# Patient Record
Sex: Female | Born: 1950
Health system: Southern US, Community
[De-identification: ages and names within clinical notes are randomized; demographics above are authoritative.]

## PROBLEM LIST (undated history)

## (undated) DIAGNOSIS — C801 Malignant (primary) neoplasm, unspecified: Secondary | ICD-10-CM

## (undated) DIAGNOSIS — J45909 Unspecified asthma, uncomplicated: Secondary | ICD-10-CM

## (undated) DIAGNOSIS — I10 Essential (primary) hypertension: Secondary | ICD-10-CM

## (undated) DIAGNOSIS — M1990 Unspecified osteoarthritis, unspecified site: Secondary | ICD-10-CM

## (undated) DIAGNOSIS — F419 Anxiety disorder, unspecified: Secondary | ICD-10-CM

## (undated) DIAGNOSIS — M87073 Idiopathic aseptic necrosis of unspecified ankle: Secondary | ICD-10-CM

## (undated) DIAGNOSIS — Z8701 Personal history of pneumonia (recurrent): Secondary | ICD-10-CM

## (undated) DIAGNOSIS — R269 Unspecified abnormalities of gait and mobility: Secondary | ICD-10-CM

## (undated) DIAGNOSIS — K219 Gastro-esophageal reflux disease without esophagitis: Secondary | ICD-10-CM

## (undated) DIAGNOSIS — H9190 Unspecified hearing loss, unspecified ear: Secondary | ICD-10-CM

## (undated) DIAGNOSIS — R600 Localized edema: Secondary | ICD-10-CM

## (undated) DIAGNOSIS — R413 Other amnesia: Secondary | ICD-10-CM

## (undated) DIAGNOSIS — R112 Nausea with vomiting, unspecified: Secondary | ICD-10-CM

## (undated) DIAGNOSIS — E785 Hyperlipidemia, unspecified: Secondary | ICD-10-CM

## (undated) DIAGNOSIS — N393 Stress incontinence (female) (male): Secondary | ICD-10-CM

## (undated) DIAGNOSIS — F32A Depression, unspecified: Secondary | ICD-10-CM

## (undated) DIAGNOSIS — Z9889 Other specified postprocedural states: Secondary | ICD-10-CM

## (undated) DIAGNOSIS — F329 Major depressive disorder, single episode, unspecified: Secondary | ICD-10-CM

## (undated) HISTORY — DX: Localized edema: R60.0

## (undated) HISTORY — PX: BELOW KNEE LEG AMPUTATION: SUR23

## (undated) HISTORY — DX: Hyperlipidemia, unspecified: E78.5

## (undated) HISTORY — DX: Unspecified abnormalities of gait and mobility: R26.9

## (undated) HISTORY — PX: CATARACT EXTRACTION W/ INTRAOCULAR LENS IMPLANT: SHX1309

## (undated) HISTORY — PX: TONSILLECTOMY: SUR1361

## (undated) HISTORY — DX: Other amnesia: R41.3

## (undated) HISTORY — PX: SKIN GRAFT: SHX250

## (undated) HISTORY — PX: ANKLE FUSION: SHX881

---

## 1981-06-06 HISTORY — PX: TUBAL LIGATION: SHX77

## 2008-06-06 HISTORY — PX: ANKLE SURGERY: SHX546

## 2009-06-06 HISTORY — PX: ANKLE FUSION: SHX881

## 2010-09-08 ENCOUNTER — Ambulatory Visit (INDEPENDENT_AMBULATORY_CARE_PROVIDER_SITE_OTHER): Payer: PRIVATE HEALTH INSURANCE | Admitting: Cardiovascular Disease

## 2010-09-08 ENCOUNTER — Encounter: Payer: Self-pay | Admitting: Cardiovascular Disease

## 2010-09-08 VITALS — BP 102/74 | HR 88 | Resp 18 | Ht 68.0 in | Wt 195.0 lb

## 2010-09-08 DIAGNOSIS — R609 Edema, unspecified: Secondary | ICD-10-CM

## 2010-09-08 DIAGNOSIS — R600 Localized edema: Secondary | ICD-10-CM

## 2010-09-08 NOTE — Assessment & Plan Note (Signed)
The patient has asymmetric lower extremity edema involving only the left leg. I think the most likely etiology is either venous insufficiency or chronic DVT following surgery. The patient does not recall a specific event where she had left calf pain, but chronic DVT is possible considering her previous operations. Will check a left leg venous duplex scan to evaluate for both disorders. Since she has some systemic symptoms including diffuse joint aches and rash, will check a sed rate, ANA, and rheumatoid factor. We'll followup with the patient after the studies are completed. If she has venous insufficiency we'll likely recommend leg elevation and compression stockings.

## 2010-09-08 NOTE — Patient Instructions (Signed)
Your physician recommends that you schedule a follow-up appointment as needed. Your physician has requested that you have a lower or upper extremity venous duplex. This test is an ultrasound of the veins in the legs or arms. It looks at venous blood flow that carries blood from the heart to the legs or arms. Allow one hour for a Lower Venous exam. Allow thirty minutes for an Upper Venous exam. There are no restrictions or special instructions.

## 2010-09-08 NOTE — Progress Notes (Signed)
HPI:  This is a 60 year old woman presented for initial evaluation of left leg swelling. The patient first noted left leg swelling in 2010 following surgery on her right ankle. She has had mild chronic swelling of the left leg since that time. However, she notes progressive swelling of the left lower leg over the past 2 months. She has also developed a pruritic rash limited to the left lower leg.  She has been diagnosed with avascular necrosis of the right ankle has undergone 2 surgeries, first in 2010 and then a second surgery in 2011. She is going to require a third surgery in the near future. She has had some mild swelling of the right ankle around the time of surgery but otherwise has not had significant swelling in the right leg. The patient has not undergone any evaluation to this point for her left leg swelling. She denies pain in the left leg. She specifically denies calf pain with ambulation. She denies any swelling above the knee. She denies shortness of breath, orthopnea, or PND. She has had no chest pain. Her only other complaint is that of diffuse aches and pains. She denies fevers, chills, anorexia, or weight loss. She has had some night sweats but attributes this to discontinuation of hormone replacement therapy.  Outpatient Encounter Prescriptions as of 09/08/2010  Medication Sig Dispense Refill  . ascorbic acid (VITAMIN C) 500 MG tablet Take 1,000 mg by mouth daily.        Marland Kitchen buPROPion (WELLBUTRIN XL) 300 MG 24 hr tablet Take 300 mg by mouth daily.        . Calcium Carbonate-Vitamin D (RA CALCIUM PLUS VITAMIN D) 600-400 MG-UNIT per tablet Take 1 tablet by mouth 2 (two) times daily.        . CASCARA SAGRADA PO Take 2 capsules by mouth daily.        . cetirizine (ZYRTEC) 10 MG chewable tablet Chew 10 mg by mouth daily.        . Ergocalciferol (VITAMIN D2 PO) Take by mouth. Take 1.25mg  (50k UN) once a week       . folic acid (FOLVITE) 800 MCG tablet Take 800 mcg by mouth daily.        Marland Kitchen  L-Arginine 1000 MG TABS Take 2 tablets by mouth daily.        Marland Kitchen L-Lysine 1000 MG TABS Take 1 tablet by mouth daily.        . montelukast (SINGULAIR) 10 MG tablet Take 10 mg by mouth as needed.        . Multiple Vitamins-Minerals (CENTRUM SILVER PO) Take 1 tablet by mouth daily.        . Omega-3 Fatty Acids (FISH OIL BURP-LESS) 1200 MG CAPS Take 2 capsules by mouth daily.        . Potassium 99 MG TABS Take 1 tablet by mouth daily.        . simvastatin (ZOCOR) 20 MG tablet Take 20 mg by mouth at bedtime.        . vitamin B-12 (CYANOCOBALAMIN) 1000 MCG tablet Take 1,000 mcg by mouth daily.          Review of patient's allergies indicates no known allergies.  Past Medical History  Diagnosis Date  . Hyperlipidemia   . Leg edema     left  . Edema of foot     bilateral    Past Surgical History  Procedure Date  . Tonsillectomy   . Tubal ligation 1983  . Ankle  surgery 2010    right cordicompression  . Ankle fusion 2011    right    History   Social History  . Marital Status: Divorced    Spouse Name: N/A    Number of Children: 3  . Years of Education: N/A   Occupational History  . ACCOUNTING    Social History Main Topics  . Smoking status: Former Smoker    Types: Cigarettes    Quit date: 02/05/2007  . Smokeless tobacco: Not on file  . Alcohol Use: No  . Drug Use: No  . Sexually Active: Not on file   Other Topics Concern  . Not on file   Social History Narrative  . No narrative on file    Family History  Problem Relation Age of Onset  . Dementia Mother 76    alive  . Fibromyalgia Mother   . Heart attack Father 46    deceased  . Other Brother     alive  . Heart murmur Sister 14    she had open heart surgery  . Other Sister 3    She is bypolar- diabetic    ROS: General: no fevers/chills, positive for night sweats Eyes: no blurry vision, diplopia, or amaurosis ENT: no sore throat or hearing loss Resp: no cough, wheezing, or hemoptysis CV: no edema or  palpitations GI: no abdominal pain, nausea, vomiting, diarrhea, or constipation GU: no dysuria, frequency, or hematuria Skin: positive for left leg rash Neuro: no headache, numbness, tingling, or weakness of extremities Musculoskeletal: positive for joint pains Heme: no bleeding, DVT, or easy bruising Endo: no polydipsia or polyuria  BP 102/74  Pulse 88  Resp 18  Ht 5\' 8"  (1.727 m)  Wt 195 lb (88.451 kg)  BMI 29.65 kg/m2  PHYSICAL EXAM: Pt is alert and oriented, very pleasant woman, WD, WN, in no distress. HEENT: normal Neck: JVP normal. Carotid upstrokes normal without bruits. No thyromegaly. Negative HJR Lungs: equal expansion, clear bilaterally CV: Apex is discrete and nondisplaced, RRR without murmur or gallop Abd: soft, NT, +BS, no bruit, no hepatosplenomegaly Back: no CVA tenderness Ext: There is 2+ pitting edema of the left pretibial region and ankle. The right leg has no significant edema. There is muscle wasting of the right calf.        Femoral pulses 2+= without bruits        DP/PT pulses intact and = Skin: there are well-circumscribed areas of rash on the left lower extremity below the knee without surrounding erythema Neuro: CNII-XII intact             Strength intact = bilaterally  LABS: White blood cell count 5.7, hemoglobin 13.7, platelets 247, differential is normal, glucose 83 creatinine 0.8, serum albumin 4.3, liver function testing is normal  ASSESSMENT AND PLAN:

## 2010-09-09 LAB — URINALYSIS, ROUTINE W REFLEX MICROSCOPIC
Bilirubin Urine: NEGATIVE
Ketones, ur: NEGATIVE
Nitrite: NEGATIVE
Specific Gravity, Urine: 1.01 (ref 1.000–1.030)
Total Protein, Urine: NEGATIVE
Urine Glucose: NEGATIVE
Urobilinogen, UA: 0.2 (ref 0.0–1.0)
pH: 5.5 (ref 5.0–8.0)

## 2010-09-09 LAB — ANA: Anti Nuclear Antibody(ANA): NEGATIVE

## 2010-09-09 LAB — BRAIN NATRIURETIC PEPTIDE: Pro B Natriuretic peptide (BNP): 9.7 pg/mL (ref 0.0–100.0)

## 2010-09-09 LAB — RHEUMATOID FACTOR: Rhuematoid fact SerPl-aCnc: <10 IU/mL (ref <=14)

## 2010-09-09 LAB — SEDIMENTATION RATE: Sed Rate: 10 mm/hr (ref 0–22)

## 2010-09-15 ENCOUNTER — Encounter (INDEPENDENT_AMBULATORY_CARE_PROVIDER_SITE_OTHER): Payer: PRIVATE HEALTH INSURANCE | Admitting: *Deleted

## 2010-09-15 DIAGNOSIS — R600 Localized edema: Secondary | ICD-10-CM

## 2010-09-15 DIAGNOSIS — M79609 Pain in unspecified limb: Secondary | ICD-10-CM

## 2010-09-15 DIAGNOSIS — M7989 Other specified soft tissue disorders: Secondary | ICD-10-CM

## 2010-09-20 ENCOUNTER — Encounter: Payer: Self-pay | Admitting: Cardiovascular Disease

## 2010-09-20 ENCOUNTER — Telehealth: Payer: Self-pay | Admitting: Cardiovascular Disease

## 2010-09-20 NOTE — Telephone Encounter (Signed)
Pt is at low risk of cardiovascular problems with further orthopedic surgeries and does not require further testing at this time.

## 2010-09-20 NOTE — Telephone Encounter (Signed)
I spoke with the Shelly [name] and made her aware of labs, UA and LEV results.  The Shelly [name] would like a copy of these tests mailed to her home.  The Shelly [name] said she is also due for additional surgery with Dr Hillery Hunter at [facility] and the Shelly [name]'s needs Dr Excell Seltzer to give her clearance.  I will forward this message to Dr Excell Seltzer for review.

## 2010-09-20 NOTE — Telephone Encounter (Signed)
Pt calling re results °

## 2010-09-21 NOTE — Telephone Encounter (Signed)
I spoke with the pt and made her aware of Dr Earmon Phoenix recommendation for future surgery.  The pt also said that [facility] requested that she have a CT scan done and that they were going to mail her an order.  The pt was wondering where she could get this test performed.  I made her aware that she could contact [facility] about scheduling CT scan. I will fax this telephone note and last OV note to Dr Zachery Dakins.

## 2010-09-30 ENCOUNTER — Other Ambulatory Visit: Payer: Self-pay | Admitting: *Deleted

## 2010-09-30 DIAGNOSIS — M25571 Pain in right ankle and joints of right foot: Secondary | ICD-10-CM

## 2010-10-01 ENCOUNTER — Ambulatory Visit
Admission: RE | Admit: 2010-10-01 | Discharge: 2010-10-01 | Disposition: A | Discharge status: Home / Self Care | Payer: PRIVATE HEALTH INSURANCE | Source: Ambulatory Visit | Attending: *Deleted | Admitting: *Deleted

## 2010-10-01 DIAGNOSIS — M25571 Pain in right ankle and joints of right foot: Secondary | ICD-10-CM

## 2010-10-22 ENCOUNTER — Telehealth: Payer: Self-pay | Admitting: Cardiovascular Disease

## 2010-10-22 NOTE — Telephone Encounter (Signed)
Records faxed to [facility] @ [phone], lt also asked for records to be sent to her home, I called and left Message with her the ROI on file is for [place] to only send records over to [facility], not to her.  10/22/10/km

## 2010-11-08 ENCOUNTER — Ambulatory Visit (HOSPITAL_COMMUNITY)
Admission: RE | Admit: 2010-11-08 | Discharge: 2010-11-08 | Disposition: A | Discharge status: Home / Self Care | Payer: PRIVATE HEALTH INSURANCE | Source: Ambulatory Visit | Attending: Rheumatology | Admitting: Rheumatology

## 2010-11-08 ENCOUNTER — Other Ambulatory Visit (HOSPITAL_COMMUNITY): Payer: Self-pay | Admitting: Rheumatology

## 2010-11-08 DIAGNOSIS — D869 Sarcoidosis, unspecified: Secondary | ICD-10-CM

## 2010-11-08 DIAGNOSIS — R52 Pain, unspecified: Secondary | ICD-10-CM | POA: Insufficient documentation

## 2011-04-11 DIAGNOSIS — M869 Osteomyelitis, unspecified: Secondary | ICD-10-CM | POA: Insufficient documentation

## 2011-04-11 DIAGNOSIS — M797 Fibromyalgia: Secondary | ICD-10-CM | POA: Insufficient documentation

## 2011-04-11 DIAGNOSIS — M87073 Idiopathic aseptic necrosis of unspecified ankle: Secondary | ICD-10-CM | POA: Insufficient documentation

## 2011-04-11 DIAGNOSIS — E785 Hyperlipidemia, unspecified: Secondary | ICD-10-CM | POA: Insufficient documentation

## 2011-10-21 DIAGNOSIS — Z981 Arthrodesis status: Secondary | ICD-10-CM | POA: Insufficient documentation

## 2012-02-13 DIAGNOSIS — Z973 Presence of spectacles and contact lenses: Secondary | ICD-10-CM | POA: Insufficient documentation

## 2012-02-17 DIAGNOSIS — E669 Obesity, unspecified: Secondary | ICD-10-CM | POA: Insufficient documentation

## 2012-02-18 DIAGNOSIS — G8918 Other acute postprocedural pain: Secondary | ICD-10-CM | POA: Insufficient documentation

## 2012-04-27 ENCOUNTER — Other Ambulatory Visit (HOSPITAL_COMMUNITY): Payer: Self-pay | Admitting: Physician Assistant

## 2012-04-27 DIAGNOSIS — Z1231 Encounter for screening mammogram for malignant neoplasm of breast: Secondary | ICD-10-CM

## 2012-05-08 ENCOUNTER — Ambulatory Visit: Payer: PRIVATE HEALTH INSURANCE | Attending: Family Medicine | Admitting: Physical Therapy

## 2012-05-08 DIAGNOSIS — R269 Unspecified abnormalities of gait and mobility: Secondary | ICD-10-CM | POA: Insufficient documentation

## 2012-05-08 DIAGNOSIS — R5381 Other malaise: Secondary | ICD-10-CM | POA: Insufficient documentation

## 2012-05-08 DIAGNOSIS — S88119A Complete traumatic amputation at level between knee and ankle, unspecified lower leg, initial encounter: Secondary | ICD-10-CM | POA: Insufficient documentation

## 2012-05-08 DIAGNOSIS — IMO0001 Reserved for inherently not codable concepts without codable children: Secondary | ICD-10-CM | POA: Insufficient documentation

## 2012-05-09 ENCOUNTER — Ambulatory Visit: Payer: PRIVATE HEALTH INSURANCE | Admitting: Physical Therapy

## 2012-05-16 ENCOUNTER — Ambulatory Visit (HOSPITAL_COMMUNITY)
Admission: RE | Admit: 2012-05-16 | Discharge: 2012-05-16 | Disposition: A | Discharge status: Home / Self Care | Payer: PRIVATE HEALTH INSURANCE | Source: Ambulatory Visit | Attending: Physician Assistant | Admitting: Physician Assistant

## 2012-05-16 DIAGNOSIS — Z1231 Encounter for screening mammogram for malignant neoplasm of breast: Secondary | ICD-10-CM | POA: Insufficient documentation

## 2012-05-17 ENCOUNTER — Ambulatory Visit: Payer: PRIVATE HEALTH INSURANCE | Admitting: Physical Therapy

## 2012-05-18 ENCOUNTER — Ambulatory Visit: Payer: PRIVATE HEALTH INSURANCE | Admitting: *Deleted

## 2012-05-23 ENCOUNTER — Ambulatory Visit: Payer: PRIVATE HEALTH INSURANCE | Admitting: Physical Therapy

## 2012-05-24 ENCOUNTER — Ambulatory Visit: Payer: PRIVATE HEALTH INSURANCE | Admitting: Physical Therapy

## 2012-05-28 ENCOUNTER — Ambulatory Visit: Payer: PRIVATE HEALTH INSURANCE | Admitting: Physical Therapy

## 2012-05-31 ENCOUNTER — Encounter: Payer: PRIVATE HEALTH INSURANCE | Admitting: Physical Therapy

## 2012-06-04 ENCOUNTER — Ambulatory Visit: Payer: PRIVATE HEALTH INSURANCE | Admitting: Physical Therapy

## 2012-06-05 ENCOUNTER — Encounter: Payer: PRIVATE HEALTH INSURANCE | Admitting: Physical Therapy

## 2012-06-13 ENCOUNTER — Ambulatory Visit: Payer: PRIVATE HEALTH INSURANCE | Attending: Family Medicine | Admitting: Physical Therapy

## 2012-06-13 DIAGNOSIS — R269 Unspecified abnormalities of gait and mobility: Secondary | ICD-10-CM | POA: Insufficient documentation

## 2012-06-13 DIAGNOSIS — R5381 Other malaise: Secondary | ICD-10-CM | POA: Insufficient documentation

## 2012-06-13 DIAGNOSIS — IMO0001 Reserved for inherently not codable concepts without codable children: Secondary | ICD-10-CM | POA: Insufficient documentation

## 2012-06-13 DIAGNOSIS — S88119A Complete traumatic amputation at level between knee and ankle, unspecified lower leg, initial encounter: Secondary | ICD-10-CM | POA: Insufficient documentation

## 2012-06-20 ENCOUNTER — Ambulatory Visit: Payer: PRIVATE HEALTH INSURANCE | Admitting: Physical Therapy

## 2012-06-21 ENCOUNTER — Ambulatory Visit: Payer: PRIVATE HEALTH INSURANCE | Admitting: Physical Therapy

## 2012-06-25 ENCOUNTER — Ambulatory Visit: Payer: PRIVATE HEALTH INSURANCE | Admitting: Physical Therapy

## 2012-06-26 ENCOUNTER — Ambulatory Visit: Payer: PRIVATE HEALTH INSURANCE | Admitting: Physical Therapy

## 2012-07-04 ENCOUNTER — Ambulatory Visit: Payer: PRIVATE HEALTH INSURANCE | Admitting: Physical Therapy

## 2012-07-05 ENCOUNTER — Ambulatory Visit: Payer: PRIVATE HEALTH INSURANCE | Admitting: Physical Therapy

## 2012-08-16 ENCOUNTER — Other Ambulatory Visit (HOSPITAL_COMMUNITY)
Admission: RE | Admit: 2012-08-16 | Discharge: 2012-08-16 | Disposition: A | Discharge status: Home / Self Care | Payer: PRIVATE HEALTH INSURANCE | Source: Ambulatory Visit | Attending: Obstetrics and Gynecology | Admitting: Obstetrics and Gynecology

## 2012-08-16 DIAGNOSIS — Z01419 Encounter for gynecological examination (general) (routine) without abnormal findings: Secondary | ICD-10-CM | POA: Insufficient documentation

## 2012-08-16 DIAGNOSIS — Z1151 Encounter for screening for human papillomavirus (HPV): Secondary | ICD-10-CM | POA: Insufficient documentation

## 2012-12-18 ENCOUNTER — Other Ambulatory Visit: Payer: Self-pay | Admitting: Obstetrics and Gynecology

## 2012-12-18 DIAGNOSIS — N6315 Unspecified lump in the right breast, overlapping quadrants: Secondary | ICD-10-CM

## 2013-01-01 ENCOUNTER — Ambulatory Visit
Admission: RE | Admit: 2013-01-01 | Discharge: 2013-01-01 | Disposition: A | Discharge status: Home / Self Care | Payer: PRIVATE HEALTH INSURANCE | Source: Ambulatory Visit | Attending: Obstetrics and Gynecology | Admitting: Obstetrics and Gynecology

## 2013-01-01 ENCOUNTER — Other Ambulatory Visit: Payer: PRIVATE HEALTH INSURANCE

## 2013-01-01 DIAGNOSIS — N6315 Unspecified lump in the right breast, overlapping quadrants: Secondary | ICD-10-CM

## 2013-04-29 ENCOUNTER — Ambulatory Visit (INDEPENDENT_AMBULATORY_CARE_PROVIDER_SITE_OTHER): Payer: PRIVATE HEALTH INSURANCE | Admitting: Podiatry

## 2013-04-29 ENCOUNTER — Encounter: Payer: Self-pay | Admitting: Podiatry

## 2013-04-29 VITALS — BP 123/55 | HR 86 | Resp 16 | Ht 68.0 in | Wt 193.0 lb

## 2013-04-29 DIAGNOSIS — L600 Ingrowing nail: Secondary | ICD-10-CM

## 2013-04-29 NOTE — Progress Notes (Signed)
  Subjective:    Patient ID: Shelly [name], female    DOB: 1950-12-25, 62 y.o.   MRN: [id number]  HPI    Review of Systems  All other systems reviewed and are negative.       Objective:   Physical Exam        Assessment & Plan:

## 2013-04-29 NOTE — Progress Notes (Signed)
N-sore L-lt foot 2nd and toenail D-4 months  O-slowly C-worse A -pressure T-trim

## 2013-04-29 NOTE — Patient Instructions (Signed)

## 2013-04-30 NOTE — Progress Notes (Signed)
Subjective:     Patient ID: Shelly [name], female   DOB: 22-Apr-1951, 63 y.o.   MRN: [id number]  HPI patient presents stating I'm having trouble with 2 of my toenails and I have trouble with my root foot that Dr. Victorino Dike is taking care of. Patient points to the left second and third toenails stating that they are sore when pressed. Patient had avn necrosis of her right talar [place] required numerous surgeries and eventual below the knee amputation of her right leg   Review of Systems  All other systems reviewed and are negative.       Objective:   Physical Exam  Nursing note and vitals reviewed. Constitutional: She is oriented to person, place, and time.  Cardiovascular: Intact distal pulses.   Musculoskeletal: Normal range of motion.  Neurological: She is oriented to person, place, and time.  Skin: Skin is warm.   patient's left foot shows good neurological and vascular status was normal muscle strength and a significant flat foot for which and orthotic is being made. Quite a bit of pain in the sinus tarsi and ankle joint noted which is being treated again by Dr. Victorino Dike. incurvated nail beds left second and third toes lateral border with elongation of the toes part of the problem    Assessment:     Ingrown toenail deformity left second and third toe lateral border with structural deformity part of the contributing problem    Plan:     H&P performed and discussion concerning toenails and toes occurred. I have recommended removing the corners of the nails and hoping that this will solve her problem with the possibility that some day she may require digital shortening procedures if she continues to have persistent pain in the ends of her toes. Patient understands this and wants to pursue this course of action and understands risk. Today I infiltrated 120 mg Xylocaine Marcaine mixture and removed the lateral borders of the left second and third nails exposed matrix and applied 3 phenol  applications to each border followed by alcohol lavaged and sterile dressings. Patient is getting instructions on postoperative soaks and will return for [place] to check

## 2014-01-31 ENCOUNTER — Other Ambulatory Visit: Payer: Self-pay

## 2014-01-31 DIAGNOSIS — Z1231 Encounter for screening mammogram for malignant neoplasm of breast: Secondary | ICD-10-CM

## 2014-02-06 ENCOUNTER — Ambulatory Visit
Admission: RE | Admit: 2014-02-06 | Discharge: 2014-02-06 | Disposition: A | Discharge status: Home / Self Care | Payer: PRIVATE HEALTH INSURANCE | Source: Ambulatory Visit

## 2014-02-06 ENCOUNTER — Encounter (INDEPENDENT_AMBULATORY_CARE_PROVIDER_SITE_OTHER): Payer: Self-pay

## 2014-02-06 DIAGNOSIS — Z1231 Encounter for screening mammogram for malignant neoplasm of breast: Secondary | ICD-10-CM

## 2014-05-15 ENCOUNTER — Encounter: Payer: Self-pay | Admitting: Internal Medicine

## 2014-05-15 ENCOUNTER — Ambulatory Visit (INDEPENDENT_AMBULATORY_CARE_PROVIDER_SITE_OTHER)
Admission: RE | Admit: 2014-05-15 | Discharge: 2014-05-15 | Disposition: A | Discharge status: Home / Self Care | Payer: Commercial Managed Care - PPO | Source: Ambulatory Visit | Attending: Internal Medicine | Admitting: Internal Medicine

## 2014-05-15 ENCOUNTER — Ambulatory Visit (INDEPENDENT_AMBULATORY_CARE_PROVIDER_SITE_OTHER): Payer: Commercial Managed Care - PPO | Admitting: Internal Medicine

## 2014-05-15 VITALS — BP 140/82 | HR 90 | Ht 68.0 in | Wt 207.0 lb

## 2014-05-15 DIAGNOSIS — R938 Abnormal findings on diagnostic imaging of other specified body structures: Secondary | ICD-10-CM

## 2014-05-15 DIAGNOSIS — R05 Cough: Secondary | ICD-10-CM

## 2014-05-15 DIAGNOSIS — R058 Other specified cough: Secondary | ICD-10-CM

## 2014-05-15 DIAGNOSIS — R9389 Abnormal findings on diagnostic imaging of other specified body structures: Secondary | ICD-10-CM

## 2014-05-15 DIAGNOSIS — J45991 Cough variant asthma: Secondary | ICD-10-CM | POA: Insufficient documentation

## 2014-05-15 MED ORDER — FAMOTIDINE 20 MG PO TABS: ORAL_TABLET | ORAL | Status: DC

## 2014-05-15 MED ORDER — TRAMADOL HCL 50 MG PO TABS: ORAL_TABLET | ORAL | Status: DC

## 2014-05-15 MED ORDER — PREDNISONE 10 MG PO TABS: ORAL_TABLET | ORAL | Status: DC

## 2014-05-15 NOTE — Progress Notes (Signed)
Subjective:    Patient ID: Shelly [name], female    DOB: 22-Aug-1950,     MRN: [id number]  HPI   45 yowf quit smoking 2008 water eyes in her 6s year round ? Worse in winter eventually started on zyrtec 2013 and seemed to help s assoc cough/ wheeze then new onset cough Aug 2015 and proved refractory so referred to pulmonary clinic by Dr Jeremy Johann   05/15/2014 1st [facility] office visit/ Azaliyah Kennard   Chief Complaint  Patient presents with  . Pulmonary Consult    Referred by Dr. Carol Ada. Pt c/o cough since August 2015- cough is non prod and somtimes seems worse at night. She also c/o DOE with walking up stairs.    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                            With trouble breathing? Not now   Do you have choking episodes when you cannot  Get enough air, gasping for air ?              Yes   Do you usually cough when you lie down into  The bed, or when you just lie down to rest ?                          Yes   Do you usually cough after meals or eating?         Not sure   Do you cough when (or after) you bend over?    Yes   GERD SCORE     Kouffman Reflux v Neurogenic Cough Differentiator Neurogenic   Do you more-or-less cough all day long? Off and on   Does change of temperature make you cough? no   Does laughing or chuckling cause you to cough? yes   Do fumes (perfume, automobile fumes, burned  Toast, etc.,) cause you to cough ?      no   Does speaking, singing, or talking on the phone cause you to cough   ?                Yes    Neurogenic/Airway score      No obvious other patterns in day to day or daytime variabilty or assoc  cp or chest tightness, subjective wheeze overt sinus or hb symptoms. No unusual exp hx or h/o childhood pna/ asthma or knowledge of premature birth.  Sleeping ok without nocturnal  or early am exacerbation  of respiratory  c/o's or need for noct saba. Also denies any obvious fluctuation of symptoms with weather or environmental changes or other aggravating or alleviating factors except as outlined above   Current Medications, Allergies, Complete Past Medical History, Past Surgical History, Family History, and Social History were reviewed in [employer] record.           Review of Systems  Constitutional: Negative for fever, chills  and unexpected weight change.  HENT: Negative for congestion, dental problem, ear pain, nosebleeds, postnasal drip, rhinorrhea, sinus pressure, sneezing, sore throat, trouble swallowing and voice change.   Eyes: Negative for visual disturbance.  Respiratory: Positive for cough and shortness of breath. Negative for choking.   Cardiovascular: Negative for chest pain and leg swelling.  Gastrointestinal: Negative for vomiting, abdominal pain and diarrhea.  Genitourinary: Negative for difficulty urinating.       Acid Heartburn  Musculoskeletal: Positive for arthralgias.  Skin: Negative for rash.  Neurological: Positive for headaches. Negative for tremors and syncope.  Hematological: Does not bruise/bleed easily.       Objective:   Physical Exam  Wt Readings from Last 3 Encounters:  05/15/14 207 lb (93.895 kg)  04/29/13 193 lb (87.544 kg)  09/08/10 195 lb (88.451 kg)    Vital signs reviewed  HEENT: nl dentition, turbinates, and orophanx. Nl external ear canals without cough reflex   NECK :  without JVD/Nodes/TM/ nl carotid upstrokes bilaterally   LUNGS: no acc muscle use, clear to A and P bilaterally without cough  on insp or exp maneuvers   CV:  RRR  no s3 or murmur or increase in P2, no edema   ABD:  soft and nontender with nl excursion in the supine position. No bruits or organomegaly, bowel sounds nl  MS:  warm without deformities, calf tenderness, cyanosis or clubbing  SKIN: warm and dry without lesions    NEURO:  alert, approp, no deficits    CXR PA and Lateral:   05/15/2014 : 1. Left lower lobe mild atelectasis and/or infiltrate. Associated elevation left hemidiaphragm. 2. Stable calcified nodule left lung base consistent with granuloma.      Assessment & Plan:

## 2014-05-15 NOTE — Patient Instructions (Addendum)
The key to effective treatment for your cough is eliminating the non-stop cycle of cough you're stuck in long enough to let your airway heal completely and then see if there is anything still making you cough once you stop the cough suppression, but this should take no more than 5 days to figure out  First take delsym two tsp every 12 hours and supplement if needed with  tramadol 50 mg up to 2 every 4 hours to suppress the urge to cough at all or even clear your throat. Swallowing water or using ice chips/non mint and menthol containing candies (such as lifesavers or sugarless jolly ranchers) are also effective.  You should rest your voice and avoid activities that you know make you cough.  Once you have eliminated the cough for 3 straight days try reducing the tramadol first,  then the delsym as tolerated.    Prednisone 10 mg take  4 each am x 2 days,   2 each am x 2 days,  1 each am x 2 days and stop (this is to eliminate allergies and inflammation from coughing)  Omeprazole 40 mg  Take 30-60 min before first meal of the day and Pepcid 20 mg one bedtime plus chlorpheniramine 4 mg x 2 at bedtime (both available over the counter)  until cough is completely gone for at least a week without the need for cough suppression  GERD (REFLUX)  is an extremely common cause of respiratory symptoms, many times with no significant heartburn at all.    It can be treated with medication, but also with lifestyle changes including avoidance of late meals, excessive alcohol, smoking cessation, and avoid fatty foods, chocolate, peppermint, colas, red wine, and acidic juices such as orange juice.  NO MINT OR MENTHOL PRODUCTS SO NO COUGH DROPS  USE HARD CANDY INSTEAD (jolley ranchers or Stover's or Lifesavers (all available in sugarless versions) NO OIL BASED VITAMINS - use powdered substitutes.   If not better return in 2 weeks

## 2014-05-16 DIAGNOSIS — R9389 Abnormal findings on diagnostic imaging of other specified body structures: Secondary | ICD-10-CM | POA: Insufficient documentation

## 2014-05-16 NOTE — Assessment & Plan Note (Addendum)
Doubt this is related to the cough though could be and note prev smoking hx with ddx primary atx vs paralyzed L HD which is typically idiopathic.   Discussed with pt who says her chiropractor did xrays and showed the same abn (elevated L HD) but her baseline cxr is nl  11/08/10 so rec f/u p holidays with repeat cxr vs CT chest /sinus/fluoro if still coughing p 2 weeks rx as per instructions

## 2014-05-16 NOTE — Assessment & Plan Note (Signed)
The most common causes of chronic cough in immunocompetent adults include the following: upper airway cough syndrome (UACS), previously referred to as postnasal drip syndrome (PNDS), which is caused by variety of rhinosinus conditions; (2) asthma; (3) GERD; (4) chronic bronchitis from cigarette smoking or other inhaled environmental irritants; (5) nonasthmatic eosinophilic bronchitis; and (6) bronchiectasis.   These conditions, singly or in combination, have accounted for up to 94% of the causes of chronic cough in prospective studies.   Other conditions have constituted no >6% of the causes in prospective studies These have included bronchogenic carcinoma, chronic interstitial pneumonia, sarcoidosis, left ventricular failure, ACEI-induced cough, and aspiration from a condition associated with pharyngeal dysfunction.    Chronic cough is often simultaneously caused by more than one condition. A single cause has been found from 38 to 82% of the time, multiple causes from 18 to 62%. Multiply caused cough has been the result of three diseases up to 42% of the time.       Based on hx and exam, this is most likely:  Classic Upper airway cough syndrome, so named because it's frequently impossible to sort out how much is  CR/sinusitis with freq throat clearing (which can be related to primary GERD)   vs  causing  secondary (" extra esophageal")  GERD from wide swings in gastric pressure that occur with throat clearing, often  promoting self use of mint and menthol lozenges that reduce the lower esophageal sphincter tone and exacerbate the problem further in a cyclical fashion.   These are the same pts (now being labeled as having "irritable larynx syndrome" by some cough centers) who not infrequently have a history of having failed to tolerate ace inhibitors,  dry powder inhalers or biphosphonates or report having atypical reflux symptoms that don't respond to standard doses of PPI , and are easily confused as  having aecopd or asthma flares by even experienced allergists/ pulmonologists.   The first step is to maximize acid suppression and eliminate cyclical coughing then regroup if the cough persists in 2 weeks  See instructions for specific recommendations which were reviewed directly with the patient who was given a copy with highlighter outlining the key components.

## 2014-05-27 ENCOUNTER — Encounter: Payer: Self-pay | Admitting: Internal Medicine

## 2014-05-27 ENCOUNTER — Telehealth: Payer: Self-pay | Admitting: Internal Medicine

## 2014-05-27 NOTE — Telephone Encounter (Signed)
Called pt. She was calling to schedule an acute visit with MW. Nothing further needed

## 2014-05-28 ENCOUNTER — Other Ambulatory Visit (INDEPENDENT_AMBULATORY_CARE_PROVIDER_SITE_OTHER): Payer: Commercial Managed Care - PPO

## 2014-05-28 ENCOUNTER — Ambulatory Visit (INDEPENDENT_AMBULATORY_CARE_PROVIDER_SITE_OTHER): Payer: Commercial Managed Care - PPO | Admitting: Internal Medicine

## 2014-05-28 ENCOUNTER — Encounter: Payer: Self-pay | Admitting: Internal Medicine

## 2014-05-28 DIAGNOSIS — R600 Localized edema: Secondary | ICD-10-CM

## 2014-05-28 DIAGNOSIS — R9389 Abnormal findings on diagnostic imaging of other specified body structures: Secondary | ICD-10-CM

## 2014-05-28 DIAGNOSIS — R05 Cough: Secondary | ICD-10-CM

## 2014-05-28 DIAGNOSIS — R058 Other specified cough: Secondary | ICD-10-CM

## 2014-05-28 DIAGNOSIS — R938 Abnormal findings on diagnostic imaging of other specified body structures: Secondary | ICD-10-CM

## 2014-05-28 LAB — CBC WITH DIFFERENTIAL/PLATELET
Basophils Absolute: 0 10*3/uL (ref 0.0–0.1)
Basophils Relative: 0.4 % (ref 0.0–3.0)
Eosinophils Absolute: 0.3 10*3/uL (ref 0.0–0.7)
Eosinophils Relative: 4.3 % (ref 0.0–5.0)
HCT: 41.5 % (ref 36.0–46.0)
Hemoglobin: 13.7 g/dL (ref 12.0–15.0)
Lymphocytes Relative: 22.8 % (ref 12.0–46.0)
Lymphs Abs: 1.6 10*3/uL (ref 0.7–4.0)
MCHC: 33 g/dL (ref 30.0–36.0)
MCV: 93.3 fl (ref 78.0–100.0)
Monocytes Absolute: 0.6 10*3/uL (ref 0.1–1.0)
Monocytes Relative: 9.3 % (ref 3.0–12.0)
Neutro Abs: 4.4 10*3/uL (ref 1.4–7.7)
Neutrophils Relative %: 63.2 % (ref 43.0–77.0)
Platelets: 238 10*3/uL (ref 150.0–400.0)
RBC: 4.45 Mil/uL (ref 3.87–5.11)
RDW: 12.9 % (ref 11.5–15.5)
WBC: 6.9 10*3/uL (ref 4.0–10.5)

## 2014-05-28 LAB — BASIC METABOLIC PANEL
BUN: 15 mg/dL (ref 6–23)
CO2: 27 mEq/L (ref 19–32)
Calcium: 9 mg/dL (ref 8.4–10.5)
Chloride: 104 mEq/L (ref 96–112)
Creatinine, Ser: 0.9 mg/dL (ref 0.4–1.2)
GFR: 68.42 mL/min (ref >60.00)
Glucose, Bld: 100 mg/dL — ABNORMAL HIGH (ref 70–99)
Potassium: 4.4 mEq/L (ref 3.5–5.1)
Sodium: 137 mEq/L (ref 135–145)

## 2014-05-28 MED ORDER — PREDNISONE 10 MG PO TABS: ORAL_TABLET | ORAL | Status: DC

## 2014-05-28 MED ORDER — BENZONATATE 200 MG PO CAPS: ORAL_CAPSULE | ORAL | Status: DC

## 2014-05-28 MED ORDER — TRAMADOL HCL 50 MG PO TABS: ORAL_TABLET | ORAL | Status: DC

## 2014-05-28 NOTE — Progress Notes (Signed)
Subjective:    Patient ID: Shelly [name], female    DOB: 1950-07-04       MRN: [id number]    Brief patient profile:  63 yowf quit smoking 2008 watery eyes in her 40s year round ? Worse in winter eventually started on zyrtec 2013 and seemed to help s assoc cough/ wheeze then new onset cough Aug 2015 and proved refractory so referred to pulmonary clinic 05/15/14  by Dr Jeremy Johann   05/15/2014 1st [facility] office visit/ Perley Arthurs   Chief Complaint  Patient presents with  . Pulmonary Consult    Referred by Dr. Carol Ada. Pt c/o cough since August 2015- cough is non prod and somtimes seems worse at night. She also c/o DOE with walking up stairs.    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   Prednisone 10 mg take  4 each am x 2 days,   2 each am x 2 days,  1 each am x 2 days and stop (this is to eliminate allergies and inflammation from coughing) Omeprazole 40 mg  Take 30-60 min before first meal of the day and Pepcid 20 mg one bedtime plus chlorpheniramine 4 mg x 2 at bedtime  GERD diet reviewed     05/28/2014 f/u ov/Jaise Moser re: cough since aug 2015 Chief Complaint  Patient presents with  . Acute Visit    Pt states that after last visit her cough had pretty much resolved, but then started back about 1 wk ago.  Cough is non prod and "comes and goes".   95% better, still urge to clear throat daytime on above rx/ confused about when to  d/c what  No noct cough  at all  Sob doe x steps better Then gradually worse x one week , again dry and variable      Kouffman Reflux v Neurogenic Cough Differentiator Reflux Comments  Do you awaken from a sound sleep coughing violently?                            With trouble breathing? Not now   Do you have choking episodes when you cannot  Get enough air, gasping for air ?              Not now   Do you usually cough when you lie down into  The bed, or when you just lie down to rest ?                          Not now    Do you usually cough after meals or eating?         No   Do you cough when (or after) you bend over?    Still some   GERD SCORE     Kouffman Reflux  v Neurogenic Cough Differentiator Neurogenic   Do you more-or-less cough all day long? no   Does change of temperature make you cough? no   Does laughing or chuckling cause you to cough? Still some   Do fumes (perfume, automobile fumes, burned  Toast, etc.,) cause you to cough ?      no   Does speaking, singing, or talking on the phone cause you to cough   ?               Not now   Neurogenic/Airway score         No obvious other patterns in  day to day or daytime variabilty or assoc  cp or chest tightness, subjective wheeze overt  hb symptoms. No unusual exp hx or h/o childhood pna/ asthma or knowledge of premature birth.  Sleeping ok without nocturnal  or early am exacerbation  of respiratory  c/o's or need for noct saba. Also denies any obvious fluctuation of symptoms with weather or environmental changes or other aggravating or alleviating factors except as outlined above   Current Medications, Allergies, Complete Past Medical History, Past Surgical History, Family History, and Social History were reviewed in [employer] record.  ROS  The following are not active complaints unless bolded sore throat, dysphagia, dental problems, itching, sneezing,  nasal congestion or excess watery  secretions/ purulent secretions, ear ache,   fever, chills, sweats, unintended wt loss, pleuritic or exertional cp, hemoptysis,  orthopnea pnd or leg swelling, presyncope, palpitations, heartburn, abdominal pain, anorexia, nausea, vomiting, diarrhea  or change in bowel or urinary habits, change in stools or urine, dysuria,hematuria,  rash, arthralgias, visual complaints, headache, numbness weakness or ataxia or problems with walking or coordination,  change in mood/affect or memory.                Objective:   Physical Exam  05/28/2014      208 Wt Readings from Last 3 Encounters:  05/15/14 207 lb (93.895 kg)  04/29/13 193 lb (87.544 kg)  09/08/10 195 lb (88.451 kg)    Vital signs reviewed  HEENT: nl dentition, turbinates, and orophanx. Nl external ear canals without cough reflex   NECK :  without JVD/Nodes/TM/ nl carotid upstrokes bilaterally   LUNGS: no acc muscle use, min decreased bs L base without wheeze or reproducible cough on insp or exp maneuvers   CV:  RRR  no s3 or murmur or increase in P2, no edema   ABD:  soft and nontender with nl excursion in the supine position. No bruits or organomegaly, bowel sounds nl  MS:  warm without deformities, calf tenderness, cyanosis or clubbing  SKIN: warm and dry without lesions    NEURO:  alert, approp, no deficits    CXR PA and Lateral:   05/15/2014 : 1. Left lower lobe mild atelectasis and/or infiltrate. Associated elevation left hemidiaphragm. 2. Stable calcified nodule left lung base consistent with granuloma.       Recent Labs Lab 05/28/14 0943  NA 137  K 4.4  CL 104  CO2 27  BUN 15  CREATININE 0.9  GLUCOSE 100*    Recent Labs Lab 05/28/14 0943  HGB 13.7  HCT 41.5  WBC 6.9  PLT 238.0        Assessment & Plan:

## 2014-05-28 NOTE — Progress Notes (Signed)
Quick Note:  Spoke with pt and notified of results per Dr. Wert. Pt verbalized understanding and denied any questions.  ______ 

## 2014-05-28 NOTE — Patient Instructions (Addendum)
Prednisone 10 mg take  4 each am x 2 days,   2 each am x 2 days,  1 each am x 2 days and stop   Take Tessalon 4x daily  and supplement if needed with  tramadol 50 mg up to 2 every 4 hours to suppress the urge to cough. Swallowing water or using ice chips/non mint and menthol containing candies (such as lifesavers or sugarless jolly ranchers) are also effective.  You should rest your voice and avoid activities that you know make you cough.  Once you have eliminated the cough for 3 straight days try reducing the tramadol first,  then the tessalon as tolerated  Please see patient coordinator before you leave today  to schedule sinus and chest CT and fluoro   Please remember to go to the lab  department downstairs for your tests - we will call you with the results when they are available.     Please schedule a follow up office visit in 2 weeks, sooner if needed

## 2014-05-28 NOTE — Assessment & Plan Note (Addendum)
-   allergy profile 05/28/2014 > No eos, IgE 424 POS RAST only dust and mold  - Sinus CT 05/28/2014 >   Was not able to completely eliminate cyclical cough the first go round  Will try combination of tessilon and tramadol this time and check  plus  Check sinus CT and add another 6 d of prednisone in case there is asthmatic/ allergic component     Each maintenance medication was reviewed in detail including most importantly the difference between maintenance and as needed and under what circumstances the prns are to be used.  Please see instructions for details which were reviewed in writing and the patient given a copy.

## 2014-05-28 NOTE — Assessment & Plan Note (Signed)
See CXR 05/15/14 with elevated L HD vs nl 11/08/10   Discussed with pt the ddx > needs CT chest and fluoro to sort out whether she has L phrenic nerve palsy vs atx of LLL as the primary problem - can't be congenital as nl in 11/08/10 as noted

## 2014-05-29 LAB — ALLERGY FULL PROFILE
Allergen, D pternoyssinus,d7: 0.2 kU/L — ABNORMAL HIGH
Allergen,Goose feathers, e70: <0.1 kU/L
Alternaria Alternata: <0.1 kU/L
Aspergillus fumigatus, m3: 4.09 kU/L — ABNORMAL HIGH
Bahia Grass: <0.1 kU/L
Bermuda Grass: <0.1 kU/L
Box Elder IgE: <0.1 kU/L
Candida Albicans: <0.1 kU/L
Cat Dander: <0.1 kU/L
Common Ragweed: <0.1 kU/L
Curvularia lunata: <0.1 kU/L
D. farinae: 0.13 kU/L — ABNORMAL HIGH
Dog Dander: <0.1 kU/L
Elm IgE: <0.1 kU/L
Fescue: <0.1 kU/L
G005 Rye, Perennial: <0.1 kU/L
G009 Red Top: <0.1 kU/L
Goldenrod: <0.1 kU/L
Helminthosporium halodes: <0.1 kU/L
House Dust Hollister: <0.1 kU/L
IgE (Immunoglobulin E), Serum: 424 kU/L — ABNORMAL HIGH (ref <115)
Lamb's Quarters: <0.1 kU/L
Oak: <0.1 kU/L
Plantain: <0.1 kU/L
Stemphylium Botryosum: <0.1 kU/L
Sycamore Tree: <0.1 kU/L
Timothy Grass: <0.1 kU/L

## 2014-06-02 ENCOUNTER — Telehealth: Payer: Self-pay | Admitting: Internal Medicine

## 2014-06-02 ENCOUNTER — Encounter: Payer: Self-pay | Admitting: Internal Medicine

## 2014-06-02 NOTE — Telephone Encounter (Signed)
I spoke with the pt and notified of her lab results  She verbalized understanding  Nothing further needed

## 2014-06-02 NOTE — Progress Notes (Signed)
Quick Note:  LMTCB ______ 

## 2014-06-02 NOTE — Progress Notes (Signed)
Quick Note:  Spoke with pt and notified of results per Dr. Wert. Pt verbalized understanding and denied any questions.  ______ 

## 2014-06-03 ENCOUNTER — Ambulatory Visit (INDEPENDENT_AMBULATORY_CARE_PROVIDER_SITE_OTHER)
Admission: RE | Admit: 2014-06-03 | Discharge: 2014-06-03 | Disposition: A | Discharge status: Home / Self Care | Payer: Commercial Managed Care - PPO | Source: Ambulatory Visit | Attending: Internal Medicine | Admitting: Internal Medicine

## 2014-06-03 ENCOUNTER — Ambulatory Visit (HOSPITAL_COMMUNITY)
Admission: RE | Admit: 2014-06-03 | Discharge: 2014-06-03 | Disposition: A | Discharge status: Home / Self Care | Payer: Commercial Managed Care - PPO | Source: Ambulatory Visit | Attending: Internal Medicine | Admitting: Internal Medicine

## 2014-06-03 DIAGNOSIS — Q791 Other congenital malformations of diaphragm: Secondary | ICD-10-CM | POA: Insufficient documentation

## 2014-06-03 DIAGNOSIS — R058 Other specified cough: Secondary | ICD-10-CM

## 2014-06-03 DIAGNOSIS — R05 Cough: Secondary | ICD-10-CM

## 2014-06-03 DIAGNOSIS — R938 Abnormal findings on diagnostic imaging of other specified body structures: Secondary | ICD-10-CM

## 2014-06-03 DIAGNOSIS — R9389 Abnormal findings on diagnostic imaging of other specified body structures: Secondary | ICD-10-CM

## 2014-06-03 MED ORDER — IOHEXOL 300 MG/ML  SOLN
80.0000 mL | Freq: Once | INTRAMUSCULAR | Status: AC | PRN
  Administered 2014-06-03: 80 mL via INTRAVENOUS

## 2014-06-03 NOTE — Progress Notes (Signed)
Quick Note:  Spoke with pt and notified of results per Dr. Wert. Pt verbalized understanding and denied any questions.  ______ 

## 2014-06-04 NOTE — Progress Notes (Signed)
Quick Note:  Spoke with pt and notified of results per Dr. Wert. Pt verbalized understanding and denied any questions.  ______ 

## 2014-06-11 ENCOUNTER — Ambulatory Visit (INDEPENDENT_AMBULATORY_CARE_PROVIDER_SITE_OTHER): Payer: Commercial Managed Care - PPO | Admitting: Internal Medicine

## 2014-06-11 ENCOUNTER — Encounter: Payer: Self-pay | Admitting: Internal Medicine

## 2014-06-11 VITALS — BP 126/70 | HR 87 | Ht 68.0 in | Wt 206.0 lb

## 2014-06-11 DIAGNOSIS — J45991 Cough variant asthma: Secondary | ICD-10-CM

## 2014-06-11 DIAGNOSIS — R9389 Abnormal findings on diagnostic imaging of other specified body structures: Secondary | ICD-10-CM

## 2014-06-11 DIAGNOSIS — R938 Abnormal findings on diagnostic imaging of other specified body structures: Secondary | ICD-10-CM

## 2014-06-11 NOTE — Progress Notes (Signed)
Subjective:    Patient ID: Shelly [name], female    DOB: 1950/06/30       MRN: [id number]    Brief patient profile:  63 yowf quit smoking 2008 watery eyes in her 20s year round ? Worse in winter eventually started on zyrtec 2013 and seemed to help s assoc cough/ wheeze then new onset cough Aug 2015 and proved refractory so referred to pulmonary clinic 05/15/14  by Dr Jeremy Johann   05/15/2014 1st [facility] office visit/ Shelly [name]   Chief Complaint  Patient presents with  . Pulmonary Consult    Referred by Dr. Carol Ada. Pt c/o cough since August 2015- cough is non prod and somtimes seems worse at night. She also c/o DOE with walking up stairs.    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   Prednisone 10 mg take  4 each am x 2 days,   2 each am x 2 days,  1 each am x 2 days and stop (this is to eliminate allergies and inflammation from coughing) Omeprazole 40 mg  Take 30-60 min before first meal of the day and Pepcid 20 mg one bedtime plus chlorpheniramine 4 mg x 2 at bedtime  GERD diet reviewed     05/28/2014 f/u ov/Shelly [name] re: cough since aug 2015 Chief Complaint  Patient presents with  . Acute Visit    Pt states that after last visit her cough had pretty much resolved, but then started back about 1 wk ago.  Cough is non prod and "comes and goes".   95% better, still urge to clear throat daytime on above rx/ confused about when to  d/c what  No noct cough  at all  Sob doe x steps better Then gradually worse x one week , again dry and variable   Kouffman Reflux v Neurogenic Cough Differentiator Reflux Comments  Do you awaken from a sound sleep coughing violently?                            With trouble breathing? Not now   Do you have choking episodes when you cannot  Get enough air, gasping for air ?              Not now   Do you usually cough when you lie down into  The bed, or when you just lie down to rest ?                          Not now    Do you usually cough after meals or eating?         No   Do you cough when (or after) you bend over?    Still some   GERD SCORE     Kouffman Reflux v Neurogenic Cough  Differentiator Neurogenic   Do you more-or-less cough all day long? no   Does change of temperature make you cough? no   Does laughing or chuckling cause you to cough? Still some   Do fumes (perfume, automobile fumes, burned  Toast, etc.,) cause you to cough ?      no   Does speaking, singing, or talking on the phone cause you to cough   ?               Not now   Neurogenic/Airway score       rec Prednisone 10 mg take  4 each am x 2 days,   2 each am x 2 days,  1 each am x 2 days and stop  Take Tessalon 4x daily  and supplement if needed with  tramadol 50 mg up to 2 every 4 hours to suppress the urge to cough. Swallowing water or using ice chips/non mint and menthol containing candies (such as lifesavers or sugarless jolly ranchers) are also effective.  You should rest your voice and avoid activities that you know make you cough. Once you have eliminated the cough for 3 straight days try reducing the tramadol first,  then the tessalon as tolerated Please see patient coordinator before you leave today  to schedule sinus and chest CT and fluoro        06/11/2014 f/u ov/Shelly [name] re: cough sine aug 2015 / abn cxr Chief Complaint  Patient presents with  . Follow-up    Pt states that her cough has resolved. No  new co's today.      No longer needing any tramadol at all   No  assoc sob  cp or chest tightness, subjective wheeze overt  hb symptoms. No unusual exp hx or h/o childhood pna/ asthma or knowledge of premature birth.  Sleeping ok without nocturnal  or early am exacerbation  of respiratory  c/o's or need for noct saba. Also denies any obvious fluctuation of symptoms with weather or environmental changes or other aggravating or alleviating factors except as outlined above   Current Medications, Allergies, Complete Past Medical History, Past Surgical History, Family History, and Social History were reviewed in [employer] record.  ROS  The following are not active complaints unless bolded sore throat, dysphagia, dental problems, itching, sneezing,  nasal congestion or excess watery secretions/ purulent secretions, ear ache,   fever, chills, sweats, unintended wt loss, pleuritic or exertional cp, hemoptysis,  orthopnea pnd or leg swelling, presyncope, palpitations, heartburn, abdominal pain, anorexia, nausea, vomiting, diarrhea  or change in bowel or urinary habits, change in stools or urine, dysuria,hematuria,  rash, arthralgias, visual complaints, headache, numbness weakness or ataxia or problems with walking or coordination,  change in mood/affect or memory.                Objective:   Physical Exam  05/28/2014      208 >  06/11/14  206 Wt Readings from Last 3 Encounters:  05/15/14 207 lb (93.895 kg)  04/29/13 193 lb (87.544 kg)  09/08/10 195 lb (88.451 kg)    Vital signs reviewed  HEENT: nl dentition, turbinates, and orophanx. Nl external ear canals without cough reflex   NECK :  without JVD/Nodes/TM/ nl carotid upstrokes bilaterally   LUNGS: no acc muscle use, clear bilaterally except for minimal decrease bs at L base    CV:  RRR  no s3 or murmur or increase in P2, no edema   ABD:  soft and nontender with nl excursion in the supine position. No bruits or  organomegaly, bowel sounds nl  MS:  warm without deformities, calf tenderness, cyanosis or clubbing - R  BKA   SKIN: warm and dry without lesions    NEURO:  alert, approp, no deficits              Assessment & Plan:

## 2014-06-11 NOTE — Patient Instructions (Signed)
Omeprazole 20 x 2 Take 30-60 min before first meal of the day and pepcid 20 mg one at bedtime  For drainage take chlortrimeton (chlorpheniramine) 4 mg every 4 hours available over the counter (may cause drowsiness)   If need anything stronger than delsym for cough please return

## 2014-06-22 ENCOUNTER — Encounter: Payer: Self-pay | Admitting: Internal Medicine

## 2014-06-23 MED ORDER — OMEPRAZOLE MAGNESIUM 20 MG PO TBEC: 40.0000 mg | DELAYED_RELEASE_TABLET | Freq: Every day | ORAL | Status: DC

## 2014-06-25 ENCOUNTER — Telehealth: Payer: Self-pay | Admitting: Internal Medicine

## 2014-06-25 NOTE — Telephone Encounter (Signed)
For now can use prilosec 20 mg otc x 2 in place of the 40 and try Pantoprazole (protonix) 40 mg   Take 30-60 min before first meal of the day  And see if this needs pa also

## 2014-06-25 NOTE — Telephone Encounter (Signed)
Telephone message created on patient.

## 2014-06-25 NOTE — Telephone Encounter (Signed)
Attn: Shelly [name]. I called [employer], the prescription company that does the 90 day supply of meds. They said that since the normal dosage of omeprazole is 20 mg, you will have to call for a prior authorization for the 40 mg daily. The number is [phone]    after you get the authorization you need to fax the prescription over again. The fax number is: [phone].        Also, will you tell Dr. Melvyn Novas that my cough has come back in full force. I had almost 2 weeks of no problems then it started up again. Thanks so much for your help.    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 The cough is worse at night. Pt aware I will forward to MW to advise on cough and then have nurse to start PA if MW feels patient should stay on same dose of Omeprazole.   Pt is aware that we will get her a call back tomorrow.   MW please advise on both questions.

## 2014-06-26 ENCOUNTER — Other Ambulatory Visit: Payer: Self-pay | Admitting: Internal Medicine

## 2014-06-26 NOTE — Telephone Encounter (Signed)
Use otc prilosec if needed at 40 mg daily ( 2 x20) Take 30-60 min before first meal of the day or see if her insurance will do Pantoprazole (protonix) 40 mg   Take 30-60 min before first meal of the day    Needs ov asap with all meds in hand .

## 2014-06-26 NOTE — Telephone Encounter (Signed)
mychart message sent back to pt

## 2014-06-26 NOTE — Telephone Encounter (Signed)
Dr Melvyn Novas, please advise on this also.  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 The cough is worse at night. Pt aware I will forward to MW to advise on cough and then have nurse to start PA if MW feels patient should stay on same dose of Omeprazole.

## 2014-06-28 ENCOUNTER — Encounter: Payer: Self-pay | Admitting: Internal Medicine

## 2014-06-28 NOTE — Assessment & Plan Note (Addendum)
-   allergy profile 05/28/2014 > No eos, IgE 424 POS RAST only dust and mold   - Sinus CT 06/03/2014 > wnl   ddx is between cough variant asthma and  Classic Upper airway cough syndrome, so named because it's frequently impossible to sort out how much is  CR/sinusitis with freq throat clearing (which can be related to primary GERD)   vs  causing  secondary (" extra esophageal")  GERD from wide swings in gastric pressure that occur with throat clearing, often  promoting self use of mint and menthol lozenges that reduce the lower esophageal sphincter tone and exacerbate the problem further in a cyclical fashion.   These are the same pts (now being labeled as having "irritable larynx syndrome" by some cough centers) who not infrequently have a history of having failed to tolerate ace inhibitors,  dry powder inhalers or biphosphonates or report having atypical reflux symptoms that don't respond to standard doses of PPI , and are easily confused as having aecopd or asthma flares by even experienced allergists/ pulmonologists.   For now rec max rx for uacs and see if /when cough flares again  See instructions for specific recommendations which were reviewed directly with the patient who was given a copy with highlighter outlining the key components.

## 2014-06-28 NOTE — Assessment & Plan Note (Signed)
See CXR 05/15/14 with elevated L HD vs nl 11/08/10  - CT chest 06/03/2014 > neg  - Fluoro 06/03/14 > poor excursion bilaterally, neg paradox  R works better than L   No further w/u > main rx is wt loss

## 2014-06-30 ENCOUNTER — Encounter: Payer: Self-pay | Admitting: Internal Medicine

## 2014-06-30 ENCOUNTER — Ambulatory Visit (INDEPENDENT_AMBULATORY_CARE_PROVIDER_SITE_OTHER): Payer: Commercial Managed Care - PPO | Admitting: Internal Medicine

## 2014-06-30 VITALS — BP 142/84 | HR 109 | Temp 98.2°F | Ht 68.0 in | Wt 207.2 lb

## 2014-06-30 DIAGNOSIS — J45991 Cough variant asthma: Secondary | ICD-10-CM

## 2014-06-30 MED ORDER — MONTELUKAST SODIUM 10 MG PO TABS: 10.0000 mg | ORAL_TABLET | Freq: Every day | ORAL | Status: DC

## 2014-06-30 MED ORDER — PREDNISONE 10 MG PO TABS: ORAL_TABLET | ORAL | Status: DC

## 2014-06-30 MED ORDER — TRAMADOL HCL 50 MG PO TABS: ORAL_TABLET | ORAL | Status: DC

## 2014-06-30 MED ORDER — OMEPRAZOLE 40 MG PO CPDR: 40.0000 mg | DELAYED_RELEASE_CAPSULE | Freq: Every day | ORAL | Status: DC

## 2014-06-30 NOTE — Progress Notes (Signed)
Subjective:    Patient ID: Colin Norment, female    DOB: 1951/03/18       MRN: [id number]    Brief patient profile:  63 yowf quit smoking 2008 watery eyes in her 78s year round ? Worse in winter eventually started on zyrtec 2013 and seemed to help s assoc cough/ wheeze then new onset cough Aug 2015 and proved refractory so referred to pulmonary clinic 05/15/14  by Dr Jeremy Johann   05/15/2014 1st [facility] office visit/ Hazely Sealey   Chief Complaint  Patient presents with  . Pulmonary Consult    Referred by Dr. Carol Ada. Pt c/o cough since August 2015- cough is non prod and somtimes seems worse at night. She also c/o DOE with walking up stairs.    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   Prednisone 10 mg take  4 each am x 2 days,   2 each am x 2 days,  1 each am x 2 days and stop (this is to eliminate allergies and inflammation from coughing) Omeprazole 40 mg  Take 30-60 min before first meal of the day and Pepcid 20 mg one bedtime plus chlorpheniramine 4 mg x 2 at bedtime  GERD diet reviewed     05/28/2014 f/u ov/Ekaterini Capitano re: cough since aug 2015 Chief Complaint  Patient presents with  . Acute Visit    Pt states that after last visit her cough had pretty much resolved, but then started back about 1 wk ago.  Cough is non prod and "comes and goes".   95% better, still urge to clear throat daytime on above rx/ confused about when to  d/c what  No noct cough  at all  Sob doe x steps better Then gradually worse x one week , again dry and variable   Kouffman Reflux v Neurogenic Cough Differentiator Reflux Comments  Do you awaken from a sound sleep coughing violently?                            With trouble breathing? Not now   Do you have choking episodes when you cannot  Get enough air, gasping for air ?              Not now   Do you usually cough when you lie down into  The bed, or when you just lie down to rest ?                          Not now    Do you usually cough after meals or eating?         No   Do you cough when (or after) you bend over?    Still some   GERD SCORE     Kouffman Reflux v Neurogenic Cough  Differentiator Neurogenic   Do you more-or-less cough all day long? no   Does change of temperature make you cough? no   Does laughing or chuckling cause you to cough? Still some   Do fumes (perfume, automobile fumes, burned  Toast, etc.,) cause you to cough ?      no   Does speaking, singing, or talking on the phone cause you to cough   ?               Not now   Neurogenic/Airway score       rec Prednisone 10 mg take  4 each am x 2 days,   2 each am x 2 days,  1 each am x 2 days and stop  Take Tessalon 4x daily  and supplement if needed with  tramadol 50 mg up to 2 every 4 hours to suppress the urge to cough. Swallowing water or using ice chips/non mint and menthol containing candies (such as lifesavers or sugarless jolly ranchers) are also effective.  You should rest your voice and avoid activities that you know make you cough. Once you have eliminated the cough for 3 straight days try reducing the tramadol first,  then the tessalon as tolerated Please see patient coordinator before you leave today  to schedule sinus and chest CT and fluoro > neg        06/11/2014 f/u ov/Teanna Elem re: cough sine aug 2015 / abn cxr Chief Complaint  Patient presents with  . Follow-up    Pt states that her cough has  resolved. No new co's today.   No longer needing any tramadol at all  Omeprazole 20 x 2 Take 30-60 min before first meal of the day and pepcid 20 mg one at bedtime For drainage take chlortrimeton (chlorpheniramine) 4 mg every 4 hours available over the counter (may cause drowsiness)  If  need anything stronger than delsym for cough please return     06/30/2014 f/u ov/Anastasio Wogan re: recurrent cough since Aug 2015 while on maint rx with ppi/hs h1  Chief Complaint  Patient presents with  . Follow-up    Pt c/o cough x 1 wk- "not as bad as it was before".  Cough is prod occ with minimal clear sputum.   some cough waking her w/in 4 h of hs  But Mostly coughing daytime, no worse in cold air, with urge to clear the throat which had completely cleared p pred rx, some better with chlorpheniramine vs zyrtec but nose really never quit running. Delsym not working any more    No obvious day to day or daytime variabilty or assoc sob  or cp or chest tightness, subjective wheeze overt sinus or hb symptoms. No unusual exp hx or h/o childhood pna/ asthma or knowledge of premature birth.  Sleeping ok without nocturnal  or early am exacerbation  of respiratory  c/o's or need for noct saba. Also denies any obvious fluctuation of symptoms with weather or environmental changes or other aggravating or alleviating factors except as outlined above   Current Medications, Allergies, Complete Past Medical History, Past Surgical History, Family History, and Social History were reviewed in [employer] record.       ROS  The following are not active complaints unless bolded sore throat, dysphagia, dental problems, itching, sneezing,  nasal congestion or excess watery secretions/ purulent secretions, ear ache,   fever, chills, sweats, unintended wt loss, pleuritic or exertional cp, hemoptysis,  orthopnea pnd or leg swelling, presyncope,  palpitations, heartburn, abdominal pain, anorexia, nausea, vomiting,  diarrhea  or change in bowel or urinary habits, change in stools or urine, dysuria,hematuria,  rash, arthralgias, visual complaints, headache, numbness weakness or ataxia or problems with walking or coordination,  change in mood/affect or memory.                Objective:   Physical Exam  05/28/2014      208 >  06/11/14  206 >   06/30/2014 207  Wt Readings from Last 3 Encounters:  05/15/14 207 lb (93.895 kg)  04/29/13 193 lb (87.544 kg)  09/08/10 195 lb (88.451 kg)    Vital signs reviewed  HEENT: nl dentition, turbinates, and orophanx. Nl external ear canals without cough reflex   NECK :  without JVD/Nodes/TM/ nl carotid upstrokes bilaterally   LUNGS: no acc muscle use, clear bilaterally except for minimal decrease bs at L base    CV:  RRR  no s3 or murmur or increase in P2, no edema   ABD:  soft and nontender with nl excursion in the supine position. No bruits or organomegaly, bowel sounds nl  MS:  warm without deformities, calf tenderness, cyanosis or clubbing - R  BKA   SKIN: warm and dry without lesions    NEURO:  alert, approp, no deficits              Assessment & Plan:

## 2014-06-30 NOTE — Assessment & Plan Note (Signed)
-   allergy profile 05/28/2014 > No eos, IgE 424 POS RAST only dust and mold  -  Sinus CT 06/03/2014 > wnl   Moderate flare while still on max ppi/ h1/h2 that responded to prednisone suggests cough variant asthma vs eos rhinitis vs eos bronchitis vs UACS from non-secific rhinitis   I had an extended discussion with the patient reviewing all relevant studies completed to date and  lasting 15 to 20 minutes of a 25 minute visit on the following ongoing concerns:  Discussed in detail all the  indications, usual  risks and alternatives  relative to the benefits with patient who agrees to proceed with  Challenge with singulair plus continue all other meds the same and do cyclical cough protocol again with only 6 days of prednisone

## 2014-06-30 NOTE — Patient Instructions (Signed)
Prednisone 10 mg take  4 each am x 2 days,   2 each am x 2 days,  1 each am x 2 days and stop   Take Tessalon 4x daily  and supplement if needed with  tramadol 50 mg up to 2 every 4 hours to suppress the urge to cough. Swallowing water or using ice chips/non mint and menthol containing candies (such as lifesavers or sugarless jolly ranchers) are also effective.  You should rest your voice and avoid activities that you know make you cough.  Once you have eliminated the cough for 3 straight days try reducing the tramadol first,  then the tessalon as tolerated  Singulair(montelukast) 10 mg one daily in pm  Next step would a methacholine challenge test - call Libby at [phone] if not better  Please schedule a follow up office visit in 6 weeks, call sooner if needed

## 2014-07-03 ENCOUNTER — Ambulatory Visit (INDEPENDENT_AMBULATORY_CARE_PROVIDER_SITE_OTHER): Payer: Commercial Managed Care - PPO | Admitting: Interventional Cardiology

## 2014-07-03 ENCOUNTER — Encounter: Payer: Self-pay | Admitting: Interventional Cardiology

## 2014-07-03 VITALS — BP 128/84 | HR 90 | Ht 68.0 in | Wt 205.1 lb

## 2014-07-03 DIAGNOSIS — I2510 Atherosclerotic heart disease of native coronary artery without angina pectoris: Secondary | ICD-10-CM

## 2014-07-03 DIAGNOSIS — E785 Hyperlipidemia, unspecified: Secondary | ICD-10-CM

## 2014-07-03 NOTE — Patient Instructions (Signed)
Your physician has requested that you have an exercise tolerance test. For further information please visit [email or web address]. Please also follow instruction sheet, as given.  Your physician recommends that you schedule a follow-up appointment as needed with Dr. Irish Lack.

## 2014-07-03 NOTE — Progress Notes (Signed)
Patient ID: Shelly [name], female   DOB: Apr 18, 1951, 64 y.o.   MRN: [id number]     Patient ID: Shelly [name] MRN: [id number] DOB/AGE: 03-22-1951 64 y.o.   Referring Physician Dr. Melvyn Novas; Dr. Carol Ada   Reason for Consultation coronary atherosclerosis  HPI: 64 y/o who had a cough.  THis led to a CT scan.  THis revealed aortic and LAD atherosclerosis.  No chest discomfort.  Walking fast is her most strenuous activity.  She has some DOE.  She does have GERD.  Father had MI.  No one with early CAD.  She had a right BKA for right ankle Avascular necrosis.   She has never had any prior cardiac testing other than an ECG.   Current Outpatient Prescriptions  Medication Sig Dispense Refill  . B Complex-C (SUPER B COMPLEX) TABS Take 1 tablet by mouth daily.    . benzonatate (TESSALON) 200 MG capsule One four times daily as needed for cough 40 capsule 2  . buPROPion (WELLBUTRIN XL) 300 MG 24 hr tablet Take 300 mg by mouth daily.      . Calcium Carbonate-Vitamin D (RA CALCIUM PLUS VITAMIN D) 600-400 MG-UNIT per tablet Take 1 tablet by mouth 2 (two) times daily.      . CASCARA SAGRADA PO Take 2 capsules by mouth daily.      . chlorpheniramine (CHLOR-TRIMETON) 4 MG tablet Take 8 mg by mouth at bedtime.    . Estradiol-Norethindrone Acet (MIMVEY LO) 0.5-0.1 MG per tablet Take 1 tablet by mouth daily.    . famotidine (PEPCID) 20 MG tablet One at bedtime 30 tablet 2  . FLUoxetine (PROZAC) 20 MG capsule Take 20 mg by mouth daily.    . folic acid (FOLVITE) 885 MCG tablet Take 800 mcg by mouth daily.      Marland Kitchen L-Lysine 1000 MG TABS Take 1 tablet by mouth daily.      Marland Kitchen lisdexamfetamine (VYVANSE) 50 MG capsule Take 50 mg by mouth daily.    . magnesium gluconate (MAGONATE) 500 MG tablet Take 500 mg by mouth daily.    . montelukast (SINGULAIR) 10 MG tablet Take 1 tablet (10 mg total) by mouth at bedtime. 30 tablet 11  . Multiple Vitamins-Minerals (CENTRUM SILVER PO) Take 1 tablet by mouth daily.        Marland Kitchen omeprazole (PRILOSEC) 40 MG capsule Take 1 capsule (40 mg total) by mouth daily. 30 capsule 2  . predniSONE (DELTASONE) 10 MG tablet Take  4 each am x 2 days,   2 each am x 2 days,  1 each am x 2 days and stop 14 tablet 0  . simvastatin (ZOCOR) 20 MG tablet Take 20 mg by mouth at bedtime.      . traMADol (ULTRAM) 50 MG tablet 1-2 every 4 hours as needed for cough or pain 40 tablet 0  . vitamin B-12 (CYANOCOBALAMIN) 1000 MCG tablet Take 2,500 mcg by mouth daily.      No current facility-administered medications for this visit.   Past Medical History  Diagnosis Date  . Hyperlipidemia   . Leg edema     left  . Edema of foot     bilateral    Family History  Problem Relation Age of Onset  . Dementia Mother 41    alive  . Fibromyalgia Mother   . Heart attack Father 76    deceased  . Other Brother     alive  . Heart murmur Sister 52  she had open heart surgery  . Other Sister 41    She is bIpolar- diabetic  . Allergies Mother   . Multiple myeloma Father   . Breast cancer Sister     History   Social History  . Marital Status: Divorced    Spouse Name: N/A    Number of Children: 3  . Years of Education: N/A   Occupational History  . ACCOUNTING    Social History Main Topics  . Smoking status: Former Smoker -- 1.00 packs/day for 40 years    Types: Cigarettes    Quit date: 02/05/2007  . Smokeless tobacco: Never Used  . Alcohol Use: No  . Drug Use: No  . Sexual Activity: Not on file   Other Topics Concern  . Not on file   Social History Narrative    Past Surgical History  Procedure Laterality Date  . Tonsillectomy    . Tubal ligation  1983  . Ankle surgery  2010    right cordicompression  . Ankle fusion  2011    right      (Not in a hospital admission)  Review of systems complete and found to be negative unless listed above .  No nausea, vomiting.  No fever chills, No focal weakness,  No palpitations.  Physical Exam: Filed Vitals:   07/03/14 1508   BP: 128/84  Pulse: 90    Weight: 205 lb 1.9 oz (93.042 kg)  Physical exam:  [place]/AT EOMI No JVD, No carotid bruit RRR S1S2  No wheezing Soft. NT, nondistended No edema. No focal motor or sensory deficits Normal affect  Labs:   Lab Results  Component Value Date   WBC 6.9 05/28/2014   HGB 13.7 05/28/2014   HCT 41.5 05/28/2014   MCV 93.3 05/28/2014   PLT 238.0 05/28/2014   No results for input(s): NA, K, CL, CO2, BUN, CREATININE, CALCIUM, PROT, BILITOT, ALKPHOS, ALT, AST, GLUCOSE in the last 168 hours.  Invalid input(s): LABALBU No results found for: CKTOTAL, CKMB, CKMBINDEX, TROPONINI No results found for: CHOL No results found for: HDL No results found for: LDLCALC No results found for: TRIG No results found for: CHOLHDL No results found for: LDLDIRECT    Radiology: [id number]  ASSESSMENT AND PLAN:  1) She has some degree of atherosclerosis based on the CT scan. Based on her symptoms, it seems unlikely that this is hemodynamically significant. Will plan for exercise treadmill test to evaluate her functional capacity and to see whether there is any significant disease. Otherwise, she should continue with aggressive risk factor modification. Continue simvastatin for lipid lowering therapy.  If the stress test is normal, I will see her back on a when necessary basis.  Signed:   Mina Marble, MD, [facility] 07/03/2014, 3:36 PM

## 2014-07-10 ENCOUNTER — Ambulatory Visit (HOSPITAL_COMMUNITY)
Admission: RE | Admit: 2014-07-10 | Discharge: 2014-07-10 | Disposition: A | Discharge status: Home / Self Care | Payer: Commercial Managed Care - PPO | Source: Ambulatory Visit | Attending: Interventional Cardiology | Admitting: Interventional Cardiology

## 2014-07-10 ENCOUNTER — Other Ambulatory Visit: Payer: Self-pay

## 2014-07-10 DIAGNOSIS — R0789 Other chest pain: Secondary | ICD-10-CM | POA: Diagnosis present

## 2014-07-10 DIAGNOSIS — I2510 Atherosclerotic heart disease of native coronary artery without angina pectoris: Secondary | ICD-10-CM | POA: Insufficient documentation

## 2014-08-01 ENCOUNTER — Other Ambulatory Visit: Payer: Self-pay | Admitting: Internal Medicine

## 2014-08-11 ENCOUNTER — Ambulatory Visit: Payer: Commercial Managed Care - PPO | Admitting: Internal Medicine

## 2014-10-26 ENCOUNTER — Other Ambulatory Visit: Payer: Self-pay | Admitting: Internal Medicine

## 2015-01-13 ENCOUNTER — Telehealth: Payer: Self-pay | Admitting: Cardiology

## 2015-01-13 NOTE — Telephone Encounter (Signed)
Received records from [facility] for appointment on 01/30/15 with Cecilie Kicks, NP.  Records given to [employer] (medical records) for Laura's schedule on 01/30/15. lp

## 2015-01-14 ENCOUNTER — Encounter (HOSPITAL_COMMUNITY): Payer: Self-pay

## 2015-01-14 ENCOUNTER — Encounter (HOSPITAL_COMMUNITY)
Admission: RE | Admit: 2015-01-14 | Discharge: 2015-01-14 | Disposition: A | Discharge status: Home / Self Care | Payer: 59 | Source: Ambulatory Visit | Attending: Obstetrics and Gynecology | Admitting: Obstetrics and Gynecology

## 2015-01-14 DIAGNOSIS — Z01818 Encounter for other preprocedural examination: Secondary | ICD-10-CM | POA: Insufficient documentation

## 2015-01-14 HISTORY — DX: Depression, unspecified: F32.A

## 2015-01-14 HISTORY — DX: Anxiety disorder, unspecified: F41.9

## 2015-01-14 HISTORY — DX: Gastro-esophageal reflux disease without esophagitis: K21.9

## 2015-01-14 HISTORY — DX: Major depressive disorder, single episode, unspecified: F32.9

## 2015-01-14 HISTORY — DX: Nausea with vomiting, unspecified: R11.2

## 2015-01-14 HISTORY — DX: Malignant (primary) neoplasm, unspecified: C80.1

## 2015-01-14 HISTORY — DX: Other specified postprocedural states: Z98.890

## 2015-01-14 LAB — CBC
HCT: 38.9 % (ref 36.0–46.0)
Hemoglobin: 12.8 g/dL (ref 12.0–15.0)
MCH: 31 pg (ref 26.0–34.0)
MCHC: 32.9 g/dL (ref 30.0–36.0)
MCV: 94.2 fL (ref 78.0–100.0)
Platelets: 238 10*3/uL (ref 150–400)
RBC: 4.13 MIL/uL (ref 3.87–5.11)
RDW: 12.9 % (ref 11.5–15.5)
WBC: 5.9 10*3/uL (ref 4.0–10.5)

## 2015-01-14 NOTE — Patient Instructions (Signed)
Your procedure is scheduled on:01/28/15  Enter through the Main Entrance at :56  Pick up desk phone and dial [phone] and inform us of your arrival.  Please call [phone] if you have any problems the morning of surgery.  Remember: Do not eat food after midnight:Tuesday Clear liquids are ok until:9am on WED   You may brush your teeth the morning of surgery.  Take these meds the morning of surgery with a sip of water:usual prescribed meds  DO NOT wear jewelry, eye make-up, lipstick,body lotion, or dark fingernail polish.  (Polished toes are ok) You may wear deodorant.  If you are to be admitted after surgery, leave suitcase in car until your room has been assigned. Patients discharged on the day of surgery will not be allowed to drive home. Wear loose fitting, comfortable clothes for your ride home.

## 2015-01-21 NOTE — Anesthesia Preprocedure Evaluation (Addendum)
Anesthesia Evaluation  Patient identified by MRN, date of birth, ID band Patient awake    Reviewed: Allergy & Precautions, NPO status , Patient's Chart, lab work & pertinent test results  History of Anesthesia Complications (+) PONV  Airway Mallampati: II   Neck ROM: Full    Dental  (+) Edentulous Upper, Dental Advisory Given   Pulmonary asthma , former smoker (quit 2008 40 pack years),  breath sounds clear to auscultation        Cardiovascular Rhythm:Regular  ETT 07/2014 Normal, EKG 06/2014 OK   Neuro/Psych Anxiety Depression    GI/Hepatic Neg liver ROS, GERD-  Medicated,  Endo/Other  negative endocrine ROS  Renal/GU      Musculoskeletal   Abdominal (+)  Abdomen: soft.    Peds  Hematology 12/39   Anesthesia Other Findings Prednisone was for short term only  Reproductive/Obstetrics                           Anesthesia Physical Anesthesia Plan  ASA: II  Anesthesia Plan: General   Post-op Pain Management:    Induction: Intravenous  Airway Management Planned: LMA  Additional Equipment:   Intra-op Plan:   Post-operative Plan:   Informed Consent: I have reviewed the patients History and Physical, chart, labs and discussed the procedure including the risks, benefits and alternatives for the proposed anesthesia with the patient or authorized representative who has indicated his/her understanding and acceptance.     Plan Discussed with:   Anesthesia Plan Comments: (Check am labs)        Anesthesia Quick Evaluation

## 2015-01-27 NOTE — H&P (Signed)
History of Present Illness  General:  64 y/o presents for preop. Scheduled for wide local excision on 8/24 of vulvar lesion that was VIN 3/CIS on punch biopsy. Pt initially had a vulvar ulceration that did not respond to topical steroids. Pt denied abnormal bleeding, pain or abnormal discharge. Pt's brother had severe CAD and she was instructed to get an evaluation per his cardiologist. Pt had a negative stress test March of this year. Cardiology and anesthesia OK with preceding prior to formal cardiac work up.   Current Medications  Taking   Centrum Silver Tablet as directed   Folic Acid 662 MCG Tablet as directed   Cascara Sagrada 450 MG Capsule as directed   Vitamin B-12 1000 MCG Tablet 1 tablet Once a day   Super B Complex Tablet as directed   Magnesium 500 MG Tablet 1 tablet with a meal Once a day, Notes: sometimes   L-Lysine 1000 MG Tablet as directed   B-1 100 MG Tablet 1 tablet Once a day   Omeprazole 20 MG Capsule Delayed Release 1 capsule Once a day   Mimvey(Estradiol-Norethindrone Acet) 1-0.5 MG Tablet 1 tablet Once a day   Vyvanse(Lisdexamfetamine Dimesylate) 60 MG Capsule 1 capsule in the morning Once a day   Simvastatin 20 MG Tablet 1 tablet in the evening Once a day   BuPROPion HCl 300 MG Tablet Extended Release 24 Hour 1 tablet every morning Once a day   Fluoxetine HCl 20 MG Tablet 1 tablet Once a day   Valtrex(ValACYclovir HCl) 1 GM Tablet take 2 tablets by mouth every 12 hours for 24 hours as needed for cold sores every 12 hours x 24 hours   Not-Taking/PRN   Skelaxin(Metaxalone) 800 MG Tablet 1 tablet tid prn for neck pain   Tramadol HCl 50 MG Tablet 1 tablet as needed every 6 hrs   Ibuprofen 600 MG Tablet 1 tablet Three times a day   Zovirax(Acyclovir) 5 % Cream 1 application to affected area Five times a day   Benzonatate 100 MG Capsule 1-2 caps every 8 hours as needed   Hycodan(Hydrocodone-Homatropine) 5-1.5 MG/5ML Syrup 5 ml as needed every 6 hrs, p.r.n. for cough    Diclofenac Sodium 75 MG Tablet 1 tab bid ac prn   Calcium + D(Calcium-Vitamin D) 600-200 MG-UNIT Tablet 1 tablet with food Once a day   Medication List reviewed and reconciled with the patient    Past Medical History  Depression  fibromyalgia-doubtful diagnoses, rheumatologist, Dr. Estanislado Pandy  right foot avascular necrosis and ankle fusion-multiple surgeries chronic pain-now S/P AMPUTATION  Hyperlipidemia  Allergies  Postmenopausal with hot flashes  L3 vertebral fracture with chronic low back pain  history of postmenopausal vaginal bleeding-benign ultrasound evaluation, stopped HRT therapy  DEXA 12/04/2014, T score of hip -1.9, unable to do spine, radius +0.9   Surgical History  ankle surgery (multiple including avascular necrosis core decompression, ankle fusion, hindfoot ankle fusion, surgical grafting)   skin graft   tonsillectomy   tubal ligation   right BKA 09/13   Family History  Father: deceased, heart issues, HTN, COPD, high cholesterol, diagnosed with HTN  Mother: alive, dementia  Sister 1: alive, bipolar, heart disease, Breast cancer, diagnosed with Breast Ca  Father: Hypertension, diabetes, heart attack at 8 multiple myeloma Mother hyperlipidemia Brother hypertension hyperlipidemia diabetes , Brother had triple by-pass surgery.   Social History  General:  Tobacco use  cigarettes: Former smoker Quit in year 2008 Pack-year Hx: 20 Tobacco history last updated 01/13/2015 no Exposed  to passive smoke.  Alcohol: yes, Rare.  no Recreational drug use.  Exercise: minimal.  Occupation: Optometrist.  Marital Status: single, Divorced.  Children: 3 children.  Religion: Catholic.    Gyn History  Periods : postmenopausal.  Denies H/O LMP.  Birth control BTL.  Last pap smear date 08/17/12, all negative.  Last mammogram date 02/06/14.  Abnormal pap smear none.  STD none.    OB History  Number of pregnancies 3.  Pregnancy # 1 live birth, vaginal delivery.  Pregnancy  # 2 live birth, vaginal delivery.  Pregnancy # 3 live birth, vaginal delivery.    Allergies  N.K.D.A.   Hospitalization/Major Diagnostic Procedure  see above    Review of Systems  Denies fever/chills, chest pain, SOB, headaches, numbness/tingling. No h/o complication with anesthesia, bleeding disorders or blood clots.   Vital Signs  Wt 200, Wt change -1.4 lb, Ht 68, BMI 30.41, Pulse sitting 100, BP sitting 134/79.   Physical Examination  GENERAL:  Patient appears alert and oriented.  General Appearance: well-appearing, well-developed, no acute distress.  Speech: clear.  LUNGS:  Auscultation: no wheezing/rhonchi/rales. CTA bilaterally.  HEART:  Heart sounds: normal. RRR. no murmur.  ABDOMEN:  General: soft nontender, nondistended, no masses.  FEMALE GENITOURINARY:  Pelvic Vulva: Previous biopsy site is healing well, no abnormal bleeding or discharge.  EXTREMITIES:  General: No edema or calf tenderness.     Assessments   1. Pre-operative clearance - Z01.818 (Primary)   2. VIN III (vulvar intraepithelial neoplasia III) - D07.1, Severe dysplasia   Treatment  1. VIN III (vulvar intraepithelial neoplasia III)  Notes: Most of lesion was excised during punch biopsy but WLE recommended to remove remaining cells. R/B/A reviewed with pt to include bleeding, infection, discomfort, abnormal cosmesis. All questions answered. Consent obtained.    Follow Up  2 Weeks post op   History of Present Illness  General:  64 y/o presents for preop. Scheduled for wide local excision on 8/24 of vulvar lesion that was VIN 3/CIS on punch biopsy. Pt initially had a vulvar ulceration that did not respond to topical steroids. Pt denied abnormal bleeding, pain or abnormal discharge. Pt's brother had severe CAD and she was instructed to get an evaluation per his cardiologist. Pt had a negative stress test March of this year. Cardiology and anesthesia OK with preceding prior to formal cardiac work up.    Current Medications  Taking   Centrum Silver Tablet as directed   Folic Acid 683 MCG Tablet as directed   Cascara Sagrada 450 MG Capsule as directed   Vitamin B-12 1000 MCG Tablet 1 tablet Once a day   Super B Complex Tablet as directed   Magnesium 500 MG Tablet 1 tablet with a meal Once a day, Notes: sometimes   L-Lysine 1000 MG Tablet as directed   B-1 100 MG Tablet 1 tablet Once a day   Omeprazole 20 MG Capsule Delayed Release 1 capsule Once a day   Mimvey(Estradiol-Norethindrone Acet) 1-0.5 MG Tablet 1 tablet Once a day   Vyvanse(Lisdexamfetamine Dimesylate) 60 MG Capsule 1 capsule in the morning Once a day   Simvastatin 20 MG Tablet 1 tablet in the evening Once a day   BuPROPion HCl 300 MG Tablet Extended Release 24 Hour 1 tablet every morning Once a day   Fluoxetine HCl 20 MG Tablet 1 tablet Once a day   Valtrex(ValACYclovir HCl) 1 GM Tablet take 2 tablets by mouth every 12 hours for 24 hours as needed for cold  sores every 12 hours x 24 hours   Not-Taking/PRN   Skelaxin(Metaxalone) 800 MG Tablet 1 tablet tid prn for neck pain   Tramadol HCl 50 MG Tablet 1 tablet as needed every 6 hrs   Ibuprofen 600 MG Tablet 1 tablet Three times a day   Zovirax(Acyclovir) 5 % Cream 1 application to affected area Five times a day   Benzonatate 100 MG Capsule 1-2 caps every 8 hours as needed   Hycodan(Hydrocodone-Homatropine) 5-1.5 MG/5ML Syrup 5 ml as needed every 6 hrs, p.r.n. for cough   Diclofenac Sodium 75 MG Tablet 1 tab bid ac prn   Calcium + D(Calcium-Vitamin D) 600-200 MG-UNIT Tablet 1 tablet with food Once a day   Medication List reviewed and reconciled with the patient    Past Medical History  Depression  fibromyalgia-doubtful diagnoses, rheumatologist, Dr. Estanislado Pandy  right foot avascular necrosis and ankle fusion-multiple surgeries chronic pain-now S/P AMPUTATION  Hyperlipidemia  Allergies  Postmenopausal with hot flashes  L3 vertebral fracture with chronic low back pain   history of postmenopausal vaginal bleeding-benign ultrasound evaluation, stopped HRT therapy  DEXA 12/04/2014, T score of hip -1.9, unable to do spine, radius +0.9   Surgical History  ankle surgery (multiple including avascular necrosis core decompression, ankle fusion, hindfoot ankle fusion, surgical grafting)   skin graft   tonsillectomy   tubal ligation   right BKA 09/13   Family History  Father: deceased, heart issues, HTN, COPD, high cholesterol, diagnosed with HTN  Mother: alive, dementia  Sister 1: alive, bipolar, heart disease, Breast cancer, diagnosed with Breast Ca  Father: Hypertension, diabetes, heart attack at 76 multiple myeloma Mother hyperlipidemia Brother hypertension hyperlipidemia diabetes , Brother had triple by-pass surgery.   Social History  General:  Tobacco use  cigarettes: Former smoker Quit in year 2008 Pack-year Hx: 20 Tobacco history last updated 01/13/2015 no Exposed to passive smoke.  Alcohol: yes, Rare.  no Recreational drug use.  Exercise: minimal.  Occupation: Optometrist.  Marital Status: single, Divorced.  Children: 3 children.  Religion: Catholic.    Gyn History  Periods : postmenopausal.  Denies H/O LMP.  Birth control BTL.  Last pap smear date 08/17/12, all negative.  Last mammogram date 02/06/14.  Abnormal pap smear none.  STD none.    OB History  Number of pregnancies 3.  Pregnancy # 1 live birth, vaginal delivery.  Pregnancy # 2 live birth, vaginal delivery.  Pregnancy # 3 live birth, vaginal delivery.    Allergies  N.K.D.A.   Hospitalization/Major Diagnostic Procedure  see above    Review of Systems  Denies fever/chills, chest pain, SOB, headaches, numbness/tingling. No h/o complication with anesthesia, bleeding disorders or blood clots.   Vital Signs  Wt 200, Wt change -1.4 lb, Ht 68, BMI 30.41, Pulse sitting 100, BP sitting 134/79.   Physical Examination  GENERAL:  Patient appears alert and oriented.  General  Appearance: well-appearing, well-developed, no acute distress.  Speech: clear.  LUNGS:  Auscultation: no wheezing/rhonchi/rales. CTA bilaterally.  HEART:  Heart sounds: normal. RRR. no murmur.  ABDOMEN:  General: soft nontender, nondistended, no masses.  FEMALE GENITOURINARY:  Pelvic Vulva: Previous biopsy site is healing well, no abnormal bleeding or discharge.  EXTREMITIES:  General: No edema or calf tenderness.     Assessments   1. Pre-operative clearance - Z01.818 (Primary)   2. VIN III (vulvar intraepithelial neoplasia III) - D07.1, Severe dysplasia   Treatment  1. VIN III (vulvar intraepithelial neoplasia III)  Notes: Most of lesion was  excised during punch biopsy but WLE recommended to remove remaining cells. R/B/A reviewed with pt to include bleeding, infection, discomfort, abnormal cosmesis. All questions answered. Consent obtained.    Follow Up  2 Weeks post op

## 2015-01-28 ENCOUNTER — Encounter (HOSPITAL_COMMUNITY)
Admission: RE | Disposition: A | Discharge status: Home / Self Care | Payer: Self-pay | Source: Ambulatory Visit | Attending: Obstetrics and Gynecology

## 2015-01-28 ENCOUNTER — Ambulatory Visit (HOSPITAL_COMMUNITY): Payer: 59 | Admitting: Anesthesiology

## 2015-01-28 ENCOUNTER — Ambulatory Visit (HOSPITAL_COMMUNITY)
Admission: RE | Admit: 2015-01-28 | Discharge: 2015-01-28 | Disposition: A | Discharge status: Home / Self Care | Payer: 59 | Source: Ambulatory Visit | Attending: Obstetrics and Gynecology | Admitting: Obstetrics and Gynecology

## 2015-01-28 DIAGNOSIS — D071 Carcinoma in situ of vulva: Secondary | ICD-10-CM | POA: Insufficient documentation

## 2015-01-28 DIAGNOSIS — Z825 Family history of asthma and other chronic lower respiratory diseases: Secondary | ICD-10-CM | POA: Diagnosis not present

## 2015-01-28 DIAGNOSIS — Z79899 Other long term (current) drug therapy: Secondary | ICD-10-CM | POA: Diagnosis not present

## 2015-01-28 DIAGNOSIS — Z833 Family history of diabetes mellitus: Secondary | ICD-10-CM | POA: Diagnosis not present

## 2015-01-28 DIAGNOSIS — Z87891 Personal history of nicotine dependence: Secondary | ICD-10-CM | POA: Insufficient documentation

## 2015-01-28 DIAGNOSIS — Z808 Family history of malignant neoplasm of other organs or systems: Secondary | ICD-10-CM | POA: Insufficient documentation

## 2015-01-28 DIAGNOSIS — G8929 Other chronic pain: Secondary | ICD-10-CM | POA: Diagnosis not present

## 2015-01-28 DIAGNOSIS — E785 Hyperlipidemia, unspecified: Secondary | ICD-10-CM | POA: Insufficient documentation

## 2015-01-28 DIAGNOSIS — Z8249 Family history of ischemic heart disease and other diseases of the circulatory system: Secondary | ICD-10-CM | POA: Insufficient documentation

## 2015-01-28 DIAGNOSIS — Z803 Family history of malignant neoplasm of breast: Secondary | ICD-10-CM | POA: Diagnosis not present

## 2015-01-28 DIAGNOSIS — Z89511 Acquired absence of right leg below knee: Secondary | ICD-10-CM | POA: Insufficient documentation

## 2015-01-28 DIAGNOSIS — N904 Leukoplakia of vulva: Secondary | ICD-10-CM | POA: Insufficient documentation

## 2015-01-28 DIAGNOSIS — F329 Major depressive disorder, single episode, unspecified: Secondary | ICD-10-CM | POA: Insufficient documentation

## 2015-01-28 HISTORY — PX: VULVECTOMY: SHX1086

## 2015-01-28 SURGERY — WIDE EXCISION VULVECTOMY
Anesthesia: General | Site: Vagina

## 2015-01-28 MED ORDER — PROPOFOL 10 MG/ML IV BOLUS
INTRAVENOUS | Status: AC
  Filled 2015-01-28: qty 20

## 2015-01-28 MED ORDER — SCOPOLAMINE 1 MG/3DAYS TD PT72
MEDICATED_PATCH | TRANSDERMAL | Status: AC
  Filled 2015-01-28: qty 1

## 2015-01-28 MED ORDER — FENTANYL CITRATE (PF) 100 MCG/2ML IJ SOLN
INTRAMUSCULAR | Status: AC
  Filled 2015-01-28: qty 4

## 2015-01-28 MED ORDER — EPHEDRINE SULFATE 50 MG/ML IJ SOLN
INTRAMUSCULAR | Status: DC | PRN
  Administered 2015-01-28: 5 mg via INTRAVENOUS

## 2015-01-28 MED ORDER — SCOPOLAMINE 1 MG/3DAYS TD PT72
1.0000 | MEDICATED_PATCH | Freq: Once | TRANSDERMAL | Status: DC
  Administered 2015-01-28: 1.5 mg via TRANSDERMAL

## 2015-01-28 MED ORDER — ONDANSETRON HCL 4 MG/2ML IJ SOLN
INTRAMUSCULAR | Status: AC
  Filled 2015-01-28: qty 2

## 2015-01-28 MED ORDER — LIDOCAINE HCL 1 % IJ SOLN
INTRAMUSCULAR | Status: AC
  Filled 2015-01-28: qty 20

## 2015-01-28 MED ORDER — PHENYLEPHRINE 40 MCG/ML (10ML) SYRINGE FOR IV PUSH (FOR BLOOD PRESSURE SUPPORT)
PREFILLED_SYRINGE | INTRAVENOUS | Status: AC
  Filled 2015-01-28: qty 10

## 2015-01-28 MED ORDER — DEXAMETHASONE SODIUM PHOSPHATE 4 MG/ML IJ SOLN
INTRAMUSCULAR | Status: AC
  Filled 2015-01-28: qty 1

## 2015-01-28 MED ORDER — LIDOCAINE-EPINEPHRINE 1 %-1:100000 IJ SOLN
INTRAMUSCULAR | Status: AC
  Filled 2015-01-28: qty 1

## 2015-01-28 MED ORDER — MEPERIDINE HCL 25 MG/ML IJ SOLN: 6.2500 mg | INTRAMUSCULAR | Status: DC | PRN

## 2015-01-28 MED ORDER — FENTANYL CITRATE (PF) 100 MCG/2ML IJ SOLN: 25.0000 ug | INTRAMUSCULAR | Status: DC | PRN

## 2015-01-28 MED ORDER — DEXAMETHASONE SODIUM PHOSPHATE 10 MG/ML IJ SOLN
INTRAMUSCULAR | Status: DC | PRN
  Administered 2015-01-28: 4 mg via INTRAVENOUS

## 2015-01-28 MED ORDER — MIDAZOLAM HCL 2 MG/2ML IJ SOLN
INTRAMUSCULAR | Status: DC | PRN
  Administered 2015-01-28: 2 mg via INTRAVENOUS

## 2015-01-28 MED ORDER — ONDANSETRON HCL 4 MG/2ML IJ SOLN
INTRAMUSCULAR | Status: DC | PRN
  Administered 2015-01-28: 4 mg via INTRAVENOUS

## 2015-01-28 MED ORDER — IBUPROFEN 600 MG PO TABS: 600.0000 mg | ORAL_TABLET | Freq: Four times a day (QID) | ORAL | Status: DC | PRN

## 2015-01-28 MED ORDER — LACTATED RINGERS IV SOLN
INTRAVENOUS | Status: DC
  Administered 2015-01-28 (×2): via INTRAVENOUS

## 2015-01-28 MED ORDER — LIDOCAINE-EPINEPHRINE 1 %-1:100000 IJ SOLN
INTRAMUSCULAR | Status: DC | PRN
  Administered 2015-01-28: 11 mL

## 2015-01-28 MED ORDER — PROMETHAZINE HCL 25 MG/ML IJ SOLN: 6.2500 mg | INTRAMUSCULAR | Status: DC | PRN

## 2015-01-28 MED ORDER — FENTANYL CITRATE (PF) 100 MCG/2ML IJ SOLN
INTRAMUSCULAR | Status: DC | PRN
  Administered 2015-01-28 (×2): 50 ug via INTRAVENOUS

## 2015-01-28 MED ORDER — KETOROLAC TROMETHAMINE 30 MG/ML IJ SOLN
INTRAMUSCULAR | Status: AC
  Filled 2015-01-28: qty 1

## 2015-01-28 MED ORDER — LIDOCAINE HCL (CARDIAC) 20 MG/ML IV SOLN
INTRAVENOUS | Status: DC | PRN
  Administered 2015-01-28: 100 mg via INTRAVENOUS

## 2015-01-28 MED ORDER — EPHEDRINE 5 MG/ML INJ
INTRAVENOUS | Status: AC
  Filled 2015-01-28: qty 10

## 2015-01-28 MED ORDER — LIDOCAINE HCL (CARDIAC) 20 MG/ML IV SOLN
INTRAVENOUS | Status: AC
  Filled 2015-01-28: qty 5

## 2015-01-28 MED ORDER — PHENYLEPHRINE HCL 10 MG/ML IJ SOLN
INTRAMUSCULAR | Status: DC | PRN
  Administered 2015-01-28: 40 ug via INTRAVENOUS
  Administered 2015-01-28: 80 ug via INTRAVENOUS
  Administered 2015-01-28 (×3): 40 ug via INTRAVENOUS

## 2015-01-28 MED ORDER — PROPOFOL 10 MG/ML IV BOLUS
INTRAVENOUS | Status: DC | PRN
  Administered 2015-01-28: 150 mg via INTRAVENOUS

## 2015-01-28 MED ORDER — MIDAZOLAM HCL 2 MG/2ML IJ SOLN
INTRAMUSCULAR | Status: AC
  Filled 2015-01-28: qty 4

## 2015-01-28 SURGICAL SUPPLY — 28 items
BLADE SURG 15 STRL LF C SS BP (BLADE) ×1 IMPLANT
BLADE SURG 15 STRL SS (BLADE) ×1
CLOTH BEACON ORANGE TIMEOUT ST (SAFETY) ×2 IMPLANT
CONTAINER PREFILL 10% NBF 15ML (MISCELLANEOUS) IMPLANT
COUNTER NEEDLE 1200 MAGNETIC (NEEDLE) ×2 IMPLANT
ELECT REM PT RETURN 9FT ADLT (ELECTROSURGICAL)
ELECTRODE REM PT RTRN 9FT ADLT (ELECTROSURGICAL) IMPLANT
GLOVE BIO SURGEON STRL SZ7 (GLOVE) ×2 IMPLANT
GLOVE BIOGEL PI IND STRL 7.0 (GLOVE) ×1 IMPLANT
GLOVE BIOGEL PI INDICATOR 7.0 (GLOVE) ×1
GOWN STRL REUS W/TWL LRG LVL3 (GOWN DISPOSABLE) ×4 IMPLANT
NEEDLE HYPO 22GX1.5 SAFETY (NEEDLE) ×2 IMPLANT
NS IRRIG 1000ML POUR BTL (IV SOLUTION) ×2 IMPLANT
PACK VAGINAL MINOR WOMEN LF (CUSTOM PROCEDURE TRAY) ×2 IMPLANT
PAD OB MATERNITY 4.3X12.25 (PERSONAL CARE ITEMS) ×2 IMPLANT
PAD PREP 24X48 CUFFED NSTRL (MISCELLANEOUS) ×2 IMPLANT
PENCIL BUTTON HOLSTER BLD 10FT (ELECTRODE) IMPLANT
SUT MNCRL AB 3-0 PS2 27 (SUTURE) ×4 IMPLANT
SUT MNCRL AB 4-0 PS2 18 (SUTURE) ×2 IMPLANT
SUT VIC AB 0 CT1 27 (SUTURE) ×1
SUT VIC AB 0 CT1 27XBRD ANBCTR (SUTURE) ×1 IMPLANT
SUT VIC AB 2-0 SH 27 (SUTURE) ×1
SUT VIC AB 2-0 SH 27XBRD (SUTURE) ×1 IMPLANT
SYR CONTROL 10ML LL (SYRINGE) ×2 IMPLANT
TOWEL OR 17X24 6PK STRL BLUE (TOWEL DISPOSABLE) ×4 IMPLANT
TUBING NON-CON 1/4 X 20 CONN (TUBING) IMPLANT
WATER STERILE IRR 1000ML POUR (IV SOLUTION) ×2 IMPLANT
YANKAUER SUCT BULB TIP NO VENT (SUCTIONS) IMPLANT

## 2015-01-28 NOTE — Brief Op Note (Signed)
01/28/2015  1:47 PM  PATIENT:  Malka So  64 y.o. female  PRE-OPERATIVE DIAGNOSIS:  N90.89  VIN 3/CIS  POST-OPERATIVE DIAGNOSIS:  Same  PROCEDURE:  Procedure(s): WIDE EXCISION VULVECTOMY (N/A) Wide Local excision  SURGEON:  Surgeon(s) and Role:    * Thurnell Lose, MD - Primary  PHYSICIAN ASSISTANT:   ASSISTANTS: Technician   ANESTHESIA:   general  EBL:  Total I/O In: 1000 [I.V.:1000] Out: 205 [Urine:200; Blood:5]  BLOOD ADMINISTERED:none  DRAINS: none   LOCAL MEDICATIONS USED:  LIDOCAINE with epinephrine    SPECIMEN:  Source of Specimen:  right labial lesion  DISPOSITION OF SPECIMEN:  PATHOLOGY  COUNTS:  YES  TOURNIQUET:  * No tourniquets in log *  DICTATION: .Other Dictation: Dictation Number [id number]  PLAN OF CARE: Discharge to home after PACU  PATIENT DISPOSITION:  PACU - hemodynamically stable.   Delay start of Pharmacological VTE agent (>24hrs) due to surgical blood loss or risk of bleeding: yes

## 2015-01-28 NOTE — Anesthesia Procedure Notes (Signed)
Procedure Name: LMA Insertion Date/Time: 01/28/2015 1:09 PM Performed by: Hewitt Blade Pre-anesthesia Checklist: Patient identified, Emergency Drugs available, Suction available and Patient being monitored Patient Re-evaluated:Patient Re-evaluated prior to inductionOxygen Delivery Method: Circle system utilized Preoxygenation: Pre-oxygenation with 100% oxygen Intubation Type: IV induction LMA: LMA inserted LMA Size: 3.0 Number of attempts: 1 Placement Confirmation: ETT inserted through vocal cords under direct vision,  positive ETCO2 and breath sounds checked- equal and bilateral Tube secured with: Tape Dental Injury: Teeth and Oropharynx as per pre-operative assessment

## 2015-01-28 NOTE — Anesthesia Postprocedure Evaluation (Signed)
  Anesthesia Post-op Note  Patient: Shelly [name]  Procedure(s) Performed: Procedure(s): WIDE EXCISION VULVECTOMY (N/A)  Patient Location: PACU  Anesthesia Type:General  Level of Consciousness: awake and alert   Airway and Oxygen Therapy: Patient Spontanous Breathing  Post-op Pain: mild  Post-op Assessment: Post-op Vital signs reviewed, Patient's Cardiovascular Status Stable, Respiratory Function Stable, Patent Airway and No signs of Nausea or vomiting              Post-op Vital Signs: Reviewed and stable  Last Vitals:  Filed Vitals:   01/28/15 1545  BP:   Pulse: 96  Temp: 36.8 C  Resp: 16    Complications: No apparent anesthesia complications

## 2015-01-28 NOTE — Interval H&P Note (Signed)
History and Physical Interval Note:  01/28/2015 12:52 PM  Shelly [name]  has presented today for surgery, with the diagnosis of N90.89  Vulvar Lesion  The various methods of treatment have been discussed with the patient and family. After consideration of risks, benefits and other options for treatment, the patient has consented to  Procedure(s): WIDE EXCISION VULVECTOMY (N/A) as a surgical intervention .  The patient's history has been reviewed, patient examined, no change in status, stable for surgery.  I have reviewed the patient's chart and labs.  Questions were answered to the patient's satisfaction.     Simona Huh, Lateisha Thurlow

## 2015-01-28 NOTE — Discharge Instructions (Addendum)
Excision of Skin Lesions Excision of a skin lesion refers to the removal of a section of skin by making small cuts (incisions) in the skin. This is typically done to remove a cancerous growth (basal cell carcinoma, squamous cell carcinoma, or melanoma) or a noncancerous growth (cyst). It may be done to treat or prevent cancer or infection. It may also be done to improve cosmetic appearance (removal of mole, skin tag). LET YOUR CAREGIVER KNOW ABOUT:   Allergies to food or medicine.  Medicines taken, including vitamins, herbs, eyedrops, over-the-counter medicines, and creams.  Use of steroids (by mouth or creams).  Previous problems with anesthetics or numbing medicines.  History of bleeding problems or blood clots.  History of any prostheses.  Previous surgery.  Other health problems, including diabetes and kidney problems.  Possibility of pregnancy, if this applies. RISKS AND COMPLICATIONS  Many complications can be managed. With appropriate treatment and rehabilitation, the following complications are very uncommon:  Bleeding.  Infection.  Scarring.  Recurrence of cyst or cancer.  Changes in skin sensation or appearance (discoloration, swelling).  Reaction to anesthesia.  Allergic reaction to surgical materials or ointments.  Damage to nerves, blood vessels, muscles, or other structures.  Continued pain. BEFORE THE PROCEDURE  It is important to follow your caregiver's instructions prior to your procedure to avoid complications. Steps before your procedure may include:  Physical exam, blood tests, other procedures, such as removing a small sample for examination under a microscope (biopsy).  Your caregiver may review the procedure, the anesthesia being used, and what to expect after the procedure with you. You may be asked to:  Stop taking certain medicines, such as blood thinners (including aspirin, clopidogrel, ibuprofen), for several days prior to your  procedure.  Take certain medicines.  Stop smoking. It is a good idea to arrange for a ride home after surgery and to have someone to help you with activities during recovery. PROCEDURE  There are several excision techniques. The type of excision or surgical technique used will depend on your condition, the location of the lesion, and your overall health. After the lesion is sterilized and a local anesthetic is applied, the following may be performed: Complete surgical excision The area to be removed is marked with a pen. Using a small scalpel and scissors, the surgeon gently cuts around and under the lesion until it is completely removed. The lesion is placed in a special fluid and sent to the lab for examination. If necessary, bleeding will be controlled with a device that delivers heat. The edges of the wound are stitched together and a dressing is applied. This procedure may be performed to treat a cancerous growth or noncancerous cyst or lesion. Surgeons commonly perform an elliptical excision, to minimize scarring. Excision of a cyst The surgeon makes an incision on the cyst. The entire cyst is removed through the incision. The wound may be closed with a suture (stitch). Shave excision During shave excision, the surgeon uses a small blade or loop instrument to shave off the lesion. This may be done to remove a mole or skin tag. The wound is usually left to heal on its own without stitches. Punch excision During punch excision, the surgeon uses a small, round tool (like a cookie cutter) to cut a circle shape out of the skin. The outer edges of the skin are stitched together. This may be done to remove a mole or scar or to perform a biopsy of the lesion. Mohs micrographic surgery During  Mohs micrographic surgery, layers of the lesion are removed with a scalpel or loop instrument and immediately examined under a microscope until all of the abnormal or cancerous tissue is removed. This procedure is  minimally invasive and ensures the best cosmetic outcome, with removal of as little normal tissue as possible. Mohs is usually done to treat skin cancer, such as basal cell carcinoma or squamous cell carcinoma, particularly on the face and ears. Antibiotic ointment is applied to the surgical area after each of the procedures listed above, as necessary. AFTER THE PROCEDURE  How well you heal depends on many factors. Most patients heal quite well with proper techniques and self-care. Scarring will lessen over time. HOME CARE INSTRUCTIONS   Take medicines for pain as directed.  Keep the incision area clean, dry, and protected for at least 48 hours. Change dressings as directed.  For bleeding, apply gentle but firm pressure to the wound using a folded towel for 20 minutes. Call your caregiver if bleeding does not stop.  Avoid high-impact exercise and activities until the stitches are removed or the area heals.  Follow your caregiver's instructions to minimize scarring. Avoid sun exposure until the area has healed. Scarring should lessen over time.  Follow up with your caregiver as directed. Removal of stitches within 4 to 14 days may be necessary. Finding out the results of your test Not all test results are available during your visit. If your test results are not back during the visit, make an appointment with your caregiver to find out the results. Do not assume everything is normal if you have not heard from your caregiver or the medical facility. It is important for you to follow up on all of your test results. SEEK MEDICAL CARE IF:   You or your child has an oral temperature above 102 F (38.9 C).  You develop signs of infection (chills, feeling unwell).  You notice bleeding, pain, discharge, redness, or swelling at the incision site.  You notice skin irregularities or changes in sensation. MAKE SURE YOU:   Understand these instructions.  Will watch your condition.  Will get help  right away if you are not doing well or get worse. FOR MORE INFORMATION  American Academy [employer]: www.AromatherapyParty.no American Academy of Dermatology: http://jones-macias.info/ Document Released: 08/17/2009 Document Revised: 08/15/2011 Document Reviewed: 08/17/2009 [facility] Patient Information 2015 [place], [place]. This information is not intended to replace advice given to you by your health care provider. Make sure you discuss any questions you have with your health care provider.   Post Anesthesia Home Care Instructions  Activity: Get plenty of rest for the remainder of the day. A responsible adult should stay with you for 24 hours following the procedure.  For the next 24 hours, DO NOT: -Drive a car -Paediatric nurse -Drink alcoholic beverages -Take any medication unless instructed by your physician -Make any legal decisions or sign important papers.  Meals: Start with liquid foods such as gelatin or soup. Progress to regular foods as tolerated. Avoid greasy, spicy, heavy foods. If nausea and/or vomiting occur, drink only clear liquids until the nausea and/or vomiting subsides. Call your physician if vomiting continues.  Special Instructions/Symptoms: Your throat may feel dry or sore from the anesthesia or the breathing tube placed in your throat during surgery. If this causes discomfort, gargle with warm salt water. The discomfort should disappear within 24 hours.  If you had a scopolamine patch placed behind your ear for the management of post- operative nausea  and/or vomiting:  1. The medication in the patch is effective for 72 hours, after which it should be removed.  Wrap patch in a tissue and discard in the trash. Wash hands thoroughly with soap and water. 2. You may remove the patch earlier than 72 hours if you experience unpleasant side effects which may include dry mouth, dizziness or visual disturbances. 3. Avoid touching the patch. Wash your hands with soap and water after  contact with the patch.

## 2015-01-28 NOTE — Transfer of Care (Signed)
Immediate Anesthesia Transfer of Care Note  Patient: Shelly [name]  Procedure(s) Performed: Procedure(s): WIDE EXCISION VULVECTOMY (N/A)  Patient Location: PACU  Anesthesia Type:General  Level of Consciousness: awake, alert  and oriented  Airway & Oxygen Therapy: Patient Spontanous Breathing and Patient connected to nasal cannula oxygen  Post-op Assessment: Report given to RN, Post -op Vital signs reviewed and stable and Patient moving all extremities  Post vital signs: Reviewed and stable  Last Vitals:  Filed Vitals:   01/28/15 1149  BP: 174/09    Complications: No apparent anesthesia complications

## 2015-01-29 ENCOUNTER — Encounter (HOSPITAL_COMMUNITY): Payer: Self-pay | Admitting: Obstetrics and Gynecology

## 2015-01-29 NOTE — Op Note (Signed)
[name], [name] NO.:  [id number]  MEDICAL RECORD NO.:  [id number]  LOCATION:                                 FACILITY:  PHYSICIAN:  Jola Schmidt, MD        DATE OF BIRTH:  DATE OF PROCEDURE:  01/28/2015 DATE OF DISCHARGE:                              OPERATIVE REPORT   PREOPERATIVE DIAGNOSIS:  Vulvar intraepithelial neoplasia III/carcinoma in situ.  POSTOPERATIVE DIAGNOSIS:  Vulvar intraepithelial neoplasia III/carcinoma in situ.  PROCEDURE:  Wide local excision.  SURGEON:  Jola Schmidt, M.D.  ASSISTANT:  Technician.  ANESTHESIA:  General (LMA).  ESTIMATED BLOOD LOSS:  5.  URINE OUTPUT:  200.  LOCAL:  With lidocaine 1% with epinephrine.  SPECIMENS:  Right labial lesion.  Disposition of specimens to Pathology.  [name] DISPOSITION:  To PACU, hemodynamically stable.  COMPLICATIONS:  None.  FINDINGS:  Healing site of punch biopsy with small amount of echogenic rim.  PROCEDURE IN DETAIL:  Ms. Shelly [name] was identified in the holding area. She was then taken to the operating room.  She is an amputee on the right side, below the knee amputee.  After [name] underwent LMA anesthesia, her prosthesis was removed as [name] had shown me in holding area and her stump was placed in the [place]. Comfortably without any compression on the stump, padding was applied.  [name] was prepped and draped in a normal sterile fashion.  Time- out was taken.  She did not receive preop antibiotics.  The incision was then marked with 1 cm around the whole lesion and it was done in an elliptical fashion.  The lidocaine with epinephrine was then injected deep and around the margins.  An 11 blade scalpel was then used to outline the lesion and Allis clamps were grasped to remove the lesion.  The subcutaneous space a little deep in the inner right corner.  Once that was removed, cautery was used for hemostasis as well as tamponade.  The 2  interrupted sutures that were placed in the bottom layer, second running layer of 3-0 Monocryl was used for approximation and then the skin was reapproximated with 4-0 Monocryl in continuous running fashion.  Pressure was applied.  The lesion was hemostatic at the end of the case.  All instruments, sponge, and needle counts were correct x3.  [name] tolerated the procedure well.  She went to the recovery room in stable condition.     Jola Schmidt, MD     EBV/MEDQ  D:  01/28/2015  T:  01/29/2015  Job:  [id number]

## 2015-01-30 ENCOUNTER — Encounter: Payer: Self-pay | Admitting: Cardiology

## 2015-01-30 ENCOUNTER — Ambulatory Visit (INDEPENDENT_AMBULATORY_CARE_PROVIDER_SITE_OTHER): Payer: 59 | Admitting: Cardiology

## 2015-01-30 VITALS — BP 138/90 | HR 86 | Ht 68.0 in | Wt 206.0 lb

## 2015-01-30 DIAGNOSIS — I2510 Atherosclerotic heart disease of native coronary artery without angina pectoris: Secondary | ICD-10-CM

## 2015-01-30 DIAGNOSIS — E785 Hyperlipidemia, unspecified: Secondary | ICD-10-CM | POA: Diagnosis not present

## 2015-01-30 DIAGNOSIS — Z79899 Other long term (current) drug therapy: Secondary | ICD-10-CM

## 2015-01-30 DIAGNOSIS — R609 Edema, unspecified: Secondary | ICD-10-CM

## 2015-01-30 MED ORDER — POTASSIUM CHLORIDE ER 10 MEQ PO TBCR: 10.0000 meq | EXTENDED_RELEASE_TABLET | Freq: Every day | ORAL | Status: DC

## 2015-01-30 MED ORDER — HYDROCHLOROTHIAZIDE 25 MG PO TABS: 25.0000 mg | ORAL_TABLET | Freq: Every day | ORAL | Status: DC

## 2015-01-30 NOTE — Progress Notes (Signed)
Cardiology Office Note   Date:  01/30/2015   ID:  Shelly, [name] 1950/08/03, MRN [id number]  PCP:  Reginia Naas, MD  Cardiologist:  Dr. Irish Lack    Chief Complaint  Patient presents with  . Follow-up    patient already had surgery. Dr Simona Huh recommended she have a check-up. patient reports edema, otherwise, no complaints.      History of Present Illness: Shelly [name] is a 64 y.o. female who presents for concerns about CAD.  Recently her brother developed SOB with walking up steps.   He was seeing his MD the next day -led to cardiology visit and 99% stenosis in 3 vessels going from cath lab to OR.  Pt now concerned about her risk.  As noted per Dr. Irish Lack she had atherosclerosis on CT scan of her LAD and Stress test was done which was negative for ischemia.   She just wants to make sure she is doing ok.  She hs no chest pain and no SOB.  She is on a statin.  Her LDL was around 100 which is goal for pt without heart disease.  She has developed Lt lower ext edema recently and her BP is elevated today.  She has not had any increase in salt- we discussed avoidance of processed foods. She is BKA on the Rt due to AVN.   Her wt is up.  Recent Vulvar intraepithelial neoplasia III/carcinoma in situ with local excision.     Past Medical History  Diagnosis Date  . Hyperlipidemia   . Leg edema     left  . Edema of foot     bilateral  . Depression   . Anxiety   . GERD (gastroesophageal reflux disease)   . Cancer     vulva  . PONV (postoperative nausea and vomiting)     Past Surgical History  Procedure Laterality Date  . Tonsillectomy    . Tubal ligation  1983  . Ankle surgery  2010    right cordicompression  . Ankle fusion  2011    right  . Below knee leg amputation    . Vulvectomy N/A 01/28/2015    Procedure: WIDE EXCISION VULVECTOMY;  Surgeon: Thurnell Lose, MD;  Location: [facility];  Service: Gynecology;  Laterality: N/A;     Current Outpatient  Prescriptions  Medication Sig Dispense Refill  . acyclovir cream (ZOVIRAX) 5 % Apply 1 application topically every 4 (four) hours as needed (fever blisters).    . B Complex-C (SUPER B COMPLEX) TABS Take 1 tablet by mouth daily.    Marland Kitchen buPROPion (WELLBUTRIN XL) 300 MG 24 hr tablet Take 300 mg by mouth daily.      . calcium carbonate (TUMS EX) 750 MG chewable tablet Chew 1 tablet by mouth 2 (two) times daily.    . CASCARA SAGRADA PO Take 2 capsules by mouth daily.      . chlorpheniramine (CHLOR-TRIMETON) 4 MG tablet Take 8 mg by mouth at bedtime.    . Cholecalciferol (SM VITAMIN D3) 4000 UNITS CAPS Take 1 capsule by mouth daily.    . cycloSPORINE (RESTASIS) 0.05 % ophthalmic emulsion Place 1 drop into both eyes 2 (two) times daily.    . DESONIDE EX Apply 1 application topically 2 (two) times daily as needed (irritation).    Marland Kitchen estradiol-norethindrone (MIMVEY) 1-0.5 MG per tablet Take 1 tablet by mouth daily. For hot flashes    . FLUoxetine (PROZAC) 20 MG capsule Take 20 mg by mouth daily.    Marland Kitchen  folic acid (FOLVITE) 827 MCG tablet Take 800 mcg by mouth daily.      Marland Kitchen ibuprofen (ADVIL,MOTRIN) 200 MG tablet Take 600-800 mg by mouth every 6 (six) hours as needed for headache or mild pain.    Marland Kitchen ibuprofen (ADVIL,MOTRIN) 600 MG tablet Take 1 tablet (600 mg total) by mouth every 6 (six) hours as needed. 30 tablet 0  . L-Lysine 1000 MG TABS Take 1 tablet by mouth daily.      Marland Kitchen lisdexamfetamine (VYVANSE) 60 MG capsule Take 60 mg by mouth every morning.    . metaxalone (SKELAXIN) 800 MG tablet Take 800 mg by mouth 3 (three) times daily as needed for muscle spasms.    . montelukast (SINGULAIR) 10 MG tablet Take 1 tablet (10 mg total) by mouth at bedtime. 30 tablet 11  . Multiple Vitamins-Minerals (CENTRUM SILVER PO) Take 1 tablet by mouth daily.      . naftifine (NAFTIN) 1 % cream Apply 1 application topically 2 (two) times daily as needed (irritation from prosthetic).    Marland Kitchen omeprazole (PRILOSEC) 20 MG capsule  Take 20 mg by mouth daily.    . Probiotic Product (PROBIOTIC DAILY PO) Take 1 capsule by mouth daily.    . simvastatin (ZOCOR) 20 MG tablet Take 20 mg by mouth at bedtime.      . traMADol (ULTRAM) 50 MG tablet 1-2 every 4 hours as needed for cough or pain 40 tablet 0  . valACYclovir (VALTREX) 1000 MG tablet Take 2,000 mg by mouth 2 (two) times daily. Take 2 tablets every 12 hours at onset of fever blisters.    . vitamin B-12 (CYANOCOBALAMIN) 1000 MCG tablet Take 2,500 mcg by mouth daily.     . hydrochlorothiazide (HYDRODIURIL) 25 MG tablet Take 1 tablet (25 mg total) by mouth daily. 90 tablet 3  . potassium chloride (K-DUR) 10 MEQ tablet Take 1 tablet (10 mEq total) by mouth daily. 90 tablet 3   No current facility-administered medications for this visit.    Allergies:   Review of patient's allergies indicates no known allergies.    Social History:  The patient  reports that she quit smoking about 7 years ago. Her smoking use included Cigarettes. She has a 40 pack-year smoking history. She has never used smokeless tobacco. She reports that she does not drink alcohol or use illicit drugs.   Family History:  The patient's family history includes Allergies in her mother; Breast cancer in her sister; Dementia (age of onset: 30) in her mother; Fibromyalgia in her mother; Heart attack (age of onset: 22) in her father; Heart disease in her brother; Heart murmur (age of onset: 79) in her sister; Multiple myeloma in her father; Other in her brother; Other (age of onset: 28) in her sister.    ROS:  General:no colds or fevers,  weight is up 6 lbs. Skin:no rashes or ulcers HEENT:no blurred vision, no congestion CV:see HPI PUL:see HPI GI:no diarrhea constipation or melena, no indigestion GU:no hematuria, no dysuria MS:no joint pain, no claudication Neuro:no syncope, no lightheadedness Endo:no diabetes, no thyroid disease  Wt Readings from Last 3 Encounters:  01/30/15 206 lb (93.441 kg)  01/14/15  200 lb (90.719 kg)  07/03/14 205 lb 1.9 oz (93.042 kg)     PHYSICAL EXAM: VS:  BP 138/90 mmHg  Pulse 86  Ht $R'5\' 8"'UB$  (1.727 m)  Wt 206 lb (93.441 kg)  BMI 31.33 kg/m2 , BMI Body mass index is 31.33 kg/(m^2). General:Pleasant affect, NAD Skin:Warm and dry,  brisk capillary refill HEENT:normocephalic, sclera clear, mucus membranes moist Neck:supple, no JVD, no bruits  Heart:S1S2 RRR without murmur, gallup, rub or click Lungs:clear without rales, rhonchi, or wheezes HAF:BXUX, non tender, + BS, do not palpate liver spleen or masses Ext:Lt lower ext edema to above ankle 1-2+, Rt BKA with prosthesis, 2+ pedal pulse, 2+ radial pulses Neuro:alert and oriented, MAE, follows commands, + facial symmetry    EKG:  EKG is ordered today. The ekg ordered today demonstrates SR 1st degree AV block, PR is 216 ms.    Recent Labs: 05/28/2014: BUN 15; Creatinine, Ser 0.9; Potassium 4.4; Sodium 137 01/14/2015: Hemoglobin 12.8; Platelets 238    Lipid Panel No results found for: CHOL, TRIG, HDL, CHOLHDL, VLDL, LDLCALC, LDLDIRECT     Other studies Reviewed: Additional studies/ records that were reviewed today include: stress test. Previous notes   ASSESSMENT AND PLAN:  1.  Coronary atherosclerosis on CT scan, negative stress test on statin.  Will check with Dr Irish Lack  If he has any further recommendations with new family hx of brother with CAD and requiring emergent CABG for severe disease.  2. Lower ext edema- I added HCTZ 25 mg daily and K dur 10 meq daily - she will have lab next week, decrease salt.  She will follow up with PCP, may be due to heat.    I made her appt with Dr. Irish Lack in 2-3 months but if any concerns or question she will call.  Reviewed if SOB with exertion, chest pain to please call.      Current medicines are reviewed with the patient today.  The patient Has no concerns regarding medicines.  The following changes have been made:  See above Labs/ tests ordered today  include:see above  Disposition:   FU:  see above  Lennie Muckle, NP  01/30/2015 6:00 PM    [facility], [address] [place], [place], [place] Phone: [phone]; Fax: [phone]

## 2015-01-30 NOTE — Patient Instructions (Signed)
Medication Instructions:  START Hydrochlorothiazide (HCTZ) 25 mg - take 1 tablet by mouth daily. START Potassium (K-DUR) 10 mEq - take 1 tablet by mouth daily. A new prescription has been sent to your pharmacy on file electronically.  Labwork: Your physician recommends that you return for lab work in one week. You do not need to be fasting.  Testing/Procedures: NONE  Follow-Up: Cecilie Kicks, NP, recommends that you schedule a follow-up appointment in 2-3 months with Dr Irish Lack.

## 2015-01-31 ENCOUNTER — Encounter: Payer: Self-pay | Admitting: Internal Medicine

## 2015-02-02 NOTE — Telephone Encounter (Signed)
Per email sent in today: Hi Dr. Melvyn Novas,  I saw you in December 2015 and January and Feb of 2016. I had a cough that started in August of 2015 that I couldn't get rid of. Ultimately the cough went away thanks to your expertise in things like this. I had several tests run during that time and after reading them over, there are a couple of things that I don't understand. I am hoping you could explain them to me.  CT MAXILLOFACIAL LIMITED W/O-DETAILS DATED 06/03/14  Under "findings" could you explain to me what the doctor means by this statement? "A BB is seen in the skin on the right in the peri orbital region. Do I have a BB under my skin?  CT CHEST W CONTRAST - DETAILS ALSO DATED 06/03/14  Under "Impression" #3 states "Advanced coronary artery atherosclerosis. Recommend assessment of coronary risk factors and consideration of medical therapy." Is something bad going on with my heart?  Thank you for your help with these questions. Best Regard, -----  Please advise Dr. Melvyn Novas thanks

## 2015-02-07 ENCOUNTER — Encounter (HOSPITAL_COMMUNITY): Payer: Self-pay | Admitting: *Deleted

## 2015-02-07 ENCOUNTER — Inpatient Hospital Stay (HOSPITAL_COMMUNITY)
Admission: AD | Admit: 2015-02-07 | Discharge: 2015-02-07 | Disposition: A | Discharge status: Home / Self Care | Payer: 59 | Source: Ambulatory Visit | Attending: Obstetrics & Gynecology | Admitting: Obstetrics & Gynecology

## 2015-02-07 DIAGNOSIS — Z87891 Personal history of nicotine dependence: Secondary | ICD-10-CM | POA: Diagnosis not present

## 2015-02-07 DIAGNOSIS — G8918 Other acute postprocedural pain: Secondary | ICD-10-CM | POA: Diagnosis not present

## 2015-02-07 DIAGNOSIS — Z4889 Encounter for other specified surgical aftercare: Secondary | ICD-10-CM

## 2015-02-07 HISTORY — DX: Idiopathic aseptic necrosis of unspecified ankle: M87.073

## 2015-02-07 MED ORDER — BENZOCAINE-MENTHOL 20-0.5 % EX AERO
1.0000 "application " | INHALATION_SPRAY | Freq: Four times a day (QID) | CUTANEOUS | Status: DC | PRN
  Filled 2015-02-07: qty 56

## 2015-02-07 NOTE — MAU Provider Note (Signed)
History     CSN: [id number]  Arrival date and time: 02/07/15 1602   First Provider Initiated Contact with Patient 02/07/15 1714      Chief Complaint  Patient presents with  . Post-op Problem   HPI   Ms.Shelly [name] is a 64 y.o. female [phone] She is status post Right labial Wide local excision on 8/24 with a post operative diagnosis of vulvar intraepithelial neoplasia III/carcinoma in situ. She presents to MAU today with concerns about her incision; she feels that the incision has split open. She noticed this last night. For the last few days it has been uncomfortable in the incision site making it difficult to sit.   She has not taken anything for pain. She denies fever.    OB History    Gravida Para Term Preterm AB TAB SAB Ectopic Multiple Living   '3 3 3       3      '$ Past Medical History  Diagnosis Date  . Hyperlipidemia   . Leg edema     left  . Edema of foot     bilateral  . Depression   . Anxiety   . GERD (gastroesophageal reflux disease)   . Cancer     vulva  . PONV (postoperative nausea and vomiting)   . Avascular necrosis of talus     Past Surgical History  Procedure Laterality Date  . Tonsillectomy    . Tubal ligation  1983  . Ankle surgery  2010    right cordicompression  . Ankle fusion  2011    right  . Below knee leg amputation    . Vulvectomy N/A 01/28/2015    Procedure: WIDE EXCISION VULVECTOMY;  Surgeon: Thurnell Lose, MD;  Location: [facility];  Service: Gynecology;  Laterality: N/A;  . Skin graft      Family History  Problem Relation Age of Onset  . Dementia Mother 40    alive  . Fibromyalgia Mother   . Allergies Mother   . Heart attack Father 53    deceased  . Multiple myeloma Father   . Other Brother     alive  . Heart disease Brother     emergent CABG for 99% blocked CAD  . Heart murmur Sister 73    she had open heart surgery  . Other Sister 75    She is bIpolar- diabetic  . Breast cancer Sister     Social History    Substance Use Topics  . Smoking status: Former Smoker -- 1.00 packs/day for 40 years    Types: Cigarettes    Quit date: 02/05/2007  . Smokeless tobacco: Never Used  . Alcohol Use: No    Allergies: No Known Allergies  Prescriptions prior to admission  Medication Sig Dispense Refill Last Dose  . acyclovir cream (ZOVIRAX) 5 % Apply 1 application topically every 4 (four) hours as needed (fever blisters).   prn  . B Complex-C (SUPER B COMPLEX) TABS Take 1 tablet by mouth daily.   02/07/2015 at Unknown time  . buPROPion (WELLBUTRIN XL) 300 MG 24 hr tablet Take 300 mg by mouth daily.     02/07/2015 at Unknown time  . calcium carbonate (TUMS EX) 750 MG chewable tablet Chew 1 tablet by mouth 2 (two) times daily.   prn  . CASCARA SAGRADA PO Take 2 capsules by mouth daily.     02/07/2015 at Unknown time  . chlorpheniramine (CHLOR-TRIMETON) 4 MG tablet Take 8 mg by mouth at  bedtime.   02/06/2015 at Unknown time  . Cholecalciferol (SM VITAMIN D3) 4000 UNITS CAPS Take 1 capsule by mouth daily.   02/07/2015 at Unknown time  . cycloSPORINE (RESTASIS) 0.05 % ophthalmic emulsion Place 1 drop into both eyes 2 (two) times daily.   02/07/2015 at Unknown time  . DESONIDE EX Apply 1 application topically 2 (two) times daily as needed (irritation).   Past Week at Unknown time  . estradiol-norethindrone (MIMVEY) 1-0.5 MG per tablet Take 1 tablet by mouth daily. For hot flashes   02/07/2015 at Unknown time  . FLUoxetine (PROZAC) 20 MG capsule Take 20 mg by mouth daily.   02/07/2015 at Unknown time  . folic acid (FOLVITE) 299 MCG tablet Take 800 mcg by mouth daily.     02/07/2015 at Unknown time  . hydrochlorothiazide (HYDRODIURIL) 25 MG tablet Take 1 tablet (25 mg total) by mouth daily. 90 tablet 3 02/07/2015 at Unknown time  . ibuprofen (ADVIL,MOTRIN) 600 MG tablet Take 1 tablet (600 mg total) by mouth every 6 (six) hours as needed. 30 tablet 0 two weeks  . L-Lysine 1000 MG TABS Take 1 tablet by mouth daily.     02/07/2015 at Unknown  time  . lisdexamfetamine (VYVANSE) 60 MG capsule Take 60 mg by mouth every morning.   02/07/2015 at Unknown time  . metaxalone (SKELAXIN) 800 MG tablet Take 800 mg by mouth 3 (three) times daily as needed for muscle spasms.   Past Month at Unknown time  . montelukast (SINGULAIR) 10 MG tablet Take 1 tablet (10 mg total) by mouth at bedtime. 30 tablet 11 02/06/2015 at Unknown time  . Multiple Vitamins-Minerals (CENTRUM SILVER PO) Take 1 tablet by mouth daily.     02/07/2015 at Unknown time  . naftifine (NAFTIN) 1 % cream Apply 1 application topically 2 (two) times daily as needed (irritation from prosthetic).   Past Week at Unknown time  . omeprazole (PRILOSEC) 20 MG capsule Take 20 mg by mouth daily.   02/07/2015 at Unknown time  . potassium chloride (K-DUR) 10 MEQ tablet Take 1 tablet (10 mEq total) by mouth daily. 90 tablet 3 02/07/2015 at Unknown time  . Probiotic Product (PROBIOTIC DAILY PO) Take 1 capsule by mouth daily.   02/07/2015 at Unknown time  . simvastatin (ZOCOR) 20 MG tablet Take 20 mg by mouth at bedtime.     02/06/2015 at Unknown time  . traMADol (ULTRAM) 50 MG tablet 1-2 every 4 hours as needed for cough or pain 40 tablet 0 two weeks  . valACYclovir (VALTREX) 1000 MG tablet Take 2,000 mg by mouth 2 (two) times daily. Take 2 tablets every 12 hours at onset of fever blisters.   prn  . vitamin B-12 (CYANOCOBALAMIN) 1000 MCG tablet Take 2,500 mcg by mouth daily.    02/07/2015 at Unknown time   No results found for this or any previous visit (from the past 72 hour(s)).   Review of Systems  Constitutional: Negative for fever and chills.  Genitourinary: Negative for dysuria.   Physical Exam   Blood pressure 125/63, pulse 89, temperature 98.3 F (36.8 C), temperature source Oral, resp. rate 18.  Physical Exam  Constitutional: She is oriented to person, place, and time. She appears well-developed and well-nourished. No distress.  HENT:  Head: Normocephalic.  Respiratory: Effort normal.    Genitourinary:     Neurological: She is alert and oriented to person, place, and time.  Skin: Skin is warm. She is not diaphoretic.  Psychiatric: Her  behavior is normal.    MAU Course  Procedures  None  MDM Discussed patient with Dr. Charlesetta Garibaldi. There is no sign of infection. Patient denies the need for pain medication at this time. Patient given Dermoplast to use at home PRN.   Assessment and Plan   A:  1. Post-op pain   2. Encounter for postoperative wound check    P:  Discharge home in stable condition Peri Care discussed Follow up in the office with Dr. Simona Huh on Tuesday; call to schedule Return to MAU with any fever, chills Dermoplast PRN   Lezlie Lye, NP 02/09/2015 8:14 AM

## 2015-02-07 NOTE — MAU Note (Addendum)
C/o Stitches coming out and incision is open; vulva mass removed on august 24th; has a R leg amputation below the knee;

## 2015-02-13 LAB — BASIC METABOLIC PANEL
BUN: 19 mg/dL (ref 7–25)
CO2: 27 mmol/L (ref 20–31)
Calcium: 9.4 mg/dL (ref 8.6–10.4)
Chloride: 103 mmol/L (ref 98–110)
Creat: 0.83 mg/dL (ref 0.50–0.99)
Glucose, Bld: 106 mg/dL — ABNORMAL HIGH (ref 65–99)
Potassium: 3.9 mmol/L (ref 3.5–5.3)
Sodium: 137 mmol/L (ref 135–146)

## 2015-02-13 LAB — MAGNESIUM: Magnesium: 1.7 mg/dL (ref 1.5–2.5)

## 2015-04-21 ENCOUNTER — Encounter: Payer: Self-pay | Admitting: *Deleted

## 2015-04-24 ENCOUNTER — Encounter: Payer: Self-pay | Admitting: Cardiology

## 2015-04-24 ENCOUNTER — Ambulatory Visit (INDEPENDENT_AMBULATORY_CARE_PROVIDER_SITE_OTHER): Payer: 59 | Admitting: Cardiology

## 2015-04-24 VITALS — BP 136/86 | HR 101 | Ht 68.0 in | Wt 206.8 lb

## 2015-04-24 DIAGNOSIS — Z8709 Personal history of other diseases of the respiratory system: Secondary | ICD-10-CM | POA: Diagnosis not present

## 2015-04-24 DIAGNOSIS — I2510 Atherosclerotic heart disease of native coronary artery without angina pectoris: Secondary | ICD-10-CM

## 2015-04-24 DIAGNOSIS — Z23 Encounter for immunization: Secondary | ICD-10-CM | POA: Diagnosis not present

## 2015-04-24 DIAGNOSIS — R0683 Snoring: Secondary | ICD-10-CM | POA: Diagnosis not present

## 2015-04-24 DIAGNOSIS — G4730 Sleep apnea, unspecified: Secondary | ICD-10-CM | POA: Diagnosis not present

## 2015-04-24 DIAGNOSIS — E785 Hyperlipidemia, unspecified: Secondary | ICD-10-CM

## 2015-04-24 NOTE — Progress Notes (Signed)
Cardiology Office Note   Date:  04/24/2015   ID:  Shelly [name], [name] March 24, 1951, MRN [id number]  PCP:  Allean Found, MD  Cardiologist:  Dr, Eldridge Dace     Chief Complaint  Patient presents with  . Coronary Artery Disease    neg stress test      History of Present Illness: Shelly [name] is a 64 y.o. female who presents for follow up of HTN and cardiac eval.   She has a hx of atherosclerosis on CT scan of her LAD and Stress test was done which was negative for ischemia. Dr Eldridge Dace felt she was stable.  She does have family hx. Of CAD recently with her brother.  She has been reassured by Dr. Sherene Sires and our office that her tests were normal.  She had a right BKA for right ankle Avascular necrosis.   On last visit with lower ext edema, started HCTZ with improvement but now with support stocking on Lt as well.  Improved.    Today no complaints of chest pain or SOB.  She does have neck pain with lt shoulder numbness and tingling. She has talked with PCP but adjustments have not helped. I asked her to see her orthopedist or follow up with PCP.  But with neck pain she takes 5 Advil at times, instructed that dose is too high!  Her BP is elevated today may be due to advil.   She complains of falling asleep/very drowsy with driving.  She does snore.  Will do sleep study.    Past Medical History  Diagnosis Date  . Hyperlipidemia   . Leg edema     left  . Edema of foot     bilateral  . Depression   . Anxiety   . GERD (gastroesophageal reflux disease)   . Cancer (HCC)     vulva  . PONV (postoperative nausea and vomiting)   . Avascular necrosis of talus [facility])     Past Surgical History  Procedure Laterality Date  . Tonsillectomy    . Tubal ligation  1983  . Ankle surgery  2010    right cordicompression  . Ankle fusion  2011    right  . Below knee leg amputation    . Vulvectomy N/A 01/28/2015    Procedure: WIDE EXCISION VULVECTOMY;  Surgeon: [name], MD;   Location: [facility];  Service: Gynecology;  Laterality: N/A;  . Skin graft       Current Outpatient Prescriptions  Medication Sig Dispense Refill  . acyclovir cream (ZOVIRAX) 5 % Apply 1 application topically every 4 (four) hours as needed (fever blisters).    . B Complex-C (SUPER B COMPLEX) TABS Take 1 tablet by mouth daily.    Marland Kitchen buPROPion (WELLBUTRIN XL) 300 MG 24 hr tablet Take 300 mg by mouth daily.      . calcium carbonate (TUMS EX) 750 MG chewable tablet Chew 1 tablet by mouth 2 (two) times daily.    . CASCARA SAGRADA PO Take 2 capsules by mouth daily.      . chlorpheniramine (CHLOR-TRIMETON) 4 MG tablet Take 8 mg by mouth at bedtime.    . Cholecalciferol (SM VITAMIN D3) 4000 UNITS CAPS Take 1 capsule by mouth daily.    . DESONIDE EX Apply 1 application topically 2 (two) times daily as needed (irritation).    Marland Kitchen estradiol-norethindrone (MIMVEY) 1-0.5 MG per tablet Take 1 tablet by mouth daily. For hot flashes    . FLUoxetine (PROZAC) 20 MG  capsule Take 20 mg by mouth daily.    . folic acid (FOLVITE) 248 MCG tablet Take 800 mcg by mouth daily.      . hydrochlorothiazide (HYDRODIURIL) 25 MG tablet Take 1 tablet (25 mg total) by mouth daily. 90 tablet 3  . ibuprofen (ADVIL,MOTRIN) 600 MG tablet Take 1 tablet (600 mg total) by mouth every 6 (six) hours as needed. 30 tablet 0  . L-Lysine 1000 MG TABS Take 1 tablet by mouth daily.      Marland Kitchen lisdexamfetamine (VYVANSE) 70 MG capsule Take 70 mg by mouth daily.    . metaxalone (SKELAXIN) 800 MG tablet Take 800 mg by mouth 3 (three) times daily as needed for muscle spasms.    . montelukast (SINGULAIR) 10 MG tablet Take 1 tablet (10 mg total) by mouth at bedtime. 30 tablet 11  . Multiple Vitamins-Minerals (CENTRUM SILVER PO) Take 1 tablet by mouth daily.      . naftifine (NAFTIN) 1 % cream Apply 1 application topically 2 (two) times daily as needed (irritation from prosthetic).    Marland Kitchen omeprazole (PRILOSEC) 20 MG capsule Take 20 mg by mouth daily.    .  potassium chloride (K-DUR) 10 MEQ tablet Take 1 tablet (10 mEq total) by mouth daily. 90 tablet 3  . Probiotic Product (PROBIOTIC DAILY PO) Take 1 capsule by mouth daily.    . simvastatin (ZOCOR) 20 MG tablet Take 20 mg by mouth at bedtime.      . traMADol (ULTRAM) 50 MG tablet 1-2 every 4 hours as needed for cough or pain 40 tablet 0  . valACYclovir (VALTREX) 1000 MG tablet Take 2,000 mg by mouth 2 (two) times daily. Take 2 tablets every 12 hours at onset of fever blisters.    . vitamin B-12 (CYANOCOBALAMIN) 1000 MCG tablet Take 2,500 mcg by mouth daily.      No current facility-administered medications for this visit.    Allergies:   Review of patient's allergies indicates no known allergies.    Social History:  The patient  reports that she quit smoking about 8 years ago. Her smoking use included Cigarettes. She has a 40 pack-year smoking history. She has never used smokeless tobacco. She reports that she does not drink alcohol or use illicit drugs.   Family History:  The patient's family history includes Allergies in her mother; Breast cancer in her sister; Dementia (age of onset: 41) in her mother; Fibromyalgia in her mother; Heart attack (age of onset: 82) in her father; Heart disease in her brother; Heart murmur (age of onset: 29) in her sister; Multiple myeloma in her father; Other in her brother; Other (age of onset: 9) in her sister.    ROS:  General:no colds or fevers, no weight changes Skin:no rashes or ulcers HEENT:no blurred vision, no congestion CV:see HPI PUL:see HPI GI:no diarrhea constipation or melena, no indigestion GU:no hematuria, no dysuria MS:no joint pain, no claudication- + neck pain Neuro:no syncope, no lightheadedness Endo:no diabetes, no thyroid disease  Wt Readings from Last 3 Encounters:  04/24/15 206 lb 12.8 oz (93.804 kg)  01/30/15 206 lb (93.441 kg)  01/14/15 200 lb (90.719 kg)     PHYSICAL EXAM: VS:  BP 136/86 mmHg  Pulse 101  Ht $R'5\' 8"'eI$  (1.727  m)  Wt 206 lb 12.8 oz (93.804 kg)  BMI 31.45 kg/m2  SpO2 98% , BMI Body mass index is 31.45 kg/(m^2). General:Pleasant affect, NAD Skin:Warm and dry, brisk capillary refill HEENT:normocephalic, sclera clear, mucus membranes moist Neck:supple,  no JVD, no bruits  Heart:S1S2 RRR without murmur, gallup, rub or click Lungs:clear without rales, rhonchi, or wheezes FUX:NATF, non tender, + BS, do not palpate liver spleen or masses Ext:no lower ext edema,support stocking on lt foot, rt BKA, , 2+ radial pulses Neuro:alert and oriented, X 3 MAE, follows commands, + facial symmetry    EKG:  EKG is ordered today. The ekg ordered today demonstrates SR 1st degree AV block, borderline, no acute changes from 01/2015.   Recent Labs: 01/14/2015: Hemoglobin 12.8; Platelets 238 02/12/2015: BUN 19; Creat 0.83; Magnesium 1.7; Potassium 3.9; Sodium 137    Lipid Panel No results found for: CHOL, TRIG, HDL, CHOLHDL, VLDL, LDLCALC, LDLDIRECT     Other studies Reviewed: Additional studies/ records that were reviewed today include: previous notes.labs..   ASSESSMENT AND PLAN:  1. CAD on CT scan but neg. ETT. No chest pain. Reassured  Follow up with Dr. Irish Lack in 6 months.  2. HTN up today but with neck pain and freq ibuprofen she will monitor  3. Drowsiness + snoring, will plan sleep study  4. Neck pain, plan to see ortho and decrease use of advil.  occ dizziness and off balance but may be related to her neck pain.  She has had Pneumonia vaccine and is requesting flu vaccine. Which we will give.     Current medicines are reviewed with the patient today.  The patient Has no concerns regarding medicines.  The following changes have been made:  See above Labs/ tests ordered today include:see above  Disposition:   FU:  see above  Signed, Isaiah Serge, NP  04/24/2015 1:14 PM    [facility] [place], [place], [place], [place] Phone: [phone]; Fax: [phone]

## 2015-04-24 NOTE — Patient Instructions (Addendum)
Medication Instructions:  Your physician recommends that you continue on your current medications as directed. Please refer to the Current Medication list given to you today.   Labwork: None ordered  Testing/Procedures: Your physician has recommended that you have a sleep study. This test records several body functions during sleep, including: brain activity, eye movement, oxygen and carbon dioxide blood levels, heart rate and rhythm, breathing rate and rhythm, the flow of air through your mouth and nose, snoring, body muscle movements, and chest and belly movement.  Someone from our office will contact you to get this scheduled once they receive authorization from your insurance company.    Follow-Up: Your physician wants you to follow-up in: [place] DR. VARANASI.  You will receive a reminder letter in the mail two months in advance. If you don't receive a letter, please call our office to schedule the follow-up appointment.   Any Other Special Instructions Will Be Listed Below (If Applicable).  PLEASE CALL AND MAKE AN APPOINTMENT WITH AN ORTHOPEDIST FOR YOUR NECK PAIN   If you need a refill on your cardiac medications before your next appointment, please call your pharmacy.

## 2015-05-04 ENCOUNTER — Encounter: Payer: Self-pay | Admitting: Interventional Cardiology

## 2015-05-21 ENCOUNTER — Other Ambulatory Visit: Payer: Self-pay | Admitting: Cardiology

## 2015-05-21 MED ORDER — POTASSIUM CHLORIDE ER 10 MEQ PO TBCR: 10.0000 meq | EXTENDED_RELEASE_TABLET | Freq: Every day | ORAL | Status: DC

## 2015-05-21 MED ORDER — HYDROCHLOROTHIAZIDE 25 MG PO TABS: 25.0000 mg | ORAL_TABLET | Freq: Every day | ORAL | Status: DC

## 2015-06-05 ENCOUNTER — Other Ambulatory Visit: Payer: Self-pay | Admitting: Internal Medicine

## 2015-06-22 ENCOUNTER — Other Ambulatory Visit: Payer: Self-pay | Admitting: Internal Medicine

## 2015-06-26 ENCOUNTER — Encounter: Payer: Self-pay | Admitting: Cardiology

## 2015-06-30 NOTE — Telephone Encounter (Signed)
Shelly [name] [phone] Per [place] is not rerquired for CPT [id number] attended sleep study. I called [name] to let her know that she was all set for 2/1 sleep study and no precert was required. She was very Patent attorney. Just FYI.

## 2015-07-08 ENCOUNTER — Ambulatory Visit (HOSPITAL_BASED_OUTPATIENT_CLINIC_OR_DEPARTMENT_OTHER): Payer: 59 | Attending: Cardiology | Admitting: Radiology

## 2015-07-08 DIAGNOSIS — R0683 Snoring: Secondary | ICD-10-CM

## 2015-07-08 DIAGNOSIS — I493 Ventricular premature depolarization: Secondary | ICD-10-CM | POA: Insufficient documentation

## 2015-07-08 DIAGNOSIS — G4733 Obstructive sleep apnea (adult) (pediatric): Secondary | ICD-10-CM | POA: Diagnosis not present

## 2015-07-08 DIAGNOSIS — G4736 Sleep related hypoventilation in conditions classified elsewhere: Secondary | ICD-10-CM | POA: Insufficient documentation

## 2015-07-08 DIAGNOSIS — Z79899 Other long term (current) drug therapy: Secondary | ICD-10-CM | POA: Insufficient documentation

## 2015-07-08 DIAGNOSIS — G4730 Sleep apnea, unspecified: Secondary | ICD-10-CM

## 2015-07-08 DIAGNOSIS — R5383 Other fatigue: Secondary | ICD-10-CM | POA: Insufficient documentation

## 2015-07-16 ENCOUNTER — Telehealth: Payer: Self-pay | Admitting: Cardiology

## 2015-07-16 NOTE — Telephone Encounter (Signed)
Please let patient know that they have sleep apnea and recommend CPAP titration. Please set up titration in the sleep lab. 

## 2015-07-16 NOTE — Telephone Encounter (Signed)
Patient informed of information. Stated verbal understanding.   Okay to proceed with titration.  I will precert with insurance then schedule.  Patient is aware that she will get a letter in the mail when it is scheduled.

## 2015-07-16 NOTE — Sleep Study (Cosign Needed)
   Patient Name: Shelly [name], Shelly [name] MRN: [id number] Study Date: 07/08/2015 Gender: Female D.O.B: 10/10/1950 Age (years): 64 Referring Provider: Cecilie Kicks Interpreting Physician: Fransico Him MD, ABSM RPSGT: Laren Everts  Weight (lbs): 197 BMI: 30 Height (inches): 68 Neck Size: 15.00  CLINICAL INFORMATION Sleep Study Type: NPSG Indication for sleep study: Fatigue, OSA, Snoring Epworth Sleepiness Score: 20  SLEEP STUDY TECHNIQUE As per the AASM Manual for the Scoring of Sleep and Associated Events v2.3 (April 2016) with a hypopnea requiring 4% desaturations. The channels recorded and monitored were frontal, central and occipital EEG, electrooculogram (EOG), submentalis EMG (chin), nasal and oral airflow, thoracic and abdominal wall motion, anterior tibialis EMG, snore microphone, electrocardiogram, and pulse oximetry.  MEDICATIONS Patient's medications include: ZOCOR, PRILOSEC, CHLORPHENIRAMINE, SINGULAIR, MAGNESIUM, Wellbutrin, Vit D3, Prozac, Folvite, Skelaxin, Estradiol, HCTZ, Advil, Vyvanse, KCl, Tramadol. Medications self-administered by patient during sleep study : No sleep medicine administered.  SLEEP ARCHITECTURE The study was initiated at 10:40:00 PM and ended at 4:48:36 AM. Sleep onset time was 27.8 minutes and the sleep efficiency was reduced at 77.7%. The total sleep time was 286.5 minutes. Stage REM latency was prolonged at 197.0 minutes. The patient spent 14.49% of the night in stage N1 sleep, 81.15% in stage N2 sleep, 0.00% in stage N3 and 4.36% in REM. Alpha intrusion was absent. Supine sleep was 94.94%.  RESPIRATORY PARAMETERS The overall apnea/hypopnea index (AHI) was 6.7 per hour. There were 0 total apneas, including 0 obstructive, 0 central and 0 mixed apneas. There were 32 hypopneas and 34 RERAs. The AHI during Stage REM sleep was 38.4 per hour. AHI while supine was 7.1 per hour. The mean oxygen saturation was 91.69%. The minimum SpO2 during sleep  was 76.00%.  Time spent with oxygen saturations < 88% was 10.3 minutes.   Soft snoring was noted during this study.  CARDIAC DATA The 2 lead EKG demonstrated sinus rhythm. The mean heart rate was 87.49 beats per minute. Other EKG findings include: PVCs.  LEG MOVEMENT DATA The total PLMS were 352 with a resulting PLMS index of 73.72. Associated arousal with leg movement index was 10.5 .  IMPRESSIONS - Mild obstructive sleep apnea occurred during this study (AHI = 6.7/h). - No significant central sleep apnea occurred during this study (CAI = 0.0/h). - Moderate oxygen desaturation was noted during this study (Min O2 = 76.00%). - The patient snored with Soft snoring volume. - EKG findings include PVCs. - Severe periodic limb movements of sleep occurred during the study. Associated arousals were significant.  DIAGNOSIS - Obstructive Sleep Apnea (327.23 [G47.33 ICD-10]) - Nocturnal Hypoxemia (327.26 [G47.36 ICD-10])  RECOMMENDATIONS - Positional therapy avoiding supine position during sleep. - Patient has mild obstructive sleep apnea overall but severe during REM sleep with significant oxygen desaturations.  Given underlying cardiac history and significant excessive daytime sleepiness, recommend proceeding with CPAP titration. - Avoid alcohol, sedatives and other CNS depressants that may worsen sleep apnea and disrupt normal sleep architecture. - Sleep hygiene should be reviewed to assess factors that may improve sleep quality. - Weight management and regular exercise should be initiated or continued if appropriate.  [place], American Board of Sleep Medicine  ELECTRONICALLY SIGNED ON:  07/16/2015, 12:15 PM [place] PH: [phone]   FX: [phone] [place]

## 2015-07-17 ENCOUNTER — Other Ambulatory Visit: Payer: Self-pay | Admitting: *Deleted

## 2015-07-17 ENCOUNTER — Encounter: Payer: Self-pay | Admitting: *Deleted

## 2015-07-17 DIAGNOSIS — G4733 Obstructive sleep apnea (adult) (pediatric): Secondary | ICD-10-CM

## 2015-08-20 ENCOUNTER — Ambulatory Visit: Payer: Self-pay | Admitting: Physician Assistant

## 2015-09-03 ENCOUNTER — Encounter (HOSPITAL_COMMUNITY): Payer: Self-pay

## 2015-09-03 ENCOUNTER — Encounter (HOSPITAL_COMMUNITY)
Admission: RE | Admit: 2015-09-03 | Discharge: 2015-09-03 | Disposition: A | Discharge status: Home / Self Care | Payer: 59 | Source: Ambulatory Visit | Attending: Orthopedic Surgery | Admitting: Orthopedic Surgery

## 2015-09-03 DIAGNOSIS — E785 Hyperlipidemia, unspecified: Secondary | ICD-10-CM | POA: Diagnosis not present

## 2015-09-03 DIAGNOSIS — M5127 Other intervertebral disc displacement, lumbosacral region: Secondary | ICD-10-CM | POA: Diagnosis not present

## 2015-09-03 DIAGNOSIS — Z89511 Acquired absence of right leg below knee: Secondary | ICD-10-CM | POA: Insufficient documentation

## 2015-09-03 DIAGNOSIS — Z87891 Personal history of nicotine dependence: Secondary | ICD-10-CM | POA: Insufficient documentation

## 2015-09-03 DIAGNOSIS — Z01818 Encounter for other preprocedural examination: Secondary | ICD-10-CM | POA: Diagnosis not present

## 2015-09-03 DIAGNOSIS — Z01812 Encounter for preprocedural laboratory examination: Secondary | ICD-10-CM | POA: Insufficient documentation

## 2015-09-03 DIAGNOSIS — Z79899 Other long term (current) drug therapy: Secondary | ICD-10-CM | POA: Insufficient documentation

## 2015-09-03 DIAGNOSIS — K219 Gastro-esophageal reflux disease without esophagitis: Secondary | ICD-10-CM | POA: Insufficient documentation

## 2015-09-03 DIAGNOSIS — M879 Osteonecrosis, unspecified: Secondary | ICD-10-CM | POA: Insufficient documentation

## 2015-09-03 LAB — CBC
HCT: 40.7 % (ref 36.0–46.0)
Hemoglobin: 13.3 g/dL (ref 12.0–15.0)
MCH: 30.9 pg (ref 26.0–34.0)
MCHC: 32.7 g/dL (ref 30.0–36.0)
MCV: 94.4 fL (ref 78.0–100.0)
Platelets: 253 10*3/uL (ref 150–400)
RBC: 4.31 MIL/uL (ref 3.87–5.11)
RDW: 12.7 % (ref 11.5–15.5)
WBC: 6.1 10*3/uL (ref 4.0–10.5)

## 2015-09-03 LAB — SURGICAL PCR SCREEN
MRSA, PCR: NEGATIVE
Staphylococcus aureus: NEGATIVE

## 2015-09-03 LAB — BASIC METABOLIC PANEL
Anion gap: 10 (ref 5–15)
BUN: 11 mg/dL (ref 6–20)
CO2: 27 mmol/L (ref 22–32)
Calcium: 9.5 mg/dL (ref 8.9–10.3)
Chloride: 103 mmol/L (ref 101–111)
Creatinine, Ser: 0.93 mg/dL (ref 0.44–1.00)
GFR calc Af Amer: >60 mL/min (ref >60)
GFR calc non Af Amer: >60 mL/min (ref >60)
Glucose, Bld: 101 mg/dL — ABNORMAL HIGH (ref 65–99)
Potassium: 3.9 mmol/L (ref 3.5–5.1)
Sodium: 140 mmol/L (ref 135–145)

## 2015-09-03 NOTE — Pre-Procedure Instructions (Addendum)
    Mattia Wessner  09/03/2015      [id number] - [place], [place] [address] [place] [address] Phone: [phone] Fax: [phone]  [facility], [place], [place] [address] Phone: [phone] Fax: [phone]    Your procedure is scheduled on Wednesday April 5th  Report to [facility] Admitting at 6:30 am  Call this number if you have problems the morning of surgery:  [phone]   Remember:  Do not eat food or drink liquids after midnight.   Take these medicines the morning of surgery with A SIP OF WATER: Wellbutrin, eye drops, Prozac, pain pill if needed, Omeprazole (Prilosec)  Stop all vitamin and herbal supplements, probiotic, Aspirin or aspirin containing products, anti-inflamitories (Nsaids) such as Aleve, Motrin, Ibuprofen, Advil   Do not wear jewelry, make-up or nail polish.  Do not wear lotions, powders, or perfumes.  You may not wear deodorant.  Do not shave 48 hours prior to surgery.    Do not bring valuables to the hospital.  [facility] is not responsible for any belongings or valuables.  Contacts, dentures or bridgework may not be worn into surgery.  Leave your suitcase in the car.  After surgery it may be brought to your room.  For patients admitted to the hospital, discharge time will be determined by your treatment team.  Special instructions:  Shower with CHG as instructed  Please read over the following fact sheets that you were given. Pain Booklet, Coughing and Deep Breathing, MRSA Information and Surgical Site Infection Prevention

## 2015-09-03 NOTE — Progress Notes (Addendum)
Cardiologist Dr Irish Lack.  Last seen in August  PCP Dr Collie [facility].  Last seen 2 weeks ago for pre-op appointment.  Will request records.  Stress test 2016? Pt states should be available at Dr. Thompson Caul office.  Will request Pt has never had Echo or heart cath.  EKG last done at PCP office 2 weeks ago.  Will request.  Most recent in EPIC from 04/24/15  Had sleep study done 07/16/15 as was falling asleep while driving.  States was borderline and has not been fit with Cpap yet, but has also not had issues recently as her life has been less stressful and she is getting better sleep currently.  Pt also states was found to have a diaphragm that is higher on one side of her body.  Was having an uncontrolled cough, but after further evaluation was diagnosed with reflux which has controlled her symptoms.  No current cough or cold.  Will obtain heart records and have anesthesia PA review

## 2015-09-03 NOTE — Progress Notes (Signed)
Anesthesia Chart Review:  Pt is a 65 year old female scheduled for L4-5, L5-S1 lumbar decompression on 09/09/2015 with Dr. Rolena Infante.   Cardiologist is Dr. Irish Lack, last office visit 04/24/15 with Cecilie Kicks, NP.   PMH includes:  Hyperlipidemia, avascular necrosis (s/p R BKA), GERD, post-op N/V. Former smoker. BMI 32. S/p vulvectomy 01/28/15.   Medications include: hctz, potassium, vyvanse, prilosec, simvastatin  Preoperative labs reviewed.    EKG 04/24/15: NSR.   Exercise stress test 07/10/14:  -Denies any CP during entire test. - Functional capacity above average esp consider pt has R BKA wear prosthesis - there are no ST or T wave changes to suggest ischemia  If no changes, I anticipate pt can proceed with surgery as scheduled.   Willeen Cass, FNP-BC [facility]/Anesthesiology Phone: [phone] 09/03/2015 3:41 PM

## 2015-09-08 MED ORDER — CEFAZOLIN SODIUM-DEXTROSE 2-4 GM/100ML-% IV SOLN
2.0000 g | INTRAVENOUS | Status: AC
  Administered 2015-09-09: 2 g via INTRAVENOUS
  Filled 2015-09-08: qty 100

## 2015-09-08 NOTE — Anesthesia Preprocedure Evaluation (Addendum)
Anesthesia Evaluation  Patient identified by MRN, date of birth, ID band Patient awake    History of Anesthesia Complications (+) PONV and history of anesthetic complications  Airway Mallampati: II  TM Distance: >3 FB Neck ROM: Full    Dental  (+) Edentulous Upper, Dental Advisory Given   Pulmonary asthma , former smoker,  Left diaphragm higher than the other.   breath sounds clear to auscultation       Cardiovascular negative cardio ROS   Rhythm:Regular Rate:Normal     Neuro/Psych PSYCHIATRIC DISORDERS Anxiety Depression negative neurological ROS     GI/Hepatic Neg liver ROS, GERD  Medicated and Controlled,  Endo/Other  Morbid obesity  Renal/GU negative Renal ROS  negative genitourinary   Musculoskeletal negative musculoskeletal ROS (+)   Abdominal   Peds negative pediatric ROS (+)  Hematology negative hematology ROS (+)   Anesthesia Other Findings - HLD   Reproductive/Obstetrics negative OB ROS                           Lab Results  Component Value Date   WBC 6.1 09/03/2015   HGB 13.3 09/03/2015   HCT 40.7 09/03/2015   MCV 94.4 09/03/2015   PLT 253 09/03/2015   Lab Results  Component Value Date   CREATININE 0.93 09/03/2015   BUN 11 09/03/2015   NA 140 09/03/2015   K 3.9 09/03/2015   CL 103 09/03/2015   CO2 27 09/03/2015   No results found for: INR, PROTIME  04/2015 EKG: normal sinus rhythm.   Anesthesia Physical Anesthesia Plan  ASA: III  Anesthesia Plan: General   Post-op Pain Management:    Induction: Intravenous  Airway Management Planned: Oral ETT  Additional Equipment:   Intra-op Plan:   Post-operative Plan: Extubation in OR  Informed Consent: I have reviewed the patients History and Physical, chart, labs and discussed the procedure including the risks, benefits and alternatives for the proposed anesthesia with the patient or authorized  representative who has indicated his/her understanding and acceptance.   Dental advisory given  Plan Discussed with: CRNA  Anesthesia Plan Comments:         Anesthesia Quick Evaluation

## 2015-09-09 ENCOUNTER — Encounter (HOSPITAL_COMMUNITY): Payer: Self-pay | Admitting: Anesthesiology

## 2015-09-09 ENCOUNTER — Inpatient Hospital Stay (HOSPITAL_COMMUNITY): Payer: 59

## 2015-09-09 ENCOUNTER — Inpatient Hospital Stay (HOSPITAL_COMMUNITY)
Admission: RE | Admit: 2015-09-09 | Discharge: 2015-09-10 | DRG: 520 | Disposition: A | Discharge status: Home Health | Payer: 59 | Source: Ambulatory Visit | Attending: Orthopedic Surgery | Admitting: Orthopedic Surgery

## 2015-09-09 ENCOUNTER — Inpatient Hospital Stay (HOSPITAL_COMMUNITY): Payer: 59 | Admitting: Anesthesiology

## 2015-09-09 ENCOUNTER — Inpatient Hospital Stay (HOSPITAL_COMMUNITY): Payer: 59 | Admitting: Emergency Medicine

## 2015-09-09 ENCOUNTER — Encounter (HOSPITAL_COMMUNITY)
Admission: RE | Disposition: A | Discharge status: Home Health | Payer: Self-pay | Source: Ambulatory Visit | Attending: Orthopedic Surgery

## 2015-09-09 DIAGNOSIS — Z87891 Personal history of nicotine dependence: Secondary | ICD-10-CM

## 2015-09-09 DIAGNOSIS — Z89611 Acquired absence of right leg above knee: Secondary | ICD-10-CM | POA: Diagnosis not present

## 2015-09-09 DIAGNOSIS — M5442 Lumbago with sciatica, left side: Secondary | ICD-10-CM | POA: Diagnosis present

## 2015-09-09 DIAGNOSIS — M549 Dorsalgia, unspecified: Secondary | ICD-10-CM | POA: Diagnosis present

## 2015-09-09 DIAGNOSIS — F329 Major depressive disorder, single episode, unspecified: Secondary | ICD-10-CM | POA: Diagnosis present

## 2015-09-09 DIAGNOSIS — E785 Hyperlipidemia, unspecified: Secondary | ICD-10-CM | POA: Diagnosis present

## 2015-09-09 DIAGNOSIS — F419 Anxiety disorder, unspecified: Secondary | ICD-10-CM | POA: Diagnosis present

## 2015-09-09 DIAGNOSIS — Z79899 Other long term (current) drug therapy: Secondary | ICD-10-CM

## 2015-09-09 DIAGNOSIS — M5136 Other intervertebral disc degeneration, lumbar region: Secondary | ICD-10-CM | POA: Diagnosis present

## 2015-09-09 DIAGNOSIS — M5186 Other intervertebral disc disorders, lumbar region: Secondary | ICD-10-CM | POA: Diagnosis not present

## 2015-09-09 DIAGNOSIS — M5126 Other intervertebral disc displacement, lumbar region: Secondary | ICD-10-CM | POA: Diagnosis present

## 2015-09-09 DIAGNOSIS — M79605 Pain in left leg: Secondary | ICD-10-CM | POA: Diagnosis present

## 2015-09-09 DIAGNOSIS — Z419 Encounter for procedure for purposes other than remedying health state, unspecified: Secondary | ICD-10-CM

## 2015-09-09 HISTORY — PX: LUMBAR LAMINECTOMY/DECOMPRESSION MICRODISCECTOMY: SHX5026

## 2015-09-09 SURGERY — LUMBAR LAMINECTOMY/DECOMPRESSION MICRODISCECTOMY 2 LEVELS
Anesthesia: General | Site: Spine Lumbar

## 2015-09-09 MED ORDER — CEFAZOLIN SODIUM 1-5 GM-% IV SOLN
1.0000 g | Freq: Three times a day (TID) | INTRAVENOUS | Status: AC
  Administered 2015-09-09 – 2015-09-10 (×2): 1 g via INTRAVENOUS
  Filled 2015-09-09 (×2): qty 50

## 2015-09-09 MED ORDER — LIDOCAINE HCL (CARDIAC) 20 MG/ML IV SOLN
INTRAVENOUS | Status: DC | PRN
  Administered 2015-09-09: 60 mg via INTRAVENOUS

## 2015-09-09 MED ORDER — 0.9 % SODIUM CHLORIDE (POUR BTL) OPTIME
TOPICAL | Status: DC | PRN
  Administered 2015-09-09: 1000 mL

## 2015-09-09 MED ORDER — EPHEDRINE SULFATE 50 MG/ML IJ SOLN
INTRAMUSCULAR | Status: AC
  Filled 2015-09-09: qty 1

## 2015-09-09 MED ORDER — LISDEXAMFETAMINE DIMESYLATE 70 MG PO CAPS
70.0000 mg | ORAL_CAPSULE | Freq: Every day | ORAL | Status: DC
  Administered 2015-09-10: 70 mg via ORAL
  Filled 2015-09-09 (×2): qty 1

## 2015-09-09 MED ORDER — BUPIVACAINE-EPINEPHRINE (PF) 0.25% -1:200000 IJ SOLN
INTRAMUSCULAR | Status: DC | PRN
  Administered 2015-09-09: 10 mL via PERINEURAL

## 2015-09-09 MED ORDER — THROMBIN 20000 UNITS EX SOLR
CUTANEOUS | Status: AC
Start: 2015-09-09 — End: 2015-09-09
  Filled 2015-09-09: qty 20000

## 2015-09-09 MED ORDER — CYCLOSPORINE 0.05 % OP EMUL
1.0000 [drp] | Freq: Two times a day (BID) | OPHTHALMIC | Status: DC
  Administered 2015-09-09 – 2015-09-10 (×2): 1 [drp] via OPHTHALMIC
  Filled 2015-09-09 (×4): qty 1

## 2015-09-09 MED ORDER — MONTELUKAST SODIUM 10 MG PO TABS
10.0000 mg | ORAL_TABLET | Freq: Every day | ORAL | Status: DC
  Administered 2015-09-09: 10 mg via ORAL
  Filled 2015-09-09 (×2): qty 1

## 2015-09-09 MED ORDER — PANTOPRAZOLE SODIUM 40 MG PO TBEC
40.0000 mg | DELAYED_RELEASE_TABLET | Freq: Every day | ORAL | Status: DC
  Administered 2015-09-09 – 2015-09-10 (×2): 40 mg via ORAL
  Filled 2015-09-09 (×2): qty 1

## 2015-09-09 MED ORDER — GLYCOPYRROLATE 0.2 MG/ML IJ SOLN
INTRAMUSCULAR | Status: AC
  Filled 2015-09-09: qty 1

## 2015-09-09 MED ORDER — PROPOFOL 10 MG/ML IV BOLUS
INTRAVENOUS | Status: AC
Start: 2015-09-09 — End: 2015-09-09
  Filled 2015-09-09: qty 20

## 2015-09-09 MED ORDER — ROCURONIUM BROMIDE 50 MG/5ML IV SOLN
INTRAVENOUS | Status: AC
  Filled 2015-09-09: qty 1

## 2015-09-09 MED ORDER — STERILE WATER FOR INJECTION IJ SOLN
INTRAMUSCULAR | Status: AC
  Filled 2015-09-09: qty 10

## 2015-09-09 MED ORDER — THROMBIN 20000 UNITS EX SOLR
OROMUCOSAL | Status: DC | PRN
  Administered 2015-09-09: 20 mL via TOPICAL

## 2015-09-09 MED ORDER — MENTHOL 3 MG MT LOZG: 1.0000 | LOZENGE | OROMUCOSAL | Status: DC | PRN

## 2015-09-09 MED ORDER — MEPERIDINE HCL 25 MG/ML IJ SOLN: 6.2500 mg | INTRAMUSCULAR | Status: DC | PRN

## 2015-09-09 MED ORDER — FENTANYL CITRATE (PF) 250 MCG/5ML IJ SOLN
INTRAMUSCULAR | Status: AC
  Filled 2015-09-09: qty 5

## 2015-09-09 MED ORDER — SUCCINYLCHOLINE CHLORIDE 20 MG/ML IJ SOLN
INTRAMUSCULAR | Status: AC
  Filled 2015-09-09: qty 1

## 2015-09-09 MED ORDER — LACTATED RINGERS IV SOLN
INTRAVENOUS | Status: DC | PRN
  Administered 2015-09-09 (×2): via INTRAVENOUS

## 2015-09-09 MED ORDER — FLUOXETINE HCL 20 MG PO CAPS
20.0000 mg | ORAL_CAPSULE | Freq: Every day | ORAL | Status: DC
  Administered 2015-09-10: 20 mg via ORAL
  Filled 2015-09-09: qty 1

## 2015-09-09 MED ORDER — FLEET ENEMA 7-19 GM/118ML RE ENEM: 1.0000 | ENEMA | Freq: Once | RECTAL | Status: DC

## 2015-09-09 MED ORDER — DEXAMETHASONE SODIUM PHOSPHATE 10 MG/ML IJ SOLN
INTRAMUSCULAR | Status: AC
Start: 2015-09-09 — End: 2015-09-09
  Filled 2015-09-09: qty 1

## 2015-09-09 MED ORDER — LIDOCAINE HCL (CARDIAC) 20 MG/ML IV SOLN
INTRAVENOUS | Status: AC
  Filled 2015-09-09: qty 5

## 2015-09-09 MED ORDER — HEMOSTATIC AGENTS (NO CHARGE) OPTIME
TOPICAL | Status: DC | PRN
  Administered 2015-09-09: 1 via TOPICAL

## 2015-09-09 MED ORDER — SODIUM CHLORIDE 0.9% FLUSH: 3.0000 mL | INTRAVENOUS | Status: DC | PRN

## 2015-09-09 MED ORDER — ONDANSETRON HCL 4 MG/2ML IJ SOLN
4.0000 mg | INTRAMUSCULAR | Status: DC | PRN
Start: 2015-09-09 — End: 2015-09-10

## 2015-09-09 MED ORDER — HYDROMORPHONE HCL 1 MG/ML IJ SOLN: 0.5000 mg | INTRAMUSCULAR | Status: DC | PRN

## 2015-09-09 MED ORDER — ONDANSETRON HCL 4 MG PO TABS: 4.0000 mg | ORAL_TABLET | Freq: Three times a day (TID) | ORAL | Status: DC | PRN

## 2015-09-09 MED ORDER — FENTANYL CITRATE (PF) 100 MCG/2ML IJ SOLN
INTRAMUSCULAR | Status: DC | PRN
  Administered 2015-09-09 (×5): 50 ug via INTRAVENOUS

## 2015-09-09 MED ORDER — ROCURONIUM BROMIDE 100 MG/10ML IV SOLN
INTRAVENOUS | Status: DC | PRN
  Administered 2015-09-09: 10 mg via INTRAVENOUS
  Administered 2015-09-09: 40 mg via INTRAVENOUS

## 2015-09-09 MED ORDER — SODIUM CHLORIDE 0.9 % IJ SOLN
INTRAMUSCULAR | Status: AC
  Filled 2015-09-09: qty 10

## 2015-09-09 MED ORDER — BUPIVACAINE-EPINEPHRINE (PF) 0.25% -1:200000 IJ SOLN
INTRAMUSCULAR | Status: AC
  Filled 2015-09-09: qty 30

## 2015-09-09 MED ORDER — MAGNESIUM CITRATE PO SOLN
0.5000 | Freq: Once | ORAL | Status: AC
  Administered 2015-09-10: 0.5 via ORAL
  Filled 2015-09-09: qty 296

## 2015-09-09 MED ORDER — PHENOL 1.4 % MT LIQD
1.0000 | OROMUCOSAL | Status: DC | PRN
Start: 2015-09-09 — End: 2015-09-10

## 2015-09-09 MED ORDER — PROPOFOL 10 MG/ML IV BOLUS
INTRAVENOUS | Status: DC | PRN
  Administered 2015-09-09: 150 mg via INTRAVENOUS

## 2015-09-09 MED ORDER — OXYCODONE-ACETAMINOPHEN 10-325 MG PO TABS: 1.0000 | ORAL_TABLET | ORAL | Status: DC | PRN

## 2015-09-09 MED ORDER — PHENYLEPHRINE HCL 10 MG/ML IJ SOLN
10.0000 mg | INTRAVENOUS | Status: DC | PRN
  Administered 2015-09-09: 20 ug/min via INTRAVENOUS

## 2015-09-09 MED ORDER — LACTATED RINGERS IV SOLN: INTRAVENOUS | Status: DC

## 2015-09-09 MED ORDER — SUGAMMADEX SODIUM 200 MG/2ML IV SOLN
INTRAVENOUS | Status: DC | PRN
  Administered 2015-09-09: 180 mg via INTRAVENOUS

## 2015-09-09 MED ORDER — MIDAZOLAM HCL 2 MG/2ML IJ SOLN
INTRAMUSCULAR | Status: AC
  Filled 2015-09-09: qty 2

## 2015-09-09 MED ORDER — OXYCODONE HCL 5 MG PO TABS
10.0000 mg | ORAL_TABLET | ORAL | Status: DC | PRN
  Administered 2015-09-09 – 2015-09-10 (×6): 10 mg via ORAL
  Filled 2015-09-09 (×5): qty 2

## 2015-09-09 MED ORDER — SUGAMMADEX SODIUM 200 MG/2ML IV SOLN
INTRAVENOUS | Status: AC
  Filled 2015-09-09: qty 2

## 2015-09-09 MED ORDER — HYDROCHLOROTHIAZIDE 25 MG PO TABS
25.0000 mg | ORAL_TABLET | Freq: Every day | ORAL | Status: DC
  Administered 2015-09-09 – 2015-09-10 (×2): 25 mg via ORAL
  Filled 2015-09-09 (×2): qty 1

## 2015-09-09 MED ORDER — DEXAMETHASONE 4 MG PO TABS
4.0000 mg | ORAL_TABLET | Freq: Four times a day (QID) | ORAL | Status: AC
  Administered 2015-09-09 – 2015-09-10 (×3): 4 mg via ORAL
  Filled 2015-09-09 (×3): qty 1

## 2015-09-09 MED ORDER — HYDROMORPHONE HCL 1 MG/ML IJ SOLN
0.2500 mg | INTRAMUSCULAR | Status: DC | PRN
  Administered 2015-09-09: 0.5 mg via INTRAVENOUS

## 2015-09-09 MED ORDER — PHENYLEPHRINE 40 MCG/ML (10ML) SYRINGE FOR IV PUSH (FOR BLOOD PRESSURE SUPPORT)
PREFILLED_SYRINGE | INTRAVENOUS | Status: AC
  Filled 2015-09-09: qty 10

## 2015-09-09 MED ORDER — VECURONIUM BROMIDE 10 MG IV SOLR
INTRAVENOUS | Status: DC | PRN
  Administered 2015-09-09: 2 mg via INTRAVENOUS
  Administered 2015-09-09: 1 mg via INTRAVENOUS

## 2015-09-09 MED ORDER — BUPROPION HCL ER (XL) 300 MG PO TB24
300.0000 mg | ORAL_TABLET | Freq: Every day | ORAL | Status: DC
  Administered 2015-09-10: 300 mg via ORAL
  Filled 2015-09-09: qty 1

## 2015-09-09 MED ORDER — OXYCODONE HCL 5 MG PO TABS
ORAL_TABLET | ORAL | Status: AC
  Administered 2015-09-09: 10 mg via ORAL
  Filled 2015-09-09: qty 2

## 2015-09-09 MED ORDER — VECURONIUM BROMIDE 10 MG IV SOLR
INTRAVENOUS | Status: AC
  Filled 2015-09-09: qty 10

## 2015-09-09 MED ORDER — HYDROMORPHONE HCL 1 MG/ML IJ SOLN
INTRAMUSCULAR | Status: AC
  Administered 2015-09-09: 0.5 mg via INTRAVENOUS
  Filled 2015-09-09: qty 1

## 2015-09-09 MED ORDER — SCOPOLAMINE 1 MG/3DAYS TD PT72
MEDICATED_PATCH | TRANSDERMAL | Status: AC
  Administered 2015-09-09: 1 via TRANSDERMAL
  Filled 2015-09-09: qty 1

## 2015-09-09 MED ORDER — DEXAMETHASONE SODIUM PHOSPHATE 10 MG/ML IJ SOLN
INTRAMUSCULAR | Status: DC | PRN
  Administered 2015-09-09: 10 mg via INTRAVENOUS

## 2015-09-09 MED ORDER — ONDANSETRON HCL 4 MG/2ML IJ SOLN
INTRAMUSCULAR | Status: DC | PRN
  Administered 2015-09-09: 4 mg via INTRAVENOUS

## 2015-09-09 MED ORDER — ACETAMINOPHEN 10 MG/ML IV SOLN
INTRAVENOUS | Status: AC
  Administered 2015-09-09: 1000 mg via INTRAVENOUS
  Filled 2015-09-09: qty 100

## 2015-09-09 MED ORDER — METHOCARBAMOL 1000 MG/10ML IJ SOLN: 500.0000 mg | Freq: Four times a day (QID) | INTRAVENOUS | Status: DC | PRN

## 2015-09-09 MED ORDER — SODIUM CHLORIDE 0.9% FLUSH: 3.0000 mL | Freq: Two times a day (BID) | INTRAVENOUS | Status: DC

## 2015-09-09 MED ORDER — DEXAMETHASONE SODIUM PHOSPHATE 4 MG/ML IJ SOLN: 4.0000 mg | Freq: Four times a day (QID) | INTRAMUSCULAR | Status: AC

## 2015-09-09 MED ORDER — PHENYLEPHRINE HCL 10 MG/ML IJ SOLN
INTRAMUSCULAR | Status: DC | PRN
  Administered 2015-09-09: 40 ug via INTRAVENOUS
  Administered 2015-09-09: 80 ug via INTRAVENOUS
  Administered 2015-09-09 (×5): 40 ug via INTRAVENOUS
  Administered 2015-09-09: 80 ug via INTRAVENOUS

## 2015-09-09 MED ORDER — METHOCARBAMOL 500 MG PO TABS
500.0000 mg | ORAL_TABLET | Freq: Four times a day (QID) | ORAL | Status: DC | PRN
  Administered 2015-09-09 – 2015-09-10 (×2): 500 mg via ORAL
  Filled 2015-09-09 (×2): qty 1

## 2015-09-09 MED ORDER — METHOCARBAMOL 500 MG PO TABS: 500.0000 mg | ORAL_TABLET | Freq: Three times a day (TID) | ORAL | Status: DC | PRN

## 2015-09-09 MED ORDER — MIDAZOLAM HCL 5 MG/5ML IJ SOLN
INTRAMUSCULAR | Status: DC | PRN
  Administered 2015-09-09: 2 mg via INTRAVENOUS

## 2015-09-09 MED ORDER — ONDANSETRON HCL 4 MG/2ML IJ SOLN
INTRAMUSCULAR | Status: AC
  Filled 2015-09-09: qty 2

## 2015-09-09 SURGICAL SUPPLY — 64 items
15F BLAKE DRAIN ×2 IMPLANT
BNDG GAUZE ELAST 4 BULKY (GAUZE/BANDAGES/DRESSINGS) IMPLANT
BUR EGG ELITE 4.0 (BURR) IMPLANT
CLSR STERI-STRIP ANTIMIC 1/2X4 (GAUZE/BANDAGES/DRESSINGS) ×2 IMPLANT
CORDS BIPOLAR (ELECTRODE) ×2 IMPLANT
DRAPE C-ARM 42X72 X-RAY (DRAPES) IMPLANT
DRAPE POUCH INSTRU U-SHP 10X18 (DRAPES) ×2 IMPLANT
DRAPE SURG 17X11 SM STRL (DRAPES) ×2 IMPLANT
DRAPE U-SHAPE 47X51 STRL (DRAPES) ×2 IMPLANT
DRSG AQUACEL AG ADV 3.5X 6 (GAUZE/BANDAGES/DRESSINGS) ×2 IMPLANT
DRSG MEPILEX BORDER 4X4 (GAUZE/BANDAGES/DRESSINGS) ×2 IMPLANT
DURAPREP 26ML APPLICATOR (WOUND CARE) ×2 IMPLANT
ELECT BLADE 4.0 EZ CLEAN MEGAD (MISCELLANEOUS) ×2
ELECT CAUTERY BLADE 6.4 (BLADE) ×2 IMPLANT
ELECT PENCIL ROCKER SW 15FT (MISCELLANEOUS) ×2 IMPLANT
ELECT REM PT RETURN 9FT ADLT (ELECTROSURGICAL) ×2
ELECTRODE BLDE 4.0 EZ CLN MEGD (MISCELLANEOUS) ×1 IMPLANT
ELECTRODE REM PT RTRN 9FT ADLT (ELECTROSURGICAL) ×1 IMPLANT
EVACUATOR SILICONE 100CC (DRAIN) ×2 IMPLANT
FLOSEAL (HEMOSTASIS) IMPLANT
GLOVE BIO SURGEON STRL SZ 6.5 (GLOVE) ×2 IMPLANT
GLOVE BIOGEL PI IND STRL 6.5 (GLOVE) ×1 IMPLANT
GLOVE BIOGEL PI IND STRL 8.5 (GLOVE) ×1 IMPLANT
GLOVE BIOGEL PI INDICATOR 6.5 (GLOVE) ×1
GLOVE BIOGEL PI INDICATOR 8.5 (GLOVE) ×1
GLOVE SS BIOGEL STRL SZ 8.5 (GLOVE) ×1 IMPLANT
GLOVE SUPERSENSE BIOGEL SZ 8.5 (GLOVE) ×1
GOWN STRL REUS W/ TWL LRG LVL3 (GOWN DISPOSABLE) ×1 IMPLANT
GOWN STRL REUS W/TWL 2XL LVL3 (GOWN DISPOSABLE) ×4 IMPLANT
GOWN STRL REUS W/TWL LRG LVL3 (GOWN DISPOSABLE) ×1
KIT BASIN OR (CUSTOM PROCEDURE TRAY) ×2 IMPLANT
NEEDLE 22X1 1/2 (OR ONLY) (NEEDLE) ×2 IMPLANT
NEEDLE SPNL 18GX3.5 QUINCKE PK (NEEDLE) ×4 IMPLANT
NS IRRIG 1000ML POUR BTL (IV SOLUTION) ×2 IMPLANT
PACK LAMINECTOMY ORTHO (CUSTOM PROCEDURE TRAY) ×2 IMPLANT
PACK UNIVERSAL I (CUSTOM PROCEDURE TRAY) ×2 IMPLANT
PATTIES SURGICAL .5 X.5 (GAUZE/BANDAGES/DRESSINGS) IMPLANT
PATTIES SURGICAL .5 X1 (DISPOSABLE) ×4 IMPLANT
SPONGE LAP 4X18 X RAY DECT (DISPOSABLE) ×2 IMPLANT
SPONGE SURGIFOAM ABS GEL 100 (HEMOSTASIS) ×2 IMPLANT
STAPLER VISISTAT 35W (STAPLE) ×2 IMPLANT
STRIP CLOSURE SKIN 1/2X4 (GAUZE/BANDAGES/DRESSINGS) IMPLANT
SURGIFLO W/THROMBIN 8M KIT (HEMOSTASIS) ×2 IMPLANT
SUT BONE WAX W31G (SUTURE) ×2 IMPLANT
SUT ETHILON 2 0 FS 18 (SUTURE) ×2 IMPLANT
SUT MON AB 3-0 SH 27 (SUTURE)
SUT MON AB 3-0 SH27 (SUTURE) IMPLANT
SUT STRATAFIX 1PDS 45CM VIOLET (SUTURE) ×2 IMPLANT
SUT STRATAFIX MNCRL+ 3-0 PS-2 (SUTURE) ×1
SUT STRATAFIX MONOCRYL 3-0 (SUTURE) ×1
SUT STRATAFIX SPIRAL + 2-0 (SUTURE) ×2 IMPLANT
SUT VIC AB 1 CT1 18XCR BRD 8 (SUTURE) IMPLANT
SUT VIC AB 1 CT1 27 (SUTURE)
SUT VIC AB 1 CT1 27XBRD ANTBC (SUTURE) IMPLANT
SUT VIC AB 1 CT1 8-18 (SUTURE)
SUT VIC AB 2-0 CT1 18 (SUTURE) IMPLANT
SUT VICRYL 0 UR6 27IN ABS (SUTURE) ×2 IMPLANT
SUTURE STRATFX MNCRL+ 3-0 PS-2 (SUTURE) ×1 IMPLANT
SYR BULB IRRIGATION 50ML (SYRINGE) ×2 IMPLANT
SYR CONTROL 10ML LL (SYRINGE) ×2 IMPLANT
TOWEL OR 17X26 10 PK STRL BLUE (TOWEL DISPOSABLE) ×4 IMPLANT
TRAY FOLEY CATH 16FRSI W/METER (SET/KITS/TRAYS/PACK) ×2 IMPLANT
WATER STERILE IRR 1000ML POUR (IV SOLUTION) IMPLANT
YANKAUER SUCT BULB TIP NO VENT (SUCTIONS) ×2 IMPLANT

## 2015-09-09 NOTE — H&P (Signed)
History of Present Illness The patient is a 65 year old female who presents for  back pain. They are now 2 week(s) out from Lumbar ESI 08-04-15 ((DOI 06-04-15)). Symptoms reported today include: pain (back and both legs), pain at night, aching, stiffness, locking, catching, popping, grinding, giving way, pain with weightbearing, difficulty ambulating, difficulty arising from chair, pain with overhead motions, leg pain, pain with lying, pain with lifting and pain with standing, while the patient does not report symptoms of: swelling, throbbing, instability, numbness, burning, urinary incontinence or pain with sitting. The patient states that they are doing poorly. Current treatment includes: physical therapy (@ [facility]) and pain medications. The following medication has been used for pain control: Hydrocodone. The patient reports their current pain level to be 8 / 10. The patient states ESI helped for a couple of days and physical therapy.  Problem List/Past Medical  Problems Reconciled  Arthritis, midfoot (M19.079)  Quadriceps weakness (M62.81)  Pes planus, left  Chondromalacia patellae, left knee (M22.42)  Ankle impingement syndrome, left (M77.52)  Posterior right knee pain (M25.561)  Stiff neck (M43.6)  Closed fracture of lateral portion of right tibial plateau, with routine healing, subsequent encounter (S82.121D)  Synovial cyst of popliteal space [Baker], right knee (M71.21)  Chronic pain of left ankle (M25.572)  Other cervical disc degeneration at C5-C6 level (M50.322)  Primary osteoarthritis of left knee (M17.12)  Lumbar strain, initial encounter [id number]  Antalgic gait (R26.89)  Lumbar HNP (M51.26)  DDD (degenerative disc disease), lumbosacral (M51.37)  Knee internal derangement, right (M23.91)  Neck pain, bilateral posterior (M54.2)  Radicular pain of left lower extremity (M54.10)  Left cervical radiculopathy (M54.12)  Injury of right knee, subsequent encounter  (S89.91XD)  Knee stiffness, left (M25.662)  Acute left-sided low back pain with left-sided sciatica (M54.42)   Allergies  No Known Drug Allergies 04/05/2013 Allergies Reconciled   Social History Tobacco use  Former smoker. 04/05/2013 Tobacco / smoke exposure  04/05/2013: no Exercise  Exercises weekly; does running / walking Current drinker  04/05/2013: Currently drinks beer and wine Marital status  divorced  Medication History  Norco (5-325MG  Tablet, 1 (one) Oral every 8 hours as needed for pain, Taken starting 07/28/2015) Inactive. Vyvanse (70MG  Capsule, Oral) Active. (qd) PROzac (20MG  Capsule, 1 Oral daily) Active. (qd) Calcium Caltrate w/ D Active. B-1 250mg  Active. Wellbutrin XL300  Active. (qd) Zocor 20mg  Active. (qd) Centrum Silver Active. (qd) Folic Acid [id number] Active. (qd) B-12 2585mcg Active. (qd) Super B Complex  Active. (qd) Magnesium 500mg  Active. (qd) L-Lysine 100mg  Active. (qd) Cascara [place] Active. (qd) Chlorpheniramine Maleate (4MG  Tablet, Oral) Active. (#2 qhs) Prolia (60MG /ML Solution, Subcutaneous) Active. (Injection q 6 months) Singulair (10MG  Tablet, Oral) Active. (qd) Probiotic-Prebiotic (1-250BILLION-MG Capsule, Oral) Active. (10 billion qd) Klor-Con M10 (10MEQ Tablet ER, Oral) Active. (qd) HydroCHLOROthiazide (25MG  Tablet, Oral) Active. (qd) Medications Reconciled  Other Problems Hypercholesterolemia   Physical Exam  General General Appearance-Not in acute distress. Orientation-Oriented X3. Build & Nutrition-Well nourished and Well developed.  Integumentary General Characteristics Surgical Scars - no surgical scar evidence of previous lumbar surgery. Lumbar Spine-Skin examination of the lumbar spine is without deformity, skin lesions, lacerations or abrasions.  Abdomen Palpation/Percussion Palpation and Percussion of the abdomen reveal - Soft, Non Tender and No Rebound tenderness.  Peripheral  Vascular Lower Extremity Palpation - Posterior tibial pulse - Bilateral - 2+. Dorsalis pedis pulse - Bilateral - 2+.  Neurologic Sensation Lower Extremity - Right - sensation is diminished in the lower extremity. Note: pt has AKA RLE.  Reflexes Patellar Reflex - Bilateral - 2+. Achilles Reflex - Bilateral - 2+. Clonus - Bilateral - clonus not present. Hoffman's Sign - Bilateral - Hoffman's sign not present. Testing Seated Straight Leg Raise - Right - Seated straight leg raise positive.  Musculoskeletal Spine/Ribs/Pelvis  Lumbosacral Spine: Inspection and Palpation - Tenderness - left lumbar paraspinals tender to palpation and right lumbar paraspinals tender to palpation. Strength and Tone: Strength - Hip Flexion - Bilateral - 5/5. Knee Extension - Bilateral - 5/5. Knee Flexion - Bilateral - 5/5. Ankle Dorsiflexion - Left - 5/5. Right - 0/5. Note: AKA. Ankle Plantarflexion - Left - 5/5. Right - 0/5. Note: AKA. Heel walk - Bilateral - unable to heel walk. Toe Walk - Bilateral - unable to walk on toes. Heel-Toe Walk - Bilateral - able to heel-toe walk with moderate difficulty. ROM - Flexion - moderately decreased range of motion. Extension - moderately decreased range of motion and painful. Left Lateral Bending - moderately decreased range of motion and painful. Right Lateral Bending - full range of motion and painful. Right Rotation - mildly decreased range of motion and painful. Left Rotation - mildly decreased range of motion and painful. Pain - . Lumbosacral Spine - Waddell's Signs - no Waddell's signs present. Lower Extremity Range of Motion - No true hip, knee or ankle pain with range of motion. Gait and Station - [employer] - no assistive devices.    Assessment & Plan  Goal Of Surgery: Discussed that goal of surgery is to reduce pain and improve function and quality of life. Patient is aware that despite all appropriate treatment that there pain and function could be the same, worse,  or different. Posterior Lumbar Decompression/disectomy: Risks of surgery include infection, bleeding, nerve damage, death, stroke, paralysis, failure to heal, need for further surgery, ongoing or worse pain, need for further surgery, CSF leak, loss of bowel or bladder, and recurrent disc herniation or Stenosis which would necessitate need for further surgery.  Plan Dr Rolena Infante spoke with the pt about a Lumbar Decompression L4-5 and L5-S1. After discussion the pt would like to go forward with the surgery.  ADDENDUM At this point in time, the patient is having horrific back, buttock, and bilateral leg pain. Despite the conservative management, her symptoms continue to worsen and intensify. Her quality of life is suffering. At this point, based on the MRI we are going to move forward with midline lumbar decompression L4-5, L5-S1. This will allow addressing of the spinal stenosis and hard disc osteophyte as well as the acute left-sided disc herniation. We have reviewed the risks, which include infection, bleeding, nerve damage, death, stroke, paralysis, failure to heal, need for further surgery, ongoing or worse pain, and leak of spinal fluid. All of her questions were encouraged and address. She would like to do this at some point in April, which I think is reasonable. We will go ahead and get preoperative medical clearance from her primary care physician and insurance approval and move forward in April.

## 2015-09-09 NOTE — Brief Op Note (Signed)
09/09/2015  11:49 AM  PATIENT:  Shelly [name]  65 y.o. female  PRE-OPERATIVE DIAGNOSIS:  HNP L4-S1 RADICULAR LEG PAIN  POST-OPERATIVE DIAGNOSIS:  HNP L4-S1 RADICULAR LEG PAIN  PROCEDURE:  Procedure(s):  L4-S1 Decompression/ Discetomy (N/A)  SURGEON:  Surgeon(s) and Role:    * Melina Schools, MD - Primary  PHYSICIAN ASSISTANT:   ASSISTANTS: Carmen Mayo   ANESTHESIA:   general  EBL:  Total I/O In: 1700 [I.V.:1700] Out: 375 [Urine:175; Blood:200]  BLOOD ADMINISTERED:none  DRAINS: Penrose drain in the back   LOCAL MEDICATIONS USED:  MARCAINE     SPECIMEN:  No Specimen  DISPOSITION OF SPECIMEN:  N/A  COUNTS:  YES  TOURNIQUET:  * No tourniquets in log *  DICTATION: .Other Dictation: Dictation Number [id number]  PLAN OF CARE: Admit to inpatient   PATIENT DISPOSITION:  PACU - hemodynamically stable.

## 2015-09-09 NOTE — Anesthesia Postprocedure Evaluation (Signed)
Anesthesia Post Note  Patient: Shelly [name]  Procedure(s) Performed: Procedure(s) (LRB):  L4-S1 Decompression/ Discetomy (N/A)  Patient location during evaluation: PACU Anesthesia Type: General Level of consciousness: awake and alert Pain management: pain level controlled Vital Signs Assessment: post-procedure vital signs reviewed and stable Respiratory status: spontaneous breathing, nonlabored ventilation, respiratory function stable and patient connected to nasal cannula oxygen Cardiovascular status: blood pressure returned to baseline and stable Postop Assessment: no signs of nausea or vomiting Anesthetic complications: no    Last Vitals:  Filed Vitals:   09/09/15 1445 09/09/15 1513  BP:  123/65  Pulse: 89 99  Temp: 36.6 C 36.7 C  Resp: 9 16    Last Pain:  Filed Vitals:   09/09/15 1521  PainSc: 3                  Effie Berkshire

## 2015-09-09 NOTE — Discharge Instructions (Signed)
Ok to shower in 5 days. Do not remove dressing

## 2015-09-09 NOTE — Anesthesia Procedure Notes (Signed)
Procedure Name: Intubation Date/Time: 09/09/2015 8:30 AM Performed by: Jenne Campus Pre-anesthesia Checklist: Patient identified, Emergency Drugs available, Suction available, Patient being monitored and Timeout performed Patient Re-evaluated:Patient Re-evaluated prior to inductionOxygen Delivery Method: Circle system utilized Preoxygenation: Pre-oxygenation with 100% oxygen Intubation Type: IV induction Ventilation: Mask ventilation without difficulty Laryngoscope Size: Miller and 2 Grade View: Grade I Tube type: Oral Tube size: 7.0 mm Number of attempts: 1 Airway Equipment and Method: Stylet Placement Confirmation: ETT inserted through vocal cords under direct vision,  positive ETCO2,  CO2 detector and breath sounds checked- equal and bilateral Secured at: 21 cm Tube secured with: Tape Dental Injury: Teeth and Oropharynx as per pre-operative assessment  Comments: Soft bite block utilized

## 2015-09-09 NOTE — Transfer of Care (Signed)
Immediate Anesthesia Transfer of Care Note  Patient: Shelly [name]  Procedure(s) Performed: Procedure(s):  L4-S1 Decompression/ Discetomy (N/A)  Patient Location: PACU  Anesthesia Type:General  Level of Consciousness: awake, oriented and patient cooperative  Airway & Oxygen Therapy: Patient Spontanous Breathing and Patient connected to face mask oxygen  Post-op Assessment: Report given to RN and Post -op Vital signs reviewed and stable  Post vital signs: Reviewed  Last Vitals:  Filed Vitals:   09/09/15 0647 09/09/15 1220  BP: 117/65 113/78  Pulse: 86 95  Temp: 36.8 C   Resp: 18 19    Complications: No apparent anesthesia complications

## 2015-09-10 ENCOUNTER — Encounter (HOSPITAL_COMMUNITY): Payer: Self-pay | Admitting: Orthopedic Surgery

## 2015-09-10 NOTE — Progress Notes (Signed)
Pt doing well. Pt given D/C instructions with Rx's, verbal understanding was provided. Pt's incision is clean and dry with no sign of infection. Pt's IV and JP drain were removed prior to D/C. Pt D/C'd home via wheelchair @ 1930 per MD order. Pt is stable @ D/C and has no other needs at this time. Betsy Pries, RN

## 2015-09-10 NOTE — Care Management Note (Addendum)
Case Management Note  Patient Details  Name: Shelly [name] MRN: [id number] Date of Birth: 1950/11/15  Subjective/Objective:         S/p L4-S1 decompression           Action/Plan: Home health PT and OT ordered. [place] [phone] which is 3rd party that patient's insurance requires to set up home health. Gave [facility] required information and requested services to start 09/11/15 and to expedite request. Faxed order, H and P, op note, PT note and demographics to [phone]. Received confirmation. Explained to patient process with [facility] to set up [facility]. She will contact her insurance id she does not hear from a [facility] agency or [facility] 09/11/15. Patient has a tub bench and did not want to pay copay for wheelchair.She has a wheelchair that she uses upstairs in her home and has stair lift. She will look for second wheelchair on line.       Expected Discharge Date:                  Expected Discharge Plan:  [place]  In-House Referral:  NA  Discharge planning Services  CM Consult  Post Acute Care Choice:  Home Health Choice offered to:     DME Arranged:  N/A DME Agency:     HH Arranged:  PT, OT HH Agency:  Other - See comment  Status of Service:  Completed, signed off  Medicare Important Message Given:    Date Medicare IM Given:    Medicare IM give by:    Date Additional Medicare IM Given:    Additional Medicare Important Message give by:     If discussed at [place] of Stay Meetings, dates discussed:    Additional Comments:  Nila Nephew, RN 09/10/2015, 12:00 PM

## 2015-09-10 NOTE — Evaluation (Signed)
Physical Therapy Evaluation Patient Details Name: Shelly [name] MRN: [id number] DOB: [birth year] Today's Date: 09/10/2015   History of Present Illness  Pt is a 65 y/o female who presents s/p L4-S1 lumbar laminectomy/decompression on 09/09/15. PMH includes R BKA in 2013.   Clinical Impression  Pt admitted with above diagnosis. Pt currently with functional limitations due to the deficits listed below (see PT Problem List). At the time of PT eval pt was able to perform transfers and ambulation with min guard assist to supervision for safety. Pt was instructed to keep back straight but hinge from the hips when leaning over to don prosthetic, and was able to maintain precautions fairly well throughout session. Pt will benefit from skilled PT to increase their independence and safety with mobility to allow discharge to the venue listed below.       Follow Up Recommendations Home health PT    Equipment Recommendations   Tub bench, wheelchair    Recommendations for Other Services       Precautions / Restrictions Precautions Precautions: Fall Restrictions Weight Bearing Restrictions: No      Mobility  Bed Mobility Overal bed mobility: Needs Assistance Bed Mobility: Rolling;Sidelying to Sit Rolling: Supervision Sidelying to sit: Supervision       General bed mobility comments: Gross supervision for safety as pt transitioned to full sitting position. Pt was cued for log roll technique but no assist required.   Transfers Overall transfer level: Needs assistance Equipment used: None Transfers: Sit to/from Stand Sit to Stand: Min guard         General transfer comment: Pt was able to power-up to full standing position without assistance. Close hands-on guard for safety provided.   Ambulation/Gait Ambulation/Gait assistance: Min guard;Supervision Ambulation Distance (Feet): 200 Feet Assistive device: None Gait Pattern/deviations: Step-through pattern;Decreased stride length;Trunk  flexed Gait velocity: Decreased Gait velocity interpretation: Below normal speed for age/gender General Gait Details: Pt was able to ambulate well in the hall. Initially with HHA on L but was able to progress to supervision without UE support.   Stairs            Wheelchair Mobility    Modified Rankin (Stroke Patients Only)       Balance Overall balance assessment: Needs assistance Sitting-balance support: Feet supported;No upper extremity supported Sitting balance-Leahy Scale: Fair     Standing balance support: No upper extremity supported;During functional activity Standing balance-Leahy Scale: Fair                               Pertinent Vitals/Pain Pain Assessment: Faces Faces Pain Scale: Hurts a little bit Pain Location: Back Pain Descriptors / Indicators: Operative site guarding Pain Intervention(s): Limited activity within patient's tolerance;Monitored during session;Repositioned    Home Living Family/patient expects to be discharged to:: Private residence Living Arrangements: Alone Available Help at Discharge: Family;Available PRN/intermittently Type of Home: House (townhome) Home Access: Other (comment) (1 very small step in sidewalk)     Home Layout: Two level Home Equipment: Walker - 2 wheels;Shower seat;Grab bars - tub/shower;Wheelchair - manual (Stair lift, knee scooter)      Prior Function Level of Independence: Independent with assistive device(s)         Comments: Uses knee scooter to get to shower     Hand Dominance        Extremity/Trunk Assessment   Upper Extremity Assessment: Defer to OT evaluation  Lower Extremity Assessment: LLE deficits/detail   LLE Deficits / Details: Prior BKA with prosthetic (suction)  Cervical / Trunk Assessment: Other exceptions  Communication   Communication: No difficulties  Cognition Arousal/Alertness: Awake/alert Behavior During Therapy: WFL for tasks  assessed/performed Overall Cognitive Status: Within Functional Limits for tasks assessed                      General Comments      Exercises        Assessment/Plan    PT Assessment Patient needs continued PT services  PT Diagnosis Difficulty walking;Acute pain   PT Problem List Decreased strength;Decreased range of motion;Decreased activity tolerance;Decreased balance;Decreased mobility;Decreased knowledge of use of DME;Decreased safety awareness;Decreased knowledge of precautions  PT Treatment Interventions DME instruction;Gait training;Stair training;Functional mobility training;Therapeutic exercise;Therapeutic activities;Neuromuscular re-education;Patient/family education   PT Goals (Current goals can be found in the Care Plan section) Acute Rehab PT Goals Patient Stated Goal: Home at d/c PT Goal Formulation: With patient Time For Goal Achievement: 09/17/15 Potential to Achieve Goals: Good    Frequency Min 5X/week   Barriers to discharge        Co-evaluation PT/OT/SLP Co-Evaluation/Treatment: Yes Reason for Co-Treatment: Complexity of the patient's impairments (multi-system involvement);For patient/therapist safety PT goals addressed during session: Mobility/safety with mobility;Balance         End of Session Equipment Utilized During Treatment: Back brace Activity Tolerance: Patient tolerated treatment well Patient left: in chair;with call bell/phone within reach Nurse Communication: Mobility status         Time: [id number] PT Time Calculation (min) (ACUTE ONLY): 34 min   Charges:   PT Evaluation $PT Eval Moderate Complexity: 1 Procedure     PT G Codes:        Shelly [name] 2015/09/28, 10:28 AM   Shelly [name], PT, DPT Acute Rehabilitation Services Pager: [phone]

## 2015-09-10 NOTE — Progress Notes (Signed)
Patient ID: Shelly [name], female   DOB: [birth year], 65 y.o.   MRN: [id number]    Subjective: 1 Day Post-Op Procedure(s) (LRB):  L4-S1 Decompression/ Discetomy (N/A) Patient reports pain as 5 on 0-10 scale.  Incisional pain Denies CP or SOB.  Voiding without difficulty. Positive flatus. Objective: Vital signs in last 24 hours: Temp:  [98 F (36.7 C)-98.7 F (37.1 C)] 98.1 F (36.7 C) (04/06 1300) Pulse Rate:  [80-96] 80 (04/06 1300) Resp:  [16-18] 18 (04/06 1300) BP: (102-119)/(54-62) 115/60 mmHg (04/06 1300) SpO2:  [94 %-97 %] 94 % (04/06 1300)  Intake/Output from previous day: 04/05 0701 - 04/06 0700 In: 2000 [I.V.:2000] Out: 1950 [Urine:1675; Drains:75; Blood:200] Intake/Output this shift: Total I/O In: 600 [P.O.:600] Out: 370 [Urine:350; Drains:20]  Labs: No results for input(s): HGB in the last 72 hours. No results for input(s): WBC, RBC, HCT, PLT in the last 72 hours. No results for input(s): NA, K, CL, CO2, BUN, CREATININE, GLUCOSE, CALCIUM in the last 72 hours. No results for input(s): LABPT, INR in the last 72 hours.  Physical Exam: Neurologically intact ABD soft Sensation intact distally Intact pulses distally Dorsiflexion/Plantar flexion intact Incision: dressing C/D/I Compartment soft  Assessment/Plan: 1 Day Post-Op Procedure(s) (LRB):  L4-S1 Decompression/ Discetomy (N/A) Discharge home with home health  Pull wound drain F/u with Dr Rolena Infante in clinic in 2 weeks  Mayo, Darla Lesches for Dr. Melina Schools [facility] [phone] 09/10/2015, 4:10 PM

## 2015-09-10 NOTE — Op Note (Signed)
NAME:  Shelly [name], Shelly [name]           ACCOUNT NO.:  [id number]  MEDICAL RECORD NO.:  [id number]  LOCATION:  [id number]                        FACILITY:  [facility]  PHYSICIAN:  Annisha Baar D. Rolena Infante, M.D. DATE OF BIRTH:  [birth year]  DATE OF PROCEDURE:  09/09/2015 DATE OF DISCHARGE:                              OPERATIVE REPORT   PREOPERATIVE DIAGNOSES:  Lumbar spinal stenosis L4-5, L5-S1, large central and left-sided disk herniation L5-S1 with neural compression and neurogenic claudication.  POSTOPERATIVE DIAGNOSES:  Lumbar spinal stenosis L4-5, L5-S1, large central and left-sided disk herniation L5-S1 with neural compression and neurogenic claudication.  OPERATIVE PROCEDURE:  L4-S1 central decompression with lateral recess and foraminal decompression and excision of large left-sided disk herniation L5-S1, left side.  COMPLICATIONS:  None.  CONDITION:  Stable.  FIRST ASSISTANT:  Ronette Deter, my PA  HISTORY:  This is a very pleasant 65 year old woman who has been having progressive debilitating back, buttock, and radicular left leg pain. Attempts at conservative management had failed to alleviate her symptoms and so we elected to proceed with surgery.  All appropriate risks, benefits, and alternatives were discussed with the patient and consent was obtained.  OPERATIVE NOTE:  The patient was brought to the operating room, placed supine on the operating table.  After successful induction of general anesthesia and endotracheal intubation, TEDs, SCDs, and Foley were inserted and the patient was turned prone onto the Wilson frame.  All bony prominences were well padded and the back was prepped and draped in a standard fashion.  Time-out was taken confirming patient, procedure, and all other pertinent important data.  Once this was completed, 2 needles were placed into the back and x-ray was taken to localize the incision.  The incision site was infiltrated with 0.25% Marcaine with epi and  midline incision was made and sharp dissection was carried down to the deep fascia.  Deep fascia was sharply excised, incised, and I exposed the L4-L5 and these portion of the S1 spinous processes bilaterally and stripped the paraspinal muscles using Cobb and Bovie to expose the L4, 5, and S1 lamina as well as the 4-5 and 5-1 and a portion of the 3-4 facet capsules.  Care was taken not to violate the capsule itself.  A Penfield 4 was placed underneath the L5 lamina and a 2nd x- ray was taken confirming that this was the appropriate level.  I then removed the spinous process with double-action Leksell rongeur of L5 and the majority of the L4.  I then used a 3.0 forward-angled Karlin curette to develop a plane between the ligamentum flavum and the lamina and then started my central decompression at L5.  Once I had the majority of the lamina excised, I then went down into the right lateral recess, removing the overhanging osteophyte from the facet and the thickened ligamentum flavum.  On the right hand side, once I got past the ligamentum flavum, I was able to palpate the S1 pedicle and then perform a foraminotomy.  I also was able to undercut the leading edge of the S1 lamina to further decompress the area.  I then went up, palpated and decompressed out to the L5 pedicle and did an L5 foraminotomy.  Using  the same technique, I began L4 laminotomy.  There was significant adhesion of the thecal sac due to thickened ligamentum flavum and significant stenosis.  There was almost like a plaque-like structure on the thecal sac due to the severe compression.  Using Penfield 4, very carefully, I was able to ultimately develop a plane between the ligamentum flavum and the thecal sac.  Using my Kerrison rongeurs, I removed these ligamentum flavum until I had a decent L4 central decompression.  I was able to then pass a neural patty underneath the remaining L5 lamina and then completed my laminectomy  of L5.  At this point, I was able to work down into the right lateral recess in performing a foraminotomy of L4 and lateral recess decompression out to the L4 pedicle.  At this point, my [facility] could now freely go up into the lateral recess towards the L3 level.  I could palpate the medial border of the L4, L5, and S1 pedicles and I could palpate out the foramen of L4, 5, and S1 on that right side. There was no significant neural compression noted.  At this point, I switched to sides and went to the contralateral side and started working on the left side.  Using the same technique, I had used on the right side, I performed a lateral recess, decompression, and foraminotomy at L4 and L5.  There was significant compression of the S1 nerve root in the lateral recess.  At this point, I palpated the S1 pedicle and then began gently dissecting the S1 nerve root medially to expose the disk. Once I had an adequate space, I incised the annulus and was able to remove several large fragments of disk material.  This was both soft disk and calcified disk.  I then used my Epstein curette to work medially underneath the thecal sac removing more calcified disk material.  Once I completed, there was actual vivid in the posterior vertebral body of S1 secondary to the chronic calcified disk compression.  At this point, the S1 nerve root was completely under no tension.  It was freely mobilizable.  Using my [facility], I palpated underneath it, centrally underneath the anulus, and superiorly in the lateral recess and in each individual foramen as I did on the contralateral side.  At this point, I was very pleased with the lumbar decompression and diskectomy.  I then obtained hemostasis using bipolar electrocautery and FloSeal.  I then placed a thrombin-soaked Gelfoam patty over the exposed thecal sac as well as a deep drain then was brought out through a separate stab incision.  I then closed  the deep fascia with a running #1 barbed suture and then superficial with 2-0 Vicryl sutures, and a 3-0 Monocryl for the skin.  Steri-Strips and a dry dressing were applied.  The patient was ultimately extubated and transferred to PACU without incident.  At the end of the case, all needle and sponge counts were correct.  There were no adverse intraoperative events.     Ria Redcay D. Rolena Infante, M.D.     DDB/MEDQ  D:  09/09/2015  T:  09/10/2015  Job:  [id number]  cc:   Duane Lope D. Rolena Infante, M.D.'s Office

## 2015-09-10 NOTE — Evaluation (Signed)
Occupational Therapy Evaluation Patient Details Name: Shelly [name] MRN: [id number] DOB: Apr 24, 1951 Today's Date: 09/10/2015    History of Present Illness Pt is a 65 y/o female who presents s/p L4-S1 lumbar laminectomy/decompression on 09/09/15. PMH includes R BKA in 2013.    Clinical Impression   Pt reports she was independent with ADLs PTA. Currently pt is overall min guard for safety with functional mobility and min assist for ADLs. Began back, safety, and ADL education with pt. Pt planning to d/c home alone with intermittent supervision from family. Recommending HHOT for follow up in order to maximize independence and safety with ADLs and functional mobility upon return home. Pt would benefit from continued skilled OT to address established goals.    Follow Up Recommendations  Home health OT;Supervision - Intermittent    Equipment Recommendations  Tub/shower bench    Recommendations for Other Services       Precautions / Restrictions Precautions Precautions: Fall;Back Precaution Comments: Educated pt on back precuations. Restrictions Weight Bearing Restrictions: No      Mobility Bed Mobility Overal bed mobility: Needs Assistance Bed Mobility: Rolling;Sidelying to Sit Rolling: Supervision Sidelying to sit: Supervision       General bed mobility comments: Gross supervision for safety as pt transitioned to full sitting position. Pt was cued for log roll technique but no assist required.   Transfers Overall transfer level: Needs assistance Equipment used: None Transfers: Sit to/from Stand Sit to Stand: Min guard         General transfer comment: Pt was able to power-up to full standing position without assistance. Close hands-on guard for safety provided.     Balance Overall balance assessment: Needs assistance Sitting-balance support: Feet supported;No upper extremity supported Sitting balance-Leahy Scale: Fair     Standing balance support: No upper  extremity supported;During functional activity Standing balance-Leahy Scale: Fair                              ADL Overall ADL's : Needs assistance/impaired Eating/Feeding: Set up;Sitting   Grooming: Min guard;Standing Grooming Details (indicate cue type and reason): Educated pt on use of 2 cups for oral care. Upper Body Bathing: Supervision/ safety;Sitting   Lower Body Bathing: Minimal assistance;Sit to/from stand   Upper Body Dressing : Minimal assistance;Sitting Upper Body Dressing Details (indicate cue type and reason): to don brace Lower Body Dressing: Minimal assistance;Sit to/from stand Lower Body Dressing Details (indicate cue type and reason): to don prosthesis. Pt able to cross foot over opposite knee to don sock on L foot. Toilet Transfer: Min guard;Ambulation;BSC;RW (BSC over toilet) Toilet Transfer Details (indicate cue type and reason): Simulated by transfer from EOB to chair. Toileting- Water quality scientist and Hygiene: Min guard;Sit to/from Nurse, children's Details (indicate cue type and reason): Educated on use of tub bench; pt verbalized understanding. Functional mobility during ADLs: Min guard;Rolling walker General ADL Comments: Educated pt on maintaining precautions during functional activities, not sitting up in chair for longer than 52min-1hour at a time, donning prosthesis while maintaining back precautions, brace management and wear schedule.     Vision     Perception     Praxis      Pertinent Vitals/Pain Pain Assessment: Faces Faces Pain Scale: Hurts a little bit Pain Location: back Pain Descriptors / Indicators: Operative site guarding Pain Intervention(s): Limited activity within patient's tolerance;Monitored during session;Repositioned     Hand Dominance     Extremity/Trunk  Assessment Upper Extremity Assessment Upper Extremity Assessment: Overall WFL for tasks assessed   Lower Extremity Assessment Lower Extremity  Assessment: Defer to PT evaluation LLE Deficits / Details: Prior BKA with prosthetic (suction)   Cervical / Trunk Assessment Cervical / Trunk Assessment: Other exceptions Cervical / Trunk Exceptions: lumbar surgical incision present   Communication Communication Communication: No difficulties   Cognition Arousal/Alertness: Awake/alert Behavior During Therapy: WFL for tasks assessed/performed Overall Cognitive Status: Within Functional Limits for tasks assessed                     General Comments       Exercises       Shoulder Instructions      Home Living Family/patient expects to be discharged to:: Private residence Living Arrangements: Alone Available Help at Discharge: Family;Available PRN/intermittently Type of Home: House (townhome) Home Access: Other (comment) (1 very small step in sidewalk)     Home Layout: Two level Alternate Level Stairs-Number of Steps: Flight   Bathroom Shower/Tub: Tub/shower unit Shower/tub characteristics: Architectural technologist: Handicapped height Bathroom Accessibility: Yes   Home Equipment: Environmental consultant - 2 wheels;Shower seat;Grab bars - tub/shower;Wheelchair - [employer] (Stair lift, knee scooter)          Prior Functioning/Environment Level of Independence: Independent with assistive device(s)        Comments: Uses knee scooter to get to shower    OT Diagnosis: Generalized weakness;Acute pain   OT Problem List: Decreased strength;Impaired balance (sitting and/or standing);Decreased knowledge of use of DME or AE;Decreased knowledge of precautions;Pain   OT Treatment/Interventions: Self-care/ADL training;Energy conservation;DME and/or AE instruction;Patient/family education;Balance training    OT Goals(Current goals can be found in the care plan section) Acute Rehab OT Goals Patient Stated Goal: Home at d/c OT Goal Formulation: With patient Time For Goal Achievement: 09/24/15 Potential to Achieve Goals:  Good ADL Goals Pt Will Transfer to Toilet: with modified independence;ambulating;bedside commode (over toilet) Pt Will Perform Toileting - Clothing Manipulation and hygiene: with modified independence;sit to/from stand Pt Will Perform Tub/Shower Transfer: Tub transfer;with modified independence;ambulating;tub bench Additional ADL Goal #1: Pt will independently verbally recall 3/3 back precautions and maintain during ADL. Additional ADL Goal #2: Pt will independently don/doff back brace as precursor for ADLs and functional mobility. Additional ADL Goal #3: Pt will independently don/doff R BKA prosthesis while maintaining back precautions.  OT Frequency: Min 2X/week   Barriers to D/C: Decreased caregiver support  Pt lives alone       Co-evaluation PT/OT/SLP Co-Evaluation/Treatment: Yes Reason for Co-Treatment: Complexity of the patient's impairments (multi-system involvement);For patient/therapist safety PT goals addressed during session: Mobility/safety with mobility;Balance OT goals addressed during session: ADL's and self-care;Proper use of Adaptive equipment and DME;Other (comment) (mobility)      End of Session Equipment Utilized During Treatment: Gait belt;Rolling walker;Back brace Nurse Communication: Other (comment) (pt will purchase tub bench on her own)  Activity Tolerance: Patient tolerated treatment well Patient left: in chair;with call bell/phone within reach   Time: 0819-0853 OT Time Calculation (min): 34 min Charges:  OT General Charges $OT Visit: 1 Procedure OT Evaluation $OT Eval Moderate Complexity: 1 Procedure G-Codes:     Binnie Kand M.S., OTR/L Pager: [phone]  09/10/2015, 11:14 AM

## 2015-09-10 NOTE — Progress Notes (Signed)
Patient ID: Shelly [name], female   DOB: 06-06-1951, 65 y.o.   MRN: [id number]    Subjective: 1 Day Post-Op Procedure(s) (LRB):  L4-S1 Decompression/ Discetomy (N/A) Patient reports pain as 3 on 0-10 scale.  No leg pain  Denies CP or SOB.  Just had foley removed. Positive flatus. Objective: Vital signs in last 24 hours: Temp:  [97.3 F (36.3 C)-98.7 F (37.1 C)] 98.3 F (36.8 C) (04/06 0430) Pulse Rate:  [89-99] 94 (04/06 0430) Resp:  [9-19] 16 (04/06 0430) BP: (100-123)/(54-78) 111/54 mmHg (04/06 0430) SpO2:  [87 %-97 %] 97 % (04/06 0430)  Intake/Output from previous day: 04/05 0701 - 04/06 0700 In: 2000 [I.V.:2000] Out: 1950 [Urine:1675; Drains:75; Blood:200] Intake/Output this shift:    Labs: No results for input(s): HGB in the last 72 hours. No results for input(s): WBC, RBC, HCT, PLT in the last 72 hours. No results for input(s): NA, K, CL, CO2, BUN, CREATININE, GLUCOSE, CALCIUM in the last 72 hours. No results for input(s): LABPT, INR in the last 72 hours.  Physical Exam: Neurologically intact ABD soft Sensation intact distally Dorsiflexion/Plantar flexion intact Incision: no drainage Compartment soft Drain in place Assessment/Plan: 1 Day Post-Op Procedure(s) (LRB):  L4-S1 Decompression/ Discetomy (N/A) Advance diet Up with therapy  Pt has Mag citrate at bedside Consider pulling drain later today Consider inpatient rehab - need recommendation from PT  Lemuel Boodram, Darla Lesches for Dr. Melina Schools [facility] [phone] 09/10/2015, 7:14 AM

## 2015-09-22 ENCOUNTER — Ambulatory Visit (HOSPITAL_BASED_OUTPATIENT_CLINIC_OR_DEPARTMENT_OTHER): Payer: 59

## 2015-09-24 NOTE — Discharge Summary (Signed)
Physician Discharge Summary  Patient ID: Shelly [name] MRN: [id number] DOB/AGE: 09/18/1950 65 y.o.  Admit date: 09/09/2015 Discharge date: 09/24/2015  Admission Diagnoses:  DDD Lumbar and radicular leg pain  Discharge Diagnoses:  Active Problems:   Back pain   Past Medical History  Diagnosis Date  . Hyperlipidemia   . Leg edema     left  . Depression   . Anxiety   . GERD (gastroesophageal reflux disease)   . PONV (postoperative nausea and vomiting)   . Avascular necrosis of talus ([place])   . Cancer [facility])     vulva pre cancer had surgery    Surgeries: Procedure(s):  L4-S1 Decompression/ Discetomy on 09/09/2015   Consultants (if any):    Discharged Condition: Improved  Hospital Course: Shelly [name] is an 65 y.o. female who was admitted 09/09/2015 with a diagnosis of DDD lumbar and went to the operating room on 09/09/2015 and underwent the above named procedures.  Pt was discharged on 09/10/15.   She was given perioperative antibiotics:  Anti-infectives    Start     Dose/Rate Route Frequency Ordered Stop   09/09/15 1700  ceFAZolin (ANCEF) IVPB 1 g/50 mL premix     1 g 100 mL/hr over 30 Minutes Intravenous Every 8 hours 09/09/15 1517 09/10/15 0058   09/09/15 0800  ceFAZolin (ANCEF) IVPB 2g/100 mL premix     2 g 200 mL/hr over 30 Minutes Intravenous To [facility]Stay [facility] 09/08/15 1341 09/09/15 0850    .  She was given sequential compression devices, early ambulation, and TED for DVT prophylaxis.  She benefited maximally from the hospital stay and there were no complications.    Recent vital signs:  Filed Vitals:   09/10/15 1300 09/10/15 1800  BP: 115/60 120/58  Pulse: 80 75  Temp: 98.1 F (36.7 C) 98 F (36.7 C)  Resp: 18 18    Recent laboratory studies:  Lab Results  Component Value Date   HGB 13.3 09/03/2015   HGB 12.8 01/14/2015   HGB 13.7 05/28/2014   Lab Results  Component Value Date   WBC 6.1 09/03/2015   PLT 253 09/03/2015   No results found  for: INR Lab Results  Component Value Date   NA 140 09/03/2015   K 3.9 09/03/2015   CL 103 09/03/2015   CO2 27 09/03/2015   BUN 11 09/03/2015   CREATININE 0.93 09/03/2015   GLUCOSE 101* 09/03/2015    Discharge Medications:     Medication List    STOP taking these medications        HYDROcodone-acetaminophen 5-325 MG tablet  Commonly known as:  NORCO/VICODIN     metaxalone 800 MG tablet  Commonly known as:  SKELAXIN     traMADol 50 MG tablet  Commonly known as:  ULTRAM      TAKE these medications        acyclovir cream 5 %  Commonly known as:  ZOVIRAX  Apply 1 application topically every 4 (four) hours as needed (fever blisters).     buPROPion 300 MG 24 hr tablet  Commonly known as:  WELLBUTRIN XL  Take 300 mg by mouth daily.     calcium carbonate 750 MG chewable tablet  Commonly known as:  TUMS EX  Chew 1 tablet by mouth 2 (two) times daily.     CASCARA SAGRADA PO  Take 2 capsules by mouth daily.     CENTRUM SILVER PO  Take 1 tablet by mouth daily.  chlorpheniramine 4 MG tablet  Commonly known as:  CHLOR-TRIMETON  Take 8 mg by mouth at bedtime.     cycloSPORINE 0.05 % ophthalmic emulsion  Commonly known as:  RESTASIS  Place 1 drop into both eyes 2 (two) times daily.     ESTROVEN PO  Take 1 tablet by mouth 2 (two) times daily.     EVENING PRIMROSE OIL PO  Take 1 tablet by mouth 2 (two) times daily.     FLUoxetine 20 MG capsule  Commonly known as:  PROZAC  Take 20 mg by mouth daily.     folic acid Q000111Q MCG tablet  Commonly known as:  FOLVITE  Take 800 mcg by mouth daily.     hydrochlorothiazide 25 MG tablet  Commonly known as:  HYDRODIURIL  Take 1 tablet (25 mg total) by mouth daily.     KLOR-CON M10 10 MEQ tablet  Generic drug:  potassium chloride  Take 10 mEq by mouth daily.     lisdexamfetamine 70 MG capsule  Commonly known as:  VYVANSE  Take 70 mg by mouth daily.     MENOPAUTONIC Caps  Take 1 capsule by mouth 2 (two) times daily.      methocarbamol 500 MG tablet  Commonly known as:  ROBAXIN  Take 1 tablet (500 mg total) by mouth 3 (three) times daily as needed for muscle spasms.     montelukast 10 MG tablet  Commonly known as:  SINGULAIR  Take 1 tablet (10 mg total) by mouth at bedtime.     omeprazole 20 MG capsule  Commonly known as:  PRILOSEC  Take 40 mg by mouth every evening.     ondansetron 4 MG tablet  Commonly known as:  ZOFRAN  Take 1 tablet (4 mg total) by mouth every 8 (eight) hours as needed for nausea or vomiting.     oxyCODONE-acetaminophen 10-325 MG tablet  Commonly known as:  PERCOCET  Take 1 tablet by mouth every 4 (four) hours as needed for pain.     PROBIOTIC DAILY PO  Take 1 capsule by mouth daily.     simvastatin 20 MG tablet  Commonly known as:  ZOCOR  Take 20 mg by mouth at bedtime.     SM VITAMIN D3 4000 units Caps  Generic drug:  Cholecalciferol  Take 1 capsule by mouth daily.     SUPER B COMPLEX Tabs  Take 1 tablet by mouth daily.     triamcinolone cream 0.1 %  Commonly known as:  KENALOG  Apply 1 application topically 2 (two) times daily as needed (for itching from prosthetic).     valACYclovir 1000 MG tablet  Commonly known as:  VALTREX  Take 2,000 mg by mouth 2 (two) times daily as needed (for fever blister). Take 2 tablets every 12 hours at onset of fever blisters.     vitamin B-12 1000 MCG tablet  Commonly known as:  CYANOCOBALAMIN  Take 2,500 mcg by mouth daily.        Diagnostic Studies: Dg Lumbar Spine 2-3 Views  09/09/2015  CLINICAL DATA:  65 year old female undergoing lumbar surgery. Initial encounter. EXAM: LUMBAR SPINE - 2-3 VIEW COMPARISON:  Previously reported intraoperative film (# 2) from 1001 hours today. FINDINGS: Lumbar segmentation again appears to be normal and will be designated as such for this report. Two intraoperative portable cross-table lateral views of the lumbar spine: Film labeled #1 at 0945 hours. There is a cephalad needle directed at  the L3 level, and a  caudal needle directed at the L5 level. Film labeled # 3 at 1144 hours. Cephalad Surgical probe tip is at the L4 vertebral body level. Caudal Surgical probe tip is at the S1 level. IMPRESSION: Intraoperative localization as above. Electronically Signed   By: Genevie Ann M.D.   On: 09/09/2015 11:18   Dg Spine Portable 1 View  09/09/2015  CLINICAL DATA:  65 year old female undergoing lumbar surgery. Initial encounter. EXAM: PORTABLE SPINE - 1 VIEW COMPARISON:  0945 hours today. FINDINGS: No preoperative lumbar spine images are available, the judging by the 0945 hour intraoperative comparison today lumbar segmentation appears normal. Widespread lumbar vacuum disc phenomena incidentally noted. Intraoperative portable cross-table lateral view of the lumbar spine labeled #2 at 1001 hours. Surgical probe directed at the L5-S1 disc space , with tip overlying the L5 inferior articulating facet. IMPRESSION: Intraoperative localization at L5-S1. This was relayed by telephone to Dr. Melina Schools in the [employer] on 09/09/2015 at 09:16 . Electronically Signed   By: Genevie Ann M.D.   On: 09/09/2015 09:17    Disposition: 01-Home or Self Care        Follow-up Information    Follow up with Dahlia Bailiff, MD. Schedule an appointment as soon as possible for a visit in 2 weeks.   Specialty:  Orthopedic Surgery   Why:  For suture removal, For wound re-check, If symptoms worsen   Contact information:   [address] [place] [address] [id number]        Signed: Valinda Hoar 09/24/2015, 10:49 AM

## 2015-10-28 ENCOUNTER — Other Ambulatory Visit: Payer: Self-pay | Admitting: Cardiology

## 2015-12-22 DIAGNOSIS — S30861A Insect bite (nonvenomous) of abdominal wall, initial encounter: Secondary | ICD-10-CM | POA: Diagnosis not present

## 2015-12-22 DIAGNOSIS — E785 Hyperlipidemia, unspecified: Secondary | ICD-10-CM | POA: Diagnosis not present

## 2015-12-22 DIAGNOSIS — K219 Gastro-esophageal reflux disease without esophagitis: Secondary | ICD-10-CM | POA: Diagnosis not present

## 2015-12-22 DIAGNOSIS — F329 Major depressive disorder, single episode, unspecified: Secondary | ICD-10-CM | POA: Diagnosis not present

## 2015-12-22 DIAGNOSIS — L989 Disorder of the skin and subcutaneous tissue, unspecified: Secondary | ICD-10-CM | POA: Diagnosis not present

## 2016-01-01 DIAGNOSIS — N3000 Acute cystitis without hematuria: Secondary | ICD-10-CM | POA: Diagnosis not present

## 2016-01-07 ENCOUNTER — Encounter: Payer: Self-pay | Admitting: Interventional Cardiology

## 2016-01-07 ENCOUNTER — Ambulatory Visit (INDEPENDENT_AMBULATORY_CARE_PROVIDER_SITE_OTHER): Payer: PPO | Admitting: Interventional Cardiology

## 2016-01-07 VITALS — BP 120/82 | HR 100 | Ht 66.0 in | Wt 201.0 lb

## 2016-01-07 DIAGNOSIS — E785 Hyperlipidemia, unspecified: Secondary | ICD-10-CM

## 2016-01-07 DIAGNOSIS — I10 Essential (primary) hypertension: Secondary | ICD-10-CM | POA: Insufficient documentation

## 2016-01-07 DIAGNOSIS — R600 Localized edema: Secondary | ICD-10-CM

## 2016-01-07 DIAGNOSIS — I2584 Coronary atherosclerosis due to calcified coronary lesion: Secondary | ICD-10-CM

## 2016-01-07 DIAGNOSIS — I2510 Atherosclerotic heart disease of native coronary artery without angina pectoris: Secondary | ICD-10-CM | POA: Insufficient documentation

## 2016-01-07 NOTE — Progress Notes (Signed)
Cardiology Office Note   Date:  01/07/2016   ID:  Shelly [name], Shelly [name] [birth year], MRN [id number]  PCP:  Reginia Naas, MD    No chief complaint on file. nonobstructive coronary artery disease/coronary calcium   Wt Readings from Last 3 Encounters:  01/07/16 201 lb (91.2 kg)  09/03/15 199 lb 4.8 oz (90.4 kg)  07/08/15 197 lb (89.4 kg)       History of Present Illness: Shelly [name] is a 65 y.o. female  Who had coronary calcium noted in 2015.  She was asymptomatic and then had a stress test that was negative.  She has RF for CAD including high cholesterol.  Her brother recently had CABG.   SHe had back surgery and there were no cardiac issues at that time.    Walking is limited by back pain now.  Chronic left leg edema.  No chest pain or shortness of breath.     Past Medical History:  Diagnosis Date  . Anxiety   . Avascular necrosis of talus ([place])   . Cancer ([place])    vulva pre cancer had surgery  . Depression   . GERD (gastroesophageal reflux disease)   . Hyperlipidemia   . Leg edema    left  . PONV (postoperative nausea and vomiting)     Past Surgical History:  Procedure Laterality Date  . ANKLE FUSION  2011   right multiple   . ANKLE SURGERY  2010   right cordicompression  . BELOW KNEE LEG AMPUTATION    . LUMBAR LAMINECTOMY/DECOMPRESSION MICRODISCECTOMY N/A 09/09/2015   Procedure:  L4-S1 Decompression/ Discetomy;  Surgeon: Melina Schools, MD;  Location: [facility];  Service: Orthopedics;  Laterality: N/A;  . SKIN GRAFT    . TONSILLECTOMY    . TUBAL LIGATION  1983  . VULVECTOMY N/A 01/28/2015   Procedure: WIDE EXCISION VULVECTOMY;  Surgeon: Thurnell Lose, MD;  Location: [facility];  Service: Gynecology;  Laterality: N/A;     Current Outpatient Prescriptions  Medication Sig Dispense Refill  . acyclovir cream (ZOVIRAX) 5 % Apply 1 application topically every 4 (four) hours as needed (fever blisters).    . B Complex-C (SUPER B COMPLEX) TABS Take 1  tablet by mouth daily.    Marland Kitchen buPROPion (WELLBUTRIN XL) 300 MG 24 hr tablet Take 300 mg by mouth daily.      . calcium carbonate (TUMS EX) 750 MG chewable tablet Chew 1 tablet by mouth 2 (two) times daily.    . CASCARA SAGRADA PO Take 2 capsules by mouth daily.      . chlorpheniramine (CHLOR-TRIMETON) 4 MG tablet Take 8 mg by mouth at bedtime.    . Cholecalciferol (SM VITAMIN D3) 4000 UNITS CAPS Take 1 capsule by mouth daily.    . cycloSPORINE (RESTASIS) 0.05 % ophthalmic emulsion Place 1 drop into both eyes 2 (two) times daily.    Marland Kitchen EVENING PRIMROSE OIL PO Take 1 tablet by mouth 2 (two) times daily.    Marland Kitchen FLUoxetine (PROZAC) 20 MG capsule Take 20 mg by mouth daily.    . folic acid (FOLVITE) 092 MCG tablet Take 800 mcg by mouth daily.      . hydrochlorothiazide (HYDRODIURIL) 25 MG tablet TAKE 1 TABLET BY MOUTH EVERY DAY 90 tablet 1  . KLOR-CON M10 10 MEQ tablet TAKE 1 TABLET BY MOUTH EVERY DAY 90 tablet 1  . lisdexamfetamine (VYVANSE) 70 MG capsule Take 70 mg by mouth daily.    . methocarbamol (ROBAXIN) 500 MG  tablet Take 1 tablet (500 mg total) by mouth 3 (three) times daily as needed for muscle spasms. 60 tablet 0  . Misc Natural Products [facility]) CAPS Take 1 capsule by mouth 2 (two) times daily.    . montelukast (SINGULAIR) 10 MG tablet Take 1 tablet (10 mg total) by mouth at bedtime. 30 tablet 11  . Multiple Vitamins-Minerals (CENTRUM SILVER PO) Take 1 tablet by mouth daily.      . naproxen sodium (ANAPROX) 220 MG tablet Take 1 tablet by mouth as needed for painful procedure/intervention.    . Nutritional Supplements (ESTROVEN PO) Take 1 tablet by mouth 2 (two) times daily.    Marland Kitchen omeprazole (PRILOSEC) 20 MG capsule Take 40 mg by mouth every evening.     Marland Kitchen omeprazole (PRILOSEC) 40 MG capsule Take 40 mg by mouth daily.  1  . ondansetron (ZOFRAN) 4 MG tablet Take 1 tablet (4 mg total) by mouth every 8 (eight) hours as needed for nausea or vomiting. 20 tablet 0  . Probiotic Product (PROBIOTIC  DAILY PO) Take 1 capsule by mouth daily.    . simvastatin (ZOCOR) 20 MG tablet Take 20 mg by mouth at bedtime.      . triamcinolone cream (KENALOG) 0.1 % Apply 1 application topically 2 (two) times daily as needed (for itching from prosthetic).    . valACYclovir (VALTREX) 1000 MG tablet Take 2,000 mg by mouth 2 (two) times daily as needed (for fever blister). Take 2 tablets every 12 hours at onset of fever blisters.    . vitamin B-12 (CYANOCOBALAMIN) 1000 MCG tablet Take 2,500 mcg by mouth daily.      No current facility-administered medications for this visit.     Allergies:   Review of patient's allergies indicates no known allergies.    Social History:  The patient  reports that she quit smoking about 8 years ago. Her smoking use included Cigarettes. She has a 40.00 pack-year smoking history. She has never used smokeless tobacco. She reports that she drinks alcohol. She reports that she does not use drugs.   Family History:  The patient's family history includes Allergies in her mother; Breast cancer in her sister; Dementia (age of onset: 36) in her mother; Fibromyalgia in her mother; Heart attack (age of onset: 9) in her father; Heart disease in her brother; Heart murmur (age of onset: 85) in her sister; Multiple myeloma in her father; Other in her brother; Other (age of onset: 68) in her sister.    ROS:  Please see the history of present illness.   Otherwise, review of systems are positive for left leg swelling.   All other systems are reviewed and negative.    PHYSICAL EXAM: VS:  BP 120/82   Pulse 100   Ht '5\' 6"'$  (1.676 m)   Wt 201 lb (91.2 kg)   BMI 32.44 kg/m  , BMI Body mass index is 32.44 kg/m. GEN: Well nourished, well developed, in no acute distress  HEENT: normal  Neck: no JVD, carotid bruits, or masses Cardiac: RRR; no murmurs, rubs, or gallops, mild left leg edema ; prosthetic right leg Respiratory:  clear to auscultation bilaterally, normal work of breathing GI: soft,  nontender, nondistended, + BS MS: no deformity or atrophy  Skin: warm and dry, no rash Neuro:  Strength and sensation are intact Psych: euthymic mood, full affect   EKG:   The ekg ordered today demonstrates normal ECG   Recent Labs: 02/12/2015: Magnesium 1.7 09/03/2015: BUN 11; Creatinine, Ser  0.93; Hemoglobin 13.3; Platelets 253; Potassium 3.9; Sodium 140   Lipid Panel No results found for: CHOL, TRIG, HDL, CHOLHDL, VLDL, LDLCALC, LDLDIRECT   Other studies Reviewed: Additional studies/ records that were reviewed today with results demonstrating: 2016 stress test.   ASSESSMENT AND PLAN:  1. Coronary calcium: Negative stress test in 2016. No symptoms of obstructive coronary artery disease.  Brother with CABG recently. If sx change, would perform a test with imaging.  2. Hyperlipidemia: Would like to see her LDL below 130. Would start by switching rosuvastatin 10 mg daily which is planned per her primary care doctor, Dr. Tamala Julian. 3. Hypertension: Continue hydrochlorothiazide. Blood pressure well controlled.   Current medicines are reviewed at length with the patient today.  The patient concerns regarding her medicines were addressed.  The following changes have been made:  No change  Labs/ tests ordered today include:  No orders of the defined types were placed in this encounter.   Recommend 150 minutes/week of aerobic exercise Low fat, low carb, high fiber diet recommended  Disposition:   FU in 1 year   Signed, Larae Grooms, MD  01/07/2016 3:10 PM    [facility] [address], [place], [place]  [address] Phone: [phone]; Fax: [phone]

## 2016-01-07 NOTE — Patient Instructions (Signed)

## 2016-01-14 ENCOUNTER — Encounter: Payer: Self-pay | Admitting: Cardiology

## 2016-01-19 DIAGNOSIS — Z1159 Encounter for screening for other viral diseases: Secondary | ICD-10-CM | POA: Diagnosis not present

## 2016-01-19 DIAGNOSIS — J209 Acute bronchitis, unspecified: Secondary | ICD-10-CM | POA: Diagnosis not present

## 2016-01-19 DIAGNOSIS — Z1211 Encounter for screening for malignant neoplasm of colon: Secondary | ICD-10-CM | POA: Diagnosis not present

## 2016-01-26 DIAGNOSIS — J209 Acute bronchitis, unspecified: Secondary | ICD-10-CM | POA: Diagnosis not present

## 2016-02-02 DIAGNOSIS — H16141 Punctate keratitis, right eye: Secondary | ICD-10-CM | POA: Diagnosis not present

## 2016-02-09 DIAGNOSIS — M542 Cervicalgia: Secondary | ICD-10-CM | POA: Diagnosis not present

## 2016-02-12 DIAGNOSIS — R05 Cough: Secondary | ICD-10-CM | POA: Diagnosis not present

## 2016-02-12 DIAGNOSIS — Z1211 Encounter for screening for malignant neoplasm of colon: Secondary | ICD-10-CM | POA: Diagnosis not present

## 2016-02-15 DIAGNOSIS — Z89511 Acquired absence of right leg below knee: Secondary | ICD-10-CM | POA: Diagnosis not present

## 2016-02-23 DIAGNOSIS — M5412 Radiculopathy, cervical region: Secondary | ICD-10-CM | POA: Diagnosis not present

## 2016-02-24 DIAGNOSIS — M5137 Other intervertebral disc degeneration, lumbosacral region: Secondary | ICD-10-CM | POA: Diagnosis not present

## 2016-02-24 DIAGNOSIS — M5410 Radiculopathy, site unspecified: Secondary | ICD-10-CM | POA: Diagnosis not present

## 2016-02-24 DIAGNOSIS — M5126 Other intervertebral disc displacement, lumbar region: Secondary | ICD-10-CM | POA: Diagnosis not present

## 2016-02-29 DIAGNOSIS — M810 Age-related osteoporosis without current pathological fracture: Secondary | ICD-10-CM | POA: Diagnosis not present

## 2016-03-08 DIAGNOSIS — S92415A Nondisplaced fracture of proximal phalanx of left great toe, initial encounter for closed fracture: Secondary | ICD-10-CM | POA: Diagnosis not present

## 2016-03-22 DIAGNOSIS — D124 Benign neoplasm of descending colon: Secondary | ICD-10-CM | POA: Diagnosis not present

## 2016-03-22 DIAGNOSIS — D121 Benign neoplasm of appendix: Secondary | ICD-10-CM | POA: Diagnosis not present

## 2016-03-22 DIAGNOSIS — K5730 Diverticulosis of large intestine without perforation or abscess without bleeding: Secondary | ICD-10-CM | POA: Diagnosis not present

## 2016-03-22 DIAGNOSIS — D126 Benign neoplasm of colon, unspecified: Secondary | ICD-10-CM | POA: Diagnosis not present

## 2016-03-22 DIAGNOSIS — K6389 Other specified diseases of intestine: Secondary | ICD-10-CM | POA: Diagnosis not present

## 2016-03-22 DIAGNOSIS — D123 Benign neoplasm of transverse colon: Secondary | ICD-10-CM | POA: Diagnosis not present

## 2016-03-22 DIAGNOSIS — D122 Benign neoplasm of ascending colon: Secondary | ICD-10-CM | POA: Diagnosis not present

## 2016-03-22 DIAGNOSIS — Z1211 Encounter for screening for malignant neoplasm of colon: Secondary | ICD-10-CM | POA: Diagnosis not present

## 2016-03-23 DIAGNOSIS — M7061 Trochanteric bursitis, right hip: Secondary | ICD-10-CM | POA: Diagnosis not present

## 2016-03-23 DIAGNOSIS — M7631 Iliotibial band syndrome, right leg: Secondary | ICD-10-CM | POA: Diagnosis not present

## 2016-03-23 DIAGNOSIS — M25551 Pain in right hip: Secondary | ICD-10-CM | POA: Diagnosis not present

## 2016-03-24 ENCOUNTER — Other Ambulatory Visit: Payer: Self-pay | Admitting: *Deleted

## 2016-03-24 DIAGNOSIS — M5412 Radiculopathy, cervical region: Secondary | ICD-10-CM | POA: Diagnosis not present

## 2016-03-24 MED ORDER — POTASSIUM CHLORIDE CRYS ER 10 MEQ PO TBCR
10.0000 meq | EXTENDED_RELEASE_TABLET | Freq: Every day | ORAL | 3 refills | Status: DC
Start: 2016-03-24 — End: 2017-03-26

## 2016-03-24 MED ORDER — HYDROCHLOROTHIAZIDE 25 MG PO TABS: 25.0000 mg | ORAL_TABLET | Freq: Every day | ORAL | 3 refills | Status: DC

## 2016-03-29 DIAGNOSIS — D126 Benign neoplasm of colon, unspecified: Secondary | ICD-10-CM | POA: Diagnosis not present

## 2016-03-29 DIAGNOSIS — Z1211 Encounter for screening for malignant neoplasm of colon: Secondary | ICD-10-CM | POA: Diagnosis not present

## 2016-03-29 DIAGNOSIS — M5137 Other intervertebral disc degeneration, lumbosacral region: Secondary | ICD-10-CM | POA: Diagnosis not present

## 2016-03-31 ENCOUNTER — Other Ambulatory Visit (HOSPITAL_COMMUNITY)
Admission: RE | Admit: 2016-03-31 | Discharge: 2016-03-31 | Disposition: A | Discharge status: Home / Self Care | Payer: PPO | Source: Ambulatory Visit | Attending: Obstetrics and Gynecology | Admitting: Obstetrics and Gynecology

## 2016-03-31 ENCOUNTER — Other Ambulatory Visit: Payer: Self-pay | Admitting: Obstetrics and Gynecology

## 2016-03-31 DIAGNOSIS — Z1151 Encounter for screening for human papillomavirus (HPV): Secondary | ICD-10-CM | POA: Insufficient documentation

## 2016-03-31 DIAGNOSIS — Z1231 Encounter for screening mammogram for malignant neoplasm of breast: Secondary | ICD-10-CM

## 2016-03-31 DIAGNOSIS — Z01419 Encounter for gynecological examination (general) (routine) without abnormal findings: Secondary | ICD-10-CM | POA: Diagnosis not present

## 2016-03-31 DIAGNOSIS — R3915 Urgency of urination: Secondary | ICD-10-CM | POA: Diagnosis not present

## 2016-04-01 LAB — CYTOLOGY - PAP
Diagnosis: NEGATIVE
HPV: NOT DETECTED

## 2016-04-04 DIAGNOSIS — M7061 Trochanteric bursitis, right hip: Secondary | ICD-10-CM | POA: Diagnosis not present

## 2016-04-12 DIAGNOSIS — M7061 Trochanteric bursitis, right hip: Secondary | ICD-10-CM | POA: Diagnosis not present

## 2016-04-13 ENCOUNTER — Other Ambulatory Visit: Payer: Self-pay | Admitting: Surgery

## 2016-04-13 DIAGNOSIS — D121 Benign neoplasm of appendix: Secondary | ICD-10-CM | POA: Diagnosis not present

## 2016-04-19 DIAGNOSIS — M7061 Trochanteric bursitis, right hip: Secondary | ICD-10-CM | POA: Diagnosis not present

## 2016-04-21 ENCOUNTER — Ambulatory Visit
Admission: RE | Admit: 2016-04-21 | Discharge: 2016-04-21 | Disposition: A | Discharge status: Home / Self Care | Payer: PPO | Source: Ambulatory Visit | Attending: Obstetrics and Gynecology | Admitting: Obstetrics and Gynecology

## 2016-04-21 DIAGNOSIS — Z1231 Encounter for screening mammogram for malignant neoplasm of breast: Secondary | ICD-10-CM | POA: Diagnosis not present

## 2016-04-22 DIAGNOSIS — N3941 Urge incontinence: Secondary | ICD-10-CM | POA: Diagnosis not present

## 2016-04-24 DIAGNOSIS — M5412 Radiculopathy, cervical region: Secondary | ICD-10-CM | POA: Diagnosis not present

## 2016-04-25 DIAGNOSIS — M7061 Trochanteric bursitis, right hip: Secondary | ICD-10-CM | POA: Diagnosis not present

## 2016-05-04 DIAGNOSIS — L821 Other seborrheic keratosis: Secondary | ICD-10-CM | POA: Diagnosis not present

## 2016-05-04 DIAGNOSIS — L814 Other melanin hyperpigmentation: Secondary | ICD-10-CM | POA: Diagnosis not present

## 2016-05-04 DIAGNOSIS — D225 Melanocytic nevi of trunk: Secondary | ICD-10-CM | POA: Diagnosis not present

## 2016-05-04 DIAGNOSIS — D692 Other nonthrombocytopenic purpura: Secondary | ICD-10-CM | POA: Diagnosis not present

## 2016-05-04 DIAGNOSIS — L570 Actinic keratosis: Secondary | ICD-10-CM | POA: Diagnosis not present

## 2016-05-04 DIAGNOSIS — D2271 Melanocytic nevi of right lower limb, including hip: Secondary | ICD-10-CM | POA: Diagnosis not present

## 2016-05-04 DIAGNOSIS — D1801 Hemangioma of skin and subcutaneous tissue: Secondary | ICD-10-CM | POA: Diagnosis not present

## 2016-05-04 DIAGNOSIS — D2262 Melanocytic nevi of left upper limb, including shoulder: Secondary | ICD-10-CM | POA: Diagnosis not present

## 2016-05-06 DIAGNOSIS — Z4789 Encounter for other orthopedic aftercare: Secondary | ICD-10-CM | POA: Diagnosis not present

## 2016-05-06 DIAGNOSIS — M7061 Trochanteric bursitis, right hip: Secondary | ICD-10-CM | POA: Diagnosis not present

## 2016-05-06 DIAGNOSIS — M5137 Other intervertebral disc degeneration, lumbosacral region: Secondary | ICD-10-CM | POA: Diagnosis not present

## 2016-05-09 ENCOUNTER — Encounter (HOSPITAL_COMMUNITY): Payer: Self-pay

## 2016-05-09 ENCOUNTER — Encounter (HOSPITAL_COMMUNITY)
Admission: RE | Admit: 2016-05-09 | Discharge: 2016-05-09 | Disposition: A | Discharge status: Home / Self Care | Payer: PPO | Source: Ambulatory Visit | Attending: Surgery | Admitting: Surgery

## 2016-05-09 DIAGNOSIS — F419 Anxiety disorder, unspecified: Secondary | ICD-10-CM | POA: Diagnosis not present

## 2016-05-09 DIAGNOSIS — Z8349 Family history of other endocrine, nutritional and metabolic diseases: Secondary | ICD-10-CM | POA: Diagnosis not present

## 2016-05-09 DIAGNOSIS — Z8261 Family history of arthritis: Secondary | ICD-10-CM | POA: Diagnosis not present

## 2016-05-09 DIAGNOSIS — K219 Gastro-esophageal reflux disease without esophagitis: Secondary | ICD-10-CM | POA: Diagnosis not present

## 2016-05-09 DIAGNOSIS — E7800 Pure hypercholesterolemia, unspecified: Secondary | ICD-10-CM | POA: Diagnosis not present

## 2016-05-09 DIAGNOSIS — Z833 Family history of diabetes mellitus: Secondary | ICD-10-CM | POA: Diagnosis not present

## 2016-05-09 DIAGNOSIS — D120 Benign neoplasm of cecum: Secondary | ICD-10-CM | POA: Diagnosis not present

## 2016-05-09 DIAGNOSIS — Z8249 Family history of ischemic heart disease and other diseases of the circulatory system: Secondary | ICD-10-CM | POA: Diagnosis not present

## 2016-05-09 DIAGNOSIS — Z86010 Personal history of colonic polyps: Secondary | ICD-10-CM | POA: Diagnosis not present

## 2016-05-09 DIAGNOSIS — Z87891 Personal history of nicotine dependence: Secondary | ICD-10-CM | POA: Diagnosis not present

## 2016-05-09 DIAGNOSIS — Z836 Family history of other diseases of the respiratory system: Secondary | ICD-10-CM | POA: Diagnosis not present

## 2016-05-09 DIAGNOSIS — Z803 Family history of malignant neoplasm of breast: Secondary | ICD-10-CM | POA: Diagnosis not present

## 2016-05-09 DIAGNOSIS — M549 Dorsalgia, unspecified: Secondary | ICD-10-CM | POA: Diagnosis not present

## 2016-05-09 DIAGNOSIS — Z6831 Body mass index (BMI) 31.0-31.9, adult: Secondary | ICD-10-CM | POA: Diagnosis not present

## 2016-05-09 DIAGNOSIS — I10 Essential (primary) hypertension: Secondary | ICD-10-CM | POA: Diagnosis not present

## 2016-05-09 DIAGNOSIS — M797 Fibromyalgia: Secondary | ICD-10-CM | POA: Diagnosis not present

## 2016-05-09 DIAGNOSIS — Z79899 Other long term (current) drug therapy: Secondary | ICD-10-CM | POA: Diagnosis not present

## 2016-05-09 HISTORY — DX: Unspecified osteoarthritis, unspecified site: M19.90

## 2016-05-09 HISTORY — DX: Stress incontinence (female) (male): N39.3

## 2016-05-09 HISTORY — DX: Essential (primary) hypertension: I10

## 2016-05-09 HISTORY — DX: Unspecified hearing loss, unspecified ear: H91.90

## 2016-05-09 HISTORY — DX: Personal history of pneumonia (recurrent): Z87.01

## 2016-05-09 LAB — BASIC METABOLIC PANEL
Anion gap: 7 (ref 5–15)
BUN: 13 mg/dL (ref 6–20)
CO2: 27 mmol/L (ref 22–32)
Calcium: 9.6 mg/dL (ref 8.9–10.3)
Chloride: 106 mmol/L (ref 101–111)
Creatinine, Ser: 0.78 mg/dL (ref 0.44–1.00)
GFR calc Af Amer: >60 mL/min (ref >60)
GFR calc non Af Amer: >60 mL/min (ref >60)
Glucose, Bld: 80 mg/dL (ref 65–99)
Potassium: 4 mmol/L (ref 3.5–5.1)
Sodium: 140 mmol/L (ref 135–145)

## 2016-05-09 LAB — CBC
HCT: 40.4 % (ref 36.0–46.0)
Hemoglobin: 13.3 g/dL (ref 12.0–15.0)
MCH: 30.9 pg (ref 26.0–34.0)
MCHC: 32.9 g/dL (ref 30.0–36.0)
MCV: 93.7 fL (ref 78.0–100.0)
Platelets: 263 10*3/uL (ref 150–400)
RBC: 4.31 MIL/uL (ref 3.87–5.11)
RDW: 13.4 % (ref 11.5–15.5)
WBC: 6.6 10*3/uL (ref 4.0–10.5)

## 2016-05-09 NOTE — Pre-Procedure Instructions (Signed)
Ashyah Lenze  05/09/2016      [id number] - [place], [place] [address] [place] [address] Phone: [phone] Fax: [phone]  [facility], [place], [address] [place] [address] Phone: [phone] Fax: [phone]    Your procedure is scheduled on Tuesday, December 5th, 2017.  Report to [facility] Admitting at 5:30 A.M.   Call this number if you have problems the morning of surgery:  [phone]   Remember:  Do not eat food or drink liquids after midnight.   Take these medicines the morning of surgery with A SIP OF WATER: Acetaminophen (Tylenol) if needed, Bupropion (Wellbutrin), Famotidine (Pepcid), Fluoxetine (Prozac), Solifenacin (Vesicare), Tramadol (Ultram) if needed.  Stop taking: Aspirin, NSAIDS, Aleve, Naproxen, Ibuprofen, Advil, Motrin, BC's, Goody's, Fish oil, all herbal medications, and all vitamins.    Do not wear jewelry, make-up or nail polish.  Do not wear lotions, powders, or perfumes, or deoderant.  Do not shave 48 hours prior to surgery.   Do not bring valuables to the hospital.  [facility] is not responsible for any belongings or valuables.  Contacts, dentures or bridgework may not be worn into surgery.  Leave your suitcase in the car.  After surgery it may be brought to your room.  For patients admitted to the hospital, discharge time will be determined by your treatment team.  Patients discharged the day of surgery will not be allowed to drive home.   Special instructions:  Preparing for Surgery.   [facility]- Preparing For Surgery  Before surgery, you can play an important role. Because skin is not sterile, your skin needs to be as free of germs as possible. You can reduce the number of germs on your skin by washing with CHG (chlorahexidine gluconate) Soap before surgery.  CHG is an antiseptic  cleaner which kills germs and bonds with the skin to continue killing germs even after washing.  Please do not use if you have an allergy to CHG or antibacterial soaps. If your skin becomes reddened/irritated stop using the CHG.  Do not shave (including legs and underarms) for at least 48 hours prior to first CHG shower. It is OK to shave your face.  Please follow these instructions carefully.   1. Shower the NIGHT BEFORE SURGERY and the MORNING OF SURGERY with CHG.   2. If you chose to wash your hair, wash your hair first as usual with your normal shampoo.  3. After you shampoo, rinse your hair and body thoroughly to remove the shampoo.  4. Use CHG as you would any other liquid soap. You can apply CHG directly to the skin and wash gently with a scrungie or a clean washcloth.   5. Apply the CHG Soap to your body ONLY FROM THE NECK DOWN.  Do not use on open wounds or open sores. Avoid contact with your eyes, ears, mouth and genitals (private parts). Wash genitals (private parts) with your normal soap.  6. Wash thoroughly, paying special attention to the area where your surgery will be performed.  7. Thoroughly rinse your body with warm water from the neck down.  8. DO NOT shower/wash with your normal soap after using and rinsing off the CHG Soap.  9. Pat yourself dry with a CLEAN TOWEL.   10. Wear CLEAN PAJAMAS   11. Place CLEAN SHEETS on your bed the night of your  first shower and DO NOT SLEEP WITH PETS.  Day of Surgery: Do not apply any deodorants/lotions. Please wear clean clothes to the hospital/surgery center.     Please read over the following fact sheets that you were given.

## 2016-05-09 NOTE — H&P (Signed)
Shelly [name]. Shelly [name] 04/13/2016 2:07 PM Location: [facility] Patient #: [id number] DOB: [birth year] Divorced / Language: Cleophus Molt / Race: White Female   History of Present Illness (Shelly [name] A. Shelly Linden MD; 04/13/2016 2:37 PM) Patient words: New-cecal polyp.  The patient is a 64 year old female who presents with a colonic polyp. This is a pleasant patient referred by Dr. Arta [name] for evaluation of an adenomatous polyp of the appendix. This was found on recent screening colonoscopy. Because of its location, it could not be removed at the time of colonoscopy therefore appendectomy has been recommended. She had other precancerous polyps identified as well. She has also had recent precancerous lesions removed from her vulva. She is currently otherwise without complaints. She has no abdominal pain.   Other Problems Shelly [name], CMA; 04/13/2016 2:07 PM) Anxiety Disorder Back Pain Depression Gastroesophageal Reflux Disease General anesthesia - complications High blood pressure Hypercholesterolemia  Past Surgical History Shelly [name], CMA; 04/13/2016 2:07 PM) Colon Polyp Removal - Colonoscopy Foot Surgery Right. Oral Surgery Spinal Surgery - Lower Back Tonsillectomy  Diagnostic Studies History Shelly [name], CMA; 04/13/2016 2:07 PM) Colonoscopy within last year Mammogram 1-3 years ago Pap Smear 1-5 years ago  Allergies Shelly [name], CMA; 04/13/2016 2:08 PM) No Known Drug Allergies11/01/2016  Medication History Shelly [name], CMA; 04/13/2016 2:12 PM) HydroCHLOROthiazide (25MG  Tablet, Oral) Active. BuPROPion HCl ER (XL) (300MG  Tablet ER 24HR, Oral) Active. Rosuvastatin Calcium (20MG  Tablet, Oral) Active. FLUoxetine HCl (20MG  Tablet, Oral) Active. Amphetamine-Dextroamphet ER (25MG  Capsule ER 24HR, Oral) Active. Omeprazole (40MG  Capsule DR, Oral) Active. Montelukast Sodium (10MG  Tablet, Oral) Active. Xiidra (5% Solution, Ophthalmic)  Active. Prolia (60MG /ML Solution, Subcutaneous) Active. Restasis (0.05% Emulsion, Ophthalmic) Active. Remifemin (20MG  Tablet, Oral) Active. Evening Primrose Oil (500MG  Capsule, Oral) Active. Pepcid (20MG  Tablet, Oral) Active. Menopautonic (Oral) Active. Chlorpheniramine Maleate (4MG  Tablet, Oral) Active. Trunature Digestive Probiotic (Oral) Active. Centrum Silver (Oral) Active. Cyanocobalamin (2500MCG Tablet, Oral) Active. Cascara [place]rada (450MG  Capsule, Oral) Active. Tums Ultra 1000 (1000MG  Tablet Chewable, Oral) Active. Vitamin D3 (2000UNIT Tablet Chewable, Oral) Active. Medications Reconciled  Social History Shelly [name], CMA; 04/13/2016 2:07 PM) Alcohol use Occasional alcohol use. Caffeine use Carbonated beverages, Tea. Illicit drug use Remotely quit drug use. Tobacco use Former smoker.  Family History Shelly [name], CMA; 04/13/2016 2:07 PM) Arthritis Father, Mother. Breast Cancer Sister. Depression Mother, Sister. Diabetes Mellitus Sister. Heart Disease Sister. Heart disease in female family member before age 16 Heart disease in female family member before age 44 Hypertension Father. Melanoma Father. Respiratory Condition Father. Thyroid problems Sister.  Pregnancy / Birth History Shelly [name], CMA; 04/13/2016 2:07 PM) Age at menarche 8 years. Age of menopause [age] Gravida 3 Length (months) of breastfeeding 3-6 Maternal age 33-30 Para 3    Review of Systems Shelly [name] CMA; 04/13/2016 2:07 PM) General Not Present- Appetite Loss, Chills, Fatigue, Fever, Night Sweats, Weight Gain and Weight Loss. Skin Not Present- Change in Wart/Mole, Dryness, Hives, Jaundice, New Lesions, Non-Healing Wounds, Rash and Ulcer. HEENT Present- Wears glasses/contact lenses. Not Present- Earache, Hearing Loss, Hoarseness, Nose Bleed, Oral Ulcers, Ringing in the Ears, Seasonal Allergies, Sinus Pain, Sore Throat, Visual Disturbances and Yellow  Eyes. Respiratory Not Present- Bloody sputum, Chronic Cough, Difficulty Breathing, Snoring and Wheezing. Breast Not Present- Breast Mass, Breast Pain, Nipple Discharge and Skin Changes. Cardiovascular Not Present- Chest Pain, Difficulty Breathing Lying Down, Leg Cramps, Palpitations, Rapid Heart Rate, Shortness of Breath and Swelling of Extremities. Gastrointestinal Not Present- Abdominal Pain, Bloating, Bloody Stool, Change in Bowel Habits, Chronic diarrhea,  Constipation, Difficulty Swallowing, Excessive gas, Gets full quickly at meals, Hemorrhoids, Indigestion, Nausea, Rectal Pain and Vomiting. Female Genitourinary Present- Urgency. Not Present- Frequency, Nocturia, Painful Urination and Pelvic Pain. Musculoskeletal Present- Back Pain. Not Present- Joint Pain, Joint Stiffness, Muscle Pain, Muscle Weakness and Swelling of Extremities. Neurological Not Present- Decreased Memory, Fainting, Headaches, Numbness, Seizures, Tingling, Tremor, Trouble walking and Weakness. Psychiatric Not Present- Anxiety, Bipolar, Change in Sleep Pattern, Depression, Fearful and Frequent crying. Endocrine Not Present- Cold Intolerance, Excessive Hunger, Hair Changes, Heat Intolerance, Hot flashes and New Diabetes. Hematology Not Present- Blood Thinners, Easy Bruising, Excessive bleeding, Gland problems, HIV and Persistent Infections.  Vitals (Shelly [name] CMA; 04/13/2016 2:08 PM) 04/13/2016 2:07 PM Weight: 201.4 lb Height: 66in Body Surface Area: 2.01 m Body Mass Index: 32.51 kg/m  Temp.: 98.41F(Oral)  BP: 140/80 (Sitting, Left Arm, Standard)       Physical Exam (Shelly [name] A. Shelly Linden MD; 04/13/2016 2:37 PM) General Mental Status-Alert. General Appearance-Consistent with stated age. Hydration-Well hydrated. Voice-Normal.  Head and Neck Head-normocephalic, atraumatic with no lesions or palpable masses. Trachea-midline. Thyroid Gland Characteristics - normal size and  consistency.  Eye Eyeball - Bilateral-Extraocular movements intact. Sclera/Conjunctiva - Bilateral-No scleral icterus.  Chest and Lung Exam Chest and lung exam reveals -quiet, even and easy respiratory effort with no use of accessory muscles and on auscultation, normal breath sounds, no adventitious sounds and normal vocal resonance. Inspection Chest Wall - Normal. Back - normal.  Breast - Did not examine.  Cardiovascular Cardiovascular examination reveals -normal heart sounds, regular rate and rhythm with no murmurs and normal pedal pulses bilaterally.  Abdomen Inspection Inspection of the abdomen reveals - No Hernias. Skin - Scar - no surgical scars. Palpation/Percussion Palpation and Percussion of the abdomen reveal - Soft, Non Tender, No Rebound tenderness, No Rigidity (guarding) and No hepatosplenomegaly. Auscultation Auscultation of the abdomen reveals - Bowel sounds normal.  Neurologic - Did not examine.  Musculoskeletal - Did not examine.  Lymphatic - Did not examine.    Assessment & Plan (Melat Wrisley A. 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 Laparoscopic appendectomy has been recommended. I would have to include part of the cecum with this because of the location of the polyp. Surgical procedure with her in detail. I discussed the risk of surgery which includes but is not limited to bleeding, infection, need for further surgery if malignancy is identified, leak of the staple line, need for further surgery, cardiopulmonary issues, DVT, etc. She understands and wished to proceed with surgery which will be scheduled

## 2016-05-09 NOTE — Progress Notes (Signed)
PCP - Dr. Carol Ada Cardiologist - Dr. Irish Lack  EKG - 01/07/16 CXR - denies  Echo - denies Stress test - 2016 Cardiac Cath - denies  Patient denies chest pain and shortness of breath at PAT appointment.

## 2016-05-10 ENCOUNTER — Observation Stay (HOSPITAL_COMMUNITY)
Admission: RE | Admit: 2016-05-10 | Discharge: 2016-05-11 | Disposition: A | Discharge status: Home / Self Care | Payer: PPO | Source: Ambulatory Visit | Attending: Surgery | Admitting: Surgery

## 2016-05-10 ENCOUNTER — Encounter (HOSPITAL_COMMUNITY)
Admission: RE | Disposition: A | Discharge status: Home / Self Care | Payer: Self-pay | Source: Ambulatory Visit | Attending: Surgery

## 2016-05-10 ENCOUNTER — Ambulatory Visit (HOSPITAL_COMMUNITY): Payer: PPO | Admitting: Certified Registered Nurse Anesthetist

## 2016-05-10 ENCOUNTER — Encounter (HOSPITAL_COMMUNITY): Payer: Self-pay | Admitting: *Deleted

## 2016-05-10 DIAGNOSIS — Z8261 Family history of arthritis: Secondary | ICD-10-CM | POA: Insufficient documentation

## 2016-05-10 DIAGNOSIS — F419 Anxiety disorder, unspecified: Secondary | ICD-10-CM | POA: Insufficient documentation

## 2016-05-10 DIAGNOSIS — Z79899 Other long term (current) drug therapy: Secondary | ICD-10-CM | POA: Diagnosis not present

## 2016-05-10 DIAGNOSIS — K219 Gastro-esophageal reflux disease without esophagitis: Secondary | ICD-10-CM | POA: Insufficient documentation

## 2016-05-10 DIAGNOSIS — Z833 Family history of diabetes mellitus: Secondary | ICD-10-CM | POA: Diagnosis not present

## 2016-05-10 DIAGNOSIS — E7800 Pure hypercholesterolemia, unspecified: Secondary | ICD-10-CM | POA: Insufficient documentation

## 2016-05-10 DIAGNOSIS — D121 Benign neoplasm of appendix: Secondary | ICD-10-CM | POA: Diagnosis not present

## 2016-05-10 DIAGNOSIS — D120 Benign neoplasm of cecum: Secondary | ICD-10-CM | POA: Diagnosis not present

## 2016-05-10 DIAGNOSIS — Z8349 Family history of other endocrine, nutritional and metabolic diseases: Secondary | ICD-10-CM | POA: Insufficient documentation

## 2016-05-10 DIAGNOSIS — Z86010 Personal history of colonic polyps: Secondary | ICD-10-CM | POA: Insufficient documentation

## 2016-05-10 DIAGNOSIS — Z803 Family history of malignant neoplasm of breast: Secondary | ICD-10-CM | POA: Insufficient documentation

## 2016-05-10 DIAGNOSIS — M549 Dorsalgia, unspecified: Secondary | ICD-10-CM | POA: Diagnosis not present

## 2016-05-10 DIAGNOSIS — Z87891 Personal history of nicotine dependence: Secondary | ICD-10-CM | POA: Diagnosis not present

## 2016-05-10 DIAGNOSIS — I10 Essential (primary) hypertension: Secondary | ICD-10-CM | POA: Insufficient documentation

## 2016-05-10 DIAGNOSIS — M797 Fibromyalgia: Secondary | ICD-10-CM | POA: Insufficient documentation

## 2016-05-10 DIAGNOSIS — Z8249 Family history of ischemic heart disease and other diseases of the circulatory system: Secondary | ICD-10-CM | POA: Insufficient documentation

## 2016-05-10 DIAGNOSIS — F418 Other specified anxiety disorders: Secondary | ICD-10-CM | POA: Diagnosis not present

## 2016-05-10 DIAGNOSIS — D126 Benign neoplasm of colon, unspecified: Secondary | ICD-10-CM | POA: Diagnosis present

## 2016-05-10 DIAGNOSIS — Z836 Family history of other diseases of the respiratory system: Secondary | ICD-10-CM | POA: Insufficient documentation

## 2016-05-10 DIAGNOSIS — I2510 Atherosclerotic heart disease of native coronary artery without angina pectoris: Secondary | ICD-10-CM | POA: Diagnosis not present

## 2016-05-10 DIAGNOSIS — Z6831 Body mass index (BMI) 31.0-31.9, adult: Secondary | ICD-10-CM | POA: Insufficient documentation

## 2016-05-10 HISTORY — PX: APPENDECTOMY: SHX54

## 2016-05-10 HISTORY — PX: LAPAROSCOPIC APPENDECTOMY: SHX408

## 2016-05-10 SURGERY — APPENDECTOMY, LAPAROSCOPIC
Anesthesia: General | Site: Abdomen

## 2016-05-10 MED ORDER — BUPROPION HCL ER (XL) 300 MG PO TB24
300.0000 mg | ORAL_TABLET | Freq: Every day | ORAL | Status: DC
  Administered 2016-05-11: 300 mg via ORAL
  Filled 2016-05-10: qty 1

## 2016-05-10 MED ORDER — LIDOCAINE 2% (20 MG/ML) 5 ML SYRINGE
INTRAMUSCULAR | Status: DC | PRN
  Administered 2016-05-10: 40 mg via INTRAVENOUS

## 2016-05-10 MED ORDER — CEFAZOLIN SODIUM-DEXTROSE 2-4 GM/100ML-% IV SOLN
2.0000 g | INTRAVENOUS | Status: AC
  Administered 2016-05-10: 2 g via INTRAVENOUS
  Filled 2016-05-10: qty 100

## 2016-05-10 MED ORDER — ROCURONIUM BROMIDE 10 MG/ML (PF) SYRINGE
PREFILLED_SYRINGE | INTRAVENOUS | Status: AC
  Filled 2016-05-10: qty 10

## 2016-05-10 MED ORDER — OXYCODONE HCL 5 MG PO TABS: 5.0000 mg | ORAL_TABLET | Freq: Once | ORAL | Status: DC | PRN

## 2016-05-10 MED ORDER — FENTANYL CITRATE (PF) 100 MCG/2ML IJ SOLN
INTRAMUSCULAR | Status: AC
Start: 2016-05-10 — End: 2016-05-10
  Filled 2016-05-10: qty 4

## 2016-05-10 MED ORDER — ONDANSETRON 4 MG PO TBDP: 4.0000 mg | ORAL_TABLET | Freq: Four times a day (QID) | ORAL | Status: DC | PRN

## 2016-05-10 MED ORDER — ROCURONIUM BROMIDE 10 MG/ML (PF) SYRINGE
PREFILLED_SYRINGE | INTRAVENOUS | Status: DC | PRN
  Administered 2016-05-10: 30 mg via INTRAVENOUS

## 2016-05-10 MED ORDER — CHLORHEXIDINE GLUCONATE CLOTH 2 % EX PADS: 6.0000 | MEDICATED_PAD | Freq: Once | CUTANEOUS | Status: DC

## 2016-05-10 MED ORDER — PROPOFOL 10 MG/ML IV BOLUS
INTRAVENOUS | Status: AC
  Filled 2016-05-10: qty 20

## 2016-05-10 MED ORDER — POTASSIUM CHLORIDE IN NACL 20-0.9 MEQ/L-% IV SOLN
INTRAVENOUS | Status: DC
  Administered 2016-05-10: 14:00:00 via INTRAVENOUS
  Filled 2016-05-10: qty 1000

## 2016-05-10 MED ORDER — PHENYLEPHRINE 40 MCG/ML (10ML) SYRINGE FOR IV PUSH (FOR BLOOD PRESSURE SUPPORT)
PREFILLED_SYRINGE | INTRAVENOUS | Status: DC | PRN
  Administered 2016-05-10 (×2): 40 ug via INTRAVENOUS

## 2016-05-10 MED ORDER — BUPIVACAINE-EPINEPHRINE 0.25% -1:200000 IJ SOLN
INTRAMUSCULAR | Status: DC | PRN
  Administered 2016-05-10: 30 mL

## 2016-05-10 MED ORDER — CHLORHEXIDINE GLUCONATE CLOTH 2 % EX PADS
6.0000 | MEDICATED_PAD | Freq: Once | CUTANEOUS | Status: DC
Start: 2016-05-10 — End: 2016-05-10

## 2016-05-10 MED ORDER — DEXAMETHASONE SODIUM PHOSPHATE 10 MG/ML IJ SOLN
INTRAMUSCULAR | Status: DC | PRN
  Administered 2016-05-10: 10 mg via INTRAVENOUS

## 2016-05-10 MED ORDER — HYDROMORPHONE HCL 1 MG/ML IJ SOLN
INTRAMUSCULAR | Status: AC
  Filled 2016-05-10: qty 1

## 2016-05-10 MED ORDER — BUPIVACAINE-EPINEPHRINE (PF) 0.25% -1:200000 IJ SOLN
INTRAMUSCULAR | Status: AC
  Filled 2016-05-10: qty 30

## 2016-05-10 MED ORDER — OXYCODONE-ACETAMINOPHEN 5-325 MG PO TABS
1.0000 | ORAL_TABLET | ORAL | Status: DC | PRN
  Filled 2016-05-10: qty 2

## 2016-05-10 MED ORDER — LACTATED RINGERS IV SOLN
INTRAVENOUS | Status: DC | PRN
  Administered 2016-05-10: 07:00:00 via INTRAVENOUS

## 2016-05-10 MED ORDER — ENOXAPARIN SODIUM 40 MG/0.4ML ~~LOC~~ SOLN
40.0000 mg | SUBCUTANEOUS | Status: DC
  Administered 2016-05-11: 40 mg via SUBCUTANEOUS
  Filled 2016-05-10: qty 0.4

## 2016-05-10 MED ORDER — OXYCODONE HCL 5 MG/5ML PO SOLN: 5.0000 mg | Freq: Once | ORAL | Status: DC | PRN

## 2016-05-10 MED ORDER — FLUOXETINE HCL 20 MG PO CAPS
20.0000 mg | ORAL_CAPSULE | Freq: Every day | ORAL | Status: DC
  Administered 2016-05-11: 20 mg via ORAL
  Filled 2016-05-10: qty 1

## 2016-05-10 MED ORDER — MORPHINE SULFATE (PF) 2 MG/ML IV SOLN: 1.0000 mg | INTRAVENOUS | Status: DC | PRN

## 2016-05-10 MED ORDER — IBUPROFEN 600 MG PO TABS
600.0000 mg | ORAL_TABLET | Freq: Four times a day (QID) | ORAL | Status: DC | PRN
  Administered 2016-05-11: 600 mg via ORAL
  Filled 2016-05-10: qty 1

## 2016-05-10 MED ORDER — LIDOCAINE 2% (20 MG/ML) 5 ML SYRINGE
INTRAMUSCULAR | Status: AC
  Filled 2016-05-10: qty 5

## 2016-05-10 MED ORDER — PHENYLEPHRINE 40 MCG/ML (10ML) SYRINGE FOR IV PUSH (FOR BLOOD PRESSURE SUPPORT)
PREFILLED_SYRINGE | INTRAVENOUS | Status: AC
  Filled 2016-05-10: qty 10

## 2016-05-10 MED ORDER — SODIUM CHLORIDE 0.9 % IR SOLN
Status: DC | PRN
  Administered 2016-05-10: 1000 mL

## 2016-05-10 MED ORDER — DIPHENHYDRAMINE HCL 50 MG/ML IJ SOLN: 12.5000 mg | Freq: Four times a day (QID) | INTRAMUSCULAR | Status: DC | PRN

## 2016-05-10 MED ORDER — CYCLOSPORINE 0.05 % OP EMUL
1.0000 [drp] | Freq: Two times a day (BID) | OPHTHALMIC | Status: DC
  Administered 2016-05-10 – 2016-05-11 (×2): 1 [drp] via OPHTHALMIC
  Filled 2016-05-10 (×2): qty 1

## 2016-05-10 MED ORDER — SUGAMMADEX SODIUM 200 MG/2ML IV SOLN
INTRAVENOUS | Status: AC
  Filled 2016-05-10: qty 2

## 2016-05-10 MED ORDER — HYDROMORPHONE HCL 1 MG/ML IJ SOLN
0.2500 mg | INTRAMUSCULAR | Status: DC | PRN
  Administered 2016-05-10 (×2): 0.5 mg via INTRAVENOUS

## 2016-05-10 MED ORDER — FENTANYL CITRATE (PF) 100 MCG/2ML IJ SOLN
INTRAMUSCULAR | Status: DC | PRN
Start: 2016-05-10 — End: 2016-05-10
  Administered 2016-05-10: 100 ug via INTRAVENOUS
  Administered 2016-05-10: 50 ug via INTRAVENOUS

## 2016-05-10 MED ORDER — ONDANSETRON HCL 4 MG/2ML IJ SOLN
INTRAMUSCULAR | Status: DC | PRN
  Administered 2016-05-10: 4 mg via INTRAVENOUS

## 2016-05-10 MED ORDER — SUGAMMADEX SODIUM 200 MG/2ML IV SOLN
INTRAVENOUS | Status: DC | PRN
  Administered 2016-05-10: 200 mg via INTRAVENOUS

## 2016-05-10 MED ORDER — 0.9 % SODIUM CHLORIDE (POUR BTL) OPTIME
TOPICAL | Status: DC | PRN
  Administered 2016-05-10: 1000 mL

## 2016-05-10 MED ORDER — SCOPOLAMINE 1 MG/3DAYS TD PT72
MEDICATED_PATCH | TRANSDERMAL | Status: DC | PRN
  Administered 2016-05-10: 1 via TRANSDERMAL

## 2016-05-10 MED ORDER — PROPOFOL 10 MG/ML IV BOLUS
INTRAVENOUS | Status: DC | PRN
  Administered 2016-05-10: 140 mg via INTRAVENOUS

## 2016-05-10 MED ORDER — LIFITEGRAST 5 % OP SOLN: 1.0000 [drp] | Freq: Two times a day (BID) | OPHTHALMIC | Status: DC

## 2016-05-10 MED ORDER — MIDAZOLAM HCL 5 MG/5ML IJ SOLN
INTRAMUSCULAR | Status: DC | PRN
  Administered 2016-05-10: 2 mg via INTRAVENOUS

## 2016-05-10 MED ORDER — MIDAZOLAM HCL 2 MG/2ML IJ SOLN
INTRAMUSCULAR | Status: AC
  Filled 2016-05-10: qty 2

## 2016-05-10 MED ORDER — ONDANSETRON HCL 4 MG/2ML IJ SOLN
INTRAMUSCULAR | Status: AC
  Filled 2016-05-10: qty 2

## 2016-05-10 MED ORDER — HYDROCHLOROTHIAZIDE 25 MG PO TABS
25.0000 mg | ORAL_TABLET | Freq: Every day | ORAL | Status: DC
  Administered 2016-05-10 – 2016-05-11 (×2): 25 mg via ORAL
  Filled 2016-05-10 (×2): qty 1

## 2016-05-10 MED ORDER — DARIFENACIN HYDROBROMIDE ER 7.5 MG PO TB24
7.5000 mg | ORAL_TABLET | Freq: Every day | ORAL | Status: DC
Start: 2016-05-11 — End: 2016-05-11
  Administered 2016-05-11: 7.5 mg via ORAL
  Filled 2016-05-10: qty 1

## 2016-05-10 MED ORDER — FAMOTIDINE 20 MG PO TABS
20.0000 mg | ORAL_TABLET | Freq: Every day | ORAL | Status: DC
  Administered 2016-05-10: 20 mg via ORAL
  Filled 2016-05-10: qty 1

## 2016-05-10 MED ORDER — SCOPOLAMINE 1 MG/3DAYS TD PT72
MEDICATED_PATCH | TRANSDERMAL | Status: AC
  Filled 2016-05-10: qty 1

## 2016-05-10 MED ORDER — ONDANSETRON HCL 4 MG/2ML IJ SOLN: 4.0000 mg | Freq: Four times a day (QID) | INTRAMUSCULAR | Status: DC | PRN

## 2016-05-10 MED ORDER — DIPHENHYDRAMINE HCL 12.5 MG/5ML PO ELIX: 12.5000 mg | ORAL_SOLUTION | Freq: Four times a day (QID) | ORAL | Status: DC | PRN

## 2016-05-10 MED ORDER — MONTELUKAST SODIUM 10 MG PO TABS
10.0000 mg | ORAL_TABLET | Freq: Every day | ORAL | Status: DC
  Administered 2016-05-10: 10 mg via ORAL
  Filled 2016-05-10: qty 1

## 2016-05-10 SURGICAL SUPPLY — 47 items
APPLIER CLIP 5 13 M/L LIGAMAX5 (MISCELLANEOUS)
APPLIER CLIP ROT 10 11.4 M/L (STAPLE)
CANISTER SUCTION 2500CC (MISCELLANEOUS) ×2 IMPLANT
CHLORAPREP W/TINT 26ML (MISCELLANEOUS) ×2 IMPLANT
CLIP APPLIE 5 13 M/L LIGAMAX5 (MISCELLANEOUS) IMPLANT
CLIP APPLIE ROT 10 11.4 M/L (STAPLE) IMPLANT
COVER SURGICAL LIGHT HANDLE (MISCELLANEOUS) ×2 IMPLANT
CUTTER FLEX LINEAR 45M (STAPLE) ×2 IMPLANT
DECANTER SPIKE VIAL GLASS SM (MISCELLANEOUS) ×2 IMPLANT
DERMABOND ADVANCED (GAUZE/BANDAGES/DRESSINGS) ×1
DERMABOND ADVANCED .7 DNX12 (GAUZE/BANDAGES/DRESSINGS) ×1 IMPLANT
ELECT REM PT RETURN 9FT ADLT (ELECTROSURGICAL) ×2
ELECTRODE REM PT RTRN 9FT ADLT (ELECTROSURGICAL) ×1 IMPLANT
GAUZE SPONGE 4X4 16PLY XRAY LF (GAUZE/BANDAGES/DRESSINGS) ×2 IMPLANT
GLOVE BIO SURGEON STRL SZ8.5 (GLOVE) ×2 IMPLANT
GLOVE BIOGEL PI IND STRL 6.5 (GLOVE) ×1 IMPLANT
GLOVE BIOGEL PI IND STRL 7.5 (GLOVE) ×1 IMPLANT
GLOVE BIOGEL PI IND STRL 8 (GLOVE) ×2 IMPLANT
GLOVE BIOGEL PI INDICATOR 6.5 (GLOVE) ×1
GLOVE BIOGEL PI INDICATOR 7.5 (GLOVE) ×1
GLOVE BIOGEL PI INDICATOR 8 (GLOVE) ×2
GLOVE SURG SIGNA 7.5 PF LTX (GLOVE) ×2 IMPLANT
GLOVE SURG SS PI 6.0 STRL IVOR (GLOVE) ×2 IMPLANT
GLOVE SURG SS PI 7.5 STRL IVOR (GLOVE) ×2 IMPLANT
GOWN STRL REUS W/ TWL LRG LVL3 (GOWN DISPOSABLE) ×2 IMPLANT
GOWN STRL REUS W/ TWL XL LVL3 (GOWN DISPOSABLE) ×2 IMPLANT
GOWN STRL REUS W/TWL LRG LVL3 (GOWN DISPOSABLE) ×2
GOWN STRL REUS W/TWL XL LVL3 (GOWN DISPOSABLE) ×2
KIT BASIN OR (CUSTOM PROCEDURE TRAY) ×2 IMPLANT
KIT ROOM TURNOVER OR (KITS) ×2 IMPLANT
NS IRRIG 1000ML POUR BTL (IV SOLUTION) ×2 IMPLANT
PAD ARMBOARD 7.5X6 YLW CONV (MISCELLANEOUS) ×2 IMPLANT
POUCH SPECIMEN RETRIEVAL 10MM (ENDOMECHANICALS) ×2 IMPLANT
RELOAD 45 VASCULAR/THIN (ENDOMECHANICALS) IMPLANT
RELOAD STAPLE TA45 3.5 REG BLU (ENDOMECHANICALS) ×2 IMPLANT
SCALPEL HARMONIC ACE (MISCELLANEOUS) ×2 IMPLANT
SCISSORS LAP 5X35 DISP (ENDOMECHANICALS) ×2 IMPLANT
SET IRRIG TUBING LAPAROSCOPIC (IRRIGATION / IRRIGATOR) ×2 IMPLANT
SLEEVE ENDOPATH XCEL 5M (ENDOMECHANICALS) ×2 IMPLANT
SPECIMEN JAR SMALL (MISCELLANEOUS) ×2 IMPLANT
SUT MON AB 4-0 PC3 18 (SUTURE) ×2 IMPLANT
TOWEL OR 17X24 6PK STRL BLUE (TOWEL DISPOSABLE) IMPLANT
TOWEL OR 17X26 10 PK STRL BLUE (TOWEL DISPOSABLE) ×2 IMPLANT
TRAY LAPAROSCOPIC MC (CUSTOM PROCEDURE TRAY) ×2 IMPLANT
TROCAR XCEL BLUNT TIP 100MML (ENDOMECHANICALS) ×2 IMPLANT
TROCAR XCEL NON-BLD 5MMX100MML (ENDOMECHANICALS) ×2 IMPLANT
TUBING INSUFFLATION (TUBING) ×2 IMPLANT

## 2016-05-10 NOTE — Anesthesia Procedure Notes (Signed)
Procedure Name: Intubation Date/Time: 05/10/2016 7:35 AM Performed by: Garrison Columbus T Pre-anesthesia Checklist: Patient identified, Emergency Drugs available, Suction available and Patient being monitored Patient Re-evaluated:Patient Re-evaluated prior to inductionOxygen Delivery Method: Circle System Utilized Preoxygenation: Pre-oxygenation with 100% oxygen Intubation Type: IV induction Ventilation: Mask ventilation without difficulty and Oral airway inserted - appropriate to patient size Laryngoscope Size: Sabra Heck and 2 Grade View: Grade I Tube type: Oral Tube size: 7.0 mm Number of attempts: 1 Airway Equipment and Method: Stylet and Oral airway Placement Confirmation: ETT inserted through vocal cords under direct vision,  positive ETCO2 and breath sounds checked- equal and bilateral Secured at: 22 cm Tube secured with: Tape Dental Injury: Teeth and Oropharynx as per pre-operative assessment

## 2016-05-10 NOTE — Anesthesia Preprocedure Evaluation (Addendum)
Anesthesia Evaluation  Patient identified by MRN, date of birth, ID band Patient awake    Reviewed: Allergy & Precautions, NPO status , Patient's Chart, lab work & pertinent test results  History of Anesthesia Complications (+) PONV and history of anesthetic complications  Airway Mallampati: II  TM Distance: >3 FB Neck ROM: Full    Dental  (+) Dental Advisory Given, Edentulous Upper   Pulmonary neg shortness of breath, neg COPD, neg recent URI, former smoker,    breath sounds clear to auscultation       Cardiovascular hypertension, Pt. on medications + CAD  (-) Past MI  Rhythm:Regular     Neuro/Psych PSYCHIATRIC DISORDERS Anxiety Depression negative neurological ROS     GI/Hepatic Neg liver ROS, GERD  Medicated and Controlled,  Endo/Other  Morbid obesity  Renal/GU negative Renal ROS     Musculoskeletal  (+) Arthritis , Fibromyalgia -  Abdominal   Peds  Hematology   Anesthesia Other Findings   Reproductive/Obstetrics                           Anesthesia Physical Anesthesia Plan  ASA: III  Anesthesia Plan: General   Post-op Pain Management:    Induction: Intravenous  Airway Management Planned: Oral ETT  Additional Equipment: None  Intra-op Plan:   Post-operative Plan: Extubation in OR  Informed Consent: I have reviewed the patients History and Physical, chart, labs and discussed the procedure including the risks, benefits and alternatives for the proposed anesthesia with the patient or authorized representative who has indicated his/her understanding and acceptance.   Dental advisory given  Plan Discussed with: CRNA, Anesthesiologist and Surgeon  Anesthesia Plan Comments:        Anesthesia Quick Evaluation

## 2016-05-10 NOTE — Transfer of Care (Signed)
Immediate Anesthesia Transfer of Care Note  Patient: Shelly [name]  Procedure(s) Performed: Procedure(s): APPENDECTOMY LAPAROSCOPIC (N/A)  Patient Location: PACU  Anesthesia Type:General  Level of Consciousness: awake, alert  and oriented  Airway & Oxygen Therapy: Patient Spontanous Breathing  Post-op Assessment: Report given to RN, Post -op Vital signs reviewed and stable and Patient moving all extremities X 4  Post vital signs: Reviewed and stable  Last Vitals:  Vitals:   05/10/16 0643  BP: 122/81  Pulse: 79  Resp: 20  Temp: 36.5 C    Last Pain:  Vitals:   05/10/16 0643  TempSrc: Oral         Complications: No apparent anesthesia complications

## 2016-05-10 NOTE — Anesthesia Postprocedure Evaluation (Signed)
Anesthesia Post Note  Patient: Shelly [name]  Procedure(s) Performed: Procedure(s) (LRB): APPENDECTOMY LAPAROSCOPIC (N/A)  Patient location during evaluation: PACU Anesthesia Type: General Level of consciousness: awake Pain management: pain level controlled Vital Signs Assessment: post-procedure vital signs reviewed and stable Respiratory status: spontaneous breathing Cardiovascular status: stable Postop Assessment: no signs of nausea or vomiting Anesthetic complications: no    Last Vitals:  Vitals:   05/10/16 0920 05/10/16 0930  BP: 128/60   Pulse: 72 75  Resp: 10 10  Temp:  36.1 C    Last Pain:  Vitals:   05/10/16 0920  TempSrc:   PainSc: Asleep                 Shontelle Muska

## 2016-05-10 NOTE — Interval H&P Note (Signed)
History and Physical Interval Note:no change in H and P  05/10/2016 6:58 AM  Shelly [name]  has presented today for surgery, with the diagnosis of Adenomatous polyp of appendix  The various methods of treatment have been discussed with the patient and family. After consideration of risks, benefits and other options for treatment, the patient has consented to  Procedure(s): APPENDECTOMY LAPAROSCOPIC (N/A) as a surgical intervention .  The patient's history has been reviewed, patient examined, no change in status, stable for surgery.  I have reviewed the patient's chart and labs.  Questions were answered to the patient's satisfaction.     Asad Keeven A

## 2016-05-10 NOTE — Op Note (Addendum)
APPENDECTOMY LAPAROSCOPIC  Procedure Note  Shelly [name] 05/10/2016   Pre-op Diagnosis: Adenomatous polyp of appendix     Post-op Diagnosis: same  Procedure(s): APPENDECTOMY LAPAROSCOPIC  Surgeon(s): Coralie Keens, MD  Anesthesia: General  Staff:  Circulator: Beryle Lathe, RN Scrub Person: Asia J Matier; Adella Hare; Georgeanna Harrison Float Surgical Tech: Adella Hare  Estimated Blood Loss: 10 cc               Specimens: sent to path  Indications: This is a 65 year old female who was found on screening endoscopy to have multiple polyps in her colon. One was at the appendiceal orifice and could only be biopsied and not removed. Because of the location the decision was made to proceed with an extended appendectomy including the tip of the cecum.   Procedure: The patient was brought to the operative room and identified as the correct patient. She was placed supine on the operating table and general anesthesia was induced. Her abdomen was then prepped and draped in the usual sterile fashion. I made a small incision just below the umbilicus. I carried this down to the fascia which was then opened with a scalpel. A hemostat was then used to pass into the peritoneal cavity under direct vision. A 0 Vicryl Purstring suture was placed around the fascia opening. The Port Was Placed through the Opening and Insufflation of the Abdomen Was Begun. I Placed Another 5 Mm Trocar in the Patient's Right Upper Quadrant and Another in the Left Lower Quadrant Both under Direct Vision. The Cecum and Appendix Were Then Identified and Elevated. I Took down the Mesial Appendix with the Harmonic Scalpel. I Then Easily Identified the Base of the Appendix and Cecum. I Transected the base of the appendix including part of the cecum with the laparoscopic GIA stapler. I then placed this in an Endosac and removed it through the incision at the umbilicus. Hemostasis was achieved with the staple line. I  irrigated the abdomen with saline. I removed the ports the umbilicus and tied the Vicryl in place close the fascial defect. Again hemostasis appeared to be achieved. All ports were then removed under direct vision and the abdomen was deflated. All incisions were then anesthetized with Marcaine and closed with 4-0 Monocryl sutures. Skin glue was then applied. The patient tolerated the procedure well. All the counts were correct at the end of the procedure. The patient was then extubated in the operating room and taken in a stable condition to the recovery room.            Geroge Gilliam A   Date: 05/10/2016  Time: 8:09 AM

## 2016-05-11 ENCOUNTER — Encounter (HOSPITAL_COMMUNITY): Payer: Self-pay | Admitting: Surgery

## 2016-05-11 DIAGNOSIS — D120 Benign neoplasm of cecum: Secondary | ICD-10-CM | POA: Diagnosis not present

## 2016-05-11 MED ORDER — TRAMADOL HCL 50 MG PO TABS: 50.0000 mg | ORAL_TABLET | Freq: Four times a day (QID) | ORAL | 0 refills | Status: DC | PRN

## 2016-05-11 NOTE — Discharge Instructions (Signed)
[facility] ______[facility] °LAPAROSCOPIC SURGERY: POST OP INSTRUCTIONS °Always review your discharge instruction sheet given to you by the facility where your surgery was performed. °IF YOU HAVE DISABILITY OR FAMILY LEAVE FORMS, YOU MUST BRING THEM TO THE OFFICE FOR PROCESSING.   °DO NOT GIVE THEM TO YOUR DOCTOR. ° °1. A prescription for pain medication may be given to you upon discharge.  Take your pain medication as prescribed, if needed.  If narcotic pain medicine is not needed, then you may take acetaminophen (Tylenol) or ibuprofen (Advil) as needed. °2. Take your usually prescribed medications unless otherwise directed. °3. If you need a refill on your pain medication, please contact your pharmacy.  They will contact our office to request authorization. Prescriptions will not be filled after 5pm or on week-ends. °4. You should follow a light diet the first few days after arrival home, such as soup and crackers, etc.  Be sure to include lots of fluids daily. °5. Most patients will experience some swelling and bruising in the area of the incisions.  Ice packs will help.  Swelling and bruising can take several days to resolve.  °6. It is common to experience some constipation if taking pain medication after surgery.  Increasing fluid intake and taking a stool softener (such as Colace) will usually help or prevent this problem from occurring.  A mild laxative (Milk of Magnesia or Miralax) should be taken according to package instructions if there are no bowel movements after 48 hours. °7. Unless discharge instructions indicate otherwise, you may remove your bandages 24-48 hours after surgery, and you may shower at that time.  You may have steri-strips (small skin tapes) in place directly over the incision.  These strips should be left on the skin for 7-10 days.  If your surgeon used skin glue on the incision, you may shower in 24 hours.  The glue will flake off over the next 2-3 weeks.  Any sutures or  staples will be removed at the office during your follow-up visit. °8. ACTIVITIES:  You may resume regular (light) daily activities beginning the next day--such as daily self-care, walking, climbing stairs--gradually increasing activities as tolerated.  You may have sexual intercourse when it is comfortable.  Refrain from any heavy lifting or straining until approved by your doctor. °a. You may drive when you are no longer taking prescription pain medication, you can comfortably wear a seatbelt, and you can safely maneuver your car and apply brakes. °b. RETURN TO WORK:  __________________________________________________________ °9. You should see your doctor in the office for a follow-up appointment approximately 2-3 weeks after your surgery.  Make sure that you call for this appointment within a day or two after you arrive home to insure a convenient appointment time. °10. OTHER INSTRUCTIONS: NO LIFTING MORE THAN 15 POUNDS FOR 2 WEEKS __________________________________________________________________________________________________________________________ __________________________________________________________________________________________________________________________ °WHEN TO CALL YOUR DOCTOR: °1. Fever over 101.0 °2. Inability to urinate °3. Continued bleeding from incision. °4. Increased pain, redness, or drainage from the incision. °5. Increasing abdominal pain ° °The clinic staff is available to answer your questions during regular business hours.  Please don’t hesitate to call and ask to speak to one of the nurses for clinical concerns.  If you have a medical emergency, go to the nearest emergency room or call 911.  A surgeon from [facility] is always on call at the hospital. °[address], [address], [place], [place]  [address] ? [address] [address], [place],    [address] °[phone]   [phone] ? FAX [phone] Web site: [email or web address]

## 2016-05-11 NOTE — Discharge Summary (Signed)
Physician Discharge Summary  Patient ID: Shelly [name] MRN: [id number] DOB/AGE: 67-Apr-1952 67 y.o.  Admit date: 05/10/2016 Discharge date: 05/11/2016  Admission Diagnoses:  Discharge Diagnoses:  Active Problems:   Adenomatous colon polyp   Discharged Condition: good  Hospital Course: uneventful post op recovery.  Discharged home POD#1  Consults: None  Significant Diagnostic Studies:   Treatments: surgery: laparoscopic appendectomy  Discharge Exam: Blood pressure (!) 114/53, pulse 72, temperature 97.8 F (36.6 C), temperature source Oral, resp. rate 16, height 5' 6.5" (1.689 m), weight 89.8 kg (198 lb), SpO2 97 %. General appearance: alert, cooperative and no distress Resp: clear to auscultation bilaterally Cardio: regular rate and rhythm, S1, S2 normal, no murmur, click, rub or gallop Incision/Wound:abdomen soft, incision clean  Disposition: 06-Home-Health Care Svc     Medication List    TAKE these medications   acetaminophen 650 MG CR tablet Commonly known as:  TYLENOL Take 1,300 mg by mouth every 8 (eight) hours as needed for pain.   acyclovir cream 5 % Commonly known as:  ZOVIRAX Apply 1 application topically every 4 (four) hours as needed (fever blisters).   amphetamine-dextroamphetamine 25 MG 24 hr capsule Commonly known as:  ADDERALL XR TAKE 1 CAPSULE BY MOUTH IN THE MORNING ONCE A DAY (30 DAYS MAY FILL ON 04/23/2016)   B-12 2500 MCG Tabs Take 2,500 mcg by mouth daily.   buPROPion 300 MG 24 hr tablet Commonly known as:  WELLBUTRIN XL Take 300 mg by mouth daily.   calcium carbonate 750 MG chewable tablet Commonly known as:  TUMS EX Chew 1 tablet by mouth 2 (two) times daily.   CENTRUM SILVER PO Take 1 tablet by mouth daily.   chlorpheniramine 4 MG tablet Commonly known as:  CHLOR-TRIMETON Take 8 mg by mouth at bedtime.   cycloSPORINE 0.05 % ophthalmic emulsion Commonly known as:  RESTASIS Place 1 drop into both eyes 2 (two) times daily.    denosumab 60 MG/ML Soln injection Commonly known as:  PROLIA Inject 60 mg into the skin every 6 (six) months. Administer in upper arm, thigh, or abdomen Last dose was given in August 2017 Next dose due February 2017 Given at Dr Freida Busman Smith's office   EVENING PRIMROSE OIL PO Take 1 tablet by mouth 2 (two) times daily.   famotidine 20 MG tablet Commonly known as:  PEPCID Take 20 mg by mouth at bedtime.   FLUoxetine 20 MG capsule Commonly known as:  PROZAC Take 20 mg by mouth daily.   hydrochlorothiazide 25 MG tablet Commonly known as:  HYDRODIURIL Take 1 tablet (25 mg total) by mouth daily.   MENOPAUTONIC Caps Take 1 capsule by mouth 2 (two) times daily. Supplement for hot flashes   methocarbamol 500 MG tablet Commonly known as:  ROBAXIN Take 1 tablet (500 mg total) by mouth 3 (three) times daily as needed for muscle spasms.   montelukast 10 MG tablet Commonly known as:  SINGULAIR Take 1 tablet (10 mg total) by mouth at bedtime.   omeprazole 40 MG capsule Commonly known as:  PRILOSEC Take 40 mg by mouth every evening.   ondansetron 4 MG tablet Commonly known as:  ZOFRAN Take 1 tablet (4 mg total) by mouth every 8 (eight) hours as needed for nausea or vomiting.   potassium chloride 10 MEQ tablet Commonly known as:  KLOR-CON M10 Take 1 tablet (10 mEq total) by mouth daily.   PROBIOTIC DAILY PO Take 1 capsule by mouth daily.   REMIFEMIN PO Take 1  tablet by mouth 2 (two) times daily.   rosuvastatin 20 MG tablet Commonly known as:  CRESTOR Take 20 mg by mouth every evening.   SM VITAMIN D3 4000 units Caps Generic drug:  Cholecalciferol Take 1 capsule by mouth daily.   solifenacin 5 MG tablet Commonly known as:  VESICARE Take 5 mg by mouth daily.   traMADol 50 MG tablet Commonly known as:  ULTRAM Take 50 mg by mouth every 6 (six) hours as needed for moderate pain. What changed:  Another medication with the same name was added. Make sure you understand how and  when to take each.   traMADol 50 MG tablet Commonly known as:  ULTRAM Take 1-2 tablets (50-100 mg total) by mouth every 6 (six) hours as needed. What changed:  You were already taking a medication with the same name, and this prescription was added. Make sure you understand how and when to take each.   triamcinolone cream 0.1 % Commonly known as:  KENALOG Apply 1 application topically 2 (two) times daily as needed (for itching from prosthetic).   valACYclovir 1000 MG tablet Commonly known as:  VALTREX Take 2,000 mg by mouth 2 (two) times daily as needed (for fever blister). Take 2 tablets every 12 hours at onset of fever blisters.   XIIDRA 5 % Soln Generic drug:  Lifitegrast Place 1 drop into both eyes 2 (two) times daily.      Follow-up Information    Saxton Chain A, MD. Schedule an appointment as soon as possible for a visit in 3 week(s).   Specialty:  General Surgery Contact information: [address] [place] [address] [phone]           Signed: Harl Bowie 05/11/2016, 8:04 AM

## 2016-05-11 NOTE — Progress Notes (Signed)
Patient ID: Shelly [name], female   DOB: Nov 17, 1950, 65 y.o.   MRN: [id number]   Doing very well Wants to go home Tolerating po Ambulating abd soft  Plan: discharge

## 2016-05-11 NOTE — Progress Notes (Signed)
Pt discharged home in stable condition after going over discharge instructions with no concerns voiced. AVS and discharge script given

## 2016-05-18 DIAGNOSIS — M5137 Other intervertebral disc degeneration, lumbosacral region: Secondary | ICD-10-CM | POA: Diagnosis not present

## 2016-05-24 DIAGNOSIS — M5412 Radiculopathy, cervical region: Secondary | ICD-10-CM | POA: Diagnosis not present

## 2016-06-03 DIAGNOSIS — M5137 Other intervertebral disc degeneration, lumbosacral region: Secondary | ICD-10-CM | POA: Diagnosis not present

## 2016-06-03 DIAGNOSIS — N3281 Overactive bladder: Secondary | ICD-10-CM | POA: Diagnosis not present

## 2016-06-03 DIAGNOSIS — Z4789 Encounter for other orthopedic aftercare: Secondary | ICD-10-CM | POA: Diagnosis not present

## 2016-06-24 DIAGNOSIS — M5412 Radiculopathy, cervical region: Secondary | ICD-10-CM | POA: Diagnosis not present

## 2016-06-30 DIAGNOSIS — M7541 Impingement syndrome of right shoulder: Secondary | ICD-10-CM | POA: Diagnosis not present

## 2016-06-30 DIAGNOSIS — M5137 Other intervertebral disc degeneration, lumbosacral region: Secondary | ICD-10-CM | POA: Diagnosis not present

## 2016-06-30 DIAGNOSIS — M47816 Spondylosis without myelopathy or radiculopathy, lumbar region: Secondary | ICD-10-CM | POA: Diagnosis not present

## 2016-07-11 ENCOUNTER — Ambulatory Visit (INDEPENDENT_AMBULATORY_CARE_PROVIDER_SITE_OTHER): Payer: PPO | Admitting: Orthopedic Surgery

## 2016-07-11 ENCOUNTER — Encounter (INDEPENDENT_AMBULATORY_CARE_PROVIDER_SITE_OTHER): Payer: Self-pay | Admitting: Orthopedic Surgery

## 2016-07-11 VITALS — Ht 66.0 in | Wt 198.0 lb

## 2016-07-11 DIAGNOSIS — Z89511 Acquired absence of right leg below knee: Secondary | ICD-10-CM | POA: Diagnosis not present

## 2016-07-11 NOTE — Progress Notes (Signed)
Office Visit Note   Patient: Shelly [name]           Date of Birth: 1950-06-23           MRN: [id number] Visit Date: 07/11/2016              Requested by: Carol Ada, MD [place], [place] [id number] PCP: Reginia Naas, MD  Chief Complaint  Patient presents with  . Right Leg - Follow-up    S/p right BKA 02/17/2012    HPI: Patient is a right BKA amputation 2013 she is here for eval for a new socket. Pt has a callus that is wearing away at here liner and that the socket has been modified as much as it can be and is now worn thin and cracked. Chris from [facility] is here today for eval. Pamella Pert, RMA    Assessment & Plan: Visit Diagnoses:  1. History of right below knee amputation ([place])     Plan: Patient will need a new custom liner was socket her foot and ankle are stable and functioning well. Prescription provided for [employer] for a new components materials sleeve socket.  Follow-Up Instructions: Return if symptoms worsen or fail to improve.   Ortho Exam Examination patient is alert oriented no adenopathy well-dressed normal affect normal respiratory effort she does have an antalgic gait she has a well consolidated residual limb with loss of residual volume. There are no open ulcers no cellulitis no signs of infection.  Imaging: No results found.  Orders:  No orders of the defined types were placed in this encounter.  No orders of the defined types were placed in this encounter.    Procedures: No procedures performed  Clinical Data: No additional findings.  Subjective: Review of Systems  Objective: Vital Signs: Ht _0  (1.676 m)   Wt 198 lb (89.8 kg)   BMI 31.96 kg/m   Specialty Comments:  No specialty comments available.  PMFS History: Patient Active Problem List   Diagnosis Date Noted  . History of right below knee amputation ([place]) 07/11/2016  . Adenomatous colon polyp 05/10/2016  . Coronary artery  calcification 01/07/2016  . Essential hypertension 01/07/2016  . Back pain 09/09/2015  . Abnormal CXR 05/16/2014  . Cough variant asthma 05/15/2014  . Acute post-operative pain 02/18/2012  . Adiposity 02/17/2012  . Does use eyeglasses 02/13/2012  . H/O arthrodesis 10/21/2011  . Fibromyalgia 04/11/2011  . HLD (hyperlipidemia) 04/11/2011  . Leg edema 09/08/2010   Past Medical History:  Diagnosis Date  . Anxiety   . Arthritis   . Avascular necrosis of talus ([place])   . Cancer ([place])    vulva pre cancer had surgery  . Depression   . GERD (gastroesophageal reflux disease)   . Hearing loss    "very minor"  . History of pneumonia   . Hyperlipidemia   . Hypertension   . Leg edema    left  . PONV (postoperative nausea and vomiting)   . Stress incontinence     Family History  Problem Relation Age of Onset  . Dementia Mother 9    alive  . Fibromyalgia Mother   . Allergies Mother   . Heart attack Father 84    deceased  . Multiple myeloma Father   . Other Brother     alive  . Heart disease Brother     emergent CABG for 99% blocked CAD  . Heart murmur Sister 2  she had open heart surgery  . Other Sister 2    She is bIpolar- diabetic  . Breast cancer Sister     Past Surgical History:  Procedure Laterality Date  . ANKLE FUSION  2011   right multiple   . ANKLE FUSION     rear ankle fusion  . ANKLE SURGERY  2010   right cordicompression  . APPENDECTOMY  05/10/2016  . BELOW KNEE LEG AMPUTATION    . LAPAROSCOPIC APPENDECTOMY N/A 05/10/2016   Procedure: APPENDECTOMY LAPAROSCOPIC;  Surgeon: Coralie Keens, MD;  Location: [facility];  Service: General;  Laterality: N/A;  . LUMBAR LAMINECTOMY/DECOMPRESSION MICRODISCECTOMY N/A 09/09/2015   Procedure:  L4-S1 Decompression/ Discetomy;  Surgeon: Melina Schools, MD;  Location: [facility];  Service: Orthopedics;  Laterality: N/A;  . SKIN GRAFT    . TONSILLECTOMY    . TUBAL LIGATION  1983  . VULVECTOMY N/A 01/28/2015   Procedure: WIDE  EXCISION VULVECTOMY;  Surgeon: Thurnell Lose, MD;  Location: [facility];  Service: Gynecology;  Laterality: N/A;   Social History   Occupational History  . [employer]   Social History Main Topics  . Smoking status: Former Smoker    Packs/day: 1.00    Years: 40.00    Types: Cigarettes    Quit date: 02/05/2007  . Smokeless tobacco: Never Used  . Alcohol use 0.0 oz/week     Comment: socially  . Drug use: No  . Sexual activity: Not on file

## 2016-07-22 DIAGNOSIS — M5136 Other intervertebral disc degeneration, lumbar region: Secondary | ICD-10-CM | POA: Diagnosis not present

## 2016-07-22 DIAGNOSIS — M47816 Spondylosis without myelopathy or radiculopathy, lumbar region: Secondary | ICD-10-CM | POA: Diagnosis not present

## 2016-07-25 DIAGNOSIS — K219 Gastro-esophageal reflux disease without esophagitis: Secondary | ICD-10-CM | POA: Diagnosis not present

## 2016-07-25 DIAGNOSIS — R609 Edema, unspecified: Secondary | ICD-10-CM | POA: Diagnosis not present

## 2016-07-25 DIAGNOSIS — N3281 Overactive bladder: Secondary | ICD-10-CM | POA: Diagnosis not present

## 2016-07-25 DIAGNOSIS — F902 Attention-deficit hyperactivity disorder, combined type: Secondary | ICD-10-CM | POA: Diagnosis not present

## 2016-07-25 DIAGNOSIS — Z89511 Acquired absence of right leg below knee: Secondary | ICD-10-CM | POA: Diagnosis not present

## 2016-07-25 DIAGNOSIS — S300XXA Contusion of lower back and pelvis, initial encounter: Secondary | ICD-10-CM | POA: Diagnosis not present

## 2016-07-25 DIAGNOSIS — E785 Hyperlipidemia, unspecified: Secondary | ICD-10-CM | POA: Diagnosis not present

## 2016-07-25 DIAGNOSIS — M5412 Radiculopathy, cervical region: Secondary | ICD-10-CM | POA: Diagnosis not present

## 2016-07-25 DIAGNOSIS — F329 Major depressive disorder, single episode, unspecified: Secondary | ICD-10-CM | POA: Diagnosis not present

## 2016-08-05 DIAGNOSIS — M47816 Spondylosis without myelopathy or radiculopathy, lumbar region: Secondary | ICD-10-CM | POA: Diagnosis not present

## 2016-08-05 DIAGNOSIS — M461 Sacroiliitis, not elsewhere classified: Secondary | ICD-10-CM | POA: Diagnosis not present

## 2016-08-22 DIAGNOSIS — M5412 Radiculopathy, cervical region: Secondary | ICD-10-CM | POA: Diagnosis not present

## 2016-08-24 DIAGNOSIS — Z01812 Encounter for preprocedural laboratory examination: Secondary | ICD-10-CM | POA: Diagnosis not present

## 2016-08-24 DIAGNOSIS — M5410 Radiculopathy, site unspecified: Secondary | ICD-10-CM | POA: Diagnosis not present

## 2016-08-24 DIAGNOSIS — M5442 Lumbago with sciatica, left side: Secondary | ICD-10-CM | POA: Diagnosis not present

## 2016-08-24 DIAGNOSIS — M5137 Other intervertebral disc degeneration, lumbosacral region: Secondary | ICD-10-CM | POA: Diagnosis not present

## 2016-08-24 DIAGNOSIS — M47816 Spondylosis without myelopathy or radiculopathy, lumbar region: Secondary | ICD-10-CM | POA: Diagnosis not present

## 2016-08-25 DIAGNOSIS — M5126 Other intervertebral disc displacement, lumbar region: Secondary | ICD-10-CM | POA: Diagnosis not present

## 2016-08-29 DIAGNOSIS — M810 Age-related osteoporosis without current pathological fracture: Secondary | ICD-10-CM | POA: Diagnosis not present

## 2016-08-31 DIAGNOSIS — M5137 Other intervertebral disc degeneration, lumbosacral region: Secondary | ICD-10-CM | POA: Diagnosis not present

## 2016-09-13 DIAGNOSIS — M533 Sacrococcygeal disorders, not elsewhere classified: Secondary | ICD-10-CM | POA: Diagnosis not present

## 2016-09-13 DIAGNOSIS — M5031 Other cervical disc degeneration,  high cervical region: Secondary | ICD-10-CM | POA: Diagnosis not present

## 2016-09-13 DIAGNOSIS — M47816 Spondylosis without myelopathy or radiculopathy, lumbar region: Secondary | ICD-10-CM | POA: Diagnosis not present

## 2016-09-15 DIAGNOSIS — M47816 Spondylosis without myelopathy or radiculopathy, lumbar region: Secondary | ICD-10-CM | POA: Diagnosis not present

## 2016-09-15 DIAGNOSIS — M791 Myalgia: Secondary | ICD-10-CM | POA: Diagnosis not present

## 2016-09-15 DIAGNOSIS — M961 Postlaminectomy syndrome, not elsewhere classified: Secondary | ICD-10-CM | POA: Diagnosis not present

## 2016-09-15 DIAGNOSIS — M5137 Other intervertebral disc degeneration, lumbosacral region: Secondary | ICD-10-CM | POA: Diagnosis not present

## 2016-09-28 DIAGNOSIS — M5137 Other intervertebral disc degeneration, lumbosacral region: Secondary | ICD-10-CM | POA: Diagnosis not present

## 2016-09-28 DIAGNOSIS — M791 Myalgia: Secondary | ICD-10-CM | POA: Diagnosis not present

## 2016-09-28 DIAGNOSIS — M961 Postlaminectomy syndrome, not elsewhere classified: Secondary | ICD-10-CM | POA: Diagnosis not present

## 2016-09-28 DIAGNOSIS — M47816 Spondylosis without myelopathy or radiculopathy, lumbar region: Secondary | ICD-10-CM | POA: Diagnosis not present

## 2016-10-11 DIAGNOSIS — M47816 Spondylosis without myelopathy or radiculopathy, lumbar region: Secondary | ICD-10-CM | POA: Diagnosis not present

## 2016-10-17 DIAGNOSIS — Z89511 Acquired absence of right leg below knee: Secondary | ICD-10-CM | POA: Diagnosis not present

## 2016-10-19 DIAGNOSIS — J4530 Mild persistent asthma, uncomplicated: Secondary | ICD-10-CM | POA: Diagnosis not present

## 2016-10-19 DIAGNOSIS — F902 Attention-deficit hyperactivity disorder, combined type: Secondary | ICD-10-CM | POA: Diagnosis not present

## 2016-10-19 DIAGNOSIS — R062 Wheezing: Secondary | ICD-10-CM | POA: Diagnosis not present

## 2016-11-15 DIAGNOSIS — R600 Localized edema: Secondary | ICD-10-CM | POA: Diagnosis not present

## 2016-11-30 DIAGNOSIS — J208 Acute bronchitis due to other specified organisms: Secondary | ICD-10-CM | POA: Diagnosis not present

## 2016-12-02 ENCOUNTER — Ambulatory Visit (INDEPENDENT_AMBULATORY_CARE_PROVIDER_SITE_OTHER): Payer: PPO | Admitting: Family

## 2016-12-02 DIAGNOSIS — Z89511 Acquired absence of right leg below knee: Secondary | ICD-10-CM | POA: Diagnosis not present

## 2016-12-02 DIAGNOSIS — S88111S Complete traumatic amputation at level between knee and ankle, right lower leg, sequela: Secondary | ICD-10-CM | POA: Diagnosis not present

## 2016-12-02 DIAGNOSIS — M797 Fibromyalgia: Secondary | ICD-10-CM | POA: Diagnosis not present

## 2016-12-02 MED ORDER — TRAMADOL HCL 50 MG PO TABS: 50.0000 mg | ORAL_TABLET | Freq: Four times a day (QID) | ORAL | 0 refills | Status: DC | PRN

## 2016-12-02 MED ORDER — CEPHALEXIN 500 MG PO CAPS: 500.0000 mg | ORAL_CAPSULE | Freq: Three times a day (TID) | ORAL | 0 refills | Status: DC

## 2016-12-02 NOTE — Progress Notes (Signed)
Office Visit Note   Patient: Shelly [name]           Date of Birth: [birth year]           MRN: [id number] Visit Date: 12/02/2016              Requested by: Carol Ada, MD [place],  [id number] PCP: Carol Ada, MD  No chief complaint on file.     HPI: The patient is 66 year old woman who presents today complaining of pain to her right below the knee amputation. Did get a new prosthetic about a month ago. [employer] is responsible for her prosthetic needs. She states that she had been doing well until this week when she began having pain distally and anteriorly. Complaining of a not anteriorly. States she's had some drainage distally. Of note she is also been having frequent falls. She states she fell 3 times yesterday she feels she has a difficult time figuring out where her prosthetic foot is in space. States that she is unsteady due to her prosthesis.  States that she didn't take an airplane trip about a week ago. Has had onset of symptoms in the last week. Denies any calf pain. No swelling  Assessment & Plan: Visit Diagnoses:  1. Amputation of right lower extremity below knee with complication, sequela ([place])   2. History of right below knee amputation ([place])   3. Fibromyalgia     Plan: Doubt DVT. Provided order for Keflex which she'll take him for cellulitis. She even tramadol as well as for pain. She'll minimize her prosthetic until pain resolves. She'll follow-up in office discussed that she may benefit from physical therapy gait training due to unsteady gait with her prosthetic.  Follow-Up Instructions: Return in about 1 week (around 12/09/2016).   Ortho Exam  Patient is alert, oriented, no adenopathy, well-dressed, normal affect, normal respiratory effort. The right residual limb is well healed well consolidated. No appreciable swelling Distally there is him erythema and warmth. There is no open area and no drainage there is superficial  callus. No palpable abscess. no palpable cords. There is no pain posteriorly no tenderness posteriorly.  Imaging: No results found.  Labs: Lab Results  Component Value Date   ESRSEDRATE 10 09/08/2010    Orders:  No orders of the defined types were placed in this encounter.  Meds ordered this encounter  Medications  . traMADol (ULTRAM) 50 MG tablet    Sig: Take 1 tablet (50 mg total) by mouth every 6 (six) hours as needed for moderate pain.    Dispense:  30 tablet    Refill:  0  . cephALEXin (KEFLEX) 500 MG capsule    Sig: Take 1 capsule (500 mg total) by mouth 3 (three) times daily.    Dispense:  30 capsule    Refill:  0     Procedures: No procedures performed  Clinical Data: No additional findings.  ROS:  All other systems negative, except as noted in the HPI. Review of Systems  Constitutional: Negative for chills and fever.    Objective: Vital Signs: There were no vitals taken for this visit.  Specialty Comments:  No specialty comments available.  PMFS History: Patient Active Problem List   Diagnosis Date Noted  . History of right below knee amputation ([place]) 07/11/2016  . Adenomatous colon polyp 05/10/2016  . Coronary artery calcification 01/07/2016  . Essential hypertension 01/07/2016  . Back pain 09/09/2015  . Abnormal CXR 05/16/2014  .  Cough variant asthma 05/15/2014  . Acute post-operative pain 02/18/2012  . Adiposity 02/17/2012  . Does use eyeglasses 02/13/2012  . H/O arthrodesis 10/21/2011  . Fibromyalgia 04/11/2011  . HLD (hyperlipidemia) 04/11/2011  . Leg edema 09/08/2010   Past Medical History:  Diagnosis Date  . Anxiety   . Arthritis   . Avascular necrosis of talus ([place])   . Cancer ([place])    vulva pre cancer had surgery  . Depression   . GERD (gastroesophageal reflux disease)   . Hearing loss    "very minor"  . History of pneumonia   . Hyperlipidemia   . Hypertension   . Leg edema    left  . PONV (postoperative nausea and  vomiting)   . Stress incontinence     Family History  Problem Relation Age of Onset  . Dementia Mother 4       alive  . Fibromyalgia Mother   . Allergies Mother   . Heart attack Father 85       deceased  . Multiple myeloma Father   . Other Brother        alive  . Heart disease Brother        emergent CABG for 99% blocked CAD  . Heart murmur Sister 39       she had open heart surgery  . Other Sister 50       She is bIpolar- diabetic  . Breast cancer Sister     Past Surgical History:  Procedure Laterality Date  . ANKLE FUSION  2011   right multiple   . ANKLE FUSION     rear ankle fusion  . ANKLE SURGERY  2010   right cordicompression  . APPENDECTOMY  05/10/2016  . BELOW KNEE LEG AMPUTATION    . LAPAROSCOPIC APPENDECTOMY N/A 05/10/2016   Procedure: APPENDECTOMY LAPAROSCOPIC;  Surgeon: Coralie Keens, MD;  Location: [facility];  Service: General;  Laterality: N/A;  . LUMBAR LAMINECTOMY/DECOMPRESSION MICRODISCECTOMY N/A 09/09/2015   Procedure:  L4-S1 Decompression/ Discetomy;  Surgeon: Melina Schools, MD;  Location: [facility];  Service: Orthopedics;  Laterality: N/A;  . SKIN GRAFT    . TONSILLECTOMY    . TUBAL LIGATION  1983  . VULVECTOMY N/A 01/28/2015   Procedure: WIDE EXCISION VULVECTOMY;  Surgeon: Thurnell Lose, MD;  Location: [facility];  Service: Gynecology;  Laterality: N/A;   Social History   Occupational History  . [employer]   Social History Main Topics  . Smoking status: Former Smoker    Packs/day: 1.00    Years: 40.00    Types: Cigarettes    Quit date: 02/05/2007  . Smokeless tobacco: Never Used  . Alcohol use 0.0 oz/week     Comment: socially  . Drug use: No  . Sexual activity: Not on file

## 2016-12-05 ENCOUNTER — Ambulatory Visit (INDEPENDENT_AMBULATORY_CARE_PROVIDER_SITE_OTHER): Payer: PPO | Admitting: Family

## 2016-12-05 ENCOUNTER — Ambulatory Visit (INDEPENDENT_AMBULATORY_CARE_PROVIDER_SITE_OTHER): Payer: PPO

## 2016-12-05 DIAGNOSIS — M79604 Pain in right leg: Secondary | ICD-10-CM

## 2016-12-05 DIAGNOSIS — M79661 Pain in right lower leg: Secondary | ICD-10-CM | POA: Diagnosis not present

## 2016-12-05 DIAGNOSIS — Z89511 Acquired absence of right leg below knee: Secondary | ICD-10-CM

## 2016-12-05 NOTE — Progress Notes (Signed)
Office Visit Note   Patient: Shelly [name]           Date of Birth: 13-Oct-1950           MRN: [id number] Visit Date: 12/05/2016              Requested by: Carol Ada, [place], [place] [address] PCP: Carol Ada, MD  Chief Complaint  Patient presents with  . Right Leg - Pain      HPI: The patient is 66 year old woman who presents today complaining of continued pain to her right residual limb. Fears she has fractured her right lower leg in a fall last week. Has met with her PT who is concerned for a skiers fracture. She has been out of prosthesis since the fall. Has had increased pain and swelling over the weekend.   Is using ibu, tylenol, and tramadol for pain.   Did get a new prosthetic about a month ago. [facility] is responsible for her prosthetic needs. She states that she had been doing well until this week when she began having pain distally and anteriorly. Complaining of a not anteriorly. States she's had some drainage distally. Of note she is also been having frequent falls. She states she fell 3 times yesterday she feels she has a difficult time figuring out where her prosthetic foot is in space. States that she is unsteady due to her prosthesis.   Assessment & Plan: Visit Diagnoses:  1. History of right below knee amputation (HCC)   2. Pain in right lower leg   3. Pain in right leg     Plan: Complete Keflex. May use ice and elevation for swelling. Wear shrinker around the clock. She'll minimize her prosthetic until pain resolves. discussed that she may benefit from physical therapy gait training due to unsteady gait with her prosthetic.  Follow-Up Instructions: Return in about 2 weeks (around 12/19/2016), or if symptoms worsen or fail to improve.   Ortho Exam  Patient is alert, oriented, no adenopathy, well-dressed, normal affect, normal respiratory effort. The right residual limb is well healed well consolidated. Tender laterally  to the soft tissue. Trace edema. Distally there is erythema. No warmth. There is no open area and no drainage there is superficial callus. No palpable abscess. no palpable cords. There is no pain posteriorly no tenderness posteriorly.  Imaging: Xr Tibia/fibula Right  Result Date: 12/05/2016 Radiographs of the right tibia and fibula are negative for fracture. Is status post transtibial amputation.   Labs: Lab Results  Component Value Date   ESRSEDRATE 10 09/08/2010    Orders:  Orders Placed This Encounter  Procedures  . XR Tibia/Fibula Right   No orders of the defined types were placed in this encounter.    Procedures: No procedures performed  Clinical Data: No additional findings.  ROS:  All other systems negative, except as noted in the HPI. Review of Systems  Constitutional: Negative for chills and fever.    Objective: Vital Signs: There were no vitals taken for this visit.  Specialty Comments:  No specialty comments available.  PMFS History: Patient Active Problem List   Diagnosis Date Noted  . History of right below knee amputation ([place]) 07/11/2016  . Adenomatous colon polyp 05/10/2016  . Coronary artery calcification 01/07/2016  . Essential hypertension 01/07/2016  . Back pain 09/09/2015  . Abnormal CXR 05/16/2014  . Cough variant asthma 05/15/2014  . Acute post-operative pain 02/18/2012  . Adiposity 02/17/2012  .  Does use eyeglasses 02/13/2012  . H/O arthrodesis 10/21/2011  . Fibromyalgia 04/11/2011  . HLD (hyperlipidemia) 04/11/2011  . Leg edema 09/08/2010   Past Medical History:  Diagnosis Date  . Anxiety   . Arthritis   . Avascular necrosis of talus ([facility])   . Cancer ([facility])    vulva pre cancer had surgery  . Depression   . GERD (gastroesophageal reflux disease)   . Hearing loss    "very minor"  . History of pneumonia   . Hyperlipidemia   . Hypertension   . Leg edema    left  . PONV (postoperative nausea and vomiting)   . Stress  incontinence     Family History  Problem Relation Age of Onset  . Dementia Mother 96       alive  . Fibromyalgia Mother   . Allergies Mother   . Heart attack Father 62       deceased  . Multiple myeloma Father   . Other Brother        alive  . Heart disease Brother        emergent CABG for 99% blocked CAD  . Heart murmur Sister 58       she had open heart surgery  . Other Sister 4       She is bIpolar- diabetic  . Breast cancer Sister     Past Surgical History:  Procedure Laterality Date  . ANKLE FUSION  2011   right multiple   . ANKLE FUSION     rear ankle fusion  . ANKLE SURGERY  2010   right cordicompression  . APPENDECTOMY  05/10/2016  . BELOW KNEE LEG AMPUTATION    . LAPAROSCOPIC APPENDECTOMY N/A 05/10/2016   Procedure: APPENDECTOMY LAPAROSCOPIC;  Surgeon: Coralie Keens, MD;  Location: [facility];  Service: General;  Laterality: N/A;  . LUMBAR LAMINECTOMY/DECOMPRESSION MICRODISCECTOMY N/A 09/09/2015   Procedure:  L4-S1 Decompression/ Discetomy;  Surgeon: Melina Schools, MD;  Location: [facility];  Service: Orthopedics;  Laterality: N/A;  . SKIN GRAFT    . TONSILLECTOMY    . TUBAL LIGATION  1983  . VULVECTOMY N/A 01/28/2015   Procedure: WIDE EXCISION VULVECTOMY;  Surgeon: Thurnell Lose, MD;  Location: [facility];  Service: Gynecology;  Laterality: N/A;   Social History   Occupational History  . [employer]   Social History Main Topics  . Smoking status: Former Smoker    Packs/day: 1.00    Years: 40.00    Types: Cigarettes    Quit date: 02/05/2007  . Smokeless tobacco: Never Used  . Alcohol use 0.0 oz/week     Comment: socially  . Drug use: No  . Sexual activity: Not on file

## 2016-12-09 ENCOUNTER — Ambulatory Visit (INDEPENDENT_AMBULATORY_CARE_PROVIDER_SITE_OTHER): Payer: PPO | Admitting: Orthopedic Surgery

## 2016-12-14 DIAGNOSIS — M961 Postlaminectomy syndrome, not elsewhere classified: Secondary | ICD-10-CM | POA: Diagnosis not present

## 2016-12-26 ENCOUNTER — Ambulatory Visit (INDEPENDENT_AMBULATORY_CARE_PROVIDER_SITE_OTHER): Payer: PPO | Admitting: Orthopedic Surgery

## 2016-12-26 ENCOUNTER — Encounter (INDEPENDENT_AMBULATORY_CARE_PROVIDER_SITE_OTHER): Payer: Self-pay | Admitting: Orthopedic Surgery

## 2016-12-26 DIAGNOSIS — M79661 Pain in right lower leg: Secondary | ICD-10-CM | POA: Diagnosis not present

## 2016-12-26 DIAGNOSIS — Z89511 Acquired absence of right leg below knee: Secondary | ICD-10-CM | POA: Diagnosis not present

## 2016-12-26 NOTE — Progress Notes (Signed)
Office Visit Note   Patient: Shelly [name]           Date of Birth: [birth year]           MRN: [id number] Visit Date: 12/26/2016              Requested by: Carol Ada, [place], [place] [address] PCP: Carol Ada, MD  Chief Complaint  Patient presents with  . Right Leg - Follow-up      HPI: Patient presents in follow-up status post transtibial amputation of the right. She states that she has fallen several times on the residual limb has had episodes of swelling and she has been working with Shirlean Mylar currently is using a walker to help with her balance and is concerned that she may have some potential problems with the residual limb. Patient did complete a course of Keflex after her most recent fall.  Assessment & Plan: Visit Diagnoses:  1. Pain in right lower leg   2. History of right below knee amputation ([facility])     Plan: Recommended follow-up with [facility] to provide pressure relief from the anterior distal aspect of the tibia. Continue physical therapy with Rob and follow-up as needed  Follow-Up Instructions: Return if symptoms worsen or fail to improve.   Ortho Exam  Patient is alert, oriented, no adenopathy, well-dressed, normal affect, normal respiratory effort. She has an antalgic gait she uses a rolling walker. Examination the residual limb she has no open ulcers no cellulitis no drainage no signs of infection. She has a little bit of redness anteriorly over the distal tibia.  Imaging: No results found.  Labs: Lab Results  Component Value Date   ESRSEDRATE 10 09/08/2010    Orders:  No orders of the defined types were placed in this encounter.  No orders of the defined types were placed in this encounter.    Procedures: No procedures performed  Clinical Data: No additional findings.  ROS:  All other systems negative, except as noted in the HPI. Review of Systems  Objective: Vital Signs: There were no vitals taken  for this visit.  Specialty Comments:  No specialty comments available.  PMFS History: Patient Active Problem List   Diagnosis Date Noted  . History of right below knee amputation ([facility]) 07/11/2016  . Adenomatous colon polyp 05/10/2016  . Coronary artery calcification 01/07/2016  . Essential hypertension 01/07/2016  . Back pain 09/09/2015  . Abnormal CXR 05/16/2014  . Cough variant asthma 05/15/2014  . Acute post-operative pain 02/18/2012  . Adiposity 02/17/2012  . Does use eyeglasses 02/13/2012  . H/O arthrodesis 10/21/2011  . Fibromyalgia 04/11/2011  . HLD (hyperlipidemia) 04/11/2011  . Leg edema 09/08/2010   Past Medical History:  Diagnosis Date  . Anxiety   . Arthritis   . Avascular necrosis of talus ([facility])   . Cancer ([facility])    vulva pre cancer had surgery  . Depression   . GERD (gastroesophageal reflux disease)   . Hearing loss    "very minor"  . History of pneumonia   . Hyperlipidemia   . Hypertension   . Leg edema    left  . PONV (postoperative nausea and vomiting)   . Stress incontinence     Family History  Problem Relation Age of Onset  . Dementia Mother 35       alive  . Fibromyalgia Mother   . Allergies Mother   . Heart attack Father 75  deceased  . Multiple myeloma Father   . Other Brother        alive  . Heart disease Brother        emergent CABG for 99% blocked CAD  . Heart murmur Sister 43       she had open heart surgery  . Other Sister 50       She is bIpolar- diabetic  . Breast cancer Sister     Past Surgical History:  Procedure Laterality Date  . ANKLE FUSION  2011   right multiple   . ANKLE FUSION     rear ankle fusion  . ANKLE SURGERY  2010   right cordicompression  . APPENDECTOMY  05/10/2016  . BELOW KNEE LEG AMPUTATION    . LAPAROSCOPIC APPENDECTOMY N/A 05/10/2016   Procedure: APPENDECTOMY LAPAROSCOPIC;  Surgeon: Coralie Keens, MD;  Location: [facility];  Service: General;  Laterality: N/A;  . LUMBAR  LAMINECTOMY/DECOMPRESSION MICRODISCECTOMY N/A 09/09/2015   Procedure:  L4-S1 Decompression/ Discetomy;  Surgeon: Melina Schools, MD;  Location: [facility];  Service: Orthopedics;  Laterality: N/A;  . SKIN GRAFT    . TONSILLECTOMY    . TUBAL LIGATION  1983  . VULVECTOMY N/A 01/28/2015   Procedure: WIDE EXCISION VULVECTOMY;  Surgeon: Thurnell Lose, MD;  Location: [facility];  Service: Gynecology;  Laterality: N/A;   Social History   Occupational History  . [employer]   Social History Main Topics  . Smoking status: Former Smoker    Packs/day: 1.00    Years: 40.00    Types: Cigarettes    Quit date: 02/05/2007  . Smokeless tobacco: Never Used  . Alcohol use 0.0 oz/week     Comment: socially  . Drug use: No  . Sexual activity: Not on file

## 2017-01-24 DIAGNOSIS — R2689 Other abnormalities of gait and mobility: Secondary | ICD-10-CM | POA: Diagnosis not present

## 2017-01-24 DIAGNOSIS — R413 Other amnesia: Secondary | ICD-10-CM | POA: Diagnosis not present

## 2017-01-24 DIAGNOSIS — R300 Dysuria: Secondary | ICD-10-CM | POA: Diagnosis not present

## 2017-01-24 DIAGNOSIS — E785 Hyperlipidemia, unspecified: Secondary | ICD-10-CM | POA: Diagnosis not present

## 2017-01-24 DIAGNOSIS — F329 Major depressive disorder, single episode, unspecified: Secondary | ICD-10-CM | POA: Diagnosis not present

## 2017-01-24 DIAGNOSIS — F902 Attention-deficit hyperactivity disorder, combined type: Secondary | ICD-10-CM | POA: Diagnosis not present

## 2017-01-24 DIAGNOSIS — M810 Age-related osteoporosis without current pathological fracture: Secondary | ICD-10-CM | POA: Diagnosis not present

## 2017-01-24 DIAGNOSIS — R609 Edema, unspecified: Secondary | ICD-10-CM | POA: Diagnosis not present

## 2017-03-02 DIAGNOSIS — M810 Age-related osteoporosis without current pathological fracture: Secondary | ICD-10-CM | POA: Diagnosis not present

## 2017-03-20 ENCOUNTER — Ambulatory Visit (INDEPENDENT_AMBULATORY_CARE_PROVIDER_SITE_OTHER): Payer: PPO | Admitting: Neurology

## 2017-03-20 ENCOUNTER — Encounter: Payer: Self-pay | Admitting: Neurology

## 2017-03-20 VITALS — BP 120/70 | HR 95 | Ht 67.0 in | Wt 187.0 lb

## 2017-03-20 DIAGNOSIS — G3184 Mild cognitive impairment, so stated: Secondary | ICD-10-CM | POA: Insufficient documentation

## 2017-03-20 DIAGNOSIS — R413 Other amnesia: Secondary | ICD-10-CM | POA: Diagnosis not present

## 2017-03-20 DIAGNOSIS — E538 Deficiency of other specified B group vitamins: Secondary | ICD-10-CM

## 2017-03-20 DIAGNOSIS — R269 Unspecified abnormalities of gait and mobility: Secondary | ICD-10-CM

## 2017-03-20 HISTORY — DX: Other amnesia: R41.3

## 2017-03-20 HISTORY — DX: Unspecified abnormalities of gait and mobility: R26.9

## 2017-03-20 NOTE — Progress Notes (Signed)
Reason for visit: Memory disturbance, gait disturbance  Referring physician: Dr. Lamar Blinks Shelly [name] is a 66 y.o. female  History of present illness:  Ms. Shelly [name] is a 66 year old right-handed white female with a history of ADD and possibly fibromyalgia. The patient gives a two-year history of some issues with word finding, and some mild short-term memory problems. The patient has had difficulty managing at work previously because of her difficulty with concentration and memory. The patient currently is out of work but she is looking for part-time work. The patient has some problems learning new information. She is quite organized, she will make lists, she has no problem keeping up with finances, medications, or appointments. She sometimes has difficulty with directions with driving, but this has not been a big problem for her. She does have some fatigue during the day, she has also noted within the last year that her balance has been altered. The patient has had low back surgery in April 2017 and again in December 2017. She has ongoing problems with back pain and has been seen by Dr. Nelva Bush for this. The patient has had a right below-knee amputation for avascular necrosis of the talus bone. She has some gait instability associated with this as well, but she has to be more careful about walking now. She will oftentimes catch the right prosthetic foot and fall. She does not use a cane or walker for ambulation. She does have some urinary urgency and occasional incontinence. She has noted some slight tingling in the toes of the left foot at times, this does not go up across the ankle. Her mother had a problem with dementia. The patient has occasional episodes of dizziness, she denies headache. She is sent to this office for an evaluation.    Past Medical History:  Diagnosis Date  . Anxiety   . Arthritis   . Avascular necrosis of talus ([facility])   . Cancer ([facility])    vulva pre cancer had surgery  .  Depression   . GERD (gastroesophageal reflux disease)   . Hearing loss    "very minor"  . History of pneumonia   . Hyperlipidemia   . Hypertension   . Leg edema    left  . PONV (postoperative nausea and vomiting)   . Stress incontinence     Past Surgical History:  Procedure Laterality Date  . ANKLE FUSION  2011   right multiple   . ANKLE FUSION     rear ankle fusion  . ANKLE SURGERY  2010   right cordicompression  . APPENDECTOMY  05/10/2016  . BELOW KNEE LEG AMPUTATION    . LAPAROSCOPIC APPENDECTOMY N/A 05/10/2016   Procedure: APPENDECTOMY LAPAROSCOPIC;  Surgeon: Coralie Keens, MD;  Location: [facility];  Service: General;  Laterality: N/A;  . LUMBAR LAMINECTOMY/DECOMPRESSION MICRODISCECTOMY N/A 09/09/2015   Procedure:  L4-S1 Decompression/ Discetomy;  Surgeon: Melina Schools, MD;  Location: [facility];  Service: Orthopedics;  Laterality: N/A;  . SKIN GRAFT    . TONSILLECTOMY    . TUBAL LIGATION  1983  . VULVECTOMY N/A 01/28/2015   Procedure: WIDE EXCISION VULVECTOMY;  Surgeon: Thurnell Lose, MD;  Location: [facility];  Service: Gynecology;  Laterality: N/A;    Family History  Problem Relation Age of Onset  . Dementia Mother 52       alive  . Fibromyalgia Mother   . Allergies Mother   . Heart attack Father 49       deceased  . Multiple  myeloma Father   . Other Brother        alive  . Heart disease Brother        emergent CABG for 99% blocked CAD  . Heart murmur Sister 72       she had open heart surgery  . Other Sister 45       She is bIpolar- diabetic  . Breast cancer Sister     Social history:  reports that she quit smoking about 10 years ago. Her smoking use included Cigarettes. She has a 40.00 pack-year smoking history. She has never used smokeless tobacco. She reports that she drinks alcohol. She reports that she does not use drugs.  Medications:  Prior to Admission medications   Medication Sig Start Date End Date Taking? Authorizing Provider  Black Cohosh (REMIFEMIN  PO) Take 1 tablet by mouth 2 (two) times daily.   Yes [provider]  buPROPion (WELLBUTRIN XL) 300 MG 24 hr tablet Take 300 mg by mouth daily.     Yes [provider]  chlorpheniramine (CHLOR-TRIMETON) 4 MG tablet Take 8 mg by mouth at bedtime.   Yes [provider]  Cholecalciferol (SM VITAMIN D3) 4000 UNITS CAPS Take 5,000 capsules by mouth daily.    Yes [provider]  Cyanocobalamin (B-12) 2500 MCG TABS Take 5,000 mcg by mouth daily.    Yes [provider]  cycloSPORINE (RESTASIS) 0.05 % ophthalmic emulsion Place 1 drop into both eyes 2 (two) times daily.   Yes [provider]  denosumab (PROLIA) 60 MG/ML SOLN injection Inject 60 mg into the skin every 6 (six) months. Administer in upper arm, thigh, or abdomen Last dose was given in August 2017 Next dose due February 2017 Given at Dr Freida Busman [facility] office   Yes [provider]  EVENING PRIMROSE OIL PO Take 1 tablet by mouth 2 (two) times daily.   Yes [provider]  FLUoxetine (PROZAC) 20 MG capsule Take 20 mg by mouth daily.   Yes [provider]  hydrochlorothiazide (HYDRODIURIL) 25 MG tablet Take 1 tablet (25 mg total) by mouth daily. 03/24/16  Yes Isaiah Serge, NP  lisdexamfetamine (VYVANSE) 60 MG capsule Take 60 mg by mouth every morning.   Yes [provider]  Misc Natural Products [facility]) CAPS Take 1 capsule by mouth 2 (two) times daily. Supplement for hot flashes   Yes [provider]  montelukast (SINGULAIR) 10 MG tablet Take 1 tablet (10 mg total) by mouth at bedtime. 06/30/14  Yes Tanda Rockers, MD  Multiple Vitamins-Minerals (CENTRUM SILVER PO) Take 1 tablet by mouth daily.     Yes [provider]  potassium chloride (KLOR-CON M10) 10 MEQ tablet Take 1 tablet (10 mEq total) by mouth daily. 03/24/16  Yes Isaiah Serge, NP  Probiotic Product (PROBIOTIC DAILY PO) Take 1 capsule by mouth daily.   Yes [provider]  rosuvastatin (CRESTOR) 10 MG tablet Take 10 mg by mouth daily.   Yes [provider]      Allergies  Allergen Reactions  . No Known Allergies     ROS:   Out of a complete 14 system review of symptoms, the patient complains only of the following symptoms, and all other reviewed systems are negative.  Memory disturbance Balance problems   Blood pressure 120/70, pulse 95, height _0  (1.702 m), weight 187 lb (84.8 kg).  Physical Exam  General: The patient is alert and cooperative at the time of  the examination.  Eyes: Pupils are equal, round, and reactive to light. Discs are flat bilaterally.  Neck: The neck is supple, no carotid bruits are noted.  Respiratory: The respiratory examination is clear.  Cardiovascular: The cardiovascular examination reveals a regular rate and rhythm, no obvious murmurs or rubs are noted.  Skin: Extremities are without significant edema.The patient has a prosthetic right leg below the knee.  Neurologic Exam  Mental status: The patient is alert and oriented x 3 at the time of the examination. The patient has apparent normal recent and remote memory, with an apparently normal attention span and concentration ability.Mini-Mental status examination done today shows a total score 29/30.  Cranial nerves: Facial symmetry is present. There is good sensation of the face to pinprick and soft touch bilaterally. The strength of the facial muscles and the muscles to head turning and shoulder shrug are normal bilaterally. Speech is well enunciated, no aphasia or dysarthria is noted. Extraocular movements are full. Visual fields are full. The tongue is midline, and the patient has symmetric elevation of the soft palate. No obvious hearing deficits are noted.  Motor: The motor testing reveals 5 over 5 strength of all 4 extremities. Good symmetric motor tone is noted throughout.  Sensory: Sensory testing is intact to pinprick, soft touch,  vibration sensation, and position sense on all 4 extremities. No evidence of extinction is noted.  Coordination: Cerebellar testing reveals good finger-nose-finger and heel-to-shin bilaterally.  Gait and station: Gait is slightly wide-based. Tandem gait was not attempted. Romberg is negative. No drift is seen.  Reflexes: Deep tendon reflexes are symmetric and normal bilaterally, with except that the left ankle jerk reflex was depressed. Toes are downgoing bilaterally.   Assessment/Plan:  1. Memory disturbance   2. Gait disturbance  The gait problem may be multifactorial in nature. The patient has had some worsening of balance since her back surgery. She has a prosthetic right leg below the knee. She has had this since 2013. The patient has also noted some problems with memory over the last 2 years, mainly associated word finding problems. The patient will undergo blood work today, she will have MRI of the brain. She will follow-up in about 6 months. The memory issues will be followed over time. We may consider physical therapy for gait training, she did have some physical therapy after her back surgery a year ago.   Jill Alexanders MD 03/20/2017 9:41 AM  [facility] [address] [place], [place] [address]  Phone [phone] Fax [phone]

## 2017-03-20 NOTE — Patient Instructions (Signed)
   WE will check blood work today and get MRI of the brain.

## 2017-03-21 LAB — HIV ANTIBODY (ROUTINE TESTING W REFLEX): HIV Screen 4th Generation wRfx: NONREACTIVE

## 2017-03-21 LAB — SEDIMENTATION RATE: Sed Rate: 2 mm/hr (ref 0–40)

## 2017-03-21 LAB — RPR: RPR Ser Ql: NONREACTIVE

## 2017-03-21 LAB — VITAMIN B12: Vitamin B-12: >2000 pg/mL — ABNORMAL HIGH (ref 232–1245)

## 2017-03-26 ENCOUNTER — Other Ambulatory Visit: Payer: Self-pay | Admitting: Cardiology

## 2017-03-28 DIAGNOSIS — M7918 Myalgia, other site: Secondary | ICD-10-CM | POA: Diagnosis not present

## 2017-03-28 DIAGNOSIS — M961 Postlaminectomy syndrome, not elsewhere classified: Secondary | ICD-10-CM | POA: Diagnosis not present

## 2017-03-30 ENCOUNTER — Ambulatory Visit
Admission: RE | Admit: 2017-03-30 | Discharge: 2017-03-30 | Disposition: A | Discharge status: Home / Self Care | Payer: PPO | Source: Ambulatory Visit | Attending: Neurology | Admitting: Neurology

## 2017-03-30 DIAGNOSIS — R269 Unspecified abnormalities of gait and mobility: Secondary | ICD-10-CM

## 2017-03-30 DIAGNOSIS — R413 Other amnesia: Secondary | ICD-10-CM

## 2017-03-31 ENCOUNTER — Telehealth: Payer: Self-pay | Admitting: Neurology

## 2017-03-31 NOTE — Telephone Encounter (Signed)
I called the patient. The MRI shows a mild to moderate level SVD, larger lesion in the left frontal WM. The patient has HTN, she is to go on ASA 81 mg a day.  She is going into physical therapy for her back, with water therapy.  She does have some neck pain, we may need to do MRI of the cervical spine in the future if the walking continues to deteriorate.   MRI brain 03/30/17:  IMPRESSION:   MRI brain (without) demonstrating: 1. Mild periventricular and subcortical chronic small vessel ischemic disease.  2. No acute findings.

## 2017-04-04 DIAGNOSIS — E785 Hyperlipidemia, unspecified: Secondary | ICD-10-CM | POA: Diagnosis not present

## 2017-04-04 DIAGNOSIS — Z803 Family history of malignant neoplasm of breast: Secondary | ICD-10-CM | POA: Diagnosis not present

## 2017-04-04 DIAGNOSIS — Z01419 Encounter for gynecological examination (general) (routine) without abnormal findings: Secondary | ICD-10-CM | POA: Diagnosis not present

## 2017-04-04 DIAGNOSIS — Z9189 Other specified personal risk factors, not elsewhere classified: Secondary | ICD-10-CM | POA: Diagnosis not present

## 2017-04-07 ENCOUNTER — Encounter: Payer: Self-pay | Admitting: Interventional Cardiology

## 2017-04-07 ENCOUNTER — Encounter: Payer: Self-pay | Admitting: Neurology

## 2017-04-07 ENCOUNTER — Other Ambulatory Visit: Payer: Self-pay | Admitting: Obstetrics and Gynecology

## 2017-04-07 DIAGNOSIS — Z1231 Encounter for screening mammogram for malignant neoplasm of breast: Secondary | ICD-10-CM

## 2017-04-10 ENCOUNTER — Encounter (INDEPENDENT_AMBULATORY_CARE_PROVIDER_SITE_OTHER): Payer: Self-pay | Admitting: Orthopedic Surgery

## 2017-04-10 ENCOUNTER — Ambulatory Visit (INDEPENDENT_AMBULATORY_CARE_PROVIDER_SITE_OTHER): Payer: PPO | Admitting: Orthopedic Surgery

## 2017-04-10 ENCOUNTER — Ambulatory Visit (INDEPENDENT_AMBULATORY_CARE_PROVIDER_SITE_OTHER): Payer: PPO

## 2017-04-10 DIAGNOSIS — M25562 Pain in left knee: Secondary | ICD-10-CM | POA: Diagnosis not present

## 2017-04-10 DIAGNOSIS — M25522 Pain in left elbow: Secondary | ICD-10-CM

## 2017-04-10 NOTE — Progress Notes (Signed)
Office Visit Note   Patient: Shelly [name]           Date of Birth: [birth year]           MRN: [id number] Visit Date: 04/10/2017              Requested by: Carol Ada, [place], [place] [address] PCP: Carol Ada, MD  Chief Complaint  Patient presents with  . Left Knee - Injury, Pain    From fall in July 2018  . Left Elbow - Pain, Injury    From Fall in July 2018  . Left Foot - Pain      HPI: Patient is a 66 year old woman who states that she was at work when she tripped over an area rug.  She states she landed on her left forearm and left knee striking a glass door.  Patient states she was out of work for about a month and she still has sharp pain over the subcutaneous border of the proximal olecranon as well as pain over the lateral left knee.  Assessment & Plan: Visit Diagnoses:  1. Left knee pain, unspecified chronicity   2. Pain in left elbow     Plan: Do not feel any intervention is necessary at this time.  Feel that both areas should resolve with time follow-up as needed.  Follow-Up Instructions: Return if symptoms worsen or fail to improve.   Ortho Exam  Patient is alert, oriented, no adenopathy, well-dressed, normal affect, normal respiratory effort. Patient patient's left elbow she has full flexion extension supination and pronation.  The medial and lateral epicondyles are nontender to palpation the radial capitellar joint is nontender to palpation or with range of motion.  Patient has no pain to palpation over the olecranon she has a little bit of tenderness over the subcutaneous border of the proximal ulna.  There is no ecchymosis no bruising no swelling.  Examination of the left knee there is no effusion she has full range of motion.  Collaterals and cruciates are stable.  She does have some tenderness to palpation over the lateral collateral ligament this is stable with stressing.  The medial lateral joint line and  patellofemoral joint are nontender to palpation.  Imaging: Xr Elbow 2 Views Left  Result Date: 04/10/2017 2 view radiographs of the left elbow shows no effusion no bony abnormalities no evidence of fracture.  Xr Knee 3 View Left  Result Date: 04/10/2017 2 view radiographs of the left knee shows no fracture no subcondylar cysts or sclerosis.  There is some mild medial joint line narrowing.  No images are attached to the encounter.  Labs: Lab Results  Component Value Date   ESRSEDRATE 2 03/20/2017   ESRSEDRATE 10 09/08/2010    Orders:  Orders Placed This Encounter  Procedures  . XR KNEE 3 VIEW LEFT  . XR Elbow 2 Views Left   No orders of the defined types were placed in this encounter.    Procedures: No procedures performed  Clinical Data: No additional findings.  ROS:  All other systems negative, except as noted in the HPI. Review of Systems  Objective: Vital Signs: There were no vitals taken for this visit.  Specialty Comments:  No specialty comments available.  PMFS History: Patient Active Problem List   Diagnosis Date Noted  . Memory difficulty 03/20/2017  . Gait abnormality 03/20/2017  . History of right below knee amputation ([place]) 07/11/2016  . Adenomatous colon polyp 05/10/2016  .  Coronary artery calcification 01/07/2016  . Essential hypertension 01/07/2016  . Back pain 09/09/2015  . Abnormal CXR 05/16/2014  . Cough variant asthma 05/15/2014  . Acute post-operative pain 02/18/2012  . Adiposity 02/17/2012  . Does use eyeglasses 02/13/2012  . H/O arthrodesis 10/21/2011  . Fibromyalgia 04/11/2011  . HLD (hyperlipidemia) 04/11/2011  . Leg edema 09/08/2010   Past Medical History:  Diagnosis Date  . Anxiety   . Arthritis   . Avascular necrosis of talus ([place])   . Cancer ([place])    vulva pre cancer had surgery  . Depression   . Gait abnormality 03/20/2017  . GERD (gastroesophageal reflux disease)   . Hearing loss    "very minor"  . History of  pneumonia   . Hyperlipidemia   . Hypertension   . Leg edema    left  . Memory difficulty 03/20/2017  . PONV (postoperative nausea and vomiting)   . Stress incontinence     Family History  Problem Relation Age of Onset  . Dementia Mother 65       alive  . Fibromyalgia Mother   . Allergies Mother   . Heart attack Father 79       deceased  . Multiple myeloma Father   . Other Brother        alive  . Heart disease Brother        emergent CABG for 99% blocked CAD  . Heart murmur Sister 9       she had open heart surgery  . Other Sister 34       She is bIpolar- diabetic  . Breast cancer Sister     Past Surgical History:  Procedure Laterality Date  . ANKLE FUSION  2011   right multiple   . ANKLE FUSION     rear ankle fusion  . ANKLE SURGERY  2010   right cordicompression  . APPENDECTOMY  05/10/2016  . BELOW KNEE LEG AMPUTATION    . SKIN GRAFT    . TONSILLECTOMY    . TUBAL LIGATION  1983   Social History   Occupational History  . Occupation: ACCOUNTING    Employer: [employer]  Tobacco Use  . Smoking status: Former Smoker    Packs/day: 1.00    Years: 40.00    Pack years: 40.00    Types: Cigarettes    Last attempt to quit: 02/05/2007    Years since quitting: 10.1  . Smokeless tobacco: Never Used  Substance and Sexual Activity  . Alcohol use: Yes    Alcohol/week: 0.0 oz    Comment: socially  . Drug use: No  . Sexual activity: Not on file

## 2017-04-18 DIAGNOSIS — M961 Postlaminectomy syndrome, not elsewhere classified: Secondary | ICD-10-CM | POA: Diagnosis not present

## 2017-04-19 DIAGNOSIS — M542 Cervicalgia: Secondary | ICD-10-CM | POA: Diagnosis not present

## 2017-04-19 DIAGNOSIS — M79601 Pain in right arm: Secondary | ICD-10-CM | POA: Diagnosis not present

## 2017-04-19 DIAGNOSIS — M546 Pain in thoracic spine: Secondary | ICD-10-CM | POA: Diagnosis not present

## 2017-04-19 DIAGNOSIS — M545 Low back pain: Secondary | ICD-10-CM | POA: Diagnosis not present

## 2017-04-19 DIAGNOSIS — M79602 Pain in left arm: Secondary | ICD-10-CM | POA: Diagnosis not present

## 2017-04-20 ENCOUNTER — Encounter: Payer: Self-pay | Admitting: Interventional Cardiology

## 2017-04-20 ENCOUNTER — Ambulatory Visit: Payer: PPO | Admitting: Interventional Cardiology

## 2017-04-20 VITALS — BP 122/72 | HR 93 | Ht 67.0 in | Wt 188.2 lb

## 2017-04-20 DIAGNOSIS — I2584 Coronary atherosclerosis due to calcified coronary lesion: Secondary | ICD-10-CM

## 2017-04-20 DIAGNOSIS — E785 Hyperlipidemia, unspecified: Secondary | ICD-10-CM | POA: Diagnosis not present

## 2017-04-20 DIAGNOSIS — I2510 Atherosclerotic heart disease of native coronary artery without angina pectoris: Secondary | ICD-10-CM | POA: Diagnosis not present

## 2017-04-20 DIAGNOSIS — I10 Essential (primary) hypertension: Secondary | ICD-10-CM | POA: Diagnosis not present

## 2017-04-20 DIAGNOSIS — R600 Localized edema: Secondary | ICD-10-CM

## 2017-04-20 NOTE — Patient Instructions (Signed)

## 2017-04-20 NOTE — Progress Notes (Signed)
Cardiology Office Note   Date:  04/20/2017   ID:  Shelly, [name] 03/13/1951, MRN [id number]  PCP:  Carol Ada, MD    No chief complaint on file. CAD by CT scan, coronary calcium   Wt Readings from Last 3 Encounters:  04/20/17 188 lb 3.2 oz (85.4 kg)  03/20/17 187 lb (84.8 kg)  07/11/16 198 lb (89.8 kg)       History of Present Illness: Shelly [name] is a 66 y.o. female  Who had coronary calcium noted in 2015.  She was asymptomatic and then had a stress test that was negative.  She has RF for CAD including high cholesterol.  Her brother had CABG in he past few years.   SHe had back surgery in 2017 and there were no cardiac issues at that time.    Denies : Chest pain. Dizziness.  Nitroglycerin use. Orthopnea. Palpitations. Paroxysmal nocturnal dyspnea. Shortness of breath. Syncope.   Right leg BKA.  Left leg swelling- chronic.      Past Medical History:  Diagnosis Date  . Anxiety   . Arthritis   . Avascular necrosis of talus ([place])   . Cancer ([place])    vulva pre cancer had surgery  . Depression   . Gait abnormality 03/20/2017  . GERD (gastroesophageal reflux disease)   . Hearing loss    "very minor"  . History of pneumonia   . Hyperlipidemia   . Hypertension   . Leg edema    left  . Memory difficulty 03/20/2017  . PONV (postoperative nausea and vomiting)   . Stress incontinence     Past Surgical History:  Procedure Laterality Date  . ANKLE FUSION  2011   right multiple   . ANKLE FUSION     rear ankle fusion  . ANKLE SURGERY  2010   right cordicompression  . APPENDECTOMY  05/10/2016  . BELOW KNEE LEG AMPUTATION    . LAPAROSCOPIC APPENDECTOMY N/A 05/10/2016   Procedure: APPENDECTOMY LAPAROSCOPIC;  Surgeon: Coralie Keens, MD;  Location: [facility];  Service: General;  Laterality: N/A;  . LUMBAR LAMINECTOMY/DECOMPRESSION MICRODISCECTOMY N/A 09/09/2015   Procedure:  L4-S1 Decompression/ Discetomy;  Surgeon: Melina Schools, MD;  Location: [facility];   Service: Orthopedics;  Laterality: N/A;  . SKIN GRAFT    . TONSILLECTOMY    . TUBAL LIGATION  1983  . VULVECTOMY N/A 01/28/2015   Procedure: WIDE EXCISION VULVECTOMY;  Surgeon: Thurnell Lose, MD;  Location: [facility];  Service: Gynecology;  Laterality: N/A;     Current Outpatient Medications  Medication Sig Dispense Refill  . acetaminophen (TYLENOL) 650 MG CR tablet Take 650 mg every 8 (eight) hours as needed by mouth for pain.    Marland Kitchen aspirin 81 MG tablet Take 81 mg by mouth daily.    Marland Kitchen buPROPion (WELLBUTRIN XL) 300 MG 24 hr tablet Take 300 mg by mouth daily.      . chlorpheniramine (CHLOR-TRIMETON) 4 MG tablet Take 8 mg by mouth at bedtime.    . Cholecalciferol (SM VITAMIN D3) 4000 UNITS CAPS Take 5,000 capsules by mouth daily.     . Cyanocobalamin (B-12) 2500 MCG TABS Take 5,000 mcg by mouth daily.     . cycloSPORINE (RESTASIS) 0.05 % ophthalmic emulsion Place 1 drop into both eyes 2 (two) times daily.    Marland Kitchen denosumab (PROLIA) 60 MG/ML SOLN injection Inject 60 mg into the skin every 6 (six) months. Administer in upper arm, thigh, or abdomen Last dose was given  in August 2017 Next dose due February 2017 Given at Dr Freida Busman [facility] office    . Diphenhyd-Hydrocort-Nystatin (FIRST-DUKES MOUTHWASH MT) One capful as needed  1  . EVENING PRIMROSE OIL PO Take 1 tablet by mouth 2 (two) times daily.    Marland Kitchen FLUoxetine (PROZAC) 20 MG capsule Take 20 mg by mouth daily.    . hydrochlorothiazide (HYDRODIURIL) 25 MG tablet Take 1 tablet (25 mg total) by mouth daily. 90 tablet 3  . KLOR-CON M10 10 MEQ tablet TAKE 1 TABLET BY MOUTH EVERY DAY 90 tablet 0  . lisdexamfetamine (VYVANSE) 60 MG capsule Take 60 mg by mouth every morning.    . magnesium gluconate (MAGONATE) 500 MG tablet Take 1 tablet daily by mouth.    . montelukast (SINGULAIR) 10 MG tablet Take 1 tablet (10 mg total) by mouth at bedtime. 30 tablet 11  . Multiple Vitamins-Minerals (CENTRUM SILVER PO) Take 1 tablet by mouth daily.      . naproxen  sodium (ALEVE) 220 MG tablet Take 220 mg as needed by mouth.    . rosuvastatin (CRESTOR) 10 MG tablet Take 10 mg by mouth daily.    . traMADol (ULTRAM) 50 MG tablet Take 50 mg as needed by mouth.  0  . valACYclovir (VALTREX) 1000 MG tablet Take 1,000 mg as needed by mouth.     No current facility-administered medications for this visit.     Allergies:   No known allergies    Social History:  The patient  reports that she quit smoking about 10 years ago. Her smoking use included cigarettes. She has a 40.00 pack-year smoking history. she has never used smokeless tobacco. She reports that she drinks alcohol. She reports that she does not use drugs.   Family History:  The patient's family history includes Allergies in her mother; Breast cancer in her sister; Dementia (age of onset: 52) in her mother; Fibromyalgia in her mother; Heart attack (age of onset: 32) in her father; Heart disease in her brother; Heart murmur (age of onset: 42) in her sister; Multiple myeloma in her father; Other in her brother; Other (age of onset: 66) in her sister.    ROS:  Please see the history of present illness.   Otherwise, review of systems are positive for left leg swelling, chronic; memory issues- has seen neurology.   All other systems are reviewed and negative.    PHYSICAL EXAM: VS:  BP 122/72   Pulse 93   Ht '5\' 7"'$  (1.702 m)   Wt 188 lb 3.2 oz (85.4 kg)   SpO2 97%   BMI 29.48 kg/m  , BMI Body mass index is 29.48 kg/m. GEN: Well nourished, well developed, in no acute distress  HEENT: normal  Neck: no JVD, carotid bruits, or masses Cardiac: RRR; no murmurs, rubs, or gallops,; left leg edema 1+ Respiratory:  clear to auscultation bilaterally, normal work of breathing GI: soft, nontender, nondistended, + BS MS: no deformity or atrophy  Skin: warm and dry, no rash Neuro:  Strength and sensation are intact Psych: euthymic mood, full affect   EKG:   The ekg ordered today demonstrates NSR, prolonged  PR interval, no ST segment changes   Recent Labs: 05/09/2016: BUN 13; Creatinine, Ser 0.78; Hemoglobin 13.3; Platelets 263; Potassium 4.0; Sodium 140   Lipid Panel No results found for: CHOL, TRIG, HDL, CHOLHDL, VLDL, LDLCALC, LDLDIRECT   Other studies Reviewed: Additional studies/ records that were reviewed today with results demonstrating: LDL 73, HDl 55 TG 87  on 04/04/17.   ASSESSMENT AND PLAN:  1. Coronary calcium: No CAD sx.  Brother with CABG.  Continue preventive therapy. 2. Hyperlipidemia: LDLwell controlled. Continue Crestor.  3. HTN: COntrolled.  Continue HCTZ. 4. Elevate left leg for edema. Right BKA. 5. Encouraged regular exercise.   Current medicines are reviewed at length with the patient today.  The patient concerns regarding her medicines were addressed.  The following changes have been made:  No change  Labs/ tests ordered today include:  No orders of the defined types were placed in this encounter.   Recommend 150 minutes/week of aerobic exercise Low fat, low carb, high fiber diet recommended  Disposition:   FU in 1 year   Signed, Larae Grooms, MD  04/20/2017 3:48 PM    [facility] [place], [place], [place]  [address] Phone: [phone]; Fax: [phone]

## 2017-05-02 DIAGNOSIS — Z23 Encounter for immunization: Secondary | ICD-10-CM | POA: Diagnosis not present

## 2017-05-03 ENCOUNTER — Other Ambulatory Visit: Payer: Self-pay | Admitting: Podiatry

## 2017-05-03 ENCOUNTER — Ambulatory Visit (INDEPENDENT_AMBULATORY_CARE_PROVIDER_SITE_OTHER): Payer: PPO

## 2017-05-03 ENCOUNTER — Ambulatory Visit: Payer: PPO

## 2017-05-03 ENCOUNTER — Encounter: Payer: Self-pay | Admitting: Podiatry

## 2017-05-03 ENCOUNTER — Ambulatory Visit: Payer: PPO | Admitting: Podiatry

## 2017-05-03 DIAGNOSIS — M76822 Posterior tibial tendinitis, left leg: Secondary | ICD-10-CM

## 2017-05-03 DIAGNOSIS — M79672 Pain in left foot: Secondary | ICD-10-CM | POA: Diagnosis not present

## 2017-05-03 DIAGNOSIS — M79671 Pain in right foot: Secondary | ICD-10-CM

## 2017-05-03 DIAGNOSIS — M779 Enthesopathy, unspecified: Secondary | ICD-10-CM

## 2017-05-03 DIAGNOSIS — M25572 Pain in left ankle and joints of left foot: Secondary | ICD-10-CM

## 2017-05-03 DIAGNOSIS — M25522 Pain in left elbow: Secondary | ICD-10-CM | POA: Diagnosis not present

## 2017-05-03 DIAGNOSIS — L600 Ingrowing nail: Secondary | ICD-10-CM

## 2017-05-03 DIAGNOSIS — M25562 Pain in left knee: Secondary | ICD-10-CM | POA: Diagnosis not present

## 2017-05-03 MED ORDER — TRIAMCINOLONE ACETONIDE 10 MG/ML IJ SUSP
10.0000 mg | Freq: Once | INTRAMUSCULAR | Status: AC
  Administered 2017-05-03: 10 mg

## 2017-05-03 NOTE — Progress Notes (Signed)
Subjective:    Patient ID: Shelly [name], female   DOB: 65 y.o.   MRN: [id number]   HPI patient presents stating she's had a lot of pain in her left ankle and also has nail ingrown toenail the left big toe and has had a leg amputation right secondary to avascular necrosis of the talus right. Patient does not smoke like to be active    Review of Systems  All other systems reviewed and are negative.       Objective:  Physical Exam  Constitutional: She appears well-developed and well-nourished.  Cardiovascular: Intact distal pulses.  Pulmonary/Chest: Effort normal.  Musculoskeletal: Normal range of motion.  Neurological: She is alert.  Skin: Skin is warm.  Nursing note and vitals reviewed.  neurovascular status intact muscle strength adequate range of motion within normal limits with patient noted to have discomfort in the lateral ankle gutter in the sinus tarsi with incurvated nailbed left hallux medial that's painful when pressed. Patient does have good digital perfusion left and no other pathology was noted. Also noted significant depression of the arch consistent with posterior tibial dysfunction     Assessment:    Inflammatory capsulitis sinus tarsi left with mild irritation of the peroneal tendon complex with ingrown toenail deformity left. Posterior tibial dysfunction noted     Plan:    H&P conditions reviewed x-rays reviewed. I went ahead today and I injected the sinus tarsi left 3 mg Kenalog and applied fascial brace with posterior tibial tendon dysfunction left noted. Patient will have ingrown toenail fixed in future  X-rays indicated depression of the arch with indications of collapse medial longitudinal arch left

## 2017-05-03 NOTE — Patient Instructions (Signed)

## 2017-05-05 ENCOUNTER — Ambulatory Visit
Admission: RE | Admit: 2017-05-05 | Discharge: 2017-05-05 | Disposition: A | Discharge status: Home / Self Care | Payer: PPO | Source: Ambulatory Visit | Attending: Obstetrics and Gynecology | Admitting: Obstetrics and Gynecology

## 2017-05-05 DIAGNOSIS — Z1231 Encounter for screening mammogram for malignant neoplasm of breast: Secondary | ICD-10-CM

## 2017-05-17 ENCOUNTER — Encounter: Payer: Self-pay | Admitting: Podiatry

## 2017-05-17 ENCOUNTER — Ambulatory Visit (INDEPENDENT_AMBULATORY_CARE_PROVIDER_SITE_OTHER): Payer: PPO | Admitting: Podiatry

## 2017-05-17 DIAGNOSIS — M76822 Posterior tibial tendinitis, left leg: Secondary | ICD-10-CM

## 2017-05-17 DIAGNOSIS — L600 Ingrowing nail: Secondary | ICD-10-CM

## 2017-05-17 NOTE — Patient Instructions (Signed)

## 2017-05-18 NOTE — Progress Notes (Signed)
Subjective:   Patient ID: Shelly [name], female   DOB: 66 y.o.   MRN: [id number]   HPI Patient states the posterior tibial tendon is improved from previous treatment as is the ankle joint and she does have an ingrown toenail of the left big toe which is been sore she cannot cut and she would like fixed permanently.   ROS      Objective:  Physical Exam  Neurovascular status left intact with patient found to have incurvated medial border with extreme reduction of discomfort in the left ankle and sinus tarsi.     Assessment:  Ingrown toenail deformity left hallux with capsulitis left sinus tarsi subtalar joint which is doing much better.     Plan:  H&P condition reviewed and advised on stretching exercises for the ankle and I would recommend correction of the ingrown toenail explained procedure and risk.  Patient wants surgery today I infiltrated the left hallux 60 mg like Marcaine mixture removed medial border exposed matrix and applied phenol 3 applications 30 seconds followed by alcohol lavage and sterile dressing.  Given instructions on soaks and reappoint

## 2017-06-02 DIAGNOSIS — M25562 Pain in left knee: Secondary | ICD-10-CM | POA: Diagnosis not present

## 2017-06-02 DIAGNOSIS — M25522 Pain in left elbow: Secondary | ICD-10-CM | POA: Diagnosis not present

## 2017-08-30 ENCOUNTER — Other Ambulatory Visit: Payer: Self-pay | Admitting: Cardiology

## 2017-09-18 ENCOUNTER — Encounter: Payer: Self-pay | Admitting: Neurology

## 2017-09-18 ENCOUNTER — Ambulatory Visit (INDEPENDENT_AMBULATORY_CARE_PROVIDER_SITE_OTHER): Payer: Medicare Other | Admitting: Neurology

## 2017-09-18 VITALS — BP 135/82 | HR 100 | Ht 67.0 in | Wt 185.0 lb

## 2017-09-18 DIAGNOSIS — R413 Other amnesia: Secondary | ICD-10-CM | POA: Diagnosis not present

## 2017-09-18 DIAGNOSIS — Z5181 Encounter for therapeutic drug level monitoring: Secondary | ICD-10-CM

## 2017-09-18 DIAGNOSIS — R269 Unspecified abnormalities of gait and mobility: Secondary | ICD-10-CM | POA: Diagnosis not present

## 2017-09-18 NOTE — Progress Notes (Signed)
Reason for visit: Memory disorder, gait disorder  Shelly [name] is an 67 y.o. female  History of present illness:  Shelly [name] is a 67 year old right-handed white female with a history of some problems with a mild memory disturbance who comes into the office today for an evaluation.  MRI of the brain done previously shows mild to moderate chronic small vessel ischemic changes, the patient is on low-dose aspirin and she is controlling her blood pressure.  She does have a mild gait disorder, but she has undergone physical therapy with good improvement.  The patient has a right below-knee amputation with a prosthetic leg, she does indicate that there was a mild malfunction of the prosthetic limb which was causing her to fall, this has been corrected and she has done better.  She still has some mild gait instability if she is on uneven ground.  She has had improvement in strength of her extremities with physical therapy.  She does have some neck pain and low back pain, she has undergone an epidural injection of the low back and neck, she is followed by Dr. Nelva Bush and Dr. Rolena Infante.  MRI of the cervical spine was done on 28 July 2017 and shows anterior listhesis of the C4-5 level and severe right foraminal stenosis at the C4-5 level.  Severe right foraminal stenosis is noted at the C3-4 level and possible impingement of the C6 level nerve roots and right severe foraminal stenosis is noted at the C2-3 level.  The patient has been able to sleep better with physical therapy and the epidural steroid injection.  She returns to this office for an evaluation.  Past Medical History:  Diagnosis Date  . Anxiety   . Arthritis   . Avascular necrosis of talus ([place])   . Cancer ([place])    vulva pre cancer had surgery  . Depression   . Gait abnormality 03/20/2017  . GERD (gastroesophageal reflux disease)   . Hearing loss    "very minor"  . History of pneumonia   . Hyperlipidemia   . Hypertension   . Leg  edema    left  . Memory difficulty 03/20/2017  . PONV (postoperative nausea and vomiting)   . Stress incontinence     Past Surgical History:  Procedure Laterality Date  . ANKLE FUSION  2011   right multiple   . ANKLE FUSION     rear ankle fusion  . ANKLE SURGERY  2010   right cordicompression  . APPENDECTOMY  05/10/2016  . BELOW KNEE LEG AMPUTATION    . LAPAROSCOPIC APPENDECTOMY N/A 05/10/2016   Procedure: APPENDECTOMY LAPAROSCOPIC;  Surgeon: Coralie Keens, MD;  Location: [facility];  Service: General;  Laterality: N/A;  . LUMBAR LAMINECTOMY/DECOMPRESSION MICRODISCECTOMY N/A 09/09/2015   Procedure:  L4-S1 Decompression/ Discetomy;  Surgeon: Melina Schools, MD;  Location: [facility];  Service: Orthopedics;  Laterality: N/A;  . SKIN GRAFT    . TONSILLECTOMY    . TUBAL LIGATION  1983  . VULVECTOMY N/A 01/28/2015   Procedure: WIDE EXCISION VULVECTOMY;  Surgeon: Thurnell Lose, MD;  Location: [facility];  Service: Gynecology;  Laterality: N/A;    Family History  Problem Relation Age of Onset  . Dementia Mother 69       alive  . Fibromyalgia Mother   . Allergies Mother   . Heart attack Father 26       deceased  . Multiple myeloma Father   . Other Brother  alive  . Heart disease Brother        emergent CABG for 99% blocked CAD  . Heart murmur Sister 38       she had open heart surgery  . Other Sister 52       She is bIpolar- diabetic  . Breast cancer Sister     Social history:  reports that she quit smoking about 10 years ago. Her smoking use included cigarettes. She has a 40.00 pack-year smoking history. She has never used smokeless tobacco. She reports that she drinks alcohol. She reports that she does not use drugs.    Allergies  Allergen Reactions  . No Known Allergies     Medications:  Prior to Admission medications   Medication Sig Start Date End Date Taking? Authorizing Provider  acetaminophen (TYLENOL) 650 MG CR tablet Take 650 mg every 8 (eight) hours as needed by  mouth for pain.   Yes [provider]  aspirin 81 MG tablet Take 81 mg by mouth daily.   Yes [provider]  buPROPion (WELLBUTRIN XL) 300 MG 24 hr tablet Take 300 mg by mouth daily.     Yes [provider]  chlorpheniramine (CHLOR-TRIMETON) 4 MG tablet Take 8 mg by mouth at bedtime.   Yes [provider]  Cholecalciferol (VITAMIN D PO) Take 5,000 Units by mouth daily.   Yes [provider]  Cyanocobalamin (B-12) 2500 MCG TABS Take 5,000 mcg by mouth daily.    Yes [provider]  cycloSPORINE (RESTASIS) 0.05 % ophthalmic emulsion Place 1 drop into both eyes 2 (two) times daily.   Yes [provider]  denosumab (PROLIA) 60 MG/ML SOLN injection Inject 60 mg into the skin every 6 (six) months. Administer in upper arm, thigh, or abdomen Last dose was given in August 2017 Next dose due February 2017 Given at Dr Freida Busman [facility] office   Yes [provider]  Diphenhyd-Hydrocort-Nystatin (FIRST-DUKES MOUTHWASH MT) One capful as needed 02/03/17  Yes [provider]  FLUoxetine (PROZAC) 20 MG capsule Take 20 mg by mouth daily.   Yes [provider]  hydrochlorothiazide (HYDRODIURIL) 25 MG tablet Take 1 tablet (25 mg total) by mouth daily. 03/24/16  Yes Isaiah Serge, NP  KLOR-CON M10 10 MEQ tablet TAKE 1 TABLET BY MOUTH EVERY DAY 09/01/17  Yes Jettie Booze, MD  lisdexamfetamine (VYVANSE) 60 MG capsule Take 60 mg by mouth every morning.   Yes [provider]  magnesium gluconate (MAGONATE) 500 MG tablet Take 1 tablet daily by mouth.   Yes [provider]  montelukast (SINGULAIR) 10 MG tablet Take 1 tablet (10 mg total) by mouth at bedtime. 06/30/14  Yes Tanda Rockers, MD  Multiple Vitamins-Minerals (CENTRUM SILVER PO) Take 1 tablet by mouth daily.     Yes [provider]  Potassium 99 MG TABS Take 99 mg by mouth daily.   Yes [provider]  rosuvastatin (CRESTOR) 10 MG  tablet Take 10 mg by mouth daily.   Yes [provider]  traMADol (ULTRAM) 50 MG tablet Take 50 mg as needed by mouth. 01/24/17  Yes [provider]  TURMERIC PO Take by mouth.   Yes [provider]  valACYclovir (VALTREX) 1000 MG tablet Take 1,000 mg as needed by mouth. 06/09/09  Yes [provider]    ROS:  Out of a complete 14 system review of symptoms, the patient complains only of the following symptoms, and all other reviewed systems are  negative.  Gait disorder Neck and back pain  Blood pressure 135/82, pulse 100, height _0  (1.702 m), weight 185 lb (83.9 kg).  Physical Exam  General: The patient is alert and cooperative at the time of the examination.  Skin: No significant peripheral edema is noted.  There is a prosthetic limb on the right lower extremity.   Neurologic Exam  Mental status: The patient is alert and oriented x 3 at the time of the examination. The patient has apparent normal recent and remote memory, with an apparently normal attention span and concentration ability.  Mini-Mental status examination done today shows a total score 29/30.   Cranial nerves: Facial symmetry is present. Speech is normal, no aphasia or dysarthria is noted. Extraocular movements are full. Visual fields are full.  Motor: The patient has good strength in all 4 extremities.  Sensory examination: Soft touch sensation is symmetric on the face, arms, and legs.  Coordination: The patient has good finger-nose-finger and heel-to-shin bilaterally.  Gait and station: The patient has a slightly wide-based, slightly unsteady gait.  The patient can walk independently.  Tandem gait was not attempted.  Romberg is negative but is slightly unsteady.  Reflexes: Deep tendon reflexes are symmetric.   MRI brain 03/30/17:  IMPRESSION:   MRI brain (without) demonstrating: 1. Mild periventricular and subcortical chronic small vessel ischemic disease.  2. No  acute findings.  * MRI scan images were reviewed online. I agree with the written report.    Assessment/Plan:  1.  Mild memory disturbance  2.  Mild gait disturbance  3.  Chronic neck and low back pain  The patient will continue to be followed for her memory issues, she does have multilevel spondylosis that is leading to a lot of neck and low back pain.  She is followed by Dr. Nelva Bush for this.  The patient will follow-up in 1 year, sooner if needed.  The patient is taking 2 different potassium supplementations, she has had some increased muscle cramps in the legs at night, we will check a BMET today.  Jill Alexanders MD 09/18/2017 12:21 PM  [facility] [address] [place], [place] [address]  Phone [phone] Fax [phone]

## 2017-09-19 LAB — BASIC METABOLIC PANEL
BUN/Creatinine Ratio: 15 (ref 12–28)
BUN: 15 mg/dL (ref 8–27)
CO2: 25 mmol/L (ref 20–29)
Calcium: 9.4 mg/dL (ref 8.7–10.3)
Chloride: 102 mmol/L (ref 96–106)
Creatinine, Ser: 1.01 mg/dL — ABNORMAL HIGH (ref 0.57–1.00)
GFR calc Af Amer: 66 mL/min/{1.73_m2} (ref >59)
GFR calc non Af Amer: 57 mL/min/{1.73_m2} — ABNORMAL LOW (ref >59)
Glucose: 88 mg/dL (ref 65–99)
Potassium: 4.5 mmol/L (ref 3.5–5.2)
Sodium: 142 mmol/L (ref 134–144)

## 2017-11-04 DIAGNOSIS — R42 Dizziness and giddiness: Secondary | ICD-10-CM | POA: Diagnosis not present

## 2017-11-04 DIAGNOSIS — R410 Disorientation, unspecified: Secondary | ICD-10-CM | POA: Diagnosis not present

## 2017-11-04 DIAGNOSIS — R270 Ataxia, unspecified: Secondary | ICD-10-CM | POA: Diagnosis not present

## 2017-11-05 ENCOUNTER — Telehealth: Payer: Self-pay | Admitting: Neurology

## 2017-11-05 ENCOUNTER — Encounter: Payer: Self-pay | Admitting: Neurology

## 2017-11-05 NOTE — Telephone Encounter (Signed)
Dr. Caryl Comes from cardiology called in stated that pt went to see her daughter in a [place] and was found to have confusion, dizzy, imbalance and N/V. Went to local hospital, had CT negative and MRI showed remote infarcts, but no acute infarct. She was also told that she had carotid stenosis and needed vascular surgery follow up. Her symptoms getting improved but still not back to baseline and still felt dizzy and not able to walk well. However, she was discharged home. Pt and family have concerns about continued symptoms.   I had discussion with Dr. Caryl Comes that pt condition concerning for posterior stroke which sometimes can not be seen well on the MRI. We also do not know the details of her carotid stenosis. If pt and family concerned and prefer more immediate management, they should go to ER for evaluation. If pt condition stable and getting better, she may need quick outpt clinic visit.   After discussion, pt and family prefer a sooner clinic visit to [place]. She saw Dr. [name] last time on 09/18/17 but Dr. [name] has no openings for the next several days, however, Dr. Leonie Man has 9am next Tuesday opening. Will setup with Dr. Leonie Man 9am 11/07/17. Thanks.   Hi, Inez Catalina and Wells Guiles, could you please give pt a call tomorrow and set up the visit? Thank you so much.  Rosalin Hawking, MD PhD Stroke Neurology 11/05/2017 3:34 PM

## 2017-11-06 NOTE — Telephone Encounter (Signed)
Dr. Erlinda Hong I have called and left a message for this patient to call our office. Per Terrence Dupont Dr. [name] nurse they can see the patient on 11/07/17 @8 :30

## 2017-11-06 NOTE — Telephone Encounter (Signed)
Pt called back and scheduled for Dr. [name] at [facility] w/ phone staff.

## 2017-11-06 NOTE — Telephone Encounter (Signed)
Thank you, Inez Catalina and Terrence Dupont. Really appreciate your help.  Rosalin Hawking, MD PhD Stroke Neurology 11/06/2017 1:02 PM

## 2017-11-07 ENCOUNTER — Encounter: Payer: Self-pay | Admitting: Neurology

## 2017-11-07 ENCOUNTER — Ambulatory Visit (INDEPENDENT_AMBULATORY_CARE_PROVIDER_SITE_OTHER): Payer: Medicare Other | Admitting: Neurology

## 2017-11-07 ENCOUNTER — Other Ambulatory Visit: Payer: Self-pay

## 2017-11-07 VITALS — BP 101/64 | HR 84 | Ht 67.0 in | Wt 183.0 lb

## 2017-11-07 DIAGNOSIS — G459 Transient cerebral ischemic attack, unspecified: Secondary | ICD-10-CM
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
## 2017-11-10 ENCOUNTER — Other Ambulatory Visit: Payer: Self-pay

## 2017-11-10 ENCOUNTER — Telehealth: Payer: Self-pay | Admitting: Neurology

## 2017-11-10 ENCOUNTER — Ambulatory Visit (HOSPITAL_COMMUNITY): Payer: Medicare Other | Attending: Internal Medicine

## 2017-11-10 ENCOUNTER — Encounter: Payer: Self-pay | Admitting: Neurology

## 2017-11-10 DIAGNOSIS — F419 Anxiety disorder, unspecified: Secondary | ICD-10-CM | POA: Diagnosis not present

## 2017-11-10 DIAGNOSIS — R609 Edema, unspecified: Secondary | ICD-10-CM | POA: Insufficient documentation

## 2017-11-10 DIAGNOSIS — I119 Hypertensive heart disease without heart failure: Secondary | ICD-10-CM | POA: Diagnosis not present

## 2017-11-10 DIAGNOSIS — G459 Transient cerebral ischemic attack, unspecified: Secondary | ICD-10-CM | POA: Diagnosis not present

## 2017-11-10 DIAGNOSIS — E785 Hyperlipidemia, unspecified: Secondary | ICD-10-CM | POA: Diagnosis not present

## 2017-11-10 NOTE — Telephone Encounter (Signed)
I called the patient.  The echocardiogram was unremarkable, no evidence of a source of cardioembolic stroke.  Patient will remain on aspirin.  We will follow-up in about 6 months.    2D echo 11/10/17:  Study Conclusions  - Left ventricle: The cavity size was normal. Wall thickness was increased in a pattern of mild LVH. Systolic function was normal. The estimated ejection fraction was in the range of 60% to 65%. Wall motion was normal; there were no regional wall motion abnormalities. Doppler parameters are consistent with abnormal left ventricular relaxation (grade 1 diastolic dysfunction). The E/e&' ratio is between 8-15, suggesting indeterminate LV filling pressure. - Mitral valve: Mildly thickened leaflets . There was trivial regurgitation. - Left atrium: The atrium was normal in size. - Inferior vena cava: The vessel was normal in size. The respirophasic diameter changes were in the normal range (>= 50%), consistent with normal central venous pressure.  Impressions:  - LVEF 60-65%, mild LVH, normal wall motion, grade 1 DD, indeterminate LV filling pressure, trivial MR, normal LA size, normal IVC.

## 2017-11-13 NOTE — Telephone Encounter (Signed)
Called pt. Scheduled 6 month f/u 05/15/18 at 4:00pm, check in 3:30pm. She verbalized understanding and appreciation for call.

## 2017-11-16 ENCOUNTER — Other Ambulatory Visit: Payer: Self-pay

## 2017-11-16 DIAGNOSIS — I771 Stricture of artery: Secondary | ICD-10-CM

## 2017-12-05 ENCOUNTER — Encounter: Payer: Self-pay | Admitting: Neurology

## 2017-12-08 ENCOUNTER — Encounter: Payer: Self-pay | Admitting: Vascular Surgery

## 2017-12-08 ENCOUNTER — Ambulatory Visit (INDEPENDENT_AMBULATORY_CARE_PROVIDER_SITE_OTHER): Payer: Medicare Other | Admitting: Vascular Surgery

## 2017-12-08 ENCOUNTER — Encounter: Payer: Self-pay | Admitting: *Deleted

## 2017-12-08 ENCOUNTER — Other Ambulatory Visit: Payer: Self-pay | Admitting: *Deleted

## 2017-12-08 ENCOUNTER — Ambulatory Visit (HOSPITAL_COMMUNITY)
Admission: RE | Admit: 2017-12-08 | Discharge: 2017-12-08 | Disposition: A | Discharge status: Home / Self Care | Payer: Medicare Other | Source: Ambulatory Visit | Attending: Vascular Surgery | Admitting: Vascular Surgery

## 2017-12-08 VITALS — BP 115/82 | HR 86 | Temp 97.5°F | Resp 16 | Ht 67.0 in | Wt 186.8 lb

## 2017-12-08 DIAGNOSIS — I6521 Occlusion and stenosis of right carotid artery: Secondary | ICD-10-CM | POA: Diagnosis not present

## 2017-12-08 DIAGNOSIS — I771 Stricture of artery: Secondary | ICD-10-CM
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
## 2017-12-08 NOTE — Progress Notes (Signed)
Referring Physician: Dr Carol Ada  Patient name: Shelly [name] MRN: [id number] DOB: 03-25-1951 Sex: female  REASON FOR CONSULT: TIA secondary to subclavian stenosis  HPI: Shelly [name] is a 67 y.o. female referred for evaluation after recent TIA event with right subclavian stenosis.  On June 1 of this year the patient had an event where she had severe confusion and difficulty driving and understanding her actions.  She was sent to the emergency room for evaluation and had a CT Angio of the neck which showed a right subclavian artery stenosis an MRI scan which showed no evidence of stroke but possible symptoms of TIA.  She was also seen by her neurologist.  She currently is on aspirin and Crestor.  She denies any prior TIA events.  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  She did have an MRI in October 2018 which showed no infarct.  She has previous he had a right below-knee amputation but this apparently was for avascular necrosis of the right heel and not secondary to peripheral arterial disease.  He is a former smoker but quit in 2008.  He denies prior history of stroke.  He is able to walk on her prosthetic.  She states that she sometimes has gait abnormalities related to her prosthetic but her ataxia at the time of her event was different.  Other medical problems include hypertension memory issues depression anxiety and arthritis all of which are currently stable.  Past Medical History:  Diagnosis Date  . Anxiety   . Arthritis   . Avascular necrosis of talus ([place])   . Cancer ([place])    vulva pre cancer had surgery  . Depression   . Gait abnormality 03/20/2017  . GERD (gastroesophageal reflux disease)   . Hearing loss    "very minor"  . History of pneumonia   . Hyperlipidemia   . Hypertension   . Leg edema    left  . Memory difficulty 03/20/2017  . PONV (postoperative nausea and vomiting)   . Stress incontinence    Past  Surgical History:  Procedure Laterality Date  . ANKLE FUSION  2011   right multiple   . ANKLE FUSION     rear ankle fusion  . ANKLE SURGERY  2010   right cordicompression  . APPENDECTOMY  05/10/2016  . BELOW KNEE LEG AMPUTATION    . LAPAROSCOPIC APPENDECTOMY N/A 05/10/2016   Procedure: APPENDECTOMY LAPAROSCOPIC;  Surgeon: Coralie Keens, MD;  Location: [facility];  Service: General;  Laterality: N/A;  . LUMBAR LAMINECTOMY/DECOMPRESSION MICRODISCECTOMY N/A 09/09/2015   Procedure:  L4-S1 Decompression/ Discetomy;  Surgeon: Melina Schools, MD;  Location: [facility];  Service: Orthopedics;  Laterality: N/A;  . SKIN GRAFT    . TONSILLECTOMY    . TUBAL LIGATION  1983  . VULVECTOMY N/A 01/28/2015   Procedure: WIDE EXCISION VULVECTOMY;  Surgeon: Thurnell Lose, MD;  Location: [facility];  Service: Gynecology;  Laterality: N/A;    Family History  Problem Relation Age of Onset  . Dementia Mother 61       alive  . Fibromyalgia Mother   . Allergies Mother   . Heart attack Father 38       deceased  . Multiple myeloma Father   . Other Brother        alive  . Heart disease Brother        emergent CABG for 99% blocked CAD  . Heart murmur  Sister 35       she had open heart surgery  . Other Sister 47       She is bIpolar- diabetic  . Breast cancer Sister     SOCIAL HISTORY: Social History   Socioeconomic History  . Marital status: Divorced    Spouse name: Not on file  . Number of children: 3  . Years of education: 33  . Highest education level: Not on file  Occupational History  . Occupation: ACCOUNTING    Employer: [employer]  Social Needs  . Financial resource strain: Not on file  . Food insecurity:    Worry: Not on file    Inability: Not on file  . Transportation needs:    Medical: Not on file    Non-medical: Not on file  Tobacco Use  . Smoking status: Former Smoker    Packs/day: 1.00    Years: 40.00    Pack years: 40.00    Types: Cigarettes    Last attempt to quit: 02/05/2007     Years since quitting: 10.8  . Smokeless tobacco: Never Used  Substance and Sexual Activity  . Alcohol use: Yes    Alcohol/week: 0.0 oz    Comment: socially  . Drug use: No  . Sexual activity: Not on file  Lifestyle  . Physical activity:    Days per week: Not on file    Minutes per session: Not on file  . Stress: Not on file  Relationships  . Social connections:    Talks on phone: Not on file    Gets together: Not on file    Attends religious service: Not on file    Active member of club or organization: Not on file    Attends meetings of clubs or organizations: Not on file    Relationship status: Not on file  . Intimate partner violence:    Fear of current or ex partner: Not on file    Emotionally abused: Not on file    Physically abused: Not on file    Forced sexual activity: Not on file  Other Topics Concern  . Not on file  Social History Narrative   Lives alone   Caffeine use: Tea every morning    Right handed     Allergies  Allergen Reactions  . No Known Allergies     Current Outpatient Medications  Medication Sig Dispense Refill  . acetaminophen (TYLENOL) 650 MG CR tablet Take 650 mg every 8 (eight) hours as needed by mouth for pain.    Marland Kitchen aspirin 325 MG tablet Take 325 mg by mouth daily.    . benzonatate (TESSALON) 200 MG capsule Take 200 mg by mouth 3 (three) times daily.  0  . Black Cohosh (REMIFEMIN PO) Take 1 tablet by mouth 2 (two) times daily. For menopause    . buPROPion (WELLBUTRIN XL) 300 MG 24 hr tablet Take 300 mg by mouth daily.      . chlorpheniramine (CHLOR-TRIMETON) 4 MG tablet Take 8 mg by mouth at bedtime.    . Cholecalciferol (VITAMIN D PO) Take 5,000 Units by mouth daily.    . [employer] ASPIRIN EC 325 MG EC tablet Take 325 mg by mouth daily.  0  . Cyanocobalamin (B-12 PO) Take 5,000 Units by mouth daily.    . cycloSPORINE (RESTASIS) 0.05 % ophthalmic emulsion Place 1 drop into both eyes 2 (two) times daily.    Marland Kitchen denosumab (PROLIA) 60 MG/ML SOLN  injection Inject 60 mg into  the skin every 6 (six) months. Administer in upper arm, thigh, or abdomen Last dose was given in August 2017 Next dose due February 2017 Given at Dr Freida Busman [facility] office    . Diphenhyd-Hydrocort-Nystatin (FIRST-DUKES MOUTHWASH MT) One capful as needed  1  . FLUoxetine (PROZAC) 20 MG capsule Take 20 mg by mouth daily.    Marland Kitchen lisinopril (PRINIVIL,ZESTRIL) 20 MG tablet Take 20 mg by mouth daily.    . Magnesium 400 MG TABS Take 1 tablet by mouth daily.    . montelukast (SINGULAIR) 10 MG tablet Take 1 tablet (10 mg total) by mouth at bedtime. 30 tablet 11  . Multiple Vitamins-Minerals (CENTRUM SILVER PO) Take 1 tablet by mouth daily.      Marland Kitchen PROAIR HFA 108 (90 Base) MCG/ACT inhaler INHALE 1 TO 2 PUFFS EVERY 6 HOURS AS NEEDED FOR WHEEZING OR COUGH  0  . ranitidine (ZANTAC) 150 MG tablet Take 150 mg by mouth 2 (two) times daily.    . rosuvastatin (CRESTOR) 10 MG tablet Take 10 mg by mouth daily.    . rosuvastatin (CRESTOR) 20 MG tablet Take 20 mg by mouth daily.  1  . traMADol (ULTRAM) 50 MG tablet Take 50 mg as needed by mouth.  0  . TURMERIC PO Take by mouth.    . valACYclovir (VALTREX) 1000 MG tablet Take 1,000 mg as needed by mouth.    . lisdexamfetamine (VYVANSE) 60 MG capsule Take 60 mg by mouth every morning.     No current facility-administered medications for this visit.     ROS:   General:  No weight loss, Fever, chills  HEENT: No recent headaches, no nasal bleeding, no visual changes, no sore throat  Neurologic: No dizziness, blackouts, seizures. + recent symptoms of stroke or mini- stroke. No recent episodes of slurred speech, or temporary blindness.  Cardiac: No recent episodes of chest pain/pressure, no shortness of breath at rest.  + shortness of breath with exertion.  Denies history of atrial fibrillation or irregular heartbeat  Vascular: No history of rest pain in feet.  No history of claudication.  No history of non-healing ulcer, No history of DVT     Pulmonary: No home oxygen, no productive cough, no hemoptysis,  No asthma or wheezing  Musculoskeletal:  '[ ]'$  Arthritis, '[ ]'$  Low back pain,  '[ ]'$  Joint pain  Hematologic:No history of hypercoagulable state.  No history of easy bleeding.  No history of anemia  Gastrointestinal: No hematochezia or melena,  No gastroesophageal reflux, no trouble swallowing  Urinary: '[ ]'$  chronic Kidney disease, '[ ]'$  on HD - '[ ]'$  MWF or '[ ]'$  TTHS, '[ ]'$  Burning with urination, '[ ]'$  Frequent urination, '[ ]'$  Difficulty urinating;   Skin: No rashes  Psychological: No history of anxiety,  No history of depression   Physical Examination  Vitals:   12/08/17 1245  BP: 104/67  Pulse: 86  Resp: 16  Temp: (!) 97.5 F (36.4 C)  TempSrc: Oral  SpO2: 94%  Weight: 186 lb 12.8 oz (84.7 kg)  Height: '5\' 7"'$  (1.702 m)    Body mass index is 29.26 kg/m.  Blood pressure was taken in the left arm found to be 111/77 right arm was 115/82  General:  Alert and oriented, no acute distress HEENT: Normal Neck: No bruit or JVD Pulmonary: Clear to auscultation bilaterally Cardiac: Regular Rate and Rhythm without murmur Abdomen: Soft, non-tender, non-distended, no mass Skin: No rash Extremity Pulses:  2+ radial, brachial, femoral, dorsalis pedis  pulses bilaterally Musculoskeletal: No deformity or edema  Neurologic: Upper and lower extremity motor 5/5 and symmetric  DATA:  I reviewed the patient's CT Angio of the neck which showed no significant carotid occlusive disease was suggestive of a high-grade right subclavian artery stenosis at the origin.  Left subclavian and right common carotid origins and left common carotid origins were all patent.  MRI scans showed several areas that appear to be possible punctate areas of infarct but we do not have the final report available for review and we will request this today.  ASSESSMENT: Right subclavian artery stenosis.  However, I doubt this is hemodynamically significant since her  right arm pressure is higher than her left.  Whether or not this caused her TIA type symptoms is debatable.  She did not really have cortical symptoms and had more of confusion and ataxia.   PLAN: Arch aortogram possible subclavian intervention scheduled for December 11, 2017.  Will help further clarify her cerebral perfusion as well as the extent of stenosis of her right subclavian artery.  We will proceed with stenting of the right subclavian artery if this is significantly narrowed.  Risk benefits possible complications of procedure details were discussed with patient including not limited to bleeding infection contrast reaction stroke risk of 100,000.  She understands and agrees to proceed.   [name], MD [facility] Office: [phone] Pager: [phone]

## 2017-12-11 ENCOUNTER — Encounter: Payer: Self-pay | Admitting: Family Medicine

## 2017-12-11 ENCOUNTER — Encounter (HOSPITAL_COMMUNITY)
Admission: RE | Disposition: A | Discharge status: Home / Self Care | Payer: Self-pay | Source: Ambulatory Visit | Attending: Vascular Surgery

## 2017-12-11 ENCOUNTER — Ambulatory Visit (HOSPITAL_COMMUNITY)
Admission: RE | Admit: 2017-12-11 | Discharge: 2017-12-11 | Disposition: A | Discharge status: Home / Self Care | Payer: Medicare Other | Source: Ambulatory Visit | Attending: Vascular Surgery | Admitting: Vascular Surgery

## 2017-12-11 DIAGNOSIS — F329 Major depressive disorder, single episode, unspecified: Secondary | ICD-10-CM | POA: Diagnosis not present

## 2017-12-11 DIAGNOSIS — H5462 Unqualified visual loss, left eye, normal vision right eye: Secondary | ICD-10-CM | POA: Insufficient documentation

## 2017-12-11 DIAGNOSIS — M1990 Unspecified osteoarthritis, unspecified site: Secondary | ICD-10-CM | POA: Diagnosis not present

## 2017-12-11 DIAGNOSIS — F419 Anxiety disorder, unspecified: Secondary | ICD-10-CM | POA: Insufficient documentation

## 2017-12-11 DIAGNOSIS — I708 Atherosclerosis of other arteries: Secondary | ICD-10-CM | POA: Diagnosis not present

## 2017-12-11 DIAGNOSIS — I10 Essential (primary) hypertension: Secondary | ICD-10-CM | POA: Diagnosis not present

## 2017-12-11 DIAGNOSIS — Z87891 Personal history of nicotine dependence: Secondary | ICD-10-CM | POA: Diagnosis not present

## 2017-12-11 DIAGNOSIS — K219 Gastro-esophageal reflux disease without esophagitis: Secondary | ICD-10-CM | POA: Diagnosis not present

## 2017-12-11 DIAGNOSIS — Z7982 Long term (current) use of aspirin: Secondary | ICD-10-CM | POA: Insufficient documentation

## 2017-12-11 DIAGNOSIS — G459 Transient cerebral ischemic attack, unspecified: Secondary | ICD-10-CM | POA: Diagnosis not present

## 2017-12-11 DIAGNOSIS — E785 Hyperlipidemia, unspecified: Secondary | ICD-10-CM | POA: Insufficient documentation

## 2017-12-11 DIAGNOSIS — Z89511 Acquired absence of right leg below knee: Secondary | ICD-10-CM | POA: Insufficient documentation

## 2017-12-11 HISTORY — PX: AORTIC ARCH ANGIOGRAPHY: CATH118224

## 2017-12-11 LAB — POCT I-STAT, CHEM 8
BUN: 20 mg/dL (ref 8–23)
Calcium, Ion: 1.17 mmol/L (ref 1.15–1.40)
Chloride: 104 mmol/L (ref 98–111)
Creatinine, Ser: 0.8 mg/dL (ref 0.44–1.00)
Glucose, Bld: 98 mg/dL (ref 70–99)
HCT: 39 % (ref 36.0–46.0)
Hemoglobin: 13.3 g/dL (ref 12.0–15.0)
Potassium: 4.4 mmol/L (ref 3.5–5.1)
Sodium: 142 mmol/L (ref 135–145)
TCO2: 25 mmol/L (ref 22–32)

## 2017-12-11 SURGERY — AORTIC ARCH ANGIOGRAPHY
Anesthesia: LOCAL

## 2017-12-11 MED ORDER — MORPHINE SULFATE (PF) 10 MG/ML IV SOLN: 2.0000 mg | INTRAVENOUS | Status: DC | PRN

## 2017-12-11 MED ORDER — SODIUM CHLORIDE 0.9 % IV SOLN: 250.0000 mL | INTRAVENOUS | Status: DC | PRN

## 2017-12-11 MED ORDER — SODIUM CHLORIDE 0.9 % IV SOLN
INTRAVENOUS | Status: DC
  Administered 2017-12-11: 12:00:00 via INTRAVENOUS

## 2017-12-11 MED ORDER — SODIUM CHLORIDE 0.9% FLUSH: 3.0000 mL | INTRAVENOUS | Status: DC | PRN

## 2017-12-11 MED ORDER — ONDANSETRON HCL 4 MG/2ML IJ SOLN: 4.0000 mg | Freq: Four times a day (QID) | INTRAMUSCULAR | Status: DC | PRN

## 2017-12-11 MED ORDER — LIDOCAINE HCL (PF) 1 % IJ SOLN
INTRAMUSCULAR | Status: DC | PRN
  Administered 2017-12-11: 18 mL

## 2017-12-11 MED ORDER — LABETALOL HCL 5 MG/ML IV SOLN: 10.0000 mg | INTRAVENOUS | Status: DC | PRN

## 2017-12-11 MED ORDER — OXYCODONE HCL 5 MG PO TABS: 5.0000 mg | ORAL_TABLET | ORAL | Status: DC | PRN

## 2017-12-11 MED ORDER — IODIXANOL 320 MG/ML IV SOLN
INTRAVENOUS | Status: DC | PRN
  Administered 2017-12-11: 60 mL via INTRA_ARTERIAL

## 2017-12-11 MED ORDER — LIDOCAINE HCL (PF) 1 % IJ SOLN
INTRAMUSCULAR | Status: AC
  Filled 2017-12-11: qty 30

## 2017-12-11 MED ORDER — HEPARIN (PORCINE) IN NACL 2-0.9 UNITS/ML
INTRAMUSCULAR | Status: AC | PRN
  Administered 2017-12-11: 1000 mL via INTRA_ARTERIAL

## 2017-12-11 MED ORDER — HEPARIN (PORCINE) IN NACL 2000-0.9 UNIT/L-% IV SOLN
INTRAVENOUS | Status: AC
  Filled 2017-12-11: qty 1000

## 2017-12-11 MED ORDER — SODIUM CHLORIDE 0.9% FLUSH
3.0000 mL | Freq: Two times a day (BID) | INTRAVENOUS | Status: DC
Start: 2017-12-11 — End: 2017-12-11

## 2017-12-11 MED ORDER — ACETAMINOPHEN 325 MG PO TABS: 650.0000 mg | ORAL_TABLET | ORAL | Status: DC | PRN

## 2017-12-11 MED ORDER — HYDRALAZINE HCL 20 MG/ML IJ SOLN: 5.0000 mg | INTRAMUSCULAR | Status: DC | PRN

## 2017-12-11 MED ORDER — SODIUM CHLORIDE 0.9 % IV SOLN: INTRAVENOUS | Status: DC

## 2017-12-11 SURGICAL SUPPLY — 9 items
CATH ANGIO 5F PIGTAIL 100CM (CATHETERS) ×2 IMPLANT
COVER PRB 48X5XTLSCP FOLD TPE (BAG) ×1 IMPLANT
COVER PROBE 5X48 (BAG) ×1
KIT PV (KITS) ×2 IMPLANT
SHEATH PINNACLE 5F 10CM (SHEATH) ×2 IMPLANT
SYR MEDRAD MARK V 150ML (SYRINGE) ×2 IMPLANT
TRANSDUCER W/STOPCOCK (MISCELLANEOUS) ×2 IMPLANT
TRAY PV CATH (CUSTOM PROCEDURE TRAY) ×2 IMPLANT
WIRE HITORQ VERSACORE ST 145CM (WIRE) ×2 IMPLANT

## 2017-12-11 NOTE — Interval H&P Note (Signed)
History and Physical Interval Note:  12/11/2017 2:13 PM  Shelly [name]  has presented today for surgery, with the diagnosis of pvd  The various methods of treatment have been discussed with the patient and family. After consideration of risks, benefits and other options for treatment, the patient has consented to  Procedure(s): AORTIC ARCH ANGIOGRAPHY (N/A) as a surgical intervention .  The patient's history has been reviewed, patient examined, no change in status, stable for surgery.  I have reviewed the patient's chart and labs.  Questions were answered to the patient's satisfaction.     [name]

## 2017-12-11 NOTE — Op Note (Signed)
Procedure: Arch aortogram, ultrasound right common femoral artery  Preoperative diagnosis: TIA  Postoperative diagnosis: Same  Anesthesia: Local  Operative findings: #1 normal arch anatomy with 40% narrowing of right subclavian artery origin #2 no significant carotid or vertebral artery occlusive disease, left vertebral dominant  Operative details: After obtaining informed consent, the patient was taken the PV lab.  The patient was placed in supine position Angio table.  Both groins were prepped and draped in usual sterile fashion.  Ultrasound was used to identify the right common femoral artery and femoral bifurcation.  These were all widely patent.  A permanent image was obtained and placed in the patient's permanent medical record.  Local anesthesia was then treated over the right common femoral artery.  Under ultrasound guidance the right common femoral artery was cannulated and an 035 versacore wire threaded in the abdominal aorta under fluoroscopic guidance.  Next a 5 French sheath was placed over the guidewire into the right common femoral artery.  This was thoroughly flushed with heparin I saline.  A 5 French pigtail catheter was then advanced over the guidewire and these were pushed as a unit up in the ascending aorta.  Arch aortogram was then obtained in a 40 degree LAO projection.  This showed normal arch anatomy with the innominate right subclavian right common carotid left common carotid and left subclavian arteries all patent.  The vertebral arteries were both patent with antegrade flow bilaterally.  The left vertebral was larger and more dominant than the right about 50% larger.  The carotid bifurcations were also visualized and these are widely patent bilaterally.  There was overlap of the right common carotid and right subclavian artery origin.  Therefore a craniocaudal oblique view was obtained to lay out the origins of the right subclavian and right common carotid artery.  These were  both patent.  There was a 40% narrowing of the origin of the right subclavian artery.  At this point the pigtail catheter was pulled back over the guidewire.  A 5 French sheath was left in place to be pulled in the holding area.  The patient tired the procedure well and there were no complications.  Operative management: The patient apparently had a recent TIA event.  CT scan suggested a high-grade right subclavian artery stenosis.  She has symmetric blood pressures.  Angiogram shows 40% of the most narrowing of the right subclavian artery.  I do not believe that this is the etiology of her TIA.  The rest of her great vessels are widely patent.  I do not believe from a vascular standpoint we can explain her current TIA symptoms.  I will defer this to her neurologist Dr. [name] to see if any other further work-up or evaluation is necessary.  From my standpoint it is okay for the patient to drive starting tomorrow.  [name], MD [facility] Office: [phone] Pager: [phone]

## 2017-12-11 NOTE — Discharge Instructions (Signed)
**Note -identified via Obfuscation** Femoral Site Care °Refer to this sheet in the next few weeks. These instructions provide you with information about caring for yourself after your procedure. Your health care provider may also give you more specific instructions. Your treatment has been planned according to current medical practices, but problems sometimes occur. Call your health care provider if you have any problems or questions after your procedure. °What can I expect after the procedure? °After your procedure, it is typical to have the following: °· Bruising at the site that usually fades within 1-2 weeks. °· Blood collecting in the tissue (hematoma) that may be painful to the touch. It should usually decrease in size and tenderness within 1-2 weeks. ° °Follow these instructions at home: °· Take medicines only as directed by your health care provider. °· You may shower 24-48 hours after the procedure or as directed by your health care provider. Remove the bandage (dressing) and gently wash the site with plain soap and water. Pat the area dry with a clean towel. Do not rub the site, because this may cause bleeding. °· Do not take baths, swim, or use a hot tub until your health care provider approves. °· Check your insertion site every day for redness, swelling, or drainage. °· Do not apply powder or lotion to the site. °· Limit use of stairs to twice a day for the first 2-3 days or as directed by your health care provider. °· Do not squat for the first 2-3 days or as directed by your health care provider. °· Do not lift over 10 lb (4.5 kg) for 5 days after your procedure or as directed by your health care provider. °· Ask your health care provider when it is okay to: °? Return to work or school. °? Resume usual physical activities or sports. °? Resume sexual activity. °· Do not drive home if you are discharged the same day as the procedure. Have someone else drive you. °· You may drive 24 hours after the procedure unless otherwise instructed by  your health care provider. °· Do not operate machinery or power tools for 24 hours after the procedure or as directed by your health care provider. °· If your procedure was done as an outpatient procedure, which means that you went home the same day as your procedure, a responsible adult should be with you for the first 24 hours after you arrive home. °· Keep all follow-up visits as directed by your health care provider. This is important. °Contact a health care provider if: °· You have a fever. °· You have chills. °· You have increased bleeding from the site. Hold pressure on the site. °Get help right away if: °· You have unusual pain at the site. °· You have redness, warmth, or swelling at the site. °· You have drainage (other than a small amount of blood on the dressing) from the site. °· The site is bleeding, and the bleeding does not stop after 30 minutes of holding steady pressure on the site. °· Your leg or foot becomes pale, cool, tingly, or numb. °This information is not intended to replace advice given to you by your health care provider. Make sure you discuss any questions you have with your health care provider. °Document Released: 01/24/2014 Document Revised: 10/29/2015 Document Reviewed: 12/10/2013 °Elsevier Interactive Patient Education © 2018 [employer] ° °

## 2017-12-12 ENCOUNTER — Encounter (HOSPITAL_COMMUNITY): Payer: Self-pay | Admitting: Vascular Surgery

## 2017-12-12 ENCOUNTER — Encounter: Payer: Self-pay | Admitting: Neurology

## 2017-12-12 ENCOUNTER — Telehealth: Payer: Self-pay | Admitting: Neurology

## 2017-12-12 NOTE — Telephone Encounter (Signed)
I called pt. Shelly message per Dr. Rexene Alberts note. She verbalized understanding and appreciation for asking WID. She is ok to wait until Dr. [name] returns to see if he will clear her to drive. Advised I will send him a message to address this when he returns to the office. She is aware he is out this week and next week.

## 2017-12-12 NOTE — Telephone Encounter (Signed)
Please call pt re: email.  I don't feel comfortable releasing pt back to driving. Please make pt aware, that Dr. [name] will review after his return.  I am not sure if she had a TIA, it is certainly in the possible differential diagnoses. Transient neurological symptoms can be seen in the context of sleep deprivation, dehydration, infection, stress, TIA, migraine, medication side effect and seizures, to name a few causes.

## 2017-12-24 NOTE — Telephone Encounter (Signed)
I called the patient.  I do not feel that the patient should return to driving at this point, I would wait at least 3 months from the time of the event, it is not clear what happened in early June 2019.  If the patient does well at the beginning of September 2019 without recurring events, I will let her return to driving in full capacity.

## 2017-12-25 ENCOUNTER — Encounter: Payer: Self-pay | Admitting: Neurology

## 2017-12-26 ENCOUNTER — Encounter: Payer: Medicare Other | Admitting: Vascular Surgery

## 2017-12-26 ENCOUNTER — Encounter (HOSPITAL_COMMUNITY): Payer: Medicare Other

## 2018-03-26 ENCOUNTER — Other Ambulatory Visit: Payer: Self-pay | Admitting: Obstetrics and Gynecology

## 2018-03-26 DIAGNOSIS — Z1231 Encounter for screening mammogram for malignant neoplasm of breast: Secondary | ICD-10-CM

## 2018-04-27 ENCOUNTER — Other Ambulatory Visit: Payer: Self-pay | Admitting: Obstetrics and Gynecology

## 2018-04-27 DIAGNOSIS — M8580 Other specified disorders of bone density and structure, unspecified site: Secondary | ICD-10-CM

## 2018-05-01 ENCOUNTER — Encounter: Payer: Self-pay | Admitting: Interventional Cardiology

## 2018-05-07 ENCOUNTER — Ambulatory Visit
Admission: RE | Admit: 2018-05-07 | Discharge: 2018-05-07 | Disposition: A | Discharge status: Home / Self Care | Payer: Medicare Other | Source: Ambulatory Visit | Attending: Obstetrics and Gynecology | Admitting: Obstetrics and Gynecology

## 2018-05-07 DIAGNOSIS — Z1231 Encounter for screening mammogram for malignant neoplasm of breast: Secondary | ICD-10-CM

## 2018-05-15 ENCOUNTER — Encounter: Payer: Self-pay | Admitting: Neurology

## 2018-05-15 ENCOUNTER — Ambulatory Visit (INDEPENDENT_AMBULATORY_CARE_PROVIDER_SITE_OTHER): Payer: Medicare Other | Admitting: Neurology

## 2018-05-15 VITALS — BP 113/79 | HR 91 | Ht 67.0 in | Wt 184.0 lb

## 2018-05-15 DIAGNOSIS — R404 Transient alteration of awareness: Secondary | ICD-10-CM

## 2018-05-15 NOTE — Progress Notes (Signed)
Reason for visit: Transient alteration of awareness  Shelly [name] is an 67 y.o. female  History of present illness:  Shelly [name] is a 67 year old right-handed white female with a history of an event that occurred on 04 November 2017.  The patient appeared to be drunk and staggery, slurring her speech.  The event lasted less than 24 hours and then fully cleared, MRI of the brain did not show an acute stroke, a vascular work-up has been unremarkable.  The patient has not had any recurrence of symptoms, she is back to driving at this point.  The etiology of the event is not clear.  The patient returns to this office for an evaluation, she has done well medically, she has had some eye surgery for a macular hole in the retina, otherwise no other medical procedures have been done.  Past Medical History:  Diagnosis Date  . Anxiety   . Arthritis   . Avascular necrosis of talus ([place])   . Cancer ([place])    vulva pre cancer had surgery  . Depression   . Gait abnormality 03/20/2017  . GERD (gastroesophageal reflux disease)   . Hearing loss    "very minor"  . History of pneumonia   . Hyperlipidemia   . Hypertension   . Leg edema    left  . Memory difficulty 03/20/2017  . PONV (postoperative nausea and vomiting)   . Stress incontinence     Past Surgical History:  Procedure Laterality Date  . ANKLE FUSION  2011   right multiple   . ANKLE FUSION     rear ankle fusion  . ANKLE SURGERY  2010   right cordicompression  . AORTIC ARCH ANGIOGRAPHY N/A 12/11/2017   Procedure: AORTIC ARCH ANGIOGRAPHY;  Surgeon: Elam Dutch, MD;  Location: [facility] [facility];  Service: Cardiovascular;  Laterality: N/A;  . APPENDECTOMY  05/10/2016  . BELOW KNEE LEG AMPUTATION    . LAPAROSCOPIC APPENDECTOMY N/A 05/10/2016   Procedure: APPENDECTOMY LAPAROSCOPIC;  Surgeon: Coralie Keens, MD;  Location: [facility];  Service: General;  Laterality: N/A;  . LUMBAR LAMINECTOMY/DECOMPRESSION MICRODISCECTOMY N/A 09/09/2015   Procedure:  L4-S1 Decompression/ Discetomy;  Surgeon: Melina Schools, MD;  Location: [employer];  Service: Orthopedics;  Laterality: N/A;  . SKIN GRAFT    . TONSILLECTOMY    . TUBAL LIGATION  1983  . VULVECTOMY N/A 01/28/2015   Procedure: WIDE EXCISION VULVECTOMY;  Surgeon: Thurnell Lose, MD;  Location: [facility];  Service: Gynecology;  Laterality: N/A;    Family History  Problem Relation Age of Onset  . Dementia Mother 1       alive  . Fibromyalgia Mother   . Allergies Mother   . Heart attack Father 39       deceased  . Multiple myeloma Father   . Other Brother        alive  . Heart disease Brother        emergent CABG for 99% blocked CAD  . Heart murmur Sister 48       she had open heart surgery  . Other Sister 43       She is bIpolar- diabetic  . Breast cancer Sister     Social history:  reports that she quit smoking about 11 years ago. Her smoking use included cigarettes. She has a 40.00 pack-year smoking history. She has never used smokeless tobacco. She reports that she drinks alcohol. She reports that she does not use drugs.  No Known Allergies  Medications:  Prior to Admission medications   Medication Sig Start Date End Date Taking? Authorizing Provider  acetaminophen (TYLENOL) 650 MG CR tablet Take 650 mg every 8 (eight) hours as needed by mouth for pain.   Yes [provider]  aspirin 325 MG tablet Take 325 mg by mouth daily.   Yes [provider]  buPROPion (WELLBUTRIN XL) 300 MG 24 hr tablet Take 300 mg by mouth daily.     Yes [provider]  chlorpheniramine (CHLOR-TRIMETON) 4 MG tablet Take 8 mg by mouth at bedtime.   Yes [provider]  Cholecalciferol (VITAMIN D PO) Take 5,000 Units by mouth daily.   Yes [provider]  Cyanocobalamin (B-12 PO) Take 5,000 Units by mouth daily.   Yes [provider]  cycloSPORINE (RESTASIS) 0.05 % ophthalmic emulsion Place 1 drop into both eyes 2 (two) times daily.   Yes  [provider]  denosumab (PROLIA) 60 MG/ML SOLN injection Inject 60 mg into the skin every 6 (six) months. Administer in upper arm, thigh, or abdomen Last dose was given in August 2017 Next dose due February 2017 Given at Dr Freida [facility]man [facility] office   Yes [provider]  Esomeprazole Magnesium (NEXIUM PO) Take by mouth.   Yes [provider]  FLUoxetine (PROZAC) 20 MG capsule Take 20 mg by mouth daily.   Yes [provider]  lisinopril (PRINIVIL,ZESTRIL) 20 MG tablet Take 20 mg by mouth daily. 11/05/17  Yes [provider]  montelukast (SINGULAIR) 10 MG tablet Take 1 tablet (10 mg total) by mouth at bedtime. 06/30/14  Yes Tanda Rockers, MD  Multiple Vitamins-Minerals (CENTRUM SILVER PO) Take 1 tablet by mouth daily.     Yes [provider]  PROAIR HFA 108 (90 Base) MCG/ACT inhaler INHALE 1 TO 2 PUFFS EVERY 6 HOURS AS NEEDED FOR WHEEZING OR COUGH 11/08/17  Yes [provider]  rosuvastatin (CRESTOR) 20 MG tablet Take 20 mg by mouth daily. 10/18/17  Yes [provider]  traMADol (ULTRAM) 50 MG tablet Take 50 mg by mouth daily as needed for severe pain.  01/24/17  Yes [provider]  valACYclovir (VALTREX) 1000 MG tablet Take 1,000 mg by mouth daily as needed.  06/09/09  Yes [provider]    ROS:  Out of a complete 14 system review of symptoms, the patient complains only of the following symptoms, and all other reviewed systems are negative.  Hearing loss Eye discharge, loss of vision Leg swelling Flushing Restless leg Joint pain, back pain, aching muscles, muscle cramps, walking difficulty, neck pain, neck stiffness  Blood pressure 113/79, pulse 91, height '5\' 7"'$  (1.702 m), weight 184 lb (83.5 kg).  Physical Exam  General: The patient is alert and cooperative at the time of the examination.  Skin: No significant peripheral edema is noted.  The right lower extremity is prosthetic, below-knee  amputation.   Neurologic Exam  Mental status: The patient is alert and oriented x 3 at the time of the examination. The patient has apparent normal recent and remote memory, with an apparently normal attention span and concentration ability.   Cranial nerves: Facial symmetry is present. Speech is normal, no aphasia or dysarthria is noted. Extraocular movements are full. Visual fields are full.  Motor: The patient has good strength in all 4 extremities.  Sensory examination: Soft touch sensation is symmetric on the face, arms, and legs.  Coordination: The patient has good finger-nose-finger and heel-to-shin  bilaterally.  Gait and station: The patient has a normal gait. Romberg is negative. No drift is seen.  Reflexes: Deep tendon reflexes are symmetric.   Assessment/Plan:  1.  Transient alteration of awareness  The etiology of the above events is not clear, the patient has not had any recurrence, she currently has no neurologic restrictions at this point with her activities.  She will call me if any new issues arise.  Jill Alexanders MD 05/15/2018 4:19 PM  [facility] [address] [place], [place] [address]  Phone [phone] Fax [phone]

## 2018-05-16 ENCOUNTER — Telehealth: Payer: Self-pay | Admitting: Neurology

## 2018-05-16 NOTE — Telephone Encounter (Signed)
Patient states she was to call back to discuss medications she is taking.

## 2018-05-16 NOTE — Telephone Encounter (Signed)
I contacted the patient. She wanted to advise she is currently taking 20 mg of Nexium daily. Patient was unsure of the dosage at her appointment on 05/15/2018 and wanted this update in her current medication list.  Medication strength placed in chart per request.  MB RN.

## 2018-05-22 ENCOUNTER — Other Ambulatory Visit: Payer: Medicare Other

## 2018-06-11 ENCOUNTER — Ambulatory Visit (INDEPENDENT_AMBULATORY_CARE_PROVIDER_SITE_OTHER): Payer: Medicare Other | Admitting: Interventional Cardiology

## 2018-06-11 ENCOUNTER — Encounter

## 2018-06-11 ENCOUNTER — Encounter: Payer: Self-pay | Admitting: Interventional Cardiology

## 2018-06-11 VITALS — BP 126/72 | HR 95 | Ht 67.0 in | Wt 188.1 lb

## 2018-06-11 DIAGNOSIS — E785 Hyperlipidemia, unspecified: Secondary | ICD-10-CM | POA: Diagnosis not present

## 2018-06-11 DIAGNOSIS — R600 Localized edema: Secondary | ICD-10-CM

## 2018-06-11 DIAGNOSIS — I2584 Coronary atherosclerosis due to calcified coronary lesion: Secondary | ICD-10-CM | POA: Diagnosis not present

## 2018-06-11 DIAGNOSIS — I10 Essential (primary) hypertension: Secondary | ICD-10-CM | POA: Diagnosis not present

## 2018-06-11 DIAGNOSIS — I2510 Atherosclerotic heart disease of native coronary artery without angina pectoris: Secondary | ICD-10-CM | POA: Diagnosis not present

## 2018-06-11 DIAGNOSIS — I739 Peripheral vascular disease, unspecified: Secondary | ICD-10-CM | POA: Diagnosis not present

## 2018-06-11 NOTE — Progress Notes (Signed)
Cardiology Office Note   Date:  06/11/2018   ID:  Shelly [name], Shelly [name] [birth year], MRN [id number]  PCP:  Carol Ada, MD    No chief complaint on file.  Coronary calcium  Wt Readings from Last 3 Encounters:  06/11/18 188 lb 1.9 oz (85.3 kg)  05/15/18 184 lb (83.5 kg)  12/11/17 180 lb (81.6 kg)       History of Present Illness: Shelly [name] is a 68 y.o. female  Who had coronary calcium noted in 2015. She was asymptomatic and then had a stress test that was negative. She has RF for CAD including high cholesterol. Her brother had CABG in he past few years.  SHe had back surgery in 2017 and there were no cardiac issues at that time.   She saw vascular surgery as follows: "Shelly [name] is a 68 y.o. female referred for evaluation after recent TIA event with right subclavian stenosis.  On June 1 of this year the patient had an event where she had severe confusion and difficulty driving and understanding her actions.  She was sent to the emergency room for evaluation and had a CT Angio of the neck which showed a right subclavian artery stenosis an MRI scan which showed no evidence of stroke but possible symptoms of TIA.  She was also seen by her neurologist.  She currently is on aspirin and Crestor.  She denies any prior TIA events.  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  She did have an MRI in October 2018 which showed no infarct.  "  Angio by Dr. Oneida Alar in 7/19 showed: " The patient apparently had a recent TIA event.  CT scan suggested a high-grade right subclavian artery stenosis.  She has symmetric blood pressures.  Angiogram shows 40% of the most narrowing of the right subclavian artery.  I do not believe that this is the etiology of her TIA.  The rest of her great vessels are widely patent.  I do not believe from a vascular standpoint we can explain her current TIA symptoms.  I will defer this to her neurologist Dr. [name] to  see if any other further work-up or evaluation is necessary.  From my standpoint it is okay for the patient to drive starting tomorrow.."  She has been checked for dementia.  She had some vascular changes in her brain  Past Medical History:  Diagnosis Date  . Anxiety   . Arthritis   . Avascular necrosis of talus ([place])   . Cancer ([place])    vulva pre cancer had surgery  . Depression   . Gait abnormality 03/20/2017  . GERD (gastroesophageal reflux disease)   . Hearing loss    "very minor"  . History of pneumonia   . Hyperlipidemia   . Hypertension   . Leg edema    left  . Memory difficulty 03/20/2017  . PONV (postoperative nausea and vomiting)   . Stress incontinence     Past Surgical History:  Procedure Laterality Date  . ANKLE FUSION  2011   right multiple   . ANKLE FUSION     rear ankle fusion  . ANKLE SURGERY  2010   right cordicompression  . AORTIC ARCH ANGIOGRAPHY N/A 12/11/2017   Procedure: AORTIC ARCH ANGIOGRAPHY;  Surgeon: Elam Dutch, MD;  Location: [facility];  Service: Cardiovascular;  Laterality: N/A;  . APPENDECTOMY  05/10/2016  . BELOW KNEE  LEG AMPUTATION    . LAPAROSCOPIC APPENDECTOMY N/A 05/10/2016   Procedure: APPENDECTOMY LAPAROSCOPIC;  Surgeon: Coralie Keens, MD;  Location: [facility];  Service: General;  Laterality: N/A;  . LUMBAR LAMINECTOMY/DECOMPRESSION MICRODISCECTOMY N/A 09/09/2015   Procedure:  L4-S1 Decompression/ Discetomy;  Surgeon: Melina Schools, MD;  Location: [facility];  Service: Orthopedics;  Laterality: N/A;  . SKIN GRAFT    . TONSILLECTOMY    . TUBAL LIGATION  1983  . VULVECTOMY N/A 01/28/2015   Procedure: WIDE EXCISION VULVECTOMY;  Surgeon: Thurnell Lose, MD;  Location: [facility];  Service: Gynecology;  Laterality: N/A;     Current Outpatient Medications  Medication Sig Dispense Refill  . acetaminophen (TYLENOL) 650 MG CR tablet Take 650 mg every 8 (eight) hours as needed by mouth for pain.    Marland Kitchen aspirin 325 MG tablet Take 325 mg by  mouth daily.    . benzonatate (TESSALON) 200 MG capsule Take 200 mg by mouth 3 (three) times daily as needed for cough.    Marland Kitchen buPROPion (WELLBUTRIN XL) 300 MG 24 hr tablet Take 300 mg by mouth daily.      . chlorpheniramine (CHLOR-TRIMETON) 4 MG tablet Take 8 mg by mouth at bedtime.    . Cholecalciferol (VITAMIN D PO) Take 5,000 Units by mouth daily.    . Cyanocobalamin (B-12 PO) Take 5,000 Units by mouth daily.    . cycloSPORINE (RESTASIS) 0.05 % ophthalmic emulsion Place 1 drop into both eyes 2 (two) times daily.    Marland Kitchen denosumab (PROLIA) 60 MG/ML SOLN injection Inject 60 mg into the skin every 6 (six) months. Administer in upper arm, thigh, or abdomen Last dose was given in August 2017 Next dose due February 2017 Given at Dr Freida Busman [facility] office    . Esomeprazole Magnesium (NEXIUM PO) Take 20 mg by mouth.     Marland Kitchen FLUoxetine (PROZAC) 20 MG capsule Take 20 mg by mouth daily.    Marland Kitchen lisinopril (PRINIVIL,ZESTRIL) 20 MG tablet Take 20 mg by mouth daily.    . montelukast (SINGULAIR) 10 MG tablet Take 1 tablet (10 mg total) by mouth at bedtime. 30 tablet 11  . Multiple Vitamins-Minerals (CENTRUM SILVER PO) Take 1 tablet by mouth daily.      Marland Kitchen PROAIR HFA 108 (90 Base) MCG/ACT inhaler INHALE 1 TO 2 PUFFS EVERY 6 HOURS AS NEEDED FOR WHEEZING OR COUGH  0  . rosuvastatin (CRESTOR) 20 MG tablet Take 20 mg by mouth daily.  1  . traMADol (ULTRAM) 50 MG tablet Take 50 mg by mouth daily as needed for severe pain.   0  . valACYclovir (VALTREX) 1000 MG tablet Take 1,000 mg by mouth daily as needed.      No current facility-administered medications for this visit.     Allergies:   Patient has no known allergies.    Social History:  The patient  reports that she quit smoking about 11 years ago. Her smoking use included cigarettes. She has a 40.00 pack-year smoking history. She has never used smokeless tobacco. She reports current alcohol use. She reports that she does not use drugs.   Family History:  The  patient's family history includes Allergies in her mother; Breast cancer in her sister; Dementia (age of onset: 20) in her mother; Fibromyalgia in her mother; Heart attack (age of onset: 44) in her father; Heart disease in her brother; Heart murmur (age of onset: 14) in her sister; Multiple myeloma in her father; Other in her brother; Other (age of onset:  67) in her sister.    ROS:  Please see the history of present illness.   Otherwise, review of systems are positive for leg swelling.   All other systems are reviewed and negative.    PHYSICAL EXAM: VS:  BP 126/72   Pulse 95   Ht '5\' 7"'$  (1.702 m)   Wt 188 lb 1.9 oz (85.3 kg)   SpO2 96%   BMI 29.46 kg/m  , BMI Body mass index is 29.46 kg/m. GEN: Well nourished, well developed, in no acute distress  HEENT: normal  Neck: no JVD, carotid bruits, or masses Cardiac: RRR; no murmurs, rubs, or gallops,tr left leg edema  Respiratory:  clear to auscultation bilaterally, normal work of breathing GI: soft, nontender, nondistended, + BS MS: right leg prosthetic Skin: warm and dry, no rash Neuro:  Strength and sensation are intact Psych: euthymic mood, full affect   EKG:   The ekg ordered in 7/19 demonstrates NSR, prolonged PR, no ST changes   Recent Labs: 12/11/2017: BUN 20; Creatinine, Ser 0.80; Hemoglobin 13.3; Potassium 4.4; Sodium 142   Lipid Panel No results found for: CHOL, TRIG, HDL, CHOLHDL, VLDL, LDLCALC, LDLDIRECT   Other studies Reviewed: Additional studies/ records that were reviewed today with results demonstrating: labs reviewed.   ASSESSMENT AND PLAN:  1. Coronary calcium: Her aspirin dose was increased after the ?TIA event.  She feels better taking the higher dose.  She can go to 162 mg daily.   Continue aggressive secondary prevention.  2. Hyperlipidemia: The current medical regimen is effective;  continue present plan and medications.  Crestor 20 mg daily.  LDL 85 in 12/19 3. HTN: The current medical regimen is  effective;  continue present plan and medications. 4. Elevate left leg: mild swelling 5. PAD: Subclavian stenosis of 40%. 6. Keto diet.  I explained that from a heart standpoint, plant based and mediterranean diet have better data looking forward at reducing coronary events.    Current medicines are reviewed at length with the patient today.  The patient concerns regarding her medicines were addressed.  The following changes have been made:  No change  Labs/ tests ordered today include:  No orders of the defined types were placed in this encounter.   Recommend 150 minutes/week of aerobic exercise Low fat, low carb, high fiber diet recommended  Disposition:   FU in 1 year   Signed, Larae Grooms, MD  06/11/2018 12:15 PM    [facility] [place], [place], [place]  [address] Phone: [phone]; Fax: [phone]

## 2018-06-11 NOTE — Patient Instructions (Signed)

## 2018-06-12 ENCOUNTER — Ambulatory Visit: Payer: Medicare Other | Admitting: Interventional Cardiology

## 2018-06-21 DIAGNOSIS — H02015 Cicatricial entropion of left lower eyelid: Secondary | ICD-10-CM | POA: Diagnosis not present

## 2018-06-21 DIAGNOSIS — H02012 Cicatricial entropion of right lower eyelid: Secondary | ICD-10-CM | POA: Diagnosis not present

## 2018-06-28 ENCOUNTER — Other Ambulatory Visit: Payer: Self-pay | Admitting: Obstetrics and Gynecology

## 2018-06-28 DIAGNOSIS — K5900 Constipation, unspecified: Secondary | ICD-10-CM | POA: Diagnosis not present

## 2018-06-28 DIAGNOSIS — K635 Polyp of colon: Secondary | ICD-10-CM | POA: Diagnosis not present

## 2018-06-28 DIAGNOSIS — M810 Age-related osteoporosis without current pathological fracture: Secondary | ICD-10-CM

## 2018-06-29 ENCOUNTER — Ambulatory Visit
Admission: RE | Admit: 2018-06-29 | Discharge: 2018-06-29 | Disposition: A | Discharge status: Home / Self Care | Payer: Medicare Other | Source: Ambulatory Visit | Attending: Obstetrics and Gynecology | Admitting: Obstetrics and Gynecology

## 2018-06-29 DIAGNOSIS — M810 Age-related osteoporosis without current pathological fracture: Secondary | ICD-10-CM

## 2018-06-29 DIAGNOSIS — Z780 Asymptomatic menopausal state: Secondary | ICD-10-CM | POA: Diagnosis not present

## 2018-06-29 DIAGNOSIS — M85851 Other specified disorders of bone density and structure, right thigh: Secondary | ICD-10-CM | POA: Diagnosis not present

## 2018-07-10 DIAGNOSIS — R05 Cough: Secondary | ICD-10-CM | POA: Diagnosis not present

## 2018-07-12 DIAGNOSIS — K5900 Constipation, unspecified: Secondary | ICD-10-CM | POA: Diagnosis not present

## 2018-07-12 DIAGNOSIS — D123 Benign neoplasm of transverse colon: Secondary | ICD-10-CM | POA: Diagnosis not present

## 2018-07-12 DIAGNOSIS — K5730 Diverticulosis of large intestine without perforation or abscess without bleeding: Secondary | ICD-10-CM | POA: Diagnosis not present

## 2018-07-12 DIAGNOSIS — Z86010 Personal history of colonic polyps: Secondary | ICD-10-CM | POA: Diagnosis not present

## 2018-07-17 DIAGNOSIS — D123 Benign neoplasm of transverse colon: Secondary | ICD-10-CM | POA: Diagnosis not present

## 2018-08-02 DIAGNOSIS — H02012 Cicatricial entropion of right lower eyelid: Secondary | ICD-10-CM | POA: Diagnosis not present

## 2018-08-02 DIAGNOSIS — H02015 Cicatricial entropion of left lower eyelid: Secondary | ICD-10-CM | POA: Diagnosis not present

## 2018-08-08 ENCOUNTER — Ambulatory Visit: Payer: Medicare Other | Admitting: Interventional Cardiology

## 2018-08-10 DIAGNOSIS — M961 Postlaminectomy syndrome, not elsewhere classified: Secondary | ICD-10-CM | POA: Diagnosis not present

## 2018-08-15 DIAGNOSIS — R6889 Other general symptoms and signs: Secondary | ICD-10-CM | POA: Diagnosis not present

## 2018-08-15 DIAGNOSIS — B349 Viral infection, unspecified: Secondary | ICD-10-CM | POA: Diagnosis not present

## 2018-09-10 ENCOUNTER — Telehealth (INDEPENDENT_AMBULATORY_CARE_PROVIDER_SITE_OTHER): Payer: Self-pay | Admitting: Orthopedic Surgery

## 2018-09-10 NOTE — Telephone Encounter (Signed)
Patient request Rx for [employer] for Right leg sleeve(2) & liners (2). Patient's callback # [phone]

## 2018-09-11 NOTE — Telephone Encounter (Signed)
I called and lm on vm to advise the pt that she has not been in the office since 2018 and insurance will require a visit within a years time documentation with the order for new prosthetic supplies. Can call the office and make an appt at any time.

## 2018-09-20 ENCOUNTER — Ambulatory Visit: Payer: Medicare Other | Admitting: Neurology

## 2018-10-09 ENCOUNTER — Other Ambulatory Visit: Payer: Self-pay

## 2018-10-09 ENCOUNTER — Ambulatory Visit (INDEPENDENT_AMBULATORY_CARE_PROVIDER_SITE_OTHER): Payer: Medicare Other | Admitting: Orthopedic Surgery

## 2018-10-09 ENCOUNTER — Encounter: Payer: Self-pay | Admitting: Orthopedic Surgery

## 2018-10-09 VITALS — Ht 67.0 in | Wt 188.1 lb

## 2018-10-09 DIAGNOSIS — Z89511 Acquired absence of right leg below knee: Secondary | ICD-10-CM

## 2018-10-09 DIAGNOSIS — I2584 Coronary atherosclerosis due to calcified coronary lesion: Secondary | ICD-10-CM

## 2018-10-09 DIAGNOSIS — I2510 Atherosclerotic heart disease of native coronary artery without angina pectoris: Secondary | ICD-10-CM | POA: Diagnosis not present

## 2018-10-09 NOTE — Progress Notes (Signed)
Office Visit Note   Patient: Shelly [name]           Date of Birth: [birth year]           MRN: [id number] Visit Date: 10/09/2018              Requested by: Carol Ada, [place], [place] [address] PCP: Carol Ada, MD  Chief Complaint  Patient presents with  . Right Leg - Follow-up    Prosthesis Evaulation      HPI: Patient is a 68 year old woman who presents in follow-up for right transtibial amputation.  The amputation was performed at [facility] she had skin grafting and difficulty with wound healing she currently has a prosthesis with a suction fit her socket and liners are worn out she states she occasionally gets a rash over the leg from sweating in her socket.  Assessment & Plan: Visit Diagnoses:  1. History of right below knee amputation [facility])     Plan: Patient was given a Vive compression shrinker to wear underneath the silicone liner she states that this felt good with the fit and was comfortable to walk in.  She will use this to minimize risk of a rash.  Follow-Up Instructions: Return if symptoms worsen or fail to improve.   Ortho Exam  Patient is alert, oriented, no adenopathy, well-dressed, normal affect, normal respiratory effort. Examination patient has no open ulcers no redness no swelling no cellulitis of the residual limb.  The liners have tears and are worn out.  Her socket is worn out.  There is redundant tissue over the inferior aspect of the residual leg but no open wounds.  Imaging: No results found. No images are attached to the encounter.  Labs: Lab Results  Component Value Date   ESRSEDRATE 2 03/20/2017   ESRSEDRATE 10 09/08/2010     No results found for: ALBUMIN, PREALBUMIN, LABURIC  Body mass index is 29.46 kg/m.  Orders:  No orders of the defined types were placed in this encounter.  No orders of the defined types were placed in this encounter.    Procedures: No procedures performed  Clinical Data:  No additional findings.  ROS:  All other systems negative, except as noted in the HPI. Review of Systems  Objective: Vital Signs: Ht '5\' 7"'$  (1.702 m)   Wt 188 lb 1.9 oz (85.3 kg)   BMI 29.46 kg/m   Specialty Comments:  No specialty comments available.  PMFS History: Patient Active Problem List   Diagnosis Date Noted  . Memory difficulty 03/20/2017  . Gait abnormality 03/20/2017  . History of right below knee amputation ([place]) 07/11/2016  . Adenomatous colon polyp 05/10/2016  . Coronary artery calcification 01/07/2016  . Essential hypertension 01/07/2016  . Back pain 09/09/2015  . Abnormal CXR 05/16/2014  . Cough variant asthma 05/15/2014  . Acute post-operative pain 02/18/2012  . Adiposity 02/17/2012  . Does use eyeglasses 02/13/2012  . H/O arthrodesis 10/21/2011  . Fibromyalgia 04/11/2011  . HLD (hyperlipidemia) 04/11/2011  . Leg edema 09/08/2010   Past Medical History:  Diagnosis Date  . Anxiety   . Arthritis   . Avascular necrosis of talus ([place])   . Cancer ([place])    vulva pre cancer had surgery  . Depression   . Gait abnormality 03/20/2017  . GERD (gastroesophageal reflux disease)   . Hearing loss    "very minor"  . History of pneumonia   . Hyperlipidemia   . Hypertension   .  Leg edema    left  . Memory difficulty 03/20/2017  . PONV (postoperative nausea and vomiting)   . Stress incontinence     Family History  Problem Relation Age of Onset  . Dementia Mother 38       alive  . Fibromyalgia Mother   . Allergies Mother   . Heart attack Father 37       deceased  . Multiple myeloma Father   . Other Brother        alive  . Heart disease Brother        emergent CABG for 99% blocked CAD  . Heart murmur Sister 42       she had open heart surgery  . Other Sister 17       She is bIpolar- diabetic  . Breast cancer Sister     Past Surgical History:  Procedure Laterality Date  . ANKLE FUSION  2011   right multiple   . ANKLE FUSION     rear ankle  fusion  . ANKLE SURGERY  2010   right cordicompression  . AORTIC ARCH ANGIOGRAPHY N/A 12/11/2017   Procedure: AORTIC ARCH ANGIOGRAPHY;  Surgeon: Elam Dutch, MD;  Location: [place] [facility];  Service: Cardiovascular;  Laterality: N/A;  . APPENDECTOMY  05/10/2016  . BELOW KNEE LEG AMPUTATION    . LAPAROSCOPIC APPENDECTOMY N/A 05/10/2016   Procedure: APPENDECTOMY LAPAROSCOPIC;  Surgeon: Coralie Keens, MD;  Location: [place];  Service: General;  Laterality: N/A;  . LUMBAR LAMINECTOMY/DECOMPRESSION MICRODISCECTOMY N/A 09/09/2015   Procedure:  L4-S1 Decompression/ Discetomy;  Surgeon: Melina Schools, MD;  Location: [place];  Service: Orthopedics;  Laterality: N/A;  . SKIN GRAFT    . TONSILLECTOMY    . TUBAL LIGATION  1983  . VULVECTOMY N/A 01/28/2015   Procedure: WIDE EXCISION VULVECTOMY;  Surgeon: Thurnell Lose, MD;  Location: [facility];  Service: Gynecology;  Laterality: N/A;   Social History   Occupational History  . Occupation: ACCOUNTING    Employer: [employer]  Tobacco Use  . Smoking status: Former Smoker    Packs/day: 1.00    Years: 40.00    Pack years: 40.00    Types: Cigarettes    Last attempt to quit: 02/05/2007    Years since quitting: 11.6  . Smokeless tobacco: Never Used  Substance and Sexual Activity  . Alcohol use: Yes    Alcohol/week: 0.0 standard drinks    Comment: socially  . Drug use: No  . Sexual activity: Not on file

## 2018-10-11 DIAGNOSIS — E785 Hyperlipidemia, unspecified: Secondary | ICD-10-CM | POA: Diagnosis not present

## 2018-10-11 DIAGNOSIS — F325 Major depressive disorder, single episode, in full remission: Secondary | ICD-10-CM | POA: Diagnosis not present

## 2018-10-11 DIAGNOSIS — M810 Age-related osteoporosis without current pathological fracture: Secondary | ICD-10-CM | POA: Diagnosis not present

## 2018-10-11 DIAGNOSIS — G459 Transient cerebral ischemic attack, unspecified: Secondary | ICD-10-CM | POA: Diagnosis not present

## 2018-10-11 DIAGNOSIS — I10 Essential (primary) hypertension: Secondary | ICD-10-CM | POA: Diagnosis not present

## 2018-10-11 DIAGNOSIS — F329 Major depressive disorder, single episode, unspecified: Secondary | ICD-10-CM | POA: Diagnosis not present

## 2018-10-18 ENCOUNTER — Ambulatory Visit (INDEPENDENT_AMBULATORY_CARE_PROVIDER_SITE_OTHER): Payer: Medicare Other | Admitting: Internal Medicine

## 2018-10-18 ENCOUNTER — Other Ambulatory Visit: Payer: Self-pay

## 2018-10-18 ENCOUNTER — Encounter: Payer: Self-pay | Admitting: Internal Medicine

## 2018-10-18 VITALS — BP 108/60 | HR 91 | Temp 98.1°F | Ht 67.0 in | Wt 187.0 lb

## 2018-10-18 DIAGNOSIS — I2510 Atherosclerotic heart disease of native coronary artery without angina pectoris: Secondary | ICD-10-CM | POA: Diagnosis not present

## 2018-10-18 DIAGNOSIS — J45991 Cough variant asthma: Secondary | ICD-10-CM

## 2018-10-18 DIAGNOSIS — I2584 Coronary atherosclerosis due to calcified coronary lesion: Secondary | ICD-10-CM | POA: Diagnosis not present

## 2018-10-18 MED ORDER — MOMETASONE FURO-FORMOTEROL FUM 100-5 MCG/ACT IN AERO: 2.0000 | INHALATION_SPRAY | Freq: Two times a day (BID) | RESPIRATORY_TRACT | 0 refills | Status: DC

## 2018-10-18 MED ORDER — MOMETASONE FURO-FORMOTEROL FUM 100-5 MCG/ACT IN AERO: 2.0000 | INHALATION_SPRAY | Freq: Two times a day (BID) | RESPIRATORY_TRACT | 11 refills | Status: DC

## 2018-10-18 NOTE — Assessment & Plan Note (Addendum)
Onset Aug 2015 spradic, severe, not seasonal - allergy profile 05/28/2014 > No eos, IgE 424 POS RAST only dust and mold  -  Sinus/chest  CT 06/03/2014 > wnl  - Trial of singulair 06/30/2014 >>>  ? Benefit  - 2019 proved intolerant of ACEi  - 10/18/2018  After extensive coaching inhaler device,  effectiveness =  75%> try dulera 100 2bid and short term max gerd rx/ no mints    Reported response to both albuterol and prednisone strongly suggestive of asthma although the intolerance of ACE inhibitors is more suggestive of Upper airway cough syndrome (previously labeled PNDS),  is so named because it's frequently impossible to sort out how much is  CR/sinusitis with freq throat clearing (which can be related to primary GERD)   vs  causing  secondary (" extra esophageal")  GERD from wide swings in gastric pressure that occur with throat clearing, often  promoting self use of mint and menthol lozenges that reduce the lower esophageal sphincter tone and exacerbate the problem further in a cyclical fashion.   These are the same pts (now being labeled as having "irritable larynx syndrome" by some cough centers) who not infrequently have a history of having failed to tolerate ace inhibitors,  dry powder inhalers or biphosphonates or report having atypical/extraesophageal reflux symptoms that don't respond to standard doses of PPI  and are easily confused as having aecopd or asthma flares by even experienced allergists/ pulmonologists (myself included).    Many patients have elements of both syndromes and therefore I am going to recommend she be treated for both at the same time as follows:  Try dulera 100 2bid  Continue singulair for now For PNDS > 1st gen H1 blockers per guidelines   For sob/wheeze > saba   prn    Also,  Important to remember Of the three most common causes of  Sub-acute / recurrent or chronic cough, only one (GERD)  can actually contribute to/ trigger  the other two (asthma and post nasal  drip syndrome)  and perpetuate the cylce of cough.  While not intuitively obvious, many patients with chronic low grade reflux do not cough until there is a primary insult that disturbs the protective epithelial barrier and exposes sensitive nerve endings.   This is typically viral but can due to PNDS and  either may apply here.      >>>  The point is that once this occurs, it is difficult to eliminate the cycle  using anything but a maximally effective acid suppression regimen at least in the short run, accompanied by an appropriate diet to address non acid GERD and use non-mint/menthol hard rock candies to suppress urge to cough/ add tessalon if needed but none of the above may be necessary once we have her on a stable maintenance regimen.  Follow-up will be in 6 weeks with all meds in hand using a trust but verify approach to confirm accurate Medication  Reconciliation The principal here is that until we are certain that the  patients are doing what we've asked, it makes no sense to ask them to do more.     Total time devoted to counseling  > 50 % of initial 60 min office visit:  review case with pt / device teaching which extended face to face time for this visit / discussion of options/alternatives/ personally creating written customized instructions  in presence of pt  then going over those specific  Instructions directly with the pt including how  to use all of the meds but in particular covering each new medication in detail and the difference between the maintenance= "automatic" meds and the prns using an action plan format for the latter (If this problem/symptom => do that organization reading Left to right).  Please see AVS from this visit for a full list of these instructions which I personally wrote for this pt and  are unique to this visit.

## 2018-10-18 NOTE — Progress Notes (Signed)
Subjective:    Patient ID: Shelly [name], female    DOB: 10-21-1950       MRN: [id number]    Brief patient profile:  32 yowf daughter of optimetrist  quit smoking 2008  watery eyes in her 60s year round ? Worse in winter eventually started on zyrtec 2013 and seemed to help s assoc cough/ wheeze then new onset cough Aug 2015 and proved refractory so referred to pulmonary clinic 05/15/14  by Dr Shelly [name]    History of Present Illness  05/15/2014 [facility] office visit/ Shelly [name]   Chief Complaint  Patient presents with  . Pulmonary Consult    Referred by Dr. Carol [name]. Pt c/o cough since August 2015- cough is non prod and somtimes seems worse at night. She also c/o DOE with walking up stairs.    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   Prednisone 10 mg take  4 each am x 2 days,   2 each am x 2 days,  1 each am x 2 days and stop (this is to eliminate allergies and inflammation from coughing) Omeprazole 40 mg  Take 30-60 min before first meal of the day and Pepcid 20 mg one bedtime plus chlorpheniramine 4 mg x 2 at bedtime  GERD diet reviewed     05/28/2014 f/u ov/Shelly [name] re: cough since aug 2015 Chief Complaint  Patient presents with  . Acute Visit    Pt states that after last visit her cough had pretty much resolved, but then started back about 1 wk ago.  Cough is non prod and "comes and goes".   95% better, still urge to  clear throat daytime on above rx/ confused about when to d/c what  No noct cough  at all  Sob doe x steps better Then gradually worse x one week , again dry and variable   Kouffman Reflux v Neurogenic Cough Differentiator Reflux Comments  Do you awaken from a sound sleep coughing violently?                            With trouble breathing? Not now   Do you have choking episodes when you cannot  Get enough air, gasping for air ?              Not now   Do you usually cough when you lie down into  The bed, or when you just lie down to rest ?                          Not now    Do you usually cough after meals or eating?         No   Do you cough when (or after) you bend over?    Still some  GERD SCORE     Kouffman Reflux v Neurogenic Cough Differentiator Neurogenic   Do you more-or-less cough all day long? no   Does change of temperature make you cough? no   Does laughing or chuckling cause you to cough? Still some   Do fumes (perfume, automobile fumes, burned  Toast, etc.,) cause you to cough ?      no   Does speaking, singing, or talking on the phone cause you to cough   ?               Not now   Neurogenic/Airway score       rec Prednisone 10 mg take  4 each am x 2 days,   2 each am x 2 days,  1 each am x 2 days and stop  Take Tessalon 4x daily  and supplement if needed with  tramadol 50 mg up to 2 every 4 hours to suppress the urge to cough. Swallowing water or using ice chips/non mint and menthol containing candies (such as lifesavers or sugarless jolly ranchers) are also effective.  You should rest your voice and avoid activities that you know make you cough. Once you have eliminated the cough for 3 straight days try reducing the tramadol first,  then the tessalon as tolerated Please see patient coordinator before you leave today  to schedule sinus and chest CT and fluoro > neg        06/11/2014 f/u ov/Shelly [name] re: cough sine aug 2015 / abn cxr Chief Complaint  Patient presents  with  . Follow-up    Pt states that her cough has resolved. No new co's today.   No longer needing any tramadol at all  Omeprazole 20 x 2 Take 30-60 min before first meal of the day and pepcid 20 mg one at bedtime For drainage take chlortrimeton (chlorpheniramine) 4 mg every 4 hours available over the counter (may cause drowsiness)  If  need anything stronger than delsym for cough please return     06/30/2014 f/u ov/Shelly [name] re: recurrent cough since Aug 2015 while on maint rx with ppi/hs h1  Chief Complaint  Patient presents with  . Follow-up    Pt c/o cough x 1 wk- "not as bad as it was before".  Cough is prod occ with minimal clear sputum.   some cough waking her w/in 4 h of hs  But Mostly coughing daytime, no worse in cold air, with urge to clear the throat which had completely cleared p pred rx, some better with chlorpheniramine vs zyrtec but nose really never quit running. Delsym not working any more  rec Prednisone 10 mg take  4 each am x 2 days,   2 each am x 2 days,  1 each am x 2 days and stop  Take Tessalon 4x daily  and supplement if needed with  tramadol 50 mg up to 2 every 4 hours to suppress the urge to cough. Swallowing water or using ice chips/non mint and menthol containing candies (such as lifesavers or sugarless jolly ranchers) are also effective.  You should rest your voice and avoid activities that you know make you cough. Once you have eliminated the cough for 3 straight days try reducing the tramadol first,  then the tessalon as tolerated Singulair(montelukast) 10 mg one daily in pm Next step would a methacholine challenge test - call Shelly [name] at [phone] if not better> never done     10/18/2018   Pulmonary/ re-estabish/Shelly [name] re:  Tendency  to cough sporadic pattern x years, recurrent since March 2020 Chief Complaint  Patient presents with  . Pulmonary Consult    Self referral.  Pt states has had increased cough x 2 months.  Cough is currently non prod. She has not had to  use her proair in the past wk.   when better was maint on singulair,  h1 x 2 hs and no gerd rx then acutely worse x 2 months s obvious trigger Typically year round patter is that every few weeks the cough starts back up  Dyspnea:  MMRC1 = even on best edays  can walk nl pace, flat grade, can't hurry or go uphills or steps s sob   Cough: mostly dry / gag ? Sometime vomit when cough is severe and "almost pass out"  Sleeping: bed is flat / cough was bad at night better now  SABA use: saba helped as does prednisone  02: 02  Using lots of mints  acei intol ? When  d/c'd (started summer 2019)     No obvious patterns in day to day or daytime variability or assoc excess/ purulent sputum or mucus plugs or hemoptysis or cp or chest tightness, subjective wheeze or overt sinus or hb symptoms.   Sleeping now  without nocturnal  or early am exacerbation  of respiratory  c/o's or need for noct saba. Also denies any obvious fluctuation of symptoms with weather or environmental changes or other aggravating or alleviating factors except as outlined above   No unusual exposure hx or h/o childhood pna/ asthma or knowledge of premature birth.  Current Allergies, Complete Past Medical History, Past Surgical History, Family History, and Social History were reviewed in [employer] record.  ROS  The following are not active complaints unless bolded Hoarseness, sore throat, dysphagia, dental problems, itching, sneezing,  nasal congestion or discharge of excess mucus or purulent secretions, ear ache,   fever, chills, sweats, unintended wt loss or wt gain, classically pleuritic or exertional cp,  orthopnea pnd or arm/hand swelling  or leg swelling, presyncope, palpitations, abdominal pain, anorexia, nausea, vomiting, diarrhea  or change in bowel habits or change in bladder habits, change in stools or change in urine, dysuria, hematuria,  rash, arthralgias, visual complaints, headache, numbness,  weakness or ataxia or problems with walking or coordination,  change in mood or  memory.        Current Meds  Medication Sig  . acetaminophen (TYLENOL) 650 MG CR tablet Take 650 mg every 8 (eight) hours as needed by mouth for pain.  Marland Kitchen aspirin 325 MG tablet Take 162 mg by mouth daily.   . benzonatate (TESSALON) 200 MG capsule Take 200 mg by mouth 3 (three) times daily as needed for cough.  Marland Kitchen buPROPion (WELLBUTRIN XL) 300 MG 24 hr tablet Take 300 mg by mouth daily.    . chlorpheniramine (CHLOR-TRIMETON) 4 MG tablet Take 8 mg by mouth at bedtime.  . Cholecalciferol (VITAMIN D PO) Take 5,000 Units by mouth daily.  . Cyanocobalamin (B-12 PO) Take 5,000 Units by mouth daily.  Marland Kitchen denosumab (PROLIA) 60 MG/ML SOLN injection Inject 60 mg into the skin every 6 (six) months. Administer in upper arm, thigh, or abdomen Last dose was given in August 2017 Next dose due February 2017 Given at Dr Freida Busman [facility] office  . Esomeprazole Magnesium (NEXIUM PO) Take 20 mg by mouth as needed.   Marland Kitchen FLUoxetine (PROZAC) 20 MG capsule Take 20 mg by mouth daily.  Marland Kitchen losartan (  COZAAR) 50 MG tablet Take 1 tablet by mouth daily.  . montelukast (SINGULAIR) 10 MG tablet Take 1 tablet (10 mg total) by mouth at bedtime.  . Multiple Vitamins-Minerals (CENTRUM SILVER PO) Take 1 tablet by mouth daily.    Marland Kitchen PROAIR HFA 108 (90 Base) MCG/ACT inhaler INHALE 1 TO 2 PUFFS EVERY 6 HOURS AS NEEDED FOR WHEEZING OR COUGH  . rosuvastatin (CRESTOR) 20 MG tablet Take 20 mg by mouth daily.  . traMADol (ULTRAM) 50 MG tablet Take 50 mg by mouth daily as needed for severe pain.   . valACYclovir (VALTREX) 1000 MG tablet Take 1,000 mg by mouth daily as needed.                      Objective:   Physical Exam  Pleasant amb wf  05/28/2014      208 >  06/11/14  206 >   06/30/2014 207  > 10/18/2018  187  Wt Readings from Last 3 Encounters:  05/15/14 207 lb (93.895 kg)  04/29/13 193 lb (87.544 kg)  09/08/10 195 lb (88.451 kg)         HEENT: nl  dentition, turbinates bilaterally, and oropharynx which is pristine. Nl external ear canals without cough reflex   NECK :  without JVD/Nodes/TM/ nl carotid upstrokes bilaterally   LUNGS: no acc muscle use,  Nl contour chest which is clear to A and P bilaterally with cough late  exp maneuvers   CV:  RRR  no s3 or murmur or increase in P2, and no edema   ABD:  soft and nontender with nl inspiratory excursion in the supine position. No bruits or organomegaly appreciated, bowel sounds nl  MS:  Nl gait/ ext warm with R BKA/wearing prosthesis  calf tenderness, cyanosis or clubbing No obvious joint restrictions   SKIN: warm and dry without lesions    NEURO:  alert, approp, nl sensorium with  no motor or cerebellar deficits apparent.        Epic review:  No recent  cxr on file        Assessment & Plan:

## 2018-10-18 NOTE — Patient Instructions (Addendum)
Plan A = Automatic =Dulera 100 Take 2 puffs first thing in am and then another 2 puffs about 12 hours later and continue singulair 10 mg each pm   Work on inhaler technique:  relax and gently blow all the way out then take a nice smooth deep breath back in, triggering the inhaler at same time you start breathing in.  Hold for up to 5 seconds if you can. Blow out thru nose. Rinse and gargle with water when done     Plan B = Backup Only use your albuterol inhaler as a rescue medication to be used if you can't catch your breath by resting or doing a relaxed purse lip breathing pattern.  - The less you use it, the better it will work when you need it. - Ok to use the inhaler up to 2 puffs  every 4 hours if you must but call for appointment if use goes up over your usual need - Don't leave home without it !!  (think of it like the spare tire for your car)    Whenever cough start  prilosec 20 mg Take 30- 60 min before your first and last meals of the day until no longer coughing and  For drainage / throat tickle try take CHLORPHENIRAMINE  4 mg  (Chlortab 4mg   at [employer] should be easiest to find in the green box)  take one every 4 hours as needed - available over the counter- may cause drowsiness so start with just a bedtime dose or two and see how you tolerate it before trying in daytime     Please schedule a follow up office visit in 6 weeks, call sooner if needed  - cxr on return if still coughing

## 2018-10-28 ENCOUNTER — Encounter: Payer: Self-pay | Admitting: Orthopedic Surgery

## 2018-10-30 NOTE — Telephone Encounter (Signed)
Change to dulera 100 2bid

## 2018-10-30 NOTE — Telephone Encounter (Signed)
Error:  Change to symbicort 80 Take 2 puffs first thing in am and then another 2 puffs about 12 hours later.

## 2018-10-30 NOTE — Telephone Encounter (Signed)
Dr. Melvyn Novas, please see patient's mychart messages in regards to the [facility] and please advise on this for pt. Please also see where she has stated alternatives with her insurance company too. Thanks!

## 2018-10-31 MED ORDER — BUDESONIDE-FORMOTEROL FUMARATE 80-4.5 MCG/ACT IN AERO: 2.0000 | INHALATION_SPRAY | Freq: Two times a day (BID) | RESPIRATORY_TRACT | 5 refills | Status: DC

## 2018-11-05 ENCOUNTER — Telehealth: Payer: Self-pay | Admitting: Internal Medicine

## 2018-11-05 NOTE — Telephone Encounter (Signed)
PA started for Symbicort 80 with [facility]. Per Juliann Pulse Symbicort is a tier exception and most likely will not be approved. Approval will be faxed/mailed to both office and patient. Nothing further needed at this time.

## 2018-11-13 DIAGNOSIS — M546 Pain in thoracic spine: Secondary | ICD-10-CM | POA: Diagnosis not present

## 2018-11-19 DIAGNOSIS — H25813 Combined forms of age-related cataract, bilateral: Secondary | ICD-10-CM | POA: Diagnosis not present

## 2018-11-19 DIAGNOSIS — H35372 Puckering of macula, left eye: Secondary | ICD-10-CM | POA: Diagnosis not present

## 2018-11-20 DIAGNOSIS — M5136 Other intervertebral disc degeneration, lumbar region: Secondary | ICD-10-CM | POA: Diagnosis not present

## 2018-11-26 ENCOUNTER — Ambulatory Visit: Payer: Medicare Other | Admitting: Internal Medicine

## 2018-11-26 ENCOUNTER — Telehealth: Payer: Self-pay | Admitting: Internal Medicine

## 2018-11-26 NOTE — Telephone Encounter (Signed)
She should cancel ov with me and contact her pcp for eval for the diarrhea as it is a possible covid 19 side effect and we can't see pts with active suspcious  Symptoms especially if  unexplained and not  a pulmonary source

## 2018-11-26 NOTE — Telephone Encounter (Signed)
Called spoke with patient, advised of MW's recommendations to cancel her upcoming appt and contact her PCP regarding her diarrhea.  Advised patient to call the office back once her symptoms are resolved and we can rescheduled her appt.  Patient voiced her understanding and denied ant questions/concerns.  Upcoming appt cancelled. Nothing further needed; will sign off.

## 2018-11-26 NOTE — Telephone Encounter (Signed)
Called and spoke with pt who stated she has had diarrhea x5 days. Pt denies any complaints of fever, has not done any recent traveling, has not been tested for COVID, and has not been around anyone that has either been sick or exposed to [place].  Due to pt having an appt with MW this afternoon 6/22 at 2pm and with pt now having the diarrhea x5 days, pt wants to know what needs to be done in regards to her appt and symptoms. Dr. Melvyn Novas, please advise on this for pt. Thanks!  Instructions  Plan A = Automatic =Dulera 100 Take 2 puffs first thing in am and then another 2 puffs about 12 hours later and continue singulair 10 mg each pm   Work on inhaler technique:  relax and gently blow all the way out then take a nice smooth deep breath back in, triggering the inhaler at same time you start breathing in.  Hold for up to 5 seconds if you can. Blow out thru nose. Rinse and gargle with water when done     Plan B = Backup Only use your albuterol inhaler as a rescue medication to be used if you can't catch your breath by resting or doing a relaxed purse lip breathing pattern.  - The less you use it, the better it will work when you need it. - Ok to use the inhaler up to 2 puffs  every 4 hours if you must but call for appointment if use goes up over your usual need - Don't leave home without it !!  (think of it like the spare tire for your car)    Whenever cough start  prilosec 20 mg Take 30- 60 min before your first and last meals of the day until no longer coughing and  For drainage / throat tickle try take CHLORPHENIRAMINE  4 mg  (Chlortab 4mg   at [employer] should be easiest to find in the green box)  take one every 4 hours as needed - available over the counter- may cause drowsiness so start with just a bedtime dose or two and see how you tolerate it before trying in daytime     Please schedule a follow up office visit in 6 weeks, call sooner if needed  - cxr on return if still coughing

## 2018-11-28 ENCOUNTER — Other Ambulatory Visit: Payer: Self-pay | Admitting: Pediatric Intensive Care

## 2018-11-28 DIAGNOSIS — Z20822 Contact with and (suspected) exposure to covid-19: Secondary | ICD-10-CM

## 2018-11-29 ENCOUNTER — Ambulatory Visit: Payer: Medicare Other | Admitting: Internal Medicine

## 2018-11-30 DIAGNOSIS — F329 Major depressive disorder, single episode, unspecified: Secondary | ICD-10-CM | POA: Diagnosis not present

## 2018-11-30 DIAGNOSIS — F325 Major depressive disorder, single episode, in full remission: Secondary | ICD-10-CM | POA: Diagnosis not present

## 2018-11-30 DIAGNOSIS — G459 Transient cerebral ischemic attack, unspecified: Secondary | ICD-10-CM | POA: Diagnosis not present

## 2018-11-30 DIAGNOSIS — I10 Essential (primary) hypertension: Secondary | ICD-10-CM | POA: Diagnosis not present

## 2018-11-30 DIAGNOSIS — M810 Age-related osteoporosis without current pathological fracture: Secondary | ICD-10-CM | POA: Diagnosis not present

## 2018-11-30 DIAGNOSIS — E785 Hyperlipidemia, unspecified: Secondary | ICD-10-CM | POA: Diagnosis not present

## 2018-12-02 LAB — NOVEL CORONAVIRUS, NAA: SARS-CoV-2, NAA: NOT DETECTED

## 2018-12-03 DIAGNOSIS — H25812 Combined forms of age-related cataract, left eye: Secondary | ICD-10-CM | POA: Diagnosis not present

## 2018-12-03 NOTE — Telephone Encounter (Signed)
Emailed pt back ands offered an OV.

## 2018-12-03 NOTE — Telephone Encounter (Signed)
Plan was to see her back in 6 weeks which is coming up next week then decide what she needed for ongoing rx - based on her last ov there are too many loose ends to says she is appropriate for surgery at this point  rec ov with all meds next week - ok to use a Thursday pm slot if needed

## 2018-12-03 NOTE — Telephone Encounter (Signed)
Patient needs surgical clearance.  Had appointment on 11/26/18 that she cancelled due to diarrhea x5 days.  She tested negative on 11/28/18 for Covid.  Dr Melvyn Novas, when would you feel comfortable having her in the office for the surgical clearance?

## 2018-12-04 ENCOUNTER — Telehealth: Payer: Self-pay

## 2018-12-04 NOTE — Telephone Encounter (Signed)
   [facility] Pre-operative Risk Assessment    Request for surgical clearance:  1. What type of surgery is being performed? Spinal Cord Stimulator Placement   2. When is this surgery scheduled? TBD   3. What type of clearance is required (medical clearance vs. Pharmacy clearance to hold med vs. Both)? Medical  4. Are there any medications that need to be held prior to surgery and how long? None listed   5. Practice name and name of physician performing surgery? Dr. Melina Schools at [facility]   6. What is your office phone number? [phone]    7.   What is your office fax number? [phone] Attn: Orson Slick  8.   Anesthesia type (None, local, MAC, general) ? General

## 2018-12-04 NOTE — Telephone Encounter (Signed)
Dr Melvyn Novas, this message was sent to you this afternoon.  Hi Dr. Melvyn Novas,  I am going to have spinal cord stimulator  surgery on August 5.  Dr.  Rolena Infante is preforming the surgery.  He has asked me to give you the attached release, so that he may move forward.  Dr. Rolena Infante said it needed to dated within a 30 day window of the surgery.  Dr. Rolena Infante is at [facility].  Thank you for your assistance! Shelly [name] 08/18/2050  [phone] Attachments  Surg Clearance [place]    Message routed to Dr Melvyn Novas  LOV 10/18/18  Instructions  Plan A = Automatic =Dulera 100 Take 2 puffs first thing in am and then another 2 puffs about 12 hours later and continue singulair 10 mg each pm   Work on inhaler technique:  relax and gently blow all the way out then take a nice smooth deep breath back in, triggering the inhaler at same time you start breathing in.  Hold for up to 5 seconds if you can. Blow out thru nose. Rinse and gargle with water when done     Plan B = Backup Only use your albuterol inhaler as a rescue medication to be used if you can't catch your breath by resting or doing a relaxed purse lip breathing pattern.  - The less you use it, the better it will work when you need it. - Ok to use the inhaler up to 2 puffs  every 4 hours if you must but call for appointment if use goes up over your usual need - Don't leave home without it !!  (think of it like the spare tire for your car)    Whenever cough start  prilosec 20 mg Take 30- 60 min before your first and last meals of the day until no longer coughing and  For drainage / throat tickle try take CHLORPHENIRAMINE  4 mg  (Chlortab 4mg   at [employer] should be easiest to find in the green box)  take one every 4 hours as needed - available over the counter- may cause drowsiness so start with just a bedtime dose or two and see how you tolerate it before trying in daytime     Please schedule a follow up office visit in 6 weeks, call sooner  if needed  - cxr on return if still coughing

## 2018-12-04 NOTE — Telephone Encounter (Signed)
   Primary Cardiologist: Larae Grooms, MD  Chart reviewed as part of pre-operative protocol coverage. Patient was contacted 12/04/2018 in reference to pre-operative risk assessment for pending surgery as outlined below.  Shelly [name] was last seen on 06/11/18 by Dr. Irish Lack.  Since that day, PAULINA MUCHMORE has done well.  She has a BKA and has a prosthesis. She has a lift chair to go up the stairs, but states she can climb the stairs if she is not carrying anything. She just got a new puppy and is walking her dog four times daily. It sounds as though she can complete 4.0 METS without anginal symptoms. She had a negative stress test in 2016. She is mainly limited by her back pain.   Therefore, based on ACC/AHA guidelines, the patient would be at acceptable risk for the planned procedure without further cardiovascular testing.   I will route this recommendation to the requesting party via Epic fax function and remove from pre-op pool.  Please call with questions.  Tami Lin Vanette Noguchi, PA 12/04/2018, 8:55 AM

## 2018-12-11 ENCOUNTER — Telehealth: Payer: Self-pay | Admitting: Internal Medicine

## 2018-12-11 NOTE — Telephone Encounter (Signed)
Spoke with patient. She is aware of MW recs, stated that she was not around anyone but family and no one has COVID.   Nothing further needed at time of call.

## 2018-12-11 NOTE — Telephone Encounter (Signed)
Unless there was exposure to known case she should be fine keeping appt with mask  In place

## 2018-12-11 NOTE — Telephone Encounter (Signed)
Called and spoke with pt who stated she went to [place] 7/3 due to her daughter having a baby and she returned back home to [place] 7/6.  Pt stated she stayed at her daughter's house to be at home with her grandchildren while her daughter was in the hospital having the baby.  Pt stated her daughter made her home quarantine for 14 days prior to going down to [place] to stay with her grandchildren as her daughter is a PA and wanted do make sure pt did not become sick prior to needing to go to [place].  Pt is scheduled for a follow up appt with MW tomorrow, 7/8 and is needing to know if it is okay for her to keep appt or if she needs to reschedule. Dr. Melvyn Novas, please advise on this for pt. Thanks!

## 2018-12-12 ENCOUNTER — Other Ambulatory Visit: Payer: Self-pay

## 2018-12-12 ENCOUNTER — Ambulatory Visit (INDEPENDENT_AMBULATORY_CARE_PROVIDER_SITE_OTHER): Payer: Medicare Other | Admitting: Internal Medicine

## 2018-12-12 ENCOUNTER — Encounter: Payer: Self-pay | Admitting: Internal Medicine

## 2018-12-12 ENCOUNTER — Ambulatory Visit (INDEPENDENT_AMBULATORY_CARE_PROVIDER_SITE_OTHER): Payer: Medicare Other

## 2018-12-12 DIAGNOSIS — R9389 Abnormal findings on diagnostic imaging of other specified body structures: Secondary | ICD-10-CM | POA: Diagnosis not present

## 2018-12-12 DIAGNOSIS — R05 Cough: Secondary | ICD-10-CM | POA: Diagnosis not present

## 2018-12-12 DIAGNOSIS — I2510 Atherosclerotic heart disease of native coronary artery without angina pectoris: Secondary | ICD-10-CM

## 2018-12-12 DIAGNOSIS — I2584 Coronary atherosclerosis due to calcified coronary lesion: Secondary | ICD-10-CM | POA: Diagnosis not present

## 2018-12-12 DIAGNOSIS — J45991 Cough variant asthma: Secondary | ICD-10-CM

## 2018-12-12 NOTE — Patient Instructions (Addendum)
Please remember to go to the  x-ray department  for your tests - we will call you with the results when they are available .   No change in medication - ok to stop singulair after surgery   Remember for any flare of cough > add back nexium 20mg  Take 30- 60 min before your first and last meals of the day x until better for at least a week   Please schedule a follow up visit in 3 months but call sooner if needed

## 2018-12-12 NOTE — Assessment & Plan Note (Signed)
Onset Aug 2015 spradic, severe, not seasonal - allergy profile 05/28/2014 > No eos, IgE 424 POS RAST only dust and mold  -  Sinus/chest  CT 06/03/2014 > wnl  - Trial of singulair 06/30/2014 >>>  ? Benefit  - 2019 proved intolerant of ACEi  - 10/18/2018  After extensive coaching inhaler device,  effectiveness =  75%> try dulera 100 2bid and 1st gen H1 blockers per guidelines    no mints > 100% resolution s gerd rx needed    All goals of chronic asthma control met including optimal function and elimination of symptoms with minimal need for rescue therapy.  Contingencies discussed in full including contacting this office immediately if not controlling the symptoms using the rule of two's.     >>>  Ok to try off singulair p surgery

## 2018-12-12 NOTE — Progress Notes (Signed)
Subjective:    Patient ID: Shelly [name], female    DOB: 1950/07/02       MRN: [id number]    Brief patient profile:  15 yowf daughter of optimetrist  quit smoking 2008  watery eyes in her 25s year round ? Worse in winter eventually started on zyrtec 2013 and seemed to help s assoc cough/ wheeze then new onset cough Aug 2015 and proved refractory so referred to pulmonary clinic 05/15/14  by Dr Jeremy Johann    History of Present Illness  05/15/2014 1st [facility] office visit/ Shelly [name]   Chief Complaint  Patient presents with  . Pulmonary Consult    Referred by Dr. Carol Ada. Pt c/o cough since August 2015- cough is non prod and somtimes seems worse at night. She also c/o DOE with walking up stairs.    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   Prednisone 10 mg take  4 each am x 2 days,   2 each am x 2 days,  1 each am x 2 days and stop (this is to eliminate allergies and inflammation from coughing) Omeprazole 40 mg  Take 30-60 min before first meal of the day and Pepcid 20 mg one bedtime plus chlorpheniramine 4 mg x 2 at bedtime  GERD diet reviewed     05/28/2014 f/u ov/Shelly [name] re: cough since aug 2015 Chief Complaint  Patient presents with  . Acute Visit    Pt states that after last visit her cough had pretty much resolved, but then started back about 1 wk ago.  Cough is non prod and "comes and goes".   95% better, still urge to  clear throat daytime on above rx/ confused about when to d/c what  No noct cough  at all  Sob doe x steps better Then gradually worse x one week , again dry and variable   Kouffman Reflux v Neurogenic Cough Differentiator Reflux Comments  Do you awaken from a sound sleep coughing violently?                            With trouble breathing? Not now   Do you have choking episodes when you cannot  Get enough air, gasping for air ?              Not now   Do you usually cough when you lie down into  The bed, or when you just lie down to rest ?                          Not now    Do you usually cough after meals or eating?         No   Do you cough when (or after) you bend over?    Still some  GERD SCORE     Kouffman Reflux v Neurogenic Cough Differentiator Neurogenic   Do you more-or-less cough all day long? no   Does change of temperature make you cough? no   Does laughing or chuckling cause you to cough? Still some   Do fumes (perfume, automobile fumes, burned  Toast, etc.,) cause you to cough ?      no   Does speaking, singing, or talking on the phone cause you to cough   ?               Not now   Neurogenic/Airway score       rec Prednisone 10 mg take  4 each am x 2 days,   2 each am x 2 days,  1 each am x 2 days and stop  Take Tessalon 4x daily  and supplement if needed with  tramadol 50 mg up to 2 every 4 hours to suppress the urge to cough. Swallowing water or using ice chips/non mint and menthol containing candies (such as lifesavers or sugarless jolly ranchers) are also effective.  You should rest your voice and avoid activities that you know make you cough. Once you have eliminated the cough for 3 straight days try reducing the tramadol first,  then the tessalon as tolerated Please see patient coordinator before you leave today  to schedule sinus and chest CT and fluoro > neg       10/18/2018   Pulmonary/ re-estabish/Shelly [name] re:  Tendency to cough sporadic pattern x years,  recurrent since March 2020 while on singulair  Chief Complaint  Patient presents with  . Pulmonary Consult    Self referral.  Pt states has had increased cough x 2 months.  Cough is currently non prod. She has not had to use her proair in the past wk.     was maint on singulair,  h1 x 2 hs and no gerd rx then acutely worse x 2 months s obvious trigger Typically year round pattern is that every few weeks the cough starts back up  Dyspnea:  MMRC1 = even on best edays  can walk nl pace, flat grade, can't hurry or go uphills or steps s sob   Cough: mostly dry / gag ? Sometime vomit when cough is severe and "almost pass out"  Sleeping: bed is flat / cough was bad at night better now  SABA use: saba helped as does prednisone  02: 02  Using lots of mints  acei intol ? When  d/c'd (started summer 2019)  rec Plan A = Automatic =Dulera 100 Take 2 puffs first thing in am and then another 2 puffs about 12 hours later and continue singulair 10 mg each pm  Work on inhaler technique:   Plan B = Backup Only use your albuterol inhaler as a rescue medication  Whenever cough start  prilosec 20 mg Take 30- 60 min before your first and last meals of the day until no longer coughing and  For drainage / throat tickle try take CHLORPHENIRAMINE  4 mg  (Chlortab 4mg   at [employer] should be easiest to find in the green box)  take one every 4 hours as needed - available over the counter- may cause drowsiness so start with just a bedtime dose or two and see how you tolerate it before trying in daytime      12/12/2018  f/u ov/Shelly [name] re:  Cough resolved on dulera 100 > symb 80 /4.5 plus singulair plus prn  h1   Chief Complaint  Patient presents with  . Follow-up    Breathing is doing well and no new co's today. She has not been using her albuterol inhaler.   Dyspnea:  MMRC1 = can walk nl pace, flat grade, can't hurry or go uphills or steps s sob   Cough: none Sleeping: no resp symptoms  SABA use: none 02: none     No obvious day to day or daytime variability or assoc excess/ purulent sputum or mucus plugs or hemoptysis or cp or chest tightness, subjective wheeze or overt sinus or hb symptoms.   Sleeping well  without nocturnal  or early am exacerbation  of respiratory  c/o's or need for noct saba. Also denies any obvious fluctuation of symptoms with weather or environmental changes or other aggravating or alleviating factors except as outlined above   No unusual exposure hx or h/o childhood pna/ asthma or knowledge of premature birth.  Current Allergies, Complete Past Medical History, Past Surgical History, Family History, and Social History were reviewed in [employer] record.  ROS  The following are not active complaints unless bolded Hoarseness, sore throat, dysphagia, dental problems, itching, sneezing,  nasal congestion or discharge of excess mucus or purulent secretions, ear ache,   fever, chills, sweats, unintended wt loss or wt gain, classically pleuritic or exertional cp,  orthopnea pnd or arm/hand swelling  or leg swelling, presyncope, palpitations, abdominal pain, anorexia, nausea, vomiting, diarrhea  or change in bowel habits or change in bladder habits, change in stools or change in urine, dysuria, hematuria,  rash, arthralgias, visual complaints, headache, numbness, weakness or ataxia or problems with walking or coordination,  change in mood or  memory.        Current Meds  Medication Sig  . acetaminophen (TYLENOL) 650 MG CR tablet Take 650 mg every 8 (eight) hours as needed by mouth for pain.  Marland Kitchen aspirin 325 MG tablet Take 162 mg by mouth daily.   . budesonide-formoterol (SYMBICORT) 80-4.5 MCG/ACT inhaler Inhale 2 puffs into the lungs 2 (two) times daily.  Marland Kitchen buPROPion (WELLBUTRIN XL) 300 MG 24 hr tablet Take 300 mg by mouth daily.    . chlorpheniramine (CHLOR-TRIMETON) 4 MG tablet Take 8 mg by mouth at bedtime.  . Cholecalciferol (VITAMIN D PO) Take 5,000 Units by  mouth daily.  . Cyanocobalamin (B-12 PO) Take 5,000 Units by mouth daily.  Marland Kitchen denosumab (PROLIA) 60 MG/ML SOLN injection Inject 60 mg into the skin every 6 (six) months. Administer in upper arm, thigh, or abdomen Last dose was given in August 2017 Next dose due February 2017 Given at Dr Freida Busman [facility] office  . Esomeprazole Magnesium (NEXIUM PO) Take 20 mg by mouth as needed.   Marland Kitchen FLUoxetine (PROZAC) 20 MG capsule Take 20 mg by mouth daily.  Marland Kitchen losartan (COZAAR) 50 MG tablet Take 1 tablet by mouth daily.  . montelukast (SINGULAIR) 10 MG tablet Take 1 tablet (10 mg total) by mouth at bedtime.  . Multiple Vitamins-Minerals (CENTRUM SILVER PO) Take 1 tablet by mouth daily.    Marland Kitchen PROAIR HFA 108 (90 Base) MCG/ACT inhaler INHALE 1 TO 2 PUFFS EVERY 6 HOURS AS NEEDED FOR WHEEZING OR COUGH  . rosuvastatin (CRESTOR) 20 MG tablet Take 20 mg by mouth daily.  . traMADol (ULTRAM) 50 MG tablet Take 50 mg by mouth daily as needed for severe pain.   . valACYclovir (VALTREX) 1000 MG tablet Take 1,000 mg by mouth daily as needed.  Objective:  Physical Exam  [facility] w[facility] nad    12/12/2018    188  05/28/2014      208 >  06/11/14  206 >   06/30/2014 207  > 10/18/2018  187           05/15/14 207 lb (93.895 kg)  04/29/13 193 lb (87.544 kg)  09/08/10 195 lb (88.451 kg)     Vital signs reviewed - Note on arrival 02 sats  96% on RA     HEENT: nl dentition, turbinates bilaterally, and oropharynx. Nl external ear canals without cough reflex   NECK :  without JVD/Nodes/TM/ nl carotid upstrokes bilaterally   LUNGS: no acc muscle use,  Nl contour chest which is clear to A and P bilaterally without cough on insp or exp maneuvers   CV:  RRR  no s3 or murmur or increase in P2, and no edema   ABD:  soft and nontender with nl inspiratory excursion in the supine position. No bruits or organomegaly appreciated, bowel sounds nl  MS:  Nl gait/ ext warm with  R BKA/ prosthesis ,  calf tenderness, cyanosis or clubbing No obvious joint restrictions   SKIN: warm and dry without lesions    NEURO:  alert, approp, nl sensorium with  no motor or cerebellar deficits apparent.          CXR PA and Lateral:   12/12/2018 :    I personally reviewed images and agree with radiology impression as follows:   No active cardiopulmonary disease. My impression L HD elevation is unchanged and most c/w partial eventration      Assessment & Plan:

## 2018-12-12 NOTE — Assessment & Plan Note (Addendum)
See CXR 05/15/14 with elevated L HD vs nl 11/08/10  - CT chest 06/03/2014 > neg  - Fluoro 06/03/14 > poor excursion bilaterally, neg paradox  R works better than L  - cxr 12/12/2018 no change L HD elevation   Not a concern for [place]  surgery though increased risk post op atx can be offset by early mobilization/ IS/ min narcs and bipap/ pulmonary f/u prn   Discussed in detail all the  indications, usual  risks and alternatives  relative to the benefits with patient who understands/ accepts risks > cleared for surgery

## 2018-12-13 ENCOUNTER — Telehealth: Payer: Self-pay | Admitting: Internal Medicine

## 2018-12-13 NOTE — Telephone Encounter (Signed)
Notes recorded by Tanda Rockers, MD on 12/13/2018 at 5:26 AM EDT  Call pt: Reviewed cxr and no acute change so no change in recommendations made at [facility] and spoke with pt letting her know the results of the xray and that MW said no changes to be made in recommendations that were stated at [facility]. Pt verbalized understanding.  Pt wanted to make sure MW was going to take care of filling out the surgical clearance form and send it back to Dr. Rolena Infante with [facility] in regards to her surgery. Pt is scheduled to have surgery August 5 and they need the form filled out and sent over as soon as possible.    Routing to MW as Juluis Rainier.

## 2018-12-27 DIAGNOSIS — H25812 Combined forms of age-related cataract, left eye: Secondary | ICD-10-CM | POA: Diagnosis not present

## 2018-12-28 DIAGNOSIS — H25812 Combined forms of age-related cataract, left eye: Secondary | ICD-10-CM | POA: Diagnosis not present

## 2018-12-28 DIAGNOSIS — I10 Essential (primary) hypertension: Secondary | ICD-10-CM | POA: Diagnosis not present

## 2018-12-28 DIAGNOSIS — H259 Unspecified age-related cataract: Secondary | ICD-10-CM | POA: Diagnosis not present

## 2018-12-28 DIAGNOSIS — J45909 Unspecified asthma, uncomplicated: Secondary | ICD-10-CM | POA: Diagnosis not present

## 2019-01-01 NOTE — Progress Notes (Signed)
[facility] [place], [address] AT [facility] OF [address] [place] [address] Phone: [phone] Fax: [phone]  [facility] - [place], [place] - [address] [place] [address] Phone: [phone] Fax: [phone]    Your procedure is scheduled on Thursday, August 6th  Report to [facility] Main Entrance "A" at  12:45 P.M., and check in at the Admitting office.  Call this number if you have problems the morning of surgery:  [phone]  Call [phone] if you have any questions prior to your surgery date Monday-Friday 8am-4pm   Remember:  Do not eat or drink after midnight the night before your surgery    Take these medicines the morning of surgery with A SIP OF WATER Besifloxacin HCl -eye drops Bromfenac Sodium (PROLENSA OP) -eye drops budesonide-formoterol (SYMBICORT) - inhaler buPROPion (WELLBUTRIN XL)  chlorpheniramine (CHLOR-TRIMETON) Difluprednate (DUREZOL OP) - eye drops esomeprazole (NEXIUM) FLUoxetine (PROZAC)   If needed - acetaminophen (TYLENOL), valACYclovir (VALTREX)  Follow your surgeon's instructions on when to stop Aspirin.  If no instructions were given by your surgeon then you will need to call the office to get those instructions.    7 days prior to surgery STOP taking any Aspirin (unless otherwise instructed by your surgeon), Aleve, Naproxen, Ibuprofen, Motrin, Advil, Goody's, BC's, all herbal medications, fish oil, and all vitamins.   The Morning of Surgery  Do not wear jewelry, make-up or nail polish.  Do not wear lotions, powders, or perfumes/colognes, or deodorant  Do not shave 48 hours prior to surgery.   Do not bring valuables to the hospital.  [facility] is not responsible for any belongings or valuables.  If you are a smoker, DO NOT Smoke 24 hours prior to surgery IF you wear a CPAP at night please bring your mask,  tubing, and machine the morning of surgery   Remember that you must have someone to transport you home after your surgery, and remain with you for 24 hours if you are discharged the same day.  Contacts, glasses, hearing aids, dentures or bridgework may not be worn into surgery.   Leave your suitcase in the car.  After surgery it may be brought to your room.  For patients admitted to the hospital, discharge time will be determined by your treatment team.  Patients discharged the day of surgery will not be allowed to drive home.   Special instructions:   [place]- Preparing For Surgery  Before surgery, you can play an important role. Because skin is not sterile, your skin needs to be as free of germs as possible. You can reduce the number of germs on your skin by washing with CHG (chlorahexidine gluconate) Soap before surgery.  CHG is an antiseptic cleaner which kills germs and bonds with the skin to continue killing germs even after washing.    Oral Hygiene is also important to reduce your risk of infection.  Remember - BRUSH YOUR TEETH THE MORNING OF SURGERY WITH YOUR REGULAR TOOTHPASTE  Please do not use if you have an allergy to CHG or antibacterial soaps. If your skin becomes reddened/irritated stop using the CHG.  Do not shave (including legs and underarms) for at least 48 hours prior to first CHG shower. It is OK to shave your face.  Please follow these instructions carefully.   1. Shower the NIGHT BEFORE SURGERY and the MORNING OF SURGERY with CHG Soap.  2. If you chose to wash your hair, wash your hair first as usual with your normal shampoo.  3. After you shampoo, rinse your hair and body thoroughly to remove the shampoo.  4. Use CHG as you would any other liquid soap. You can apply CHG directly to the skin and wash gently with a scrungie or a clean washcloth.   5. Apply the CHG Soap to your body ONLY FROM THE NECK DOWN.  Do not use on open wounds or open sores. Avoid  contact with your eyes, ears, mouth and genitals (private parts). Wash Face and genitals (private parts)  with your normal soap.   6. Wash thoroughly, paying special attention to the area where your surgery will be performed.  7. Thoroughly rinse your body with warm water from the neck down.  8. DO NOT shower/wash with your normal soap after using and rinsing off the CHG Soap.  9. Pat yourself dry with a CLEAN TOWEL.  10. Wear CLEAN PAJAMAS to bed the night before surgery, wear comfortable clothes the morning of surgery  11. Place CLEAN SHEETS on your bed the night of your first shower and DO NOT SLEEP WITH PETS.  Day of Surgery:  Do not apply any deodorants/lotions. Please shower the morning of surgery with the CHG soap  Please wear clean clothes to the hospital/surgery center.   Remember to brush your teeth WITH YOUR REGULAR TOOTHPASTE.  Please read over the following fact sheets that you were given.

## 2019-01-02 ENCOUNTER — Encounter (HOSPITAL_COMMUNITY)
Admission: RE | Admit: 2019-01-02 | Discharge: 2019-01-02 | Disposition: A | Discharge status: Home / Self Care | Payer: Medicare Other | Source: Ambulatory Visit | Attending: Orthopedic Surgery | Admitting: Orthopedic Surgery

## 2019-01-02 ENCOUNTER — Ambulatory Visit (HOSPITAL_COMMUNITY)
Admission: RE | Admit: 2019-01-02 | Discharge: 2019-01-02 | Disposition: A | Discharge status: Home / Self Care | Payer: Medicare Other | Source: Ambulatory Visit | Attending: Orthopedic Surgery | Admitting: Orthopedic Surgery

## 2019-01-02 ENCOUNTER — Encounter (HOSPITAL_COMMUNITY): Payer: Self-pay

## 2019-01-02 ENCOUNTER — Other Ambulatory Visit: Payer: Self-pay

## 2019-01-02 ENCOUNTER — Ambulatory Visit: Payer: Self-pay | Admitting: Orthopedic Surgery

## 2019-01-02 DIAGNOSIS — Z01818 Encounter for other preprocedural examination: Secondary | ICD-10-CM | POA: Insufficient documentation

## 2019-01-02 DIAGNOSIS — J8410 Pulmonary fibrosis, unspecified: Secondary | ICD-10-CM | POA: Diagnosis not present

## 2019-01-02 DIAGNOSIS — S299XXA Unspecified injury of thorax, initial encounter: Secondary | ICD-10-CM | POA: Diagnosis not present

## 2019-01-02 HISTORY — DX: Unspecified asthma, uncomplicated: J45.909

## 2019-01-02 LAB — URINALYSIS, ROUTINE W REFLEX MICROSCOPIC
Bacteria, UA: NONE SEEN
Bilirubin Urine: NEGATIVE
Glucose, UA: NEGATIVE mg/dL
Ketones, ur: NEGATIVE mg/dL
Nitrite: NEGATIVE
Protein, ur: NEGATIVE mg/dL
Specific Gravity, Urine: 1.008 (ref 1.005–1.030)
pH: 5 (ref 5.0–8.0)

## 2019-01-02 LAB — PROTIME-INR
INR: 1 (ref 0.8–1.2)
Prothrombin Time: 13.1 seconds (ref 11.4–15.2)

## 2019-01-02 LAB — SURGICAL PCR SCREEN
MRSA, PCR: NEGATIVE
Staphylococcus aureus: NEGATIVE

## 2019-01-02 LAB — CBC
HCT: 37.8 % (ref 36.0–46.0)
Hemoglobin: 12 g/dL (ref 12.0–15.0)
MCH: 30.7 pg (ref 26.0–34.0)
MCHC: 31.7 g/dL (ref 30.0–36.0)
MCV: 96.7 fL (ref 80.0–100.0)
Platelets: 308 10*3/uL (ref 150–400)
RBC: 3.91 MIL/uL (ref 3.87–5.11)
RDW: 12.6 % (ref 11.5–15.5)
WBC: 8.7 10*3/uL (ref 4.0–10.5)
nRBC: 0 % (ref 0.0–0.2)

## 2019-01-02 LAB — APTT: aPTT: 30 seconds (ref 24–36)

## 2019-01-02 LAB — BASIC METABOLIC PANEL
Anion gap: 9 (ref 5–15)
BUN: 18 mg/dL (ref 8–23)
CO2: 23 mmol/L (ref 22–32)
Calcium: 8.8 mg/dL — ABNORMAL LOW (ref 8.9–10.3)
Chloride: 105 mmol/L (ref 98–111)
Creatinine, Ser: 1.01 mg/dL — ABNORMAL HIGH (ref 0.44–1.00)
GFR calc Af Amer: >60 mL/min (ref >60)
GFR calc non Af Amer: 56 mL/min — ABNORMAL LOW (ref >60)
Glucose, Bld: 88 mg/dL (ref 70–99)
Potassium: 4.3 mmol/L (ref 3.5–5.1)
Sodium: 137 mmol/L (ref 135–145)

## 2019-01-02 NOTE — Progress Notes (Signed)
PCP - Dr. Carol Ada Cardiologist -  Dr. Larae Grooms  Pulmonlogist -  Dr. Christinia Gully  Chest x-ray - 01/02/2019 EKG - 01/31/18 - requested [place] Test - 07/2014 ECHO - 11/10/17 Cardiac Cath - denies  Sleep Study - denies CPAP - N/A  Blood Thinner Instructions: N/A Aspirin Instructions: per patient "no instructions given". Patient instructed to call surgeon's office for further instructions  Anesthesia review: Yes, MRI 2018 ??  Coronavirus Screening  Have you experienced the following symptoms:  Cough yes/no: No Fever (>100.94F)  yes/no: No Runny nose yes/no: No Sore throat yes/no: No Difficulty breathing/shortness of breath  yes/no: No  Have you or a family member traveled in the last 14 days and where? yes/no: No  If the patient indicates "YES" to the above questions, their PAT will be rescheduled to limit the exposure to others and, the surgeon will be notified. THE PATIENT WILL NEED TO BE ASYMPTOMATIC FOR 14 DAYS.   If the patient is not experiencing any of these symptoms, the PAT nurse will instruct them to NOT bring anyone with them to their appointment since they may have these symptoms or traveled as well.   Please remind your patients and families that hospital visitation restrictions are in effect and the importance of the restrictions.   Patient denies shortness of breath, fever, cough and chest pain at PAT appointment  Patient verbalized understanding of instructions that were given to them at the PAT appointment. Patient was also instructed that they will need to review over the PAT instructions again at home before surgery.

## 2019-01-03 NOTE — Anesthesia Preprocedure Evaluation (Addendum)
Anesthesia Evaluation  Patient identified by MRN, date of birth, ID band Patient awake    Reviewed: Allergy & Precautions, NPO status , Patient's Chart, lab work & pertinent test results  History of Anesthesia Complications (+) PONV and history of anesthetic complications  Airway Mallampati: II  TM Distance: >3 FB Neck ROM: Full    Dental  (+) Edentulous Upper   Pulmonary asthma , former smoker,    Pulmonary exam normal        Cardiovascular hypertension, Pt. on medications Normal cardiovascular exam     Neuro/Psych Anxiety Depression Chronic back pain    GI/Hepatic Neg liver ROS, GERD  ,  Endo/Other  negative endocrine ROS  Renal/GU negative Renal ROS     Musculoskeletal  (+) Arthritis , Fibromyalgia -Right BKA   Abdominal   Peds  Hematology negative hematology ROS (+)   Anesthesia Other Findings Day of surgery medications reviewed with the patient.  Reproductive/Obstetrics                           Anesthesia Physical Anesthesia Plan  ASA: II  Anesthesia Plan: General   Post-op Pain Management:    Induction: Intravenous  PONV Risk Score and Plan: 4 or greater and Treatment may vary due to age or medical condition, Ondansetron, Dexamethasone, Midazolam and Propofol infusion  Airway Management Planned: Oral ETT  Additional Equipment: None  Intra-op Plan:   Post-operative Plan: Extubation in OR  Informed Consent: I have reviewed the patients History and Physical, chart, labs and discussed the procedure including the risks, benefits and alternatives for the proposed anesthesia with the patient or authorized representative who has indicated his/her understanding and acceptance.     Dental advisory given  Plan Discussed with: CRNA  Anesthesia Plan Comments: (Follows with Dr. Melvyn Novas for cough variant asthma. Cleared for surgery per 12/12/18 OV note "All goals of chronic asthma  control met including optimal function and elimination of symptoms with minimal need for rescue therapy.Marland KitchenMarland KitchenMarland KitchenNot a concern for [place]  surgery though increased risk post op atx can be offset by early mobilization/ IS/ min narcs and bipap/ pulmonary f/u prn."  Coronary calcium noted in 2015. She was asymptomatic and then had a stress test that was negative. She continues to follows with cardiology for risk factor modification. Cleared via telephone encounter 12/04/18 "MARYALYCE SANJUAN was last seen on 06/11/18 by Dr. Irish Lack.  Since that day, ELIZA GREEN has done well.  She has a BKA and has a prosthesis. She has a lift chair to go up the stairs, but states she can climb the stairs if she is not carrying anything. She just got a new puppy and is walking her dog four times daily. It sounds as though she can complete 4.0 METS without anginal symptoms. She had a negative stress test in 2016. She is mainly limited by her back pain. Therefore, based on ACC/AHA guidelines, the patient would be at acceptable risk for the planned procedure without further cardiovascular testing."  EKG 01/31/18 (copy on pt chart): Sinus rhythm with 1st degree AV block. Rate 80.  TTE 11/10/17: - Left ventricle: The cavity size was normal. Wall thickness was   increased in a pattern of mild LVH. Systolic function was normal.   The estimated ejection fraction was in the range of 60% to 65%.   Wall motion was normal; there were no regional wall motion   abnormalities. Doppler parameters are consistent with abnormal  left ventricular relaxation (grade 1 diastolic dysfunction). The   E/e&' ratio is between 8-15, suggesting indeterminate LV filling   pressure. - Mitral valve: Mildly thickened leaflets . There was trivial   regurgitation. - Left atrium: The atrium was normal in size. - Inferior vena cava: The vessel was normal in size. The   respirophasic diameter changes were in the normal range (>= 50%),   consistent with normal central  venous pressure.  Impressions:  - LVEF 60-65%, mild LVH, normal wall motion, grade 1 DD,   indeterminate LV filling pressure, trivial MR, normal LA size,   normal IVC.  Exercise stress test 07/10/14:  -Denies any CP during entire test. - Functional capacity above average esp consider pt has R BKA wear prosthesis - there are no ST or T wave changes to suggest ischemia)     Anesthesia Quick Evaluation

## 2019-01-04 DIAGNOSIS — M545 Low back pain: Secondary | ICD-10-CM | POA: Diagnosis not present

## 2019-01-04 DIAGNOSIS — M5136 Other intervertebral disc degeneration, lumbar region: Secondary | ICD-10-CM | POA: Diagnosis not present

## 2019-01-04 DIAGNOSIS — M47896 Other spondylosis, lumbar region: Secondary | ICD-10-CM | POA: Diagnosis not present

## 2019-01-07 ENCOUNTER — Other Ambulatory Visit (HOSPITAL_COMMUNITY)
Admission: RE | Admit: 2019-01-07 | Discharge: 2019-01-07 | Disposition: A | Discharge status: Home / Self Care | Payer: Medicare Other | Source: Ambulatory Visit | Attending: Orthopedic Surgery | Admitting: Orthopedic Surgery

## 2019-01-07 DIAGNOSIS — Z20828 Contact with and (suspected) exposure to other viral communicable diseases: Secondary | ICD-10-CM | POA: Insufficient documentation

## 2019-01-07 DIAGNOSIS — Z01812 Encounter for preprocedural laboratory examination: Secondary | ICD-10-CM | POA: Insufficient documentation

## 2019-01-07 LAB — SARS CORONAVIRUS 2 (TAT 6-24 HRS): SARS Coronavirus 2: NEGATIVE

## 2019-01-10 ENCOUNTER — Ambulatory Visit (HOSPITAL_COMMUNITY): Payer: Medicare Other

## 2019-01-10 ENCOUNTER — Ambulatory Visit (HOSPITAL_COMMUNITY): Payer: Medicare Other | Admitting: Physician Assistant

## 2019-01-10 ENCOUNTER — Ambulatory Visit (HOSPITAL_COMMUNITY): Payer: Medicare Other | Admitting: Anesthesiology

## 2019-01-10 ENCOUNTER — Encounter (HOSPITAL_COMMUNITY)
Admission: RE | Disposition: A | Discharge status: Home / Self Care | Payer: Self-pay | Source: Home / Self Care | Attending: Orthopedic Surgery

## 2019-01-10 ENCOUNTER — Encounter (HOSPITAL_COMMUNITY): Payer: Self-pay | Admitting: *Deleted

## 2019-01-10 ENCOUNTER — Observation Stay (HOSPITAL_COMMUNITY)
Admission: RE | Admit: 2019-01-10 | Discharge: 2019-01-11 | Disposition: A | Discharge status: Home / Self Care | Payer: Medicare Other | Attending: Orthopedic Surgery | Admitting: Orthopedic Surgery

## 2019-01-10 ENCOUNTER — Other Ambulatory Visit: Payer: Self-pay

## 2019-01-10 DIAGNOSIS — R269 Unspecified abnormalities of gait and mobility: Secondary | ICD-10-CM | POA: Diagnosis not present

## 2019-01-10 DIAGNOSIS — R0789 Other chest pain: Secondary | ICD-10-CM | POA: Insufficient documentation

## 2019-01-10 DIAGNOSIS — J029 Acute pharyngitis, unspecified: Secondary | ICD-10-CM | POA: Diagnosis not present

## 2019-01-10 DIAGNOSIS — I700 Atherosclerosis of aorta: Secondary | ICD-10-CM | POA: Diagnosis not present

## 2019-01-10 DIAGNOSIS — Z87891 Personal history of nicotine dependence: Secondary | ICD-10-CM | POA: Diagnosis not present

## 2019-01-10 DIAGNOSIS — M5136 Other intervertebral disc degeneration, lumbar region: Secondary | ICD-10-CM | POA: Insufficient documentation

## 2019-01-10 DIAGNOSIS — F418 Other specified anxiety disorders: Secondary | ICD-10-CM | POA: Diagnosis not present

## 2019-01-10 DIAGNOSIS — R1030 Lower abdominal pain, unspecified: Secondary | ICD-10-CM | POA: Insufficient documentation

## 2019-01-10 DIAGNOSIS — J45909 Unspecified asthma, uncomplicated: Secondary | ICD-10-CM | POA: Diagnosis not present

## 2019-01-10 DIAGNOSIS — F329 Major depressive disorder, single episode, unspecified: Secondary | ICD-10-CM | POA: Insufficient documentation

## 2019-01-10 DIAGNOSIS — Z23 Encounter for immunization: Secondary | ICD-10-CM | POA: Insufficient documentation

## 2019-01-10 DIAGNOSIS — Z89511 Acquired absence of right leg below knee: Secondary | ICD-10-CM | POA: Diagnosis not present

## 2019-01-10 DIAGNOSIS — E785 Hyperlipidemia, unspecified: Secondary | ICD-10-CM | POA: Insufficient documentation

## 2019-01-10 DIAGNOSIS — R079 Chest pain, unspecified: Secondary | ICD-10-CM | POA: Diagnosis not present

## 2019-01-10 DIAGNOSIS — M545 Low back pain: Secondary | ICD-10-CM | POA: Diagnosis not present

## 2019-01-10 DIAGNOSIS — M1990 Unspecified osteoarthritis, unspecified site: Secondary | ICD-10-CM | POA: Insufficient documentation

## 2019-01-10 DIAGNOSIS — R413 Other amnesia: Secondary | ICD-10-CM | POA: Insufficient documentation

## 2019-01-10 DIAGNOSIS — R05 Cough: Secondary | ICD-10-CM | POA: Diagnosis not present

## 2019-01-10 DIAGNOSIS — Z419 Encounter for procedure for purposes other than remedying health state, unspecified: Secondary | ICD-10-CM

## 2019-01-10 DIAGNOSIS — Z8544 Personal history of malignant neoplasm of other female genital organs: Secondary | ICD-10-CM | POA: Diagnosis not present

## 2019-01-10 DIAGNOSIS — I10 Essential (primary) hypertension: Secondary | ICD-10-CM | POA: Insufficient documentation

## 2019-01-10 DIAGNOSIS — M792 Neuralgia and neuritis, unspecified: Secondary | ICD-10-CM | POA: Diagnosis not present

## 2019-01-10 DIAGNOSIS — M549 Dorsalgia, unspecified: Secondary | ICD-10-CM | POA: Diagnosis not present

## 2019-01-10 DIAGNOSIS — M797 Fibromyalgia: Secondary | ICD-10-CM | POA: Diagnosis not present

## 2019-01-10 DIAGNOSIS — M961 Postlaminectomy syndrome, not elsewhere classified: Principal | ICD-10-CM | POA: Insufficient documentation

## 2019-01-10 DIAGNOSIS — Z7982 Long term (current) use of aspirin: Secondary | ICD-10-CM | POA: Insufficient documentation

## 2019-01-10 DIAGNOSIS — R1010 Upper abdominal pain, unspecified: Secondary | ICD-10-CM | POA: Diagnosis not present

## 2019-01-10 DIAGNOSIS — Z9682 Presence of neurostimulator: Secondary | ICD-10-CM | POA: Diagnosis not present

## 2019-01-10 DIAGNOSIS — G894 Chronic pain syndrome: Secondary | ICD-10-CM | POA: Diagnosis not present

## 2019-01-10 DIAGNOSIS — M4726 Other spondylosis with radiculopathy, lumbar region: Secondary | ICD-10-CM | POA: Insufficient documentation

## 2019-01-10 DIAGNOSIS — Z79899 Other long term (current) drug therapy: Secondary | ICD-10-CM | POA: Insufficient documentation

## 2019-01-10 DIAGNOSIS — K219 Gastro-esophageal reflux disease without esophagitis: Secondary | ICD-10-CM | POA: Insufficient documentation

## 2019-01-10 DIAGNOSIS — G8918 Other acute postprocedural pain: Secondary | ICD-10-CM | POA: Diagnosis not present

## 2019-01-10 DIAGNOSIS — D7389 Other diseases of spleen: Secondary | ICD-10-CM | POA: Diagnosis not present

## 2019-01-10 DIAGNOSIS — M5412 Radiculopathy, cervical region: Secondary | ICD-10-CM | POA: Insufficient documentation

## 2019-01-10 DIAGNOSIS — J8410 Pulmonary fibrosis, unspecified: Secondary | ICD-10-CM | POA: Diagnosis not present

## 2019-01-10 DIAGNOSIS — Z8249 Family history of ischemic heart disease and other diseases of the circulatory system: Secondary | ICD-10-CM | POA: Insufficient documentation

## 2019-01-10 DIAGNOSIS — M79606 Pain in leg, unspecified: Secondary | ICD-10-CM | POA: Insufficient documentation

## 2019-01-10 DIAGNOSIS — Z981 Arthrodesis status: Secondary | ICD-10-CM | POA: Diagnosis not present

## 2019-01-10 DIAGNOSIS — F419 Anxiety disorder, unspecified: Secondary | ICD-10-CM | POA: Insufficient documentation

## 2019-01-10 DIAGNOSIS — Z7951 Long term (current) use of inhaled steroids: Secondary | ICD-10-CM | POA: Insufficient documentation

## 2019-01-10 DIAGNOSIS — G8929 Other chronic pain: Secondary | ICD-10-CM | POA: Diagnosis present

## 2019-01-10 HISTORY — PX: SPINAL CORD STIMULATOR INSERTION: SHX5378

## 2019-01-10 SURGERY — INSERTION, SPINAL CORD STIMULATOR, LUMBAR
Anesthesia: General

## 2019-01-10 MED ORDER — TRANEXAMIC ACID-NACL 1000-0.7 MG/100ML-% IV SOLN
INTRAVENOUS | Status: AC
  Filled 2019-01-10: qty 100

## 2019-01-10 MED ORDER — MIDAZOLAM HCL 2 MG/2ML IJ SOLN
INTRAMUSCULAR | Status: DC | PRN
  Administered 2019-01-10: 2 mg via INTRAVENOUS

## 2019-01-10 MED ORDER — ACETAMINOPHEN 10 MG/ML IV SOLN: 1000.0000 mg | Freq: Once | INTRAVENOUS | Status: DC | PRN

## 2019-01-10 MED ORDER — PROPOFOL 10 MG/ML IV BOLUS
INTRAVENOUS | Status: AC
  Filled 2019-01-10: qty 20

## 2019-01-10 MED ORDER — LOSARTAN POTASSIUM 50 MG PO TABS
50.0000 mg | ORAL_TABLET | Freq: Every day | ORAL | Status: DC
  Filled 2019-01-10: qty 1

## 2019-01-10 MED ORDER — EPHEDRINE SULFATE 50 MG/ML IJ SOLN
INTRAMUSCULAR | Status: DC | PRN
  Administered 2019-01-10: 10 mg via INTRAVENOUS

## 2019-01-10 MED ORDER — BUPROPION HCL ER (XL) 300 MG PO TB24: 300.0000 mg | ORAL_TABLET | Freq: Every day | ORAL | Status: DC

## 2019-01-10 MED ORDER — FENTANYL CITRATE (PF) 100 MCG/2ML IJ SOLN
INTRAMUSCULAR | Status: AC
  Filled 2019-01-10: qty 2

## 2019-01-10 MED ORDER — ACETAMINOPHEN 650 MG RE SUPP: 650.0000 mg | RECTAL | Status: DC | PRN

## 2019-01-10 MED ORDER — FENTANYL CITRATE (PF) 100 MCG/2ML IJ SOLN
25.0000 ug | INTRAMUSCULAR | Status: DC | PRN
  Administered 2019-01-10 (×4): 25 ug via INTRAVENOUS

## 2019-01-10 MED ORDER — OXYCODONE HCL 5 MG PO TABS: 5.0000 mg | ORAL_TABLET | Freq: Once | ORAL | Status: DC | PRN

## 2019-01-10 MED ORDER — MENTHOL 3 MG MT LOZG
1.0000 | LOZENGE | OROMUCOSAL | Status: DC | PRN
  Administered 2019-01-10: 3 mg via ORAL
  Filled 2019-01-10: qty 9

## 2019-01-10 MED ORDER — POLYETHYLENE GLYCOL 3350 17 G PO PACK: 17.0000 g | PACK | Freq: Every day | ORAL | Status: DC | PRN

## 2019-01-10 MED ORDER — BUPIVACAINE-EPINEPHRINE (PF) 0.25% -1:200000 IJ SOLN
INTRAMUSCULAR | Status: AC
  Filled 2019-01-10: qty 30

## 2019-01-10 MED ORDER — DEXAMETHASONE SODIUM PHOSPHATE 10 MG/ML IJ SOLN
INTRAMUSCULAR | Status: AC
  Filled 2019-01-10: qty 1

## 2019-01-10 MED ORDER — PHENYLEPHRINE HCL (PRESSORS) 10 MG/ML IV SOLN
INTRAVENOUS | Status: DC | PRN
  Administered 2019-01-10 (×2): 80 ug via INTRAVENOUS
  Administered 2019-01-10 (×2): 120 ug via INTRAVENOUS

## 2019-01-10 MED ORDER — ROCURONIUM BROMIDE 50 MG/5ML IV SOSY
PREFILLED_SYRINGE | INTRAVENOUS | Status: DC | PRN
  Administered 2019-01-10: 50 mg via INTRAVENOUS

## 2019-01-10 MED ORDER — OXYCODONE-ACETAMINOPHEN 10-325 MG PO TABS: 1.0000 | ORAL_TABLET | Freq: Four times a day (QID) | ORAL | 0 refills | Status: AC | PRN

## 2019-01-10 MED ORDER — RISAQUAD PO CAPS
1.0000 | ORAL_CAPSULE | Freq: Every day | ORAL | Status: DC
  Administered 2019-01-10: 1 via ORAL
  Filled 2019-01-10 (×2): qty 1

## 2019-01-10 MED ORDER — STERILE WATER FOR IRRIGATION IR SOLN
Status: DC | PRN
  Administered 2019-01-10: 1000 mL

## 2019-01-10 MED ORDER — PROPOFOL 500 MG/50ML IV EMUL
INTRAVENOUS | Status: DC | PRN
  Administered 2019-01-10: 50 ug/kg/min via INTRAVENOUS

## 2019-01-10 MED ORDER — PROPOFOL 10 MG/ML IV BOLUS
INTRAVENOUS | Status: DC | PRN
  Administered 2019-01-10: 130 mg via INTRAVENOUS
  Administered 2019-01-10: 20 mg via INTRAVENOUS

## 2019-01-10 MED ORDER — EPHEDRINE 5 MG/ML INJ
INTRAVENOUS | Status: AC
  Filled 2019-01-10: qty 10

## 2019-01-10 MED ORDER — SODIUM CHLORIDE 0.9 % IV SOLN
INTRAVENOUS | Status: DC | PRN
  Administered 2019-01-10: 25 ug/min via INTRAVENOUS

## 2019-01-10 MED ORDER — CEFAZOLIN SODIUM-DEXTROSE 1-4 GM/50ML-% IV SOLN
1.0000 g | Freq: Three times a day (TID) | INTRAVENOUS | Status: AC
  Administered 2019-01-10 – 2019-01-11 (×2): 1 g via INTRAVENOUS
  Filled 2019-01-10 (×2): qty 50

## 2019-01-10 MED ORDER — ONDANSETRON HCL 4 MG/2ML IJ SOLN
INTRAMUSCULAR | Status: AC
  Filled 2019-01-10: qty 2

## 2019-01-10 MED ORDER — MORPHINE SULFATE (PF) 2 MG/ML IV SOLN: 2.0000 mg | INTRAVENOUS | Status: DC | PRN

## 2019-01-10 MED ORDER — MONTELUKAST SODIUM 10 MG PO TABS
10.0000 mg | ORAL_TABLET | Freq: Every day | ORAL | Status: DC
  Administered 2019-01-10: 10 mg via ORAL
  Filled 2019-01-10: qty 1

## 2019-01-10 MED ORDER — OXYCODONE HCL 5 MG PO TABS
5.0000 mg | ORAL_TABLET | ORAL | Status: DC | PRN
  Administered 2019-01-11: 5 mg via ORAL
  Filled 2019-01-10: qty 1

## 2019-01-10 MED ORDER — BUPIVACAINE-EPINEPHRINE 0.25% -1:200000 IJ SOLN
INTRAMUSCULAR | Status: DC | PRN
  Administered 2019-01-10: 10 mL

## 2019-01-10 MED ORDER — LIDOCAINE 2% (20 MG/ML) 5 ML SYRINGE
INTRAMUSCULAR | Status: DC | PRN
  Administered 2019-01-10 (×2): 40 mg via INTRAVENOUS

## 2019-01-10 MED ORDER — SODIUM CHLORIDE 0.9% FLUSH: 3.0000 mL | INTRAVENOUS | Status: DC | PRN

## 2019-01-10 MED ORDER — METHOCARBAMOL 500 MG PO TABS
500.0000 mg | ORAL_TABLET | Freq: Four times a day (QID) | ORAL | Status: DC | PRN
  Administered 2019-01-10 – 2019-01-11 (×3): 500 mg via ORAL
  Filled 2019-01-10 (×3): qty 1

## 2019-01-10 MED ORDER — OXYCODONE HCL 5 MG PO TABS
ORAL_TABLET | ORAL | Status: AC
  Filled 2019-01-10: qty 2

## 2019-01-10 MED ORDER — OXYCODONE HCL 5 MG PO TABS
10.0000 mg | ORAL_TABLET | ORAL | Status: DC | PRN
  Administered 2019-01-10 (×2): 10 mg via ORAL
  Filled 2019-01-10: qty 2

## 2019-01-10 MED ORDER — ACETAMINOPHEN 325 MG PO TABS
650.0000 mg | ORAL_TABLET | ORAL | Status: DC | PRN
  Administered 2019-01-11: 650 mg via ORAL
  Filled 2019-01-10: qty 2

## 2019-01-10 MED ORDER — CEFAZOLIN SODIUM-DEXTROSE 2-4 GM/100ML-% IV SOLN
INTRAVENOUS | Status: AC
  Filled 2019-01-10: qty 100

## 2019-01-10 MED ORDER — ONDANSETRON HCL 4 MG PO TABS: 4.0000 mg | ORAL_TABLET | Freq: Three times a day (TID) | ORAL | 0 refills | Status: DC | PRN

## 2019-01-10 MED ORDER — SUGAMMADEX SODIUM 200 MG/2ML IV SOLN
INTRAVENOUS | Status: DC | PRN
  Administered 2019-01-10: 167.2 mg via INTRAVENOUS

## 2019-01-10 MED ORDER — LACTATED RINGERS IV SOLN
INTRAVENOUS | Status: DC
  Administered 2019-01-10: 16:00:00 via INTRAVENOUS
  Administered 2019-01-10: 1000 mL via INTRAVENOUS

## 2019-01-10 MED ORDER — SODIUM CHLORIDE 0.9% FLUSH: 3.0000 mL | Freq: Two times a day (BID) | INTRAVENOUS | Status: DC

## 2019-01-10 MED ORDER — FENTANYL CITRATE (PF) 250 MCG/5ML IJ SOLN
INTRAMUSCULAR | Status: DC | PRN
  Administered 2019-01-10: 100 ug via INTRAVENOUS
  Administered 2019-01-10 (×4): 25 ug via INTRAVENOUS
  Administered 2019-01-10: 50 ug via INTRAVENOUS

## 2019-01-10 MED ORDER — PHENYLEPHRINE 40 MCG/ML (10ML) SYRINGE FOR IV PUSH (FOR BLOOD PRESSURE SUPPORT)
PREFILLED_SYRINGE | INTRAVENOUS | Status: AC
  Filled 2019-01-10: qty 10

## 2019-01-10 MED ORDER — METHOCARBAMOL 1000 MG/10ML IJ SOLN
500.0000 mg | Freq: Four times a day (QID) | INTRAVENOUS | Status: DC | PRN
  Filled 2019-01-10 (×2): qty 5

## 2019-01-10 MED ORDER — MIDAZOLAM HCL 2 MG/2ML IJ SOLN
INTRAMUSCULAR | Status: AC
  Filled 2019-01-10: qty 2

## 2019-01-10 MED ORDER — PROMETHAZINE HCL 25 MG/ML IJ SOLN: 6.2500 mg | INTRAMUSCULAR | Status: DC | PRN

## 2019-01-10 MED ORDER — ONDANSETRON HCL 4 MG/2ML IJ SOLN
INTRAMUSCULAR | Status: DC | PRN
  Administered 2019-01-10: 4 mg via INTRAVENOUS

## 2019-01-10 MED ORDER — METHOCARBAMOL 500 MG PO TABS: 500.0000 mg | ORAL_TABLET | Freq: Three times a day (TID) | ORAL | 0 refills | Status: AC | PRN

## 2019-01-10 MED ORDER — CEFAZOLIN SODIUM-DEXTROSE 2-4 GM/100ML-% IV SOLN
2.0000 g | INTRAVENOUS | Status: AC
  Administered 2019-01-10: 2 g via INTRAVENOUS

## 2019-01-10 MED ORDER — ONDANSETRON HCL 4 MG PO TABS: 4.0000 mg | ORAL_TABLET | Freq: Four times a day (QID) | ORAL | Status: DC | PRN

## 2019-01-10 MED ORDER — FENTANYL CITRATE (PF) 250 MCG/5ML IJ SOLN
INTRAMUSCULAR | Status: AC
  Filled 2019-01-10: qty 5

## 2019-01-10 MED ORDER — LACTATED RINGERS IV SOLN: INTRAVENOUS | Status: DC

## 2019-01-10 MED ORDER — HEMOSTATIC AGENTS (NO CHARGE) OPTIME
TOPICAL | Status: DC | PRN
  Administered 2019-01-10: 1 via TOPICAL

## 2019-01-10 MED ORDER — OXYCODONE HCL 5 MG/5ML PO SOLN: 5.0000 mg | Freq: Once | ORAL | Status: DC | PRN

## 2019-01-10 MED ORDER — 0.9 % SODIUM CHLORIDE (POUR BTL) OPTIME
TOPICAL | Status: DC | PRN
  Administered 2019-01-10: 1000 mL

## 2019-01-10 MED ORDER — DEXAMETHASONE SODIUM PHOSPHATE 10 MG/ML IJ SOLN
INTRAMUSCULAR | Status: DC | PRN
  Administered 2019-01-10: 5 mg via INTRAVENOUS

## 2019-01-10 MED ORDER — PHENOL 1.4 % MT LIQD: 1.0000 | OROMUCOSAL | Status: DC | PRN

## 2019-01-10 MED ORDER — ONDANSETRON HCL 4 MG/2ML IJ SOLN: 4.0000 mg | Freq: Four times a day (QID) | INTRAMUSCULAR | Status: DC | PRN

## 2019-01-10 MED ORDER — MOMETASONE FURO-FORMOTEROL FUM 100-5 MCG/ACT IN AERO
2.0000 | INHALATION_SPRAY | Freq: Two times a day (BID) | RESPIRATORY_TRACT | Status: DC
  Administered 2019-01-10: 2 via RESPIRATORY_TRACT
  Filled 2019-01-10: qty 8.8

## 2019-01-10 MED ORDER — FLUOXETINE HCL 20 MG PO CAPS: 20.0000 mg | ORAL_CAPSULE | Freq: Every day | ORAL | Status: DC

## 2019-01-10 MED ORDER — GABAPENTIN 300 MG PO CAPS
300.0000 mg | ORAL_CAPSULE | Freq: Three times a day (TID) | ORAL | Status: DC
  Administered 2019-01-10: 300 mg via ORAL
  Filled 2019-01-10: qty 1

## 2019-01-10 MED ORDER — METHOCARBAMOL 500 MG PO TABS
ORAL_TABLET | ORAL | Status: AC
  Filled 2019-01-10: qty 1

## 2019-01-10 MED ORDER — LIDOCAINE 2% (20 MG/ML) 5 ML SYRINGE
INTRAMUSCULAR | Status: AC
  Filled 2019-01-10: qty 5

## 2019-01-10 MED ORDER — ROCURONIUM BROMIDE 10 MG/ML (PF) SYRINGE
PREFILLED_SYRINGE | INTRAVENOUS | Status: AC
  Filled 2019-01-10: qty 10

## 2019-01-10 SURGICAL SUPPLY — 57 items
CANISTER SUCT 3000ML PPV (MISCELLANEOUS) ×2 IMPLANT
CLSR STERI-STRIP ANTIMIC 1/2X4 (GAUZE/BANDAGES/DRESSINGS) ×2 IMPLANT
CONTROLLER PROCLAIM IPG E5 PAT (Neuro Prosthesis/Implant) ×2 IMPLANT
COVER MAYO STAND STRL (DRAPES) ×14 IMPLANT
COVER PROBE W GEL 5X96 (DRAPES) IMPLANT
COVER SURGICAL LIGHT HANDLE (MISCELLANEOUS) ×2 IMPLANT
COVER WAND RF STERILE (DRAPES) IMPLANT
DRAPE C-ARM 42X72 X-RAY (DRAPES) ×2 IMPLANT
DRAPE INCISE IOBAN 85X60 (DRAPES) ×2 IMPLANT
DRAPE SURG 17X23 STRL (DRAPES) ×2 IMPLANT
DRAPE U-SHAPE 47X51 STRL (DRAPES) ×2 IMPLANT
DRSG OPSITE POSTOP 4X6 (GAUZE/BANDAGES/DRESSINGS) ×2 IMPLANT
DRSG OPSITE POSTOP 4X8 (GAUZE/BANDAGES/DRESSINGS) ×2 IMPLANT
DURAPREP 26ML APPLICATOR (WOUND CARE) ×2 IMPLANT
ELECT BLADE 4.0 EZ CLEAN MEGAD (MISCELLANEOUS) ×2
ELECT CAUTERY BLADE 6.4 (BLADE) ×2 IMPLANT
ELECT PENCIL ROCKER SW 15FT (MISCELLANEOUS) ×2 IMPLANT
ELECT REM PT RETURN 9FT ADLT (ELECTROSURGICAL) ×2
ELECTRODE BLDE 4.0 EZ CLN MEGD (MISCELLANEOUS) ×1 IMPLANT
ELECTRODE REM PT RTRN 9FT ADLT (ELECTROSURGICAL) ×1 IMPLANT
GENERATOR PULSE PROCLAIM 5ELIT (Neuro Prosthesis/Implant) ×1 IMPLANT
GLOVE BIO SURGEON STRL SZ7 (GLOVE) ×2 IMPLANT
GLOVE BIOGEL PI IND STRL 7.0 (GLOVE) ×1 IMPLANT
GLOVE BIOGEL PI IND STRL 8.5 (GLOVE) ×1 IMPLANT
GLOVE BIOGEL PI INDICATOR 7.0 (GLOVE) ×1
GLOVE BIOGEL PI INDICATOR 8.5 (GLOVE) ×1
GOWN STRL REUS W/ TWL LRG LVL3 (GOWN DISPOSABLE) ×2 IMPLANT
GOWN STRL REUS W/TWL 2XL LVL3 (GOWN DISPOSABLE) ×2 IMPLANT
GOWN STRL REUS W/TWL LRG LVL3 (GOWN DISPOSABLE) ×2
KIT BASIN OR (CUSTOM PROCEDURE TRAY) ×2 IMPLANT
KIT LEAD 60CM OCTRODE (Lead) ×2 IMPLANT
KIT TURNOVER KIT B (KITS) ×2 IMPLANT
LEAD LAMITRODE 60CM 8CH (Lead) ×4 IMPLANT
NEEDLE 22X1 1/2 (OR ONLY) (NEEDLE) ×2 IMPLANT
NEEDLE SPNL 18GX3.5 QUINCKE PK (NEEDLE) ×2 IMPLANT
NS IRRIG 1000ML POUR BTL (IV SOLUTION) ×2 IMPLANT
PACK LAMINECTOMY ORTHO (CUSTOM PROCEDURE TRAY) ×2 IMPLANT
PACK UNIVERSAL I (CUSTOM PROCEDURE TRAY) ×2 IMPLANT
PAD ARMBOARD 7.5X6 YLW CONV (MISCELLANEOUS) ×4 IMPLANT
PULSE GENERATOR PROCLAIM 5ELIT (Neuro Prosthesis/Implant) ×2 IMPLANT
SPATULA SILICONE BRAIN 10MM (MISCELLANEOUS) ×2 IMPLANT
SPONGE LAP 4X18 RFD (DISPOSABLE) IMPLANT
SPONGE SURGIFOAM ABS GEL 100 (HEMOSTASIS) ×2 IMPLANT
STAPLER VISISTAT 35W (STAPLE) ×2 IMPLANT
SURGIFLO W/THROMBIN 8M KIT (HEMOSTASIS) ×2 IMPLANT
SUT BONE WAX W31G (SUTURE) ×2 IMPLANT
SUT ETHIBOND 2 OS 4 DA (SUTURE) ×2 IMPLANT
SUT MNCRL AB 3-0 PS2 18 (SUTURE) ×4 IMPLANT
SUT VIC AB 1 CT1 18XCR BRD 8 (SUTURE) ×2 IMPLANT
SUT VIC AB 1 CT1 8-18 (SUTURE) ×2
SUT VIC AB 2-0 CT1 18 (SUTURE) ×2 IMPLANT
SYR BULB IRRIGATION 50ML (SYRINGE) ×2 IMPLANT
SYR CONTROL 10ML LL (SYRINGE) ×2 IMPLANT
TOWEL GREEN STERILE (TOWEL DISPOSABLE) ×2 IMPLANT
TOWEL GREEN STERILE FF (TOWEL DISPOSABLE) ×2 IMPLANT
WATER STERILE IRR 1000ML POUR (IV SOLUTION) ×2 IMPLANT
YANKAUER SUCT BULB TIP NO VENT (SUCTIONS) ×2 IMPLANT

## 2019-01-10 NOTE — Brief Op Note (Signed)
01/10/2019  4:40 PM  PATIENT:  Shelly [name]  68 y.o. female  PRE-OPERATIVE DIAGNOSIS:  Chronic pain syndrome  POST-OPERATIVE DIAGNOSIS:  Chronic pain syndrome  PROCEDURE:  Procedure(s) with comments: LUMBAR SPINAL CORD STIMULATOR INSERTION (N/A) - 2.5 hrs  SURGEON:  Surgeon(s) and Role:    Melina Schools, MD - Primary  PHYSICIAN ASSISTANT:   ASSISTANTS: Amanda Ward   ANESTHESIA:   general  EBL:  150 mL   BLOOD ADMINISTERED:none  DRAINS: none   LOCAL MEDICATIONS USED:  MARCAINE     SPECIMEN:  No Specimen  DISPOSITION OF SPECIMEN:  N/A  COUNTS:  YES  TOURNIQUET:  * No tourniquets in log *  DICTATION: .Dragon Dictation  PLAN OF CARE: Admit for overnight observation  PATIENT DISPOSITION:  PACU - hemodynamically stable.

## 2019-01-10 NOTE — H&P (Signed)
History of Present Illness Milayah is a plesant 68 yo female with PMH of chronic back pain, severe DDD, and previous lumbar discectomy, who is here today for a pre-operative History and Physical. They are scheduled for SCS on 01-09-19 with Dr. Rolena Infante at [facility]. She has completed a SCS with Dr. Nelva Bush and had excellent results.  Allergies NKDA  Medications albuterol sulfate HFA 90 mcg/actuation aerosol inhaler aspirin 325 mg tablet,delayed release Bactrim DS 800 mg-160 mg tablet benzonatate 200 mg capsule bisacodyL 5 mg tablet,delayed release buPROPion HCL XL 300 mg 24 hr tablet, extended release ciprofloxacin 0.3 % eye drops FLUoxetine 20 mg capsule Hydromet 5 mg-1.5 mg/5 mL oral syrup lisinopriL 20 mg tablet losartan 50 mg tablet montelukast 10 mg tablet peg-electrolyte solution 420 gram oral solution prednisoLONE acetate 1 % eye drops,suspension Restasis 0.05 % eye drops in a dropperette rosuvastatin 10 mg tablet Shingrix (PF) 50 mcg/0.5 mL intramuscular suspension, kit Symbicort 80 mcg-4.5 mcg/actuation HFA aerosol inhaler TylenoL 325 mg tablet valACYclovir 1 gram tablet            Problems Lumbar spondylosis - Onset: 10/31/2017 Degeneration of lumbar intervertebral disc - Onset: 09/18/2017 Neck pain - Onset: 08/03/2017 Cervical radiculopathy - Onset: 08/10/2017 Low back pain - Onset: 07/19/2017  Family History Father - No current problems or disability Mother - No current problems or disability  Surgical History Lumbar microdiscectomy Appendectomy  Past Medical History Reviewed Past Medical History Joint Pain: Y Notes: chronic neck and back pain, h/o BKA  Physical Exam General: AAOX3, well developed and well nourished, NAD Ambulation Normal uses right below knee prosthesis assitive devices Heart: RRR, no rubs, murmers, or gallops. Lungs: CTAB Abdomen: Normal bowel soundsX4, soft, non-tender, no hepatosplenomegaly. No loss of bladder/bowel  control.  ROM: -Spine: normal ROM, pain in bilaterl thighs elicited with fwd flexion and extension. - Knee: flexion and extension normal and pain free bilaterally (Does have a below knee prosthesis right leg). - Ankle: Dorsiflexion, plantarflexion, inversion, eversion normal and pain free (left leg only, right leg with prosthesis).   Dermatomes: Lower extremity sensation to light touch intact bilaterally with positive dysathesias on the left.  Myotomes:  - Hip Flexion: Left 5/5, Right 5/5 - Knee Extension: Left 5/5, Right 5/5 - Knee Flexion: Left 5/5, Right 5/5 - Ankle Dorsiflexion: Left 5/5, Right N/a - Ankle Plantarflexion: Left 5/5, Right N/a  Reflexes:  - Patella: Left2+, Right 2+ - Achilles: Left2+, Right N/a - Babinski: Left Ngative, Right N/a - Clonus: Negative  PV: Left LE warm and well profuse with palpable pulses.   AP and lateral x-rays of the thoracic spine were obtained at today's visit. Normal spinal alignment, no signs of listhesis or compression fractures. There is degenerative disc disease throughout.   MRI of thoracic spine dated 11/13/2018 shows no significant stenosis, no contraindication and of spinal cord stimulator paddles.   Assessment & Plan Diagnosis: Shelly [name] presents today for preop evaluation for a spinal cord stimulator. Patient had a satisfactory trial with significant provement in her pain. She requires less medication was more active in her overall quality-of-life is significantly better. As a result she presents today for definitive implantation. I have gone over the surgical procedure in great detail. This required 2 incisions one in the mid thoracic region and the second over the left gluteal region to place the battery. I have gone over the surgery and addressed the risks and benefits and she has expressed understanding.   Plan:  Risks and benefits of surgery  were discussed with the patient. These include: Infection, bleeding, death, stroke, paralysis,  ongoing or worse pain, need for additional surgery, leak of spinal fluid, Failure of the battery requiring reoperation. Inability to place the paddle requiring the surgery to be aborted. Migration of the lead, failure to obtain results similar to the trial.  We discussed goals of surgery, the post-op period, and post-op red flags. Patient expressed an understanding of this. All of patients questions were invited and answered.  We have obtained pre-operative clearance from cardiologist and PCP. Patient has stopped her ASA and vitamins in prep for surgery.

## 2019-01-10 NOTE — Op Note (Signed)
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  Postoperative diagnosis: Same  Operative procedure permanent implantation of spinal cord stimulator status post successful trial.  First Assistant: Cleta Alberts, PA  Implant: St Jude ([employer]) S-8 lamitrode lead with proclaim 5 battery.    Complications: None.  No CSF leak noted at the conclusion of the case.  Hemostasis was achieved prior to closure.  Indications: Shelly [name] is a very pleasant woman who is had chronic back buttock and neuropathic leg pain.  She had a previous lumbar decompression but unfortunately still had significant pain.  She recently had a trial of spinal cord related and had successful improvement in pain and decreased pain and improved quality of life.  As result she presented to me for definitive implantation.  All appropriate risks benefits and alternatives were discussed with the patient and consent was obtained.  Operative report: Patient was brought the operating place upon the operating room table.  After successful induction of general anesthesia endotracheal patient teds SCDs were applied she was turned prone onto the Wilson frame.  All bony prominences well-padded and the back was prepped and draped in a standard fashion.  Timeout was taken to confirm patient procedure and all other important data.  Fluoroscopy view was used to identify the T12 vertebral body and then I marked out from the midportion of the T10 vertebral down to the inferior aspect of the T11 vertebral body.  I infiltrated the incision site with quarter percent Marcaine with epinephrine.  Made a midline incision and sharply dissected down to the fascia.  I incised the deep fascia and stripped the paraspinal muscles to expose the T10, T11, and a portion of the T12 spinous processes bilaterally.  I then continued my dissection out laterally until I could see the costovertebral  articulation.  This allowed me to see the posterior elements of the thoracic spine.  I then placed a marker in the T10-11 interval spinous process space and took a second x-ray.  I again confirmed the T10 vertebral body.  Using a double-action [employer] I remove the majority of the T10 spinous process.  I then used a fine nerve hook to develop a plane underneath the T10 lamina and use a 1 mm and 2 mm Kerrison punch to perform a laminotomy of T10.  Once this was done I then used a Penfield 4 to gently dissect through the central raphae of the ligamentum flavum and I used a 1 mm Kerrison Roger to resect the ligamentum flavum and exposed the dorsal epidural fat.  Once I had this exposed I then gently dissected through the epidural fat until I could see the dorsal surface of the thecal sac.  At this point I tried the tri-polepaddle but I was unable to pass this without a sensation of excessive force.  Given the fact that I could not pass the trial I then switched to the S8 paddle.  I obtained the paddle and I was able to easily pass this under the T10 laminotomy site and along the dorsal surface of the thecal sac to the inferior aspect of the T7 vertebral body.  The paddle itself was in the midline.  I attempted to pass a second S8 lead on the right side but unfortunately continued to deviate laterally.  I then tried percutaneously to place on the right side but again it would deviate laterally.  At this point with midline portion of the single  estimate lead and failed attempts of placing a second lead along the right-hand side I elected to just use the one lead.  The lead was then secured directly to the T11 spinous process with a #1 Ethibond suture.  I then passed the leads through the T11-12 interspinous process ligament and then wrapped the lead around the T11 spinous process for security.  I then made a second left-sided gluteal incision dissected down 3 cm and made a pocket for the battery.  I then passed the  leads submuscular from the thoracic wound to the gluteal wound.  I then secured the lead into the battery and irrigated the wound.  I then placed the battery into the pocket and closed the packet with interrupted #1 Vicryl sutures.  The lead was then tested by the spinal cord stimulator representative and she stated that they were all functioning appropriately.  In addition she felt as though the coverage provided by the paddle would be adequate to treat her pain.  At this point time both wounds were closely irrigated with normal saline.  I then inspected the thoracic wound to ensure there was no CSF leak and I had hemostasis using bipolar cautery and FloSeal.  After repeat irrigation and confirming hemostasis I closed both wounds in a layered fashion with interrupted #1 Vicryl sutures for the deep fascia, 2-0 Vicryl sutures for the superficial and 3-0 Monocryl for the skin.  Steri-Strips and dry dressings were applied and the patient was ultimately extubated and transferred the PACU without incident.  The end of the case all needle sponge counts were correct.

## 2019-01-10 NOTE — Transfer of Care (Signed)
Immediate Anesthesia Transfer of Care Note  Patient: Shelly [name]  Procedure(s) Performed: LUMBAR SPINAL CORD STIMULATOR INSERTION (N/A )  Patient Location: PACU  Anesthesia Type:General  Level of Consciousness: drowsy and patient cooperative  Airway & Oxygen Therapy: Patient Spontanous Breathing and Patient connected to face mask oxygen  Post-op Assessment: Report given to RN and Post -op Vital signs reviewed and stable  Post vital signs: Reviewed and stable  Last Vitals:  Vitals Value Taken Time  BP 110/60 01/10/19 1653  Temp 37.1 C 01/10/19 1653  Pulse 97 01/10/19 1654  Resp 27 01/10/19 1654  SpO2 99 % 01/10/19 1654  Vitals shown include unvalidated device data.  Last Pain:  Vitals:   01/10/19 1119  TempSrc: Oral  PainSc: 5       Patients Stated Pain Goal: 3 (76/14/70 [id number])  Complications: No apparent anesthesia complications

## 2019-01-10 NOTE — Anesthesia Procedure Notes (Signed)
Procedure Name: Intubation Date/Time: 01/10/2019 1:59 PM Performed by: Kathryne Hitch, CRNA Pre-anesthesia Checklist: Patient identified, Emergency Drugs available, Suction available and Patient being monitored Patient Re-evaluated:Patient Re-evaluated prior to induction Oxygen Delivery Method: Circle system utilized Preoxygenation: Pre-oxygenation with 100% oxygen Induction Type: IV induction Ventilation: Mask ventilation without difficulty Laryngoscope Size: Miller and 2 Grade View: Grade I Tube type: Oral Tube size: 7.0 mm Number of attempts: 1 Airway Equipment and Method: Stylet and Oral airway Placement Confirmation: ETT inserted through vocal cords under direct vision,  positive ETCO2 and breath sounds checked- equal and bilateral Secured at: 22 cm Tube secured with: Tape Dental Injury: Teeth and Oropharynx as per pre-operative assessment

## 2019-01-11 ENCOUNTER — Encounter (HOSPITAL_COMMUNITY): Payer: Self-pay | Admitting: Emergency Medicine

## 2019-01-11 ENCOUNTER — Other Ambulatory Visit: Payer: Self-pay

## 2019-01-11 ENCOUNTER — Emergency Department (HOSPITAL_COMMUNITY): Payer: Medicare Other

## 2019-01-11 ENCOUNTER — Emergency Department (HOSPITAL_COMMUNITY)
Admission: EM | Admit: 2019-01-11 | Discharge: 2019-01-11 | Disposition: A | Discharge status: Home / Self Care | Payer: Medicare Other | Source: Home / Self Care | Attending: Emergency Medicine | Admitting: Emergency Medicine

## 2019-01-11 DIAGNOSIS — M545 Low back pain: Secondary | ICD-10-CM | POA: Diagnosis not present

## 2019-01-11 DIAGNOSIS — Z8544 Personal history of malignant neoplasm of other female genital organs: Secondary | ICD-10-CM | POA: Insufficient documentation

## 2019-01-11 DIAGNOSIS — Z89511 Acquired absence of right leg below knee: Secondary | ICD-10-CM | POA: Insufficient documentation

## 2019-01-11 DIAGNOSIS — G8918 Other acute postprocedural pain: Secondary | ICD-10-CM | POA: Insufficient documentation

## 2019-01-11 DIAGNOSIS — J029 Acute pharyngitis, unspecified: Secondary | ICD-10-CM | POA: Insufficient documentation

## 2019-01-11 DIAGNOSIS — D7389 Other diseases of spleen: Secondary | ICD-10-CM | POA: Diagnosis not present

## 2019-01-11 DIAGNOSIS — J45909 Unspecified asthma, uncomplicated: Secondary | ICD-10-CM | POA: Insufficient documentation

## 2019-01-11 DIAGNOSIS — R1010 Upper abdominal pain, unspecified: Secondary | ICD-10-CM | POA: Insufficient documentation

## 2019-01-11 DIAGNOSIS — R05 Cough: Secondary | ICD-10-CM | POA: Diagnosis not present

## 2019-01-11 DIAGNOSIS — R079 Chest pain, unspecified: Secondary | ICD-10-CM | POA: Diagnosis not present

## 2019-01-11 DIAGNOSIS — M549 Dorsalgia, unspecified: Secondary | ICD-10-CM | POA: Diagnosis not present

## 2019-01-11 DIAGNOSIS — I10 Essential (primary) hypertension: Secondary | ICD-10-CM | POA: Insufficient documentation

## 2019-01-11 DIAGNOSIS — M961 Postlaminectomy syndrome, not elsewhere classified: Secondary | ICD-10-CM | POA: Diagnosis not present

## 2019-01-11 DIAGNOSIS — Z87891 Personal history of nicotine dependence: Secondary | ICD-10-CM | POA: Insufficient documentation

## 2019-01-11 DIAGNOSIS — Z79899 Other long term (current) drug therapy: Secondary | ICD-10-CM | POA: Insufficient documentation

## 2019-01-11 LAB — CBC WITH DIFFERENTIAL/PLATELET
Abs Immature Granulocytes: 0.02 10*3/uL (ref 0.00–0.07)
Basophils Absolute: 0 10*3/uL (ref 0.0–0.1)
Basophils Relative: 0 %
Eosinophils Absolute: 0 10*3/uL (ref 0.0–0.5)
Eosinophils Relative: 0 %
HCT: 34 % — ABNORMAL LOW (ref 36.0–46.0)
Hemoglobin: 10.4 g/dL — ABNORMAL LOW (ref 12.0–15.0)
Immature Granulocytes: 0 %
Lymphocytes Relative: 21 %
Lymphs Abs: 2.1 10*3/uL (ref 0.7–4.0)
MCH: 30 pg (ref 26.0–34.0)
MCHC: 30.6 g/dL (ref 30.0–36.0)
MCV: 98 fL (ref 80.0–100.0)
Monocytes Absolute: 1 10*3/uL (ref 0.1–1.0)
Monocytes Relative: 10 %
Neutro Abs: 7 10*3/uL (ref 1.7–7.7)
Neutrophils Relative %: 69 %
Platelets: 309 10*3/uL (ref 150–400)
RBC: 3.47 MIL/uL — ABNORMAL LOW (ref 3.87–5.11)
RDW: 12.8 % (ref 11.5–15.5)
WBC: 10.3 10*3/uL (ref 4.0–10.5)
nRBC: 0 % (ref 0.0–0.2)

## 2019-01-11 LAB — COMPREHENSIVE METABOLIC PANEL
ALT: 21 U/L (ref 0–44)
AST: 31 U/L (ref 15–41)
Albumin: 3.2 g/dL — ABNORMAL LOW (ref 3.5–5.0)
Alkaline Phosphatase: 56 U/L (ref 38–126)
Anion gap: 10 (ref 5–15)
BUN: 15 mg/dL (ref 8–23)
CO2: 28 mmol/L (ref 22–32)
Calcium: 8.7 mg/dL — ABNORMAL LOW (ref 8.9–10.3)
Chloride: 98 mmol/L (ref 98–111)
Creatinine, Ser: 0.95 mg/dL (ref 0.44–1.00)
GFR calc Af Amer: >60 mL/min (ref >60)
GFR calc non Af Amer: >60 mL/min (ref >60)
Glucose, Bld: 99 mg/dL (ref 70–99)
Potassium: 3.8 mmol/L (ref 3.5–5.1)
Sodium: 136 mmol/L (ref 135–145)
Total Bilirubin: 0.3 mg/dL (ref 0.3–1.2)
Total Protein: 6.6 g/dL (ref 6.5–8.1)

## 2019-01-11 LAB — LIPASE, BLOOD: Lipase: 19 U/L (ref 11–51)

## 2019-01-11 LAB — D-DIMER, QUANTITATIVE: D-Dimer, Quant: 0.99 ug/mL-FEU — ABNORMAL HIGH (ref 0.00–0.50)

## 2019-01-11 LAB — TROPONIN I (HIGH SENSITIVITY): Troponin I (High Sensitivity): 3 ng/L (ref <18)

## 2019-01-11 MED ORDER — SODIUM CHLORIDE 0.9% FLUSH: 3.0000 mL | Freq: Once | INTRAVENOUS | Status: DC

## 2019-01-11 MED ORDER — DIAZEPAM 5 MG/ML IJ SOLN
5.0000 mg | Freq: Once | INTRAMUSCULAR | Status: AC
  Administered 2019-01-11: 5 mg via INTRAVENOUS
  Filled 2019-01-11: qty 2

## 2019-01-11 MED ORDER — WHITE PETROLATUM EX OINT
TOPICAL_OINTMENT | CUTANEOUS | Status: AC
  Administered 2019-01-11: 10:00:00
  Filled 2019-01-11: qty 28.35

## 2019-01-11 MED ORDER — SODIUM CHLORIDE 0.9 % IV BOLUS
1000.0000 mL | Freq: Once | INTRAVENOUS | Status: AC
  Administered 2019-01-11: 1000 mL via INTRAVENOUS

## 2019-01-11 MED ORDER — IOHEXOL 350 MG/ML SOLN
100.0000 mL | Freq: Once | INTRAVENOUS | Status: AC | PRN
  Administered 2019-01-11: 100 mL via INTRAVENOUS

## 2019-01-11 MED ORDER — MORPHINE SULFATE (PF) 4 MG/ML IV SOLN
4.0000 mg | Freq: Once | INTRAVENOUS | Status: AC
  Administered 2019-01-11: 4 mg via INTRAVENOUS
  Filled 2019-01-11: qty 1

## 2019-01-11 NOTE — Discharge Instructions (Addendum)
Today you were seen for increased pain following your surgery. We did a CT scan of your chest to see if you were having a pulmonary embolism and a CT scan of your abdomen to see if there were any other issues. There were no issues detected on your scan that would explain the pain that you are having. We also did blood tests to look for pancreatitis or signs of infection. Most likely the pain you are experiencing is from your surgery and will get better with time. We would advise that you increase the frequency that you take your oxycodone from every 6 hours to every 4 hours as needed for pain. You should also call the doctors who performed your surgery to schedule a followup appointment. If you need a refill on your pain medication, you should call these doctors to see if they can prescribe additional medication for you.  Please call your doctor or return to the ER if you develop fever, chills, shortness of breath, severe pain not relieved by medication or if your surgical excision starts draining purulent fluid, develops significant redness or if you have any other concerns.

## 2019-01-11 NOTE — Discharge Summary (Signed)
Patient ID: Shelly [name] MRN: [id number] DOB/AGE: June 04, 1951 68 y.o.  Admit date: 01/10/2019 Discharge date: 01/11/2019  Admission Diagnoses:  Active Problems:   Chronic pain   Discharge Diagnoses:  Active Problems:   Chronic pain  status post Procedure(s): LUMBAR SPINAL CORD STIMULATOR INSERTION  Past Medical History:  Diagnosis Date  . Anxiety   . Arthritis   . Asthma   . Avascular necrosis of talus ([place])   . Cancer ([place])    vulva pre cancer had surgery  . Depression   . Gait abnormality 03/20/2017  . GERD (gastroesophageal reflux disease)   . Hearing loss    "very minor"  . History of pneumonia   . Hyperlipidemia   . Hypertension   . Leg edema    left  . Memory difficulty 03/20/2017  . PONV (postoperative nausea and vomiting)   . Stress incontinence     Surgeries: Procedure(s): LUMBAR SPINAL CORD STIMULATOR INSERTION on 01/10/2019   Consultants:   Discharged Condition: Improved  Hospital Course: VIRIGINIA [name] is an 68 y.o. female who was admitted 01/10/2019 for operative treatment of Failed back syndrome with neuropathic leg pain right worse than the left.. Patient failed conservative treatments (please see the history and physical for the specifics) and had severe unremitting pain that affects sleep, daily activities and work/hobbies. After pre-op clearance, the patient was taken to the operating room on 01/10/2019 and underwent  Procedure(s): [place].    Patient was given perioperative antibiotics:  Anti-infectives (From admission, onward)   Start     Dose/Rate Route Frequency Ordered Stop   01/10/19 2130  ceFAZolin (ANCEF) IVPB 1 g/50 mL premix     1 g 100 mL/hr over 30 Minutes Intravenous Every 8 hours 01/10/19 1840 01/11/19 0503   01/10/19 1108  ceFAZolin (ANCEF) 2-4 GM/100ML-% IVPB    Note to Pharmacy: Shelly [name]   : cabinet override      01/10/19 1108 01/10/19 2314   01/10/19 1104  ceFAZolin (ANCEF) IVPB 2g/100 mL  premix     2 g 200 mL/hr over 30 Minutes Intravenous 30 min pre-op 01/10/19 1104 01/10/19 1423       Patient was given sequential compression devices and early ambulation to prevent DVT.   Patient benefited maximally from hospital stay and there were no complications. At the time of discharge, the patient was urinating/moving their bowels without difficulty, tolerating a regular diet, pain is controlled with oral pain medications and they have been cleared by PT/OT.   Recent vital signs:  Patient Vitals for the past 24 hrs:  BP Temp Temp src Pulse Resp SpO2 Height Weight  01/11/19 0723 109/64 98.8 F (37.1 C) Oral (!) 101 (!) 28 95 % - -  01/11/19 0342 99/64 98.2 F (36.8 C) Oral 100 18 100 % - -  01/10/19 2310 (!) 119/55 98.3 F (36.8 C) Oral 98 18 95 % - -  01/10/19 1845 129/76 98.4 F (36.9 C) Oral 90 19 - - -  01/10/19 1815 - 97.8 F (36.6 C) - 87 17 95 % - -  01/10/19 1725 120/60 - - 89 14 97 % - -  01/10/19 1709 100/88 - - 90 19 93 % - -  01/10/19 1653 110/60 98.8 F (37.1 C) - 99 13 99 % - -  01/10/19 1119 112/70 98.4 F (36.9 C) Oral 93 16 94 % 5\' 7"  (1.702 m) 83.6 kg     Recent laboratory studies: No results for  input(s): WBC, HGB, HCT, PLT, NA, K, CL, CO2, BUN, CREATININE, GLUCOSE, INR, CALCIUM in the last 72 hours.  Invalid input(s): PT, 2   Discharge Medications:   Allergies as of 01/11/2019   No Known Allergies     Medication List    STOP taking these medications   aspirin 81 MG EC tablet     TAKE these medications   acetaminophen 650 MG CR tablet Commonly known as: TYLENOL Take 1,300 mg by mouth every 8 (eight) hours as needed for pain.   acetaminophen 325 MG tablet Commonly known as: TYLENOL Take 650 mg by mouth every 6 (six) hours as needed for moderate pain.   acidophilus Caps capsule Take 1 capsule by mouth daily.   B-12 PO Take 5,000 mcg by mouth daily.   BESIVANCE OP Place 1 drop into the left eye 3 (three) times daily.   Biotin 5000  MCG Tabs Take 5,000 mcg by mouth every evening.   budesonide-formoterol 80-4.5 MCG/ACT inhaler Commonly known as: Symbicort Inhale 2 puffs into the lungs 2 (two) times daily.   buPROPion 300 MG 24 hr tablet Commonly known as: WELLBUTRIN XL Take 300 mg by mouth daily.   CALCIUM CITRATE PO Take 400 mg by mouth 2 (two) times a day.   CENTRUM SILVER PO Take 1 tablet by mouth daily.   chlorpheniramine 4 MG tablet Commonly known as: CHLOR-TRIMETON Take 8 mg by mouth 2 (two) times a day.   denosumab 60 MG/ML Soln injection Commonly known as: PROLIA Inject 60 mg into the skin every 6 (six) months.   DUREZOL OP Place 1 drop into the left eye 3 (three) times daily.   FLUoxetine 20 MG capsule Commonly known as: PROZAC Take 20 mg by mouth daily.   losartan 50 MG tablet Commonly known as: COZAAR Take 50 mg by mouth daily.   methocarbamol 500 MG tablet Commonly known as: Robaxin Take 1 tablet (500 mg total) by mouth every 8 (eight) hours as needed for up to 5 days for muscle spasms.   montelukast 10 MG tablet Commonly known as: SINGULAIR Take 1 tablet (10 mg total) by mouth at bedtime.   NexIUM 20 MG capsule Generic drug: esomeprazole Take 20 mg by mouth daily.   ondansetron 4 MG tablet Commonly known as: Zofran Take 1 tablet (4 mg total) by mouth every 8 (eight) hours as needed for nausea or vomiting.   oxyCODONE-acetaminophen 10-325 MG tablet Commonly known as: Percocet Take 1 tablet by mouth every 6 (six) hours as needed for up to 5 days for pain.   PROLENSA OP Place 1 drop into the left eye daily.   rosuvastatin 10 MG tablet Commonly known as: CRESTOR Take 10 mg by mouth every evening.   valACYclovir 1000 MG tablet Commonly known as: VALTREX Take 1,000 mg by mouth daily as needed.   VITAMIN D PO Take 5,000 Units by mouth daily.       Diagnostic Studies: Dg Chest 2 View  Result Date: 01/03/2019 CLINICAL DATA:  Preoperative spinal cord stimulator.   Recent fall EXAM: CHEST - 2 VIEW COMPARISON:  December 12, 2018 FINDINGS: There is a calcified granuloma in the left lower lobe. There is no edema or consolidation. Heart size and pulmonary vascularity are normal. No adenopathy. No bone lesions. IMPRESSION: Calcified granuloma left lower lobe. No edema or consolidation. Stable cardiac silhouette. Electronically Signed   By: Lowella Grip III M.D.   On: 01/03/2019 10:50   Dg Chest 2 View  Result  Date: 12/12/2018 CLINICAL DATA:  Preop evaluation.  Asthmatic cough.  Hypertension. EXAM: CHEST - 2 VIEW COMPARISON:  05/15/2014 FINDINGS: Cardiac silhouette is normal in size. No mediastinal or hilar masses. There is no evidence of adenopathy. Stable granuloma, left lower lobe.  Lungs otherwise clear. Mild elevation of left hemidiaphragm, stable. No pleural effusion or pneumothorax. Skeletal structures are intact. IMPRESSION: No active cardiopulmonary disease. Electronically Signed   By: Lajean Manes M.D.   On: 12/12/2018 19:40   Dg Lumbar Spine 2-3 Views  Result Date: 01/10/2019 CLINICAL DATA:  Spinal nerve stimulator placement EXAM: LUMBAR SPINE - 2-3 VIEW COMPARISON:  None. FINDINGS: Tip of the spinal nerve stimulator appears to be positioned around the T7-T8 intervertebral disc level. Transesophageal tube is seen on the frontal projection with the tip at the level of the GE junction. Osseous structures and soft tissues are otherwise unremarkable. IMPRESSION: Tip of the spinal nerve stimulator tip appears positioned at the T7-T8 intervertebral disc level. Electronically Signed   By: Lovena Le M.D.   On: 01/10/2019 16:56    Discharge Instructions    Incentive spirometry RT   Complete by: As directed       Follow-up Information    Melina Schools, MD. Schedule an appointment as soon as possible for a visit in 2 weeks.   Specialty: Orthopedic Surgery Why: If symptoms worsen, For suture removal, For wound re-check Contact information: [address] [place] [address] [phone]           Discharge Plan:  discharge to home  Disposition: stable    Signed: Yvonne Kendall Ward for [facility] PA-C [facility] [phone] 01/11/2019, 9:19 AM

## 2019-01-11 NOTE — Anesthesia Postprocedure Evaluation (Signed)
Anesthesia Post Note  Patient: Shelly [name]  Procedure(s) Performed: LUMBAR SPINAL CORD STIMULATOR INSERTION (N/A )     Patient location during evaluation: PACU Anesthesia Type: General Level of consciousness: awake and alert and oriented Pain management: pain level controlled Vital Signs Assessment: post-procedure vital signs reviewed and stable Respiratory status: spontaneous breathing, nonlabored ventilation and respiratory function stable Cardiovascular status: blood pressure returned to baseline Postop Assessment: no apparent nausea or vomiting Anesthetic complications: no               Brennan Bailey

## 2019-01-11 NOTE — Plan of Care (Signed)
Patient alert and oriented, mae's well, voiding adequate amount of urine, swallowing without difficulty, no c/o pain at time of discharge. Patient discharged home with family. Script and discharged instructions given to patient. Patient and family stated understanding of instructions given. Patient has an appointment with Dr. Brooks  

## 2019-01-11 NOTE — Progress Notes (Addendum)
Subjective: 1 Day Post-Op Procedure(s) (LRB): LUMBAR SPINAL CORD STIMULATOR INSERTION (N/A) Patient reports pain as 6 on 0-10 scale.  Complaining of incisional site pain only Tolerating PO without nausea or vomiting +ambulation +void +Flatus, - BM Denies HA, dizziness, SOB, CP, calf pain, sweats/chills.  Objective: Vital signs in last 24 hours: Temp:  [97.8 F (36.6 C)-98.8 F (37.1 C)] 98.8 F (37.1 C) (08/07 0723) Pulse Rate:  [87-101] 101 (08/07 0723) Resp:  [13-28] 28 (08/07 0723) BP: (99-129)/(55-88) 109/64 (08/07 0723) SpO2:  [93 %-100 %] 95 % (08/07 0723) Weight:  [83.6 kg] 83.6 kg (08/06 1119)  Intake/Output from previous day: 08/06 0701 - 08/07 0700 In: 1953.6 [P.O.:360; I.V.:1500; IV Piggyback:93.6] Out: 150 [Blood:150] Intake/Output this shift: No intake/output data recorded.  No results for input(s): HGB in the last 72 hours. No results for input(s): WBC, RBC, HCT, PLT in the last 72 hours. No results for input(s): NA, K, CL, CO2, BUN, CREATININE, GLUCOSE, CALCIUM in the last 72 hours. No results for input(s): LABPT, INR in the last 72 hours.  Neurologically intact ABD soft Neurovascular intact Sensation intact distally Intact pulses distally Dorsiflexion/Plantar flexion intact Incision: dressing C/D/I No cellulitis present Compartment soft   Assessment/Plan: 1 Day Post-Op Procedure(s) (LRB): LUMBAR SPINAL CORD STIMULATOR INSERTION (N/A) Advance diet Up with therapy  DVT PPx: Teds, SCDs, ambulation. Hold ASA for next 7 days Encourage IS Pain well controlled with current regimen.  Plan D/c home today   F/u 2 weeks in the office   Shelly [name] 01/11/2019, 9:21 AM

## 2019-01-11 NOTE — ED Triage Notes (Addendum)
Pt here for eval of sore throat and cough post op spinal cord stimulator that was placed yesterday. Pt also has pain across the abdomen and lower back.

## 2019-01-11 NOTE — ED Provider Notes (Signed)
[facility] EMERGENCY DEPARTMENT Provider Note   CSN: [id number] Arrival date & time: 01/11/19  1417     History   Chief Complaint Chief Complaint  Patient presents with  . Post-op Follow-up    HPI Shelly [name] is a 68 y.o. female postop day #1, discharged this AM, after placement of lumbar spinal cord stimulator for neuropathic leg pain who presents today for cough along with abdominal and back pain.  Patient states she had a sore throat last night with development of a productive cough this morning.  Patient also reports 10 out of 10 achy upper abdominal and back pain that started last night. Pain is better when lying down, worse with deep breathing, and only partially relieved with oxycodone and tylenol. Patient reports taking oxycodone this morning as well as 1300 mg of Tylenol. Patient also reports 1 episode of nonbilious nonbloody emesis earlier today.  Patient says she is otherwise tolerated oral intake eating breakfast, and lunch.  She has not had a bowel movement but has passed flatus.  Patient denies fever, chills, diarrhea, dysuria, urgency.      HPI  Past Medical History:  Diagnosis Date  . Anxiety   . Arthritis   . Asthma   . Avascular necrosis of talus ([place])   . Cancer ([place])    vulva pre cancer had surgery  . Depression   . Gait abnormality 03/20/2017  . GERD (gastroesophageal reflux disease)   . Hearing loss    "very minor"  . History of pneumonia   . Hyperlipidemia   . Hypertension   . Leg edema    left  . Memory difficulty 03/20/2017  . PONV (postoperative nausea and vomiting)   . Stress incontinence     Patient Active Problem List   Diagnosis Date Noted  . Chronic pain 01/10/2019  . Memory difficulty 03/20/2017  . Gait abnormality 03/20/2017  . History of right below knee amputation ([place]) 07/11/2016  . Adenomatous colon polyp 05/10/2016  . Coronary artery calcification 01/07/2016  . Essential hypertension 01/07/2016  . Back  pain 09/09/2015  . Abnormal CXR 05/16/2014  . Cough variant asthma 05/15/2014  . Acute post-operative pain 02/18/2012  . Adiposity 02/17/2012  . Does use eyeglasses 02/13/2012  . H/O arthrodesis 10/21/2011  . Fibromyalgia 04/11/2011  . HLD (hyperlipidemia) 04/11/2011  . Leg edema 09/08/2010    Past Surgical History:  Procedure Laterality Date  . ANKLE FUSION  2011   right multiple   . ANKLE FUSION     rear ankle fusion  . ANKLE SURGERY  2010   right cordicompression  . AORTIC ARCH ANGIOGRAPHY N/A 12/11/2017   Procedure: AORTIC ARCH ANGIOGRAPHY;  Surgeon: Elam Dutch, MD;  Location: [facility];  Service: Cardiovascular;  Laterality: N/A;  . APPENDECTOMY  05/10/2016  . BELOW KNEE LEG AMPUTATION    . CATARACT EXTRACTION W/ INTRAOCULAR LENS IMPLANT Left   . LAPAROSCOPIC APPENDECTOMY N/A 05/10/2016   Procedure: APPENDECTOMY LAPAROSCOPIC;  Surgeon: Coralie Keens, MD;  [facility];  Service: General;  Laterality: N/A;  . LUMBAR LAMINECTOMY/DECOMPRESSION MICRODISCECTOMY N/A 09/09/2015   Procedure:  L4-S1 Decompression/ Discetomy;  Surgeon: Melina Schools, MD;  Location: [facility];  Service: Orthopedics;  Laterality: N/A;  . SKIN GRAFT    . TONSILLECTOMY    . TUBAL LIGATION  1983  . VULVECTOMY N/A 01/28/2015   Procedure: WIDE EXCISION VULVECTOMY;  Surgeon: Thurnell Lose, MD;  Location: [facility];  Service: Gynecology;  Laterality: N/A;  OB History    Gravida  3   Para  3   Term  3   Preterm      AB      Living  3     SAB      TAB      Ectopic      Multiple      Live Births               Home Medications    Prior to Admission medications   Medication Sig Start Date End Date Taking? Authorizing Provider  acetaminophen (TYLENOL) 325 MG tablet Take 650 mg by mouth every 6 (six) hours as needed for moderate pain.    [provider]  acetaminophen (TYLENOL) 650 MG CR tablet Take 1,300 mg by mouth every 8 (eight) hours as needed for pain.      [provider]  acidophilus (RISAQUAD) CAPS capsule Take 1 capsule by mouth daily.    [provider]  Besifloxacin HCl (BESIVANCE OP) Place 1 drop into the left eye 3 (three) times daily.    [provider]  Biotin 5000 MCG TABS Take 5,000 mcg by mouth every evening.    [provider]  Bromfenac Sodium (PROLENSA OP) Place 1 drop into the left eye daily.    [provider]  budesonide-formoterol (SYMBICORT) 80-4.5 MCG/ACT inhaler Inhale 2 puffs into the lungs 2 (two) times daily. 10/31/18   Tanda Rockers, MD  buPROPion (WELLBUTRIN XL) 300 MG 24 hr tablet Take 300 mg by mouth daily.      [provider]  CALCIUM CITRATE PO Take 400 mg by mouth 2 (two) times a day.    [provider]  chlorpheniramine (CHLOR-TRIMETON) 4 MG tablet Take 8 mg by mouth 2 (two) times a day.     [provider]  Cholecalciferol (VITAMIN D PO) Take 5,000 Units by mouth daily.    [provider]  Cyanocobalamin (B-12 PO) Take 5,000 mcg by mouth daily.     [provider]  denosumab (PROLIA) 60 MG/ML SOLN injection Inject 60 mg into the skin every 6 (six) months.     [provider]  Difluprednate (DUREZOL OP) Place 1 drop into the left eye 3 (three) times daily.    [provider]  esomeprazole (NEXIUM) 20 MG capsule Take 20 mg by mouth daily.     [provider]  FLUoxetine (PROZAC) 20 MG capsule Take 20 mg by mouth daily.    [provider]  losartan (COZAAR) 50 MG tablet Take 50 mg by mouth daily.  09/12/18   [provider]  methocarbamol (ROBAXIN) 500 MG tablet Take 1 tablet (500 mg total) by mouth every 8 (eight) hours as needed for up to 5 days for muscle spasms. 01/10/19 01/15/19  Melina Schools, MD  montelukast (SINGULAIR) 10 MG tablet Take 1 tablet (10 mg total) by mouth at bedtime. 06/30/14   Tanda Rockers, MD  Multiple Vitamins-Minerals (CENTRUM SILVER PO) Take 1 tablet by mouth  daily.      [provider]  ondansetron (ZOFRAN) 4 MG tablet Take 1 tablet (4 mg total) by mouth every 8 (eight) hours as needed for nausea or vomiting. 01/10/19   Melina Schools, MD  oxyCODONE-acetaminophen (PERCOCET) 10-325 MG tablet Take 1 tablet by mouth every 6 (six) hours as needed for up to 5 days for pain. 01/10/19 01/15/19  Melina Schools, MD  rosuvastatin (CRESTOR) 10 MG tablet Take 10 mg  by mouth every evening.  10/18/17   [provider]  valACYclovir (VALTREX) 1000 MG tablet Take 1,000 mg by mouth daily as needed.  06/09/09   [provider]    Family History Family History  Problem Relation Age of Onset  . Dementia Mother 59       alive  . Fibromyalgia Mother   . Allergies Mother   . Heart attack Father 50       deceased  . Multiple myeloma Father   . Other Brother        alive  . Heart disease Brother        emergent CABG for 99% blocked CAD  . Heart murmur Sister 34       she had open heart surgery  . Other Sister 40       She is bIpolar- diabetic  . Breast cancer Sister     Social History Social History   Tobacco Use  . Smoking status: Former Smoker    Packs/day: 1.00    Years: 40.00    Pack years: 40.00    Types: Cigarettes    Quit date: 02/05/2007    Years since quitting: 11.9  . Smokeless tobacco: Never Used  Substance Use Topics  . Alcohol use: Yes    Alcohol/week: 0.0 standard drinks    Comment: socially  . Drug use: No     Allergies   Patient has no known allergies.   Review of Systems Review of Systems  Constitutional: Negative for fever.  Respiratory: Positive for cough.   Cardiovascular: Negative for chest pain.  Gastrointestinal: Positive for abdominal pain, nausea and vomiting. Negative for blood in stool.  Genitourinary: Negative for dysuria and urgency.  Musculoskeletal: Positive for back pain.  All other systems reviewed and are negative.    Physical Exam Updated Vital Signs BP (!) 126/58   Pulse 94    Temp 98.9 F (37.2 C) (Oral)   Resp 16   LMP  (LMP Unknown)   SpO2 97%   Physical Exam Constitutional:      Appearance: Normal appearance.  HENT:     Head: Normocephalic and atraumatic.     Right Ear: External ear normal.     Left Ear: External ear normal.  Eyes:     Extraocular Movements: Extraocular movements intact.  Cardiovascular:     Rate and Rhythm: Normal rate and regular rhythm.     Heart sounds: No murmur. No friction rub. No gallop.   Pulmonary:     Effort: Pulmonary effort is normal.     Breath sounds: Normal breath sounds. No wheezing, rhonchi or rales.  Abdominal:     General: Abdomen is flat. Bowel sounds are normal.     Palpations: Abdomen is soft.     Tenderness: There is abdominal tenderness.     Comments: Tenderness more pronounced in upper abdomen  Skin:    General: Skin is warm and dry.     Comments: Incisions on back examined and steri strips are c/d/i without significant drainage  Neurological:     Mental Status: She is alert.     Comments: Alert and oriented  Psychiatric:        Mood and Affect: Mood normal.        Behavior: Behavior normal.        Thought Content: Thought content normal.      ED Treatments / Results  Labs (all labs ordered are listed, but only abnormal results are displayed) Labs  Reviewed  CBC WITH DIFFERENTIAL/PLATELET - Abnormal; Notable for the following components:      Result Value   RBC 3.47 (*)    Hemoglobin 10.4 (*)    HCT 34.0 (*)    All other components within normal limits  COMPREHENSIVE METABOLIC PANEL  D-DIMER, QUANTITATIVE (NOT AT [facility])  TROPONIN I (HIGH SENSITIVITY)    EKG None  Radiology Dg Lumbar Spine 2-3 Views  Result Date: 01/10/2019 CLINICAL DATA:  Spinal nerve stimulator placement EXAM: LUMBAR SPINE - 2-3 VIEW COMPARISON:  None. FINDINGS: Tip of the spinal nerve stimulator appears to be positioned around the T7-T8 intervertebral disc level. Transesophageal tube is seen on the frontal projection  with the tip at the level of the GE junction. Osseous structures and soft tissues are otherwise unremarkable. IMPRESSION: Tip of the spinal nerve stimulator tip appears positioned at the T7-T8 intervertebral disc level. Electronically Signed   By: Lovena Le M.D.   On: 01/10/2019 16:56    Procedures Procedures (including critical care time)  Medications Ordered in ED Medications  sodium chloride flush (NS) 0.9 % injection 3 mL (has no administration in time range)  sodium chloride 0.9 % bolus 1,000 mL (has no administration in time range)     Initial Impression / Assessment and Plan / ED Course  I have reviewed the triage vital signs and the nursing notes.  Pertinent labs & imaging results that were available during my care of the patient were reviewed by me and considered in my medical decision making (see chart for details).         Shelly [name] is a 69 year old female recently discharged following spinal cord stimulator placement who presents with abdominal and back pain as well as cough.  D-dimer was elevated but CTA was negative for PE.  CT abdomen pelvis was negative for acute abnormalities.  Hemoglobin 10.4 down from presurgical level 12.0, likely from operative blood loss.  LFTs within normal limits and white count 10.3, troponin, lipase WNL..  Patient reassured of normal findings, and advised to increase frequency of PRN oxycodone to every 4 hours and follow-up with surgical clinic.  Final Clinical Impressions(s) / ED Diagnoses   Final diagnoses:  None    ED Discharge Orders    None       Jeanmarie Hubert, MD 01/11/19 2100    Drenda Freeze, MD 01/12/19 [phone]

## 2019-01-11 NOTE — Evaluation (Addendum)
Occupational Therapy Evaluation Patient Details Name: Shelly [name] MRN: [id number] DOB: 10-08-1950 Today's Date: 01/11/2019    History of Present Illness Pt is a 68 yo female s/p spinal cord stimulator placement. PMHx: R BKA, lumbar sx.   Clinical Impression   Pt PTA: lives alone with proper DME and AE. Pt reports independence in all aspects of care prior. Pt currently limited by pain, but performing LB dressing with figure 4 technique. No additional LB AE required. Pt modified independent with ADL mobility and transfers. Back handout provided and reviewed ADL in detail. Pt educated on: clothing between brace, never sleep in brace, set an alarm at night for medication, avoid sitting for long periods of time, correct bed positioning for sleeping, correct sequence for bed mobility, avoiding lifting more than 5 pounds and never wash directly over incision. All education is complete and patient indicates understanding. Pt does not require continued OT. OT following acutely.        Follow Up Recommendations  No OT follow up    Equipment Recommendations  None recommended by OT    Recommendations for Other Services       Precautions / Restrictions Precautions Precautions: Back Precaution Booklet Issued: Yes (comment) Precaution Comments: verbally discussed back precautions Required Braces or Orthoses: Spinal Brace Spinal Brace: Lumbar corset Restrictions Weight Bearing Restrictions: No      Mobility Bed Mobility Overal bed mobility: Needs Assistance Bed Mobility: Sidelying to Sit;Sit to Sidelying   Sidelying to sit: Supervision     Sit to sidelying: Supervision General bed mobility comments: verbal cues for log roll technique  Transfers Overall transfer level: Needs assistance Equipment used: None Transfers: Sit to/from Stand Sit to Stand: Modified independent (Device/Increase time)              Balance Overall balance assessment: No apparent balance deficits (not  formally assessed)                                         ADL either performed or assessed with clinical judgement   ADL Overall ADL's : At baseline                                       General ADL Comments: Pt has DME and reachers at home; pt performing LB ADL with figure 4 technique..     Vision Baseline Vision/History: Wears glasses Wears Glasses: At all times Patient Visual Report: No change from baseline Vision Assessment?: No apparent visual deficits     Perception     Praxis      Pertinent Vitals/Pain Pain Assessment: Faces Faces Pain Scale: Hurts little more Pain Location: low back Pain Descriptors / Indicators: Discomfort Pain Intervention(s): Monitored during session;Patient requesting pain meds-RN notified     Hand Dominance Right   Extremity/Trunk Assessment Upper Extremity Assessment Upper Extremity Assessment: Overall WFL for tasks assessed   Lower Extremity Assessment Lower Extremity Assessment: Overall WFL for tasks assessed   Cervical / Trunk Assessment Cervical / Trunk Assessment: Other exceptions Cervical / Trunk Exceptions: s/p back sx   Communication Communication Communication: No difficulties   Cognition Arousal/Alertness: Awake/alert Behavior During Therapy: WFL for tasks assessed/performed Overall Cognitive Status: Within Functional Limits for tasks assessed  General Comments       Exercises     Shoulder Instructions      Home Living Family/patient expects to be discharged to:: Private residence Living Arrangements: Alone Available Help at Discharge: Family;Available PRN/intermittently Type of Home: House Home Access: Stairs to enter Entrance Stairs-Number of Steps: 2 Entrance Stairs-Rails: None Home Layout: Two level;Bed/bath upstairs Alternate Level Stairs-Number of Steps: uses chairlift; full flight   Bathroom Shower/Tub: Arts administrator: Handicapped height     Home Equipment: Wheelchair - manual;Hand held shower head;Grab bars - tub/shower;Grab bars - toilet;Toilet riser;Tub bench;Walker - 2 wheels;Cane - single point;Other (comment)(lift chair)          Prior Functioning/Environment Level of Independence: Independent with assistive device(s)        Comments: SPC for occasional assist; pt drives and performs all tasks; uses shopping cart to lean on for balance        OT Problem List: Decreased activity tolerance;Pain      OT Treatment/Interventions:      OT Goals(Current goals can be found in the care plan section)    OT Frequency:     Barriers to D/C:            Co-evaluation              AM-PAC OT "6 Clicks" Daily Activity     Outcome Measure Help from another person eating meals?: None Help from another person taking care of personal grooming?: None Help from another person toileting, which includes using toliet, bedpan, or urinal?: None Help from another person bathing (including washing, rinsing, drying)?: A Little Help from another person to put on and taking off regular upper body clothing?: None Help from another person to put on and taking off regular lower body clothing?: A Little 6 Click Score: 22   End of Session Nurse Communication: Mobility status;Patient requests pain meds  Activity Tolerance: Patient tolerated treatment well Patient left: in chair;with call bell/phone within reach  OT Visit Diagnosis: Pain;Other abnormalities of gait and mobility (R26.89) Pain - part of body: (back)                Time: 7353-2992 OT Time Calculation (min): 37 min Charges:  OT General Charges $OT Visit: 1 Visit OT Evaluation $OT Eval Moderate Complexity: 1 Mod OT Treatments $Self Care/Home Management : 8-22 mins  Ebony Hail Harold Hedge) Marsa Aris OTR/L Acute Rehabilitation Services Pager: [phone] Office: [place] 01/11/2019, 9:21 AM

## 2019-01-11 NOTE — ED Notes (Signed)
Patient transported to CT 

## 2019-01-11 NOTE — Evaluation (Signed)
Physical Therapy Evaluation and Discharge Patient Details Name: Shelly [name] MRN: [id number] DOB: 09/27/1950 Today's Date: 01/11/2019   History of Present Illness  Pt is a 68 yo female s/p spinal cord stimulator placement. PMHx: R BKA, lumbar sx.  Clinical Impression  Patient evaluated by Physical Therapy with no further acute PT needs identified. All education has been completed and the patient has no further questions. At the time of PT eval pt was able to perform transfers and ambulation with gross modified independence and no AD. Pt was educated on precautions, car transfer and appropriate activity progression. Pt emotional throughout session and tearful at times however states she feels it is an effect from the medication. See below for any follow-up Physical Therapy or equipment needs. PT is signing off. Thank you for this referral.     Follow Up Recommendations No PT follow up;Supervision - Intermittent    Equipment Recommendations  None recommended by PT    Recommendations for Other Services       Precautions / Restrictions Precautions Precautions: Back Precaution Booklet Issued: Yes (comment) Precaution Comments: verbally discussed back precautions Required Braces or Orthoses: Spinal Brace Spinal Brace: Lumbar corset Restrictions Weight Bearing Restrictions: No      Mobility  Bed Mobility Overal bed mobility: Needs Assistance Bed Mobility: Sidelying to Sit;Sit to Sidelying   Sidelying to sit: Supervision     Sit to sidelying: Supervision General bed mobility comments: Pt was received standing at nurses station  Transfers Overall transfer level: Modified independent Equipment used: None Transfers: Sit to/from Stand Sit to Stand: Modified independent (Device/Increase time)         General transfer comment: No assist required. Pt managing sit<>stand well without assist or AD  Ambulation/Gait Ambulation/Gait assistance: Modified independent (Device/Increase  time) Gait Distance (Feet): 400 Feet Assistive device: None Gait Pattern/deviations: Antalgic Gait velocity: Decreased Gait velocity interpretation: 1.31 - 2.62 ft/sec, indicative of limited community ambulator General Gait Details: Antalgic 2 BKA and prosthetic however overall ambulating well and likely at baseline.   Stairs            Wheelchair Mobility    Modified Rankin (Stroke Patients Only)       Balance Overall balance assessment: No apparent balance deficits (not formally assessed)                                           Pertinent Vitals/Pain Pain Assessment: Faces Faces Pain Scale: Hurts a little bit Pain Location: low back Pain Descriptors / Indicators: Operative site guarding Pain Intervention(s): Monitored during session;Repositioned    Home Living Family/patient expects to be discharged to:: Private residence Living Arrangements: Alone Available Help at Discharge: Family;Available PRN/intermittently Type of Home: House Home Access: Stairs to enter Entrance Stairs-Rails: None Entrance Stairs-Number of Steps: 2 Home Layout: Two level;Bed/bath upstairs Home Equipment: Wheelchair - manual;Hand held shower head;Grab bars - tub/shower;Grab bars - toilet;Toilet riser;Tub bench;Walker - 2 wheels;Cane - single point;Other (comment)(lift chair)      Prior Function Level of Independence: Independent with assistive device(s)         Comments: SPC for occasional assist; pt drives and performs all tasks; uses shopping cart to lean on for balance     Hand Dominance   Dominant Hand: Right    Extremity/Trunk Assessment   Upper Extremity Assessment Upper Extremity Assessment: Defer to OT evaluation    Lower Extremity  Assessment Lower Extremity Assessment: Overall WFL for tasks assessed    Cervical / Trunk Assessment Cervical / Trunk Assessment: Other exceptions Cervical / Trunk Exceptions: s/p back sx  Communication    Communication: No difficulties  Cognition Arousal/Alertness: Awake/alert Behavior During Therapy: WFL for tasks assessed/performed Overall Cognitive Status: Within Functional Limits for tasks assessed                                        General Comments      Exercises     Assessment/Plan    PT Assessment Patent does not need any further PT services  PT Problem List         PT Treatment Interventions      PT Goals (Current goals can be found in the Care Plan section)  Acute Rehab PT Goals Patient Stated Goal: Home today PT Goal Formulation: All assessment and education complete, DC therapy    Frequency     Barriers to discharge        Co-evaluation               AM-PAC PT "6 Clicks" Mobility  Outcome Measure Help needed turning from your back to your side while in a flat bed without using bedrails?: None Help needed moving from lying on your back to sitting on the side of a flat bed without using bedrails?: None Help needed moving to and from a bed to a chair (including a wheelchair)?: None Help needed standing up from a chair using your arms (e.g., wheelchair or bedside chair)?: None Help needed to walk in hospital room?: None Help needed climbing 3-5 steps with a railing? : A Little 6 Click Score: 23    End of Session   Activity Tolerance: Patient tolerated treatment well Patient left: in chair;with call bell/phone within reach Nurse Communication: Mobility status PT Visit Diagnosis: Pain Pain - part of body: (back)    Time: 3568-6168 PT Time Calculation (min) (ACUTE ONLY): 16 min   Charges:   PT Evaluation $PT Eval Low Complexity: 1 Low          Shelly [name], PT, DPT Acute Rehabilitation Services Pager: [phone] Office: [phone]   Shelly [name] 01/11/2019, 11:20 AM

## 2019-01-14 ENCOUNTER — Encounter (HOSPITAL_COMMUNITY): Payer: Self-pay | Admitting: Orthopedic Surgery

## 2019-01-31 DIAGNOSIS — G459 Transient cerebral ischemic attack, unspecified: Secondary | ICD-10-CM | POA: Diagnosis not present

## 2019-01-31 DIAGNOSIS — F325 Major depressive disorder, single episode, in full remission: Secondary | ICD-10-CM | POA: Diagnosis not present

## 2019-01-31 DIAGNOSIS — I10 Essential (primary) hypertension: Secondary | ICD-10-CM | POA: Diagnosis not present

## 2019-01-31 DIAGNOSIS — F329 Major depressive disorder, single episode, unspecified: Secondary | ICD-10-CM | POA: Diagnosis not present

## 2019-01-31 DIAGNOSIS — M810 Age-related osteoporosis without current pathological fracture: Secondary | ICD-10-CM | POA: Diagnosis not present

## 2019-01-31 DIAGNOSIS — E785 Hyperlipidemia, unspecified: Secondary | ICD-10-CM | POA: Diagnosis not present

## 2019-02-07 DIAGNOSIS — H25811 Combined forms of age-related cataract, right eye: Secondary | ICD-10-CM | POA: Diagnosis not present

## 2019-02-12 ENCOUNTER — Other Ambulatory Visit: Payer: Self-pay | Admitting: Internal Medicine

## 2019-02-21 DIAGNOSIS — L738 Other specified follicular disorders: Secondary | ICD-10-CM | POA: Diagnosis not present

## 2019-02-21 DIAGNOSIS — D225 Melanocytic nevi of trunk: Secondary | ICD-10-CM | POA: Diagnosis not present

## 2019-02-21 DIAGNOSIS — D692 Other nonthrombocytopenic purpura: Secondary | ICD-10-CM | POA: Diagnosis not present

## 2019-02-21 DIAGNOSIS — L814 Other melanin hyperpigmentation: Secondary | ICD-10-CM | POA: Diagnosis not present

## 2019-02-21 DIAGNOSIS — L812 Freckles: Secondary | ICD-10-CM | POA: Diagnosis not present

## 2019-02-21 DIAGNOSIS — L905 Scar conditions and fibrosis of skin: Secondary | ICD-10-CM | POA: Diagnosis not present

## 2019-02-21 DIAGNOSIS — L821 Other seborrheic keratosis: Secondary | ICD-10-CM | POA: Diagnosis not present

## 2019-02-21 DIAGNOSIS — L570 Actinic keratosis: Secondary | ICD-10-CM | POA: Diagnosis not present

## 2019-02-21 DIAGNOSIS — L819 Disorder of pigmentation, unspecified: Secondary | ICD-10-CM | POA: Diagnosis not present

## 2019-02-21 DIAGNOSIS — L245 Irritant contact dermatitis due to other chemical products: Secondary | ICD-10-CM | POA: Diagnosis not present

## 2019-02-21 DIAGNOSIS — D1801 Hemangioma of skin and subcutaneous tissue: Secondary | ICD-10-CM | POA: Diagnosis not present

## 2019-02-21 DIAGNOSIS — I788 Other diseases of capillaries: Secondary | ICD-10-CM | POA: Diagnosis not present

## 2019-02-28 DIAGNOSIS — Z1159 Encounter for screening for other viral diseases: Secondary | ICD-10-CM | POA: Diagnosis not present

## 2019-02-28 DIAGNOSIS — F329 Major depressive disorder, single episode, unspecified: Secondary | ICD-10-CM | POA: Diagnosis not present

## 2019-02-28 DIAGNOSIS — Z79899 Other long term (current) drug therapy: Secondary | ICD-10-CM | POA: Diagnosis not present

## 2019-02-28 DIAGNOSIS — G459 Transient cerebral ischemic attack, unspecified: Secondary | ICD-10-CM | POA: Diagnosis not present

## 2019-02-28 DIAGNOSIS — Z7982 Long term (current) use of aspirin: Secondary | ICD-10-CM | POA: Diagnosis not present

## 2019-02-28 DIAGNOSIS — H524 Presbyopia: Secondary | ICD-10-CM | POA: Diagnosis not present

## 2019-02-28 DIAGNOSIS — H25811 Combined forms of age-related cataract, right eye: Secondary | ICD-10-CM | POA: Diagnosis not present

## 2019-02-28 DIAGNOSIS — I10 Essential (primary) hypertension: Secondary | ICD-10-CM | POA: Diagnosis not present

## 2019-02-28 DIAGNOSIS — M810 Age-related osteoporosis without current pathological fracture: Secondary | ICD-10-CM | POA: Diagnosis not present

## 2019-02-28 DIAGNOSIS — E785 Hyperlipidemia, unspecified: Secondary | ICD-10-CM | POA: Diagnosis not present

## 2019-02-28 DIAGNOSIS — J45909 Unspecified asthma, uncomplicated: Secondary | ICD-10-CM | POA: Diagnosis not present

## 2019-02-28 DIAGNOSIS — F325 Major depressive disorder, single episode, in full remission: Secondary | ICD-10-CM | POA: Diagnosis not present

## 2019-03-01 DIAGNOSIS — Z79899 Other long term (current) drug therapy: Secondary | ICD-10-CM | POA: Diagnosis not present

## 2019-03-01 DIAGNOSIS — H259 Unspecified age-related cataract: Secondary | ICD-10-CM | POA: Diagnosis not present

## 2019-03-01 DIAGNOSIS — H25811 Combined forms of age-related cataract, right eye: Secondary | ICD-10-CM | POA: Diagnosis not present

## 2019-03-01 DIAGNOSIS — I10 Essential (primary) hypertension: Secondary | ICD-10-CM | POA: Diagnosis not present

## 2019-03-01 DIAGNOSIS — H524 Presbyopia: Secondary | ICD-10-CM | POA: Diagnosis not present

## 2019-03-01 DIAGNOSIS — Z7982 Long term (current) use of aspirin: Secondary | ICD-10-CM | POA: Diagnosis not present

## 2019-03-01 DIAGNOSIS — J45909 Unspecified asthma, uncomplicated: Secondary | ICD-10-CM | POA: Diagnosis not present

## 2019-03-11 DIAGNOSIS — M62838 Other muscle spasm: Secondary | ICD-10-CM | POA: Diagnosis not present

## 2019-03-11 DIAGNOSIS — R252 Cramp and spasm: Secondary | ICD-10-CM | POA: Diagnosis not present

## 2019-03-11 DIAGNOSIS — Z23 Encounter for immunization: Secondary | ICD-10-CM | POA: Diagnosis not present

## 2019-03-15 ENCOUNTER — Ambulatory Visit: Payer: Medicare Other | Admitting: Internal Medicine

## 2019-03-19 ENCOUNTER — Ambulatory Visit: Payer: Medicare Other | Admitting: Internal Medicine

## 2019-03-25 ENCOUNTER — Encounter: Payer: Self-pay | Admitting: Internal Medicine

## 2019-03-25 ENCOUNTER — Other Ambulatory Visit: Payer: Self-pay

## 2019-03-25 ENCOUNTER — Ambulatory Visit (INDEPENDENT_AMBULATORY_CARE_PROVIDER_SITE_OTHER): Payer: Medicare Other | Admitting: Internal Medicine

## 2019-03-25 DIAGNOSIS — R058 Other specified cough: Secondary | ICD-10-CM

## 2019-03-25 DIAGNOSIS — R05 Cough: Secondary | ICD-10-CM | POA: Diagnosis not present

## 2019-03-25 DIAGNOSIS — I2584 Coronary atherosclerosis due to calcified coronary lesion: Secondary | ICD-10-CM | POA: Diagnosis not present

## 2019-03-25 DIAGNOSIS — I2510 Atherosclerotic heart disease of native coronary artery without angina pectoris: Secondary | ICD-10-CM | POA: Diagnosis not present

## 2019-03-25 DIAGNOSIS — R9389 Abnormal findings on diagnostic imaging of other specified body structures: Secondary | ICD-10-CM | POA: Diagnosis not present

## 2019-03-25 DIAGNOSIS — J45991 Cough variant asthma: Secondary | ICD-10-CM | POA: Diagnosis not present

## 2019-03-25 MED ORDER — PREDNISONE 10 MG PO TABS: ORAL_TABLET | ORAL | 0 refills | Status: DC

## 2019-03-25 MED ORDER — BENZONATATE 200 MG PO CAPS: 200.0000 mg | ORAL_CAPSULE | Freq: Three times a day (TID) | ORAL | 1 refills | Status: DC | PRN

## 2019-03-25 NOTE — Patient Instructions (Addendum)
Prednisone 10 mg take  4 each am x 2 days,   2 each am x 2 days,  1 each am x 2 days and stop   Tessalon 200 mg up to four times daily as need for tickle/ throat cough   For drainage / throat tickle try take CHLORPHENIRAMINE  4 mg  (Chlortab 4mg   at [employer] should be easiest to find in the green box)  take one-two  every 4 hours as needed - available over the counter- may cause drowsiness so start with just a bedtime dose or two and see how you tolerate it before trying in daytime    GERD (REFLUX)  is an extremely common cause of respiratory symptoms just like yours , many times with no obvious heartburn at all.    It can be treated with medication, but also with lifestyle changes including elevation of the head of your bed (ideally with 6 -8inch blocks under the headboard of your bed),  Smoking cessation, avoidance of late meals, excessive alcohol, and avoid fatty foods, chocolate, peppermint, colas, red wine, and acidic juices such as orange juice.  NO MINT OR MENTHOL PRODUCTS SO NO COUGH DROPS  USE SUGARLESS CANDY INSTEAD (Jolley ranchers or Stover's or Life Savers) or even ice chips will also do - the key is to swallow to prevent all throat clearing. NO OIL BASED VITAMINS - use powdered substitutes.  Avoid fish oil when coughing.  Call for follow up if not all better in 2 weeks

## 2019-03-25 NOTE — Progress Notes (Signed)
Subjective:    Patient ID: Shelly [name], female    DOB: 1950/07/02       MRN: [id number]    Brief patient profile:  15 yowf daughter of optimetrist  quit smoking 2008  watery eyes in her 25s year round ? Worse in winter eventually started on zyrtec 2013 and seemed to help s assoc cough/ wheeze then new onset cough Aug 2015 and proved refractory so referred to pulmonary clinic 05/15/14  by Dr Jeremy Johann    History of Present Illness  05/15/2014 1st [facility] office visit/ [facility]   Chief Complaint  Patient presents with  . Pulmonary Consult    Referred by Dr. Carol Ada. Pt c/o cough since August 2015- cough is non prod and somtimes seems worse at night. She also c/o DOE with walking up stairs.    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   Prednisone 10 mg take  4 each am x 2 days,   2 each am x 2 days,  1 each am x 2 days and stop (this is to eliminate allergies and inflammation from coughing) Omeprazole 40 mg  Take 30-60 min before first meal of the day and Pepcid 20 mg one bedtime plus chlorpheniramine 4 mg x 2 at bedtime  GERD diet reviewed     05/28/2014 f/u ov/[facility]ert re: cough since aug 2015 Chief Complaint  Patient presents with  . Acute Visit    Pt states that after last visit her cough had pretty much resolved, but then started back about 1 wk ago.  Cough is non prod and "comes and goes".   95% better, still urge to  clear throat daytime on above rx/ confused about when to d/c what  No noct cough  at all  Sob doe x steps better Then gradually worse x one week , again dry and variable   Kouffman Reflux v Neurogenic Cough Differentiator Reflux Comments  Do you awaken from a sound sleep coughing violently?                            With trouble breathing? Not now   Do you have choking episodes when you cannot  Get enough air, gasping for air ?              Not now   Do you usually cough when you lie down into  The bed, or when you just lie down to rest ?                          Not now    Do you usually cough after meals or eating?         No   Do you cough when (or after) you bend over?    Still some  GERD SCORE     Kouffman Reflux v Neurogenic Cough Differentiator Neurogenic   Do you more-or-less cough all day long? no   Does change of temperature make you cough? no   Does laughing or chuckling cause you to cough? Still some   Do fumes (perfume, automobile fumes, burned  Toast, etc.,) cause you to cough ?      no   Does speaking, singing, or talking on the phone cause you to cough   ?               Not now   Neurogenic/Airway score       rec Prednisone 10 mg take  4 each am x 2 days,   2 each am x 2 days,  1 each am x 2 days and stop  Take Tessalon 4x daily  and supplement if needed with  tramadol 50 mg up to 2 every 4 hours to suppress the urge to cough. Swallowing water or using ice chips/non mint and menthol containing candies (such as lifesavers or sugarless jolly ranchers) are also effective.  You should rest your voice and avoid activities that you know make you cough. Once you have eliminated the cough for 3 straight days try reducing the tramadol first,  then the tessalon as tolerated Please see patient coordinator before you leave today  to schedule sinus and chest CT and fluoro > neg       10/18/2018   Pulmonary/ re-estabish/[facility] re:  Tendency to cough sporadic pattern x years,  recurrent since March 2020 while on singulair  Chief Complaint  Patient presents with  . Pulmonary Consult    Self referral.  Pt states has had increased cough x 2 months.  Cough is currently non prod. She has not had to use her proair in the past wk.     was maint on singulair,  h1 x 2 hs and no gerd rx then acutely worse x 2 months s obvious trigger Typically year round pattern is that every few weeks the cough starts back up  Dyspnea:  MMRC1 = even on best edays  can walk nl pace, flat grade, can't hurry or go uphills or steps s sob   Cough: mostly dry / gag ? Sometime vomit when cough is severe and "almost pass out"  Sleeping: bed is flat / cough was bad at night better now  SABA use: saba helped as does prednisone  02: 02  Using lots of mints  acei intol ? When  d/c'd (started summer 2019)  rec Plan A = Automatic =Dulera 100 Take 2 puffs first thing in am and then another 2 puffs about 12 hours later and continue singulair 10 mg each pm  Work on inhaler technique:   Plan B = Backup Only use your albuterol inhaler as a rescue medication  Whenever cough start  prilosec 20 mg Take 30- 60 min before your first and last meals of the day until no longer coughing and  For drainage / throat tickle try take CHLORPHENIRAMINE  4 mg  (Chlortab 4mg   at [employer] should be easiest to find in the green box)  take one every 4 hours as needed - available over the counter- may cause drowsiness so start with just a bedtime dose or two and see how you tolerate it before trying in daytime      12/12/2018  f/u ov/[facility] re:  Cough resolved on dulera 100 > symb 80 /4.5 plus singulair plus prn  h1   Chief Complaint  Patient presents with  . Follow-up    Breathing is doing well and no new co's today. She has not been using her albuterol inhaler.   Dyspnea:  MMRC1 = can walk nl pace, flat grade, can't hurry or go uphills or steps s sob   Cough: none Sleeping: no resp symptoms  SABA use: none 02: none   rec Please remember to go to the  x-ray department  for your tests - we will call you with the results when they are available . No change in medication - ok to stop singulair after surgery > never stopped  Remember for any flare of cough > add back nexium 20mg  Take 30- 60 min before your first and last meals of the day x until better for at least a week    03/25/2019  f/u ov/[facility] re: cough variant asthma vs uacs on symb 80 2bid /singulair chlorphiramine flared p ET for back surgery  > assoc with ST  Chief Complaint  Patient presents with  . Follow-up    Increased cough x 10 days- non prod. She also has noticed minimal wheezing x 2 days.    Dyspnea:  Not limited by breathing from desired activities   Cough: dry, worse around 5 pm but does not wake her up noct, worse with laughter Sleeping: on side /bed flat better p cough drop hs and no noct cough  SABA use: none  02: none    No obvious day to day or daytime variability or assoc excess/ purulent sputum or mucus plugs or hemoptysis or cp or chest tightness,  or overt sinus or hb symptoms.   Sleeping as above without nocturnal  or early am exacerbation  of respiratory  c/o's or need for noct saba. Also denies any obvious fluctuation of symptoms with weather or environmental changes or other aggravating or alleviating factors except as outlined above   No unusual exposure hx or h/o childhood pna/ asthma or knowledge of premature birth.  Current Allergies, Complete Past Medical History, Past Surgical History, Family History, and Social History were reviewed in [employer] [facility] record.  ROS  The following are not active complaints unless bolded Hoarseness, sore throat, dysphagia, dental problems, itching, sneezing,  nasal congestion or discharge of excess mucus or purulent secretions, ear ache,   fever, chills, sweats, unintended wt loss or wt gain, classically pleuritic or exertional cp,  orthopnea pnd or arm/hand swelling   or leg swelling, presyncope, palpitations, abdominal pain, anorexia, nausea, vomiting, diarrhea  or change in bowel habits or change in bladder habits, change in stools or change in urine, dysuria, hematuria,  rash, arthralgias, visual complaints, headache, numbness, weakness or ataxia or problems with walking or coordination,  change in mood or  memory.        Current Meds  Medication Sig  . acidophilus (RISAQUAD) CAPS capsule Take 1 capsule by mouth daily.  . Biotin 5000 MCG TABS Take 5,000 mcg by mouth every evening.  Marland Kitchen buPROPion (WELLBUTRIN XL) 300 MG 24 hr tablet Take 300 mg by mouth daily.    Marland Kitchen CALCIUM CITRATE PO Take 400 mg by mouth 2 (two) times a day.  . chlorpheniramine (CHLOR-TRIMETON) 4 MG tablet Take 8 mg by mouth 2 (two) times a day.   . Cholecalciferol (VITAMIN D PO) Take 5,000 Units by mouth daily.  . Cyanocobalamin (B-12 PO) Take 5,000 mcg by mouth daily.   Marland Kitchen denosumab (PROLIA) 60 MG/ML SOLN injection Inject  60 mg into the skin every 6 (six) months.   . esomeprazole (NEXIUM) 20 MG capsule Take 20 mg by mouth daily.   Marland Kitchen FLUoxetine (PROZAC) 20 MG capsule Take 20 mg by mouth daily.  Marland Kitchen losartan (COZAAR) 50 MG tablet Take 50 mg by mouth daily.   . montelukast (SINGULAIR) 10 MG tablet Take 1 tablet (10 mg total) by mouth at bedtime.  . Multiple Vitamins-Minerals (CENTRUM SILVER PO) Take 1 tablet by mouth daily.    . ondansetron (ZOFRAN) 4 MG tablet Take 1 tablet (4 mg total) by mouth every 8 (eight) hours as needed for nausea or vomiting.  . rosuvastatin (CRESTOR) 10 MG tablet Take 10 mg by mouth every evening.   . SYMBICORT 80-4.5 MCG/ACT inhaler INHALE 2 PUFFS INTO THE LUNGS 2 (TWO) TIMES DAILY.  . valACYclovir (VALTREX) 1000 MG tablet Take 1,000 mg by mouth daily as needed.           I personally reviewed images and agree with radiology impression as follows:   Chest CTa 01/11/2019 (done one day post op for ST and cough) No pe, no lung dz x for stable calcified nodule x 1 cm  x 2015 no change                     Objective:  Physical Exam  slt hoarse amb wf nad   slt hoarse amb wf, occ throat clearing  03/25/2019     188  12/12/2018         188  05/28/2014      208 >  06/11/14  206 >   06/30/2014 207  > 10/18/2018  187           05/15/14 207 lb (93.895 kg)  04/29/13 193 lb (87.544 kg)  09/08/10 195 lb (88.451 kg)       Vital signs reviewed - Note on arrival 02 sats  95% on RA            HEENT : pt wearing mask not removed for exam due to covid -19 concerns.    NECK :  without JVD/Nodes/TM/ nl carotid upstrokes bilaterally   LUNGS: no acc muscle use,  Nl contour chest which is clear to A and P bilaterally without cough on insp or exp maneuvers   CV:  RRR  no s3 or murmur or increase in P2, and no edema   ABD:  soft and nontender with nl inspiratory excursion in the supine position. No bruits or organomegaly appreciated, bowel sounds nl  MS:   ext warm with R BKA prosthesis s L  calf tenderness, cyanosis or clubbing No obvious joint restrictions   SKIN: warm and dry without lesions    NEURO:  alert, approp, nl sensorium with  no motor or cerebellar deficits apparent.           Assessment & Plan:

## 2019-03-26 ENCOUNTER — Encounter: Payer: Self-pay | Admitting: Internal Medicine

## 2019-03-26 DIAGNOSIS — R058 Other specified cough: Secondary | ICD-10-CM | POA: Insufficient documentation

## 2019-03-26 DIAGNOSIS — R05 Cough: Secondary | ICD-10-CM | POA: Insufficient documentation

## 2019-03-26 NOTE — Assessment & Plan Note (Signed)
Onset Aug 2015 spradic, severe, not seasonal - allergy profile 05/28/2014 > No eos, IgE 424 POS RAST only dust and mold  -  Sinus/chest  CT 06/03/2014 > wnl  - Trial of singulair 06/30/2014 >>>  ? Benefit  - 2019 proved intolerant of ACEi  - 10/18/2018  After extensive coaching inhaler device,  effectiveness =  75%> try dulera 100 2bid and 1st gen H1 blockers per guidelines    no mints > 100% resolution s gerd rx needed     In terms of asthma, All goals of chronic asthma control met including optimal function and elimination of symptoms with minimal need for rescue therapy.  Contingencies discussed in full including contacting this office immediately if not controlling the symptoms using the rule of two's.

## 2019-03-26 NOTE — Assessment & Plan Note (Addendum)
Onset 01/10/2019 sp ET   Of the three most common causes of  Sub-acute / recurrent or chronic cough, only one (GERD)  can actually contribute to/ trigger  the other two (asthma and post nasal drip syndrome)  and perpetuate the cylce of cough.  While not intuitively obvious, many patients with chronic low grade reflux do not cough until there is a primary insult that disturbs the protective epithelial barrier and exposes sensitive nerve endings.   This is typically viral but can due to PNDS or ET tube trauma and  either of the latter two may apply here.   The point is that once this occurs, it is difficult to eliminate the cycle  using anything but a maximally effective acid suppression regimen at least in the short run, accompanied by an appropriate diet to address non acid GERD and control / eliminate the cough itself for at least 3 days with tessalon and avoid min/menthol products,  Use 1st gen H1 blockers per guidelines  And Also added 6 days of Prednisone in case of component of Th-2 driven upper or lower airways inflammation (if cough responds short term only to relapse befor return while will on rx for uacs that would point to allergic rhinitis/ asthma or eos bronchitis)     I had an extended discussion with the patient reviewing all relevant studies completed to date and  lasting 15 to 20 minutes of a 25 minute visit    Each maintenance medication was reviewed in detail including most importantly the difference between maintenance and prns and under what circumstances the prns are to be triggered using an action plan format that is not reflected in the computer generated alphabetically organized AVS.     Please see AVS for specific instructions unique to this visit that I personally wrote and verbalized to the the pt in detail and then reviewed with pt  by my nurse highlighting any  changes in therapy recommended at today's visit to their plan of care.

## 2019-04-05 DIAGNOSIS — R52 Pain, unspecified: Secondary | ICD-10-CM | POA: Diagnosis not present

## 2019-04-05 DIAGNOSIS — M5136 Other intervertebral disc degeneration, lumbar region: Secondary | ICD-10-CM | POA: Diagnosis not present

## 2019-04-11 DIAGNOSIS — R252 Cramp and spasm: Secondary | ICD-10-CM | POA: Diagnosis not present

## 2019-04-24 DIAGNOSIS — M79641 Pain in right hand: Secondary | ICD-10-CM | POA: Diagnosis not present

## 2019-04-24 DIAGNOSIS — M180 Bilateral primary osteoarthritis of first carpometacarpal joints: Secondary | ICD-10-CM | POA: Diagnosis not present

## 2019-04-24 DIAGNOSIS — M79642 Pain in left hand: Secondary | ICD-10-CM | POA: Diagnosis not present

## 2019-04-26 DIAGNOSIS — B372 Candidiasis of skin and nail: Secondary | ICD-10-CM | POA: Diagnosis not present

## 2019-05-01 DIAGNOSIS — F325 Major depressive disorder, single episode, in full remission: Secondary | ICD-10-CM | POA: Diagnosis not present

## 2019-05-01 DIAGNOSIS — E785 Hyperlipidemia, unspecified: Secondary | ICD-10-CM | POA: Diagnosis not present

## 2019-05-01 DIAGNOSIS — G459 Transient cerebral ischemic attack, unspecified: Secondary | ICD-10-CM | POA: Diagnosis not present

## 2019-05-01 DIAGNOSIS — F329 Major depressive disorder, single episode, unspecified: Secondary | ICD-10-CM | POA: Diagnosis not present

## 2019-05-01 DIAGNOSIS — M810 Age-related osteoporosis without current pathological fracture: Secondary | ICD-10-CM | POA: Diagnosis not present

## 2019-05-01 DIAGNOSIS — I10 Essential (primary) hypertension: Secondary | ICD-10-CM | POA: Diagnosis not present

## 2019-05-10 ENCOUNTER — Ambulatory Visit (INDEPENDENT_AMBULATORY_CARE_PROVIDER_SITE_OTHER): Payer: Medicare Other | Admitting: Internal Medicine

## 2019-05-10 ENCOUNTER — Encounter: Payer: Self-pay | Admitting: Internal Medicine

## 2019-05-10 DIAGNOSIS — J45991 Cough variant asthma: Secondary | ICD-10-CM

## 2019-05-10 MED ORDER — PANTOPRAZOLE SODIUM 40 MG PO TBEC: 40.0000 mg | DELAYED_RELEASE_TABLET | Freq: Every day | ORAL | 2 refills | Status: DC

## 2019-05-10 MED ORDER — BENZONATATE 200 MG PO CAPS: 200.0000 mg | ORAL_CAPSULE | Freq: Three times a day (TID) | ORAL | 1 refills | Status: DC | PRN

## 2019-05-10 NOTE — Progress Notes (Signed)
Subjective:    Patient ID: Shelly [name], female    DOB: 1950/10/18       MRN: [id number]    Brief patient profile:  55 yowf daughter of optimetrist  quit smoking 2008  watery eyes in her 62s year round ? Worse in winter eventually started on zyrtec 2013 and seemed to help s assoc cough/ wheeze then new onset cough Aug 2015 and proved refractory so referred to pulmonary clinic 05/15/14  by Dr Jeremy Johann    History of Present Illness  05/15/2014 1st Shelly [name] Pulmonary office visit/ Shelly [name]   Chief Complaint  Patient presents with  . Pulmonary Consult    Referred by Dr. Carol Ada. Pt c/o cough since August 2015- cough is non prod and somtimes seems worse at night. She also c/o DOE with walking up stairs.    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   Prednisone 10 mg take  4 each am x 2 days,   2 each am x 2 days,  1 each am x 2 days and stop (this is to eliminate allergies and inflammation from coughing) Omeprazole 40 mg  Take 30-60 min before first meal of the day and Pepcid 20 mg one bedtime plus chlorpheniramine 4 mg x 2 at bedtime  GERD diet reviewed     05/28/2014 f/u ov/Shelly [name] re: cough since aug 2015 Chief Complaint  Patient presents with  . Acute Visit    Pt states that after last visit her cough had pretty much resolved, but then started back about 1 wk ago.  Cough is non prod and "comes and goes".   95% better, still urge to  clear throat daytime on above rx/ confused about when to d/c what  No noct cough  at all  Sob doe x steps better Then gradually worse x one week , again dry and variable   Kouffman Reflux v Neurogenic Cough Differentiator Reflux Comments  Do you awaken from a sound sleep coughing violently?                            With trouble breathing? Not now   Do you have choking episodes when you cannot  Get enough air, gasping for air ?              Not now   Do you usually cough when you lie down into  The bed, or when you just lie down to rest ?                          Not now    Do you usually cough after meals or eating?         No   Do you cough when (or after) you bend over?    Still some  GERD SCORE     Kouffman Reflux v Neurogenic Cough Differentiator Neurogenic   Do you more-or-less cough all day long? no   Does change of temperature make you cough? no   Does laughing or chuckling cause you to cough? Still some   Do fumes (perfume, automobile fumes, burned  Toast, etc.,) cause you to cough ?      no   Does speaking, singing, or talking on the phone cause you to cough   ?               Not now   Neurogenic/Airway score       rec Prednisone 10 mg take  4 each am x 2 days,   2 each am x 2 days,  1 each am x 2 days and stop  Take Tessalon 4x daily  and supplement if needed with  tramadol 50 mg up to 2 every 4 hours to suppress the urge to cough. Swallowing water or using ice chips/non mint and menthol containing candies (such as lifesavers or sugarless jolly ranchers) are also effective.  You should rest your voice and avoid activities that you know make you cough. Once you have eliminated the cough for 3 straight days try reducing the tramadol first,  then the tessalon as tolerated Please see patient coordinator before you leave today  to schedule sinus and chest CT and fluoro > neg       10/18/2018   Pulmonary/ re-estabish/Shelly [name] re:  Tendency to cough sporadic pattern x years,  recurrent since March 2020 while on singulair  Chief Complaint  Patient presents with  . Pulmonary Consult    Self referral.  Pt states has had increased cough x 2 months.  Cough is currently non prod. She has not had to use her proair in the past wk.     was maint on singulair,  h1 x 2 hs and no gerd rx then acutely worse x 2 months s obvious trigger Typically year round pattern is that every few weeks the cough starts back up  Dyspnea:  MMRC1 = even on best edays  can walk nl pace, flat grade, can't hurry or go uphills or steps s sob   Cough: mostly dry / gag ? Sometime vomit when cough is severe and "almost pass out"  Sleeping: bed is flat / cough was bad at night better now  SABA use: saba helped as does prednisone  02: 02  Using lots of mints  acei intol ? When  d/c'd (started summer 2019)  rec Plan A = Automatic =Dulera 100 Take 2 puffs first thing in am and then another 2 puffs about 12 hours later and continue singulair 10 mg each pm  Work on inhaler technique:   Plan B = Backup Only use your albuterol inhaler as a rescue medication  Whenever cough start  prilosec 20 mg Take 30- 60 min before your first and last meals of the day until no longer coughing and  For drainage / throat tickle try take CHLORPHENIRAMINE  4 mg  (Chlortab 4mg   at [employer] should be easiest to find in the green box)  take one every 4 hours as needed - available over the counter- may cause drowsiness so start with just a bedtime dose or two and see how you tolerate it before trying in daytime      12/12/2018  f/u ov/Shelly [name] re:  Cough resolved on dulera 100 > symb 80 /4.5 plus singulair plus prn  h1   Chief Complaint  Patient presents with  . Follow-up    Breathing is doing well and no new co's today. She has not been using her albuterol inhaler.   Dyspnea:  MMRC1 = can walk nl pace, flat grade, can't hurry or go uphills or steps s sob   Cough: none Sleeping: no resp symptoms  SABA use: none 02: none   rec Please remember to go to the  x-ray department  for your tests - we will call you with the results when they are available . No change in medication - ok to stop singulair after surgery > never stopped  Remember for any flare of cough > add back nexium 20mg  Take 30- 60 min before your first and last meals of the day x until better for at least a week    03/25/2019  f/u ov/Shelly [name] re: cough variant asthma vs uacs on symb 80 2bid /singulair chlorphiramine flared p ET for back surgery  > assoc with ST  Chief Complaint  Patient presents with  . Follow-up    Increased cough x 10 days- non prod. She also has noticed minimal wheezing x 2 days.    Dyspnea:  Not limited by breathing from desired activities   Cough: dry, worse around 5 pm but does not wake her up noct, worse with laughter Sleeping: on side /bed flat better p cough drop hs and no noct cough  rec Prednisone 10 mg take  4 each am x 2 days,   2 each am x 2 days,  1 each am x 2 days and stop  Tessalon 200 mg up to four times daily as need for tickle/ throat cough  For drainage / throat tickle try take CHLORPHENIRAMINE  4 mg   GERD Call for follow up if not all better in 2 weeks    . Virtual Visit via Telephone Note 05/10/2019   I connected with Shelly [name] on 05/10/19 at  2:55  PM EST by telephone and verified that I am speaking with the correct person using two identifiers.   I discussed the limitations, risks, security and privacy concerns of performing [name] evaluation and management service by telephone and the availability of in person appointments. I also discussed with the patient that there may be a patient responsible charge related to this service. The patient expressed understanding and agreed to proceed.   History of Present Illness: recurrent cough/ did not not tolerate pred at last ov but says 100% resolution p the cough protocol which included 6 d pred Dyspnea:  Not limited by breathing from desired activities  /  talking makes her sob Cough: acutely worse x 24 hours / dry/ onset   p eating [place] food  Sleeping: once asleep does fine on L side s cough or wheeze or sob  SABA use: none on symb 80/singulair and no longer has tessalon or using ppi's at a;;  02: none    No obvious day to day or daytime variability or assoc excess/ purulent sputum or mucus plugs or hemoptysis or cp or chest tightness, subjective wheeze or overt sinus or hb symptoms.    Also denies any obvious fluctuation of symptoms with weather or environmental changes or other aggravating or alleviating factors except as outlined above.   Meds reviewed/ med reconciliation completed         Observations/Objective: slt hoarse, slt congested sounding but speaking in full sentences    Assessment and Plan: See problem list  for active a/p's   Follow Up Instructions: See avs for instructions unique to this ov which includes revised/ updated med list     I discussed the assessment and treatment plan with the patient. The patient was provided [name] opportunity to ask questions and all were answered. The patient agreed with the plan and demonstrated [name] understanding of the instructions.   The patient was advised to call back or seek [name] in-person evaluation if the symptoms worsen or if the condition fails to improve as anticipated.  I provided 25  minutes of non-face-to-face time during this encounter.   Christinia Gully, MD

## 2019-05-10 NOTE — Patient Instructions (Addendum)
Protonix Take 30-60 min before first meal of the day and nexium 20 mg Take 30-60 min before last meal of the day   Continue  Chlorpheniramine up to 2 x 4 mg every 4 hours to eliminate sense of pnds   GERD (REFLUX)  is an extremely common cause of respiratory symptoms just like yours , many times with no obvious heartburn at all.    It can be treated with medication, but also with lifestyle changes including elevation of the head of your bed (ideally with 6 -8inch blocks under the headboard of your bed),  Smoking cessation, avoidance of late meals, excessive alcohol, and avoid fatty foods, chocolate, peppermint, colas, red wine, and acidic juices such as orange juice.  NO MINT OR MENTHOL PRODUCTS SO NO COUGH DROPS  USE SUGARLESS CANDY INSTEAD (Jolley ranchers or Stover's or Life Savers) or even ice chips will also do - the key is to swallow to prevent all throat clearing. NO OIL BASED VITAMINS - use powdered substitutes.  Avoid fish oil when coughing.    To suppress urge to cough/ clear throat  > tessalon 200 mg up to 4 x daily  Follow up in 1-2 weeks if not improving

## 2019-05-10 NOTE — Assessment & Plan Note (Signed)
Onset Aug 2015 sporadic, severe, not seasonal - allergy profile 05/28/2014 > No eos, IgE 424 POS RAST only dust and mold  -  Sinus/chest  CT 06/03/2014 > wnl  - Trial of singulair 06/30/2014 >>>  ? Benefit  - 2019 proved intolerant of ACEi  - 10/18/2018  After extensive coaching inhaler device,  effectiveness =  75%> try dulera 100 2bid and 1st gen H1 blockers per guidelines    no mints > 100% resolution s gerd rx needed   - 05/10/2019 recurrence on symb 80/off ppi p [place] food > restart with just tessalon prn    Of the three most common causes of  Sub-acute / recurrent or chronic cough, only one (GERD)  can actually contribute to/ trigger  the other two (asthma and post nasal drip syndrome)  and perpetuate the cylce of cough.  While not intuitively obvious, many patients with chronic low grade reflux do not cough until there is a primary insult that disturbs the protective epithelial barrier and exposes sensitive nerve endings.     >>>  The point is that once this occurs, it is difficult to eliminate the cycle  using anything but a maximally effective acid suppression regimen at least in the short run, accompanied by an appropriate diet to address non acid GERD and control / eliminate the cough itself for at least 3 days with tessalon but avoid prednisone this time due to poor tolerance   Fairly convincing in this instance as to cause and effect in this setting so rec  1) reviewed diet including using hard rock candy to suppress urge to clear throat  2) max ppi  = bid ac   3) tessalon 200 mg up to qid to eliminate throat clearing if possible  4) no change symb 80 or singulair   5) f/u in 2 weeks if not  100% better.   Each maintenance medication was reviewed in detail including most importantly the difference between maintenance and as needed and under what circumstances the prns are to be used.  Please see AVS for specific  Instructions which are unique to this visit and I personally  typed out  which were reviewed in detail over the phone with the patient and a copy provided via MyChart

## 2019-05-20 DIAGNOSIS — H35342 Macular cyst, hole, or pseudohole, left eye: Secondary | ICD-10-CM | POA: Diagnosis not present

## 2019-05-20 DIAGNOSIS — H35413 Lattice degeneration of retina, bilateral: Secondary | ICD-10-CM | POA: Diagnosis not present

## 2019-05-24 DIAGNOSIS — M810 Age-related osteoporosis without current pathological fracture: Secondary | ICD-10-CM | POA: Diagnosis not present

## 2019-05-24 DIAGNOSIS — F325 Major depressive disorder, single episode, in full remission: Secondary | ICD-10-CM | POA: Diagnosis not present

## 2019-05-24 DIAGNOSIS — I10 Essential (primary) hypertension: Secondary | ICD-10-CM | POA: Diagnosis not present

## 2019-05-24 DIAGNOSIS — F329 Major depressive disorder, single episode, unspecified: Secondary | ICD-10-CM | POA: Diagnosis not present

## 2019-05-24 DIAGNOSIS — E785 Hyperlipidemia, unspecified: Secondary | ICD-10-CM | POA: Diagnosis not present

## 2019-05-24 DIAGNOSIS — G459 Transient cerebral ischemic attack, unspecified: Secondary | ICD-10-CM | POA: Diagnosis not present

## 2019-06-26 NOTE — Progress Notes (Signed)
Cardiology Office Note   Date:  06/28/2019   ID:  Shelly, [name] Oct 18, 1950, MRN [id number]  PCP:  Carol Ada, MD    No chief complaint on file.  Coronary calcium  Wt Readings from Last 3 Encounters:  06/28/19 191 lb 9.6 oz (86.9 kg)  03/25/19 188 lb 6.4 oz (85.5 kg)  01/10/19 184 lb 6 oz (83.6 kg)       History of Present Illness: Shelly [name] is a 69 y.o. female  Who had coronary calcium noted in 2015. She was asymptomatic and then had a stress test that was negative. She has RF for CAD including high cholesterol. Her brother had CABGin he past few years.  SHe had back surgeryin 2017and there were no cardiac issues at that time.   She saw vascular surgery as follows: "Shelly [name] a 69 y.o.femalereferred for evaluation after recent TIA event with right subclavian stenosis. On June 1 of this year the patient had an event where she had severe confusion and difficulty driving and understanding her actions. She was sent to the emergency room for evaluation and had a CT Angio of the neck which showed a right subclavian artery stenosis an MRI scan which showed no evidence of stroke but possible symptoms of TIA. She was also seen by her neurologist. She currently is on aspirin and Crestor. She denies any prior TIA events. 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 She did have an MRI in October 2018 which showed no infarct. "  Angio by Dr. Oneida Alar in 7/19 showed: "The patient apparently had a recent TIA event. CT scan suggested a high-grade right subclavian artery stenosis. She has symmetric blood pressures. Angiogram shows 40% of the most narrowing of the right subclavian artery. I do not believe that this is the etiology of her TIA. The rest of her great vessels are widely patent. I do not believe from a vascular standpoint we can explain her current TIA symptoms. I will defer this to her neurologist  Dr. [name] to see if any other further work-up or evaluation is necessary. From my standpoint it is okay for the patient to drive starting tomorrow.."  She has been checked for dementia.  She had some vascular changes in her brain  In Jan 2020, she was considering Keto diet.  I explained that from a heart standpoint, plant based and mediterranean diet have better data looking forward at reducing coronary events.   Since the last visit, she has felt well.  She has had cataract surgery, spinal cord stimulator in 2020.  Denies : Chest pain. Dizziness. Leg edema. Nitroglycerin use. Orthopnea. Palpitations. Paroxysmal nocturnal dyspnea. Shortness of breath. Syncope.   She walks with her dog, 20-25 miles/week.  Limited by leg pain, but not from a cardiac standpoint.  Past Medical History:  Diagnosis Date  . Anxiety   . Arthritis   . Asthma   . Avascular necrosis of talus ([place])   . Cancer ([place])    vulva pre cancer had surgery  . Depression   . Gait abnormality 03/20/2017  . GERD (gastroesophageal reflux disease)   . Hearing loss    "very minor"  . History of pneumonia   . Hyperlipidemia   . Hypertension   . Leg edema    left  . Memory difficulty 03/20/2017  . PONV (postoperative nausea and vomiting)   . Stress incontinence     Past Surgical History:  Procedure Laterality Date  . ANKLE FUSION  2011   right multiple   . ANKLE FUSION     rear ankle fusion  . ANKLE SURGERY  2010   right cordicompression  . AORTIC ARCH ANGIOGRAPHY N/A 12/11/2017   Procedure: AORTIC ARCH ANGIOGRAPHY;  Surgeon: Elam Dutch, MD;  Location: [facility];  Service: Cardiovascular;  Laterality: N/A;  . APPENDECTOMY  05/10/2016  . BELOW KNEE LEG AMPUTATION    . CATARACT EXTRACTION W/ INTRAOCULAR LENS IMPLANT Left   . LAPAROSCOPIC APPENDECTOMY N/A 05/10/2016   Procedure: APPENDECTOMY LAPAROSCOPIC;  Surgeon: Coralie Keens, MD;  Location: [facility];  Service: General;  Laterality: N/A;  . LUMBAR  LAMINECTOMY/DECOMPRESSION MICRODISCECTOMY N/A 09/09/2015   Procedure:  L4-S1 Decompression/ Discetomy;  Surgeon: Melina Schools, MD;  Location: [facility];  Service: Orthopedics;  Laterality: N/A;  . SKIN GRAFT    . SPINAL CORD STIMULATOR INSERTION N/A 01/10/2019   Procedure: LUMBAR SPINAL CORD STIMULATOR INSERTION;  Surgeon: Melina Schools, MD;  Location: [facility];  Service: Orthopedics;  Laterality: N/A;  2.5 hrs  . TONSILLECTOMY    . TUBAL LIGATION  1983  . VULVECTOMY N/A 01/28/2015   Procedure: WIDE EXCISION VULVECTOMY;  Surgeon: Thurnell Lose, MD;  Location: [facility];  Service: Gynecology;  Laterality: N/A;     Current Outpatient Medications  Medication Sig Dispense Refill  . acidophilus (RISAQUAD) CAPS capsule Take 1 capsule by mouth daily.    Marland Kitchen aspirin EC 81 MG tablet Take 162 mg by mouth daily.    . benzonatate (TESSALON) 200 MG capsule Take 1 capsule (200 mg total) by mouth 3 (three) times daily as needed for cough. 45 capsule 1  . Biotin 5000 MCG TABS Take 5,000 mcg by mouth every evening.    Marland Kitchen buPROPion (WELLBUTRIN XL) 300 MG 24 hr tablet Take 300 mg by mouth daily.      Marland Kitchen CALCIUM CITRATE PO Take 400 mg by mouth 2 (two) times a day.    . chlorpheniramine (CHLOR-TRIMETON) 4 MG tablet Take 8 mg by mouth 2 (two) times a day.     . Cholecalciferol (VITAMIN D PO) Take 5,000 Units by mouth daily.    . Cyanocobalamin (B-12 PO) Take 5,000 mcg by mouth daily.     Marland Kitchen denosumab (PROLIA) 60 MG/ML SOLN injection Inject 60 mg into the skin every 6 (six) months.     . desonide (DESOWEN) 0.05 % cream APPLY TO AFFECTED AREA TWICE A DAY    . FLUoxetine (PROZAC) 20 MG capsule Take 20 mg by mouth daily.    Marland Kitchen lisinopril (ZESTRIL) 20 MG tablet     . losartan (COZAAR) 50 MG tablet Take 50 mg by mouth daily.     . methocarbamol (ROBAXIN) 500 MG tablet Take 500 mg by mouth every 8 (eight) hours as needed.    . montelukast (SINGULAIR) 10 MG tablet Take 1 tablet (10 mg total) by mouth at bedtime. 30 tablet 11  .  Multiple Vitamins-Minerals (CENTRUM SILVER PO) Take 1 tablet by mouth daily.      . NYSTATIN powder Apply 1 application topically 2 (two) times daily as needed.    . pantoprazole (PROTONIX) 40 MG tablet Take 1 tablet (40 mg total) by mouth daily. Take 30-60 min before first meal of the day 30 tablet 2  . rosuvastatin (CRESTOR) 10 MG tablet Take 10 mg by mouth every evening.   1  . SYMBICORT 80-4.5 MCG/ACT inhaler INHALE 2 PUFFS INTO THE LUNGS 2 (TWO)  TIMES DAILY. 10.2 g 5  . traMADol (ULTRAM) 50 MG tablet Take 50 mg by mouth 3 (three) times daily as needed.    . valACYclovir (VALTREX) 1000 MG tablet Take 1,000 mg by mouth daily as needed.      No current facility-administered medications for this visit.    Allergies:   Patient has no known allergies.    Social History:  The patient  reports that she quit smoking about 12 years ago. Her smoking use included cigarettes. She has a 40.00 pack-year smoking history. She has never used smokeless tobacco. She reports current alcohol use. She reports that she does not use drugs.   Family History:  The patient's family history includes Allergies in her mother; Breast cancer in her sister; Dementia (age of onset: 78) in her mother; Fibromyalgia in her mother; Heart attack (age of onset: 35) in her father; Heart disease in her brother; Heart murmur (age of onset: 9) in her sister; Multiple myeloma in her father; Other in her brother; Other (age of onset: 48) in her sister.    ROS:  Please see the history of present illness.   Otherwise, review of systems are positive for back pain.   All other systems are reviewed and negative.    PHYSICAL EXAM: VS:  BP 130/86   Pulse 96   Ht '5\' 7"'$  (1.702 m)   Wt 191 lb 9.6 oz (86.9 kg)   LMP  (LMP Unknown)   SpO2 96%   BMI 30.01 kg/m  , BMI Body mass index is 30.01 kg/m. GEN: Well nourished, well developed, in no acute distress  HEENT: normal  Neck: no JVD, carotid bruits, or masses Cardiac: RRR; no murmurs,  rubs, or gallops,no edema  Respiratory:  clear to auscultation bilaterally, normal work of breathing GI: soft, nontender, nondistended, + BS MS: no deformity or atrophy  Skin: warm and dry, no rash Neuro:  Strength and sensation are intact Psych: euthymic mood, full affect   EKG:   The ekg ordered today demonstrates NSR, no ST changes   Recent Labs: 01/11/2019: ALT 21; BUN 15; Creatinine, Ser 0.95; Hemoglobin 10.4; Platelets 309; Potassium 3.8; Sodium 136   Lipid Panel No results found for: CHOL, TRIG, HDL, CHOLHDL, VLDL, LDLCALC, LDLDIRECT   Other studies Reviewed: Additional studies/ records that were reviewed today with results demonstrating: labs reviewed.   ASSESSMENT AND PLAN:  1. Coronary calcium: No angina. Continue aggressive secondary prevention.  2. Hyperlipidemia: LDL 85. COntinue lipid lowering therapy.  Lipids to be checked with her PMD.  Keto diet.  I encouraged her to try to avoid animal products and eat more plant products and high fiber foods.   Avoid processed foods. 3. HTN: The current medical regimen is effective;  continue present plan and medications.   4. PAD: subclavian stenosis, 40%.   Current medicines are reviewed at length with the patient today.  The patient concerns regarding her medicines were addressed.  The following changes have been made:  No change  Labs/ tests ordered today include:  No orders of the defined types were placed in this encounter.   Recommend 150 minutes/week of aerobic exercise Low fat, low carb, high fiber diet recommended  Disposition:   FU in 1 year   Signed, Larae Grooms, MD  06/28/2019 2:52 PM    [facility] [address], [place], [place]  [address] Phone: [phone]; Fax: [phone]

## 2019-06-27 ENCOUNTER — Other Ambulatory Visit: Payer: Self-pay | Admitting: Obstetrics and Gynecology

## 2019-06-27 DIAGNOSIS — Z1231 Encounter for screening mammogram for malignant neoplasm of breast: Secondary | ICD-10-CM

## 2019-06-27 DIAGNOSIS — M47816 Spondylosis without myelopathy or radiculopathy, lumbar region: Secondary | ICD-10-CM | POA: Diagnosis not present

## 2019-06-28 ENCOUNTER — Other Ambulatory Visit: Payer: Self-pay

## 2019-06-28 ENCOUNTER — Ambulatory Visit (INDEPENDENT_AMBULATORY_CARE_PROVIDER_SITE_OTHER): Payer: Medicare Other | Admitting: Interventional Cardiology

## 2019-06-28 ENCOUNTER — Encounter: Payer: Self-pay | Admitting: Interventional Cardiology

## 2019-06-28 VITALS — BP 130/86 | HR 96 | Ht 67.0 in | Wt 191.6 lb

## 2019-06-28 DIAGNOSIS — R600 Localized edema: Secondary | ICD-10-CM | POA: Diagnosis not present

## 2019-06-28 DIAGNOSIS — I2584 Coronary atherosclerosis due to calcified coronary lesion: Secondary | ICD-10-CM | POA: Diagnosis not present

## 2019-06-28 DIAGNOSIS — E785 Hyperlipidemia, unspecified: Secondary | ICD-10-CM | POA: Diagnosis not present

## 2019-06-28 DIAGNOSIS — I739 Peripheral vascular disease, unspecified: Secondary | ICD-10-CM | POA: Diagnosis not present

## 2019-06-28 DIAGNOSIS — I771 Stricture of artery: Secondary | ICD-10-CM | POA: Diagnosis not present

## 2019-06-28 DIAGNOSIS — I10 Essential (primary) hypertension: Secondary | ICD-10-CM | POA: Diagnosis not present

## 2019-06-28 DIAGNOSIS — I2510 Atherosclerotic heart disease of native coronary artery without angina pectoris: Secondary | ICD-10-CM

## 2019-06-28 NOTE — Patient Instructions (Signed)
Medication Instructions:  Your physician recommends that you continue on your current medications as directed. Please refer to the Current Medication list given to you today.  *If you need a refill on your cardiac medications before your next appointment, please call your pharmacy*  Lab Work: Fasting cholesterol to be checked with Primary Care Doctor  If you have labs (blood work) drawn today and your tests are completely normal, you will receive your results only by: Marland Kitchen MyChart Message (if you have MyChart) OR . A paper copy in the mail If you have any lab test that is abnormal or we need to change your treatment, we will call you to review the results.  Testing/Procedures: None ordered  Follow-Up: At [facility], you and your health needs are our priority.  As part of our continuing mission to provide you with exceptional heart care, we have created designated Provider Care Teams.  These Care Teams include your primary Cardiologist (physician) and Advanced Practice Providers (APPs -  Physician Assistants and Nurse Practitioners) who all work together to provide you with the care you need, when you need it.  Your next appointment:   12 month(s)  The format for your next appointment:   In Person  Provider:   You may see Larae Grooms, MD or one of the following Advanced Practice Providers on your designated Care Team:    Melina Copa, PA-C  Ermalinda Barrios, PA-C   Other Instructions

## 2019-07-01 ENCOUNTER — Other Ambulatory Visit: Payer: Self-pay

## 2019-07-01 ENCOUNTER — Ambulatory Visit
Admission: RE | Admit: 2019-07-01 | Discharge: 2019-07-01 | Disposition: A | Discharge status: Home / Self Care | Payer: Medicare Other | Source: Ambulatory Visit

## 2019-07-01 DIAGNOSIS — G459 Transient cerebral ischemic attack, unspecified: Secondary | ICD-10-CM | POA: Diagnosis not present

## 2019-07-01 DIAGNOSIS — E785 Hyperlipidemia, unspecified: Secondary | ICD-10-CM | POA: Diagnosis not present

## 2019-07-01 DIAGNOSIS — Z1231 Encounter for screening mammogram for malignant neoplasm of breast: Secondary | ICD-10-CM

## 2019-07-01 DIAGNOSIS — I10 Essential (primary) hypertension: Secondary | ICD-10-CM | POA: Diagnosis not present

## 2019-07-01 DIAGNOSIS — M810 Age-related osteoporosis without current pathological fracture: Secondary | ICD-10-CM | POA: Diagnosis not present

## 2019-07-01 DIAGNOSIS — F325 Major depressive disorder, single episode, in full remission: Secondary | ICD-10-CM | POA: Diagnosis not present

## 2019-07-01 DIAGNOSIS — F329 Major depressive disorder, single episode, unspecified: Secondary | ICD-10-CM | POA: Diagnosis not present

## 2019-07-01 MED ORDER — BUDESONIDE-FORMOTEROL FUMARATE 80-4.5 MCG/ACT IN AERO: 2.0000 | INHALATION_SPRAY | Freq: Two times a day (BID) | RESPIRATORY_TRACT | 6 refills | Status: DC

## 2019-07-03 MED ORDER — BENZONATATE 200 MG PO CAPS: 200.0000 mg | ORAL_CAPSULE | Freq: Three times a day (TID) | ORAL | 0 refills | Status: DC | PRN

## 2019-07-04 ENCOUNTER — Telehealth: Payer: Self-pay | Admitting: Internal Medicine

## 2019-07-04 DIAGNOSIS — H02012 Cicatricial entropion of right lower eyelid: Secondary | ICD-10-CM | POA: Diagnosis not present

## 2019-07-04 DIAGNOSIS — H11243 Scarring of conjunctiva, bilateral: Secondary | ICD-10-CM | POA: Diagnosis not present

## 2019-07-04 DIAGNOSIS — H02015 Cicatricial entropion of left lower eyelid: Secondary | ICD-10-CM | POA: Diagnosis not present

## 2019-07-04 NOTE — Telephone Encounter (Signed)
There is nothing similar The strongest non narcotic is delsym and if that's not adequate rec ov with all meds in hand

## 2019-07-04 NOTE — Telephone Encounter (Signed)
No call back number was left for [employer]. Pt aware of the denial of Tessalon (per email on 1/27). Per pt the rx is $64.   Dr. Melvyn Novas please advise if you would like to script an alternative to the Tessalon. Thanks.

## 2019-07-05 ENCOUNTER — Telehealth: Payer: Self-pay | Admitting: Internal Medicine

## 2019-07-05 NOTE — Telephone Encounter (Signed)
LVM for patient to advise insurance will not cover and the recommendation from Dr. Melvyn Novas. Requested call back if there are any questions of if the Delsym does not work.  Nothing further needed at this time.

## 2019-07-05 NOTE — Telephone Encounter (Signed)
Noted  

## 2019-07-11 DIAGNOSIS — N898 Other specified noninflammatory disorders of vagina: Secondary | ICD-10-CM | POA: Diagnosis not present

## 2019-07-11 DIAGNOSIS — M25552 Pain in left hip: Secondary | ICD-10-CM | POA: Diagnosis not present

## 2019-07-16 DIAGNOSIS — M7062 Trochanteric bursitis, left hip: Secondary | ICD-10-CM | POA: Diagnosis not present

## 2019-07-16 DIAGNOSIS — M25552 Pain in left hip: Secondary | ICD-10-CM | POA: Diagnosis not present

## 2019-07-29 DIAGNOSIS — G894 Chronic pain syndrome: Secondary | ICD-10-CM | POA: Diagnosis not present

## 2019-07-29 DIAGNOSIS — M542 Cervicalgia: Secondary | ICD-10-CM | POA: Diagnosis not present

## 2019-07-30 ENCOUNTER — Other Ambulatory Visit: Payer: Self-pay

## 2019-07-30 DIAGNOSIS — J45991 Cough variant asthma: Secondary | ICD-10-CM

## 2019-07-30 MED ORDER — PANTOPRAZOLE SODIUM 40 MG PO TBEC: 40.0000 mg | DELAYED_RELEASE_TABLET | Freq: Every day | ORAL | 3 refills | Status: DC

## 2019-07-31 DIAGNOSIS — M7062 Trochanteric bursitis, left hip: Secondary | ICD-10-CM | POA: Diagnosis not present

## 2019-08-07 DIAGNOSIS — M7062 Trochanteric bursitis, left hip: Secondary | ICD-10-CM | POA: Diagnosis not present

## 2019-08-16 DIAGNOSIS — I10 Essential (primary) hypertension: Secondary | ICD-10-CM | POA: Diagnosis not present

## 2019-08-16 DIAGNOSIS — E785 Hyperlipidemia, unspecified: Secondary | ICD-10-CM | POA: Diagnosis not present

## 2019-08-16 DIAGNOSIS — G459 Transient cerebral ischemic attack, unspecified: Secondary | ICD-10-CM | POA: Diagnosis not present

## 2019-08-16 DIAGNOSIS — F325 Major depressive disorder, single episode, in full remission: Secondary | ICD-10-CM | POA: Diagnosis not present

## 2019-08-16 DIAGNOSIS — M810 Age-related osteoporosis without current pathological fracture: Secondary | ICD-10-CM | POA: Diagnosis not present

## 2019-08-16 DIAGNOSIS — F329 Major depressive disorder, single episode, unspecified: Secondary | ICD-10-CM | POA: Diagnosis not present

## 2019-08-20 DIAGNOSIS — M7062 Trochanteric bursitis, left hip: Secondary | ICD-10-CM | POA: Diagnosis not present

## 2019-08-27 DIAGNOSIS — M25552 Pain in left hip: Secondary | ICD-10-CM | POA: Diagnosis not present

## 2019-08-27 DIAGNOSIS — M7062 Trochanteric bursitis, left hip: Secondary | ICD-10-CM | POA: Diagnosis not present

## 2019-09-03 ENCOUNTER — Other Ambulatory Visit: Payer: Self-pay | Admitting: Internal Medicine

## 2019-09-04 DIAGNOSIS — R2689 Other abnormalities of gait and mobility: Secondary | ICD-10-CM | POA: Diagnosis not present

## 2019-09-04 DIAGNOSIS — R519 Headache, unspecified: Secondary | ICD-10-CM | POA: Diagnosis not present

## 2019-09-04 DIAGNOSIS — M542 Cervicalgia: Secondary | ICD-10-CM | POA: Diagnosis not present

## 2019-09-04 DIAGNOSIS — R151 Fecal smearing: Secondary | ICD-10-CM | POA: Diagnosis not present

## 2019-09-04 DIAGNOSIS — M79601 Pain in right arm: Secondary | ICD-10-CM | POA: Diagnosis not present

## 2019-09-04 MED ORDER — BENZONATATE 200 MG PO CAPS: 200.0000 mg | ORAL_CAPSULE | Freq: Three times a day (TID) | ORAL | 0 refills | Status: DC | PRN

## 2019-09-04 NOTE — Telephone Encounter (Signed)
Dr.Wert please advise on patient's mychart message:  Hi Dr. Melvyn Novas,  I sent a request in to [facility] for a refill on my benzonanate.  They have sent you a request for a refill prescription and have not heard from you.  Could you please send them a new prescription?  Thank you,  Leisly [employer]) Lars Mage  August [birth year], 2048 [phone]

## 2019-09-04 NOTE — Telephone Encounter (Signed)
Ok to refill 

## 2019-09-11 DIAGNOSIS — M9903 Segmental and somatic dysfunction of lumbar region: Secondary | ICD-10-CM | POA: Diagnosis not present

## 2019-09-11 DIAGNOSIS — M9901 Segmental and somatic dysfunction of cervical region: Secondary | ICD-10-CM | POA: Diagnosis not present

## 2019-09-11 DIAGNOSIS — M5412 Radiculopathy, cervical region: Secondary | ICD-10-CM | POA: Diagnosis not present

## 2019-09-11 DIAGNOSIS — M9902 Segmental and somatic dysfunction of thoracic region: Secondary | ICD-10-CM | POA: Diagnosis not present

## 2019-09-13 DIAGNOSIS — M9903 Segmental and somatic dysfunction of lumbar region: Secondary | ICD-10-CM | POA: Diagnosis not present

## 2019-09-13 DIAGNOSIS — M9901 Segmental and somatic dysfunction of cervical region: Secondary | ICD-10-CM | POA: Diagnosis not present

## 2019-09-13 DIAGNOSIS — M5412 Radiculopathy, cervical region: Secondary | ICD-10-CM | POA: Diagnosis not present

## 2019-09-13 DIAGNOSIS — M9902 Segmental and somatic dysfunction of thoracic region: Secondary | ICD-10-CM | POA: Diagnosis not present

## 2019-09-16 DIAGNOSIS — M5412 Radiculopathy, cervical region: Secondary | ICD-10-CM | POA: Diagnosis not present

## 2019-09-16 DIAGNOSIS — M9902 Segmental and somatic dysfunction of thoracic region: Secondary | ICD-10-CM | POA: Diagnosis not present

## 2019-09-16 DIAGNOSIS — M9901 Segmental and somatic dysfunction of cervical region: Secondary | ICD-10-CM | POA: Diagnosis not present

## 2019-09-16 DIAGNOSIS — M9903 Segmental and somatic dysfunction of lumbar region: Secondary | ICD-10-CM | POA: Diagnosis not present

## 2019-09-17 ENCOUNTER — Encounter: Payer: Self-pay | Admitting: Physician Assistant

## 2019-09-17 ENCOUNTER — Other Ambulatory Visit: Payer: Self-pay

## 2019-09-17 ENCOUNTER — Ambulatory Visit (INDEPENDENT_AMBULATORY_CARE_PROVIDER_SITE_OTHER): Payer: Medicare Other | Admitting: Physician Assistant

## 2019-09-17 VITALS — Ht 67.0 in | Wt 191.0 lb

## 2019-09-17 DIAGNOSIS — Z89511 Acquired absence of right leg below knee: Secondary | ICD-10-CM | POA: Diagnosis not present

## 2019-09-17 NOTE — Progress Notes (Signed)
Office Visit Note   Patient: Shelly [name]           Date of Birth: 09/23/1950           MRN: [id number] Visit Date: 09/17/2019              Requested by: Carol Ada, [place],  [place] [id number] PCP: Carol Ada, MD  Chief Complaint  Patient presents with  . Right Leg - Follow-up    BKA       HPI: This is a pleasant woman who is 8 years status post right below-knee amputation for avascular necrosis done elsewhere.  She was last seen a little over a year ago at which time she was having difficulty with blisters and rashes on her amputation stump.  She has been using an anti per sprint to help with the sweating and has had a dramatic improvement.  She is here simply because her liners have worn out  Assessment & Plan: Visit Diagnoses: No diagnosis found.  Plan: A prescription for new liners and sleeves were given to her today.  She may follow-up as needed  Follow-Up Instructions: No follow-ups on file.   Ortho Exam  Patient is alert, oriented, no adenopathy, well-dressed, normal affect, normal respiratory effort. Focused examination of the amputation stump demonstrates no erythema no rash no open sores.  There is some soft tissue folding however no sores noted and this is on the posterior aspect.  No significant areas of pressure.  Her sleeves and liners are completely thread bare and worn out  Imaging: No results found. No images are attached to the encounter.  Labs: Lab Results  Component Value Date   ESRSEDRATE 2 03/20/2017   ESRSEDRATE 10 09/08/2010     Lab Results  Component Value Date   ALBUMIN 3.2 (L) 01/11/2019    Lab Results  Component Value Date   MG 1.7 02/12/2015   No results found for: VD25OH  No results found for: PREALBUMIN CBC EXTENDED Latest Ref Rng & Units 01/11/2019 01/02/2019 12/11/2017  WBC 4.0 - 10.5 K/uL 10.3 8.7 -  RBC 3.87 - 5.11 MIL/uL 3.47(L) 3.91 -  HGB 12.0 - 15.0 g/dL 10.4(L) 12.0 13.3  HCT  36.0 - 46.0 % 34.0(L) 37.8 39.0  PLT 150 - 400 K/uL 309 308 -  NEUTROABS 1.7 - 7.7 K/uL 7.0 - -  LYMPHSABS 0.7 - 4.0 K/uL 2.1 - -     Body mass index is 29.91 kg/m.  Orders:  No orders of the defined types were placed in this encounter.  No orders of the defined types were placed in this encounter.    Procedures: No procedures performed  Clinical Data: No additional findings.  ROS:  All other systems negative, except as noted in the HPI. Review of Systems  Objective: Vital Signs: Ht _0  (1.702 m)   Wt 191 lb (86.6 kg)   LMP  (LMP Unknown)   BMI 29.91 kg/m   Specialty Comments:  No specialty comments available.  PMFS History: Patient Active Problem List   Diagnosis Date Noted  . Upper airway cough syndrome 03/26/2019  . Chronic pain 01/10/2019  . Memory difficulty 03/20/2017  . Gait abnormality 03/20/2017  . History of right below knee amputation ([place]) 07/11/2016  . Adenomatous colon polyp 05/10/2016  . Coronary artery calcification 01/07/2016  . Essential hypertension 01/07/2016  . Back pain 09/09/2015  . Abnormal CXR 05/16/2014  . Cough variant asthma ? assoc  with UACS 05/15/2014  . Acute post-operative pain 02/18/2012  . Adiposity 02/17/2012  . Does use eyeglasses 02/13/2012  . H/O arthrodesis 10/21/2011  . Fibromyalgia 04/11/2011  . HLD (hyperlipidemia) 04/11/2011  . Leg edema 09/08/2010   Past Medical History:  Diagnosis Date  . Anxiety   . Arthritis   . Asthma   . Avascular necrosis of talus ([place])   . Cancer ([place])    vulva pre cancer had surgery  . Depression   . Gait abnormality 03/20/2017  . GERD (gastroesophageal reflux disease)   . Hearing loss    "very minor"  . History of pneumonia   . Hyperlipidemia   . Hypertension   . Leg edema    left  . Memory difficulty 03/20/2017  . PONV (postoperative nausea and vomiting)   . Stress incontinence     Family History  Problem Relation Age of Onset  . Dementia Mother 90       alive    . Fibromyalgia Mother   . Allergies Mother   . Heart attack Father 5       deceased  . Multiple myeloma Father   . Other Brother        alive  . Heart disease Brother        emergent CABG for 99% blocked CAD  . Heart murmur Sister 15       she had open heart surgery  . Other Sister 74       She is bIpolar- diabetic  . Breast cancer Sister     Past Surgical History:  Procedure Laterality Date  . ANKLE FUSION  2011   right multiple   . ANKLE FUSION     rear ankle fusion  . ANKLE SURGERY  2010   right cordicompression  . AORTIC ARCH ANGIOGRAPHY N/A 12/11/2017   Procedure: AORTIC ARCH ANGIOGRAPHY;  Surgeon: Elam Dutch, MD;  Location: [facility];  Service: Cardiovascular;  Laterality: N/A;  . APPENDECTOMY  05/10/2016  . BELOW KNEE LEG AMPUTATION    . CATARACT EXTRACTION W/ INTRAOCULAR LENS IMPLANT Left   . LAPAROSCOPIC APPENDECTOMY N/A 05/10/2016   Procedure: APPENDECTOMY LAPAROSCOPIC;  Surgeon: Coralie Keens, MD;  Location: [facility];  Service: General;  Laterality: N/A;  . LUMBAR LAMINECTOMY/DECOMPRESSION MICRODISCECTOMY N/A 09/09/2015   Procedure:  L4-S1 Decompression/ Discetomy;  Surgeon: Melina Schools, MD;  Location: [facility];  Service: Orthopedics;  Laterality: N/A;  . SKIN GRAFT    . SPINAL CORD STIMULATOR INSERTION N/A 01/10/2019   Procedure: LUMBAR SPINAL CORD STIMULATOR INSERTION;  Surgeon: Melina Schools, MD;  Location: [facility];  Service: Orthopedics;  Laterality: N/A;  2.5 hrs  . TONSILLECTOMY    . TUBAL LIGATION  1983  . VULVECTOMY N/A 01/28/2015   Procedure: WIDE EXCISION VULVECTOMY;  Surgeon: Thurnell Lose, MD;  Location: [facility];  Service: Gynecology;  Laterality: N/A;   Social History   Occupational History  . Occupation: ACCOUNTING    Employer: [employer]  Tobacco Use  . Smoking status: Former Smoker    Packs/day: 1.00    Years: 40.00    Pack years: 40.00    Types: Cigarettes    Quit date: 02/05/2007    Years since quitting: 12.6  . Smokeless  tobacco: Never Used  Substance and Sexual Activity  . Alcohol use: Yes    Alcohol/week: 0.0 standard drinks    Comment: socially  . Drug use: No  . Sexual activity: Not on file

## 2019-09-18 DIAGNOSIS — M5412 Radiculopathy, cervical region: Secondary | ICD-10-CM | POA: Diagnosis not present

## 2019-09-18 DIAGNOSIS — M9901 Segmental and somatic dysfunction of cervical region: Secondary | ICD-10-CM | POA: Diagnosis not present

## 2019-09-18 DIAGNOSIS — M9903 Segmental and somatic dysfunction of lumbar region: Secondary | ICD-10-CM | POA: Diagnosis not present

## 2019-09-18 DIAGNOSIS — M9902 Segmental and somatic dysfunction of thoracic region: Secondary | ICD-10-CM | POA: Diagnosis not present

## 2019-09-20 DIAGNOSIS — M9902 Segmental and somatic dysfunction of thoracic region: Secondary | ICD-10-CM | POA: Diagnosis not present

## 2019-09-20 DIAGNOSIS — M9903 Segmental and somatic dysfunction of lumbar region: Secondary | ICD-10-CM | POA: Diagnosis not present

## 2019-09-20 DIAGNOSIS — M9901 Segmental and somatic dysfunction of cervical region: Secondary | ICD-10-CM | POA: Diagnosis not present

## 2019-09-20 DIAGNOSIS — M5412 Radiculopathy, cervical region: Secondary | ICD-10-CM | POA: Diagnosis not present

## 2019-09-23 DIAGNOSIS — M9902 Segmental and somatic dysfunction of thoracic region: Secondary | ICD-10-CM | POA: Diagnosis not present

## 2019-09-23 DIAGNOSIS — D692 Other nonthrombocytopenic purpura: Secondary | ICD-10-CM | POA: Diagnosis not present

## 2019-09-23 DIAGNOSIS — R151 Fecal smearing: Secondary | ICD-10-CM | POA: Diagnosis not present

## 2019-09-23 DIAGNOSIS — M9901 Segmental and somatic dysfunction of cervical region: Secondary | ICD-10-CM | POA: Diagnosis not present

## 2019-09-23 DIAGNOSIS — M9903 Segmental and somatic dysfunction of lumbar region: Secondary | ICD-10-CM | POA: Diagnosis not present

## 2019-09-23 DIAGNOSIS — M5412 Radiculopathy, cervical region: Secondary | ICD-10-CM | POA: Diagnosis not present

## 2019-09-23 DIAGNOSIS — L299 Pruritus, unspecified: Secondary | ICD-10-CM | POA: Diagnosis not present

## 2019-09-24 DIAGNOSIS — G459 Transient cerebral ischemic attack, unspecified: Secondary | ICD-10-CM | POA: Diagnosis not present

## 2019-09-24 DIAGNOSIS — E785 Hyperlipidemia, unspecified: Secondary | ICD-10-CM | POA: Diagnosis not present

## 2019-09-24 DIAGNOSIS — M810 Age-related osteoporosis without current pathological fracture: Secondary | ICD-10-CM | POA: Diagnosis not present

## 2019-09-24 DIAGNOSIS — I10 Essential (primary) hypertension: Secondary | ICD-10-CM | POA: Diagnosis not present

## 2019-09-24 DIAGNOSIS — F329 Major depressive disorder, single episode, unspecified: Secondary | ICD-10-CM | POA: Diagnosis not present

## 2019-09-24 DIAGNOSIS — F325 Major depressive disorder, single episode, in full remission: Secondary | ICD-10-CM | POA: Diagnosis not present

## 2019-09-25 DIAGNOSIS — M9903 Segmental and somatic dysfunction of lumbar region: Secondary | ICD-10-CM | POA: Diagnosis not present

## 2019-09-25 DIAGNOSIS — M9902 Segmental and somatic dysfunction of thoracic region: Secondary | ICD-10-CM | POA: Diagnosis not present

## 2019-09-25 DIAGNOSIS — M9901 Segmental and somatic dysfunction of cervical region: Secondary | ICD-10-CM | POA: Diagnosis not present

## 2019-09-25 DIAGNOSIS — M5412 Radiculopathy, cervical region: Secondary | ICD-10-CM | POA: Diagnosis not present

## 2019-09-27 DIAGNOSIS — M9903 Segmental and somatic dysfunction of lumbar region: Secondary | ICD-10-CM | POA: Diagnosis not present

## 2019-09-27 DIAGNOSIS — M9902 Segmental and somatic dysfunction of thoracic region: Secondary | ICD-10-CM | POA: Diagnosis not present

## 2019-09-27 DIAGNOSIS — M9901 Segmental and somatic dysfunction of cervical region: Secondary | ICD-10-CM | POA: Diagnosis not present

## 2019-09-27 DIAGNOSIS — M5412 Radiculopathy, cervical region: Secondary | ICD-10-CM | POA: Diagnosis not present

## 2019-09-30 DIAGNOSIS — M9902 Segmental and somatic dysfunction of thoracic region: Secondary | ICD-10-CM | POA: Diagnosis not present

## 2019-09-30 DIAGNOSIS — M5412 Radiculopathy, cervical region: Secondary | ICD-10-CM | POA: Diagnosis not present

## 2019-09-30 DIAGNOSIS — M9903 Segmental and somatic dysfunction of lumbar region: Secondary | ICD-10-CM | POA: Diagnosis not present

## 2019-09-30 DIAGNOSIS — M9901 Segmental and somatic dysfunction of cervical region: Secondary | ICD-10-CM | POA: Diagnosis not present

## 2019-10-02 DIAGNOSIS — M9901 Segmental and somatic dysfunction of cervical region: Secondary | ICD-10-CM | POA: Diagnosis not present

## 2019-10-02 DIAGNOSIS — M9902 Segmental and somatic dysfunction of thoracic region: Secondary | ICD-10-CM | POA: Diagnosis not present

## 2019-10-02 DIAGNOSIS — M5412 Radiculopathy, cervical region: Secondary | ICD-10-CM | POA: Diagnosis not present

## 2019-10-02 DIAGNOSIS — M9903 Segmental and somatic dysfunction of lumbar region: Secondary | ICD-10-CM | POA: Diagnosis not present

## 2019-10-03 DIAGNOSIS — H02054 Trichiasis without entropian left upper eyelid: Secondary | ICD-10-CM | POA: Diagnosis not present

## 2019-10-03 DIAGNOSIS — H02051 Trichiasis without entropian right upper eyelid: Secondary | ICD-10-CM | POA: Diagnosis not present

## 2019-10-07 DIAGNOSIS — M9901 Segmental and somatic dysfunction of cervical region: Secondary | ICD-10-CM | POA: Diagnosis not present

## 2019-10-07 DIAGNOSIS — M9903 Segmental and somatic dysfunction of lumbar region: Secondary | ICD-10-CM | POA: Diagnosis not present

## 2019-10-07 DIAGNOSIS — M5412 Radiculopathy, cervical region: Secondary | ICD-10-CM | POA: Diagnosis not present

## 2019-10-07 DIAGNOSIS — M9902 Segmental and somatic dysfunction of thoracic region: Secondary | ICD-10-CM | POA: Diagnosis not present

## 2019-10-09 ENCOUNTER — Other Ambulatory Visit: Payer: Self-pay

## 2019-10-09 DIAGNOSIS — M9901 Segmental and somatic dysfunction of cervical region: Secondary | ICD-10-CM | POA: Diagnosis not present

## 2019-10-09 DIAGNOSIS — M9902 Segmental and somatic dysfunction of thoracic region: Secondary | ICD-10-CM | POA: Diagnosis not present

## 2019-10-09 DIAGNOSIS — M5412 Radiculopathy, cervical region: Secondary | ICD-10-CM | POA: Diagnosis not present

## 2019-10-09 DIAGNOSIS — M9903 Segmental and somatic dysfunction of lumbar region: Secondary | ICD-10-CM | POA: Diagnosis not present

## 2019-10-09 MED ORDER — ASPIRIN EC 81 MG PO TBEC: 81.0000 mg | DELAYED_RELEASE_TABLET | Freq: Every day | ORAL | 3 refills | Status: DC

## 2019-10-09 NOTE — Telephone Encounter (Signed)
Med list updated

## 2019-10-14 DIAGNOSIS — M9901 Segmental and somatic dysfunction of cervical region: Secondary | ICD-10-CM | POA: Diagnosis not present

## 2019-10-14 DIAGNOSIS — M5412 Radiculopathy, cervical region: Secondary | ICD-10-CM | POA: Diagnosis not present

## 2019-10-14 DIAGNOSIS — M9903 Segmental and somatic dysfunction of lumbar region: Secondary | ICD-10-CM | POA: Diagnosis not present

## 2019-10-14 DIAGNOSIS — M9902 Segmental and somatic dysfunction of thoracic region: Secondary | ICD-10-CM | POA: Diagnosis not present

## 2019-10-15 ENCOUNTER — Telehealth: Payer: Self-pay | Admitting: Orthopedic Surgery

## 2019-10-15 NOTE — Telephone Encounter (Signed)
I called pt to advise that this rx was given to her on the day of her appt. Pt states that she does remember and she did give this rx to the prosthetic company the same day. She will call with any other concerns.

## 2019-10-15 NOTE — Telephone Encounter (Signed)
Pt called stating she had an appt with Audrea Muscat on 09/17/19 and was told a prescription for liners and a sleeve would be put in but the pt states the prescription was never sent; the pt would like for this to be done today. Pt would also like a call back when this has been completed.   [phone]

## 2019-10-16 DIAGNOSIS — M9901 Segmental and somatic dysfunction of cervical region: Secondary | ICD-10-CM | POA: Diagnosis not present

## 2019-10-16 DIAGNOSIS — M810 Age-related osteoporosis without current pathological fracture: Secondary | ICD-10-CM | POA: Diagnosis not present

## 2019-10-16 DIAGNOSIS — M5412 Radiculopathy, cervical region: Secondary | ICD-10-CM | POA: Diagnosis not present

## 2019-10-16 DIAGNOSIS — M9903 Segmental and somatic dysfunction of lumbar region: Secondary | ICD-10-CM | POA: Diagnosis not present

## 2019-10-16 DIAGNOSIS — M9902 Segmental and somatic dysfunction of thoracic region: Secondary | ICD-10-CM | POA: Diagnosis not present

## 2019-10-21 DIAGNOSIS — M5412 Radiculopathy, cervical region: Secondary | ICD-10-CM | POA: Diagnosis not present

## 2019-10-21 DIAGNOSIS — M9903 Segmental and somatic dysfunction of lumbar region: Secondary | ICD-10-CM | POA: Diagnosis not present

## 2019-10-21 DIAGNOSIS — M9901 Segmental and somatic dysfunction of cervical region: Secondary | ICD-10-CM | POA: Diagnosis not present

## 2019-10-21 DIAGNOSIS — M9902 Segmental and somatic dysfunction of thoracic region: Secondary | ICD-10-CM | POA: Diagnosis not present

## 2019-10-23 ENCOUNTER — Telehealth: Payer: Self-pay | Admitting: *Deleted

## 2019-10-23 DIAGNOSIS — M9901 Segmental and somatic dysfunction of cervical region: Secondary | ICD-10-CM | POA: Diagnosis not present

## 2019-10-23 DIAGNOSIS — M5412 Radiculopathy, cervical region: Secondary | ICD-10-CM | POA: Diagnosis not present

## 2019-10-23 DIAGNOSIS — M9903 Segmental and somatic dysfunction of lumbar region: Secondary | ICD-10-CM | POA: Diagnosis not present

## 2019-10-23 DIAGNOSIS — M9902 Segmental and somatic dysfunction of thoracic region: Secondary | ICD-10-CM | POA: Diagnosis not present

## 2019-10-23 MED ORDER — BENZONATATE 200 MG PO CAPS: 200.0000 mg | ORAL_CAPSULE | Freq: Three times a day (TID) | ORAL | 0 refills | Status: DC | PRN

## 2019-10-23 NOTE — Telephone Encounter (Signed)
Dr.Wert can you please advise. Thank you     Patient stated:  Dr. Melvyn Novas,  Hi, I requested a refill on Benzonanate last Friday from [facility].  It required authorization and they sent it over to you then.  I checked Monday, they still didn't have it.  They refaxed.  I called today and they still didn't have it.  I am totally out and it seems to be mostly because of the pollen etc. that we are dealing with.  Could you please give me a new 90 day supply?  Thank you, Shelly [name] 2050/06/30 [phone]

## 2019-10-23 NOTE — Telephone Encounter (Signed)
Ok x one, needs ov for any more or she can ask  PCP to fill or see one of our NPs

## 2019-10-23 NOTE — Telephone Encounter (Signed)
Rx sent in. Nothing further is needed.

## 2019-10-24 NOTE — Telephone Encounter (Signed)
I already responded to this as message as a phone call

## 2019-10-28 DIAGNOSIS — M9903 Segmental and somatic dysfunction of lumbar region: Secondary | ICD-10-CM | POA: Diagnosis not present

## 2019-10-28 DIAGNOSIS — M9901 Segmental and somatic dysfunction of cervical region: Secondary | ICD-10-CM | POA: Diagnosis not present

## 2019-10-28 DIAGNOSIS — M5412 Radiculopathy, cervical region: Secondary | ICD-10-CM | POA: Diagnosis not present

## 2019-10-28 DIAGNOSIS — M9902 Segmental and somatic dysfunction of thoracic region: Secondary | ICD-10-CM | POA: Diagnosis not present

## 2019-10-31 DIAGNOSIS — M9901 Segmental and somatic dysfunction of cervical region: Secondary | ICD-10-CM | POA: Diagnosis not present

## 2019-10-31 DIAGNOSIS — M9903 Segmental and somatic dysfunction of lumbar region: Secondary | ICD-10-CM | POA: Diagnosis not present

## 2019-10-31 DIAGNOSIS — M5412 Radiculopathy, cervical region: Secondary | ICD-10-CM | POA: Diagnosis not present

## 2019-10-31 DIAGNOSIS — M9902 Segmental and somatic dysfunction of thoracic region: Secondary | ICD-10-CM | POA: Diagnosis not present

## 2019-11-13 ENCOUNTER — Other Ambulatory Visit: Payer: Self-pay | Admitting: Internal Medicine

## 2019-11-14 ENCOUNTER — Encounter: Payer: Self-pay | Admitting: Internal Medicine

## 2019-11-14 ENCOUNTER — Ambulatory Visit (INDEPENDENT_AMBULATORY_CARE_PROVIDER_SITE_OTHER): Payer: Medicare Other | Admitting: Internal Medicine

## 2019-11-14 ENCOUNTER — Other Ambulatory Visit: Payer: Self-pay

## 2019-11-14 DIAGNOSIS — J45991 Cough variant asthma: Secondary | ICD-10-CM | POA: Diagnosis not present

## 2019-11-14 DIAGNOSIS — I2584 Coronary atherosclerosis due to calcified coronary lesion: Secondary | ICD-10-CM | POA: Diagnosis not present

## 2019-11-14 DIAGNOSIS — I2510 Atherosclerotic heart disease of native coronary artery without angina pectoris: Secondary | ICD-10-CM | POA: Diagnosis not present

## 2019-11-14 MED ORDER — PREDNISONE 10 MG PO TABS: ORAL_TABLET | ORAL | 0 refills | Status: DC

## 2019-11-14 MED ORDER — TRAMADOL HCL 50 MG PO TABS: 50.0000 mg | ORAL_TABLET | ORAL | 0 refills | Status: AC | PRN

## 2019-11-14 MED ORDER — BENZONATATE 200 MG PO CAPS: 200.0000 mg | ORAL_CAPSULE | Freq: Three times a day (TID) | ORAL | 11 refills | Status: DC | PRN

## 2019-11-14 NOTE — Progress Notes (Signed)
Subjective:    Patient ID: Shelly [name], female    DOB: 1950-11-04       MRN: [id number]    Brief patient profile:  35  yowf mother  of optimetrist  quit smoking 2008  watery eyes in her 98s year round ? Worse in winter eventually started on zyrtec 2013 and seemed to help s assoc cough/ wheeze then new onset cough Aug 2015 and proved refractory so referred to pulmonary clinic 05/15/14  by Dr Jeremy Johann    History of Present Illness  05/15/2014 1st [facility] office visit/ Ragina Fenter   Chief Complaint  Patient presents with  . Pulmonary Consult    Referred by Dr. Carol Ada. Pt c/o cough since August 2015- cough is non prod and somtimes seems worse at night. She also c/o DOE with walking up stairs.    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   Prednisone 10 mg take  4 each am x 2 days,   2 each am x 2 days,  1 each am x 2 days and stop (this is to eliminate allergies and inflammation from coughing) Omeprazole 40 mg  Take 30-60 min before first meal of the day and Pepcid 20 mg one bedtime plus chlorpheniramine 4 mg x 2 at bedtime  GERD diet reviewed     05/28/2014 f/u ov/Azlee Monforte re: cough since aug 2015 Chief Complaint  Patient presents with  . Acute Visit    Pt states that after last visit her cough had pretty much resolved, but then started back about 1 wk ago.  Cough is non prod and "comes and goes".   95% better, still urge to  clear throat daytime on above rx/ confused about when to d/c what  No noct cough  at all  Sob doe x steps better Then gradually worse x one week , again dry and variable   Kouffman Reflux v Neurogenic Cough Differentiator Reflux Comments  Do you awaken from a sound sleep coughing violently?                            With trouble breathing? Not now   Do you have choking episodes when you cannot  Get enough air, gasping for air ?              Not now   Do you usually cough when you lie down into  The bed, or when you just lie down to rest ?                          Not now    Do you usually cough after meals or eating?         No   Do you cough when (or after) you bend over?    Still some  GERD SCORE     Kouffman Reflux v Neurogenic Cough Differentiator Neurogenic   Do you more-or-less cough all day long? no   Does change of temperature make you cough? no   Does laughing or chuckling cause you to cough? Still some   Do fumes (perfume, automobile fumes, burned  Toast, etc.,) cause you to cough ?      no   Does speaking, singing, or talking on the phone cause you to cough   ?               Not now   Neurogenic/Airway score       rec Prednisone 10 mg take  4 each am x 2 days,   2 each am x 2 days,  1 each am x 2 days and stop  Take Tessalon 4x daily  and supplement if needed with  tramadol 50 mg up to 2 every 4 hours to suppress the urge to cough. Swallowing water or using ice chips/non mint and menthol containing candies (such as lifesavers or sugarless jolly ranchers) are also effective.  You should rest your voice and avoid activities that you know make you cough. Once you have eliminated the cough for 3 straight days try reducing the tramadol first,  then the tessalon as tolerated Please see patient coordinator before you leave today  to schedule sinus and chest CT and fluoro > neg       10/18/2018   Pulmonary/ re-estabish/Aymar Whitfill re:  Tendency to cough sporadic pattern x years,  recurrent since March 2020 while on singulair  Chief Complaint  Patient presents with  . Pulmonary Consult    Self referral.  Pt states has had increased cough x 2 months.  Cough is currently non prod. She has not had to use her proair in the past wk.     was maint on singulair,  h1 x 2 hs and no gerd rx then acutely worse x 2 months s obvious trigger Typically year round pattern is that every few weeks the cough starts back up  Dyspnea:  MMRC1 = even on best edays  can walk nl pace, flat grade, can't hurry or go uphills or steps s sob   Cough: mostly dry / gag ? Sometime vomit when cough is severe and "almost pass out"  Sleeping: bed is flat / cough was bad at night better now  SABA use: saba helped as does prednisone  02: 02  Using lots of mints  acei intol ? When  d/c'd (started summer 2019)  rec Plan A = Automatic =Dulera 100 Take 2 puffs first thing in am and then another 2 puffs about 12 hours later and continue singulair 10 mg each pm  Work on inhaler technique:   Plan B = Backup Only use your albuterol inhaler as a rescue medication  Whenever cough start  prilosec 20 mg Take 30- 60 min before your first and last meals of the day until no longer coughing and  For drainage / throat tickle try take CHLORPHENIRAMINE  4 mg  (Chlortab 4mg   at [employer] should be easiest to find in the green box)  take one every 4 hours as needed - available over the counter- may cause drowsiness so start with just a bedtime dose or two and see how you tolerate it before trying in daytime      12/12/2018  f/u ov/Laylynn Campanella re:  Cough resolved on dulera 100 > symb 80 /4.5 plus singulair plus prn  h1   Chief Complaint  Patient presents with  . Follow-up    Breathing is doing well and no new co's today. She has not been using her albuterol inhaler.   Dyspnea:  MMRC1 = can walk nl pace, flat grade, can't hurry or go uphills or steps s sob   Cough: none Sleeping: no resp symptoms  SABA use: none 02: none   rec Please remember to go to the  x-ray department  for your tests - we will call you with the results when they are available . No change in medication - ok to stop singulair after surgery > never stopped  Remember for any flare of cough > add back nexium 20mg  Take 30- 60 min before your first and last meals of the day x until better for at least a week    03/25/2019  f/u ov/Micharl Helmes re: cough variant asthma vs uacs on symb 80 2bid /singulair chlorphiramine flared p ET for back surgery  > assoc with ST  Chief Complaint  Patient presents with  . Follow-up    Increased cough x 10 days- non prod. She also has noticed minimal wheezing x 2 days.    Dyspnea:  Not limited by breathing from desired activities   Cough: dry, worse around 5 pm but does not wake her up noct, worse with laughter Sleeping: on side /bed flat better p cough drop hs and no noct cough  SABA use: none  02: none  rec Protonix Take 30-60 min before first meal of the day and nexium 20 mg Take 30-60 min before last meal of the day  Continue  Chlorpheniramine up to 2 x 4 mg every 4 hours to eliminate sense of pnds  GERD  To suppress urge to cough/ clear throat  > tessalon 200 mg up to 4 x daily rec  Protonix Take 30-60 min before first meal of the day and nexium 20 mg Take 30-60 min before last meal of the day  Continue  Chlorpheniramine up to 2 x 4 mg every 4 hours to eliminate sense of pnds  GERD. To suppress urge to cough/ clear throat  > tessalon 200 mg up to 4 x daily Follow up in 1-2 weeks if not improving      11/14/2019  f/u ov/Catheryn Slifer re: uacs vs cough variant asthma flared while on pantoprarzole / h1 bid and singulair and no symb or pm nexium  But added these back p onset of cough 2 weeks prior to OV  And no better  Chief Complaint  Patient presents with  . Follow-up    coughing more 2 weeks  acute routine : start back on nexium/ symbicrt 80 2bid/ tesssilon  Dyspnea:  Only if coughing  Cough: very harsh to point  of presyncope /  Sleeping on side/ bed is flat/ and one head pillow SABA use: none  02: none  No tramadol x 2 weeks    No obvious day to day or daytime variability or assoc excess/ purulent sputum or mucus plugs or hemoptysis or cp or chest tightness, subjective wheeze or overt sinus or hb symptoms.   Sleeping  without nocturnal  or early am exacerbation  of respiratory  c/o's or need for noct saba. Also denies any obvious fluctuation of symptoms with weather or environmental changes or other aggravating or alleviating factors except as outlined above   No unusual exposure hx or h/o childhood pna/ asthma or knowledge of premature birth.  Current Allergies, Complete Past  Medical History, Past Surgical History, Family History, and Social History were reviewed in [employer] record.  ROS  The following are not active complaints unless bolded Hoarseness, sore throat, dysphagia, dental problems, itching, sneezing,  nasal congestion or discharge of excess mucus or purulent secretions, ear ache,   fever, chills, sweats, unintended wt loss or wt gain, classically pleuritic or exertional cp,  orthopnea pnd or arm/hand swelling  or leg swelling, presyncope, palpitations, abdominal pain, anorexia, nausea, vomiting, diarrhea  or change in bowel habits or change in bladder habits, change in stools or change in urine, dysuria, hematuria,  rash, arthralgias, visual complaints, headache, numbness, weakness or ataxia or problems with walking or coordination,  change in mood or  memory.        Current Meds  Medication Sig  . acidophilus (RISAQUAD) CAPS capsule Take 1 capsule by mouth daily.  Marland Kitchen aspirin EC 81 MG tablet Take 1 tablet (81 mg total) by mouth daily.  . benzonatate (TESSALON) 200 MG capsule Take 1 capsule (200 mg total) by mouth 3 (three) times daily as needed for cough.  . Biotin 5000 MCG TABS Take 5,000 mcg by mouth every evening.  . budesonide-formoterol (SYMBICORT) 80-4.5  MCG/ACT inhaler Inhale 2 puffs into the lungs 2 (two) times daily.  Marland Kitchen buPROPion (WELLBUTRIN XL) 300 MG 24 hr tablet Take 300 mg by mouth daily.    Marland Kitchen CALCIUM CITRATE PO Take 400 mg by mouth 2 (two) times a day.  . chlorpheniramine (CHLOR-TRIMETON) 4 MG tablet Take 8 mg by mouth 2 (two) times a day.   . Cholecalciferol (VITAMIN D PO) Take 5,000 Units by mouth daily.  . Cyanocobalamin (B-12 PO) Take 5,000 mcg by mouth daily.   Marland Kitchen denosumab (PROLIA) 60 MG/ML SOLN injection Inject 60 mg into the skin every 6 (six) months.   . desonide (DESOWEN) 0.05 % cream APPLY TO AFFECTED AREA TWICE A DAY  . doxycycline (VIBRA-TABS) 100 MG tablet Take by mouth.  . esomeprazole (NEXIUM) 20 MG capsule Take by mouth.  Marland Kitchen FLUoxetine (PROZAC) 20 MG capsule Take 20 mg by mouth daily.  . Folic Acid-Cholecalciferol 06-4998 MG-UNIT TABS Take by mouth.  Marland Kitchen lisinopril (ZESTRIL) 20 MG tablet   . losartan (COZAAR) 50 MG tablet Take 50 mg by mouth daily.   . metaxalone (SKELAXIN) 800 MG tablet Take by mouth.  . methocarbamol (ROBAXIN) 500 MG tablet Take 500 mg by mouth every 8 (eight) hours as needed.  . montelukast (SINGULAIR) 10 MG tablet Take 1 tablet (10 mg total) by mouth at bedtime.  . Multiple Vitamins-Minerals (CENTRUM SILVER PO) Take 1 tablet by mouth daily.    . NYSTATIN powder Apply 1 application topically 2 (two) times daily as needed.  . pantoprazole (PROTONIX) 40 MG tablet Take 1 tablet (40 mg total) by mouth daily. Take 30-60 min before first meal of the day  . rosuvastatin (CRESTOR) 10 MG tablet Take 10 mg by mouth every evening.   . SYMBICORT 80-4.5 MCG/ACT inhaler INHALE 2 PUFFS INTO THE LUNGS 2 (TWO) TIMES DAILY.  . traMADol (ULTRAM) 50 MG tablet Take 50 mg by mouth 3 (three) times daily as needed.  . valACYclovir (VALTREX) 1000 MG tablet Take 1,000 mg by mouth daily as needed.                               Objective:  Physical Exam  amb wf nad with mild pseudowheeze resolves with  plm     11/14/2019      184 03/25/2019     188  12/12/2018         188  05/28/2014      208 >  06/11/14  206 >   06/30/2014 207  > 10/18/2018  187           05/15/14 207 lb (93.895 kg)  04/29/13 193 lb (87.544 kg)  09/08/10 195 lb (88.451 kg)       Vital signs reviewed  11/14/2019  - Note at rest 02 sats  94% on RA        HEENT : pt wearing mask not removed for exam due to covid -19 concerns.    NECK :  without JVD/Nodes/TM/ nl carotid upstrokes bilaterally   LUNGS: no acc muscle use,  Nl contour chest which is clear to A and P bilaterally without cough on insp or exp maneuvers   CV:  RRR  no s3 or murmur or increase in P2, and no edema   ABD:  soft and nontender with nl inspiratory excursion in the supine position. No bruits or organomegaly appreciated, bowel sounds nl  MS:  Nl gait/ ext warm with  R BKA prosthesis   , calf tenderness, cyanosis or clubbing No obvious joint restrictions   SKIN: warm and dry without lesions    NEURO:  alert, approp, nl sensorium with  no motor or cerebellar deficits apparent.       I personally reviewed images and agree with radiology impression as follows:   Chest CTa 01/11/2019 nl x for Mostly calcified mass within the superior segment of the left lower lobe measures 1 cm, likely benign, stable from 2015.   Assessment & Plan:

## 2019-11-14 NOTE — Patient Instructions (Addendum)
Take delsym two tsp every 12 hours and supplement if needed with  tramadol 50 mg up to 1-2 every 4 hours to suppress the urge to cough. Swallowing water and/or using ice chips/non mint and menthol containing candies (such as lifesavers or sugarless jolly ranchers) are also effective.  You should rest your voice and avoid activities that you know make you cough.  Once you have eliminated the cough for 3 straight days try reducing the tramadol first,  then the delsym as tolerated.    Hold prozac if taking high doses of tramadol    GERD (REFLUX)  is an extremely common cause of respiratory symptoms just like yours , many times with no obvious heartburn at all.    It can be treated with medication, but also with lifestyle changes including elevation of the head of your bed (ideally with 6 -8inch blocks under the headboard of your bed),  Smoking cessation, avoidance of late meals, excessive alcohol, and avoid fatty foods, chocolate, peppermint, colas, red wine, and acidic juices such as orange juice.  NO MINT OR MENTHOL PRODUCTS SO NO COUGH DROPS  USE SUGARLESS CANDY INSTEAD (Jolley ranchers or Stover's or Life Savers) or even ice chips will also do - the key is to swallow to prevent all throat clearing. NO OIL BASED VITAMINS - use powdered substitutes.  Avoid fish oil when coughing.  If not better >>  Prednisone 10 mg take  4 each am x 2 days,   2 each am x 2 days,  1 each am x 2 days and stop

## 2019-11-15 ENCOUNTER — Encounter: Payer: Self-pay | Admitting: Internal Medicine

## 2019-11-15 NOTE — Assessment & Plan Note (Signed)
Onset Aug 2015 sporadic, severe, not seasonal - allergy profile 05/28/2014 > No eos, IgE 424 POS RAST only dust and mold  -  Sinus/chest  CT 06/03/2014 > wnl  - Trial of singulair 06/30/2014 >>>  ? Benefit  - 2019 proved intolerant of ACEi  - 10/18/2018  After extensive coaching inhaler device,  effectiveness =  75%> try dulera 100 2bid and 1st gen H1 blockers per guidelines    no mints > 100% resolution s gerd rx needed  - 05/10/2019 recurrence on symb 80/off ppi p [place] [employer] > restart with just tessalon prn  - recurred late may 2020 while maint on  pantoprarzole / h1 bid and singulair  Recurred despite rx directed at rhintis / gerd as maint and no better after added back symbicort.  Of the three most common causes of  Sub-acute / recurrent or chronic cough, only one (GERD)  can actually contribute to/ trigger  the other two (asthma and post nasal drip syndrome)  and perpetuate the cylce of cough.  While not intuitively obvious, many patients with chronic low grade reflux do not cough until there is a primary insult that disturbs the protective epithelial barrier and exposes sensitive nerve endings.   This is typically viral but can due to PNDS and  either may apply here.   The point is that once this occurs, it is difficult to eliminate the cycle  using anything but a maximally effective acid suppression regimen at least in the short run, accompanied by an appropriate diet to address non acid GERD and control / eliminate the cough itself for at least 3 days with tramadol and if not better  Also rec then add 6 days of Prednisone in case of component of Th-2 driven upper or lower airways inflammation (if cough responds short term only to relapse befor return while will on rx for uacs that would point to allergic rhinitis/ asthma or eos bronchitis)            Each maintenance medication was reviewed in detail including emphasizing most importantly the difference between maintenance and prns and  under what circumstances the prns are to be triggered using an action plan format where appropriate.  Total time for H and P, chart review, counseling, teaching device and generating customized AVS unique to this office visit / charting = 20 min

## 2019-12-04 DIAGNOSIS — G459 Transient cerebral ischemic attack, unspecified: Secondary | ICD-10-CM | POA: Diagnosis not present

## 2019-12-04 DIAGNOSIS — M810 Age-related osteoporosis without current pathological fracture: Secondary | ICD-10-CM | POA: Diagnosis not present

## 2019-12-04 DIAGNOSIS — I10 Essential (primary) hypertension: Secondary | ICD-10-CM | POA: Diagnosis not present

## 2019-12-04 DIAGNOSIS — F329 Major depressive disorder, single episode, unspecified: Secondary | ICD-10-CM | POA: Diagnosis not present

## 2019-12-04 DIAGNOSIS — F325 Major depressive disorder, single episode, in full remission: Secondary | ICD-10-CM | POA: Diagnosis not present

## 2019-12-04 DIAGNOSIS — E785 Hyperlipidemia, unspecified: Secondary | ICD-10-CM | POA: Diagnosis not present

## 2019-12-30 ENCOUNTER — Other Ambulatory Visit: Payer: Self-pay | Admitting: Internal Medicine

## 2019-12-30 DIAGNOSIS — J45991 Cough variant asthma: Secondary | ICD-10-CM

## 2020-01-02 DIAGNOSIS — H02051 Trichiasis without entropian right upper eyelid: Secondary | ICD-10-CM | POA: Diagnosis not present

## 2020-01-02 DIAGNOSIS — H35372 Puckering of macula, left eye: Secondary | ICD-10-CM | POA: Diagnosis not present

## 2020-01-02 DIAGNOSIS — H538 Other visual disturbances: Secondary | ICD-10-CM | POA: Diagnosis not present

## 2020-01-02 DIAGNOSIS — Z961 Presence of intraocular lens: Secondary | ICD-10-CM | POA: Diagnosis not present

## 2020-01-09 ENCOUNTER — Encounter: Payer: Self-pay | Admitting: Physician Assistant

## 2020-01-09 ENCOUNTER — Other Ambulatory Visit: Payer: Self-pay | Admitting: Internal Medicine

## 2020-01-09 ENCOUNTER — Ambulatory Visit (INDEPENDENT_AMBULATORY_CARE_PROVIDER_SITE_OTHER): Payer: Medicare Other | Admitting: Physician Assistant

## 2020-01-09 VITALS — Ht 66.0 in | Wt 184.0 lb

## 2020-01-09 DIAGNOSIS — I2510 Atherosclerotic heart disease of native coronary artery without angina pectoris: Secondary | ICD-10-CM

## 2020-01-09 DIAGNOSIS — Z89511 Acquired absence of right leg below knee: Secondary | ICD-10-CM | POA: Diagnosis not present

## 2020-01-09 DIAGNOSIS — I2584 Coronary atherosclerosis due to calcified coronary lesion: Secondary | ICD-10-CM

## 2020-01-09 NOTE — Progress Notes (Signed)
Office Visit Note   Patient: Shelly [name]           Date of Birth: Jul 20, 1950           MRN: [id number] Visit Date: 01/09/2020              Requested by: Carol Ada, [place],  [place] [address] PCP: Carol Ada, MD  Chief Complaint  Patient presents with  . Right Leg - Follow-up    Hx BKA 2013       HPI: This is a pleasant active 69 year old woman who is 8 years status post right below-knee amputation.  I saw her recently because she has having difficulty with her liners.  She thought this was why her prosthetic was ill fitting.  I did give her new liners but unfortunately this is not helped.  She has fallen 4 times because her prosthetic no longer fits her properly.  She does report that in 2018 she began a weight loss program and since has lost 40 pounds.  This has caused a volume loss in her amputation stump.  She has not had a new prosthetic since 2018  Assessment & Plan: Visit Diagnoses: No diagnosis found.  Plan: I have given her a prescription for a new prosthetic specifically a K3 prosthetic.  This has a vacuum suction.  This is necessary to prevent further falls and injuries.  She may follow-up with [place] as needed  Patient is an existing right transtibial  amputee.  Patient's current comorbidities are not expected to impact the ability to function with the prescribed prosthesis. Patient verbally communicates a strong desire to use a prosthesis. Patient currently requires mobility aids to ambulate without a prosthesis.  Expects not to use mobility aids with a new prosthesis.  Patient is a K3 level ambulator that spends a lot of time walking around on uneven terrain over obstacles, up and down stairs, and ambulates with a variable cadence.    Follow-Up Instructions: No follow-ups on file.   Ortho Exam  Patient is alert, oriented, no adenopathy, well-dressed, normal affect, normal respiratory effort.  Focused examination of her  right below-knee amputation stump currently there are no areas of irritation or skin breakdown.  She does have a very ill fitting liner and her prosthetic is unstable and loose.  Also foot shell has completely worn down.  Imaging: No results found. No images are attached to the encounter.  Labs: Lab Results  Component Value Date   ESRSEDRATE 2 03/20/2017   ESRSEDRATE 10 09/08/2010     Lab Results  Component Value Date   ALBUMIN 3.2 (L) 01/11/2019    Lab Results  Component Value Date   MG 1.7 02/12/2015   No results found for: VD25OH  No results found for: PREALBUMIN CBC EXTENDED Latest Ref Rng & Units 01/11/2019 01/02/2019 12/11/2017  WBC 4.0 - 10.5 K/uL 10.3 8.7 -  RBC 3.87 - 5.11 MIL/uL 3.47(L) 3.91 -  HGB 12.0 - 15.0 g/dL 10.4(L) 12.0 13.3  HCT 36 - 46 % 34.0(L) 37.8 39.0  PLT 150 - 400 K/uL 309 308 -  NEUTROABS 1.7 - 7.7 K/uL 7.0 - -  LYMPHSABS 0.7 - 4.0 K/uL 2.1 - -     Body mass index is 29.7 kg/m.  Orders:  No orders of the defined types were placed in this encounter.  No orders of the defined types were placed in this encounter.    Procedures: No procedures performed  Clinical Data: No additional findings.  ROS:  All other systems negative, except as noted in the HPI. Review of Systems  Objective: Vital Signs: Ht '5\' 6"'$  (1.676 m)   Wt 184 lb (83.5 kg)   LMP  (LMP Unknown)   BMI 29.70 kg/m   Specialty Comments:  No specialty comments available.  PMFS History: Patient Active Problem List   Diagnosis Date Noted  . Upper airway cough syndrome 03/26/2019  . Chronic pain 01/10/2019  . Memory difficulty 03/20/2017  . Gait abnormality 03/20/2017  . History of right below knee amputation ([place]) 07/11/2016  . Adenomatous colon polyp 05/10/2016  . Coronary artery calcification 01/07/2016  . Essential hypertension 01/07/2016  . Back pain 09/09/2015  . Abnormal CXR 05/16/2014  . Cough variant asthma ? assoc with UACS 05/15/2014  . Acute  post-operative pain 02/18/2012  . Adiposity 02/17/2012  . Does use eyeglasses 02/13/2012  . H/O arthrodesis 10/21/2011  . Fibromyalgia 04/11/2011  . HLD (hyperlipidemia) 04/11/2011  . Leg edema 09/08/2010   Past Medical History:  Diagnosis Date  . Anxiety   . Arthritis   . Asthma   . Avascular necrosis of talus ([place])   . Cancer ([place])    vulva pre cancer had surgery  . Depression   . Gait abnormality 03/20/2017  . GERD (gastroesophageal reflux disease)   . Hearing loss    "very minor"  . History of pneumonia   . Hyperlipidemia   . Hypertension   . Leg edema    left  . Memory difficulty 03/20/2017  . PONV (postoperative nausea and vomiting)   . Stress incontinence     Family History  Problem Relation Age of Onset  . Dementia Mother 54       alive  . Fibromyalgia Mother   . Allergies Mother   . Heart attack Father 72       deceased  . Multiple myeloma Father   . Other Brother        alive  . Heart disease Brother        emergent CABG for 99% blocked CAD  . Heart murmur Sister 2       she had open heart surgery  . Other Sister 69       She is bIpolar- diabetic  . Breast cancer Sister     Past Surgical History:  Procedure Laterality Date  . ANKLE FUSION  2011   right multiple   . ANKLE FUSION     rear ankle fusion  . ANKLE SURGERY  2010   right cordicompression  . AORTIC ARCH ANGIOGRAPHY N/A 12/11/2017   Procedure: AORTIC ARCH ANGIOGRAPHY;  Surgeon: Elam Dutch, MD;  Location: [facility];  Service: Cardiovascular;  Laterality: N/A;  . APPENDECTOMY  05/10/2016  . BELOW KNEE LEG AMPUTATION    . CATARACT EXTRACTION W/ INTRAOCULAR LENS IMPLANT Left   . LAPAROSCOPIC APPENDECTOMY N/A 05/10/2016   Procedure: APPENDECTOMY LAPAROSCOPIC;  Surgeon: Coralie Keens, MD;  Location: [facility];  Service: General;  Laterality: N/A;  . LUMBAR LAMINECTOMY/DECOMPRESSION MICRODISCECTOMY N/A 09/09/2015   Procedure:  L4-S1 Decompression/ Discetomy;  Surgeon: Melina Schools, MD;  Location: [facility];  Service: Orthopedics;  Laterality: N/A;  . SKIN GRAFT    . SPINAL CORD STIMULATOR INSERTION N/A 01/10/2019   Procedure: LUMBAR SPINAL CORD STIMULATOR INSERTION;  Surgeon: Melina Schools, MD;  Location: [facility];  Service: Orthopedics;  Laterality: N/A;  2.5 hrs  . TONSILLECTOMY    . TUBAL LIGATION  [place] N/A 01/28/2015   Procedure: WIDE EXCISION VULVECTOMY;  Surgeon: Thurnell Lose, MD;  Location: [facility];  Service: Gynecology;  Laterality: N/A;   Social History   Occupational History  . Occupation: ACCOUNTING    Employer: [employer]  Tobacco Use  . Smoking status: Former Smoker    Packs/day: 1.00    Years: 40.00    Pack years: 40.00    Types: Cigarettes    Quit date: 02/05/2007    Years since quitting: 12.9  . Smokeless tobacco: Never Used  Vaping Use  . Vaping Use: Never used  Substance and Sexual Activity  . Alcohol use: Yes    Alcohol/week: 0.0 standard drinks    Comment: socially  . Drug use: No  . Sexual activity: Not on file

## 2020-01-13 NOTE — Telephone Encounter (Signed)
Refill request for Tramadol 50mg   Last OV: 11/14/2019 Next OV: No appointment scheduled yet  Last ordered by : Unsure Quantity: 4 Instructions:Take 1 Tablet(50 MG) by mouth every 4 hours for up to 5 days as needed for cough or pain (this was written on prescription from pharmacy) but down below on the med list it says take 50mg  three times a day as needed  Dr Melvyn Novas please advise.   No Known Allergies  Current Outpatient Medications on File Prior to Visit  Medication Sig Dispense Refill  . acidophilus (RISAQUAD) CAPS capsule Take 1 capsule by mouth daily.    Marland Kitchen aspirin EC 81 MG tablet Take 1 tablet (81 mg total) by mouth daily. 90 tablet 3  . benzonatate (TESSALON) 200 MG capsule Take 1 capsule (200 mg total) by mouth 3 (three) times daily as needed for cough. 90 capsule 11  . Biotin 5000 MCG TABS Take 5,000 mcg by mouth every evening.    . budesonide-formoterol (SYMBICORT) 80-4.5 MCG/ACT inhaler Inhale 2 puffs into the lungs 2 (two) times daily. 1 Inhaler 6  . buPROPion (WELLBUTRIN XL) 300 MG 24 hr tablet Take 300 mg by mouth daily.      Marland Kitchen CALCIUM CITRATE PO Take 400 mg by mouth 2 (two) times a day.    . chlorpheniramine (CHLOR-TRIMETON) 4 MG tablet Take 8 mg by mouth 2 (two) times a day.     . Cholecalciferol (VITAMIN D PO) Take 5,000 Units by mouth daily.    . Cyanocobalamin (B-12 PO) Take 5,000 mcg by mouth daily.     Marland Kitchen denosumab (PROLIA) 60 MG/ML SOLN injection Inject 60 mg into the skin every 6 (six) months.     . desonide (DESOWEN) 0.05 % cream APPLY TO AFFECTED AREA TWICE A DAY    . esomeprazole (NEXIUM) 20 MG capsule Take by mouth.    Marland Kitchen FLUoxetine (PROZAC) 20 MG capsule Take 20 mg by mouth daily.    . Folic Acid-Cholecalciferol 06-4998 MG-UNIT TABS Take by mouth.    . losartan (COZAAR) 50 MG tablet Take 50 mg by mouth daily.     . metaxalone (SKELAXIN) 800 MG tablet Take by mouth.    . methocarbamol (ROBAXIN) 500 MG tablet Take 500 mg by mouth every 8 (eight) hours as needed.    .  montelukast (SINGULAIR) 10 MG tablet Take 1 tablet (10 mg total) by mouth at bedtime. 30 tablet 11  . Multiple Vitamins-Minerals (CENTRUM SILVER PO) Take 1 tablet by mouth daily.      . NYSTATIN powder Apply 1 application topically 2 (two) times daily as needed.    . pantoprazole (PROTONIX) 40 MG tablet TAKE 1 TABLET(40 MG) BY MOUTH DAILY 30 TO 60 MINUTES BEFORE FIRST MEAL OF THE DAY 30 tablet 3  . predniSONE (DELTASONE) 10 MG tablet Take  4 each am x 2 days,   2 each am x 2 days,  1 each am x 2 days and stop 14 tablet 0  . rosuvastatin (CRESTOR) 10 MG tablet Take 10 mg by mouth every evening.   1  . SYMBICORT 80-4.5 MCG/ACT inhaler INHALE 2 PUFFS INTO THE LUNGS 2 (TWO) TIMES DAILY. 10.2 g 5  . traMADol (ULTRAM) 50 MG tablet Take 50 mg by mouth 3 (three) times daily as needed.    . valACYclovir (VALTREX) 1000 MG tablet Take 1,000 mg by mouth daily as needed.      No current facility-administered medications on file prior to visit.

## 2020-01-13 NOTE — Telephone Encounter (Signed)
Can't do s at least a televisit with me any day this week as an add on or NP ok

## 2020-01-14 ENCOUNTER — Other Ambulatory Visit: Payer: Self-pay | Admitting: Internal Medicine

## 2020-01-14 NOTE — Telephone Encounter (Signed)
Be sure to offer televisit

## 2020-01-16 DIAGNOSIS — L565 Disseminated superficial actinic porokeratosis (DSAP): Secondary | ICD-10-CM | POA: Diagnosis not present

## 2020-01-16 DIAGNOSIS — D692 Other nonthrombocytopenic purpura: Secondary | ICD-10-CM | POA: Diagnosis not present

## 2020-01-16 DIAGNOSIS — L821 Other seborrheic keratosis: Secondary | ICD-10-CM | POA: Diagnosis not present

## 2020-01-16 DIAGNOSIS — D1801 Hemangioma of skin and subcutaneous tissue: Secondary | ICD-10-CM | POA: Diagnosis not present

## 2020-01-16 DIAGNOSIS — D225 Melanocytic nevi of trunk: Secondary | ICD-10-CM | POA: Diagnosis not present

## 2020-01-16 DIAGNOSIS — L814 Other melanin hyperpigmentation: Secondary | ICD-10-CM | POA: Diagnosis not present

## 2020-01-16 DIAGNOSIS — L570 Actinic keratosis: Secondary | ICD-10-CM | POA: Diagnosis not present

## 2020-01-20 ENCOUNTER — Other Ambulatory Visit: Payer: Self-pay

## 2020-01-20 ENCOUNTER — Encounter: Payer: Self-pay | Admitting: Internal Medicine

## 2020-01-20 ENCOUNTER — Ambulatory Visit (INDEPENDENT_AMBULATORY_CARE_PROVIDER_SITE_OTHER): Payer: Medicare Other | Admitting: Internal Medicine

## 2020-01-20 DIAGNOSIS — J45991 Cough variant asthma: Secondary | ICD-10-CM | POA: Diagnosis not present

## 2020-01-20 MED ORDER — TRAMADOL HCL 50 MG PO TABS: 50.0000 mg | ORAL_TABLET | Freq: Three times a day (TID) | ORAL | 0 refills | Status: DC | PRN

## 2020-01-20 NOTE — Progress Notes (Addendum)
Subjective:    Patient ID: Shelly [name], female    DOB: July 06, 1950       MRN: [id number]    Brief patient profile:  32  yowf mother  of optimetrist  quit smoking 2008  watery eyes in her 55s year round ? Worse in winter eventually started on zyrtec 2013 and seemed to help s assoc cough/ wheeze then new onset cough Aug 2015 and proved refractory [name] referred to pulmonary clinic 05/15/14  by Dr Jeremy Johann    History of Present Illness  05/15/2014 1st [facility] office visit/ Shelly [name]   Chief Complaint  Patient presents with  . Pulmonary Consult    Referred by Dr. Carol Ada. Pt c/o cough since August 2015- cough is non prod and somtimes seems worse at night. She also c/o DOE with walking up stairs.    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   Prednisone 10 mg take  4 each am x 2 days,   2 each am x 2 days,  1 each am x 2 days and stop (this is to eliminate allergies and inflammation from coughing) Omeprazole 40 mg  Take 30-60 min before first meal of the day and Pepcid 20 mg one bedtime plus chlorpheniramine 4 mg x 2 at bedtime  GERD diet reviewed     05/28/2014 f/u ov/Shelly [name] re: cough since aug 2015 Chief Complaint  Patient presents with  . Acute Visit    Pt states that after last visit her cough had pretty much resolved, but then started back about 1 wk ago.  Cough is non prod and "comes and goes".   95% better, still urge to  clear throat daytime on above rx/ confused about when to d/c what  No noct cough  at all  Sob doe x steps better Then gradually worse x one week , again dry and variable   Kouffman Reflux v Neurogenic Cough Differentiator Reflux Comments  Do you awaken from a sound sleep coughing violently?                            With trouble breathing? Not now   Do you have choking episodes when you cannot  Get enough air, gasping for air ?              Not now   Do you usually cough when you lie down into  The bed, or when you just lie down to rest ?                          Not now    Do you usually cough after meals or eating?         No   Do you cough when (or after) you bend over?    Still some  GERD SCORE     Kouffman Reflux v Neurogenic Cough Differentiator Neurogenic   Do you more-or-less cough all day long? no   Does change of temperature make you cough? no   Does laughing or chuckling cause you to cough? Still some   Do fumes (perfume, automobile fumes, burned  Toast, etc.,) cause you to cough ?      no   Does speaking, singing, or talking on the phone cause you to cough   ?               Not now   Neurogenic/Airway score       rec Prednisone 10 mg take  4 each am x 2 days,   2 each am x 2 days,  1 each am x 2 days and stop  Take Tessalon 4x daily  and supplement if needed with  tramadol 50 mg up to 2 every 4 hours to suppress the urge to cough. Swallowing water or using ice chips/non mint and menthol containing candies (such as lifesavers or sugarless jolly ranchers) are also effective.  You should rest your voice and avoid activities that you know make you cough. Once you have eliminated the cough for 3 straight days try reducing the tramadol first,  then the tessalon as tolerated Please see patient coordinator before you leave today  to schedule sinus and chest CT and fluoro > neg       10/18/2018   Pulmonary/ re-estabish/Shelly [name] re:  Tendency to cough sporadic pattern x years,  recurrent since March 2020 while on singulair  Chief Complaint  Patient presents with  . Pulmonary Consult    Self referral.  Pt states has had increased cough x 2 months.  Cough is currently non prod. She has not had to use her proair in the past wk.     was maint on singulair,  h1 x 2 hs and no gerd rx then acutely worse x 2 months s obvious trigger Typically year round pattern is that every few weeks the cough starts back up  Dyspnea:  MMRC1 = even on best edays  can walk nl pace, flat grade, can't hurry or go uphills or steps s sob   Cough: mostly dry / gag ? Sometime vomit when cough is severe and "almost pass out"  Sleeping: bed is flat / cough was bad at night better now  SABA use: saba helped as does prednisone  02: 02  Using lots of mints  acei intol ? When  d/c'd (started summer 2019)  rec Plan A = Automatic =Dulera 100 Take 2 puffs first thing in am and then another 2 puffs about 12 hours later and continue singulair 10 mg each pm  Work on inhaler technique:   Plan B = Backup Only use your albuterol inhaler as a rescue medication  Whenever cough start  prilosec 20 mg Take 30- 60 min before your first and last meals of the day until no longer coughing and  For drainage / throat tickle try take CHLORPHENIRAMINE  4 mg  (Chlortab 4mg   at [employer] should be easiest to find in the green box)  take one every 4 hours as needed - available over the counter- may cause drowsiness [name] start with just a bedtime dose or two and see how you tolerate it before trying in daytime      12/12/2018  f/u ov/Shelly [name] re:  Cough resolved on dulera 100 > symb 80 /4.5 plus singulair plus prn  h1   Chief Complaint  Patient presents with  . Follow-up    Breathing is doing well and no new co's today. She has not been using her albuterol inhaler.   Dyspnea:  MMRC1 = can walk nl pace, flat grade, can't hurry or go uphills or steps s sob   Cough: none Sleeping: no resp symptoms  SABA use: none 02: none   rec Please remember to go to the  x-ray department  for your tests - we will call you with the results when they are available . No change in medication - ok to stop singulair after surgery > never stopped  Remember for any flare of cough > add back nexium 20mg  Take 30- 60 min before your first and last meals of the day x until better for at least a week    03/25/2019  f/u ov/Shelly [name] re: cough variant asthma vs uacs on symb 80 2bid /singulair chlorphiramine flared p ET for back surgery  > assoc with ST  Chief Complaint  Patient presents with  . Follow-up    Increased cough x 10 days- non prod. She also has noticed minimal wheezing x 2 days.    Dyspnea:  Not limited by breathing from desired activities   Cough: dry, worse around 5 pm but does not wake her up noct, worse with laughter Sleeping: on side /bed flat better p cough drop hs and no noct cough  SABA use: none  02: none  rec Protonix Take 30-60 min before first meal of the day and nexium 20 mg Take 30-60 min before last meal of the day  Continue  Chlorpheniramine up to 2 x 4 mg every 4 hours to eliminate sense of pnds  GERD  To suppress urge to cough/ clear throat  > tessalon 200 mg up to 4 x daily rec  Protonix Take 30-60 min before first meal of the day and nexium 20 mg Take 30-60 min before last meal of the day  Continue  Chlorpheniramine up to 2 x 4 mg every 4 hours to eliminate sense of pnds  GERD. To suppress urge to cough/ clear throat  > tessalon 200 mg up to 4 x daily Follow up in 1-2 weeks if not improving      11/14/2019  f/u ov/Shelly [name] re: uacs vs cough variant asthma flared while on pantoprarzole / h1 bid and singulair and no symb or pm nexium  But added these back p onset of cough 2 weeks prior to OV  And no better  Chief Complaint  Patient presents with  . Follow-up    coughing more 2 weeks  acute routine : start back on nexium/ symbicrt 80 2bid/ tesssilon  Dyspnea:  Only if coughing  Cough: very harsh to point  of presyncope /  Sleeping on side/ bed is flat/ and one head pillow SABA use: none  02: none  No tramadol x 2 weeks  rec Take delsym two tsp every 12 hours and supplement if needed with  tramadol 50 mg up to 1-2 every 4 hours  Once you have eliminated the cough for 3 straight days Hold prozac if taking high doses of tramadol  GERD diet . If not better >>  Prednisone 10 mg take  4 each am x 2 days,   2 each am x 2 days,  1 each am x 2 days and stop    Virtual Visit via Telephone Note 01/20/2020   I connected with Shelly [name] on  01/20/20 at  8:15  AM EDT by telephone and verified that I am speaking with the correct person using two identifiers. Pt is at home and this call made from my office with no other participants.     I discussed the limitations, risks, security and privacy concerns of performing an evaluation and management service by telephone and the availability of in person appointments. I also discussed with the patient that there may be a patient responsible charge related to this service. The patient expressed understanding and agreed to proceed.   History of Present Illness: cough since in 2015 never completely better/ has been vaccinated for covid 19   Dyspnea:  fine Cough: mostly just throat clearing worse in church, hard rock candy helps Sleeping: fine bed flat, one pillow/ throat clearing not  SABA use: none  02: none   Maint singulair/ pantoprazole ac   No obvious patterns  day to day or daytime variability or assoc excess/ purulent sputum or mucus plugs or hemoptysis or cp or chest tightness, subjective wheeze or overt sinus or hb symptoms.    Also denies any obvious fluctuation of symptoms with weather or environmental changes or other aggravating or alleviating factors except as outlined above.   Meds reviewed/ med reconciliation completed         Observations/Objective: No cough or throat clearing or conversational sob/ good voice texture     Assessment and Plan: See problem list for active a/p's   Follow Up Instructions: See avs for instructions unique to this ov which includes revised/ updated med list     I discussed the assessment and treatment plan with the patient. The patient was provided an opportunity to ask questions and all were answered. The patient agreed with the plan and demonstrated an understanding of the instructions.   The patient was advised to call back or seek an in-person evaluation if the symptoms worsen or if the condition fails to improve as anticipated.  I provided 25 minutes of non-face-to-face time during this encounter.   Christinia Gully, MD

## 2020-01-20 NOTE — Assessment & Plan Note (Signed)
Onset Aug 2015 sporadic, severe, not seasonal/ and throat clearing ever since  - allergy profile 05/28/2014 > No eos, IgE 424 POS RAST only dust and mold  -  Sinus/chest  CT 06/03/2014 > wnl  - Trial of singulair 06/30/2014 >>>  ? Benefit  - 2019 proved intolerant of ACEi  - 10/18/2018  After extensive coaching inhaler device,  effectiveness =  75%> try dulera 100 2bid and 1st gen H1 blockers per guidelines    no mints > 100% resolution s gerd rx needed  - 05/10/2019 recurrence on symb 80/off ppi p [place] food > restart with just tessalon prn  - recurred late may 2020 while maint on  pantoprarzole / h1 bid and singulair - start back on symb 80 2bid as maint x one month and if no better consider gabapentin   Classic UACS favored over cough variant asthma but since not taking symb 80  As maint yet and only having throat clearing (which never clears) with sense the cough is starting back up reasonable to see if symb 80 2bid helps or not for this episode using the tramadol short term only if needed.  Each maintenance medication was reviewed in detail including most importantly the difference between maintenance and as needed and under what circumstances the prns are to be used.  Please see AVS for specific  Instructions which are unique to this visit and I personally typed out  which were reviewed in detail over the phone with the patient and a copy provided via my chart   >>> f/u one month

## 2020-01-20 NOTE — Patient Instructions (Addendum)
Add symbicort 80 Take 2 puffs first thing in am and then another 2 puffs about 12 hours later x one month  Continue pantoprazole 40 mg Take 30- 60 min before your first  meals of the day  And if cough flares Take 30- 60 min before your first and last meals of the day   chlortabs can be taken up to every 4 hours as needed   If cough flares >>> Take delsym two tsp every 12 hours and supplement if needed with  tramadol 50 mg up to 2 every 4 hours to suppress the urge to cough. Swallowing water and/or using ice chips/non mint and menthol containing candies (such as lifesavers or sugarless jolly ranchers) are also effective.  You should rest your voice and avoid activities that you know make you cough.  Once you have eliminated the cough for 3 straight days try reducing the tramadol first,  then the delsym as tolerated.    Hold prozac when doing high dose tramadol  We will set you up for another televist in a month to decide re gabapentin.

## 2020-02-05 ENCOUNTER — Other Ambulatory Visit: Payer: Self-pay | Admitting: Internal Medicine

## 2020-02-05 MED ORDER — BUDESONIDE-FORMOTEROL FUMARATE 80-4.5 MCG/ACT IN AERO: 2.0000 | INHALATION_SPRAY | Freq: Two times a day (BID) | RESPIRATORY_TRACT | 11 refills | Status: DC

## 2020-02-14 ENCOUNTER — Other Ambulatory Visit: Payer: Self-pay | Admitting: Orthopedic Surgery

## 2020-02-14 DIAGNOSIS — M542 Cervicalgia: Secondary | ICD-10-CM

## 2020-02-14 DIAGNOSIS — M5412 Radiculopathy, cervical region: Secondary | ICD-10-CM | POA: Diagnosis not present

## 2020-02-26 ENCOUNTER — Ambulatory Visit
Admission: RE | Admit: 2020-02-26 | Discharge: 2020-02-26 | Disposition: A | Discharge status: Home / Self Care | Payer: Medicare Other | Source: Ambulatory Visit | Attending: Orthopedic Surgery | Admitting: Orthopedic Surgery

## 2020-02-26 DIAGNOSIS — M47812 Spondylosis without myelopathy or radiculopathy, cervical region: Secondary | ICD-10-CM | POA: Diagnosis not present

## 2020-02-26 DIAGNOSIS — M542 Cervicalgia: Secondary | ICD-10-CM

## 2020-02-26 DIAGNOSIS — M4802 Spinal stenosis, cervical region: Secondary | ICD-10-CM | POA: Diagnosis not present

## 2020-03-03 DIAGNOSIS — M79604 Pain in right leg: Secondary | ICD-10-CM | POA: Diagnosis not present

## 2020-03-03 DIAGNOSIS — E559 Vitamin D deficiency, unspecified: Secondary | ICD-10-CM | POA: Diagnosis not present

## 2020-03-03 DIAGNOSIS — E538 Deficiency of other specified B group vitamins: Secondary | ICD-10-CM | POA: Diagnosis not present

## 2020-03-03 DIAGNOSIS — Z23 Encounter for immunization: Secondary | ICD-10-CM | POA: Diagnosis not present

## 2020-03-03 DIAGNOSIS — R413 Other amnesia: Secondary | ICD-10-CM | POA: Diagnosis not present

## 2020-03-05 ENCOUNTER — Ambulatory Visit (INDEPENDENT_AMBULATORY_CARE_PROVIDER_SITE_OTHER): Payer: Medicare Other | Admitting: Internal Medicine

## 2020-03-05 ENCOUNTER — Other Ambulatory Visit: Payer: Self-pay

## 2020-03-05 ENCOUNTER — Other Ambulatory Visit: Payer: Self-pay | Admitting: Internal Medicine

## 2020-03-05 ENCOUNTER — Encounter: Payer: Self-pay | Admitting: Internal Medicine

## 2020-03-05 DIAGNOSIS — E785 Hyperlipidemia, unspecified: Secondary | ICD-10-CM | POA: Diagnosis not present

## 2020-03-05 DIAGNOSIS — F325 Major depressive disorder, single episode, in full remission: Secondary | ICD-10-CM | POA: Diagnosis not present

## 2020-03-05 DIAGNOSIS — I10 Essential (primary) hypertension: Secondary | ICD-10-CM | POA: Diagnosis not present

## 2020-03-05 DIAGNOSIS — F329 Major depressive disorder, single episode, unspecified: Secondary | ICD-10-CM | POA: Diagnosis not present

## 2020-03-05 DIAGNOSIS — G459 Transient cerebral ischemic attack, unspecified: Secondary | ICD-10-CM | POA: Diagnosis not present

## 2020-03-05 DIAGNOSIS — M810 Age-related osteoporosis without current pathological fracture: Secondary | ICD-10-CM | POA: Diagnosis not present

## 2020-03-05 DIAGNOSIS — J45991 Cough variant asthma: Secondary | ICD-10-CM

## 2020-03-05 MED ORDER — BUDESONIDE-FORMOTEROL FUMARATE 80-4.5 MCG/ACT IN AERO: INHALATION_SPRAY | RESPIRATORY_TRACT | 5 refills | Status: DC

## 2020-03-05 MED ORDER — BUDESONIDE-FORMOTEROL FUMARATE 80-4.5 MCG/ACT IN AERO: 2.0000 | INHALATION_SPRAY | Freq: Two times a day (BID) | RESPIRATORY_TRACT | 11 refills | Status: DC

## 2020-03-05 NOTE — Patient Instructions (Signed)
Continue protonix 40 mg Take 30- 60 min before your first and last meals of the day   Next time cough flares immediately start symbicort 80 Take 2 puffs first thing in am and then another 2 puffs about 12 hours later and only use the tramadol if cough totally out of control or persists after a full week of the symbicort to distinguish the benefit of symbicort or lack thereof  Please schedule a follow up visit in 3 months but call sooner if needed  with all medications /inhalers/ solutions in hand so we can verify exactly what you are taking. This includes all medications from all doctors and over the counters

## 2020-03-05 NOTE — Assessment & Plan Note (Signed)
Onset Aug 2015 sporadic, severe, not seasonal/ and throat clearing ever since  - allergy profile 05/28/2014 > No eos, IgE 424 POS RAST only dust and mold  -  Sinus/chest  CT 06/03/2014 > wnl  - Trial of singulair 06/30/2014 >>>  ? Benefit  - 2019 proved intolerant of ACEi  - 10/18/2018  After extensive coaching inhaler device,  effectiveness =  75%> try dulera 100 2bid and 1st gen H1 blockers per guidelines    no mints > 100% resolution s gerd rx needed  - 05/10/2019 recurrence on symb 80/off ppi p [place] [employer] > restart with just tessalon prn  - recurred late may 2020 while maint on  pantoprarzole / h1 bid and singulair - 01/20/20 started back on symb 80 2bid as maint x one month  > only took a week and "all better"  - 03/05/2020 reported cough /throat clearing stopped after one week of symbicort (but also on tramadol), continued ppi bid and remained cough free so rec if cough recurs take symb 80 2bid s tramadol and continue ppi bid/ diet x 3 months then ov   Resolution on symbicort 80 plus tramadol hard to interpret as stopped both after  A week so next time cough flares rec do the low dose symb s tramadol if possible to determine cause and effect.  Each maintenance medication was reviewed in detail including most importantly the difference between maintenance and as needed and under what circumstances the prns are to be used.  Please see AVS for specific  Instructions which are unique to this visit and I personally typed out  which were reviewed in detail over the phone with the patient and a copy provided via MyChart

## 2020-03-05 NOTE — Progress Notes (Signed)
Subjective:    Patient ID: Shelly [name], female    DOB: 1950-11-04       MRN: [id number]    Brief patient profile:  35  yowf mother  of optimet[employer]  quit smoking 2008  watery eyes in her 98s year round ? Worse in winter eventually started on zyrtec 2013 and seemed to help s assoc cough/ wheeze then new onset cough Aug 2015 and proved refractory [name] referred to pulmonary clinic 05/15/14  by Dr Jeremy Johann    History of Present Illness  05/15/2014 1st [facility] office visit/ Shelly [name]   Chief Complaint  Patient presents with  . Pulmonary Consult    Referred by Dr. Carol Ada. Pt c/o cough since August 2015- cough is non prod and somtimes seems worse at night. She also c/o DOE with walking up stairs.    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   Prednisone 10 mg take  4 each am x 2 days,   2 each am x 2 days,  1 each am x 2 days and stop (this is to eliminate allergies and inflammation from coughing) Omeprazole 40 mg  Take 30-60 min before first meal of the day and Pepcid 20 mg one bedtime plus chlorpheniramine 4 mg x 2 at bedtime  GERD diet reviewed     05/28/2014 f/u ov/Shelly [name] re: cough since aug 2015 Chief Complaint  Patient presents with  . Acute Visit    Pt states that after last visit her cough had pretty much resolved, but then started back about 1 wk ago.  Cough is non prod and "comes and goes".   95% better, still urge to  clear throat daytime on above rx/ confused about when to d/c what  No noct cough  at all  Sob doe x steps better Then gradually worse x one week , again dry and variable   Kouffman Reflux v Neurogenic Cough Differentiator Reflux Comments  Do you awaken from a sound sleep coughing violently?                            With trouble breathing? Not now   Do you have choking episodes when you cannot  Get enough air, gasping for air ?              Not now   Do you usually cough when you lie down into  The bed, or when you just lie down to rest ?                          Not now    Do you usually cough after meals or eating?         No   Do you cough when (or after) you bend over?    Still some  GERD SCORE     Kouffman Reflux v Neurogenic Cough Differentiator Neurogenic   Do you more-or-less cough all day long? no   Does change of temperature make you cough? no   Does laughing or chuckling cause you to cough? Still some   Do fumes (perfume, automobile fumes, burned  Toast, etc.,) cause you to cough ?      no   Does speaking, singing, or talking on the phone cause you to cough   ?               Not now   Neurogenic/Airway score       rec Prednisone 10 mg take  4 each am x 2 days,   2 each am x 2 days,  1 each am x 2 days and stop  Take Tessalon 4x daily  and supplement if needed with  tramadol 50 mg up to 2 every 4 hours to suppress the urge to cough. Swallowing water or using ice chips/non mint and menthol containing candies (such as lifesavers or sugarless jolly ranchers) are also effective.  You should rest your voice and avoid activities that you know make you cough. Once you have eliminated the cough for 3 straight days try reducing the tramadol first,  then the tessalon as tolerated Please see patient coordinator before you leave today  to schedule sinus and chest CT and fluoro > neg       10/18/2018   Pulmonary/ re-estabish/Shelly [name] re:  Tendency to cough sporadic pattern x years,  recurrent since March 2020 while on singulair  Chief Complaint  Patient presents with  . Pulmonary Consult    Self referral.  Pt states has had increased cough x 2 months.  Cough is currently non prod. She has not had to use her proair in the past wk.     was maint on singulair,  h1 x 2 hs and no gerd rx then acutely worse x 2 months s obvious trigger Typically year round pattern is that every few weeks the cough starts back up  Dyspnea:  MMRC1 = even on best edays  can walk nl pace, flat grade, can't hurry or go uphills or steps s sob   Cough: mostly dry / gag ? Sometime vomit when cough is severe and "almost pass out"  Sleeping: bed is flat / cough was bad at night better now  SABA use: saba helped as does prednisone  02: 02  Using lots of mints  acei intol ? When  d/c'd (started summer 2019)  rec Plan A = Automatic =Dulera 100 Take 2 puffs first thing in am and then another 2 puffs about 12 hours later and continue singulair 10 mg each pm  Work on inhaler technique:   Plan B = Backup Only use your albuterol inhaler as a rescue medication  Whenever cough start  prilosec 20 mg Take 30- 60 min before your first and last meals of the day until no longer coughing and  For drainage / throat tickle try take CHLORPHENIRAMINE  4 mg  (Chlortab 4mg   at [employer] should be easiest to find in the green box)  take one every 4 hours as needed - available over the counter- may cause drowsiness [name] start with just a bedtime dose or two and see how you tolerate it before trying in daytime      12/12/2018  f/u ov/Shelly [name] re:  Cough resolved on dulera 100 > symb 80 /4.5 plus singulair plus prn  h1   Chief Complaint  Patient presents with  . Follow-up    Breathing is doing well and no new co's today. She has not been using her albuterol inhaler.   Dyspnea:  MMRC1 = can walk nl pace, flat grade, can't hurry or go uphills or steps s sob   Cough: none Sleeping: no resp symptoms  SABA use: none 02: none   rec Please remember to go to the  x-ray department  for your tests - we will call you with the results when they are available . No change in medication - ok to stop singulair after surgery > never stopped  Remember for any flare of cough > add back nexium 20mg  Take 30- 60 min before your first and last meals of the day x until better for at least a week    03/25/2019  f/u ov/Shelly [name] re: cough variant asthma vs uacs on symb 80 2bid /singulair chlorphiramine flared p ET for back surgery  > assoc with ST  Chief Complaint  Patient presents with  . Follow-up    Increased cough x 10 days- non prod. She also has noticed minimal wheezing x 2 days.    Dyspnea:  Not limited by breathing from desired activities   Cough: dry, worse around 5 pm but does not wake her up noct, worse with laughter Sleeping: on side /bed flat better p cough drop hs and no noct cough  SABA use: none  02: none  rec Protonix Take 30-60 min before first meal of the day and nexium 20 mg Take 30-60 min before last meal of the day  Continue  Chlorpheniramine up to 2 x 4 mg every 4 hours to eliminate sense of pnds  GERD  To suppress urge to cough/ clear throat  > tessalon 200 mg up to 4 x daily rec  Protonix Take 30-60 min before first meal of the day and nexium 20 mg Take 30-60 min before last meal of the day  Continue  Chlorpheniramine up to 2 x 4 mg every 4 hours to eliminate sense of pnds  GERD. To suppress urge to cough/ clear throat  > tessalon 200 mg up to 4 x daily Follow up in 1-2 weeks if not improving      11/14/2019  f/u ov/Shelly [name] re: uacs vs cough variant asthma flared while on pantoprarzole / h1 bid and singulair and no symb or pm nexium  But added these back p onset of cough 2 weeks prior to OV  And no better  Chief Complaint  Patient presents with  . Follow-up    coughing more 2 weeks  acute routine : start back on nexium/ symbicrt 80 2bid/ tesssilon  Dyspnea:  Only if coughing  Cough: very harsh to point  of presyncope /  Sleeping on side/ bed is flat/ and one head pillow SABA use: none  02: none  No tramadol x 2 weeks  rec Take delsym two tsp every 12 hours and supplement if needed with  tramadol 50 mg up to 1-2 every 4 hours  Once you have eliminated the cough for 3 straight days Hold prozac if taking high doses of tramadol  GERD diet . If not better >>  Prednisone 10 mg take  4 each am x 2 days,   2 each am x 2 days,  1 each am x 2 days and stop    Virtual Visit via Telephone Note 01/20/2020   I connected with Shelly [name] on  01/20/20 at  8:15  AM EDT by telephone and verified that I am speaking with the correct person using two identifiers. Pt is at home and this call made from my office with no other participants.     I discussed the limitations, risks, security and privacy concerns of performing an evaluation and management service by telephone and the availability of in person appointments. I also discussed with the patient that there may be a patient responsible charge related to this service. The patient expressed understanding and agreed to proceed.   History of Present Illness: cough since in 2015 never completely better/ has been vaccinated for covid 19   Dyspnea:  fine Cough: mostly just throat clearing worse in church, hard rock candy helps Sleeping: fine bed flat, one pillow/ throat clearing not  SABA use: none  02: none  rec Add symbicort 80 Take 2 puffs first thing in am and then another 2 puffs about 12 hours later x one month Continue pantoprazole 40 mg Take 30- 60 min before your first  meals of the day  And if cough flares Take 30- 60 min before your first and last meals of the day  chlortabs can be taken up to every 4 hours as needed  If cough flares >>> Take delsym two tsp every 12 hours and supplement if needed with  tramadol 50 mg up to 2 every 4 hours to suppress the urge to cough Once you have eliminated the cough for 3 straight days try reducing the  tramadol first,  then the delsym as tolerated.   Hold prozac when doing high dose tramadol We will set you up for another televist in a month to decide re gabapentin.   Virtual Visit via Telephone Note 03/05/2020   I connected with Shelly [name] on 03/05/20 at 8:30 AM EDT by telephone and verified that I am speaking with the correct person using two identifiers. Pt is at home and this call made from my office with no other participants    I discussed the limitations, risks, security and privacy concerns of performing an evaluation and management service by telephone and the availability of in person appointments. I also discussed with the patient that there may be a patient responsible charge related to this service. The patient expressed understanding and agreed to proceed.   History of Present Illness: cough x 2015 finally better to her satisfaction Dyspnea:  Not limited by breathing from desired activities   Cough: much better / keeps candy handy seems to helps Sleeping: able to flat / on side / one pillow /  SABA use: none 02: none    No obvious day to day or daytime variability or assoc excess/ purulent sputum or mucus plugs or hemoptysis or cp or chest tightness, subjective wheeze or overt sinus or hb symptoms.    Also denies any obvious fluctuation of symptoms with weather or environmental changes or other aggravating or alleviating factors except as outlined above.   Meds reviewed/ med reconciliation completed     No outpatient medications have been marked as taking for the 03/05/20 encounter (Office Visit) with Tanda Rockers, MD.         Observations/Objective: Speaking in full sentences / good phonation, no throat clearing    Assessment and Plan: See problem list for active a/p's   Follow Up Instructions: See avs for instructions unique to this ov which includes revised/ updated med list     I discussed the assessment and treatment  plan with the patient. The  patient was provided an opportunity to ask questions and all were answered. The patient agreed with the plan and demonstrated an understanding of the instructions.   The patient was advised to call back or seek an in-person evaluation if the symptoms worsen or if the condition fails to improve as anticipated.  I provided  17 minutes of non-face-to-face time during this encounter.   Christinia Gully, MD

## 2020-03-08 DIAGNOSIS — Z23 Encounter for immunization: Secondary | ICD-10-CM | POA: Diagnosis not present

## 2020-03-09 DIAGNOSIS — J45991 Cough variant asthma: Secondary | ICD-10-CM

## 2020-03-09 MED ORDER — PANTOPRAZOLE SODIUM 40 MG PO TBEC: DELAYED_RELEASE_TABLET | ORAL | 1 refills | Status: DC

## 2020-03-12 DIAGNOSIS — M5412 Radiculopathy, cervical region: Secondary | ICD-10-CM | POA: Diagnosis not present

## 2020-03-19 DIAGNOSIS — M810 Age-related osteoporosis without current pathological fracture: Secondary | ICD-10-CM | POA: Diagnosis not present

## 2020-03-19 DIAGNOSIS — F325 Major depressive disorder, single episode, in full remission: Secondary | ICD-10-CM | POA: Diagnosis not present

## 2020-03-19 DIAGNOSIS — E785 Hyperlipidemia, unspecified: Secondary | ICD-10-CM | POA: Diagnosis not present

## 2020-03-19 DIAGNOSIS — G459 Transient cerebral ischemic attack, unspecified: Secondary | ICD-10-CM | POA: Diagnosis not present

## 2020-03-19 DIAGNOSIS — I10 Essential (primary) hypertension: Secondary | ICD-10-CM | POA: Diagnosis not present

## 2020-03-24 DIAGNOSIS — S5002XA Contusion of left elbow, initial encounter: Secondary | ICD-10-CM | POA: Diagnosis not present

## 2020-03-24 DIAGNOSIS — M5412 Radiculopathy, cervical region: Secondary | ICD-10-CM | POA: Diagnosis not present

## 2020-03-26 DIAGNOSIS — F325 Major depressive disorder, single episode, in full remission: Secondary | ICD-10-CM | POA: Diagnosis not present

## 2020-03-26 DIAGNOSIS — Z89511 Acquired absence of right leg below knee: Secondary | ICD-10-CM | POA: Diagnosis not present

## 2020-03-26 DIAGNOSIS — Z1389 Encounter for screening for other disorder: Secondary | ICD-10-CM | POA: Diagnosis not present

## 2020-03-26 DIAGNOSIS — E785 Hyperlipidemia, unspecified: Secondary | ICD-10-CM | POA: Diagnosis not present

## 2020-03-26 DIAGNOSIS — Z0000 Encounter for general adult medical examination without abnormal findings: Secondary | ICD-10-CM | POA: Diagnosis not present

## 2020-03-26 DIAGNOSIS — I10 Essential (primary) hypertension: Secondary | ICD-10-CM | POA: Diagnosis not present

## 2020-03-26 DIAGNOSIS — K219 Gastro-esophageal reflux disease without esophagitis: Secondary | ICD-10-CM | POA: Diagnosis not present

## 2020-03-29 IMAGING — CT CT ANGIOGRAPHY CHEST
1 of 6 series · 3 of 16 positions shown · IV contrast (omnipaque)
Comparison: CT of the chest June 03, 2014

CLINICAL DATA: Clinical suspicion for pulmonary embolus. Sore
throat and cough. Lower abdominal pain.

EXAM:
CT ANGIOGRAPHY CHEST
CT ABDOMEN AND PELVIS WITH CONTRAST
TECHNIQUE: Multidetector CT imaging of the chest was performed using the
standard protocol during bolus administration of intravenous
contrast. Multiplanar CT image reconstructions and MIPs were
obtained to evaluate the vascular anatomy. Multidetector CT imaging
of the abdomen and pelvis was performed using the standard protocol
during bolus administration of intravenous contrast.
CONTRAST:  100mL OMNIPAQUE IOHEXOL 350 MG/ML SOLN

[Series 5: pe thins · axial · 0.91mm/px · z∈[-35,+109]mm · 3 of 288 slices shown]
[im 72/288  lung]
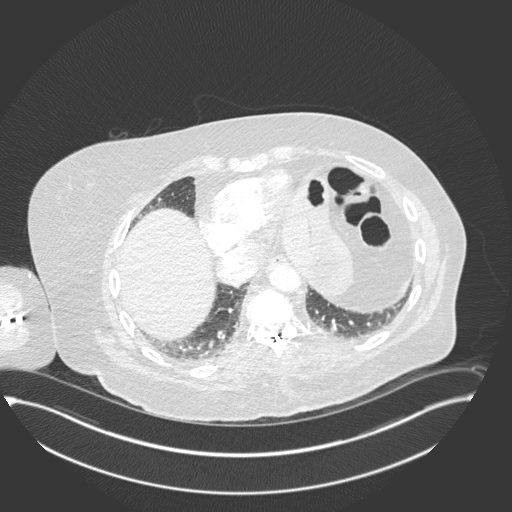
[im 144/288  soft-tissue]
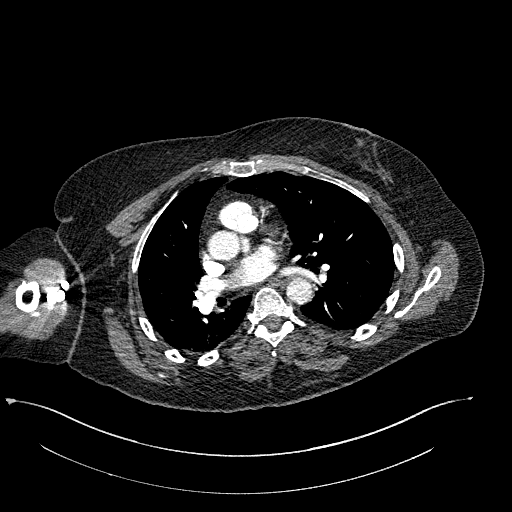
[im 216/288  lung]
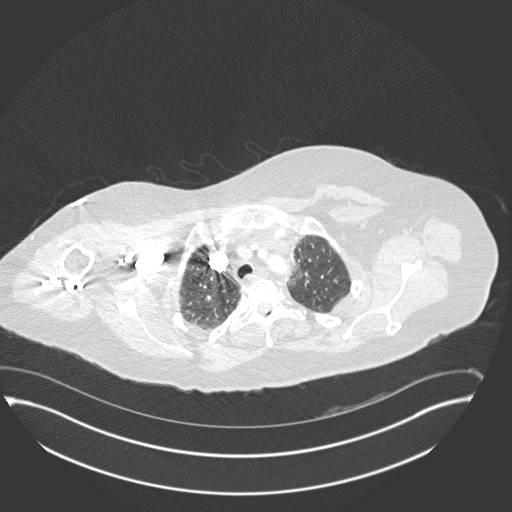

[3 of 16 positions shown; findings below may reference images not displayed]

FINDINGS: CTA CHEST FINDINGS

Cardiovascular: Satisfactory opacification of the pulmonary arteries
to the segmental level. No evidence of pulmonary embolism. Normal
heart size. No pericardial effusion. Calcific atherosclerotic
disease of the aorta and coronary arteries.

Mediastinum/Nodes: No enlarged mediastinal, hilar, or axillary lymph
nodes. Thyroid gland, trachea, and esophagus demonstrate no
significant findings.

Lungs/Pleura: Mostly calcified mass within the superior segment of
the left lower lobe measures 1 cm, likely benign, stable from 3240.

Musculoskeletal: Spinal stimulator enters the spinal canal at the
level of T10. The lead makes a loop on itself around the posterior
process of T11. Small amount of epidural gas is seen within the
spinal canal, likely postprocedural.

Review of the MIP images confirms the above findings.

CT ABDOMEN and PELVIS FINDINGS

Hepatobiliary: Normal attenuation of the liver. Splenic calcified
granulomata. Normal appearance of the gallbladder.

Pancreas: Unremarkable. No pancreatic ductal dilatation or
surrounding inflammatory changes.

Spleen: Splenic granulomata.

Adrenals/Urinary Tract: Adrenal glands are unremarkable. Kidneys are
normal, without renal calculi, focal lesion, or hydronephrosis.
Bladder is unremarkable.

Stomach/Bowel: Stomach is within normal limits. Post appendectomy.
No evidence of bowel wall thickening, distention, or inflammatory
changes.

Vascular/Lymphatic: Aortic atherosclerosis. No enlarged abdominal or
pelvic lymph nodes.

Reproductive: Uterus and bilateral adnexa are unremarkable.

Other: No abdominal wall hernia or abnormality. No abdominopelvic
ascites.

Musculoskeletal: Spondylosis of the lower lumbosacral spine. Prior
resection of the spinous process of L5. Straightening of the
lumbosacral insert logic lordosis. Soft tissue stranding posterior
to L4/L5, likely postsurgical.

Review of the MIP images confirms the above findings.
IMPRESSION: 1. No evidence of pulmonary embolus.
2. Calcific atherosclerotic disease of the aorta and coronary
arteries.
3. No evidence of acute abnormalities within the abdomen or pelvis.
4. Spinal stimulator lead enters the spinal canal at the level of
T10, and makes a loop on itself around the spinous process of T11.
Small amount of epidural gas within the spinal canal, likely
postprocedural.
5. Post L5 spinous process resection. Soft tissue stranding
posterior to L4/L5, likely postsurgical.

Aortic Atherosclerosis ([id number].U).

## 2020-03-29 IMAGING — CT CT ABDOMEN AND PELVIS WITH CONTRAST
2 of 5 series · 15 of 46 positions shown, 17 images · IV contrast (omnipaque)
Comparison: CT of the chest June 03, 2014

CLINICAL DATA: Clinical suspicion for pulmonary embolus. Sore
throat and cough. Lower abdominal pain.

EXAM:
CT ANGIOGRAPHY CHEST
CT ABDOMEN AND PELVIS WITH CONTRAST
TECHNIQUE: Multidetector CT imaging of the chest was performed using the
standard protocol during bolus administration of intravenous
contrast. Multiplanar CT image reconstructions and MIPs were
obtained to evaluate the vascular anatomy. Multidetector CT imaging
of the abdomen and pelvis was performed using the standard protocol
during bolus administration of intravenous contrast.
CONTRAST:  100mL OMNIPAQUE IOHEXOL 350 MG/ML SOLN

[Series 3: a/p w/ 5mm · axial · 0.98mm/px · z∈[-453,+12]mm · 12 of 105 slices shown, 14 images]
[im 6/105  soft-tissue]
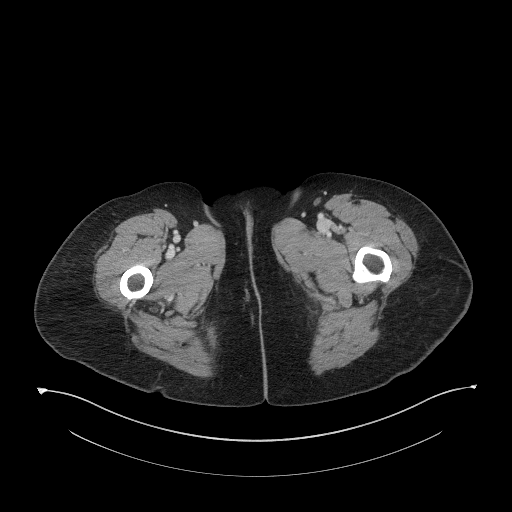
[im 6/105  bone]
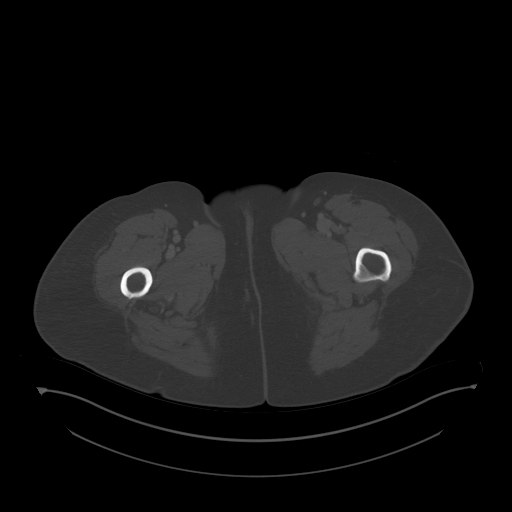
[im 17/105  soft-tissue]
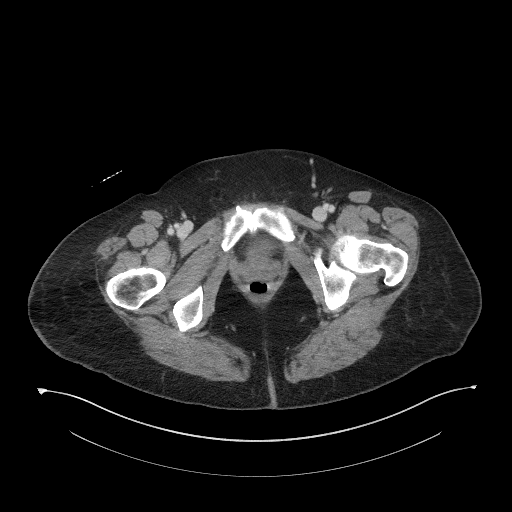
[im 22/105  soft-tissue]
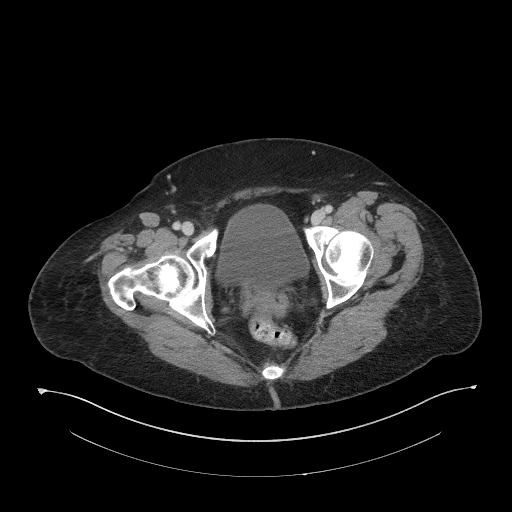
[im 33/105  soft-tissue]
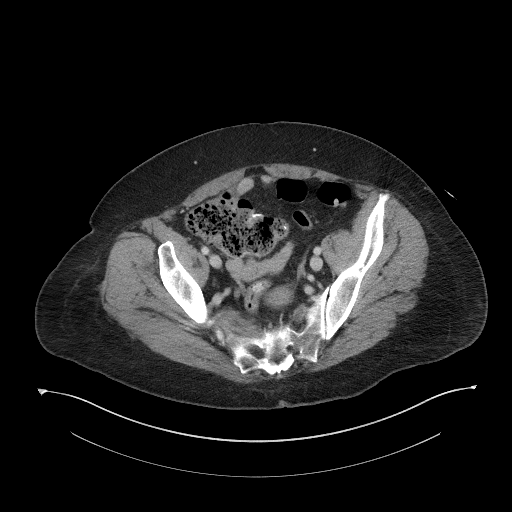
[im 39/105  soft-tissue]
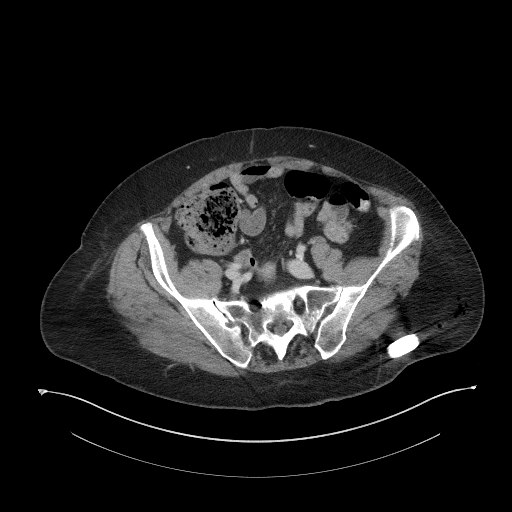
[im 50/105  soft-tissue]
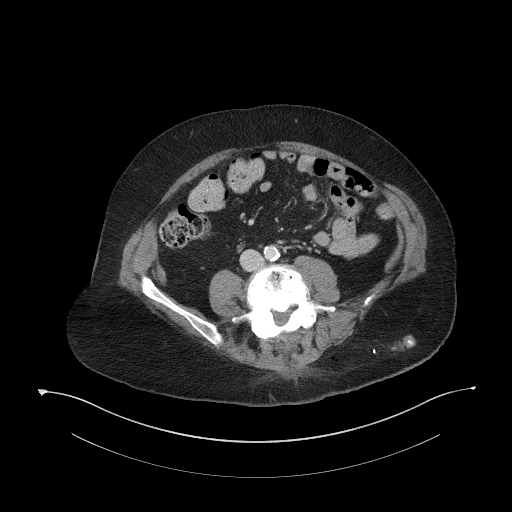
[im 55/105  soft-tissue]
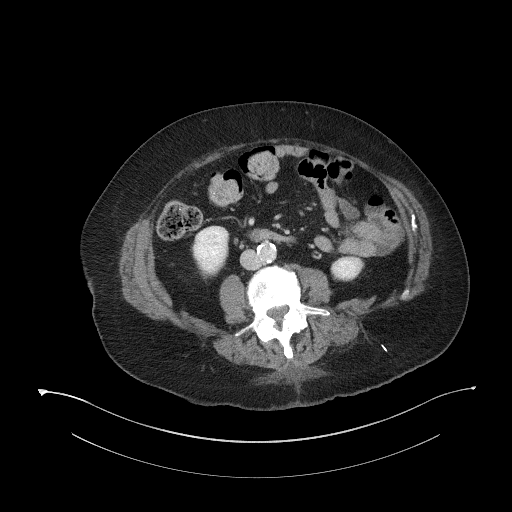
[im 66/105  soft-tissue]
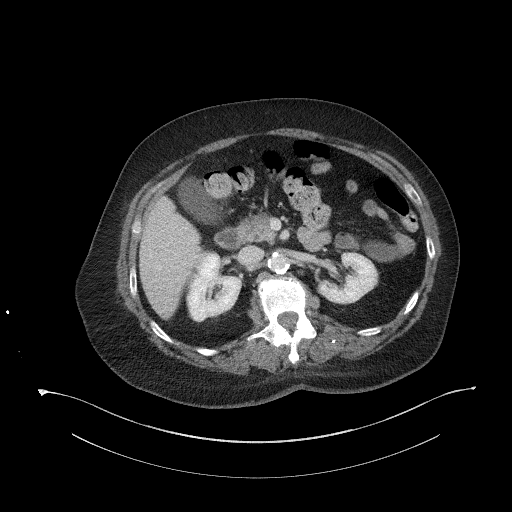
[im 72/105  soft-tissue]
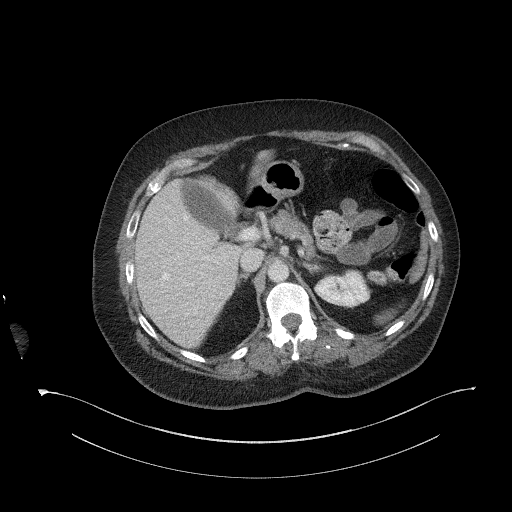
[im 72/105  bone]
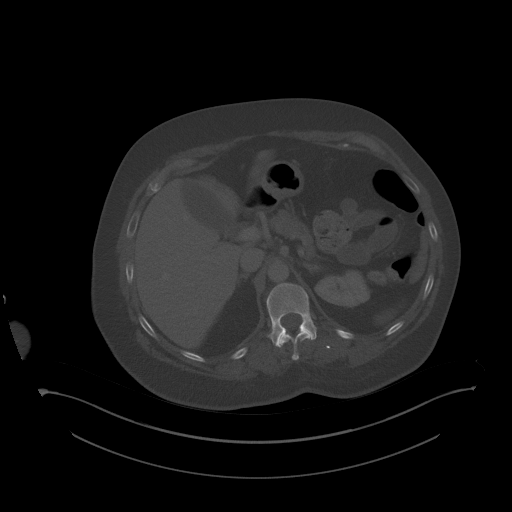
[im 83/105  soft-tissue]
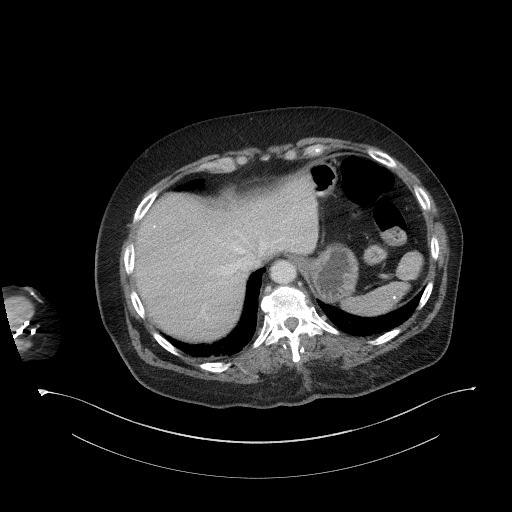
[im 88/105  soft-tissue]
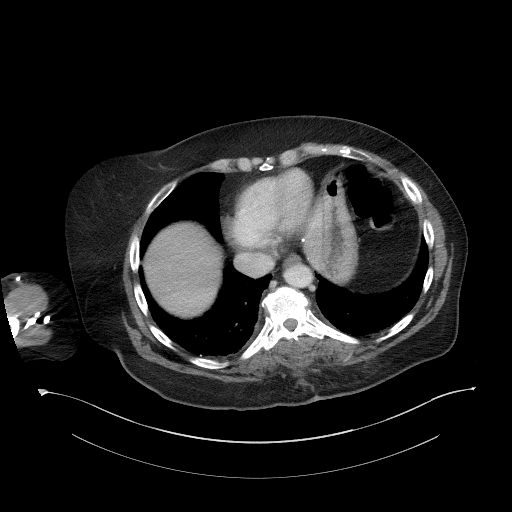
[im 99/105  soft-tissue]
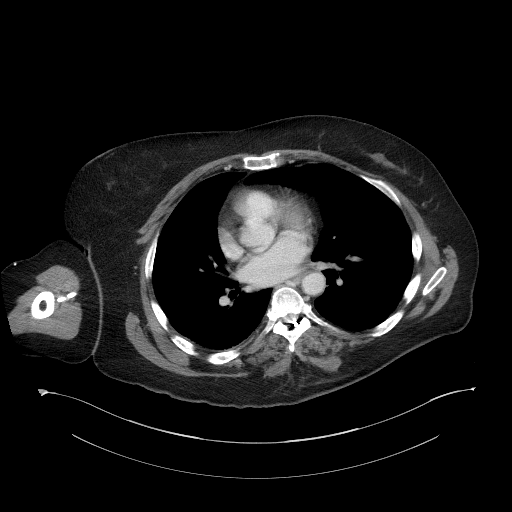

[Series 6: a/p w/ cor · coronal · 1.02mm/px · 3 of 190 slices shown]
[im 64/190  soft-tissue]
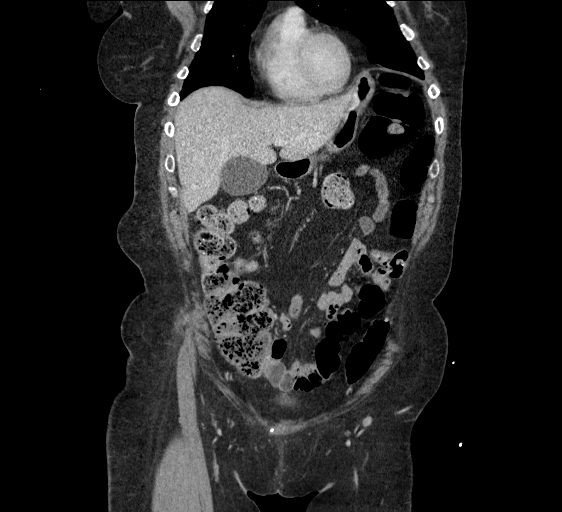
[im 85/190  soft-tissue]
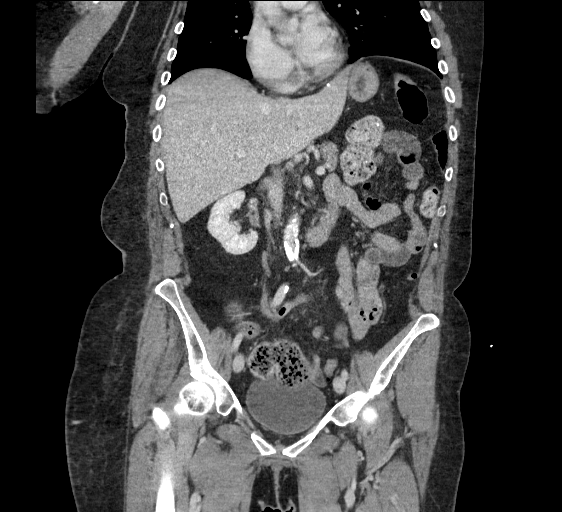
[im 106/190  soft-tissue]
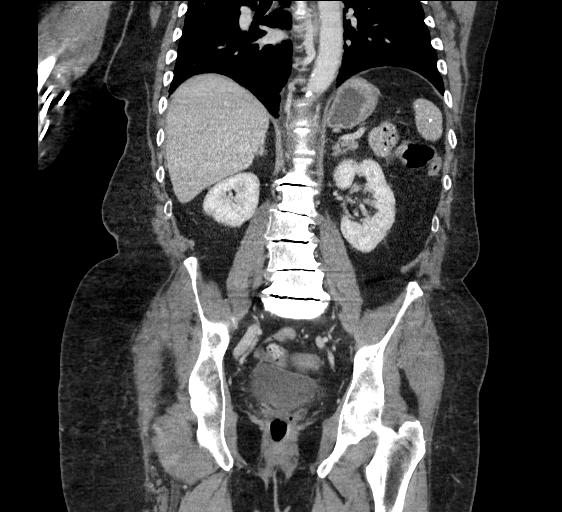

[15 of 46 positions shown; findings below may reference images not displayed]

FINDINGS: CTA CHEST FINDINGS

Cardiovascular: Satisfactory opacification of the pulmonary arteries
to the segmental level. No evidence of pulmonary embolism. Normal
heart size. No pericardial effusion. Calcific atherosclerotic
disease of the aorta and coronary arteries.

Mediastinum/Nodes: No enlarged mediastinal, hilar, or axillary lymph
nodes. Thyroid gland, trachea, and esophagus demonstrate no
significant findings.

Lungs/Pleura: Mostly calcified mass within the superior segment of
the left lower lobe measures 1 cm, likely benign, stable from 3240.

Musculoskeletal: Spinal stimulator enters the spinal canal at the
level of T10. The lead makes a loop on itself around the posterior
process of T11. Small amount of epidural gas is seen within the
spinal canal, likely postprocedural.

Review of the MIP images confirms the above findings.

CT ABDOMEN and PELVIS FINDINGS

Hepatobiliary: Normal attenuation of the liver. Splenic calcified
granulomata. Normal appearance of the gallbladder.

Pancreas: Unremarkable. No pancreatic ductal dilatation or
surrounding inflammatory changes.

Spleen: Splenic granulomata.

Adrenals/Urinary Tract: Adrenal glands are unremarkable. Kidneys are
normal, without renal calculi, focal lesion, or hydronephrosis.
Bladder is unremarkable.

Stomach/Bowel: Stomach is within normal limits. Post appendectomy.
No evidence of bowel wall thickening, distention, or inflammatory
changes.

Vascular/Lymphatic: Aortic atherosclerosis. No enlarged abdominal or
pelvic lymph nodes.

Reproductive: Uterus and bilateral adnexa are unremarkable.

Other: No abdominal wall hernia or abnormality. No abdominopelvic
ascites.

Musculoskeletal: Spondylosis of the lower lumbosacral spine. Prior
resection of the spinous process of L5. Straightening of the
lumbosacral insert logic lordosis. Soft tissue stranding posterior
to L4/L5, likely postsurgical.

Review of the MIP images confirms the above findings.
IMPRESSION: 1. No evidence of pulmonary embolus.
2. Calcific atherosclerotic disease of the aorta and coronary
arteries.
3. No evidence of acute abnormalities within the abdomen or pelvis.
4. Spinal stimulator lead enters the spinal canal at the level of
T10, and makes a loop on itself around the spinous process of T11.
Small amount of epidural gas within the spinal canal, likely
postprocedural.
5. Post L5 spinous process resection. Soft tissue stranding
posterior to L4/L5, likely postsurgical.

Aortic Atherosclerosis ([id number].U).

## 2020-03-30 DIAGNOSIS — M79672 Pain in left foot: Secondary | ICD-10-CM | POA: Diagnosis not present

## 2020-04-07 DIAGNOSIS — M5412 Radiculopathy, cervical region: Secondary | ICD-10-CM | POA: Diagnosis not present

## 2020-04-09 ENCOUNTER — Ambulatory Visit (INDEPENDENT_AMBULATORY_CARE_PROVIDER_SITE_OTHER): Payer: Medicare Other | Admitting: Orthopedic Surgery

## 2020-04-09 ENCOUNTER — Encounter: Payer: Self-pay | Admitting: Orthopedic Surgery

## 2020-04-09 VITALS — Ht 66.0 in | Wt 184.0 lb

## 2020-04-09 DIAGNOSIS — I2584 Coronary atherosclerosis due to calcified coronary lesion: Secondary | ICD-10-CM | POA: Diagnosis not present

## 2020-04-09 DIAGNOSIS — M5412 Radiculopathy, cervical region: Secondary | ICD-10-CM | POA: Diagnosis not present

## 2020-04-09 DIAGNOSIS — Z89511 Acquired absence of right leg below knee: Secondary | ICD-10-CM

## 2020-04-09 DIAGNOSIS — I2510 Atherosclerotic heart disease of native coronary artery without angina pectoris: Secondary | ICD-10-CM | POA: Diagnosis not present

## 2020-04-14 ENCOUNTER — Encounter: Payer: Self-pay | Admitting: Orthopedic Surgery

## 2020-04-14 NOTE — Progress Notes (Signed)
Office Visit Note   Patient: Shelly [name]           Date of Birth: [birth year]           MRN: [id number] Visit Date: 04/09/2020              Requested by: Carol Ada, [place],  [place] [address] PCP: Carol Ada, MD  Chief Complaint  Patient presents with  . Right Leg - Follow-up    2013 BKA       HPI: Patient is a 69 year old woman who is status post a right transtibial amputation in 2013 she currently has a K3 level prosthesis with a broken foot broken vacuum suction.  She states that the broken prosthesis has caused her to fall.  Patient states she also has a C5 compression fracture.  Patient is quite active she cares for her grandchildren she is ambulating on uneven terrain has to go up and down bleachers.  She currently cannot swing her foot through with normal gait.  Assessment & Plan: Visit Diagnoses:  1. History of right below knee amputation [facility])     Plan: Patient will need a new higher demand prosthesis.  She is given a prescription for [employer] for a new microprocessor foot and ankle a new suction socket new liner new materials and supplies.  Patient will also need strength training once she has her new prosthesis.  Follow-Up Instructions: Return if symptoms worsen or fail to improve.   Ortho Exam  Patient is alert, oriented, no adenopathy, well-dressed, normal affect, normal respiratory effort. Examination patient has an unstable prosthesis she is unable to swing the foot through the foot and ankle are broken the suction socket is not working.  Patient is an existing right transtibial  amputee.  Patient's current comorbidities are not expected to impact the ability to function with the prescribed prosthesis. Patient verbally communicates a strong desire to use a prosthesis. Patient currently requires mobility aids to ambulate without a prosthesis.  Expects not to use mobility aids with a new prosthesis.  Patient is a K3  level ambulator that spends a lot of time walking around on uneven terrain over obstacles, up and down stairs, and ambulates with a variable cadence.    Imaging: No results found. No images are attached to the encounter.  Labs: Lab Results  Component Value Date   ESRSEDRATE 2 03/20/2017   ESRSEDRATE 10 09/08/2010     Lab Results  Component Value Date   ALBUMIN 3.2 (L) 01/11/2019    Lab Results  Component Value Date   MG 1.7 02/12/2015   No results found for: VD25OH  No results found for: PREALBUMIN CBC EXTENDED Latest Ref Rng & Units 01/11/2019 01/02/2019 12/11/2017  WBC 4.0 - 10.5 K/uL 10.3 8.7 -  RBC 3.87 - 5.11 MIL/uL 3.47(L) 3.91 -  HGB 12.0 - 15.0 g/dL 10.4(L) 12.0 13.3  HCT 36 - 46 % 34.0(L) 37.8 39.0  PLT 150 - 400 K/uL 309 308 -  NEUTROABS 1.7 - 7.7 K/uL 7.0 - -  LYMPHSABS 0.7 - 4.0 K/uL 2.1 - -     Body mass index is 29.7 kg/m.  Orders:  No orders of the defined types were placed in this encounter.  No orders of the defined types were placed in this encounter.    Procedures: No procedures performed  Clinical Data: No additional findings.  ROS:  All other systems negative, except as noted in the HPI. Review  of Systems  Objective: Vital Signs: Ht 5' 6" (1.676 m)   Wt 184 lb (83.5 kg)   LMP  (LMP Unknown)   BMI 29.70 kg/m   Specialty Comments:  No specialty comments available.  PMFS History: Patient Active Problem List   Diagnosis Date Noted  . Upper airway cough syndrome 03/26/2019  . Chronic pain 01/10/2019  . Memory difficulty 03/20/2017  . Gait abnormality 03/20/2017  . History of right below knee amputation ([place]) 07/11/2016  . Adenomatous colon polyp 05/10/2016  . Coronary artery calcification 01/07/2016  . Essential hypertension 01/07/2016  . Back pain 09/09/2015  . Abnormal CXR 05/16/2014  . Cough variant asthma ? assoc with [facility] 05/15/2014  . Acute post-operative pain 02/18/2012  . Adiposity 02/17/2012  . Does use  eyeglasses 02/13/2012  . H/O arthrodesis 10/21/2011  . Fibromyalgia 04/11/2011  . HLD (hyperlipidemia) 04/11/2011  . Leg edema 09/08/2010   Past Medical History:  Diagnosis Date  . Anxiety   . Arthritis   . Asthma   . Avascular necrosis of talus ([place])   . Cancer ([place])    vulva pre cancer had surgery  . Depression   . Gait abnormality 03/20/2017  . GERD (gastroesophageal reflux disease)   . Hearing loss    "very minor"  . History of pneumonia   . Hyperlipidemia   . Hypertension   . Leg edema    left  . Memory difficulty 03/20/2017  . PONV (postoperative nausea and vomiting)   . Stress incontinence     Family History  Problem Relation Age of Onset  . Dementia Mother 37       alive  . Fibromyalgia Mother   . Allergies Mother   . Heart attack Father 11       deceased  . Multiple myeloma Father   . Other Brother        alive  . Heart disease Brother        emergent CABG for 99% blocked CAD  . Heart murmur Sister 29       she had open heart surgery  . Other Sister 91       She is bIpolar- diabetic  . Breast cancer Sister     Past Surgical History:  Procedure Laterality Date  . ANKLE FUSION  2011   right multiple   . ANKLE FUSION     rear ankle fusion  . ANKLE SURGERY  2010   right cordicompression  . AORTIC ARCH ANGIOGRAPHY N/A 12/11/2017   Procedure: AORTIC ARCH ANGIOGRAPHY;  Surgeon: Elam Dutch, MD;  Location: [facility];  Service: Cardiovascular;  Laterality: N/A;  . APPENDECTOMY  05/10/2016  . BELOW KNEE LEG AMPUTATION    . CATARACT EXTRACTION W/ INTRAOCULAR LENS IMPLANT Left   . LAPAROSCOPIC APPENDECTOMY N/A 05/10/2016   Procedure: APPENDECTOMY LAPAROSCOPIC;  Surgeon: Coralie Keens, MD;  Location: [facility];  Service: General;  Laterality: N/A;  . LUMBAR LAMINECTOMY/DECOMPRESSION MICRODISCECTOMY N/A 09/09/2015   Procedure:  L4-S1 Decompression/ Discetomy;  Surgeon: Melina Schools, MD;  Location: [facility];  Service: Orthopedics;  Laterality: N/A;  .  SKIN GRAFT    . SPINAL CORD STIMULATOR INSERTION N/A 01/10/2019   Procedure: LUMBAR SPINAL CORD STIMULATOR INSERTION;  Surgeon: Melina Schools, MD;  Location: [facility];  Service: Orthopedics;  Laterality: N/A;  2.5 hrs  . TONSILLECTOMY    . TUBAL LIGATION  1983  . VULVECTOMY N/A 01/28/2015   Procedure: WIDE EXCISION VULVECTOMY;  Surgeon: Thurnell Lose, MD;  Location: [facility];  Service: Gynecology;  Laterality: N/A;   Social History   Occupational History  . Occupation: ACCOUNTING    Employer: [employer]  Tobacco Use  . Smoking status: Former Smoker    Packs/day: 1.00    Years: 40.00    Pack years: 40.00    Types: Cigarettes    Quit date: 02/05/2007    Years since quitting: 13.1  . Smokeless tobacco: Never Used  Vaping Use  . Vaping Use: Never used  Substance and Sexual Activity  . Alcohol use: Yes    Alcohol/week: 0.0 standard drinks    Comment: socially  . Drug use: No  . Sexual activity: Not on file

## 2020-04-15 DIAGNOSIS — M5412 Radiculopathy, cervical region: Secondary | ICD-10-CM | POA: Diagnosis not present

## 2020-04-20 DIAGNOSIS — M5412 Radiculopathy, cervical region: Secondary | ICD-10-CM | POA: Diagnosis not present

## 2020-04-21 DIAGNOSIS — M810 Age-related osteoporosis without current pathological fracture: Secondary | ICD-10-CM | POA: Diagnosis not present

## 2020-04-23 DIAGNOSIS — J45991 Cough variant asthma: Secondary | ICD-10-CM

## 2020-04-24 DIAGNOSIS — M5412 Radiculopathy, cervical region: Secondary | ICD-10-CM | POA: Diagnosis not present

## 2020-04-24 MED ORDER — PANTOPRAZOLE SODIUM 40 MG PO TBEC: DELAYED_RELEASE_TABLET | ORAL | 1 refills | Status: DC

## 2020-04-27 DIAGNOSIS — M5412 Radiculopathy, cervical region: Secondary | ICD-10-CM | POA: Diagnosis not present

## 2020-04-28 DIAGNOSIS — Z01419 Encounter for gynecological examination (general) (routine) without abnormal findings: Secondary | ICD-10-CM | POA: Diagnosis not present

## 2020-05-04 ENCOUNTER — Telehealth: Payer: Self-pay | Admitting: *Deleted

## 2020-05-04 DIAGNOSIS — M5412 Radiculopathy, cervical region: Secondary | ICD-10-CM | POA: Diagnosis not present

## 2020-05-04 MED ORDER — PANTOPRAZOLE SODIUM 40 MG PO TBEC: 40.0000 mg | DELAYED_RELEASE_TABLET | Freq: Two times a day (BID) | ORAL | 1 refills | Status: DC

## 2020-05-04 NOTE — Telephone Encounter (Signed)
Called and spoke with patient regarding need for new script for Pantoprazole 40mg  twice daily.  States a script had been sent in for daily for 90 day supply, it was denied twice.  Looked at last visit with Dr. Melvyn Novas, verified to take twice daily.  Script sent to pharmacy for 90 day supply with one refill.  Advised patient that script had been sent to her pharmacy.

## 2020-05-07 DIAGNOSIS — M5412 Radiculopathy, cervical region: Secondary | ICD-10-CM | POA: Diagnosis not present

## 2020-05-15 DIAGNOSIS — R109 Unspecified abdominal pain: Secondary | ICD-10-CM | POA: Diagnosis not present

## 2020-05-15 DIAGNOSIS — M549 Dorsalgia, unspecified: Secondary | ICD-10-CM | POA: Diagnosis not present

## 2020-05-15 DIAGNOSIS — Z818 Family history of other mental and behavioral disorders: Secondary | ICD-10-CM | POA: Diagnosis not present

## 2020-05-15 DIAGNOSIS — N1831 Chronic kidney disease, stage 3a: Secondary | ICD-10-CM | POA: Diagnosis not present

## 2020-05-21 DIAGNOSIS — L853 Xerosis cutis: Secondary | ICD-10-CM | POA: Diagnosis not present

## 2020-05-21 DIAGNOSIS — D0462 Carcinoma in situ of skin of left upper limb, including shoulder: Secondary | ICD-10-CM | POA: Diagnosis not present

## 2020-05-21 DIAGNOSIS — D485 Neoplasm of uncertain behavior of skin: Secondary | ICD-10-CM | POA: Diagnosis not present

## 2020-05-27 DIAGNOSIS — M5412 Radiculopathy, cervical region: Secondary | ICD-10-CM | POA: Diagnosis not present

## 2020-06-02 DIAGNOSIS — M5412 Radiculopathy, cervical region: Secondary | ICD-10-CM | POA: Diagnosis not present

## 2020-06-11 ENCOUNTER — Encounter: Payer: Self-pay | Admitting: Neurology

## 2020-06-11 ENCOUNTER — Ambulatory Visit (INDEPENDENT_AMBULATORY_CARE_PROVIDER_SITE_OTHER): Payer: Medicare Other | Admitting: Neurology

## 2020-06-11 ENCOUNTER — Telehealth: Payer: Self-pay | Admitting: Neurology

## 2020-06-11 VITALS — BP 147/79 | HR 89 | Ht 66.5 in | Wt 189.0 lb

## 2020-06-11 DIAGNOSIS — R269 Unspecified abnormalities of gait and mobility: Secondary | ICD-10-CM

## 2020-06-11 DIAGNOSIS — R413 Other amnesia: Secondary | ICD-10-CM | POA: Diagnosis not present

## 2020-06-11 NOTE — Progress Notes (Signed)
Reason for visit: Memory difficulty  Referring physician: Dr. Lamar Blinks Shelly [name] is a 71 y.o. female  History of present illness:  Shelly [name] is a 70 year old right-handed white female with a history of a mild memory disorder, she was seen initially 3 to 4 years ago for this issue.  MRI of the brain at that time showed mild small vessel changes.  The patient returns for a similar issue.  She believes that her memory has gradually slightly worsened over time.  She has noted some word finding problems, she will repeat herself at times.  She lives alone but she is able to manage well.  She operates a motor vehicle without much difficulty, she manages her medications and keeps up with appointments well and is able to do the finances.  At times, she may have difficulty operating electronics such as how to set up the TV.  She indicates that she is sleeping well, she has a good energy level during the day.  She has a lot of neck and back issues, she has a spinal stimulator in place.  She is followed through orthopedic surgery for her neck, she currently is in physical therapy.  She has a prosthetic right lower extremity following a foot amputation.  She developed some pain at the stump but this has been improved with the topical agent called Mangi-Life.  She reports that she does have some gait instability and she may stumble or fall on occasion but she had noted that the prosthesis was getting loose, she has been measured for a new prosthesis and hopes that this will improve.  The patient comes to the office today for further evaluation.  The patient's mother had dementia.  Past Medical History:  Diagnosis Date  . Anxiety   . Arthritis   . Asthma   . Avascular necrosis of talus ([place])   . Cancer ([place])    vulva pre cancer had surgery  . Depression   . Gait abnormality 03/20/2017  . GERD (gastroesophageal reflux disease)   . Hearing loss    "very minor"  . History of pneumonia   .  Hyperlipidemia   . Hypertension   . Leg edema    left  . Memory difficulty 03/20/2017  . PONV (postoperative nausea and vomiting)   . Stress incontinence     Past Surgical History:  Procedure Laterality Date  . ANKLE FUSION  2011   right multiple   . ANKLE FUSION     rear ankle fusion  . ANKLE SURGERY  2010   right cordicompression  . AORTIC ARCH ANGIOGRAPHY N/A 12/11/2017   Procedure: AORTIC ARCH ANGIOGRAPHY;  Surgeon: Elam Dutch, MD;  Location: [facility];  Service: Cardiovascular;  Laterality: N/A;  . APPENDECTOMY  05/10/2016  . BELOW KNEE LEG AMPUTATION    . CATARACT EXTRACTION W/ INTRAOCULAR LENS IMPLANT Left   . LAPAROSCOPIC APPENDECTOMY N/A 05/10/2016   Procedure: APPENDECTOMY LAPAROSCOPIC;  Surgeon: Coralie Keens, MD;  Location: [facility];  Service: General;  Laterality: N/A;  . LUMBAR LAMINECTOMY/DECOMPRESSION MICRODISCECTOMY N/A 09/09/2015   Procedure:  L4-S1 Decompression/ Discetomy;  Surgeon: Melina Schools, MD;  Location: [facility];  Service: Orthopedics;  Laterality: N/A;  . SKIN GRAFT    . SPINAL CORD STIMULATOR INSERTION N/A 01/10/2019   Procedure: LUMBAR SPINAL CORD STIMULATOR INSERTION;  Surgeon: Melina Schools, MD;  Location: [facility];  Service: Orthopedics;  Laterality: N/A;  2.5 hrs  . TONSILLECTOMY    . TUBAL  LIGATION  1983  . VULVECTOMY N/A 01/28/2015   Procedure: WIDE EXCISION VULVECTOMY;  Surgeon: Thurnell Lose, MD;  Location: [facility];  Service: Gynecology;  Laterality: N/A;    Family History  Problem Relation Age of Onset  . Dementia Mother 41       alive  . Fibromyalgia Mother   . Allergies Mother   . Heart attack Father 71       deceased  . Multiple myeloma Father   . Other Brother        alive  . Heart disease Brother        emergent CABG for 99% blocked CAD  . Heart murmur Sister 41       she had open heart surgery  . Other Sister 32       She is bIpolar- diabetic  . Breast cancer Sister     Social history:  reports that she quit  smoking about 13 years ago. Her smoking use included cigarettes. She has a 40.00 pack-year smoking history. She has never used smokeless tobacco. She reports current alcohol use. She reports that she does not use drugs.  Medications:  Prior to Admission medications   Medication Sig Start Date End Date Taking? Authorizing Provider  acidophilus (RISAQUAD) CAPS capsule Take 1 capsule by mouth daily.   Yes [provider]  aspirin EC 81 MG tablet Take 1 tablet (81 mg total) by mouth daily. 10/09/19  Yes Jettie Booze, MD  benzonatate (TESSALON) 200 MG capsule Take 1 capsule (200 mg total) by mouth 3 (three) times daily as needed for cough. 11/14/19  Yes Tanda Rockers, MD  Biotin 5000 MCG TABS Take 10,000 mcg by mouth every evening.   Yes [provider]  budesonide-formoterol (SYMBICORT) 80-4.5 MCG/ACT inhaler Inhale 2 puffs into the lungs 2 (two) times daily. 03/05/20  Yes Tanda Rockers, MD  budesonide-formoterol (SYMBICORT) 80-4.5 MCG/ACT inhaler INHALE 2 PUFFS INTO THE LUNGS 2 (TWO) TIMES DAILY. 03/05/20  Yes Tanda Rockers, MD  buPROPion (WELLBUTRIN XL) 300 MG 24 hr tablet Take 300 mg by mouth daily.   Yes [provider]  CALCIUM CITRATE PO Take 400 mg by mouth 2 (two) times a day.   Yes [provider]  chlorpheniramine (CHLOR-TRIMETON) 4 MG tablet Take 8 mg by mouth 2 (two) times a day.    Yes [provider]  Cholecalciferol (VITAMIN D PO) Take 5,000 Units by mouth daily.   Yes [provider]  Cyanocobalamin (B-12 PO) Take 5,000 mcg by mouth daily.    Yes [provider]  denosumab (PROLIA) 60 MG/ML SOLN injection Inject 60 mg into the skin every 6 (six) months.    Yes [provider]  desonide (DESOWEN) 0.05 % cream APPLY TO AFFECTED AREA TWICE A DAY 02/22/19  Yes [provider]  FLUoxetine (PROZAC) 20 MG capsule Take 20 mg by mouth daily.   Yes [provider]  Folic Acid-Cholecalciferol 06-4998  MG-UNIT TABS Take by mouth.   Yes [provider]  L-Lysine 1000 MG TABS Take by mouth.   Yes [provider]  losartan (COZAAR) 50 MG tablet Take 50 mg by mouth daily.  09/12/18  Yes [provider]  metaxalone (SKELAXIN) 800 MG tablet Take by mouth.   Yes [provider]  methocarbamol (ROBAXIN) 500 MG tablet Take 500 mg by mouth every 8 (eight) hours as needed. 06/10/19  Yes [provider]  montelukast (SINGULAIR) 10 MG tablet Take 1  tablet (10 mg total) by mouth at bedtime. 06/30/14  Yes Tanda Rockers, MD  Multiple Vitamins-Minerals (CENTRUM SILVER PO) Take 1 tablet by mouth daily.   Yes [provider]  NYSTATIN powder Apply 1 application topically 2 (two) times daily as needed. 04/26/19  Yes [provider]  pantoprazole (PROTONIX) 40 MG tablet Take 1 tablet (40 mg total) by mouth 2 (two) times daily. 05/04/20  Yes Tanda Rockers, MD  predniSONE (DELTASONE) 10 MG tablet Take  4 each am x 2 days,   2 each am x 2 days,  1 each am x 2 days and stop 11/14/19  Yes Tanda Rockers, MD  rosuvastatin (CRESTOR) 10 MG tablet Take 10 mg by mouth every evening.  10/18/17  Yes [provider]  traMADol (ULTRAM) 50 MG tablet Take 1 tablet (50 mg total) by mouth 3 (three) times daily as needed. Up to every 4 hours as needed for cough 01/20/20  Yes Tanda Rockers, MD  valACYclovir (VALTREX) 1000 MG tablet Take 1,000 mg by mouth daily as needed.  06/09/09  Yes [provider]     No Known Allergies  ROS:  Out of a complete 14 system review of symptoms, the patient complains only of the following symptoms, and all other reviewed systems are negative.  Memory problems Gait instability Neck, low back pain  Blood pressure (!) 147/79, pulse 89, height 5' 6.5" (1.689 m), weight 189 lb (85.7 kg).  Physical Exam  General: The patient is alert and cooperative at the time of the examination.  Eyes: Pupils are equal, round, and  reactive to light. Discs are flat bilaterally.  Neck: The neck is supple, no carotid bruits are noted.  Respiratory: The respiratory examination is clear.  Cardiovascular: The cardiovascular examination reveals a regular rate and rhythm, no obvious murmurs or rubs are noted.  Skin: Extremities are without significant edema on the left, she has a right below-knee amputation with a prosthesis.  Neurologic Exam  Mental status: The patient is alert and oriented x 3 at the time of the examination. The patient has apparent normal recent and remote memory, with an apparently normal attention span and concentration ability. MOCA test reveals a score 23/30.  Cranial nerves: Facial symmetry is present. There is good sensation of the face to pinprick and soft touch bilaterally. The strength of the facial muscles and the muscles to head turning and shoulder shrug are normal bilaterally. Speech is well enunciated, no aphasia or dysarthria is noted. Extraocular movements are full. Visual fields are full. The tongue is midline, and the patient has symmetric elevation of the soft palate. No obvious hearing deficits are noted.  Motor: The motor testing reveals 5 over 5 strength of all 4 extremities. Good symmetric motor tone is noted throughout.  Sensory: Sensory testing is intact to pinprick, soft touch, vibration sensation, and position sense on all 4 extremities. No evidence of extinction is noted.  Coordination: Cerebellar testing reveals good finger-nose-finger and heel-to-shin bilaterally.  Gait and station: Gait is relatively normal, minimally unsteady. Tandem gait was not tested. Romberg is negative. No drift is seen.  Reflexes: Deep tendon reflexes are symmetric, but are depressed bilaterally. Toes are downgoing bilaterally.   MRI brain 03/30/17:  IMPRESSION:   MRI brain (without) demonstrating: 1. Mild periventricular and subcortical chronic small vessel ischemic disease.  2. No acute  findings.  * MRI scan images were reviewed online. I agree with the written report.    Assessment/Plan:  1.  Mild cognitive impairment  2.  Mild gait instability  3.  Chronic neck and low back pain  The patient has a mild memory issue that has not progressed much since last seen.  We will follow this over time, I will repeat a CT scan of the head, she cannot have MRI with a spinal stimulator.  She will follow up here in 8 months.  Jill Alexanders MD 06/11/2020 3:07 PM  [facility] [address] [place], [place] [address]  Phone [phone] Fax [phone]

## 2020-06-11 NOTE — Telephone Encounter (Signed)
Medicare/bcbs supp order sent to GI. No auth they will reach out to the patient to schedule.  

## 2020-06-12 DIAGNOSIS — M5412 Radiculopathy, cervical region: Secondary | ICD-10-CM | POA: Diagnosis not present

## 2020-06-12 DIAGNOSIS — M545 Low back pain, unspecified: Secondary | ICD-10-CM | POA: Diagnosis not present

## 2020-06-18 DIAGNOSIS — M5412 Radiculopathy, cervical region: Secondary | ICD-10-CM | POA: Diagnosis not present

## 2020-06-24 DIAGNOSIS — B349 Viral infection, unspecified: Secondary | ICD-10-CM | POA: Diagnosis not present

## 2020-06-24 DIAGNOSIS — U071 COVID-19: Secondary | ICD-10-CM | POA: Diagnosis not present

## 2020-06-25 ENCOUNTER — Other Ambulatory Visit: Payer: Medicare Other

## 2020-06-29 DIAGNOSIS — F325 Major depressive disorder, single episode, in full remission: Secondary | ICD-10-CM | POA: Diagnosis not present

## 2020-06-29 DIAGNOSIS — M810 Age-related osteoporosis without current pathological fracture: Secondary | ICD-10-CM | POA: Diagnosis not present

## 2020-06-29 DIAGNOSIS — E785 Hyperlipidemia, unspecified: Secondary | ICD-10-CM | POA: Diagnosis not present

## 2020-06-29 DIAGNOSIS — K219 Gastro-esophageal reflux disease without esophagitis: Secondary | ICD-10-CM | POA: Diagnosis not present

## 2020-06-29 DIAGNOSIS — N1831 Chronic kidney disease, stage 3a: Secondary | ICD-10-CM | POA: Diagnosis not present

## 2020-06-29 DIAGNOSIS — I10 Essential (primary) hypertension: Secondary | ICD-10-CM | POA: Diagnosis not present

## 2020-06-29 DIAGNOSIS — G459 Transient cerebral ischemic attack, unspecified: Secondary | ICD-10-CM | POA: Diagnosis not present

## 2020-06-29 DIAGNOSIS — F329 Major depressive disorder, single episode, unspecified: Secondary | ICD-10-CM | POA: Diagnosis not present

## 2020-06-29 DIAGNOSIS — M542 Cervicalgia: Secondary | ICD-10-CM | POA: Diagnosis not present

## 2020-06-29 DIAGNOSIS — M545 Low back pain, unspecified: Secondary | ICD-10-CM | POA: Diagnosis not present

## 2020-06-29 NOTE — Progress Notes (Deleted)
Cardiology Office Note   Date:  06/29/2020   ID:  Shelly [name], Shelly [name] [birth year], MRN [id number]  PCP:  Carol Ada, MD    No chief complaint on file.  coronary calcification  Wt Readings from Last 3 Encounters:  06/11/20 189 lb (85.7 kg)  04/09/20 184 lb (83.5 kg)  01/09/20 184 lb (83.5 kg)       History of Present Illness: Shelly [name] is a 70 y.o. female   Who had coronary calcium noted in 2015. She was asymptomatic and then had a stress test that was negative. She has RF for CAD including high cholesterol. Her brother had CABGin he past few years.  SHe had back surgeryin 2017and there were no cardiac issues at that time.   She saw vascular surgery as follows: "Shelly [name] a 70 y.o.femalereferred for evaluation after recent TIA event with right subclavian stenosis. On June 1 of this year the patient had an event where she had severe confusion and difficulty driving and understanding her actions. She was sent to the emergency room for evaluation and had a CT Angio of the neck which showed a right subclavian artery stenosis an MRI scan which showed no evidence of stroke but possible symptoms of TIA. She was also seen by her neurologist. She currently is on aspirin and Crestor. She denies any prior TIA events. 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 She did have an MRI in October 2018 which showed no infarct."  Angio by Dr. Oneida Alar in 7/19 showed: "The patient apparently had a recent TIA event. CT scan suggested a high-grade right subclavian artery stenosis. She has symmetric blood pressures. Angiogram shows 40% of the most narrowing of the right subclavian artery. I do not believe that this is the etiology of her TIA. The rest of her great vessels are widely patent. I do not believe from a vascular standpoint we can explain her current TIA symptoms. I will defer this to her neurologist Dr.  [name] to see if any other further work-up or evaluation is necessary. From my standpoint it is okay for the patient to drive starting tomorrow.."  She has been checked for dementia. She had some vascular changes in her brain  In Jan 2020, she was considering Keto diet. I explained that from a heart standpoint, plant based and mediterranean diet have better data looking forward at reducing coronary events.      Past Medical History:  Diagnosis Date  . Anxiety   . Arthritis   . Asthma   . Avascular necrosis of talus ([place])   . Cancer ([place])    vulva pre cancer had surgery  . Depression   . Gait abnormality 03/20/2017  . GERD (gastroesophageal reflux disease)   . Hearing loss    "very minor"  . History of pneumonia   . Hyperlipidemia   . Hypertension   . Leg edema    left  . Memory difficulty 03/20/2017  . PONV (postoperative nausea and vomiting)   . Stress incontinence     Past Surgical History:  Procedure Laterality Date  . ANKLE FUSION  2011   right multiple   . ANKLE FUSION     rear ankle fusion  . ANKLE SURGERY  2010   right cordicompression  . AORTIC ARCH ANGIOGRAPHY N/A 12/11/2017   Procedure: AORTIC ARCH ANGIOGRAPHY;  Surgeon: Elam Dutch, MD;  Location: [facility];  Service: Cardiovascular;  Laterality: N/A;  . APPENDECTOMY  05/10/2016  . BELOW KNEE LEG AMPUTATION    . CATARACT EXTRACTION W/ INTRAOCULAR LENS IMPLANT Left   . LAPAROSCOPIC APPENDECTOMY N/A 05/10/2016   Procedure: APPENDECTOMY LAPAROSCOPIC;  Surgeon: Coralie Keens, MD;  Location: [facility];  Service: General;  Laterality: N/A;  . LUMBAR LAMINECTOMY/DECOMPRESSION MICRODISCECTOMY N/A 09/09/2015   Procedure:  L4-S1 Decompression/ Discetomy;  Surgeon: Melina Schools, MD;  Location: [facility];  Service: Orthopedics;  Laterality: N/A;  . SKIN GRAFT    . SPINAL CORD STIMULATOR INSERTION N/A 01/10/2019   Procedure: LUMBAR SPINAL CORD STIMULATOR INSERTION;  Surgeon: Melina Schools, MD;  Location: [facility];  Service: Orthopedics;  Laterality: N/A;  2.5 hrs  . TONSILLECTOMY    . TUBAL LIGATION  1983  . VULVECTOMY N/A 01/28/2015   Procedure: WIDE EXCISION VULVECTOMY;  Surgeon: Thurnell Lose, MD;  Location: [facility];  Service: Gynecology;  Laterality: N/A;     Current Outpatient Medications  Medication Sig Dispense Refill  . acidophilus (RISAQUAD) CAPS capsule Take 1 capsule by mouth daily.    Marland Kitchen aspirin EC 81 MG tablet Take 1 tablet (81 mg total) by mouth daily. 90 tablet 3  . benzonatate (TESSALON) 200 MG capsule Take 1 capsule (200 mg total) by mouth 3 (three) times daily as needed for cough. 90 capsule 11  . Biotin 5000 MCG TABS Take 10,000 mcg by mouth every evening.    . budesonide-formoterol (SYMBICORT) 80-4.5 MCG/ACT inhaler Inhale 2 puffs into the lungs 2 (two) times daily. 10.7 g 11  . buPROPion (WELLBUTRIN XL) 300 MG 24 hr tablet Take 300 mg by mouth daily.    Marland Kitchen CALCIUM CITRATE PO Take 400 mg by mouth 2 (two) times a day.    . chlorpheniramine (CHLOR-TRIMETON) 4 MG tablet Take 8 mg by mouth 2 (two) times a day.     . Cholecalciferol (VITAMIN D PO) Take 5,000 Units by mouth daily.    . Cyanocobalamin (B-12 PO) Take 5,000 mcg by mouth daily.     Marland Kitchen denosumab (PROLIA) 60 MG/ML SOLN injection Inject 60 mg into the skin every 6 (six) months.     . desonide (DESOWEN) 0.05 % cream APPLY TO AFFECTED AREA TWICE A DAY    . FLUoxetine (PROZAC) 20 MG capsule Take 20 mg by mouth daily.    . Folic Acid-Cholecalciferol 06-4998 MG-UNIT TABS Take by mouth.    . L-Lysine 1000 MG TABS Take by mouth.    . losartan (COZAAR) 50 MG tablet Take 50 mg by mouth daily.     . methocarbamol (ROBAXIN) 500 MG tablet Take 500 mg by mouth every 8 (eight) hours as needed.    . montelukast (SINGULAIR) 10 MG tablet Take 1 tablet (10 mg total) by mouth at bedtime. 30 tablet 11  . Multiple Vitamins-Minerals (CENTRUM SILVER PO) Take 1 tablet by mouth daily.    . NYSTATIN powder Apply 1 application topically 2 (two) times  daily as needed.    . pantoprazole (PROTONIX) 40 MG tablet Take 1 tablet (40 mg total) by mouth 2 (two) times daily. 180 tablet 1  . predniSONE (DELTASONE) 10 MG tablet Take  4 each am x 2 days,   2 each am x 2 days,  1 each am x 2 days and stop 14 tablet 0  . rosuvastatin (CRESTOR) 10 MG tablet Take 10 mg by mouth every evening.   1  . traMADol (ULTRAM) 50 MG tablet Take 1 tablet (50 mg total) by mouth 3 (  three) times daily as needed. Up to every 4 hours as needed for cough 40 tablet 0  . valACYclovir (VALTREX) 1000 MG tablet Take 1,000 mg by mouth daily as needed.      No current facility-administered medications for this visit.    Allergies:   Patient has no known allergies.    Social History:  The patient  reports that she quit smoking about 13 years ago. Her smoking use included cigarettes. She has a 40.00 pack-year smoking history. She has never used smokeless tobacco. She reports current alcohol use. She reports that she does not use drugs.   Family History:  The patient's ***family history includes Allergies in her mother; Breast cancer in her sister; Dementia (age of onset: 27) in her mother; Fibromyalgia in her mother; Heart attack (age of onset: 30) in her father; Heart disease in her brother; Heart murmur (age of onset: 41) in her sister; Multiple myeloma in her father; Other in her brother; Other (age of onset: 76) in her sister.    ROS:  Please see the history of present illness.   Otherwise, review of systems are positive for ***.   All other systems are reviewed and negative.    PHYSICAL EXAM: VS:  LMP  (LMP Unknown)  , BMI There is no height or weight on file to calculate BMI. GEN: Well nourished, well developed, in no acute distress HEENT: normal Neck: no JVD, carotid bruits, or masses Cardiac: ***RRR; no murmurs, rubs, or gallops,no edema  Respiratory:  clear to auscultation bilaterally, normal work of breathing GI: soft, nontender, nondistended, + BS MS: no deformity  or atrophy Skin: warm and dry, no rash Neuro:  Strength and sensation are intact Psych: euthymic mood, full affect   EKG:   The ekg ordered today demonstrates ***   Recent Labs: No results found for requested labs within last 8760 hours.   Lipid Panel No results found for: CHOL, TRIG, HDL, CHOLHDL, VLDL, LDLCALC, LDLDIRECT   Other studies Reviewed: Additional studies/ records that were reviewed today with results demonstrating: ***.   ASSESSMENT AND PLAN:  1. Coronary artery calcium: 2. Hyperlipidemia: 3. HTN: 4. PAD:   Current medicines are reviewed at length with the patient today.  The patient concerns regarding her medicines were addressed.  The following changes have been made:  No change***  Labs/ tests ordered today include: *** No orders of the defined types were placed in this encounter.   Recommend 150 minutes/week of aerobic exercise Low fat, low carb, high fiber diet recommended  Disposition:   FU in ***   Signed, Larae Grooms, MD  06/29/2020 1:07 PM    [facility] [place], [place], [name]  [address] Phone: [phone]; Fax: [phone]

## 2020-06-30 ENCOUNTER — Ambulatory Visit: Payer: Medicare Other | Admitting: Interventional Cardiology

## 2020-06-30 ENCOUNTER — Other Ambulatory Visit: Payer: Self-pay

## 2020-06-30 DIAGNOSIS — I739 Peripheral vascular disease, unspecified: Secondary | ICD-10-CM

## 2020-06-30 DIAGNOSIS — I2510 Atherosclerotic heart disease of native coronary artery without angina pectoris: Secondary | ICD-10-CM

## 2020-06-30 DIAGNOSIS — I10 Essential (primary) hypertension: Secondary | ICD-10-CM

## 2020-06-30 DIAGNOSIS — E785 Hyperlipidemia, unspecified: Secondary | ICD-10-CM

## 2020-07-02 DIAGNOSIS — L239 Allergic contact dermatitis, unspecified cause: Secondary | ICD-10-CM | POA: Diagnosis not present

## 2020-07-06 DIAGNOSIS — M545 Low back pain, unspecified: Secondary | ICD-10-CM | POA: Diagnosis not present

## 2020-07-06 DIAGNOSIS — M542 Cervicalgia: Secondary | ICD-10-CM | POA: Diagnosis not present

## 2020-07-09 DIAGNOSIS — M5416 Radiculopathy, lumbar region: Secondary | ICD-10-CM | POA: Diagnosis not present

## 2020-07-10 ENCOUNTER — Ambulatory Visit
Admission: RE | Admit: 2020-07-10 | Discharge: 2020-07-10 | Disposition: A | Discharge status: Home / Self Care | Payer: Medicare Other | Source: Ambulatory Visit | Attending: Neurology | Admitting: Neurology

## 2020-07-10 DIAGNOSIS — M542 Cervicalgia: Secondary | ICD-10-CM | POA: Diagnosis not present

## 2020-07-10 DIAGNOSIS — R413 Other amnesia: Secondary | ICD-10-CM | POA: Diagnosis not present

## 2020-07-10 DIAGNOSIS — M545 Low back pain, unspecified: Secondary | ICD-10-CM | POA: Diagnosis not present

## 2020-07-11 ENCOUNTER — Telehealth: Payer: Self-pay | Admitting: Neurology

## 2020-07-11 NOTE — Telephone Encounter (Signed)
  I called the patient. CT of the brain is showing only age-related changes. The patient will be followed for the memory issues, we will see her back in September.  CT head 07/10/20:  IMPRESSION: Unremarkable CT scan of the head without contrast showing only mild age-related changes of small vessel disease.  No acute abnormalities.

## 2020-07-16 DIAGNOSIS — M5412 Radiculopathy, cervical region: Secondary | ICD-10-CM | POA: Diagnosis not present

## 2020-07-16 DIAGNOSIS — M545 Low back pain, unspecified: Secondary | ICD-10-CM | POA: Diagnosis not present

## 2020-07-17 DIAGNOSIS — L814 Other melanin hyperpigmentation: Secondary | ICD-10-CM | POA: Diagnosis not present

## 2020-07-17 DIAGNOSIS — L570 Actinic keratosis: Secondary | ICD-10-CM | POA: Diagnosis not present

## 2020-07-17 DIAGNOSIS — L821 Other seborrheic keratosis: Secondary | ICD-10-CM | POA: Diagnosis not present

## 2020-07-17 DIAGNOSIS — L245 Irritant contact dermatitis due to other chemical products: Secondary | ICD-10-CM | POA: Diagnosis not present

## 2020-07-17 DIAGNOSIS — L239 Allergic contact dermatitis, unspecified cause: Secondary | ICD-10-CM | POA: Diagnosis not present

## 2020-07-23 DIAGNOSIS — M545 Low back pain, unspecified: Secondary | ICD-10-CM | POA: Diagnosis not present

## 2020-07-23 DIAGNOSIS — M542 Cervicalgia: Secondary | ICD-10-CM | POA: Diagnosis not present

## 2020-07-27 DIAGNOSIS — M545 Low back pain, unspecified: Secondary | ICD-10-CM | POA: Diagnosis not present

## 2020-07-27 DIAGNOSIS — M542 Cervicalgia: Secondary | ICD-10-CM | POA: Diagnosis not present

## 2020-07-29 ENCOUNTER — Other Ambulatory Visit: Payer: Self-pay

## 2020-07-29 ENCOUNTER — Telehealth: Payer: Self-pay | Admitting: Physical Therapy

## 2020-07-29 DIAGNOSIS — M5412 Radiculopathy, cervical region: Secondary | ICD-10-CM | POA: Diagnosis not present

## 2020-07-29 DIAGNOSIS — M5136 Other intervertebral disc degeneration, lumbar region: Secondary | ICD-10-CM | POA: Diagnosis not present

## 2020-07-29 DIAGNOSIS — M47816 Spondylosis without myelopathy or radiculopathy, lumbar region: Secondary | ICD-10-CM | POA: Diagnosis not present

## 2020-07-29 DIAGNOSIS — Z89511 Acquired absence of right leg below knee: Secondary | ICD-10-CM

## 2020-07-29 NOTE — Telephone Encounter (Signed)
Of course no problem it has been ordered.

## 2020-07-29 NOTE — Telephone Encounter (Signed)
This patient & Brooke Pace, [facility] are recommending PT for Ms. Patchen due to balance issues.  She also recently received a new prosthesis that functions differently. Can you please place a PT referral in Epic for her? Thank you Shirlean Mylar

## 2020-08-03 DIAGNOSIS — K219 Gastro-esophageal reflux disease without esophagitis: Secondary | ICD-10-CM | POA: Diagnosis not present

## 2020-08-03 DIAGNOSIS — N1831 Chronic kidney disease, stage 3a: Secondary | ICD-10-CM | POA: Diagnosis not present

## 2020-08-03 DIAGNOSIS — F325 Major depressive disorder, single episode, in full remission: Secondary | ICD-10-CM | POA: Diagnosis not present

## 2020-08-03 DIAGNOSIS — G459 Transient cerebral ischemic attack, unspecified: Secondary | ICD-10-CM | POA: Diagnosis not present

## 2020-08-03 DIAGNOSIS — F329 Major depressive disorder, single episode, unspecified: Secondary | ICD-10-CM | POA: Diagnosis not present

## 2020-08-03 DIAGNOSIS — M810 Age-related osteoporosis without current pathological fracture: Secondary | ICD-10-CM | POA: Diagnosis not present

## 2020-08-03 DIAGNOSIS — E785 Hyperlipidemia, unspecified: Secondary | ICD-10-CM | POA: Diagnosis not present

## 2020-08-03 DIAGNOSIS — I10 Essential (primary) hypertension: Secondary | ICD-10-CM | POA: Diagnosis not present

## 2020-08-04 NOTE — Progress Notes (Unsigned)
Cardiology Office Note   Date:  08/06/2020   ID:  Shelly [name], Shelly [name] 12-28-1950, MRN [id number]  PCP:  Carol Ada, MD    No chief complaint on file.  Coronary artery calcium  Wt Readings from Last 3 Encounters:  06/11/20 189 lb (85.7 kg)  04/09/20 184 lb (83.5 kg)  01/09/20 184 lb (83.5 kg)       History of Present Illness: Shelly [name] is a 70 y.o. female  Who had coronary calcium noted in 2015. She was asymptomatic and then had a stress test that was negative. She has RF for CAD including high cholesterol. Her brother had CABGin he past few years.  SHe had back surgeryin 2017and there were no cardiac issues at that time.   She saw vascular surgery as follows: "Shelly [name] a 70 y.o.femalereferred for evaluation after recent TIA event with right subclavian stenosis. On June 1 of this year the patient had an event where she had severe confusion and difficulty driving and understanding her actions. She was sent to the emergency room for evaluation and had a CT Angio of the neck which showed a right subclavian artery stenosis an MRI scan which showed no evidence of stroke but possible symptoms of TIA. She was also seen by her neurologist. She currently is on aspirin and Crestor. She denies any prior TIA events. 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 She did have an MRI in October 2018 which showed no infarct."  Angio by Dr. Oneida Alar in 7/19 showed: "The patient apparently had a recent TIA event. CT scan suggested a high-grade right subclavian artery stenosis. She has symmetric blood pressures. Angiogram shows 40% of the most narrowing of the right subclavian artery. I do not believe that this is the etiology of her TIA. The rest of her great vessels are widely patent. I do not believe from a vascular standpoint we can explain her current TIA symptoms. I will defer this to her neurologist Dr.  [name] to see if any other further work-up or evaluation is necessary. From my standpoint it is okay for the patient to drive starting tomorrow.."  She has been checked for dementia. She had some vascular changes in her brain  In Jan 2020, she was considering Keto diet. I explained that from a heart standpoint, plant based and mediterranean diet have better data looking forward at reducing coronary events.   Since the last visit, she has felt well.  She has had cataract surgery, spinal cord stimulator in 2020.  Exercise in the past: "She walks with her dog, 20-25 miles/week.  Limited by leg pain, but not from a cardiac standpoint."  She has had a persistent cough for several years.  Related to GERD.   Got a new prosthetic leg in 2021.  Has articulating ankle and falls have stopped.   Past Medical History:  Diagnosis Date  . Anxiety   . Arthritis   . Asthma   . Avascular necrosis of talus ([place])   . Cancer ([place])    vulva pre cancer had surgery  . Depression   . Gait abnormality 03/20/2017  . GERD (gastroesophageal reflux disease)   . Hearing loss    "very minor"  . History of pneumonia   . Hyperlipidemia   . Hypertension   . Leg edema    left  . Memory difficulty 03/20/2017  . PONV (postoperative nausea and vomiting)   . Stress  incontinence     Past Surgical History:  Procedure Laterality Date  . ANKLE FUSION  2011   right multiple   . ANKLE FUSION     rear ankle fusion  . ANKLE SURGERY  2010   right cordicompression  . AORTIC ARCH ANGIOGRAPHY N/A 12/11/2017   Procedure: AORTIC ARCH ANGIOGRAPHY;  Surgeon: Elam Dutch, MD;  Location: [facility];  Service: Cardiovascular;  Laterality: N/A;  . APPENDECTOMY  05/10/2016  . BELOW KNEE LEG AMPUTATION    . CATARACT EXTRACTION W/ INTRAOCULAR LENS IMPLANT Left   . LAPAROSCOPIC APPENDECTOMY N/A 05/10/2016   Procedure: APPENDECTOMY LAPAROSCOPIC;  Surgeon: Coralie Keens, MD;  Location: [facility];  Service: General;   Laterality: N/A;  . LUMBAR LAMINECTOMY/DECOMPRESSION MICRODISCECTOMY N/A 09/09/2015   Procedure:  L4-S1 Decompression/ Discetomy;  Surgeon: Melina Schools, MD;  Location: [facility];  Service: Orthopedics;  Laterality: N/A;  . SKIN GRAFT    . SPINAL CORD STIMULATOR INSERTION N/A 01/10/2019   Procedure: LUMBAR SPINAL CORD STIMULATOR INSERTION;  Surgeon: Melina Schools, MD;  Location: [facility];  Service: Orthopedics;  Laterality: N/A;  2.5 hrs  . TONSILLECTOMY    . TUBAL LIGATION  1983  . VULVECTOMY N/A 01/28/2015   Procedure: WIDE EXCISION VULVECTOMY;  Surgeon: Thurnell Lose, MD;  Location: [facility];  Service: Gynecology;  Laterality: N/A;     Current Outpatient Medications  Medication Sig Dispense Refill  . acidophilus (RISAQUAD) CAPS capsule Take 1 capsule by mouth daily.    Marland Kitchen aspirin EC 81 MG tablet Take 1 tablet (81 mg total) by mouth daily. 90 tablet 3  . benzonatate (TESSALON) 200 MG capsule Take 1 capsule (200 mg total) by mouth 3 (three) times daily as needed for cough. 90 capsule 11  . Biotin 5000 MCG TABS Take 10,000 mcg by mouth every evening.    . budesonide-formoterol (SYMBICORT) 80-4.5 MCG/ACT inhaler Inhale 2 puffs into the lungs 2 (two) times daily. 10.7 g 11  . buPROPion (WELLBUTRIN XL) 300 MG 24 hr tablet Take 300 mg by mouth daily.    Marland Kitchen CALCIUM CITRATE PO Take 400 mg by mouth 2 (two) times a day.    . chlorpheniramine (CHLOR-TRIMETON) 4 MG tablet Take 8 mg by mouth 2 (two) times a day.     . Cholecalciferol (VITAMIN D PO) Take 5,000 Units by mouth daily.    . Cyanocobalamin (B-12 PO) Take 5,000 mcg by mouth daily.     Marland Kitchen denosumab (PROLIA) 60 MG/ML SOLN injection Inject 60 mg into the skin every 6 (six) months.     . desonide (DESOWEN) 0.05 % cream APPLY TO AFFECTED AREA TWICE A DAY    . FLUoxetine (PROZAC) 20 MG capsule Take 20 mg by mouth daily.    . Folic Acid-Cholecalciferol 06-4998 MG-UNIT TABS Take by mouth.    . L-Lysine 1000 MG TABS Take by mouth.    . losartan (COZAAR) 50 MG  tablet Take 50 mg by mouth daily.     . methocarbamol (ROBAXIN) 500 MG tablet Take 500 mg by mouth every 8 (eight) hours as needed.    . montelukast (SINGULAIR) 10 MG tablet Take 1 tablet (10 mg total) by mouth at bedtime. 30 tablet 11  . Multiple Vitamins-Minerals (CENTRUM SILVER PO) Take 1 tablet by mouth daily.    . NYSTATIN powder Apply 1 application topically 2 (two) times daily as needed.    . pantoprazole (PROTONIX) 40 MG tablet Take 1 tablet (40 mg total) by mouth 2 (two)  times daily. 180 tablet 1  . predniSONE (DELTASONE) 10 MG tablet Take  4 each am x 2 days,   2 each am x 2 days,  1 each am x 2 days and stop 14 tablet 0  . rosuvastatin (CRESTOR) 10 MG tablet Take 10 mg by mouth every evening.   1  . traMADol (ULTRAM) 50 MG tablet Take 1 tablet (50 mg total) by mouth 3 (three) times daily as needed. Up to every 4 hours as needed for cough 40 tablet 0  . valACYclovir (VALTREX) 1000 MG tablet Take 1,000 mg by mouth daily as needed.      No current facility-administered medications for this visit.    Allergies:   Patient has no known allergies.    Social History:  The patient  reports that she quit smoking about 13 years ago. Her smoking use included cigarettes. She has a 40.00 pack-year smoking history. She has never used smokeless tobacco. She reports current alcohol use. She reports that she does not use drugs.   Family History:  The patient's family history includes Allergies in her mother; Breast cancer in her sister; Dementia (age of onset: 11) in her mother; Fibromyalgia in her mother; Heart attack (age of onset: 53) in her father; Heart disease in her brother; Heart murmur (age of onset: 29) in her sister; Multiple myeloma in her father; Other in her brother; Other (age of onset: 72) in her sister.    ROS:  Please see the history of present illness.   Otherwise, review of systems are positive for back pain.   All other systems are reviewed and negative.    PHYSICAL EXAM: VS:   Ht 5' 6.5" (1.689 m)   LMP  (LMP Unknown)   BMI 30.05 kg/m  , BMI Body mass index is 30.05 kg/m. GEN: Well nourished, well developed, in no acute distress  HEENT: normal  Neck: no JVD, carotid bruits, or masses Cardiac: RRR; no murmurs, rubs, or gallops,no edema  Respiratory:  clear to auscultation bilaterally, normal work of breathing GI: soft, nontender, nondistended, + BS MS: no deformity or atrophy ; 2+ radial pulses bilaterally Skin: warm and dry, no rash Neuro:  Strength and sensation are intact Psych: euthymic mood, full affect   EKG:   The ekg ordered today demonstrates NSR, no ST changes   Recent Labs: No results found for requested labs within last 8760 hours.   Lipid Panel No results found for: CHOL, TRIG, HDL, CHOLHDL, VLDL, LDLCALC, LDLDIRECT   Other studies Reviewed: Additional studies/ records that were reviewed today with results demonstrating: LDL 91 in 2021.  Liver tests normal in 2021.   ASSESSMENT AND PLAN:  1. Coronary calficiation: Continue aggressive secondary prevention.  No angina.  For heart health we would recommend more the whole food, plant-based diet.  Avoiding processed foods, prepackaged foods with added salt and sugar.  She continues with a keto diet.  2. Hyperlipidemia: Lipids were checked with her PMD.  Whole food, plant-based diet recommended.  Avoid processed foods.  She has been maintaining keto, but occasional lapses. Limiting to 2 meals a day.  She does eat eggs, meat and cheese regularly. Increase fiber intake. Avoids bread. 3. HTN: Low-salt diet.  The current medical regimen is effective;  continue present plan and medications. 4. PAD: Continue statin.  The current medical regimen is effective;  continue present plan and medications. 5. Subclavian steal: Known subclavian disease on the right.  NO dizziness with using her arms.  6. LE edema: Elevate legs.  Can consider compression stockings.   Current medicines are reviewed at length  with the patient today.  The patient concerns regarding her medicines were addressed.  The following changes have been made:  No change  Labs/ tests ordered today include:  No orders of the defined types were placed in this encounter.   Recommend 150 minutes/week of aerobic exercise Low fat, low carb, high fiber diet recommended  Disposition:   FU in 1 year   Signed, Larae Grooms, MD  08/06/2020 10:10 AM    [facility] [place], [place],   [address] Phone: [phone]; Fax: [phone]

## 2020-08-06 ENCOUNTER — Other Ambulatory Visit: Payer: Self-pay

## 2020-08-06 ENCOUNTER — Encounter: Payer: Self-pay | Admitting: Interventional Cardiology

## 2020-08-06 ENCOUNTER — Ambulatory Visit (INDEPENDENT_AMBULATORY_CARE_PROVIDER_SITE_OTHER): Payer: Medicare Other | Admitting: Interventional Cardiology

## 2020-08-06 VITALS — BP 110/64 | HR 86 | Ht 66.5 in | Wt 189.0 lb

## 2020-08-06 DIAGNOSIS — I739 Peripheral vascular disease, unspecified: Secondary | ICD-10-CM

## 2020-08-06 DIAGNOSIS — I10 Essential (primary) hypertension: Secondary | ICD-10-CM

## 2020-08-06 DIAGNOSIS — E785 Hyperlipidemia, unspecified: Secondary | ICD-10-CM | POA: Diagnosis not present

## 2020-08-06 DIAGNOSIS — I2584 Coronary atherosclerosis due to calcified coronary lesion: Secondary | ICD-10-CM

## 2020-08-06 DIAGNOSIS — R600 Localized edema: Secondary | ICD-10-CM

## 2020-08-06 DIAGNOSIS — I2510 Atherosclerotic heart disease of native coronary artery without angina pectoris: Secondary | ICD-10-CM | POA: Diagnosis not present

## 2020-08-06 DIAGNOSIS — I771 Stricture of artery: Secondary | ICD-10-CM

## 2020-08-06 NOTE — Patient Instructions (Signed)
Medication Instructions:  Your physician recommends that you continue on your current medications as directed. Please refer to the Current Medication list given to you today.  *If you need a refill on your cardiac medications before your next appointment, please call your pharmacy*   Lab Work: none If you have labs (blood work) drawn today and your tests are completely normal, you will receive your results only by: . MyChart Message (if you have MyChart) OR . A paper copy in the mail If you have any lab test that is abnormal or we need to change your treatment, we will call you to review the results.   Testing/Procedures: none   Follow-Up: At [facility], you and your health needs are our priority.  As part of our continuing mission to provide you with exceptional heart care, we have created designated Provider Care Teams.  These Care Teams include your primary Cardiologist (physician) and Advanced Practice Providers (APPs -  Physician Assistants and Nurse Practitioners) who all work together to provide you with the care you need, when you need it.  We recommend signing up for the patient portal called "MyChart".  Sign up information is provided on this After Visit Summary.  MyChart is used to connect with patients for Virtual Visits (Telemedicine).  Patients are able to view lab/test results, encounter notes, upcoming appointments, etc.  Non-urgent messages can be sent to your provider as well.   To learn more about what you can do with MyChart, go to [email or web address].    Your next appointment:   12 month(s)  The format for your next appointment:   In Person  Provider:   You may see Jayadeep Varanasi, MD or one of the following Advanced Practice Providers on your designated Care Team:    Dayna Dunn, PA-C  Michele Lenze, PA-C    Other Instructions  High-Fiber Eating Plan Fiber, also called dietary fiber, is a type of carbohydrate. It is found foods such as fruits,  vegetables, whole grains, and beans. A high-fiber diet can have many health benefits. Your health care provider may recommend a high-fiber diet to help:  Prevent constipation. Fiber can make your bowel movements more regular.  Lower your cholesterol.  Relieve the following conditions: ? Inflammation of veins in the anus (hemorrhoids). ? Inflammation of specific areas of the digestive tract (uncomplicated diverticulosis). ? A problem of the large intestine, also called the colon, that sometimes causes pain and diarrhea (irritable bowel syndrome, or IBS).  Prevent overeating as part of a weight-loss plan.  Prevent heart disease, type 2 diabetes, and certain cancers. What are tips for following this plan? Reading food labels  Check the nutrition facts label on food products for the amount of dietary fiber. Choose foods that have 5 grams of fiber or more per serving.  The goals for recommended daily fiber intake include: ? Men (age 50 or younger): 34-38 g. ? Men (over age 50): 28-34 g. ? Women (age 50 or younger): 25-28 g. ? Women (over age 50): 22-25 g. Your daily fiber goal is _____________ g.   Shopping  Choose whole fruits and vegetables instead of processed forms, such as apple juice or applesauce.  Choose a wide variety of high-fiber foods such as avocados, lentils, oats, and kidney beans.  Read the nutrition facts label of the foods you choose. Be aware of foods with added fiber. These foods often have high sugar and sodium amounts per serving. Cooking  Use whole-grain flour for baking and cooking.    Cook with brown rice instead of white rice. Meal planning  Start the day with a breakfast that is high in fiber, such as a cereal that contains 5 g of fiber or more per serving.  Eat breads and cereals that are made with whole-grain flour instead of refined flour or white flour.  Eat brown rice, bulgur wheat, or millet instead of white rice.  Use beans in place of meat in  soups, salads, and pasta dishes.  Be sure that half of the grains you eat each day are whole grains. General information  You can get the recommended daily intake of dietary fiber by: ? Eating a variety of fruits, vegetables, grains, nuts, and beans. ? Taking a fiber supplement if you are not able to take in enough fiber in your diet. It is better to get fiber through food than from a supplement.  Gradually increase how much fiber you consume. If you increase your intake of dietary fiber too quickly, you may have bloating, cramping, or gas.  Drink plenty of water to help you digest fiber.  Choose high-fiber snacks, such as berries, raw vegetables, nuts, and popcorn. What foods should I eat? Fruits Berries. Pears. Apples. Oranges. Avocado. Prunes and raisins. Dried figs. Vegetables Sweet potatoes. Spinach. Kale. Artichokes. Cabbage. Broccoli. Cauliflower. Green peas. Carrots. Squash. Grains Whole-grain breads. Multigrain cereal. Oats and oatmeal. Brown rice. Barley. Bulgur wheat. Millet. Quinoa. Bran muffins. Popcorn. Rye wafer crackers. Meats and other proteins Navy beans, kidney beans, and pinto beans. Soybeans. Split peas. Lentils. Nuts and seeds. Dairy Fiber-fortified yogurt. Beverages Fiber-fortified soy milk. Fiber-fortified orange juice. Other foods Fiber bars. The items listed above may not be a complete list of recommended foods and beverages. Contact a dietitian for more information. What foods should I avoid? Fruits Fruit juice. Cooked, strained fruit. Vegetables Fried potatoes. Canned vegetables. Well-cooked vegetables. Grains White bread. Pasta made with refined flour. White rice. Meats and other proteins Fatty cuts of meat. Fried chicken or fried fish. Dairy Milk. Yogurt. Cream cheese. Sour cream. Fats and oils Butters. Beverages Soft drinks. Other foods Cakes and pastries. The items listed above may not be a complete list of foods and beverages to avoid.  Talk with your dietitian about what choices are best for you. Summary  Fiber is a type of carbohydrate. It is found in foods such as fruits, vegetables, whole grains, and beans.  A high-fiber diet has many benefits. It can help to prevent constipation, lower blood cholesterol, aid weight loss, and reduce your risk of heart disease, diabetes, and certain cancers.  Increase your intake of fiber gradually. Increasing fiber too quickly may cause cramping, bloating, and gas. Drink plenty of water while you increase the amount of fiber you consume.  The best sources of fiber include whole fruits and vegetables, whole grains, nuts, seeds, and beans. This information is not intended to replace advice given to you by your health care provider. Make sure you discuss any questions you have with your health care provider. Document Revised: 09/26/2019 Document Reviewed: 09/26/2019 Elsevier Patient Education  2021 Elsevier Inc.   

## 2020-08-17 ENCOUNTER — Encounter: Payer: Self-pay | Admitting: Physical Therapy

## 2020-08-17 ENCOUNTER — Other Ambulatory Visit: Payer: Self-pay

## 2020-08-17 ENCOUNTER — Ambulatory Visit (INDEPENDENT_AMBULATORY_CARE_PROVIDER_SITE_OTHER): Payer: Medicare Other | Admitting: Physical Therapy

## 2020-08-17 DIAGNOSIS — R2681 Unsteadiness on feet: Secondary | ICD-10-CM | POA: Diagnosis not present

## 2020-08-17 DIAGNOSIS — R296 Repeated falls: Secondary | ICD-10-CM

## 2020-08-17 DIAGNOSIS — R2689 Other abnormalities of gait and mobility: Secondary | ICD-10-CM

## 2020-08-17 NOTE — Therapy (Signed)
[facility] [address] [place], [place], [address] Phone: [phone]   Fax:  [phone]  Physical Therapy Evaluation  Patient Details  Name: Shelly [name] MRN: [id number] Date of Birth: May 05, 1951 Referring Provider (PT): Meridee Score, MD   Encounter Date: 08/17/2020   PT End of Session - 08/17/20 1220    Visit Number 1    Number of Visits 5    Date for PT Re-Evaluation 09/17/20    Authorization Type Medicare A&B, BCBS supp    PT Start Time 0930    PT Stop Time 1015    PT Time Calculation (min) 45 min    Activity Tolerance Patient tolerated treatment well;Patient limited by pain    Behavior During Therapy [facility] for tasks assessed/performed           Past Medical History:  Diagnosis Date  . Anxiety   . Arthritis   . Asthma   . Avascular necrosis of talus ([place])   . Cancer ([place])    vulva pre cancer had surgery  . Depression   . Gait abnormality 03/20/2017  . GERD (gastroesophageal reflux disease)   . Hearing loss    "very minor"  . History of pneumonia   . Hyperlipidemia   . Hypertension   . Leg edema    left  . Memory difficulty 03/20/2017  . PONV (postoperative nausea and vomiting)   . Stress incontinence     Past Surgical History:  Procedure Laterality Date  . ANKLE FUSION  2011   right multiple   . ANKLE FUSION     rear ankle fusion  . ANKLE SURGERY  2010   right cordicompression  . AORTIC ARCH ANGIOGRAPHY N/A 12/11/2017   Procedure: AORTIC ARCH ANGIOGRAPHY;  Surgeon: Elam Dutch, MD;  Location: [place] [facility];  Service: Cardiovascular;  Laterality: N/A;  . APPENDECTOMY  05/10/2016  . BELOW KNEE LEG AMPUTATION    . CATARACT EXTRACTION W/ INTRAOCULAR LENS IMPLANT Left   . LAPAROSCOPIC APPENDECTOMY N/A 05/10/2016   Procedure: APPENDECTOMY LAPAROSCOPIC;  Surgeon: Coralie Keens, MD;  Location: [place];  Service: General;  Laterality: N/A;  . LUMBAR LAMINECTOMY/DECOMPRESSION MICRODISCECTOMY N/A 09/09/2015    Procedure:  L4-S1 Decompression/ Discetomy;  Surgeon: Melina Schools, MD;  Location: [place];  Service: Orthopedics;  Laterality: N/A;  . SKIN GRAFT    . SPINAL CORD STIMULATOR INSERTION N/A 01/10/2019   Procedure: LUMBAR SPINAL CORD STIMULATOR INSERTION;  Surgeon: Melina Schools, MD;  Location: [place];  Service: Orthopedics;  Laterality: N/A;  2.5 hrs  . TONSILLECTOMY    . TUBAL LIGATION  1983  . VULVECTOMY N/A 01/28/2015   Procedure: WIDE EXCISION VULVECTOMY;  Surgeon: Thurnell Lose, MD;  Location: [facility];  Service: Gynecology;  Laterality: N/A;    There were no vitals filed for this visit.    Subjective Assessment - 08/17/20 0930    Subjective This 70yo female was referred to PT by Meridee Score, MD with [phone] (ICD-10-CM) - History of right below knee amputation. She underwent a Transtibial Amputation due avascular necrosis on Talus. She received her new prosthesis on 07/09/2020 with Blatchford Elan microprocessor ankle/foot.    Pertinent History Rt TTA 02/17/2012, L4-S1 discectomy 2017, lumbar spinal stimulator 01/10/2019, TIA 2018 (CAT scan showed several TIAs), HTN,    Patient Stated Goals To maximize use of new prosthetic foot, check height    Currently in Pain? Yes    Pain Score 4     Pain Location Hip  Pain Orientation Left;Right   L>R   Pain Type Chronic pain    Pain Onset 1 to 4 weeks ago    Pain Frequency Intermittent    Aggravating Factors  standing & walking too much    Pain Relieving Factors sitting    Effect of Pain on Daily Activities limited time & distance              [facility] PT Assessment - 08/17/20 0930      Assessment   Medical Diagnosis Right Transtibial Amputation    Referring Provider (PT) Meridee Score, MD    Onset Date/Surgical Date 07/09/20   new prosthesis delivery   Hand Dominance Right      Precautions   Precautions Fall      Balance Screen   Has the patient fallen in the past 6 months Yes    How many times? 10   poor fit of old prosthesis (too large  of socket due to 40# weight loss),  injuries C5 pinched nerve, cuts & bruises   Has the patient had a decrease in activity level because of a fear of falling?  No    Is the patient reluctant to leave their home because of a fear of falling?  No      Home Environment   Living Environment Private residence    Living Arrangements Alone    Type of [place] Access Stairs to enter    Entrance Stairs-Number of Steps 2 single steps    Entrance Stairs-Rails None    Home Layout Two level   stair lift   Alternate Level Stairs-Number of Steps 14    Alternate Level Stairs-Rails Left    Home Equipment Walker - 2 wheels;Cane - single point;Wheelchair - manual;Tub bench;Grab bars - tub/shower      Prior Function   Level of Independence Independent;Independent with household mobility without device;Independent with community mobility without device    Vocation Retired    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    Ambulation Distance (Feet) 500 Feet    Assistive device Prosthesis    Gait Pattern Step-through pattern;Decreased arm swing - right;Decreased step length - left;Decreased stance time - right;Left flexed knee in stance;Trunk flexed    Ambulation Surface Level;Indoor    Gait velocity 3.29 ft/sec comfortable & 5.08 ft/sec fast    Stairs Yes    Stairs Assistance 6: Modified independent (Device/Increase time)    Stair Management Technique One rail Left;Alternating  pattern;Forwards    Number of Stairs 11    Height of Stairs 6    Ramp 6: Modified independent (Device)   prosthesis only     Standardized Balance Assessment   Standardized Balance Assessment Berg Balance Test;Timed Up and Go Test      Berg Balance Test   Sit to Stand Able to stand without using hands and stabilize independently  Standing Unsupported Able to stand safely 2 minutes    Sitting with Back Unsupported but Feet Supported on Floor or Stool Able to sit safely and securely 2 minutes    Stand to Sit Sits safely with minimal use of hands    Transfers Able to transfer safely, minor use of hands    Standing Unsupported with Eyes Closed Able to stand 10 seconds safely    Standing Unsupported with Feet Together Able to place feet together independently and stand 1 minute safely    From Standing, Reach Forward with Outstretched Arm Can reach confidently >25 cm (10")    From Standing Position, Pick up Object from Floor Able to pick up shoe safely and easily    From Standing Position, Turn to Look Behind Over each Shoulder Turn sideways only but maintains balance    Turn 360 Degrees Able to turn 360 degrees safely one side only in 4 seconds or less    Standing Unsupported, Alternately Place Feet on Step/Stool Able to stand independently and safely and complete 8 steps in 20 seconds    Standing Unsupported, One Foot in Front Able to plae foot ahead of the other independently and hold 30 seconds    Standing on One Leg Tries to lift leg/unable to hold 3 seconds but remains standing independently    Total Score 49    Berg comment: 46-51 moderate (>50%)      Timed Up and Go Test   Normal TUG (seconds) 9.32    Cognitive TUG (seconds) 12.06      Functional Gait  Assessment   Gait assessed  Yes    Gait Level Surface Walks 20 ft in less than 7 sec but greater than 5.5 sec, uses assistive device, slower speed, mild gait deviations, or deviates 6-10 in outside of the 12 in walkway width.     Change in Gait Speed Able to change speed, demonstrates mild gait deviations, deviates 6-10 in outside of the 12 in walkway width, or no gait deviations, unable to achieve a major change in velocity, or uses a change in velocity, or uses an assistive device.    Gait with Horizontal Head Turns Performs head turns smoothly with slight change in gait velocity (eg, minor disruption to smooth gait path), deviates 6-10 in outside 12 in walkway width, or uses an assistive device.    Gait with Vertical Head Turns Performs task with slight change in gait velocity (eg, minor disruption to smooth gait path), deviates 6 - 10 in outside 12 in walkway width or uses assistive device    Gait and Pivot Turn Pivot turns safely in greater than 3 sec and stops with no loss of balance, or pivot turns safely within 3 sec and stops with mild imbalance, requires small steps to catch balance.    Step Over Obstacle Is able to step over one shoe box (4.5 in total height) without changing gait speed. No evidence of imbalance.    Gait with Narrow Base of Support Ambulates less than 4 steps heel to toe or cannot perform without assistance.    Gait with Eyes Closed Walks 20 ft, uses assistive device, slower speed, mild gait deviations, deviates 6-10 in outside 12 in walkway width. Ambulates 20 ft in less than 9 sec but greater than 7 sec.    Ambulating Backwards Walks 20 ft, uses assistive device, slower speed, mild gait deviations, deviates 6-10 in outside 12 in walkway width.    Steps Alternating feet, must use rail.  Total Score 18           Prosthetics Assessment - 08/17/20 0930      Prosthetics   Prosthetic Care Independent with Skin check;Residual limb care;Care of non-amputated limb;Prosthetic cleaning;Ply sock cleaning;Correct ply sock adjustment;Proper wear schedule/adjustment;Proper weight-bearing schedule/adjustment    Donning prosthesis  Independent    Doffing prosthesis  Independent    Current prosthetic wear  tolerance (days/week)  daily    Current prosthetic wear tolerance (#hours/day)  most of awake hours    Current prosthetic weight-bearing tolerance (hours/day)  Pt reports bilateral (L>R) hip pain after 5 min of gait    Residual limb condition  Pt reports no issues.    Prosthesis Description silicon liner with pin lock suspension, Blatchford Elan microprocessor ankle/foo    K code/activity level with prosthetic use  K3 full community with variable cadence                     Objective measurements completed on examination: See above findings.                    PT Long Term Goals - 08/17/20 1230      PT LONG TERM GOAL #1   Title Patient demonstrates & verbalizes understanding of ongoing HEP / fitness plan.    Time 4    Period Weeks    Status New    Target Date 09/17/20      PT LONG TERM GOAL #2   Title Functional Gait Assessment >/= 24/30    Time 4    Period Weeks    Status New    Target Date 09/17/20      PT LONG TERM GOAL #3   Title Cognitive TUG increases <2 sec from std TUG indicating less fall risk when distracted.    Time 4    Period Weeks    Status New    Target Date 09/17/20      PT LONG TERM GOAL #4   Title patient reports bilateral hip pain >/= 25% improvement with standing & gait activities.    Time 4    Period Weeks    Status New    Target Date 09/17/20                  Plan - 08/17/20 1223    Clinical Impression Statement This 70yo female has history of right Transtibial Amputation in 2013. She received a new prosthesis with Blatchford Elan microprocessor ankle/foot and is unfamilar with functional differences.  Berg Balance score of 49/56 indicates moderate (50%) fall risk.  Timed Up & Go standard and cognitive are both WNL however increased time for cognitive was significant for fall risk when she is distracted or not focused on her gait. Functional Gait Assessment of 18/30 indicates high fall risk.  Patient would benefit  from short duration of PT to maximize function of new prosthesis and decrease fall risk.    Personal Factors and Comorbidities Comorbidity 3+    Comorbidities Rt TTA 02/17/2012, L4-S1 discectomy 2017, lumbar spinal stimulator 01/10/2019, TIA 2020, HTN,    Examination-Activity Limitations Carry;Lift;Locomotion Level;Stairs;Stand;Transfers    Examination-Participation Restrictions Church;Community Activity;Occupation    Stability/Clinical Decision Making Evolving/Moderate complexity    Clinical Decision Making Moderate    Rehab Potential Good    PT Frequency 1x / week    PT Duration 4 weeks    PT Treatment/Interventions ADLs/Self Care Home Management;Electrical Stimulation;Gait training;Stair training;Functional mobility training;Therapeutic activities;Therapeutic exercise;Balance training;DME Instruction;Neuromuscular re-education;Patient/family  education;Prosthetic Training;Manual techniques;Passive range of motion;Dry needling;Taping;Vestibular    PT Next Visit Plan instruct in HEP to include strength, flexibility, balance & endurance    Consulted and Agree with Plan of Care Patient           Patient will benefit from skilled therapeutic intervention in order to improve the following deficits and impairments:  Abnormal gait,Decreased activity tolerance,Decreased balance,Decreased mobility,Decreased skin integrity,Impaired flexibility,Postural dysfunction,Prosthetic Dependency,Pain  Visit Diagnosis: Unsteadiness on feet  Other abnormalities of gait and mobility  Repeated falls     Problem List Patient Active Problem List   Diagnosis Date Noted  . Upper airway cough syndrome 03/26/2019  . Chronic pain 01/10/2019  . MCI (mild cognitive impairment) 03/20/2017  . Gait abnormality 03/20/2017  . History of right below knee amputation ([place]) 07/11/2016  . Adenomatous colon polyp 05/10/2016  . Coronary artery calcification 01/07/2016  . Essential hypertension 01/07/2016  . Back pain  09/09/2015  . Abnormal CXR 05/16/2014  . Cough variant asthma ? assoc with [facility] 05/15/2014  . Acute post-operative pain 02/18/2012  . Adiposity 02/17/2012  . Does use eyeglasses 02/13/2012  . H/O arthrodesis 10/21/2011  . Fibromyalgia 04/11/2011  . HLD (hyperlipidemia) 04/11/2011  . Leg edema 09/08/2010    Jamey Reas, PT, DPT 08/17/2020, 12:33 PM  [facility] [address] [place], [place], [address] Phone: [phone]   Fax:  [phone]  Name: Shelly [name] MRN: [id number] Date of Birth: 1951-04-22

## 2020-08-20 DIAGNOSIS — M5412 Radiculopathy, cervical region: Secondary | ICD-10-CM | POA: Diagnosis not present

## 2020-08-25 ENCOUNTER — Ambulatory Visit (INDEPENDENT_AMBULATORY_CARE_PROVIDER_SITE_OTHER): Payer: Medicare Other | Admitting: Physical Therapy

## 2020-08-25 ENCOUNTER — Other Ambulatory Visit: Payer: Self-pay

## 2020-08-25 ENCOUNTER — Encounter: Payer: Self-pay | Admitting: Physical Therapy

## 2020-08-25 DIAGNOSIS — R2689 Other abnormalities of gait and mobility: Secondary | ICD-10-CM | POA: Diagnosis not present

## 2020-08-25 DIAGNOSIS — R2681 Unsteadiness on feet: Secondary | ICD-10-CM

## 2020-08-25 DIAGNOSIS — R296 Repeated falls: Secondary | ICD-10-CM

## 2020-08-25 NOTE — Patient Instructions (Addendum)
Fitness Plan has 4 components.  1. Endurance - Goal is 20-30 minutes. You can break time up between machines. You can do sets with rest between sets if need. Example 5 minutes work, 2 minutes rest for 3 sets. Recommend machines that are sitting with back support that uses both your arms and your legs at the same time. Or can do walking program & can use assistive device if increases time & quality of your walking.  Treadmill safety- step on & off machine with foot on solid portion not treadmill belt. Use safety lead so belt will stop if you get in trouble. Straddle belt. Turn treadmill on & set to desired speed while you are still straddling the belt. Step on & off belt leading with your good leg. Step off the belt first to adjust speed or stop.  2. Strength - Goal is form & control. Weight machines should have enough weight to have resistance but not so much that you have to strain or cheat. If you have to cheat (poor form), strain or the weight stack hits hard / out of control, then you probably have too much weight.   Goal is to do 15 repetitions for 1-3 sets. Start with 1 set and build up. Rest 30-60 seconds between sets.  Do 2-4 leg machines, 2-4 arm machines & 2-4 trunk machines. Build up to 4-6 leg machines & 4-6 arm machines. Look for pictures to make sure you exercise both sides of arm, leg or trunk. You want to balance out muscle groups that you working.   Leg machines like leg press (can do both legs or each leg by themselves),   At home can do theraband exercises for arms or legs, floor transfers, sit to stand to sit using arms as little as possible, Yoga positions 3.Flexibility - make sure you include arms, legs & trunk. Can do Yoga also 4. Balance- can work in corner with chair in front - head turns, arm motions, eyes closed; place one foot inside cabinet or on 4-6" block to work on one-legged stance,   Try to get to each component 3-5 times per week.  Going to [facility] or fitness center you  want do things you can not do at home.  For example use bikes at [facility] if you don't have one at home. Then on days you don't go to [facility], do exercises at home. Having 2 or more programs like one program for [facility] & one program for home / days that you do not go to [facility].   Recommendations are at least 150 minutes a week of moderate intensity exercise. Try not to go more than 2 days in a row without being active for at least 30 minutes a day. Any activity where you are up and moving is good- Walking, bicycling, stationary bicycling, dancing.  (moderate intensity means to get  a little out of breath). To start you may not tolerate moderate but if it is challenging to you then it is helping.    Do resistance exercise at least 2 times a week.  This can be yoga poses or strength training where you lift your own weight (think leg lifts or toe raises)  or light weights like cans of beans with your arms.    Flexibility and balance exercise- safe stretching and practicing balance is very important to health.   Access Code: [email or web address] URL: [email or web address] Date: 08/25/2020 Prepared by: Jamey Reas  Exercises Upright Stance at Door Frame Single Arm - 1-3  x daily - 7 x weekly - 1 sets - 2 reps - 2 deep breathes hold Upright Stance at Door Frame with Both Arms - 1-3 x daily - 7 x weekly - 1 sets - 2 reps - 2 deep breathes hold Seated Hamstring Stretch - 2 x daily - 7 x weekly - 3 reps - 1 sets - 30 seconds hold Seated Gastroc Stretch with Strap - 2 x daily - 7 x weekly - 3 reps - 1 sets - 30 seconds hold Seated Hamstring Stretch with Strap - 2 x daily - 7 x weekly - 3 reps - 1 sets - 30 seconds hold Standing Hip Flexion with Resistance - 1 x daily - 7 x weekly - 10 reps - 1 sets Standing Hip Extension with Resistance - 1 x daily - 7 x weekly - 10 reps - 1 sets Standing Hip Adduction with Resistance - 1 x daily - 7 x weekly - 10 reps - 1 sets Standing Hip Abduction with Theraband  Resistance - 1 x daily - 7 x weekly - 10 reps - 1 sets Alternating Punch with Resistance - 1 x daily - 5 x weekly - 10 reps - 1 sets - 5 seconds hold Standing Scapular Protraction with Resistance - 1 x daily - 7 x weekly - 10 reps - 1 sets - 5 seconds hold Standing alternate rows with resistance - 1 x daily - 7 x weekly - 10 reps - 1 sets - 5 seconds hold Standing Row with Anchored Resistance - 1 x daily - 7 x weekly - 10 reps - 1 sets - 5 seconds hold Alternating elbow flexion with resistance - 1 x daily - 7 x weekly - 10 reps - 1 sets - 5 seconds hold Standing Bicep Curls with Resistance - 1 x daily - 7 x weekly - 10 reps - 1 sets - 5 seconds hold wide stance head motions eyes open - 1 x daily - 4 x weekly - 10 reps - 1 sets - 2 seconds hold Feet Apart with Eyes Closed with Head Motions - 1 x daily - 4 x weekly - 10 reps - 1 sets - 2 seconds hold Wide stance on Foam Pad head movements - 1 x daily - 4 x weekly - 10 reps - 1 sets - 2 seconds hold

## 2020-08-25 NOTE — Therapy (Signed)
[facility] [address] [place], [place], [address] Phone: [phone]   Fax:  [phone]  Physical Therapy Treatment  Patient Details  Name: Shelly [name] MRN: [id number] Date of Birth: 09/04/1950 Referring Provider (PT): Meridee Score, MD   Encounter Date: 08/25/2020   PT End of Session - 08/25/20 0829    Visit Number 2    Number of Visits 5    Date for PT Re-Evaluation 09/17/20    Authorization Type Medicare A&B, BCBS supp    PT Start Time 0810    PT Stop Time 0905    PT Time Calculation (min) 55 min    Activity Tolerance Patient tolerated treatment well;Patient limited by pain    Behavior During Therapy [facility] for tasks assessed/performed           Past Medical History:  Diagnosis Date  . Anxiety   . Arthritis   . Asthma   . Avascular necrosis of talus ([place])   . Cancer ([place])    vulva pre cancer had surgery  . Depression   . Gait abnormality 03/20/2017  . GERD (gastroesophageal reflux disease)   . Hearing loss    "very minor"  . History of pneumonia   . Hyperlipidemia   . Hypertension   . Leg edema    left  . Memory difficulty 03/20/2017  . PONV (postoperative nausea and vomiting)   . Stress incontinence     Past Surgical History:  Procedure Laterality Date  . ANKLE FUSION  2011   right multiple   . ANKLE FUSION     rear ankle fusion  . ANKLE SURGERY  2010   right cordicompression  . AORTIC ARCH ANGIOGRAPHY N/A 12/11/2017   Procedure: AORTIC ARCH ANGIOGRAPHY;  Surgeon: Elam Dutch, MD;  Location: [facility];  Service: Cardiovascular;  Laterality: N/A;  . APPENDECTOMY  05/10/2016  . BELOW KNEE LEG AMPUTATION    . CATARACT EXTRACTION W/ INTRAOCULAR LENS IMPLANT Left   . LAPAROSCOPIC APPENDECTOMY N/A 05/10/2016   Procedure: APPENDECTOMY LAPAROSCOPIC;  Surgeon: Coralie Keens, MD;  Location: [facility];  Service: General;  Laterality: N/A;  . LUMBAR LAMINECTOMY/DECOMPRESSION MICRODISCECTOMY N/A 09/09/2015    Procedure:  L4-S1 Decompression/ Discetomy;  Surgeon: Melina Schools, MD;  Location: [facility];  Service: Orthopedics;  Laterality: N/A;  . SKIN GRAFT    . SPINAL CORD STIMULATOR INSERTION N/A 01/10/2019   Procedure: LUMBAR SPINAL CORD STIMULATOR INSERTION;  Surgeon: Melina Schools, MD;  Location: [facility];  Service: Orthopedics;  Laterality: N/A;  2.5 hrs  . TONSILLECTOMY    . TUBAL LIGATION  1983  . VULVECTOMY N/A 01/28/2015   Procedure: WIDE EXCISION VULVECTOMY;  Surgeon: Thurnell Lose, MD;  Location: [facility];  Service: Gynecology;  Laterality: N/A;    There were no vitals filed for this visit.   Subjective Assessment - 08/25/20 0812    Subjective She had shot for C5 issues and spinal stimulator turned back on.  No falls. Her back, hips & lateral thigh hurts after walking.    Currently in Pain? Yes    Pain Score 4    since PT eval, ranged from 2/10 -10/10   Pain Location Hip    Pain Orientation Right;Left;Lateral    Pain Descriptors / Indicators Aching    Pain Type Chronic pain    Pain Onset More than a month ago    Pain Frequency Intermittent    Aggravating Factors  walking longer distances like dog    Pain Relieving  Factors sitting to rest,           Fitness Plan has 4 components.  1. Endurance - Goal is 20-30 minutes. You can break time up between machines. You can do sets with rest between sets if need. Example 5 minutes work, 2 minutes rest for 3 sets. Recommend machines that are sitting with back support that uses both your arms and your legs at the same time. Or can do walking program & can use assistive device if increases time & quality of your walking.  Treadmill safety- step on & off machine with foot on solid portion not treadmill belt. Use safety lead so belt will stop if you get in trouble. Straddle belt. Turn treadmill on & set to desired speed while you are still straddling the belt. Step on & off belt leading with your good leg. Step off the belt first to adjust speed or stop.   2. Strength - Goal is form & control. Weight machines should have enough weight to have resistance but not so much that you have to strain or cheat. If you have to cheat (poor form), strain or the weight stack hits hard / out of control, then you probably have too much weight.   Goal is to do 15 repetitions for 1-3 sets. Start with 1 set and build up. Rest 30-60 seconds between sets.  Do 2-4 leg machines, 2-4 arm machines & 2-4 trunk machines. Build up to 4-6 leg machines & 4-6 arm machines. Look for pictures to make sure you exercise both sides of arm, leg or trunk. You want to balance out muscle groups that you working.   Leg machines like leg press (can do both legs or each leg by themselves),   At home can do theraband exercises for arms or legs, floor transfers, sit to stand to sit using arms as little as possible, Yoga positions 3.Flexibility - make sure you include arms, legs & trunk. Can do Yoga also 4. Balance- can work in corner with chair in front - head turns, arm motions, eyes closed; place one foot inside cabinet or on 4-6" block to work on one-legged stance,   Try to get to each component 3-5 times per week.  Going to [facility] or fitness center you want do things you can not do at home.  For example use bikes at [facility] if you don't have one at home. Then on days you don't go to [facility], do exercises at home. Having 2 or more programs like one program for [facility] & one program for home / days that you do not go to [facility].   Recommendations are at least 150 minutes a week of moderate intensity exercise. Try not to go more than 2 days in a row without being active for at least 30 minutes a day. Any activity where you are up and moving is good- Walking, bicycling, stationary bicycling, dancing.  (moderate intensity means to get  a little out of breath). To start you may not tolerate moderate but if it is challenging to you then it is helping.    Do resistance exercise at least 2 times a week.  This  can be yoga poses or strength training where you lift your own weight (think leg lifts or toe raises)  or light weights like cans of beans with your arms.    Flexibility and balance exercise- safe stretching and practicing balance is very important to health.   Access Code: [email or web address] URL: [email or web address] Date: 08/25/2020  Prepared by: Jamey Reas  Exercises Upright Stance at Door Frame Single Arm - 1-3 x daily - 7 x weekly - 1 sets - 2 reps - 2 deep breathes hold Upright Stance at Door Frame with Both Arms - 1-3 x daily - 7 x weekly - 1 sets - 2 reps - 2 deep breathes hold Seated Hamstring Stretch - 2 x daily - 7 x weekly - 3 reps - 1 sets - 30 seconds hold Seated Gastroc Stretch with Strap - 2 x daily - 7 x weekly - 3 reps - 1 sets - 30 seconds hold Seated Hamstring Stretch with Strap - 2 x daily - 7 x weekly - 3 reps - 1 sets - 30 seconds hold Standing Hip Flexion with Resistance - 1 x daily - 7 x weekly - 10 reps - 1 sets Standing Hip Extension with Resistance - 1 x daily - 7 x weekly - 10 reps - 1 sets Standing Hip Adduction with Resistance - 1 x daily - 7 x weekly - 10 reps - 1 sets Standing Hip Abduction with Theraband Resistance - 1 x daily - 7 x weekly - 10 reps - 1 sets Alternating Punch with Resistance - 1 x daily - 5 x weekly - 10 reps - 1 sets - 5 seconds hold Standing Scapular Protraction with Resistance - 1 x daily - 7 x weekly - 10 reps - 1 sets - 5 seconds hold Standing alternate rows with resistance - 1 x daily - 7 x weekly - 10 reps - 1 sets - 5 seconds hold Standing Row with Anchored Resistance - 1 x daily - 7 x weekly - 10 reps - 1 sets - 5 seconds hold Alternating elbow flexion with resistance - 1 x daily - 7 x weekly - 10 reps - 1 sets - 5 seconds hold Standing Bicep Curls with Resistance - 1 x daily - 7 x weekly - 10 reps - 1 sets - 5 seconds hold wide stance head motions eyes open - 1 x daily - 4 x weekly - 10 reps - 1 sets - 2 seconds hold Feet  Apart with Eyes Closed with Head Motions - 1 x daily - 4 x weekly - 10 reps - 1 sets - 2 seconds hold Wide stance on Foam Pad head movements - 1 x daily - 4 x weekly - 10 reps - 1 sets - 2 seconds hold    PT discussed endurance options with inclination towards recumbent seated with back support machines.  PT recommended looking into options from [employer] to [place] to purchase something for home.  [facility] also has classes that she may benefit from attending. Pt verbalized understanding.                        PT Education - 08/25/20 [phone]    Education Details components to well-rounded fitness plan - see pt instructions & Access Code: [email or web address]    Person(s) Educated Patient    Methods Explanation;Demonstration;Tactile cues;Verbal cues;Handout    Comprehension Verbalized understanding;Returned demonstration;Verbal cues required;Tactile cues required               PT Long Term Goals - 08/17/20 1230      PT LONG TERM GOAL #1   Title Patient demonstrates & verbalizes understanding of ongoing HEP / fitness plan.    Time 4    Period Weeks    Status New  Target Date 09/17/20      PT LONG TERM GOAL #2   Title Functional Gait Assessment >/= 24/30    Time 4    Period Weeks    Status New    Target Date 09/17/20      PT LONG TERM GOAL #3   Title Cognitive TUG increases <2 sec from std TUG indicating less fall risk when distracted.    Time 4    Period Weeks    Status New    Target Date 09/17/20      PT LONG TERM GOAL #4   Title patient reports bilateral hip pain >/= 25% improvement with standing & gait activities.    Time 4    Period Weeks    Status New    Target Date 09/17/20                 Plan - 08/25/20 1700    Clinical Impression Statement PT educated patient on components of well-rounded fitness program with recommendation to establish group A for Mon, Wed & Fri and group B for [place] & Sat taking Sunday  off for rest.  PT instructed in HEP for flexibility, strength & balance components and discuss endurance options.  Pt appears to have a general understanding enough to initiate over next week.    Personal Factors and Comorbidities Comorbidity 3+    Comorbidities Rt TTA 02/17/2012, L4-S1 discectomy 2017, lumbar spinal stimulator 01/10/2019, TIA 2020, HTN,    Examination-Activity Limitations Carry;Lift;Locomotion Level;Stairs;Stand;Transfers    Examination-Participation Restrictions Church;Community Activity;Occupation    Stability/Clinical Decision Making Evolving/Moderate complexity    Rehab Potential Good    PT Frequency 1x / week    PT Duration 4 weeks    PT Treatment/Interventions ADLs/Self Care Home Management;Electrical Stimulation;Gait training;Stair training;Functional mobility training;Therapeutic activities;Therapeutic exercise;Balance training;DME Instruction;Neuromuscular re-education;Patient/family education;Prosthetic Training;Manual techniques;Passive range of motion;Dry needling;Taping;Vestibular    PT Next Visit Plan check HEP, add stretches for ITB & piriformis, trial cardio machines leaning towards recumbent,    Consulted and Agree with Plan of Care Patient           Patient will benefit from skilled therapeutic intervention in order to improve the following deficits and impairments:  Abnormal gait,Decreased activity tolerance,Decreased balance,Decreased mobility,Decreased skin integrity,Impaired flexibility,Postural dysfunction,Prosthetic Dependency,Pain  Visit Diagnosis: Other abnormalities of gait and mobility  Repeated falls  Unsteadiness on feet     Problem List Patient Active Problem List   Diagnosis Date Noted  . Upper airway cough syndrome 03/26/2019  . Chronic pain 01/10/2019  . MCI (mild cognitive impairment) 03/20/2017  . Gait abnormality 03/20/2017  . History of right below knee amputation ([place]) 07/11/2016  . Adenomatous colon polyp 05/10/2016  .  Coronary artery calcification 01/07/2016  . Essential hypertension 01/07/2016  . Back pain 09/09/2015  . Abnormal CXR 05/16/2014  . Cough variant asthma ? assoc with [facility] 05/15/2014  . Acute post-operative pain 02/18/2012  . Adiposity 02/17/2012  . Does use eyeglasses 02/13/2012  . H/O arthrodesis 10/21/2011  . Fibromyalgia 04/11/2011  . HLD (hyperlipidemia) 04/11/2011  . Leg edema 09/08/2010    Jamey Reas, PT, DPT 08/25/2020, 9:16 AM  [facility] Therapy [address] [place], [place], [address] Phone: [phone]   Fax:  [phone]  Name: Shelly [name] MRN: [id number] Date of Birth: 1951/05/05

## 2020-09-03 ENCOUNTER — Other Ambulatory Visit: Payer: Self-pay

## 2020-09-03 ENCOUNTER — Encounter: Payer: Self-pay | Admitting: Physical Therapy

## 2020-09-03 ENCOUNTER — Ambulatory Visit (INDEPENDENT_AMBULATORY_CARE_PROVIDER_SITE_OTHER): Payer: Medicare Other | Admitting: Physical Therapy

## 2020-09-03 DIAGNOSIS — R2689 Other abnormalities of gait and mobility: Secondary | ICD-10-CM | POA: Diagnosis not present

## 2020-09-03 DIAGNOSIS — R2681 Unsteadiness on feet: Secondary | ICD-10-CM

## 2020-09-03 DIAGNOSIS — R296 Repeated falls: Secondary | ICD-10-CM

## 2020-09-03 DIAGNOSIS — M5412 Radiculopathy, cervical region: Secondary | ICD-10-CM | POA: Diagnosis not present

## 2020-09-03 DIAGNOSIS — M47816 Spondylosis without myelopathy or radiculopathy, lumbar region: Secondary | ICD-10-CM | POA: Diagnosis not present

## 2020-09-03 NOTE — Therapy (Signed)
[facility] [address] [place], [place], [address] Phone: [phone]   Fax:  [phone]  Physical Therapy Treatment  Patient Details  Name: Shelly [name] MRN: [id number] Date of Birth: [birth year] Referring Provider (PT): Meridee Score, MD   Encounter Date: 09/03/2020   PT End of Session - 09/03/20 0938    Visit Number 3    Number of Visits 5    Date for PT Re-Evaluation 09/17/20    Authorization Type Medicare A&B, BCBS supp    PT Start Time 0931    PT Stop Time 1015    PT Time Calculation (min) 44 min    Activity Tolerance Patient tolerated treatment well;Patient limited by pain    Behavior During Therapy [facility] for tasks assessed/performed           Past Medical History:  Diagnosis Date  . Anxiety   . Arthritis   . Asthma   . Avascular necrosis of talus ([place])   . Cancer ([place])    vulva pre cancer had surgery  . Depression   . Gait abnormality 03/20/2017  . GERD (gastroesophageal reflux disease)   . Hearing loss    "very minor"  . History of pneumonia   . Hyperlipidemia   . Hypertension   . Leg edema    left  . Memory difficulty 03/20/2017  . PONV (postoperative nausea and vomiting)   . Stress incontinence     Past Surgical History:  Procedure Laterality Date  . ANKLE FUSION  2011   right multiple   . ANKLE FUSION     rear ankle fusion  . ANKLE SURGERY  2010   right cordicompression  . AORTIC ARCH ANGIOGRAPHY N/A 12/11/2017   Procedure: AORTIC ARCH ANGIOGRAPHY;  Surgeon: Elam Dutch, MD;  Location: [place] [facility];  Service: Cardiovascular;  Laterality: N/A;  . APPENDECTOMY  05/10/2016  . BELOW KNEE LEG AMPUTATION    . CATARACT EXTRACTION W/ INTRAOCULAR LENS IMPLANT Left   . LAPAROSCOPIC APPENDECTOMY N/A 05/10/2016   Procedure: APPENDECTOMY LAPAROSCOPIC;  Surgeon: Coralie Keens, MD;  Location: [place];  Service: General;  Laterality: N/A;  . LUMBAR LAMINECTOMY/DECOMPRESSION MICRODISCECTOMY N/A 09/09/2015    Procedure:  L4-S1 Decompression/ Discetomy;  Surgeon: Melina Schools, MD;  Location: [place];  Service: Orthopedics;  Laterality: N/A;  . SKIN GRAFT    . SPINAL CORD STIMULATOR INSERTION N/A 01/10/2019   Procedure: LUMBAR SPINAL CORD STIMULATOR INSERTION;  Surgeon: Melina Schools, MD;  Location: [place];  Service: Orthopedics;  Laterality: N/A;  2.5 hrs  . TONSILLECTOMY    . TUBAL LIGATION  1983  . VULVECTOMY N/A 01/28/2015   Procedure: WIDE EXCISION VULVECTOMY;  Surgeon: Thurnell Lose, MD;  Location: [place] [facility];  Service: Gynecology;  Laterality: N/A;    There were no vitals filed for this visit.   Subjective Assessment - 09/03/20 0931    Subjective She has been sort of doing exercises.    Currently in Pain? Yes    Pain Location Back   lower thoracic to sacral   Pain Orientation Mid    Pain Descriptors / Indicators Aching;Other (Comment)   creates weakness   Pain Type Chronic pain    Pain Onset More than a month ago    Pain Frequency Intermittent    Aggravating Factors  walking, bending over, reaching up or down, forward less    Pain Relieving Factors sitting  [place] Adult PT Treatment/Exercise - 09/03/20 0931      Therapeutic Activites    Therapeutic Activities ADL's    ADL's initiate stationary activities with equal weight on LEs. then will shift to preferred position.      Exercises   Exercises Knee/Hip      Knee/Hip Exercises: Aerobic   Recumbent Bike seat 5 level 1 for 10 min  PT educated on set up with TTA prosthesis and cues on frequency & duration as part of her fitness routine.      Knee/Hip Exercises: Machines for Strengthening   Cybex Leg Press shuttle BLEs 75# 15 reps 3 sets      Prosthetics   Prosthetic Care Comments  Pt reports pain at end of day distal lateral & proximal medial which may indicate foot too inset.  PT recommended discussion with prosthetist.  She verbalized understanding.                PT recommended  breaking exercises into at least 3 groups: 1.fitness center program for 2-3 days / wk  2.home exercises like theraband (more intense for her) performing 2-4 of off days to #1   3.stretches & low intensity balance / posture daily.  Pt verbalized understanding.        PT Long Term Goals - 09/03/20 1239      PT LONG TERM GOAL #1   Title Patient demonstrates & verbalizes understanding of ongoing HEP / fitness plan.    Time 4    Period Weeks    Status On-going    Target Date 09/17/20      PT LONG TERM GOAL #2   Title Functional Gait Assessment >/= 24/30    Time 4    Period Weeks    Status On-going    Target Date 09/17/20      PT LONG TERM GOAL #3   Title Cognitive TUG increases <2 sec from std TUG indicating less fall risk when distracted.    Time 4    Period Weeks    Status On-going    Target Date 09/17/20      PT LONG TERM GOAL #4   Title patient reports bilateral hip pain >/= 25% improvement with standing & gait activities.    Time 4    Period Weeks    Status On-going    Target Date 09/17/20                 Plan - 09/03/20 7824    Clinical Impression Statement PT educated patient on community portion of fitness plan and she appears to understand.  PT addressed standing posture habits in relation to back/hip pain.  Patient reports limb pain that may indicate prosthetic foot is too inset.    Personal Factors and Comorbidities Comorbidity 3+    Comorbidities Rt TTA 02/17/2012, L4-S1 discectomy 2017, lumbar spinal stimulator 01/10/2019, TIA 2020, HTN,    Examination-Activity Limitations Carry;Lift;Locomotion Level;Stairs;Stand;Transfers    Examination-Participation Restrictions Church;Community Activity;Occupation    Stability/Clinical Decision Making Evolving/Moderate complexity    Rehab Potential Good    PT Frequency 1x / week    PT Duration 4 weeks    PT Treatment/Interventions ADLs/Self Care Home Management;Electrical Stimulation;Gait training;Stair  training;Functional mobility training;Therapeutic activities;Therapeutic exercise;Balance training;DME Instruction;Neuromuscular re-education;Patient/family education;Prosthetic Training;Manual techniques;Passive range of motion;Dry needling;Taping;Vestibular    PT Next Visit Plan check HEP, add stretches for ITB & piriformis, work towards [employer] and Agree with Plan of Care Patient  Patient will benefit from skilled therapeutic intervention in order to improve the following deficits and impairments:  Abnormal gait,Decreased activity tolerance,Decreased balance,Decreased mobility,Decreased skin integrity,Impaired flexibility,Postural dysfunction,Prosthetic Dependency,Pain  Visit Diagnosis: Other abnormalities of gait and mobility  Repeated falls  Unsteadiness on feet     Problem List Patient Active Problem List   Diagnosis Date Noted  . Upper airway cough syndrome 03/26/2019  . Chronic pain 01/10/2019  . MCI (mild cognitive impairment) 03/20/2017  . Gait abnormality 03/20/2017  . History of right below knee amputation ([place]) 07/11/2016  . Adenomatous colon polyp 05/10/2016  . Coronary artery calcification 01/07/2016  . Essential hypertension 01/07/2016  . Back pain 09/09/2015  . Abnormal CXR 05/16/2014  . Cough variant asthma ? assoc with UACS 05/15/2014  . Acute post-operative pain 02/18/2012  . Adiposity 02/17/2012  . Does use eyeglasses 02/13/2012  . H/O arthrodesis 10/21/2011  . Fibromyalgia 04/11/2011  . HLD (hyperlipidemia) 04/11/2011  . Leg edema 09/08/2010    Jamey Reas, PT, DPT 09/03/2020, 12:42 PM  [facility] [address] [place], [place], [address] Phone: [phone]   Fax:  [phone]  Name: Fidela Cieslak MRN: [id number] Date of Birth: [birth year]

## 2020-09-09 ENCOUNTER — Other Ambulatory Visit: Payer: Self-pay | Admitting: Obstetrics and Gynecology

## 2020-09-09 DIAGNOSIS — Z1231 Encounter for screening mammogram for malignant neoplasm of breast: Secondary | ICD-10-CM

## 2020-09-10 ENCOUNTER — Ambulatory Visit (INDEPENDENT_AMBULATORY_CARE_PROVIDER_SITE_OTHER): Payer: Medicare Other | Admitting: Physical Therapy

## 2020-09-10 ENCOUNTER — Encounter: Payer: Self-pay | Admitting: Physical Therapy

## 2020-09-10 ENCOUNTER — Other Ambulatory Visit: Payer: Self-pay

## 2020-09-10 DIAGNOSIS — R2681 Unsteadiness on feet: Secondary | ICD-10-CM

## 2020-09-10 DIAGNOSIS — R296 Repeated falls: Secondary | ICD-10-CM

## 2020-09-10 DIAGNOSIS — R2689 Other abnormalities of gait and mobility: Secondary | ICD-10-CM | POA: Diagnosis not present

## 2020-09-10 NOTE — Patient Instructions (Addendum)
While walking: 1. Turn to your head to look along 4 lines stopping in middle to glance forward. Right/left, up/down, up-right/down-left diagonal & up-left/down-right diagonal.  2. Picking up pace by taking quicker & longer steps.  Walk normal pace 5 steps then fast 5 steps alternating. 3. Walking in dark or low vision.  Walk 5 steps with eyes open, then 5 steps with eyes closed alternating. 4. Walk Backwards. 5. Near counter or narrow hall. Drunk test walk heel to toe.  6. Pick a category like foods, animals, things on a farm. Walk & name items A-Z.  7. Stepping over obstacle.  Plant good foot (left) close to obstacle and glance down while prosthesis steps over obstacle.  Standing at sink 8. Look over shoulders: turn head first, then shoulder, then shift weight to foot on side that you are looking, then pull hip back. 9. Alternate tapping toes into lower cabinet. 10. Stand heel to toe. Raise hands & count to 50. Pause when touch loose balance. Restart where you left off in count.  Switch leg in front & redo. 11. Place forefoot in lower cabinet. Make sure leg you are standing on is under your hip. Raise hands for 10 seconds. Alternate legs for 5 reps ea leg. 12.  Place a chair back behind you at sink.  Turn clockwise & counter clockwise taking quarter steps.  3 reps ea way.

## 2020-09-10 NOTE — Therapy (Signed)
[facility] [address] [place], [place], [address] Phone: [phone]   Fax:  [phone]  Physical Therapy Treatment  Patient Details  Name: Shelly [name] MRN: [id number] Date of Birth: January 10, 1951 Referring Provider (PT): Meridee Score, MD   Encounter Date: 09/10/2020   PT End of Session - 09/10/20 0930    Visit Number 4    Number of Visits 5    Date for PT Re-Evaluation 09/17/20    Authorization Type Medicare A&B, BCBS supp    PT Start Time 0930    PT Stop Time 1018    PT Time Calculation (min) 48 min    Activity Tolerance Patient tolerated treatment well;Patient limited by pain    Behavior During Therapy [facility] for tasks assessed/performed           Past Medical History:  Diagnosis Date  . Anxiety   . Arthritis   . Asthma   . Avascular necrosis of talus ([place])   . Cancer ([place])    vulva pre cancer had surgery  . Depression   . Gait abnormality 03/20/2017  . GERD (gastroesophageal reflux disease)   . Hearing loss    "very minor"  . History of pneumonia   . Hyperlipidemia   . Hypertension   . Leg edema    left  . Memory difficulty 03/20/2017  . PONV (postoperative nausea and vomiting)   . Stress incontinence     Past Surgical History:  Procedure Laterality Date  . ANKLE FUSION  2011   right multiple   . ANKLE FUSION     rear ankle fusion  . ANKLE SURGERY  2010   right cordicompression  . AORTIC ARCH ANGIOGRAPHY N/A 12/11/2017   Procedure: AORTIC ARCH ANGIOGRAPHY;  Surgeon: Elam Dutch, MD;  Location: [place] [facility];  Service: Cardiovascular;  Laterality: N/A;  . APPENDECTOMY  05/10/2016  . BELOW KNEE LEG AMPUTATION    . CATARACT EXTRACTION W/ INTRAOCULAR LENS IMPLANT Left   . LAPAROSCOPIC APPENDECTOMY N/A 05/10/2016   Procedure: APPENDECTOMY LAPAROSCOPIC;  Surgeon: Coralie Keens, MD;  Location: [place];  Service: General;  Laterality: N/A;  . LUMBAR LAMINECTOMY/DECOMPRESSION MICRODISCECTOMY N/A 09/09/2015    Procedure:  L4-S1 Decompression/ Discetomy;  Surgeon: Melina Schools, MD;  Location: [place];  Service: Orthopedics;  Laterality: N/A;  . SKIN GRAFT    . SPINAL CORD STIMULATOR INSERTION N/A 01/10/2019   Procedure: LUMBAR SPINAL CORD STIMULATOR INSERTION;  Surgeon: Melina Schools, MD;  Location: [place];  Service: Orthopedics;  Laterality: N/A;  2.5 hrs  . TONSILLECTOMY    . TUBAL LIGATION  1983  . VULVECTOMY N/A 01/28/2015   Procedure: WIDE EXCISION VULVECTOMY;  Surgeon: Thurnell Lose, MD;  Location: [place] [facility];  Service: Gynecology;  Laterality: N/A;    There were no vitals filed for this visit.   Subjective Assessment - 09/10/20 0930    Subjective Her exercises have been going well.  She saw prosthetist about inset / outset. He tweeked alignment.  It did improve limb pain distal lateral & proximal medial but did not go away.    Currently in Pain? No/denies   hip pain highest 3/10 when walking >6-7 min and immediately goes away   Pain Onset More than a month ago                While walking: 1. Turn to your head to look along 4 lines stopping in middle to glance forward. Right/left, up/down, up-right/down-left diagonal & up-left/down-right diagonal.  2.  Picking up pace by taking quicker & longer steps.  Walk normal pace 5 steps then fast 5 steps alternating. 3. Walking in dark or low vision.  Walk 5 steps with eyes open, then 5 steps with eyes closed alternating. 4. Walk Backwards. 5. Near counter or narrow hall. Drunk test walk heel to toe.  6. Pick a category like foods, animals, things on a farm. Walk & name items A-Z.  7. Stepping over obstacle.  Plant good foot (left) close to obstacle and glance down while prosthesis steps over obstacle.  Standing at sink 8. Look over shoulders: turn head first, then shoulder, then shift weight to foot on side that you are looking, then pull hip back. 9. Alternate tapping toes into lower cabinet. 10. Stand heel to toe. Raise hands & count to 50.  Pause when touch loose balance. Restart where you left off in count.  Switch leg in front & redo. 11. Place forefoot in lower cabinet. Make sure leg you are standing on is under your hip. Raise hands for 10 seconds. Alternate legs for 5 reps ea leg. 12.  Place a chair back behind you at sink.  Turn clockwise & counter clockwise taking quarter steps.  3 reps ea way.                       PT Education - 09/10/20 1244    Education Details see patient instructions    Person(s) Educated Patient    Methods Explanation;Demonstration;Tactile cues;Verbal cues;Handout    Comprehension Verbalized understanding;Returned demonstration;Verbal cues required;Tactile cues required               PT Long Term Goals - 09/03/20 1239      PT LONG TERM GOAL #1   Title Patient demonstrates & verbalizes understanding of ongoing HEP / fitness plan.    Time 4    Period Weeks    Status On-going    Target Date 09/17/20      PT LONG TERM GOAL #2   Title Functional Gait Assessment >/= 24/30    Time 4    Period Weeks    Status On-going    Target Date 09/17/20      PT LONG TERM GOAL #3   Title Cognitive TUG increases <2 sec from std TUG indicating less fall risk when distracted.    Time 4    Period Weeks    Status On-going    Target Date 09/17/20      PT LONG TERM GOAL #4   Title patient reports bilateral hip pain >/= 25% improvement with standing & gait activities.    Time 4    Period Weeks    Status On-going    Target Date 09/17/20                 Plan - 09/10/20 0930    Clinical Impression Statement PT instructed in Home exercise / activities to address deficits noted on her Berg Balance test and Functional Gait Assessment. She reports that they were challenging.   She appears to understand them & is safe performing.    Personal Factors and Comorbidities Comorbidity 3+    Comorbidities Rt TTA 02/17/2012, L4-S1 discectomy 2017, lumbar spinal stimulator 01/10/2019, TIA  2020, HTN,    Examination-Activity Limitations Carry;Lift;Locomotion Level;Stairs;Stand;Transfers    Examination-Participation Restrictions Church;Community Activity;Occupation    Stability/Clinical Decision Making Evolving/Moderate complexity    Rehab Potential Good    PT Frequency 1x / week  PT Duration 4 weeks    PT Treatment/Interventions ADLs/Self Care Home Management;Electrical Stimulation;Gait training;Stair training;Functional mobility training;Therapeutic activities;Therapeutic exercise;Balance training;DME Instruction;Neuromuscular re-education;Patient/family education;Prosthetic Training;Manual techniques;Passive range of motion;Dry needling;Taping;Vestibular    PT Next Visit Plan check LTGs, add stretches for ITB & piriformis, check HEPs    PT Home Exercise Plan Access Code: [email or web address]    Consulted and Agree with Plan of Care Patient           Patient will benefit from skilled therapeutic intervention in order to improve the following deficits and impairments:  Abnormal gait,Decreased activity tolerance,Decreased balance,Decreased mobility,Decreased skin integrity,Impaired flexibility,Postural dysfunction,Prosthetic Dependency,Pain  Visit Diagnosis: Other abnormalities of gait and mobility  Repeated falls  Unsteadiness on feet     Problem List Patient Active Problem List   Diagnosis Date Noted  . Upper airway cough syndrome 03/26/2019  . Chronic pain 01/10/2019  . MCI (mild cognitive impairment) 03/20/2017  . Gait abnormality 03/20/2017  . History of right below knee amputation ([place]) 07/11/2016  . Adenomatous colon polyp 05/10/2016  . Coronary artery calcification 01/07/2016  . Essential hypertension 01/07/2016  . Back pain 09/09/2015  . Abnormal CXR 05/16/2014  . Cough variant asthma ? assoc with UACS 05/15/2014  . Acute post-operative pain 02/18/2012  . Adiposity 02/17/2012  . Does use eyeglasses 02/13/2012  . H/O arthrodesis 10/21/2011  . Fibromyalgia  04/11/2011  . HLD (hyperlipidemia) 04/11/2011  . Leg edema 09/08/2010    Jamey Reas, PT, DPT 09/10/2020, 12:49 PM  [facility] Physical Therapy [address] [place], [place], [address] Phone: [phone]   Fax:  [phone]  Name: Shelly [name] MRN: [id number] Date of Birth: 1950-07-09

## 2020-09-16 ENCOUNTER — Encounter: Payer: Self-pay | Admitting: Primary Care

## 2020-09-16 ENCOUNTER — Other Ambulatory Visit: Payer: Self-pay

## 2020-09-16 ENCOUNTER — Ambulatory Visit (INDEPENDENT_AMBULATORY_CARE_PROVIDER_SITE_OTHER): Payer: Medicare Other

## 2020-09-16 ENCOUNTER — Ambulatory Visit (INDEPENDENT_AMBULATORY_CARE_PROVIDER_SITE_OTHER): Payer: Medicare Other | Admitting: Primary Care

## 2020-09-16 VITALS — BP 124/82 | HR 88 | Temp 97.2°F | Ht 67.0 in | Wt 187.4 lb

## 2020-09-16 DIAGNOSIS — J45991 Cough variant asthma: Secondary | ICD-10-CM

## 2020-09-16 DIAGNOSIS — R059 Cough, unspecified: Secondary | ICD-10-CM | POA: Diagnosis not present

## 2020-09-16 DIAGNOSIS — R053 Chronic cough: Secondary | ICD-10-CM | POA: Diagnosis not present

## 2020-09-16 DIAGNOSIS — R0989 Other specified symptoms and signs involving the circulatory and respiratory systems: Secondary | ICD-10-CM | POA: Diagnosis not present

## 2020-09-16 DIAGNOSIS — J189 Pneumonia, unspecified organism: Secondary | ICD-10-CM | POA: Diagnosis not present

## 2020-09-16 MED ORDER — PREDNISONE 10 MG PO TABS: ORAL_TABLET | ORAL | 0 refills | Status: DC

## 2020-09-16 MED ORDER — DOXYCYCLINE HYCLATE 100 MG PO TABS: 100.0000 mg | ORAL_TABLET | Freq: Two times a day (BID) | ORAL | 0 refills | Status: DC

## 2020-09-16 NOTE — Progress Notes (Signed)
@Patient  ID: Shelly [name], female    DOB: 1951/06/01, 70 y.o.   MRN: [id number]  Chief Complaint  Patient presents with  . Follow-up    Clarification on prescriptions for cough.     Referring provider: Carol Ada, MD    Brief patient profile:  76  yowf mother  of optimetrist  quit smoking 2008  watery eyes in her 37s year round ? Worse in winter eventually started on zyrtec 2013 and seemed to help s assoc cough/ wheeze then new onset cough Aug 2015 and proved refractory [name] referred to pulmonary clinic 05/15/14  by Dr Shelly [name]  HPI: 70 year old female, former smoker quit in 2008. PMH significant for cough variant asthma, UACS, HTN, coronary artery calcification, HLD, fibromyalgia. Patient of Dr. Melvyn [name], last seen on 03/05/20. Maintained on Symbicort 80, tessalon perles and nexium. PRN delsym and tramadol for cough. If not better prednisone course.   09/16/2020 Patient presents today for 6-7 month follow-up. She has had a chronic cough since 2015. Last couple weeks her cough has been worse. Reports that she was coughing [name] much the last day or two she had some incontinence. She is getting up some brown/purulent mucus from sinuses/chest. She restarted Symbicort 1 month ago. She can not take Delsym cough syrup because it caused some confusion in the past. She is compliant with Singulair, chlor-tabs and protonix BID. She has not tried taking Tramadol for cough. Denies f/c/s, chest tightness, shortness of breath, wheezing.    No Known Allergies  Immunization History  Administered Date(s) Administered  . Influenza Split 02/06/2011, 03/05/2013, 04/06/2014  . Influenza,inj,Quad PF,6+ Mos 04/24/2015  . Influenza-Unspecified 02/22/2011, 02/19/2012, 03/11/2019  . PFIZER(Purple Top)SARS-COV-2 Vaccination 06/23/2019, 07/13/2019  . Pneumococcal Conjugate-13 11/15/2013  . Tdap 04/11/2011  . Zoster 05/07/2012  . Zoster Recombinat (Shingrix) 09/28/2017, 02/01/2018    Past Medical  History:  Diagnosis Date  . Anxiety   . Arthritis   . Asthma   . Avascular necrosis of talus ([place])   . Cancer ([place])    vulva pre cancer had surgery  . Depression   . Gait abnormality 03/20/2017  . GERD (gastroesophageal reflux disease)   . Hearing loss    "very minor"  . History of pneumonia   . Hyperlipidemia   . Hypertension   . Leg edema    left  . Memory difficulty 03/20/2017  . PONV (postoperative nausea and vomiting)   . Stress incontinence     Tobacco History: Social History   Tobacco Use  Smoking Status Former Smoker  . Packs/day: 1.00  . Years: 40.00  . Pack years: 40.00  . Types: Cigarettes  . Quit date: 02/05/2007  . Years since quitting: 13.6  Smokeless Tobacco Never Used   Counseling given: Not Answered   Outpatient Medications Prior to Visit  Medication Sig Dispense Refill  . acidophilus (RISAQUAD) CAPS capsule Take 1 capsule by mouth daily.    Marland Kitchen aspirin EC 81 MG tablet Take 1 tablet (81 mg total) by mouth daily. 90 tablet 3  . b complex vitamins capsule Take 1 capsule by mouth daily.    . benzonatate (TESSALON) 200 MG capsule Take 200 mg by mouth 3 (three) times daily as needed for cough.    . Biotin 5000 MCG TABS Take 10,000 mcg by mouth every evening.    . budesonide-formoterol (SYMBICORT) 80-4.5 MCG/ACT inhaler Inhale 2 puffs into the lungs 2 (two) times daily. 10.7 g 11  . buPROPion (WELLBUTRIN XL) 300 MG  24 hr tablet Take 300 mg by mouth daily.    . chlorpheniramine (CHLOR-TRIMETON) 4 MG tablet Take 8 mg by mouth 2 (two) times a day.     . Cholecalciferol (VITAMIN D PO) Take 5,000 Units by mouth daily.    . Cholecalciferol (VITAMIN D-3) 125 MCG (5000 UT) TABS Take by mouth daily.    . Cyanocobalamin (B-12 PO) Take 5,000 mcg by mouth daily.     Marland Kitchen denosumab (PROLIA) 60 MG/ML SOLN injection Inject 60 mg into the skin every 6 (six) months.     . desonide (DESOWEN) 0.05 % cream APPLY TO AFFECTED AREA TWICE A DAY    . FLUoxetine (PROZAC) 20 MG capsule  Take 20 mg by mouth daily.    . Folic Acid-Cholecalciferol 06-4998 MG-UNIT TABS Take by mouth.    . L-Lysine 1000 MG TABS Take by mouth.    . losartan (COZAAR) 50 MG tablet Take 50 mg by mouth daily.     Marland Kitchen MAGNESIUM GLYCINATE PO Take by mouth.    . methocarbamol (ROBAXIN) 500 MG tablet Take 500 mg by mouth every 8 (eight) hours as needed.    . montelukast (SINGULAIR) 10 MG tablet Take 1 tablet (10 mg total) by mouth at bedtime. 30 tablet 11  . Multiple Vitamins-Minerals (CENTRUM SILVER PO) Take 1 tablet by mouth daily.    . NYSTATIN powder Apply 1 application topically 2 (two) times daily as needed.    . pantoprazole (PROTONIX) 40 MG tablet Take 1 tablet (40 mg total) by mouth 2 (two) times daily. 180 tablet 1  . Potassium 99 MG TABS Take by mouth.    . rosuvastatin (CRESTOR) 10 MG tablet Take 10 mg by mouth every evening.   1  . traMADol (ULTRAM) 50 MG tablet Take 50 mg by mouth 3 (three) times daily as needed.    . valACYclovir (VALTREX) 1000 MG tablet Take 1,000 mg by mouth daily as needed.     Marland Kitchen CALCIUM CITRATE PO Take 400 mg by mouth 2 (two) times a day.     No facility-administered medications prior to visit.    Review of Systems  Review of Systems  Constitutional: Negative.   HENT: Positive for postnasal drip.   Respiratory: Positive for cough.    Physical Exam  BP 124/82 (BP Location: Right Arm, Cuff Size: Normal)   Pulse 88   Temp (!) 97.2 F (36.2 C) (Temporal)   Ht 5\' 7"  (1.702 m)   Wt 187 lb 6.4 oz (85 kg)   LMP  (LMP Unknown)   SpO2 95% Comment: RA  BMI 29.35 kg/m  Physical Exam Constitutional:      Appearance: Normal appearance.  HENT:     Head: Normocephalic and atraumatic.     Mouth/Throat:     Mouth: Mucous membranes are moist.     Pharynx: Oropharynx is clear.     Comments: Deferred d/t masking Cardiovascular:     Rate and Rhythm: Normal rate and regular rhythm.  Pulmonary:     Breath sounds: Rales present.     Comments: Rales right base; reactive  cough Skin:    General: Skin is warm and dry.  Neurological:     General: No focal deficit present.     Mental Status: She is alert and oriented to person, place, and time. Mental status is at baseline.  Psychiatric:        Mood and Affect: Mood normal.        Behavior: Behavior normal.  Thought Content: Thought content normal.        Judgment: Judgment normal.      Lab Results:  CBC    Component Value Date/Time   WBC 10.3 01/11/2019 1432   RBC 3.47 (L) 01/11/2019 1432   HGB 10.4 (L) 01/11/2019 1432   HCT 34.0 (L) 01/11/2019 1432   PLT 309 01/11/2019 1432   MCV 98.0 01/11/2019 1432   MCH 30.0 01/11/2019 1432   MCHC 30.6 01/11/2019 1432   RDW 12.8 01/11/2019 1432   LYMPHSABS 2.1 01/11/2019 1432   MONOABS 1.0 01/11/2019 1432   EOSABS 0.0 01/11/2019 1432   BASOSABS 0.0 01/11/2019 1432    BMET    Component Value Date/Time   NA 136 01/11/2019 1432   NA 142 09/18/2017 1244   K 3.8 01/11/2019 1432   CL 98 01/11/2019 1432   CO2 28 01/11/2019 1432   GLUCOSE 99 01/11/2019 1432   BUN 15 01/11/2019 1432   BUN 15 09/18/2017 1244   CREATININE 0.95 01/11/2019 1432   CREATININE 0.83 02/12/2015 0847   CALCIUM 8.7 (L) 01/11/2019 1432   GFRNONAA >60 01/11/2019 1432   GFRAA >60 01/11/2019 1432    BNP No results found for: BNP  ProBNP    Component Value Date/Time   PROBNP 9.7 09/08/2010 1617    Imaging: DG Chest 2 View  Result Date: 09/16/2020 CLINICAL DATA:  Worsening chronic cough.  Rales at right base. EXAM: CHEST - 2 VIEW COMPARISON:  Radiograph and CT 01/11/2019. FINDINGS: Chronic elevation of the left hemidiaphragm. Stable heart size and mediastinal contours. Aortic atherosclerosis. Calcified granuloma in the left mid lung. Minimal patchy opacity in the periphery of the right mid lung. Mild streaky right infrahilar opacity. No pneumothorax, pleural effusion, or pulmonary edema. Spinal stimulator in place. No acute osseous abnormalities are seen. IMPRESSION: 1.  Minimal patchy opacity in the periphery of the right mid lung as well as streaky right infrahilar opacities may represent atelectasis or pneumonia. 2. Chronic elevation of left hemidiaphragm. Aortic Atherosclerosis (ICD10-I70.0). Electronically Signed   By: Keith Rake M.D.   On: 09/16/2020 16:53     Assessment & Plan:   Cough variant asthma ? assoc with UACS - Chronic cough x 5-7 year, worse the last month. She resumed Symbicort 80 two puffs twice daily without improvement. She is compliant with Singulair, chlorpheniramine and Protonix BID. Sending in prednisone taper and she can take tramadol three times a day for cough. We will check CXR today d/t rales heard RLL on exam.   Community acquired pneumonia - CXR 09/16/2020 showed minimal opacity RLL. Treating for pneumonia d/t clinical symptoms and exam findings. RX Doxycycline 100mg  1 tab twice daily x 7 days. Needs repeat CXR in 4-6 weeks.    Martyn Ehrich, NP 09/16/2020

## 2020-09-16 NOTE — Assessment & Plan Note (Addendum)
-   CXR 09/16/2020 showed minimal opacity RML. Treating for pneumonia d/t clinical symptoms and exam findings. RX Doxycycline 100mg  1 tab twice daily x 7 days. Needs repeat CXR in 4-6 weeks.

## 2020-09-16 NOTE — Assessment & Plan Note (Signed)
-   Chronic cough x 5-7 year, worse the last month. She resumed Symbicort 80 two puffs twice daily without improvement. She is compliant with Singulair, chlorpheniramine and Protonix BID. Sending in prednisone taper and she can take tramadol three times a day for cough. We will check CXR today d/t rales heard RLL on exam.

## 2020-09-16 NOTE — Patient Instructions (Addendum)
Continue Symbicort 80 two puffs first thing in am and then another 2 puffs about 12 hours later   Continue protonix 40 mg Take 30- 60 min before your first and last meals of the day  OR Nexium 20mg  twice daily   Continue Singulair, Chlor-tabs, tessalon perles and over the counter nasal spray   Only use the tramadol if cough totally out of control or persists after a full week of the symbicort to distinguish the benefit of symbicort or lack thereof  New recommendations: - Start prednisone taper and use tramadol 1 tab every 6 hours for cough. Once you have eliminated cough for 3 straight days taper off tramadol   Orders: -CXR today re: cough  Follow-up: -6 months with Dr. Melvyn Novas   Food Choices for Gastroesophageal Reflux Disease, Adult When you have gastroesophageal reflux disease (GERD), the foods you eat and your eating habits are very important. Choosing the right foods can help ease your discomfort. Think about working with a food expert (dietitian) to help you make good choices. What are tips for following this plan? Reading food labels  Look for foods that are low in saturated fat. Foods that may help with your symptoms include: ? Foods that have less than 5% of daily value (DV) of fat. ? Foods that have 0 grams of trans fat. Cooking  Do not fry your food.  Cook your food by baking, steaming, grilling, or broiling. These are all methods that do not need a lot of fat for cooking.  To add flavor, try to use herbs that are low in spice and acidity. Meal planning  Choose healthy foods that are low in fat, such as: ? Fruits and vegetables. ? Whole grains. ? Low-fat dairy products. ? Lean meats, fish, and poultry.  Eat small meals often instead of eating 3 large meals each day. Eat your meals slowly in a place where you are relaxed. Avoid bending over or lying down until 2-3 hours after eating.  Limit high-fat foods such as fatty meats or fried foods.  Limit your intake of  fatty foods, such as oils, butter, and shortening.  Avoid the following as told by your doctor: ? Foods that cause symptoms. These may be different for different people. Keep a food diary to keep track of foods that cause symptoms. ? Alcohol. ? Drinking a lot of liquid with meals. ? Eating meals during the 2-3 hours before bed.   Lifestyle  Stay at a healthy weight. Ask your doctor what weight is healthy for you. If you need to lose weight, work with your doctor to do so safely.  Exercise for at least 30 minutes on 5 or more days each week, or as told by your doctor.  Wear loose-fitting clothes.  Do not smoke or use any products that contain nicotine or tobacco. If you need help quitting, ask your doctor.  Sleep with the head of your bed higher than your feet. Use a wedge under the mattress or blocks under the bed frame to raise the head of the bed.  Chew sugar-free gum after meals. What foods should eat? Eat a healthy, well-balanced diet of fruits, vegetables, whole grains, low-fat dairy products, lean meats, fish, and poultry. Each person is different. Foods that may cause symptoms in one person may not cause any symptoms in another person. Work with your doctor to find foods that are safe for you. The items listed above may not be a complete list of what you can eat  and drink. Contact a food expert for more options.   What foods should I avoid? Limiting some of these foods may help in managing the symptoms of GERD. Everyone is different. Talk with a food expert or your doctor to help you find the exact foods to avoid, if any. Fruits Any fruits prepared with added fat. Any fruits that cause symptoms. For some people, this may include citrus fruits, such as oranges, grapefruit, pineapple, and lemons. Vegetables Deep-fried vegetables. [place] fries. Any vegetables prepared with added fat. Any vegetables that cause symptoms. For some people, this may include tomatoes and tomato products,  chili peppers, onions and garlic, and horseradish. Grains Pastries or quick breads with added fat. Meats and other proteins High-fat meats, such as fatty beef or pork, hot dogs, ribs, ham, sausage, salami, and bacon. Fried meat or protein, including fried fish and fried chicken. Nuts and nut butters, in large amounts. Dairy Whole milk and chocolate milk. Sour cream. Cream. Ice cream. Cream cheese. Milkshakes. Fats and oils Butter. Margarine. Shortening. Ghee. Beverages Coffee and tea, with or without caffeine. Carbonated beverages. Sodas. Energy drinks. Fruit juice made with acidic fruits, such as orange or grapefruit. Tomato juice. Alcoholic drinks. Sweets and desserts Chocolate and cocoa. Donuts. Seasonings and condiments Pepper. Peppermint and spearmint. Added salt. Any condiments, herbs, or seasonings that cause symptoms. For some people, this may include curry, hot sauce, or vinegar-based salad dressings. The items listed above may not be a complete list of what you should not eat and drink. Contact a food expert for more options. Questions to ask your doctor Diet and lifestyle changes are often the first steps that are taken to manage symptoms of GERD. If diet and lifestyle changes do not help, talk with your doctor about taking medicines. Where to find more information  International [employer] for Gastrointestinal Disorders: aboutgerd.org Summary  When you have GERD, food and lifestyle choices are very important in easing your symptoms.  Eat small meals often instead of 3 large meals a day. Eat your meals slowly and in a place where you are relaxed.  Avoid bending over or lying down until 2-3 hours after eating.  Limit high-fat foods such as fatty meats or fried foods. This information is not intended to replace advice given to you by your health care provider. Make sure you discuss any questions you have with your health care provider. Document Revised: 12/02/2019 Document  Reviewed: 12/02/2019 Elsevier Patient Education  [place].

## 2020-09-17 ENCOUNTER — Encounter: Payer: Medicare Other | Admitting: Physical Therapy

## 2020-09-23 ENCOUNTER — Encounter: Payer: Medicare Other | Admitting: Physical Therapy

## 2020-09-24 ENCOUNTER — Encounter: Payer: Medicare Other | Admitting: Physical Therapy

## 2020-09-24 ENCOUNTER — Other Ambulatory Visit: Payer: Self-pay

## 2020-09-24 ENCOUNTER — Ambulatory Visit (INDEPENDENT_AMBULATORY_CARE_PROVIDER_SITE_OTHER): Payer: Medicare Other | Admitting: Physical Therapy

## 2020-09-24 ENCOUNTER — Encounter: Payer: Self-pay | Admitting: Physical Therapy

## 2020-09-24 DIAGNOSIS — R2681 Unsteadiness on feet: Secondary | ICD-10-CM

## 2020-09-24 DIAGNOSIS — R296 Repeated falls: Secondary | ICD-10-CM | POA: Diagnosis not present

## 2020-09-24 DIAGNOSIS — R2689 Other abnormalities of gait and mobility: Secondary | ICD-10-CM

## 2020-09-24 DIAGNOSIS — M810 Age-related osteoporosis without current pathological fracture: Secondary | ICD-10-CM | POA: Diagnosis not present

## 2020-09-24 DIAGNOSIS — F325 Major depressive disorder, single episode, in full remission: Secondary | ICD-10-CM | POA: Diagnosis not present

## 2020-09-24 DIAGNOSIS — K219 Gastro-esophageal reflux disease without esophagitis: Secondary | ICD-10-CM | POA: Diagnosis not present

## 2020-09-24 DIAGNOSIS — I10 Essential (primary) hypertension: Secondary | ICD-10-CM | POA: Diagnosis not present

## 2020-09-24 DIAGNOSIS — Z89511 Acquired absence of right leg below knee: Secondary | ICD-10-CM | POA: Diagnosis not present

## 2020-09-24 DIAGNOSIS — E785 Hyperlipidemia, unspecified: Secondary | ICD-10-CM | POA: Diagnosis not present

## 2020-09-24 NOTE — Therapy (Signed)
[facility] Physical Therapy [address] [place], [place], [address] Phone: [phone]   Fax:  [phone]  Physical Therapy Treatment & Discharge Summary  Patient Details  Name: Shelly [name] MRN: [id number] Date of Birth: May 10, 1951 Referring Provider (PT): Meridee Score, MD   Encounter Date: 09/24/2020   PHYSICAL THERAPY DISCHARGE SUMMARY  Visits from Start of Care: 5  Current functional level related to goals / functional outcomes: See below   Remaining deficits: See below   Education / Equipment: Fitness program & prosthetic care  Plan: Patient agrees to discharge.  Patient goals were met. Patient is being discharged due to meeting the stated rehab goals.  ?????            PT End of Session - 09/24/20 1259    Visit Number 5    Number of Visits 5    Date for PT Re-Evaluation 09/17/20    Authorization Type Medicare A&B, BCBS supp    PT Start Time 1259    PT Stop Time 1345    PT Time Calculation (min) 46 min    Activity Tolerance Patient tolerated treatment well    Behavior During Therapy WFL for tasks assessed/performed           Past Medical History:  Diagnosis Date  . Anxiety   . Arthritis   . Asthma   . Avascular necrosis of talus ([place])   . Cancer ([place])    vulva pre cancer had surgery  . Depression   . Gait abnormality 03/20/2017  . GERD (gastroesophageal reflux disease)   . Hearing loss    "very minor"  . History of pneumonia   . Hyperlipidemia   . Hypertension   . Leg edema    left  . Memory difficulty 03/20/2017  . PONV (postoperative nausea and vomiting)   . Stress incontinence     Past Surgical History:  Procedure Laterality Date  . ANKLE FUSION  2011   right multiple   . ANKLE FUSION     rear ankle fusion  . ANKLE SURGERY  2010   right cordicompression  . AORTIC ARCH ANGIOGRAPHY N/A 12/11/2017   Procedure: AORTIC ARCH ANGIOGRAPHY;  Surgeon: Elam Dutch, MD;  Location: [facility];   Service: Cardiovascular;  Laterality: N/A;  . APPENDECTOMY  05/10/2016  . BELOW KNEE LEG AMPUTATION    . CATARACT EXTRACTION W/ INTRAOCULAR LENS IMPLANT Left   . LAPAROSCOPIC APPENDECTOMY N/A 05/10/2016   Procedure: APPENDECTOMY LAPAROSCOPIC;  Surgeon: Coralie Keens, MD;  Location: [facility];  Service: General;  Laterality: N/A;  . LUMBAR LAMINECTOMY/DECOMPRESSION MICRODISCECTOMY N/A 09/09/2015   Procedure:  L4-S1 Decompression/ Discetomy;  Surgeon: Melina Schools, MD;  Location: [facility];  Service: Orthopedics;  Laterality: N/A;  . SKIN GRAFT    . SPINAL CORD STIMULATOR INSERTION N/A 01/10/2019   Procedure: LUMBAR SPINAL CORD STIMULATOR INSERTION;  Surgeon: Melina Schools, MD;  Location: [facility];  Service: Orthopedics;  Laterality: N/A;  2.5 hrs  . TONSILLECTOMY    . TUBAL LIGATION  1983  . VULVECTOMY N/A 01/28/2015   Procedure: WIDE EXCISION VULVECTOMY;  Surgeon: Thurnell Lose, MD;  Location: [facility];  Service: Gynecology;  Laterality: N/A;    There were no vitals filed for this visit.   Subjective Assessment - 09/24/20 1300    Subjective She had pneumonia and has been limited in activity.  She has been doing some of her exercises breaking them into groups as PT recommended.    Currently in Pain?  Yes    Pain Score 4     Pain Location Hip    Pain Orientation Left;Right    Pain Descriptors / Indicators Aching;Sore    Pain Type Chronic pain    Pain Onset More than a month ago    Pain Frequency Intermittent    Aggravating Factors  walking    Pain Relieving Factors exercises & resting              [facility] PT Assessment - 09/24/20 1300      Assessment   Medical Diagnosis Right Transtibial Amputation    Referring Provider (PT) Meridee Score, MD      Ambulation/Gait   Ambulation/Gait Yes    Ambulation/Gait Assistance 7: Independent    Ambulation Distance (Feet) 500 Feet    Assistive device Prosthesis;None    Gait Pattern Within Functional Limits    Ambulation Surface Level;Indoor    Gait  velocity 3.76 ft/sec comfortable & 5.54 ft/sec fast pace   Initial on 3/14 was 3.29 ft/sec comfortable & 5.08 ft/sec fast   Stairs Yes    Stairs Assistance 6: Modified independent (Device/Increase time)    Stair Management Technique One rail Left;Alternating pattern;Forwards    Number of Stairs 11    Height of Stairs 6      Timed Up and Go Test   Normal TUG (seconds) 9.34    Cognitive TUG (seconds) 9.63    TUG Comments 0.29sec difference today. At eval on 3/14 difference was 2.74 sec      Functional Gait  Assessment   Gait assessed  Yes    Gait Level Surface Walks 20 ft in less than 5.5 sec, no assistive devices, good speed, no evidence for imbalance, normal gait pattern, deviates no more than 6 in outside of the 12 in walkway width.    Change in Gait Speed Able to smoothly change walking speed without loss of balance or gait deviation. Deviate no more than 6 in outside of the 12 in walkway width.    Gait with Horizontal Head Turns Performs head turns smoothly with no change in gait. Deviates no more than 6 in outside 12 in walkway width    Gait with Vertical Head Turns Performs head turns with no change in gait. Deviates no more than 6 in outside 12 in walkway width.    Gait and Pivot Turn Pivot turns safely within 3 sec and stops quickly with no loss of balance.    Step Over Obstacle Is able to step over 2 stacked shoe boxes taped together (9 in total height) without changing gait speed. No evidence of imbalance.    Gait with Narrow Base of Support Ambulates 4-7 steps.    Gait with Eyes Closed Walks 20 ft, no assistive devices, good speed, no evidence of imbalance, normal gait pattern, deviates no more than 6 in outside 12 in walkway width. Ambulates 20 ft in less than 7 sec.    Ambulating Backwards Walks 20 ft, no assistive devices, good speed, no evidence for imbalance, normal gait    Steps Alternating feet, must use rail.    Total Score 27    FGA comment: Initial FGA 18/30            Prosthetics Assessment - 09/24/20 1300      Prosthetics   Prosthetic Care Independent with Skin check;Residual limb care;Prosthetic cleaning;Ply sock cleaning;Correct ply sock adjustment;Proper wear schedule/adjustment;Proper weight-bearing schedule/adjustment    Prosthetic Care Comments  PT demo & Instructed in pulling suction  sleeve up in standing so limb is full seated in socket.  PT also demo folding socks over socket brim to minimize socks migrating & bunching in socket.  PT & pt discussed options for night time toileting & showering when traveling. Pt verbalized understanding of all above.    Donning prosthesis  Independent    Doffing prosthesis  Independent    Current prosthetic wear tolerance (days/week)  daily    Current prosthetic wear tolerance (#hours/day)  most of awake hours                                     PT Long Term Goals - 09/24/20 1353      PT LONG TERM GOAL #1   Title Patient demonstrates & verbalizes understanding of ongoing HEP / fitness plan.    Time 4    Period Weeks    Status Achieved      PT LONG TERM GOAL #2   Title Functional Gait Assessment >/= 24/30    Baseline MET FGA 27/30 from initial 18/30    Time 4    Period Weeks    Status Achieved      PT LONG TERM GOAL #3   Title Cognitive TUG increases <2 sec from std TUG indicating less fall risk when distracted.    Baseline MET cognitive TUG increase only 0.29 sec from std TUG    Time 4    Period Weeks    Status Achieved      PT LONG TERM GOAL #4   Title patient reports bilateral hip pain >/= 25% improvement with standing & gait activities.    Time 4    Period Weeks    Status Achieved                 Plan - 09/24/20 1300    Clinical Impression Statement Patient met all LTGs.  She reports significant improvement in hip pain.  Patient has lower fall risk with improved Functional Gait Assessment to 27/30 & improved cognitive TUG time.  Patient appears to  understand well rounded fitness program and plans to continue as recommended.    Personal Factors and Comorbidities Comorbidity 3+    Comorbidities Rt TTA 02/17/2012, L4-S1 discectomy 2017, lumbar spinal stimulator 01/10/2019, TIA 2020, HTN,    Examination-Activity Limitations Carry;Lift;Locomotion Level;Stairs;Stand;Transfers    Examination-Participation Restrictions Church;Community Activity;Occupation    Stability/Clinical Decision Making Evolving/Moderate complexity    Rehab Potential Good    PT Frequency 1x / week    PT Duration 4 weeks    PT Treatment/Interventions ADLs/Self Care Home Management;Electrical Stimulation;Gait training;Stair training;Functional mobility training;Therapeutic activities;Therapeutic exercise;Balance training;DME Instruction;Neuromuscular re-education;Patient/family education;Prosthetic Training;Manual techniques;Passive range of motion;Dry needling;Taping;Vestibular    PT Next Visit Plan discharge    PT Home Exercise Plan Access Code: [email or web address]    Consulted and Agree with Plan of Care Patient           Patient will benefit from skilled therapeutic intervention in order to improve the following deficits and impairments:  Abnormal gait,Decreased activity tolerance,Decreased balance,Decreased mobility,Decreased skin integrity,Impaired flexibility,Postural dysfunction,Prosthetic Dependency,Pain  Visit Diagnosis: Other abnormalities of gait and mobility  Repeated falls  Unsteadiness on feet     Problem List Patient Active Problem List   Diagnosis Date Noted  . Community acquired pneumonia 09/16/2020  . Upper airway cough syndrome 03/26/2019  . Chronic pain 01/10/2019  . MCI (mild cognitive impairment) 03/20/2017  . Gait  abnormality 03/20/2017  . History of right below knee amputation ([place]) 07/11/2016  . Adenomatous colon polyp 05/10/2016  . Coronary artery calcification 01/07/2016  . Essential hypertension 01/07/2016  . Back pain 09/09/2015  .  Abnormal CXR 05/16/2014  . Cough variant asthma ? assoc with UACS 05/15/2014  . Acute post-operative pain 02/18/2012  . Adiposity 02/17/2012  . Does use eyeglasses 02/13/2012  . H/O arthrodesis 10/21/2011  . Fibromyalgia 04/11/2011  . HLD (hyperlipidemia) 04/11/2011  . Leg edema 09/08/2010    Jamey Reas, PT, DPT 09/24/2020, 1:56 PM  [facility] Physical Therapy [address] [place], [place], [address] Phone: [phone]   Fax:  [phone]  Name: Ariday Brinker MRN: [id number] Date of Birth: 09-Feb-1951

## 2020-10-01 ENCOUNTER — Encounter: Payer: Medicare Other | Admitting: Physical Therapy

## 2020-10-01 ENCOUNTER — Other Ambulatory Visit: Payer: Self-pay | Admitting: Interventional Cardiology

## 2020-10-08 ENCOUNTER — Encounter: Payer: Medicare Other | Admitting: Rehabilitation

## 2020-10-12 DIAGNOSIS — Z79899 Other long term (current) drug therapy: Secondary | ICD-10-CM | POA: Diagnosis not present

## 2020-10-12 DIAGNOSIS — M47817 Spondylosis without myelopathy or radiculopathy, lumbosacral region: Secondary | ICD-10-CM | POA: Diagnosis not present

## 2020-10-12 DIAGNOSIS — M47816 Spondylosis without myelopathy or radiculopathy, lumbar region: Secondary | ICD-10-CM | POA: Diagnosis not present

## 2020-10-14 ENCOUNTER — Ambulatory Visit (INDEPENDENT_AMBULATORY_CARE_PROVIDER_SITE_OTHER): Payer: Medicare Other

## 2020-10-14 ENCOUNTER — Telehealth: Payer: Self-pay | Admitting: Primary Care

## 2020-10-14 DIAGNOSIS — J189 Pneumonia, unspecified organism: Secondary | ICD-10-CM | POA: Diagnosis not present

## 2020-10-14 DIAGNOSIS — J9811 Atelectasis: Secondary | ICD-10-CM | POA: Diagnosis not present

## 2020-10-14 NOTE — Telephone Encounter (Signed)
Pt came into the office today for cxr.  This has been done.  Nothing further is needed.

## 2020-10-16 NOTE — Progress Notes (Signed)
Please let patient know CXR showed no acute cardiopulmonary disease. She had mild atelectasis which is unchanged.

## 2020-10-21 DIAGNOSIS — M810 Age-related osteoporosis without current pathological fracture: Secondary | ICD-10-CM | POA: Diagnosis not present

## 2020-10-25 ENCOUNTER — Other Ambulatory Visit: Payer: Self-pay | Admitting: Internal Medicine

## 2020-10-29 ENCOUNTER — Ambulatory Visit: Payer: Medicare Other

## 2020-11-03 DIAGNOSIS — M47816 Spondylosis without myelopathy or radiculopathy, lumbar region: Secondary | ICD-10-CM | POA: Diagnosis not present

## 2020-11-13 ENCOUNTER — Other Ambulatory Visit: Payer: Self-pay | Admitting: Internal Medicine

## 2020-11-23 DIAGNOSIS — M47816 Spondylosis without myelopathy or radiculopathy, lumbar region: Secondary | ICD-10-CM | POA: Diagnosis not present

## 2020-11-23 DIAGNOSIS — M25562 Pain in left knee: Secondary | ICD-10-CM | POA: Diagnosis not present

## 2020-11-30 DIAGNOSIS — K219 Gastro-esophageal reflux disease without esophagitis: Secondary | ICD-10-CM | POA: Diagnosis not present

## 2020-11-30 DIAGNOSIS — I10 Essential (primary) hypertension: Secondary | ICD-10-CM | POA: Diagnosis not present

## 2020-11-30 DIAGNOSIS — M810 Age-related osteoporosis without current pathological fracture: Secondary | ICD-10-CM | POA: Diagnosis not present

## 2020-11-30 DIAGNOSIS — G459 Transient cerebral ischemic attack, unspecified: Secondary | ICD-10-CM | POA: Diagnosis not present

## 2020-11-30 DIAGNOSIS — E785 Hyperlipidemia, unspecified: Secondary | ICD-10-CM | POA: Diagnosis not present

## 2020-11-30 DIAGNOSIS — F329 Major depressive disorder, single episode, unspecified: Secondary | ICD-10-CM | POA: Diagnosis not present

## 2020-11-30 DIAGNOSIS — N1831 Chronic kidney disease, stage 3a: Secondary | ICD-10-CM | POA: Diagnosis not present

## 2020-12-16 ENCOUNTER — Other Ambulatory Visit: Payer: Self-pay

## 2020-12-16 ENCOUNTER — Ambulatory Visit
Admission: RE | Admit: 2020-12-16 | Discharge: 2020-12-16 | Disposition: A | Discharge status: Home / Self Care | Payer: Medicare Other | Source: Ambulatory Visit | Attending: Obstetrics and Gynecology | Admitting: Obstetrics and Gynecology

## 2020-12-16 DIAGNOSIS — Z1231 Encounter for screening mammogram for malignant neoplasm of breast: Secondary | ICD-10-CM

## 2020-12-17 DIAGNOSIS — M47816 Spondylosis without myelopathy or radiculopathy, lumbar region: Secondary | ICD-10-CM | POA: Diagnosis not present

## 2020-12-23 DIAGNOSIS — Z20822 Contact with and (suspected) exposure to covid-19: Secondary | ICD-10-CM | POA: Diagnosis not present

## 2020-12-24 ENCOUNTER — Telehealth: Payer: Self-pay | Admitting: Orthopedic Surgery

## 2020-12-24 NOTE — Telephone Encounter (Signed)
Pt calling asking to speak with a nurse about getting 2 sleeves and a pack of socks (this recommendation given by another provider at her appt yesterday). The best call back number is [phone].

## 2020-12-24 NOTE — Telephone Encounter (Signed)
Can you write th is order for pt to take to [facility]

## 2020-12-25 NOTE — Telephone Encounter (Signed)
Patient will come by and pick it up

## 2020-12-25 NOTE — Telephone Encounter (Signed)
done

## 2020-12-29 ENCOUNTER — Telehealth: Payer: Self-pay

## 2020-12-29 NOTE — Telephone Encounter (Signed)
I called pt and advised that she has not been in the office since 04/2020 and insurance requires an office visit face to face within 6 months of order. She will come in tomorrow at 3:30

## 2020-12-29 NOTE — Telephone Encounter (Signed)
Patient called she stated she received a vm from [facility] yesterday regarding her needing to schedule a appointment patient does not know why she needs to schedule a appointment patient is requesting a call [phone]

## 2020-12-30 ENCOUNTER — Encounter: Payer: Self-pay | Admitting: Family

## 2020-12-30 ENCOUNTER — Ambulatory Visit (INDEPENDENT_AMBULATORY_CARE_PROVIDER_SITE_OTHER): Payer: Medicare Other | Admitting: Family

## 2020-12-30 ENCOUNTER — Other Ambulatory Visit: Payer: Self-pay

## 2020-12-30 DIAGNOSIS — I2510 Atherosclerotic heart disease of native coronary artery without angina pectoris: Secondary | ICD-10-CM | POA: Diagnosis not present

## 2020-12-30 DIAGNOSIS — I2584 Coronary atherosclerosis due to calcified coronary lesion: Secondary | ICD-10-CM | POA: Diagnosis not present

## 2020-12-30 DIAGNOSIS — Z89511 Acquired absence of right leg below knee: Secondary | ICD-10-CM

## 2020-12-30 NOTE — Progress Notes (Signed)
Office Visit Note   Patient: Shelly [name]           Date of Birth: [birth year]           MRN: [id number] Visit Date: 12/30/2020              Requested by: Carol Ada, [address] [place],  [place] [address] PCP: Carol Ada, MD  Chief Complaint  Patient presents with   Right Leg - Follow-up    HX BKA needs updated face to face for [employer]anger rx       HPI: The patient is a 70 year old woman who is status post right below-knee amputation she is in a new prosthetic as of February unfortunately her liners are broken down she has not had new liners in quite some time the liners have holes in them which have been causing ulceration and callus to her popliteal fossa as well as her knee due to volume change she is also had several falls one of them unfortunately resulted in a C4 fracture  Assessment & Plan: Visit Diagnoses: No diagnosis found.  Plan: Given an order to [facility] clinic for prosthesis supplies she needs new [employer] new shrinkers and new socks for her residual limb she is doing quite well with her new socket and foot pleased with her microprocessor.  Follow-Up Instructions: Return if symptoms worsen or fail to improve.   Ortho Exam  Patient is alert, oriented, no adenopathy, well-dressed, normal affect, normal respiratory effort. On examination of the right residual limb overall this is well consolidated and well-healed she does have some callused areas in her popliteal fossa as well as over the tibial tubercle and from an bearing to the distal residual limb there is no open ulceration today no drainage no erythema  Imaging: No results found. No images are attached to the encounter.  Labs: Lab Results  Component Value Date   ESRSEDRATE 2 03/20/2017   ESRSEDRATE 10 09/08/2010     Lab Results  Component Value Date   ALBUMIN 3.2 (L) 01/11/2019    Lab Results  Component Value Date   MG 1.7 02/12/2015   No results found for: VD25OH  No  results found for: PREALBUMIN CBC EXTENDED Latest Ref Rng & Units 01/11/2019 01/02/2019 12/11/2017  WBC 4.0 - 10.5 K/uL 10.3 8.7 -  RBC 3.87 - 5.11 MIL/uL 3.47(L) 3.91 -  HGB 12.0 - 15.0 g/dL 10.4(L) 12.0 13.3  HCT 36.0 - 46.0 % 34.0(L) 37.8 39.0  PLT 150 - 400 K/uL 309 308 -  NEUTROABS 1.7 - 7.7 K/uL 7.0 - -  LYMPHSABS 0.7 - 4.0 K/uL 2.1 - -     There is no height or weight on file to calculate BMI.  Orders:  No orders of the defined types were placed in this encounter.  No orders of the defined types were placed in this encounter.    Procedures: No procedures performed  Clinical Data: No additional findings.  ROS:  All other systems negative, except as noted in the HPI. Review of Systems  Objective: Vital Signs: LMP  (LMP Unknown)   Specialty Comments:  No specialty comments available.  PMFS History: Patient Active Problem List   Diagnosis Date Noted   Community acquired pneumonia 09/16/2020   Upper airway cough syndrome 03/26/2019   Chronic pain 01/10/2019   MCI (mild cognitive impairment) 03/20/2017   Gait abnormality 03/20/2017   History of right below knee amputation ([place]) 07/11/2016   Adenomatous colon polyp  05/10/2016   Coronary artery calcification 01/07/2016   Essential hypertension 01/07/2016   Back pain 09/09/2015   Abnormal CXR 05/16/2014   Cough variant asthma ? assoc with UACS 05/15/2014   Acute post-operative pain 02/18/2012   Adiposity 02/17/2012   Does use eyeglasses 02/13/2012   H/O arthrodesis 10/21/2011   Fibromyalgia 04/11/2011   HLD (hyperlipidemia) 04/11/2011   Leg edema 09/08/2010   Past Medical History:  Diagnosis Date   Anxiety    Arthritis    Asthma    Avascular necrosis of talus ([place])    Cancer ([place])    vulva pre cancer had surgery   Depression    Gait abnormality 03/20/2017   GERD (gastroesophageal reflux disease)    Hearing loss    "very minor"   History of pneumonia    Hyperlipidemia    Hypertension    Leg edema     left   Memory difficulty 03/20/2017   PONV (postoperative nausea and vomiting)    Stress incontinence     Family History  Problem Relation Age of Onset   Dementia Mother 68       alive   Fibromyalgia Mother    Allergies Mother    Heart attack Father 71       deceased   Multiple myeloma Father    Other Brother        alive   Heart disease Brother        emergent CABG for 99% blocked CAD   Heart murmur Sister 82       she had open heart surgery   Other Sister 62       She is bIpolar- diabetic   Breast cancer Sister     Past Surgical History:  Procedure Laterality Date   ANKLE FUSION  2011   right multiple    ANKLE FUSION     rear ankle fusion   ANKLE SURGERY  2010   right cordicompression   AORTIC ARCH ANGIOGRAPHY N/A 12/11/2017   Procedure: AORTIC ARCH ANGIOGRAPHY;  Surgeon: Elam Dutch, MD;  Location: [facility];  Service: Cardiovascular;  Laterality: N/A;   APPENDECTOMY  05/10/2016   BELOW KNEE LEG AMPUTATION     CATARACT EXTRACTION W/ INTRAOCULAR LENS IMPLANT Left    LAPAROSCOPIC APPENDECTOMY N/A 05/10/2016   Procedure: APPENDECTOMY LAPAROSCOPIC;  Surgeon: Coralie Keens, MD;  Location: [facility];  Service: General;  Laterality: N/A;   LUMBAR LAMINECTOMY/DECOMPRESSION MICRODISCECTOMY N/A 09/09/2015   Procedure:  L4-S1 Decompression/ Discetomy;  Surgeon: Melina Schools, MD;  Location: [facility];  Service: Orthopedics;  Laterality: N/A;   SKIN GRAFT     SPINAL CORD STIMULATOR INSERTION N/A 01/10/2019   Procedure: LUMBAR SPINAL CORD STIMULATOR INSERTION;  Surgeon: Melina Schools, MD;  Location: [facility];  Service: Orthopedics;  Laterality: N/A;  2.5 hrs   [place] N/A 01/28/2015   Procedure: WIDE EXCISION VULVECTOMY;  Surgeon: Thurnell Lose, MD;  Location: [facility];  Service: Gynecology;  Laterality: N/A;   Social History   Occupational History   Occupation: ACCOUNTING    Employer: [employer]  Tobacco Use   Smoking  status: Former    Packs/day: 1.00    Years: 40.00    Pack years: 40.00    Types: Cigarettes    Quit date: 02/05/2007    Years since quitting: 13.9   Smokeless tobacco: Never  Vaping Use   Vaping Use: Never used  Substance and Sexual  Activity   Alcohol use: Yes    Alcohol/week: 0.0 standard drinks    Comment: socially   Drug use: No   Sexual activity: Not on file

## 2020-12-31 DIAGNOSIS — E785 Hyperlipidemia, unspecified: Secondary | ICD-10-CM | POA: Diagnosis not present

## 2020-12-31 DIAGNOSIS — F325 Major depressive disorder, single episode, in full remission: Secondary | ICD-10-CM | POA: Diagnosis not present

## 2020-12-31 DIAGNOSIS — F329 Major depressive disorder, single episode, unspecified: Secondary | ICD-10-CM | POA: Diagnosis not present

## 2020-12-31 DIAGNOSIS — I10 Essential (primary) hypertension: Secondary | ICD-10-CM | POA: Diagnosis not present

## 2020-12-31 DIAGNOSIS — G459 Transient cerebral ischemic attack, unspecified: Secondary | ICD-10-CM | POA: Diagnosis not present

## 2020-12-31 DIAGNOSIS — M810 Age-related osteoporosis without current pathological fracture: Secondary | ICD-10-CM | POA: Diagnosis not present

## 2020-12-31 DIAGNOSIS — K219 Gastro-esophageal reflux disease without esophagitis: Secondary | ICD-10-CM | POA: Diagnosis not present

## 2020-12-31 DIAGNOSIS — N1831 Chronic kidney disease, stage 3a: Secondary | ICD-10-CM | POA: Diagnosis not present

## 2021-01-08 DIAGNOSIS — M47816 Spondylosis without myelopathy or radiculopathy, lumbar region: Secondary | ICD-10-CM | POA: Diagnosis not present

## 2021-01-26 ENCOUNTER — Other Ambulatory Visit: Payer: Self-pay | Admitting: Internal Medicine

## 2021-01-28 DIAGNOSIS — N1831 Chronic kidney disease, stage 3a: Secondary | ICD-10-CM | POA: Diagnosis not present

## 2021-01-28 DIAGNOSIS — K219 Gastro-esophageal reflux disease without esophagitis: Secondary | ICD-10-CM | POA: Diagnosis not present

## 2021-01-28 DIAGNOSIS — F329 Major depressive disorder, single episode, unspecified: Secondary | ICD-10-CM | POA: Diagnosis not present

## 2021-01-28 DIAGNOSIS — I10 Essential (primary) hypertension: Secondary | ICD-10-CM | POA: Diagnosis not present

## 2021-01-28 DIAGNOSIS — M810 Age-related osteoporosis without current pathological fracture: Secondary | ICD-10-CM | POA: Diagnosis not present

## 2021-01-28 DIAGNOSIS — E785 Hyperlipidemia, unspecified: Secondary | ICD-10-CM | POA: Diagnosis not present

## 2021-01-28 DIAGNOSIS — G459 Transient cerebral ischemic attack, unspecified: Secondary | ICD-10-CM | POA: Diagnosis not present

## 2021-01-29 ENCOUNTER — Telehealth: Payer: Self-pay | Admitting: Internal Medicine

## 2021-01-29 ENCOUNTER — Other Ambulatory Visit: Payer: Self-pay | Admitting: Internal Medicine

## 2021-01-29 MED ORDER — BENZONATATE 200 MG PO CAPS: ORAL_CAPSULE | ORAL | 0 refills | Status: DC

## 2021-01-29 NOTE — Telephone Encounter (Signed)
LM informing patient refill has been sent in.   Nothing further needed at this time.  

## 2021-02-02 DIAGNOSIS — M47812 Spondylosis without myelopathy or radiculopathy, cervical region: Secondary | ICD-10-CM | POA: Diagnosis not present

## 2021-02-02 DIAGNOSIS — S134XXA Sprain of ligaments of cervical spine, initial encounter: Secondary | ICD-10-CM | POA: Diagnosis not present

## 2021-02-02 DIAGNOSIS — Z87891 Personal history of nicotine dependence: Secondary | ICD-10-CM | POA: Diagnosis not present

## 2021-02-02 DIAGNOSIS — S0990XA Unspecified injury of head, initial encounter: Secondary | ICD-10-CM | POA: Diagnosis not present

## 2021-02-02 DIAGNOSIS — S79911A Unspecified injury of right hip, initial encounter: Secondary | ICD-10-CM | POA: Diagnosis not present

## 2021-02-02 DIAGNOSIS — R42 Dizziness and giddiness: Secondary | ICD-10-CM | POA: Diagnosis not present

## 2021-02-02 DIAGNOSIS — M4312 Spondylolisthesis, cervical region: Secondary | ICD-10-CM | POA: Diagnosis not present

## 2021-02-02 DIAGNOSIS — S199XXA Unspecified injury of neck, initial encounter: Secondary | ICD-10-CM | POA: Diagnosis not present

## 2021-02-02 DIAGNOSIS — S139XXA Sprain of joints and ligaments of unspecified parts of neck, initial encounter: Secondary | ICD-10-CM | POA: Diagnosis not present

## 2021-02-02 DIAGNOSIS — S7001XA Contusion of right hip, initial encounter: Secondary | ICD-10-CM | POA: Diagnosis not present

## 2021-02-02 DIAGNOSIS — H539 Unspecified visual disturbance: Secondary | ICD-10-CM | POA: Diagnosis not present

## 2021-02-03 DIAGNOSIS — S79911A Unspecified injury of right hip, initial encounter: Secondary | ICD-10-CM | POA: Diagnosis not present

## 2021-02-09 ENCOUNTER — Ambulatory Visit: Payer: Medicare Other | Admitting: Neurology

## 2021-02-16 ENCOUNTER — Ambulatory Visit: Payer: Medicare Other | Admitting: Neurology

## 2021-02-19 ENCOUNTER — Other Ambulatory Visit: Payer: Self-pay | Admitting: Internal Medicine

## 2021-02-23 ENCOUNTER — Other Ambulatory Visit: Payer: Self-pay

## 2021-02-23 MED ORDER — PANTOPRAZOLE SODIUM 40 MG PO TBEC: DELAYED_RELEASE_TABLET | ORAL | 2 refills | Status: DC

## 2021-03-02 ENCOUNTER — Other Ambulatory Visit: Payer: Self-pay

## 2021-03-02 ENCOUNTER — Ambulatory Visit (INDEPENDENT_AMBULATORY_CARE_PROVIDER_SITE_OTHER): Payer: Medicare Other | Admitting: Neurology

## 2021-03-02 ENCOUNTER — Encounter: Payer: Self-pay | Admitting: Neurology

## 2021-03-02 VITALS — BP 124/66 | HR 88 | Ht 66.5 in | Wt 178.4 lb

## 2021-03-02 DIAGNOSIS — G3184 Mild cognitive impairment, so stated: Secondary | ICD-10-CM | POA: Diagnosis not present

## 2021-03-02 DIAGNOSIS — I2584 Coronary atherosclerosis due to calcified coronary lesion: Secondary | ICD-10-CM

## 2021-03-02 DIAGNOSIS — I2510 Atherosclerotic heart disease of native coronary artery without angina pectoris: Secondary | ICD-10-CM

## 2021-03-02 NOTE — Progress Notes (Signed)
Reason for visit: Mild cognitive impairment  Shelly [name] is an 70 y.o. female  History of present illness:  Shelly [name] is a 70 year old right-handed white female with a history of a mild memory disorder.  The patient has not given up any activities of daily living because of this.  She at times will have some difficulty remembering directions to areas that she has been before.  She manages her medications, appointments, and finances quite well.  She reports a short-term memory issue, sometimes she will walk into a room and cannot remember why she is there.  She may have some word finding difficulty, and difficulty remembering names for things.  The patient usually sleeps fairly well.  She does have some spine pain, she has a spinal stimulator in place.  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 Sharol Given for this.  She has had multiple falls recently, most of them have been related to prosthetic failure or tripping over something.  She returns to the office today for further evaluation.  Past Medical History:  Diagnosis Date   Anxiety    Arthritis    Asthma    Avascular necrosis of talus ([place])    Cancer ([place])    vulva pre cancer had surgery   Depression    Gait abnormality 03/20/2017   GERD (gastroesophageal reflux disease)    Hearing loss    "very minor"   History of pneumonia    Hyperlipidemia    Hypertension    Leg edema    left   Memory difficulty 03/20/2017   PONV (postoperative nausea and vomiting)    Stress incontinence     Past Surgical History:  Procedure Laterality Date   ANKLE FUSION  2011   right multiple    ANKLE FUSION     rear ankle fusion   ANKLE SURGERY  2010   right cordicompression   AORTIC ARCH ANGIOGRAPHY N/A 12/11/2017   Procedure: AORTIC ARCH ANGIOGRAPHY;  Surgeon: Elam Dutch, MD;  Location: [facility];  Service: Cardiovascular;  Laterality: N/A;   APPENDECTOMY   05/10/2016   BELOW KNEE LEG AMPUTATION     CATARACT EXTRACTION W/ INTRAOCULAR LENS IMPLANT Left    LAPAROSCOPIC APPENDECTOMY N/A 05/10/2016   Procedure: APPENDECTOMY LAPAROSCOPIC;  Surgeon: Coralie Keens, MD;  Location: [facility];  Service: General;  Laterality: N/A;   LUMBAR LAMINECTOMY/DECOMPRESSION MICRODISCECTOMY N/A 09/09/2015   Procedure:  L4-S1 Decompression/ Discetomy;  Surgeon: Melina Schools, MD;  Location: [facility];  Service: Orthopedics;  Laterality: N/A;   SKIN GRAFT     SPINAL CORD STIMULATOR INSERTION N/A 01/10/2019   Procedure: LUMBAR SPINAL CORD STIMULATOR INSERTION;  Surgeon: Melina Schools, MD;  Location: [facility];  Service: Orthopedics;  Laterality: N/A;  2.5 hrs   [place] N/A 01/28/2015   Procedure: WIDE EXCISION VULVECTOMY;  Surgeon: Thurnell Lose, MD;  Location: [facility];  Service: Gynecology;  Laterality: N/A;    Family History  Problem Relation Age of Onset   Dementia Mother 68       alive   Fibromyalgia Mother    Allergies Mother    Heart attack Father 29       deceased   Multiple myeloma Father    Heart murmur Sister 52       she had open heart surgery   Other Sister  26       She is bIpolar- diabetic   Breast cancer Sister    Other Brother        alive   Heart disease Brother        emergent CABG for 99% blocked CAD    Social history:  reports that she quit smoking about 14 years ago. Her smoking use included cigarettes. She has a 40.00 pack-year smoking history. She has never used smokeless tobacco. She reports current alcohol use. She reports that she does not use drugs.   No Known Allergies  Medications:  Prior to Admission medications   Medication Sig Start Date End Date Taking? Authorizing Provider  acidophilus (RISAQUAD) CAPS capsule Take 1 capsule by mouth daily.    [provider]  aspirin 81 MG EC tablet TAKE 1 TABLET(81 MG) BY MOUTH DAILY 10/01/20   Jettie Booze, MD  b complex vitamins  capsule Take 1 capsule by mouth daily.    [provider]  benzonatate (TESSALON) 200 MG capsule TAKE 1 CAPSULE(200 MG) BY MOUTH THREE TIMES DAILY AS NEEDED FOR COUGH 01/29/21   Martyn Ehrich, NP  Biotin 5000 MCG TABS Take 10,000 mcg by mouth every evening.    [provider]  budesonide-formoterol (SYMBICORT) 80-4.5 MCG/ACT inhaler Inhale 2 puffs into the lungs 2 (two) times daily. 03/05/20   Tanda Rockers, MD  buPROPion (WELLBUTRIN XL) 300 MG 24 hr tablet Take 300 mg by mouth daily.    [provider]  chlorpheniramine (CHLOR-TRIMETON) 4 MG tablet Take 8 mg by mouth 2 (two) times a day.     [provider]  Cholecalciferol (VITAMIN D PO) Take 5,000 Units by mouth daily.    [provider]  Cholecalciferol (VITAMIN D-3) 125 MCG (5000 UT) TABS Take by mouth daily.    [provider]  Cyanocobalamin (B-12 PO) Take 5,000 mcg by mouth daily.     [provider]  denosumab (PROLIA) 60 MG/ML SOLN injection Inject 60 mg into the skin every 6 (six) months.     [provider]  desonide (DESOWEN) 0.05 % cream APPLY TO AFFECTED AREA TWICE A DAY 02/22/19   [provider]  doxycycline (VIBRA-TABS) 100 MG tablet Take 1 tablet (100 mg total) by mouth 2 (two) times daily. 09/16/20   Martyn Ehrich, NP  FLUoxetine (PROZAC) 20 MG capsule Take 20 mg by mouth daily.    [provider]  Folic Acid-Cholecalciferol 06-4998 MG-UNIT TABS Take by mouth.    [provider]  L-Lysine 1000 MG TABS Take by mouth.    [provider]  losartan (COZAAR) 50 MG tablet Take 50 mg by mouth daily.  09/12/18   [provider]  MAGNESIUM GLYCINATE PO Take by mouth.    [provider]  methocarbamol (ROBAXIN) 500 MG tablet Take 500 mg by mouth every 8 (eight) hours as needed. 06/10/19   [provider]  montelukast (SINGULAIR) 10 MG tablet Take 1 tablet (10 mg total) by mouth at bedtime. 06/30/14    Tanda Rockers, MD  Multiple Vitamins-Minerals (CENTRUM SILVER PO) Take 1 tablet by mouth daily.    [provider]  NYSTATIN powder Apply 1 application topically 2 (two) times daily as needed. 04/26/19   [provider]  pantoprazole (PROTONIX) 40 MG tablet TAKE 1 TABLET(40 MG) BY MOUTH DAILY 30 TO 60 MINUTES BEFORE FIRST MEAL OF THE DAY 02/23/21   Tanda Rockers, MD  Potassium 99 MG  TABS Take by mouth.    [provider]  predniSONE (DELTASONE) 10 MG tablet Take 4 tabs po daily x 2 days; then 3 tabs for 2 days; then 2 tabs for 2 days; then 1 tab for 2 days 09/16/20   Martyn Ehrich, NP  rosuvastatin (CRESTOR) 10 MG tablet Take 10 mg by mouth every evening.  10/18/17   [provider]  traMADol (ULTRAM) 50 MG tablet Take 50 mg by mouth 3 (three) times daily as needed. 09/03/20   [provider]  valACYclovir (VALTREX) 1000 MG tablet Take 1,000 mg by mouth daily as needed.  06/09/09   [provider]    ROS:  Out of a complete 14 system review of symptoms, the patient complains only of the following symptoms, and all other reviewed systems are negative.  Memory problems Spine pain Right stump pain Balance problems  Blood pressure 124/66, pulse 88, height 5' 6.5" (1.689 m), weight 178 lb 6.4 oz (80.9 kg).  Physical Exam  General: The patient is alert and cooperative at the time of the examination.  Skin: No significant peripheral edema is noted.   Neurologic Exam  Mental status: The patient is alert and oriented x 3 at the time of the examination. The patient has apparent normal recent and remote memory, with an apparently normal attention span and concentration ability.  The MoCA evaluation today reveals a total score 25/30.   Cranial nerves: Facial symmetry is present. Speech is normal, no aphasia or dysarthria is noted. Extraocular movements are full. Visual fields are full.  Motor: The patient has good strength in all 4  extremities.  The patient has a right lower extremity prosthesis.  Sensory examination: Soft touch sensation is symmetric on the face, arms, and legs.  Coordination: The patient has good finger-nose-finger and heel-to-shin bilaterally.  Gait and station: The patient has a slightly wide-based gait, the patient can walk independently. Tandem gait is was not tested Romberg is negative. No drift is seen.  Reflexes: Deep tendon reflexes are symmetric.   CT head 07/10/20:   IMPRESSION: Unremarkable CT scan of the head without contrast showing only mild age-related changes of small vessel disease.  No acute abnormalities.  * CT scan images were reviewed online. I agree with the written report.    Assessment/Plan:  1.  Mild cognitive impairment  The patient will be set up for a speech therapy evaluation for cognitive training.  She overall is doing well with the MoCA evaluation, her score today was within normal range.  The patient be followed for her memory.  She will follow-up here in 8 months, in the future she can be followed through Dr. Brett Fairy.  Shelly Alexanders MD 03/02/2021 7:31 AM  [facility] [address] [place], [place] [address]  Phone [phone] Fax [phone]

## 2021-03-04 DIAGNOSIS — K219 Gastro-esophageal reflux disease without esophagitis: Secondary | ICD-10-CM | POA: Diagnosis not present

## 2021-03-04 DIAGNOSIS — Z1389 Encounter for screening for other disorder: Secondary | ICD-10-CM | POA: Diagnosis not present

## 2021-03-04 DIAGNOSIS — N3281 Overactive bladder: Secondary | ICD-10-CM | POA: Diagnosis not present

## 2021-03-04 DIAGNOSIS — N1831 Chronic kidney disease, stage 3a: Secondary | ICD-10-CM | POA: Diagnosis not present

## 2021-03-04 DIAGNOSIS — I10 Essential (primary) hypertension: Secondary | ICD-10-CM | POA: Diagnosis not present

## 2021-03-04 DIAGNOSIS — Z23 Encounter for immunization: Secondary | ICD-10-CM | POA: Diagnosis not present

## 2021-03-04 DIAGNOSIS — Z89511 Acquired absence of right leg below knee: Secondary | ICD-10-CM | POA: Diagnosis not present

## 2021-03-04 DIAGNOSIS — E785 Hyperlipidemia, unspecified: Secondary | ICD-10-CM | POA: Diagnosis not present

## 2021-03-04 DIAGNOSIS — F325 Major depressive disorder, single episode, in full remission: Secondary | ICD-10-CM | POA: Diagnosis not present

## 2021-03-04 DIAGNOSIS — Z0000 Encounter for general adult medical examination without abnormal findings: Secondary | ICD-10-CM | POA: Diagnosis not present

## 2021-03-19 ENCOUNTER — Ambulatory Visit: Payer: Medicare Other | Admitting: Internal Medicine

## 2021-03-22 ENCOUNTER — Encounter: Payer: Self-pay | Admitting: Internal Medicine

## 2021-03-22 ENCOUNTER — Other Ambulatory Visit: Payer: Self-pay

## 2021-03-22 ENCOUNTER — Ambulatory Visit (INDEPENDENT_AMBULATORY_CARE_PROVIDER_SITE_OTHER): Payer: Medicare Other | Admitting: Internal Medicine

## 2021-03-22 DIAGNOSIS — J45991 Cough variant asthma: Secondary | ICD-10-CM

## 2021-03-22 DIAGNOSIS — I2584 Coronary atherosclerosis due to calcified coronary lesion: Secondary | ICD-10-CM

## 2021-03-22 DIAGNOSIS — I2510 Atherosclerotic heart disease of native coronary artery without angina pectoris: Secondary | ICD-10-CM | POA: Diagnosis not present

## 2021-03-22 MED ORDER — BENZONATATE 200 MG PO CAPS: ORAL_CAPSULE | ORAL | 2 refills | Status: DC

## 2021-03-22 NOTE — Patient Instructions (Addendum)
If cough worse >>> Add either  a dose of prilosec otc 30 min before or pepcid 20 mg after supper    Please schedule a follow up visit in 12 months but call sooner if needed

## 2021-03-22 NOTE — Progress Notes (Signed)
Subjective:    Patient ID: Shelly [name], female    DOB: 1950/07/08       MRN: [id number]    Brief patient profile:  34 yowf mother  of optimetrist  quit smoking 2008  watery eyes in her 3s year round ? Worse in winter eventually started on zyrtec 2013 and seemed to help s assoc cough/ wheeze then new onset cough Aug 2015 and proved refractory so referred to pulmonary clinic 05/15/14  by Dr Jeremy Johann    History of Present Illness  05/15/2014 1st [facility] office visit/ Tinisha Etzkorn   Chief Complaint  Patient presents with   Pulmonary Consult    Referred by Dr. Carol Ada. Pt c/o cough since August 2015- cough is non prod and somtimes seems worse at night. She also c/o DOE with walking up stairs.    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   Prednisone 10 mg take  4 each am x 2 days,   2 each am x 2 days,  1 each am x 2 days and stop (this is to eliminate allergies and inflammation from coughing) Omeprazole 40 mg  Take 30-60 min before first meal of the day and Pepcid 20 mg one bedtime plus chlorpheniramine 4 mg x 2 at bedtime  GERD diet reviewed     05/28/2014 f/u ov/Alex Leahy re: cough since aug 2015 Chief Complaint  Patient presents with   Acute Visit    Pt states that after last visit her cough had pretty much resolved, but then started back about 1 wk ago.  Cough is non prod and "comes and goes".   95% better, still urge to  clear throat daytime on above rx/ confused about when to d/c what  No noct cough  at all  Sob doe x steps better Then gradually worse x one week , again dry and variable   Kouffman Reflux v Neurogenic Cough Differentiator Reflux Comments  Do you awaken from a sound sleep coughing violently?                            With trouble breathing? Not now   Do you have choking episodes when you cannot  Get enough air, gasping for air ?              Not now   Do you usually cough when you lie down into  The bed, or when you just lie down to rest ?                          Not now    Do you usually cough after meals or eating?         No   Do you cough when (or after) you bend over?    Still some  GERD SCORE     Kouffman Reflux v Neurogenic Cough Differentiator Neurogenic   Do you more-or-less cough all day long? no   Does change of temperature make you cough? no   Does laughing or chuckling cause you to cough? Still some   Do fumes (perfume, automobile fumes, burned  Toast, etc.,) cause you to cough ?      no   Does speaking, singing, or talking on the phone cause you to cough   ?               Not now   Neurogenic/Airway score       rec Prednisone 10 mg take  4 each am x 2 days,   2 each am x 2 days,  1 each am x 2 days and stop  Take Tessalon 4x daily  and supplement if needed with  tramadol 50 mg up to 2 every 4 hours to suppress the urge to cough. Swallowing water or using ice chips/non mint and menthol containing candies (such as lifesavers or sugarless jolly ranchers) are also effective.  You should rest your voice and avoid activities that you know make you cough. Once you have eliminated the cough for 3 straight days try reducing the tramadol first,  then the tessalon as tolerated Please see patient coordinator before you leave today  to schedule sinus and chest CT and fluoro > neg       10/18/2018   Pulmonary/ re-estabish/Monte Zinni re:  Tendency to cough sporadic pattern x years,  recurrent since March 2020 while on singulair  Chief Complaint  Patient presents with   Pulmonary Consult    Self referral.  Pt states has had increased cough x 2 months.  Cough is currently non prod. She has not had to use her proair in the past wk.     was maint on singulair,  h1 x 2 hs and no gerd rx then acutely worse x 2 months s obvious trigger Typically year round pattern is that every few weeks the cough starts back up  Dyspnea:  MMRC1 = even on best edays  can walk nl pace, flat grade, can't hurry or go uphills or steps s sob   Cough: mostly dry / gag ? Sometime vomit when cough is severe and "almost pass out"  Sleeping: bed is flat / cough was bad at night better now  SABA use: saba helped as does prednisone  02: 02  Using lots of mints  acei intol ? When  d/c'd (started summer 2019)  rec Plan A = Automatic =Dulera 100 Take 2 puffs first thing in am and then another 2 puffs about 12 hours later and continue singulair 10 mg each pm  Work on inhaler technique:   Plan B = Backup Only use your albuterol inhaler as a rescue medication  Whenever cough start  prilosec 20 mg Take 30- 60 min before your first and last meals of the day until no longer coughing and  For drainage / throat tickle try take CHLORPHENIRAMINE  4 mg  (Chlortab 4mg   at [employer] should be easiest to find in the green box)  take one every 4 hours as needed - available over the counter- may cause drowsiness so start with just a bedtime dose or two and see how you tolerate it before trying in daytime      12/12/2018  f/u ov/Yajayra Feldt re:  Cough resolved on dulera 100 > symb 80 /4.5 plus singulair plus prn  h1   Chief Complaint  Patient presents with   Follow-up    Breathing is doing well and no new co's today. She has not been using her albuterol inhaler.   Dyspnea:  MMRC1 = can walk nl pace, flat grade, can't hurry or go uphills or steps s sob   Cough: none Sleeping: no resp symptoms  SABA use: none 02: none   rec Please remember to go to the  x-ray department  for your tests - we will call you with the results when they are available . No change in medication - ok to stop singulair after surgery > never stopped  Remember for any flare of cough > add back nexium 20mg  Take 30- 60 min before your first and last meals of the day x until better for at least a week    03/25/2019  f/u ov/Terrye Dombrosky re: cough variant asthma vs uacs on symb 80 2bid /singulair chlorphiramine flared p ET for back surgery  > assoc with ST  Chief Complaint  Patient presents with   Follow-up    Increased cough x 10 days- non prod. She also has noticed minimal wheezing x 2 days.    Dyspnea:  Not limited by breathing from desired activities   Cough: dry, worse around 5 pm but does not wake her up noct, worse with laughter Sleeping: on side /bed flat better p cough drop hs and no noct cough  SABA use: none  02: none  rec Protonix Take 30-60 min before first meal of the day and nexium 20 mg Take 30-60 min before last meal of the day  Continue  Chlorpheniramine up to 2 x 4 mg every 4 hours to eliminate sense of pnds  GERD  To suppress urge to cough/ clear throat  > tessalon 200 mg up to 4 x daily         11/14/2019  f/u ov/Cindie Rajagopalan re: uacs vs cough variant asthma flared while on pantoprarzole / h1 bid and singulair and no symb or pm nexium  But added these back p onset of cough 2 weeks prior to OV  And no better  Chief Complaint  Patient presents with   Follow-up    coughing more 2 weeks  acute routine : start back on nexium/ symbicrt 80 2bid/ tesssilon  Dyspnea:  Only if coughing  Cough: very harsh to point of presyncope /  Sleeping on side/ bed is flat/ and one head pillow SABA use: none  02: none  No tramadol x 2 weeks  Rec Take delsym two tsp every 12 hours and supplement if needed with  tramadol 50 mg up to 1-2 every 4 hours to suppress the urge to cough  Hold prozac if taking high doses of tramadol  GERD diet  reviewed, bed blocks rec   If not better >>  Prednisone 10 mg take  4 each am x 2 days,   2 each am x 2 days,  1 each am x 2 days and stop    03/22/2021  f/u ov/Shantell Belongia re: cough variant asthma/ uacs  maint on ppi  ac and symbicort 80 prn Chief Complaint  Patient presents with   Follow-up    Sob: during exertion. Mostly the throat clearing has been bothering patient.   Dyspnea:  MMRC1 = can walk nl pace, flat grade, can't hurry or go uphills or steps s sob   Cough: wakes up then starts clearing throat  but no excess mucus  Sleeping: no resp  cc  SABA use: none  02: none  Covid status:   vax x 2    No obvious day to day or daytime variability or assoc excess/ purulent sputum or mucus plugs or hemoptysis or cp or chest tightness, subjective wheeze or overt sinus or hb symptoms.   Most nights sleeping ok now  without nocturnal  or early am exacerbation  of respiratory  c/o's or need for noct saba. Also denies any obvious fluctuation of symptoms with weather or environmental changes or other aggravating or alleviating factors except as outlined above   No unusual exposure hx or h/o childhood pna/ asthma or knowledge of premature birth.  Current Allergies, Complete Past Medical History, Past Surgical History, Family History, and Social History were reviewed in [employer] record.  ROS  The following are not active complaints unless bolded Hoarseness, sore throat, dysphagia, dental problems, itching, sneezing,  nasal congestion or discharge of excess mucus or purulent secretions, ear ache,   fever, chills, sweats, unintended wt loss or wt gain, classically pleuritic or exertional cp,  orthopnea pnd or arm/hand swelling  or leg swelling, presyncope, palpitations, abdominal pain, anorexia, nausea, vomiting, diarrhea  or change in bowel habits or change in bladder habits, change in stools or change in urine, dysuria, hematuria,  rash, arthralgias, visual complaints, headache,  numbness, weakness or ataxia or problems with walking or coordination,  change in mood or  memory.        Current Meds  Medication Sig   acidophilus (RISAQUAD) CAPS capsule Take 1 capsule by mouth daily.   aspirin 81 MG EC tablet TAKE 1 TABLET(81 MG) BY MOUTH DAILY   benzonatate (TESSALON) 200 MG capsule TAKE 1 CAPSULE(200 MG) BY MOUTH THREE TIMES DAILY AS NEEDED FOR COUGH   Biotin 5000 MCG TABS Take 10,000 mcg by mouth every evening.   budesonide-formoterol (SYMBICORT) 80-4.5 MCG/ACT inhaler Inhale 2 puffs into the lungs 2 (two) times daily.   buPROPion (WELLBUTRIN XL) 300 MG 24 hr tablet Take 300 mg by mouth daily.   Calcium Carbonate (CALCIUM 600 PO) Take by mouth daily.   chlorpheniramine (CHLOR-TRIMETON) 4 MG tablet Take 8 mg by mouth 2 (two) times a day.    Cholecalciferol (VITAMIN D-3) 125 MCG (5000 UT) TABS Take by mouth daily.   Cyanocobalamin (B-12 PO) Take 5,000 mcg by mouth daily.    denosumab (PROLIA) 60 MG/ML SOLN injection Inject 60 mg into the skin every 6 (six) months.    FLUoxetine (PROZAC) 20 MG capsule Take 20 mg by mouth daily.   losartan (COZAAR) 50 MG tablet Take 50 mg by mouth daily.    MAGNESIUM GLYCINATE PO Take by mouth.   methocarbamol (ROBAXIN) 500 MG tablet Take 500 mg by mouth every 8 (eight) hours as needed.   montelukast (SINGULAIR) 10 MG tablet Take 1 tablet (10 mg total) by mouth at bedtime.   Multiple Vitamins-Minerals (CENTRUM SILVER PO) Take 1 tablet by mouth daily.   pantoprazole (PROTONIX) 40 MG tablet TAKE 1 TABLET(40 MG) BY MOUTH DAILY 30 TO 60 MINUTES BEFORE FIRST MEAL OF THE DAY   Potassium 99 MG TABS Take by mouth.   rosuvastatin (CRESTOR) 10 MG tablet Take 10 mg by mouth every evening.    traMADol (ULTRAM) 50 MG tablet Take 50 mg by mouth 3 (three) times daily as needed.   Turmeric (QC TUMERIC COMPLEX PO) Take 1,400 mg by mouth daily.   valACYclovir (VALTREX) 1000 MG tablet Take 1,000 mg by mouth daily as  needed.                                  Objective:  Physical Exam    03/22/2021      183  11/14/2019      184 03/25/2019     188  12/12/2018         188  05/28/2014      208 >  06/11/14  206 >   06/30/2014 207  > 10/18/2018  187           05/15/14 207 lb (93.895 kg)  04/29/13 193 lb (87.544 kg)  09/08/10 195 lb (88.451 kg)       Vital signs reviewed  03/22/2021  - Note at rest 02 sats  96% on RA   General appearance:    amb pleasant wf, occ throat clearing,  non productive pattern/quality      HEENT : pt wearing mask not removed for exam due to covid -19 concerns.    NECK :  without JVD/Nodes/TM/ nl carotid upstrokes bilaterally   LUNGS: no acc muscle use,  Nl contour chest which is clear to A and P bilaterally without cough on insp or exp maneuvers   CV:  RRR  no s3 or murmur or increase in P2, and no edema   ABD:  soft and nontender with nl inspiratory excursion in the supine position. No bruits or organomegaly appreciated, bowel sounds nl  MS:  Nl gait/ ext warm with   R BKA prosthesis,  s  calf tenderness, cyanosis or clubbing No obvious joint restrictions   SKIN: warm and dry without lesions    NEURO:  alert, approp, nl sensorium with  no motor or cerebellar deficits apparent.      Assessment & Plan:

## 2021-03-23 ENCOUNTER — Encounter: Payer: Self-pay | Admitting: Internal Medicine

## 2021-03-23 NOTE — Assessment & Plan Note (Signed)
Onset Aug 2015 sporadic, severe, not seasonal/ and throat clearing ever since  - allergy profile 05/28/2014 > No eos, IgE 424 POS RAST only dust and mold  -  Sinus/chest  CT 06/03/2014 > wnl  - Trial of singulair 06/30/2014 >>>  ? Benefit  - 2019 proved intolerant of ACEi  - 10/18/2018  After extensive coaching inhaler device,  effectiveness =  75%> try dulera 100 2bid and 1st gen H1 blockers per guidelines    no mints > 100% resolution s gerd rx needed  - 05/10/2019 recurrence on symb 80/off ppi p [employer] > restart with just tessalon prn  - recurred late may 2020 while maint on  pantoprarzole / h1 bid and singulair - 01/20/20 started back on symb 80 2bid as maint x one month  > only took a week and "all better"  - 03/05/2020 reported cough /throat clearing stopped after one week of symbicort (but also on tramadol), continued ppi bid and remained cough free so rec if cough recurs take symb 80 2bid s tramadol and continue ppi bid/ diet x 3 months then ov   Still with daytime throat clearing but doing much better than previous rx so only rec is to add either ppi ac supper or pepcid 20 mg p supper in even of flare of cough and continue symbicort prn Based on two studies from NEJM  378; 20 p 1865 (2018) and 380 : p2020-30 (2019) in pts with mild asthma it is reasonable to use low dose symbicort eg 80 2bid "prn" flare in this setting but I emphasized this was only shown with symbicort and takes advantage of the rapid onset of action but is not the same as "rescue therapy" but can be stopped once the acute symptoms have resolved and the need for rescue has been minimized (< 2 x weekly)     F/u can be yearly, call sooner if needed         Each maintenance medication was reviewed in detail including emphasizing most importantly the difference between maintenance and prns and under what circumstances the prns are to be triggered using an action plan format where appropriate.  Total time for H and P,  chart review, counseling, reviewing hfa device(s) and generating customized AVS unique to this office visit / same day charting = 22 min

## 2021-04-01 ENCOUNTER — Other Ambulatory Visit: Payer: Self-pay

## 2021-04-01 ENCOUNTER — Ambulatory Visit (INDEPENDENT_AMBULATORY_CARE_PROVIDER_SITE_OTHER): Payer: Medicare Other

## 2021-04-01 ENCOUNTER — Encounter: Payer: Self-pay | Admitting: Podiatry

## 2021-04-01 ENCOUNTER — Ambulatory Visit (INDEPENDENT_AMBULATORY_CARE_PROVIDER_SITE_OTHER): Payer: Medicare Other | Admitting: Podiatry

## 2021-04-01 DIAGNOSIS — L603 Nail dystrophy: Secondary | ICD-10-CM | POA: Diagnosis not present

## 2021-04-01 DIAGNOSIS — M722 Plantar fascial fibromatosis: Secondary | ICD-10-CM

## 2021-04-01 DIAGNOSIS — L608 Other nail disorders: Secondary | ICD-10-CM | POA: Diagnosis not present

## 2021-04-01 NOTE — Progress Notes (Signed)
  Subjective:  Patient ID: Shelly [name], female    DOB: January 04, 1951,  MRN: [id number]  Chief Complaint  Patient presents with   Nail Problem    Painful, possible ingrowing toenails, left foot    70 y.o. female presents with the above complaint. History confirmed with patient.  She has difficulty cutting her toenails on the left side.  She has a history of a below-knee amputation on the right side, she had multiple reconstructive surgeries on her ankle at [facility] which ultimately were not successful and she elected for amputation.  Wears a prosthetic.  She also has a painful lump or mass on the bottom of the left foot.  An orthopedic surgeon told her the bone was collapsing and that she could try shaving down the callus  Objective:  Physical Exam: warm, good capillary refill, no trophic changes or ulcerative lesions, normal DP and PT pulses, and normal sensory exam. Left Foot: Elongated thickened toenails, soft tissue mass plantar midfoot firm to palpation   Radiographs: Multiple views x-ray of the left foot: No osseous abnormalities in the area of concern there is prominence of the cuboid and fifth met base Assessment:   1. Plantar fibromatosis   2. Nail dystrophy      Plan:  Patient was evaluated and treated and all questions answered.  Trimmed her nails in length and thickness using a sharp nail nipper today.  Discussed with her qualifying conditions for routine nail debridement or trimming.  I recommended we evaluate her for onychomycosis as there is quite a bit of thickening of the nails.  I took a sample of the nails to be sent to [facility] pathology.  We will follow-up on this at her next visit  The mass she has on her plantar left foot is not osseous in etiology and I suspect is a plantar fibroma.  Less likely a plantar bursa causing bursitis and pain.  I recommended corticosteroid injection after we discussed the etiology and treatment options for both lesions.  Following  sterile prep with alcohol from a lateral approach I injected 10 mg of Kenalog and 4 mg of dexamethasone with 1 cc of lidocaine.  She tolerated the procedure well.  Post care instructions given.  Dressed with Band-Aid.  Follow-up in 1 month  Return in about 1 month (around 05/02/2021) for fibroma follow up / nail biopsy results .

## 2021-04-02 DIAGNOSIS — M47816 Spondylosis without myelopathy or radiculopathy, lumbar region: Secondary | ICD-10-CM | POA: Diagnosis not present

## 2021-04-02 DIAGNOSIS — M47896 Other spondylosis, lumbar region: Secondary | ICD-10-CM | POA: Diagnosis not present

## 2021-04-06 ENCOUNTER — Telehealth: Payer: Self-pay | Admitting: Interventional Cardiology

## 2021-04-06 DIAGNOSIS — I771 Stricture of artery: Secondary | ICD-10-CM

## 2021-04-06 DIAGNOSIS — R42 Dizziness and giddiness: Secondary | ICD-10-CM

## 2021-04-06 NOTE — Telephone Encounter (Signed)
Make sure she is checking BP in the both arms to see if there is a difference.  SHe had a mild right subclavian stenosis in 2019.       Stay hydrated.  Avoid low blood sugar.  Check carotid DOppler to see if there is suggestion of subclavian steal.

## 2021-04-06 NOTE — Telephone Encounter (Signed)
Patient states over the past 3 weeks she has experienced 2-3 dizzy spells in which she becomes extremely faint and weak. When these have occurred she becomes flushed, feels palpitations in her chest and shortness of breath. This morning she was in bed and started to get up when this she had presyncope. The other times sitting at computer and walking from one room to another. No lifestyle changes. No missed doses of medications. Advised hydration. Educated about fall prevention. ED precautions given.   Vital Signs 04/06/21  Laying: 137/82 81   Sitting: 123/73 88  Standing: 125/71 91  Standing after 3 mins: 130/82 91  Will forward to Dr. Irish Lack for advisement.

## 2021-04-06 NOTE — Telephone Encounter (Signed)
STAT if patient feels like he/she is going to faint   Are you dizzy now?  Not currently, but when she woke up this morning she states she felt strange and "light"   Do you feel faint or have you passed out?  Patient becomes faint during episodes, but not currently   Do you have any other symptoms?  Weakness, loss of smell  Have you checked your HR and BP (record if available)?  Patient does not have a daily log of her BP/HR, but she states last week during one of the episodes she check her BP and her systolic was a little elevated at 144. She states her systolic is normally around 100.  Patient states over the past 3 weeks she has experienced 2-3 dizzy spells in which she becomes extremely faint and weak.  She states she has even lost her sense of smell  and felt a wave of heat at times. Episodes last about 15 minutes and she states she feels like she is about to die.

## 2021-04-07 NOTE — Telephone Encounter (Signed)
Gave recommendation to patient. Verbalized agreement and understanding.   Placed order for Carotid Doppler

## 2021-04-09 ENCOUNTER — Ambulatory Visit (HOSPITAL_COMMUNITY)
Admission: RE | Admit: 2021-04-09 | Discharge: 2021-04-09 | Disposition: A | Discharge status: Home / Self Care | Payer: Medicare Other | Source: Ambulatory Visit | Attending: Cardiology | Admitting: Cardiology

## 2021-04-09 ENCOUNTER — Other Ambulatory Visit: Payer: Self-pay

## 2021-04-09 ENCOUNTER — Ambulatory Visit: Payer: Medicare Other | Attending: Neurology

## 2021-04-09 DIAGNOSIS — R41841 Cognitive communication deficit: Secondary | ICD-10-CM

## 2021-04-09 DIAGNOSIS — I771 Stricture of artery: Secondary | ICD-10-CM

## 2021-04-09 DIAGNOSIS — R42 Dizziness and giddiness: Secondary | ICD-10-CM | POA: Diagnosis not present

## 2021-04-09 DIAGNOSIS — R4701 Aphasia: Secondary | ICD-10-CM

## 2021-04-09 NOTE — Therapy (Signed)
[facility] [address] [place], [place], [address] Phone: [phone]   Fax:  [phone]  Speech Language Pathology Evaluation  Patient Details  Name: Shelly [name] MRN: [id number] Date of Birth: May 30, 1951 Referring Provider (SLP): Shelly [name] (ref), Dohmeier, Shelly Partridge MD (documentation)   Encounter Date: 04/09/2021   End of Session - 04/09/21 1322     Visit Number 1    Number of Visits 9    Date for SLP Re-Evaluation 06/11/21    SLP Start Time 0850    SLP Stop Time  0930    SLP Time Calculation (min) 40 min    Activity Tolerance Patient tolerated treatment well             Past Medical History:  Diagnosis Date   Anxiety    Arthritis    Asthma    Avascular necrosis of talus ([place])    Cancer ([place])    vulva pre cancer had surgery   Depression    Gait abnormality 03/20/2017   GERD (gastroesophageal reflux disease)    Hearing loss    "very minor"   History of pneumonia    Hyperlipidemia    Hypertension    Leg edema    left   Memory difficulty 03/20/2017   PONV (postoperative nausea and vomiting)    Stress incontinence     Past Surgical History:  Procedure Laterality Date   ANKLE FUSION  2011   right multiple    ANKLE FUSION     rear ankle fusion   ANKLE SURGERY  2010   right cordicompression   AORTIC ARCH ANGIOGRAPHY N/A 12/11/2017   Procedure: AORTIC ARCH ANGIOGRAPHY;  Surgeon: Shelly Dutch, MD;  Location: [place] [facility];  Service: Cardiovascular;  Laterality: N/A;   APPENDECTOMY  05/10/2016   BELOW KNEE LEG AMPUTATION     CATARACT EXTRACTION W/ INTRAOCULAR LENS IMPLANT Left    LAPAROSCOPIC APPENDECTOMY N/A 05/10/2016   Procedure: APPENDECTOMY LAPAROSCOPIC;  Surgeon: Shelly Keens, MD;  Location: [facility];  Service: General;  Laterality: N/A;   LUMBAR LAMINECTOMY/DECOMPRESSION MICRODISCECTOMY N/A 09/09/2015   Procedure:  L4-S1 Decompression/ Discetomy;  Surgeon: Shelly Schools, MD;   Location: [place];  Service: Orthopedics;  Laterality: N/A;   SKIN GRAFT     SPINAL CORD STIMULATOR INSERTION N/A 01/10/2019   Procedure: LUMBAR SPINAL CORD STIMULATOR INSERTION;  Surgeon: Shelly Schools, MD;  Location: [facility];  Service: Orthopedics;  Laterality: N/A;  2.5 hrs   [place] N/A 01/28/2015   Procedure: WIDE EXCISION VULVECTOMY;  Surgeon: Shelly Lose, MD;  Location: [facility];  Service: Gynecology;  Laterality: N/A;    There were no vitals filed for this visit.   Subjective Assessment - 04/09/21 0851     Subjective "Who sent me, Dr. Jannifer [name] or Dr. Melvyn [name]? I knew I was coming but I didn't know why I was coming. I put it in my calendar."    Currently in Pain? Yes                SLP Evaluation [facility] - 04/09/21 4970       SLP Visit Information   SLP Received On 04/10/21    Referring Provider (SLP) Shelly [name] (ref), Dohmeier, Shelly Partridge MD (documentation)    Medical Diagnosis Mild Cognitive Impairment      Subjective   Subjective "I started seeing Dr. Jannifer [name] about two years ago because I was scared to death  I would have dementia - my mother had dementia and it was ugly."      General Information   HPI Pt began seeing neurologist approx 2 years ago because she started realizing she was having memory difficulty. Dr. Jannifer [name] dx'd mild cognitive impairment. Pt scored 25/30 on MoCA (26/30 is WNL) on 03-02-21. Dr. Jannifer [name] referred for memory compensations.    Mobility Status ambulating independently, Pt has rt prosthetic leg.      Balance Screen   Has the patient fallen in the past 6 months Yes   "I'll call Shelly [name])."   How many times? 4    Has the patient had a decrease in activity level because of a fear of falling?  No    Is the patient reluctant to leave their home because of a fear of falling?  No      Prior Functional Status   Cognitive/Linguistic Baseline Within functional limits    Vocation Retired      Associate Professor   Overall  Cognitive Status Impaired/Different from baseline    Area of Impairment Memory    Memory Comments Pt's score on first and third reps of hopkins verbal learning test were below WNL. Pt's recognition score was WNL.    Behaviors Poor frustration tolerance;Other (comment)   re: anomia - pt told SLP x3 during 40 minute eval that her anomia frustrates her     Auditory Comprehension   Overall Auditory Comprehension Appears within functional limits for tasks assessed      Verbal Expression   Overall Verbal Expression Appears within functional limits for tasks assessed    Naming --   Boston Naming Test-2 score 53/60 (WNL is >43/60)   Other Verbal Expression Comments In conversation anomia today was not noted. However SLP believes pt is experiencing this in conversation as she described some instances that this has occurred.      Oral Motor/Sensory Function   Overall Oral Motor/Sensory Function Appears within functional limits for tasks assessed      Motor Speech   Overall Motor Speech Appears within functional limits for tasks assessed      Standardized Assessments   Standardized Assessments  Boston Naming Test-2nd edition;Other Assessment   Hopkins Verbal Learning Test                            SLP Education - 04/09/21 1321     Education Details possible goals, memory testing with Hopkins mostly WNL and Boston Naming was WNL    Person(s) Educated Patient    Methods Explanation    Comprehension Verbalized understanding              SLP Short Term Goals - 04/09/21 1331       SLP SHORT TERM GOAL #1   Title pt will describe to SLP 3 compensations for word finding in 2 sessions    Time 4    Period Weeks    Status New    Target Date 05/14/21   added 1 week due to holiday     SLP [place] #2   Title pt will complete semantic feature analysis and Verb Network strengthening treatment (VNeST) in session independently    Time 4    Period Weeks    Status New     Target Date 05/14/21      SLP SHORT TERM GOAL #3   Title pt will use 2 memory compensations successfully between 3 sessions  Time 4    Period Weeks    Status New    Target Date 05/14/21      SLP SHORT TERM GOAL #4   Title pt will use anoima compensations successfully PRN in 10 minutes simple/mod complex conversation    Time 4    Period Weeks    Status New    Target Date 05/14/21              SLP Long Term Goals - 04/09/21 1342       SLP LONG TERM GOAL #1   Title pt will use anomia compensations successfully PRN in 15 minutes simple/mod complex conversation x2 sessions    Time 8    Period Weeks    Status New    Target Date 06/11/21      SLP LONG TERM GOAL #2   Title pt will report higher effectiveness of communication via Communication Effectiveness Survey in last 1-2 sessions compared to the first therapy session    Time 8    Period Weeks    Status New    Target Date 06/11/21      SLP LONG TERM GOAL #3   Title pt will use two memory compensations successfully/report successful use of 2 strategies between 5 sessions    Time 8    Period Weeks    Target Date 06/11/21              Plan - 04/09/21 1323     Clinical Impression Statement As above, pt with mostly WNL memory assessment (below WNL on 1/3 free recall reps), and WNL with testing of word-finding. However, pt expresses frustration with anomia in conversation and provided two examples today of this for SLP. Pt also arrived today asking SLP why she was here, stating she knew she had the appointment but did not recall why she was being evaluated nor what MD made the referral. SLP talked with pt about the need to modify her memory compensation of writing down important info. Pt will benefit from skilled ST to learn more effective memory compensations as well as some techniques and tasks to improve her word finding.    Speech Therapy Frequency 1x /week    Duration 8 weeks    Treatment/Interventions  Environmental controls;Functional tasks;Compensatory techniques;Multimodal communcation approach;Cueing hierarchy;Language facilitation;SLP instruction and feedback   memory strategies   Potential to Achieve Goals Good    Consulted and Agree with Plan of Care Patient             Patient will benefit from skilled therapeutic intervention in order to improve the following deficits and impairments:   Aphasia  Cognitive communication deficit    Problem List Patient Active Problem List   Diagnosis Date Noted   Community acquired pneumonia 09/16/2020   Upper airway cough syndrome 03/26/2019   Chronic pain 01/10/2019   MCI (mild cognitive impairment) 03/20/2017   Gait abnormality 03/20/2017   History of right below knee amputation ([place]) 07/11/2016   Adenomatous colon polyp 05/10/2016   Coronary artery calcification 01/07/2016   Essential hypertension 01/07/2016   Back pain 09/09/2015   Abnormal CXR 05/16/2014   Cough variant asthma ? assoc with UACS 05/15/2014   Acute post-operative pain 02/18/2012   Adiposity 02/17/2012   Does use eyeglasses 02/13/2012   H/O arthrodesis 10/21/2011   Fibromyalgia 04/11/2011   HLD (hyperlipidemia) 04/11/2011   Avascular necrosis of talus ([place]) 04/11/2011   Osteomyelitis of ankle ([place]) 04/11/2011   Leg edema 09/08/2010  [facility] ,[place], [place]  04/09/2021, 1:46 PM  [facility] [address] [place], [place], [address] Phone: [phone]   Fax:  [phone]  Name: Tessy Pawelski MRN: [id number] Date of Birth: May 14, 1951

## 2021-04-09 NOTE — Patient Instructions (Signed)
  We will work on compensating for your memory, and I will teach you some strategies for word finding, as well as help you learn some tasks you can do at home to assist word finding skills.

## 2021-04-15 ENCOUNTER — Other Ambulatory Visit: Payer: Self-pay | Admitting: *Deleted

## 2021-04-15 DIAGNOSIS — I771 Stricture of artery: Secondary | ICD-10-CM

## 2021-04-15 DIAGNOSIS — I779 Disorder of arteries and arterioles, unspecified: Secondary | ICD-10-CM

## 2021-04-21 DIAGNOSIS — M47816 Spondylosis without myelopathy or radiculopathy, lumbar region: Secondary | ICD-10-CM | POA: Diagnosis not present

## 2021-04-21 DIAGNOSIS — M545 Low back pain, unspecified: Secondary | ICD-10-CM | POA: Diagnosis not present

## 2021-05-02 ENCOUNTER — Other Ambulatory Visit: Payer: Self-pay | Admitting: Internal Medicine

## 2021-05-06 ENCOUNTER — Ambulatory Visit (INDEPENDENT_AMBULATORY_CARE_PROVIDER_SITE_OTHER): Payer: Medicare Other | Admitting: Podiatry

## 2021-05-06 ENCOUNTER — Other Ambulatory Visit: Payer: Self-pay

## 2021-05-06 DIAGNOSIS — L603 Nail dystrophy: Secondary | ICD-10-CM | POA: Diagnosis not present

## 2021-05-06 DIAGNOSIS — M722 Plantar fascial fibromatosis: Secondary | ICD-10-CM

## 2021-05-06 NOTE — Patient Instructions (Signed)
Look for urea 40% gel and apply to the thickened dry skin / calluses and nails. This can be bought over the counter, at a pharmacy or online such as [employer].

## 2021-05-06 NOTE — Progress Notes (Signed)
  Subjective:  Patient ID: Shelly [name], female    DOB: Mar 15, 1951,  MRN: [id number]  Chief Complaint  Patient presents with   plantar fibroma    fibroma follow up / nail biopsy results .    70 y.o. female presents with the above complaint. History confirmed with patient.  The injection we did last visit worked well for about 24 hours and then her pain returned and is about the same now.  Is more comfortable in shoe gear than barefoot  Objective:  Physical Exam: warm, good capillary refill, no trophic changes or ulcerative lesions, normal DP and PT pulses, and normal sensory exam. Left Foot: Elongated thickened toenails, soft tissue mass plantar midfoot firm to palpation   Radiographs: Multiple views x-ray of the left foot: No osseous abnormalities in the area of concern there is prominence of the cuboid and fifth met base   [place] results with nail dystrophy no onychomycosis Assessment:   1. Nail dystrophy   2. Plantar fibromatosis      Plan:  Patient was evaluated and treated and all questions answered.  Discussed presence of nail dystrophy with her.  Recommended she use urea gel to help thin the skin and calluses around this.  Hopefully we able to have managed by a pedicurist on a regular basis.   Unfortunately not respond well to the injection whether this is a bursa or fibroma it has not improved.  There is some underlying bony pathology as well.  I do not think exostosis of the area be beneficial at this point.  I recommend offloading with orthotics.  She has multiple orthotics from her previous right foot issues on the left foot as well.  She will bring these to me because they were not comfortable do not fit in shoes well.  Hopefully we can either have a new pair fitted or refurbished her old ones to make them more accommodative and fit in her shoes so she will be able to wear them.  I will have her meet with our pedorthist in January for this.  Return if symptoms  worsen or fail to improve.

## 2021-05-07 ENCOUNTER — Ambulatory Visit: Payer: Medicare Other | Attending: Family Medicine

## 2021-05-07 DIAGNOSIS — R4701 Aphasia: Secondary | ICD-10-CM | POA: Insufficient documentation

## 2021-05-07 DIAGNOSIS — R41841 Cognitive communication deficit: Secondary | ICD-10-CM | POA: Insufficient documentation

## 2021-05-07 NOTE — Patient Instructions (Signed)
Diagram of SFA provided

## 2021-05-07 NOTE — Therapy (Signed)
[facility] [address] [place], [place], [address] Phone: [phone]   Fax:  [phone]  Speech Language Pathology Treatment  Patient Details  Name: Shelly [name] MRN: [id number] Date of Birth: 05-31-1951 Referring Provider (SLP): Neldon [place] (ref), Dohmeier, Asencion Partridge MD (documentation)   Encounter Date: 05/07/2021   End of Session - 05/07/21 1706     Visit Number 2    Number of Visits 9    Date for SLP Re-Evaluation 06/11/21    SLP Start Time 1150    SLP Stop Time  1230    SLP Time Calculation (min) 40 min    Activity Tolerance Patient tolerated treatment well             Past Medical History:  Diagnosis Date   Anxiety    Arthritis    Asthma    Avascular necrosis of talus ([place])    Cancer ([place])    vulva pre cancer had surgery   Depression    Gait abnormality 03/20/2017   GERD (gastroesophageal reflux disease)    Hearing loss    "very minor"   History of pneumonia    Hyperlipidemia    Hypertension    Leg edema    left   Memory difficulty 03/20/2017   PONV (postoperative nausea and vomiting)    Stress incontinence     Past Surgical History:  Procedure Laterality Date   ANKLE FUSION  2011   right multiple    ANKLE FUSION     rear ankle fusion   ANKLE SURGERY  2010   right cordicompression   AORTIC ARCH ANGIOGRAPHY N/A 12/11/2017   Procedure: AORTIC ARCH ANGIOGRAPHY;  Surgeon: Elam Dutch, MD;  Location: [facility];  Service: Cardiovascular;  Laterality: N/A;   APPENDECTOMY  05/10/2016   BELOW KNEE LEG AMPUTATION     CATARACT EXTRACTION W/ INTRAOCULAR LENS IMPLANT Left    LAPAROSCOPIC APPENDECTOMY N/A 05/10/2016   Procedure: APPENDECTOMY LAPAROSCOPIC;  Surgeon: Coralie Keens, MD;  Location: [facility];  Service: General;  Laterality: N/A;   LUMBAR LAMINECTOMY/DECOMPRESSION MICRODISCECTOMY N/A 09/09/2015   Procedure:  L4-S1 Decompression/ Discetomy;  Surgeon: Melina Schools, MD;   Location: [facility];  Service: Orthopedics;  Laterality: N/A;   SKIN GRAFT     SPINAL CORD STIMULATOR INSERTION N/A 01/10/2019   Procedure: LUMBAR SPINAL CORD STIMULATOR INSERTION;  Surgeon: Melina Schools, MD;  Location: [facility];  Service: Orthopedics;  Laterality: N/A;  2.5 hrs   [place] N/A 01/28/2015   Procedure: WIDE EXCISION VULVECTOMY;  Surgeon: Thurnell Lose, MD;  Location: [facility];  Service: Gynecology;  Laterality: N/A;    There were no vitals filed for this visit.   Subjective Assessment - 05/07/21 1152     Subjective "I found out that I had run out of (my antidepressant). I thought I was automatic refills."                   ADULT SLP TREATMENT - 05/07/21 1152       General Information   Behavior/Cognition Alert;Cooperative;Pleasant mood      Cognitive-Linquistic Treatment   Treatment focused on Cognition    Skilled Treatment "I am able to be more organized in my house" after refill of antidepressant. Pt became tearful today x2 in describing some of her deficits and concern as to the basis of these deficits. SLP suggested she inqiure about neuropsych testing and provided  the names of 3 of these professionals in the area. She used compensations of writing information down x3 during today's session. SLP introduced semantic feature analysis (SFA) to assist her word finding difficulty.      Assessment / Recommendations / Plan   Plan Continue with current plan of care      Progression Toward Goals   Progression toward goals Progressing toward goals              SLP Education - 05/07/21 1706     Education Details SFA, neuropsych names    Person(s) Educated Patient    Methods Explanation;Handout    Comprehension Verbalized understanding;Returned demonstration;Need further instruction              SLP Short Term Goals - 05/07/21 1708       SLP SHORT TERM GOAL #1   Title pt will describe to SLP 3 compensations for  word finding in 2 sessions    Time 4    Period Weeks    Status On-going    Target Date 05/14/21   added 1 week due to holiday     SLP SHORT TERM GOAL #2   Title pt will complete semantic feature analysis and Verb Network strengthening treatment (VNeST) in session independently    Time 4    Period Weeks    Status On-going    Target Date 05/14/21      SLP SHORT TERM GOAL #3   Title pt will use 2 memory compensations successfully between 3 sessions    Time 4    Period Weeks    Status On-going    Target Date 05/14/21      SLP SHORT TERM GOAL #4   Title pt will use anoima compensations successfully PRN in 10 minutes simple/mod complex conversation    Time 4    Period Weeks    Status On-going    Target Date 05/14/21              SLP Long Term Goals - 05/07/21 1708       SLP LONG TERM GOAL #1   Title pt will use anomia compensations successfully PRN in 15 minutes simple/mod complex conversation x2 sessions    Time 8    Period Weeks    Status On-going    Target Date 06/11/21      SLP LONG TERM GOAL #2   Title pt will report higher effectiveness of communication via Communication Effectiveness Survey in last 1-2 sessions compared to the first therapy session    Time 8    Period Weeks    Status On-going    Target Date 06/11/21      SLP LONG TERM GOAL #3   Title pt will use two memory compensations successfully/report successful use of 2 strategies between 5 sessions    Time 8    Period Weeks    Status On-going    Target Date 06/11/21              Plan - 05/07/21 1706     Clinical Impression Statement However, pt cont to express frustration with deficit areas, becoming tearful x2 today. SLP noted that pt writing down many things during session today to compensate. Pt will benefit from skilled ST to learn more effective memory compensations as well as some techniques and tasks to improve her word finding.    Speech Therapy Frequency 1x /week    Duration 8 weeks     Treatment/Interventions Environmental controls;Functional  tasks;Compensatory techniques;Multimodal communcation approach;Cueing hierarchy;Language facilitation;SLP instruction and feedback   memory strategies   Potential to Achieve Goals Good    Consulted and Agree with Plan of Care Patient             Patient will benefit from skilled therapeutic intervention in order to improve the following deficits and impairments:   Aphasia  Cognitive communication deficit    Problem List Patient Active Problem List   Diagnosis Date Noted   Community acquired pneumonia 09/16/2020   Upper airway cough syndrome 03/26/2019   Chronic pain 01/10/2019   MCI (mild cognitive impairment) 03/20/2017   Gait abnormality 03/20/2017   History of right below knee amputation ([place]) 07/11/2016   Adenomatous colon polyp 05/10/2016   Coronary artery calcification 01/07/2016   Essential hypertension 01/07/2016   Back pain 09/09/2015   Abnormal CXR 05/16/2014   Cough variant asthma ? assoc with UACS 05/15/2014   Acute post-operative pain 02/18/2012   Adiposity 02/17/2012   Does use eyeglasses 02/13/2012   H/O arthrodesis 10/21/2011   Fibromyalgia 04/11/2011   HLD (hyperlipidemia) 04/11/2011   Avascular necrosis of talus ([place]) 04/11/2011   Osteomyelitis of ankle ([place]) 04/11/2011   Leg edema 09/08/2010    Garald Balding, [place] 05/07/2021, 5:09 PM  [facility] [address] [place], [place], [address] Phone: [phone]   Fax:  [phone]   Name: Shelly [name] MRN: [id number] Date of Birth: 04-09-1951

## 2021-05-10 DIAGNOSIS — H04123 Dry eye syndrome of bilateral lacrimal glands: Secondary | ICD-10-CM | POA: Diagnosis not present

## 2021-05-11 DIAGNOSIS — Z20822 Contact with and (suspected) exposure to covid-19: Secondary | ICD-10-CM | POA: Diagnosis not present

## 2021-05-12 DIAGNOSIS — M545 Low back pain, unspecified: Secondary | ICD-10-CM | POA: Diagnosis not present

## 2021-05-12 DIAGNOSIS — R2689 Other abnormalities of gait and mobility: Secondary | ICD-10-CM | POA: Diagnosis not present

## 2021-05-13 ENCOUNTER — Telehealth: Payer: Self-pay | Admitting: Podiatry

## 2021-05-13 NOTE — Telephone Encounter (Signed)
Pt dropped off a couple of her orthotics(left only due to amputee) per Dr Sherryle Lis asking about possibly refurbishing or if she needed new ones. She is scheduled 1.18.23 to see Aaron Edelman but I called pt to see if she would like to come in sooner as Aaron Edelman is full time ans has plenty of openings to get her in this month. She is going to look at her schedule and call me back tomorrow.

## 2021-05-14 ENCOUNTER — Ambulatory Visit: Payer: Medicare Other

## 2021-05-14 ENCOUNTER — Other Ambulatory Visit: Payer: Self-pay

## 2021-05-14 DIAGNOSIS — R41841 Cognitive communication deficit: Secondary | ICD-10-CM | POA: Diagnosis not present

## 2021-05-14 DIAGNOSIS — R4701 Aphasia: Secondary | ICD-10-CM | POA: Diagnosis not present

## 2021-05-14 NOTE — Patient Instructions (Signed)
VNeST technique:   1) Choose a common verb from the list provided.   2) Think of 3 people (or "subjects") who could perform the action, or start by thinking of 3 objects the action could be done to. It might be easier to work on one complete set at a time, thinking of the object and person/subject together. The goal is to be as specific as possible with the nouns, so "farmer" would be a better subject than "man" for the verb drive. Write each subject and each object down on the paper in the appropriate column so you'll have 3 triads (subject-verb-object). It's okay to get personal. Family members, friends, and pets make great subjects for VNeST! Just try to vary the responses, so not all the nouns are personal or there is just one type of object. Similarly, try to use many different meanings of the verb if possible.   3) Read each triad of words aloud when you write them down on the lines below the chart. It's not important to conjugate the verb or add any articles to the nouns ("farmer drive tractor"), but it's okay if you do ("the farmer drives the tractor").   4) Select one of the three triads to expand upon. Ask "WHERE" it happens, "WHY" it happens, and "WHEN" it happens. Try to get as specific as possible, and write the responses down on the last line following the triad you chose to expand. Then, read the expanded sentence with the three answers to the questions. Again, the grammar doesn't matter as much as the focus is on connecting the concepts.

## 2021-05-14 NOTE — Therapy (Signed)
[facility] [address] [place], [place], [address] Phone: [phone]   Fax:  [phone]  Speech Language Pathology Treatment  Patient Details  Name: Shelly [name] MRN: [id number] Date of Birth: 1950-08-23 Referring Provider (SLP): Neldon Newport (ref), Dohmeier, Asencion Partridge MD (documentation)   Encounter Date: 05/14/2021   End of Session - 05/14/21 1701     Visit Number 3    Number of Visits 9    Date for SLP Re-Evaluation 06/11/21    SLP Start Time 1020    SLP Stop Time  1100    SLP Time Calculation (min) 40 min    Activity Tolerance Patient tolerated treatment well             Past Medical History:  Diagnosis Date   Anxiety    Arthritis    Asthma    Avascular necrosis of talus ([place])    Cancer ([place])    vulva pre cancer had surgery   Depression    Gait abnormality 03/20/2017   GERD (gastroesophageal reflux disease)    Hearing loss    "very minor"   History of pneumonia    Hyperlipidemia    Hypertension    Leg edema    left   Memory difficulty 03/20/2017   PONV (postoperative nausea and vomiting)    Stress incontinence     Past Surgical History:  Procedure Laterality Date   ANKLE FUSION  2011   right multiple    ANKLE FUSION     rear ankle fusion   ANKLE SURGERY  2010   right cordicompression   AORTIC ARCH ANGIOGRAPHY N/A 12/11/2017   Procedure: AORTIC ARCH ANGIOGRAPHY;  Surgeon: Elam Dutch, MD;  Location: [facility];  Service: Cardiovascular;  Laterality: N/A;   APPENDECTOMY  05/10/2016   BELOW KNEE LEG AMPUTATION     CATARACT EXTRACTION W/ INTRAOCULAR LENS IMPLANT Left    LAPAROSCOPIC APPENDECTOMY N/A 05/10/2016   Procedure: APPENDECTOMY LAPAROSCOPIC;  Surgeon: Coralie Keens, MD;  Location: [facility];  Service: General;  Laterality: N/A;   LUMBAR LAMINECTOMY/DECOMPRESSION MICRODISCECTOMY N/A 09/09/2015   Procedure:  L4-S1 Decompression/ Discetomy;  Surgeon: Melina Schools, MD;   Location: [facility];  Service: Orthopedics;  Laterality: N/A;   SKIN GRAFT     SPINAL CORD STIMULATOR INSERTION N/A 01/10/2019   Procedure: LUMBAR SPINAL CORD STIMULATOR INSERTION;  Surgeon: Melina Schools, MD;  Location: [facility];  Service: Orthopedics;  Laterality: N/A;  2.5 hrs   [place] N/A 01/28/2015   Procedure: WIDE EXCISION VULVECTOMY;  Surgeon: Thurnell Lose, MD;  Location: [facility];  Service: Gynecology;  Laterality: N/A;    There were no vitals filed for this visit.   Subjective Assessment - 05/14/21 1021     Subjective "I feel like all this has been so, so helpful." (end of session)    Currently in Pain? Yes    Pain Score 3     Pain Location --   torso   Pain Descriptors / Indicators Stabbing;Radiating    Pain Type Chronic pain    Pain Onset More than a month ago    Pain Frequency Constant    Aggravating Factors  walking                   ADULT SLP TREATMENT - 05/14/21 1033       General Information   Behavior/Cognition Alert;Cooperative;Pleasant mood  Cognitive-Linquistic Treatment   Treatment focused on Cognition    Skilled Treatment Pt cont to tell SLP she has seen more improvement this week - (see "S"). Pt is using compensations for memory such as saying the item she is going to retrieve when doing so. She is also using phone to write things down such as people's names which she says is still somewhat challenging. She is using compensations for anomia which she described to SLP. She endorses less difficulty with names of items. SLP told pt the steps to VNeST and she repeated them back to SLP using written notes. SLP introduced pt to verb strengthening treatment today and she took notes and will complete at home.      Assessment / Recommendations / Plan   Plan Continue with current plan of care   pt would like to transfer to [facility] next week     Progression Toward Goals   Progression toward goals Progressing  toward goals                SLP Short Term Goals - 05/14/21 1036       SLP SHORT TERM GOAL #1   Title pt will describe to SLP 3 compensations for word finding in 2 sessions    Baseline 05-14-21    Time 4    Period Weeks    Status Partially Met    Target Date 05/14/21   added 1 week due to holiday     [place] #2   Title pt will complete semantic feature analysis and Verb Network strengthening treatment (VNeST) in session independently    Time 4    Period Weeks    Status Achieved    Target Date 05/14/21      SLP SHORT TERM GOAL #3   Title pt will use 2 memory compensations successfully between 3 sessions    Baseline 05-07-21; 05-14-21    Time 4    Period Weeks    Status Partially Met    Target Date 05/14/21      SLP SHORT TERM GOAL #4   Title pt will use anoima compensations successfully PRN in 10 minutes simple/mod complex conversation    Time 4    Period Weeks    Status Achieved    Target Date 05/14/21              SLP Long Term Goals - 05/14/21 1704       SLP LONG TERM GOAL #1   Title pt will use anomia compensations successfully PRN in 15 minutes simple/mod complex conversation x2 sessions    Baseline 05-14-21    Time 8    Period Weeks    Status On-going    Target Date 06/11/21      SLP LONG TERM GOAL #2   Title pt will report higher effectiveness of communication via Communication Effectiveness Survey in last 1-2 sessions compared to the first therapy session    Time 8    Period Weeks    Status On-going    Target Date 06/11/21      SLP LONG TERM GOAL #3   Title pt will use two memory compensations successfully/report successful use of 2 strategies between 5 sessions    Baseline 05-14-21    Time 8    Period Weeks    Status On-going    Target Date 06/11/21              Plan - 05/14/21 1701  Clinical Impression Statement Pt was not tearful today. SLP noted that pt cont to write things down today to compensate. She states  compensations discussed in previous sessions have been helpful to her this past week. She was shown VNeST today, and will perform this at home regularly. Pt will benefit from skilled ST to learn more effective memory compensations as well as some techniques and tasks to improve her word finding.    Speech Therapy Frequency 1x /week    Duration 8 weeks    Treatment/Interventions Environmental controls;Functional tasks;Compensatory techniques;Multimodal communcation approach;Cueing hierarchy;Language facilitation;SLP instruction and feedback   memory strategies   Potential to Achieve Goals Good    Consulted and Agree with Plan of Care Patient             Patient will benefit from skilled therapeutic intervention in order to improve the following deficits and impairments:   Cognitive communication deficit  Aphasia    Problem List Patient Active Problem List   Diagnosis Date Noted   Community acquired pneumonia 09/16/2020   Upper airway cough syndrome 03/26/2019   Chronic pain 01/10/2019   MCI (mild cognitive impairment) 03/20/2017   Gait abnormality 03/20/2017   History of right below knee amputation ([place]) 07/11/2016   Adenomatous colon polyp 05/10/2016   Coronary artery calcification 01/07/2016   Essential hypertension 01/07/2016   Back pain 09/09/2015   Abnormal CXR 05/16/2014   Cough variant asthma ? assoc with UACS 05/15/2014   Acute post-operative pain 02/18/2012   Adiposity 02/17/2012   Does use eyeglasses 02/13/2012   H/O arthrodesis 10/21/2011   Fibromyalgia 04/11/2011   HLD (hyperlipidemia) 04/11/2011   Avascular necrosis of talus ([facility]) 04/11/2011   Osteomyelitis of ankle ([place]) 04/11/2011   Leg edema 09/08/2010    Garald Balding, [place] 05/14/2021, 5:05 PM  [facility] [address] [place], [place], [address] Phone: [phone]   Fax:  [phone]   Name: Taleshia Luff MRN: [id number] Date of  Birth: 1951/05/11

## 2021-05-19 DIAGNOSIS — L570 Actinic keratosis: Secondary | ICD-10-CM | POA: Diagnosis not present

## 2021-05-19 DIAGNOSIS — D2272 Melanocytic nevi of left lower limb, including hip: Secondary | ICD-10-CM | POA: Diagnosis not present

## 2021-05-19 DIAGNOSIS — D2261 Melanocytic nevi of right upper limb, including shoulder: Secondary | ICD-10-CM | POA: Diagnosis not present

## 2021-05-19 DIAGNOSIS — M79642 Pain in left hand: Secondary | ICD-10-CM | POA: Diagnosis not present

## 2021-05-19 DIAGNOSIS — L718 Other rosacea: Secondary | ICD-10-CM | POA: Diagnosis not present

## 2021-05-19 DIAGNOSIS — D2262 Melanocytic nevi of left upper limb, including shoulder: Secondary | ICD-10-CM | POA: Diagnosis not present

## 2021-05-19 DIAGNOSIS — D225 Melanocytic nevi of trunk: Secondary | ICD-10-CM | POA: Diagnosis not present

## 2021-05-19 DIAGNOSIS — Z85828 Personal history of other malignant neoplasm of skin: Secondary | ICD-10-CM | POA: Diagnosis not present

## 2021-05-19 DIAGNOSIS — M79641 Pain in right hand: Secondary | ICD-10-CM | POA: Diagnosis not present

## 2021-05-19 DIAGNOSIS — D2271 Melanocytic nevi of right lower limb, including hip: Secondary | ICD-10-CM | POA: Diagnosis not present

## 2021-05-19 DIAGNOSIS — L814 Other melanin hyperpigmentation: Secondary | ICD-10-CM | POA: Diagnosis not present

## 2021-05-20 DIAGNOSIS — M545 Low back pain, unspecified: Secondary | ICD-10-CM | POA: Diagnosis not present

## 2021-05-20 DIAGNOSIS — R2689 Other abnormalities of gait and mobility: Secondary | ICD-10-CM | POA: Diagnosis not present

## 2021-05-21 ENCOUNTER — Ambulatory Visit: Payer: Medicare Other

## 2021-05-24 DIAGNOSIS — R2689 Other abnormalities of gait and mobility: Secondary | ICD-10-CM | POA: Diagnosis not present

## 2021-05-24 DIAGNOSIS — M545 Low back pain, unspecified: Secondary | ICD-10-CM | POA: Diagnosis not present

## 2021-05-27 DIAGNOSIS — M545 Low back pain, unspecified: Secondary | ICD-10-CM | POA: Diagnosis not present

## 2021-05-27 DIAGNOSIS — R2689 Other abnormalities of gait and mobility: Secondary | ICD-10-CM | POA: Diagnosis not present

## 2021-06-01 ENCOUNTER — Other Ambulatory Visit: Payer: Self-pay | Admitting: Internal Medicine

## 2021-06-04 ENCOUNTER — Other Ambulatory Visit: Payer: Self-pay

## 2021-06-04 ENCOUNTER — Ambulatory Visit: Payer: Medicare Other

## 2021-06-04 DIAGNOSIS — R41841 Cognitive communication deficit: Secondary | ICD-10-CM | POA: Diagnosis not present

## 2021-06-04 DIAGNOSIS — Z23 Encounter for immunization: Secondary | ICD-10-CM | POA: Diagnosis not present

## 2021-06-04 DIAGNOSIS — M545 Low back pain, unspecified: Secondary | ICD-10-CM | POA: Diagnosis not present

## 2021-06-04 DIAGNOSIS — R2689 Other abnormalities of gait and mobility: Secondary | ICD-10-CM | POA: Diagnosis not present

## 2021-06-04 DIAGNOSIS — R4701 Aphasia: Secondary | ICD-10-CM

## 2021-06-04 NOTE — Therapy (Signed)
[facility] [address] [place], [place], [address] Phone: [phone]   Fax:  [phone]  Speech Language Pathology Treatment  Patient Details  Name: Shelly [name] MRN: [id number] Date of Birth: [birth year] Referring Provider (SLP): Neldon Newport (ref), Dohmeier, Asencion Partridge MD (documentation)   Encounter Date: 06/04/2021   End of Session - 06/04/21 1052     Visit Number 4    Number of Visits 9    Date for SLP Re-Evaluation 06/11/21    SLP Start Time 43    SLP Stop Time  4970    SLP Time Calculation (min) 33 min    Activity Tolerance Patient tolerated treatment well             Past Medical History:  Diagnosis Date   Anxiety    Arthritis    Asthma    Avascular necrosis of talus ([place])    Cancer ([place])    vulva pre cancer had surgery   Depression    Gait abnormality 03/20/2017   GERD (gastroesophageal reflux disease)    Hearing loss    "very minor"   History of pneumonia    Hyperlipidemia    Hypertension    Leg edema    left   Memory difficulty 03/20/2017   PONV (postoperative nausea and vomiting)    Stress incontinence     Past Surgical History:  Procedure Laterality Date   ANKLE FUSION  2011   right multiple    ANKLE FUSION     rear ankle fusion   ANKLE SURGERY  2010   right cordicompression   AORTIC ARCH ANGIOGRAPHY N/A 12/11/2017   Procedure: AORTIC ARCH ANGIOGRAPHY;  Surgeon: Elam Dutch, MD;  Location: [facility];  Service: Cardiovascular;  Laterality: N/A;   APPENDECTOMY  05/10/2016   BELOW KNEE LEG AMPUTATION     CATARACT EXTRACTION W/ INTRAOCULAR LENS IMPLANT Left    LAPAROSCOPIC APPENDECTOMY N/A 05/10/2016   Procedure: APPENDECTOMY LAPAROSCOPIC;  Surgeon: Coralie Keens, MD;  Location: [facility];  Service: General;  Laterality: N/A;   LUMBAR LAMINECTOMY/DECOMPRESSION MICRODISCECTOMY N/A 09/09/2015   Procedure:  L4-S1 Decompression/ Discetomy;  Surgeon: Melina Schools, MD;   Location: [facility];  Service: Orthopedics;  Laterality: N/A;   SKIN GRAFT     SPINAL CORD STIMULATOR INSERTION N/A 01/10/2019   Procedure: LUMBAR SPINAL CORD STIMULATOR INSERTION;  Surgeon: Melina Schools, MD;  Location: [facility];  Service: Orthopedics;  Laterality: N/A;  2.5 hrs   [place] N/A 01/28/2015   Procedure: WIDE EXCISION VULVECTOMY;  Surgeon: Thurnell Lose, MD;  Location: [facility];  Service: Gynecology;  Laterality: N/A;    There were no vitals filed for this visit.   Subjective Assessment - 06/04/21 1024     Subjective "I'm encouraged.Marland KitchenMarland KitchenMarland KitchenI never thought I'd have made as much progress as I have made."    Pain Score --   sitting - no pain, walking -pain                  ADULT SLP TREATMENT - 06/04/21 1025       General Information   Behavior/Cognition Alert;Cooperative;Pleasant mood      Treatment Provided   Treatment provided Cognitive-Linquistic      Cognitive-Linquistic Treatment   Treatment focused on Cognition    Skilled Treatment Pt reports she still has problems with people's names, but is better with staying calm, which facilitates her being able to  think of the right word, instead of getting upset. She uses anomia strategies and memory strategies successfully in the last three weeks. She feels like her Fluoxetine readministration has been immensely helpful with her anomia and memory issues. Ria likely needs one-two more ST sessions to ensure cont'd progress. SLP and pt spoke for 25 minutes in mod complex-complex conversational context without anomic episodes. SLP reviewed the semantic feature analysis with pt and she said she had done this a few times since she rec'd the handout. Pt agreed next week to assess whether or not 06-25-21 is necessary      Assessment / Recommendations / Plan   Plan Continue with current plan of care      Progression Toward Goals   Progression toward goals Progressing toward goals                 SLP Short Term Goals - 05/14/21 1036       SLP SHORT TERM GOAL #1   Title pt will describe to SLP 3 compensations for word finding in 2 sessions    Baseline 05-14-21    Time 4    Period Weeks    Status Partially Met    Target Date 05/14/21   added 1 week due to holiday     [place] #2   Title pt will complete semantic feature analysis and Verb Network strengthening treatment (VNeST) in session independently    Time 4    Period Weeks    Status Achieved    Target Date 05/14/21      SLP SHORT TERM GOAL #3   Title pt will use 2 memory compensations successfully between 3 sessions    Baseline 05-07-21; 05-14-21    Time 4    Period Weeks    Status Partially Met    Target Date 05/14/21      SLP SHORT TERM GOAL #4   Title pt will use anoima compensations successfully PRN in 10 minutes simple/mod complex conversation    Time 4    Period Weeks    Status Achieved    Target Date 05/14/21              SLP Long Term Goals - 06/04/21 1032       SLP LONG TERM GOAL #1   Title pt will use anomia compensations successfully PRN in 15 minutes simple/mod complex conversation x2 sessions    Baseline 05-14-21, 06-04-21    Time 8    Period Weeks    Status On-going    Target Date 06/11/21      SLP LONG TERM GOAL #2   Title pt will report higher effectiveness of communication via Communication Effectiveness Survey in last 1-2 sessions compared to the first therapy session    Time 8    Period Weeks    Status On-going    Target Date 06/11/21      SLP LONG TERM GOAL #3   Title pt will use two memory compensations successfully/report successful use of 2 strategies between 5 sessions    Baseline 05-14-21, 06-04-21    Time 8    Period Weeks    Status On-going    Target Date 06/11/21              Plan - 06/04/21 1053     Clinical Impression Statement Ria cont to state memory compensations discussed in previous sessions have been helpful to her since last  session. Pt will benefit from 1-2 more  sessions of skilled ST to learn more effective memory compensations as well as some techniques and tasks to improve her word finding.    Speech Therapy Frequency 1x /week    Duration 8 weeks    Treatment/Interventions Environmental controls;Functional tasks;Compensatory techniques;Multimodal communcation approach;Cueing hierarchy;Language facilitation;SLP instruction and feedback   memory strategies   Potential to Achieve Goals Good    Consulted and Agree with Plan of Care Patient             Patient will benefit from skilled therapeutic intervention in order to improve the following deficits and impairments:   Aphasia  Cognitive communication deficit    Problem List Patient Active Problem List   Diagnosis Date Noted   Community acquired pneumonia 09/16/2020   Upper airway cough syndrome 03/26/2019   Chronic pain 01/10/2019   MCI (mild cognitive impairment) 03/20/2017   Gait abnormality 03/20/2017   History of right below knee amputation ([place]) 07/11/2016   Adenomatous colon polyp 05/10/2016   Coronary artery calcification 01/07/2016   Essential hypertension 01/07/2016   Back pain 09/09/2015   Abnormal CXR 05/16/2014   Cough variant asthma ? assoc with UACS 05/15/2014   Acute post-operative pain 02/18/2012   Adiposity 02/17/2012   Does use eyeglasses 02/13/2012   H/O arthrodesis 10/21/2011   Fibromyalgia 04/11/2011   HLD (hyperlipidemia) 04/11/2011   Avascular necrosis of talus ([place]) 04/11/2011   Osteomyelitis of ankle ([place]) 04/11/2011   Leg edema 09/08/2010    [facility], [place] 06/04/2021, 10:58 AM  [facility] [address] [place], [place], [address] Phone: [phone]   Fax:  [phone]   Name: Shelly [name] MRN: [id number] Date of Birth: 04-20-1951

## 2021-06-09 DIAGNOSIS — R2689 Other abnormalities of gait and mobility: Secondary | ICD-10-CM | POA: Diagnosis not present

## 2021-06-09 DIAGNOSIS — M545 Low back pain, unspecified: Secondary | ICD-10-CM | POA: Diagnosis not present

## 2021-06-11 ENCOUNTER — Other Ambulatory Visit: Payer: Self-pay

## 2021-06-11 ENCOUNTER — Ambulatory Visit: Payer: Medicare Other | Attending: Family Medicine

## 2021-06-11 DIAGNOSIS — R4701 Aphasia: Secondary | ICD-10-CM | POA: Insufficient documentation

## 2021-06-11 DIAGNOSIS — R41841 Cognitive communication deficit: Secondary | ICD-10-CM | POA: Diagnosis not present

## 2021-06-11 NOTE — Therapy (Signed)
[facility] [address] [place], [place], [address] Phone: [phone]   Fax:  [phone]  Speech Language Pathology Treatment/Discharge Summary  Patient Details  Name: Shelly [name] MRN: [id number] Date of Birth: [birth year] Referring Provider (SLP): Neldon Newport (ref), Dohmeier, Asencion Partridge MD (documentation)   Encounter Date: 06/11/2021   End of Session - 06/11/21 1102     Visit Number 5    Number of Visits 9    Date for SLP Re-Evaluation 06/11/21    SLP Start Time 1018    SLP Stop Time  1058    SLP Time Calculation (min) 40 min    Activity Tolerance Patient tolerated treatment well             Past Medical History:  Diagnosis Date   Anxiety    Arthritis    Asthma    Avascular necrosis of talus ([place])    Cancer ([place])    vulva pre cancer had surgery   Depression    Gait abnormality 03/20/2017   GERD (gastroesophageal reflux disease)    Hearing loss    "very minor"   History of pneumonia    Hyperlipidemia    Hypertension    Leg edema    left   Memory difficulty 03/20/2017   PONV (postoperative nausea and vomiting)    Stress incontinence     Past Surgical History:  Procedure Laterality Date   ANKLE FUSION  2011   right multiple    ANKLE FUSION     rear ankle fusion   ANKLE SURGERY  2010   right cordicompression   AORTIC ARCH ANGIOGRAPHY N/A 12/11/2017   Procedure: AORTIC ARCH ANGIOGRAPHY;  Surgeon: Elam Dutch, MD;  Location: [facility];  Service: Cardiovascular;  Laterality: N/A;   APPENDECTOMY  05/10/2016   BELOW KNEE LEG AMPUTATION     CATARACT EXTRACTION W/ INTRAOCULAR LENS IMPLANT Left    LAPAROSCOPIC APPENDECTOMY N/A 05/10/2016   Procedure: APPENDECTOMY LAPAROSCOPIC;  Surgeon: Coralie Keens, MD;  Location: [facility];  Service: General;  Laterality: N/A;   LUMBAR LAMINECTOMY/DECOMPRESSION MICRODISCECTOMY N/A 09/09/2015   Procedure:  L4-S1 Decompression/ Discetomy;  Surgeon:  Melina Schools, MD;  Location: [facility];  Service: Orthopedics;  Laterality: N/A;   SKIN GRAFT     SPINAL CORD STIMULATOR INSERTION N/A 01/10/2019   Procedure: LUMBAR SPINAL CORD STIMULATOR INSERTION;  Surgeon: Melina Schools, MD;  Location: [facility];  Service: Orthopedics;  Laterality: N/A;  2.5 hrs   [place] N/A 01/28/2015   Procedure: WIDE EXCISION VULVECTOMY;  Surgeon: Thurnell Lose, MD;  Location: [facility];  Service: Gynecology;  Laterality: N/A;    There were no vitals filed for this visit.  SPEECH THERAPY DISCHARGE SUMMARY  Visits from Start of Care: 5  Current functional level related to goals / functional outcomes: See goals below.   Remaining deficits: Mild memory deficits likely age-appropriate.   Education / Equipment: Memory compensations, techniques to foster word finding skills   Patient agrees to discharge. Patient goals were partially met. Patient is being discharged due to meeting the stated rehab goals.Marland Kitchen and pt satisfied with progress.      Subjective Assessment - 06/11/21 1022     Subjective "The way I feel now compared to what I was feeling like is black and white."    Currently in Pain? Yes    Pain Score 2     Pain Location Back  Pain Orientation Lower    Pain Type Chronic pain    Pain Onset More than a month ago                   ADULT SLP TREATMENT - 06/11/21 1024       General Information   Behavior/Cognition Alert;Pleasant mood;Cooperative      Treatment Provided   Treatment provided Cognitive-Linquistic      Cognitive-Linquistic Treatment   Treatment focused on Cognition;Aphasia    Skilled Treatment Pt reports she feels immensely more confident with her memory given her memory compensations and her lessened anxiety over those rare occurrences. She and SLP both agree medication appeared to make a negative impact on these skills. SLP and pt talked for 15-20 minutes today about mod complex/complex  topics and pt without word finding errors/occurrences. She rated her communcation effectiveness as 31/32 (32=best score).      Assessment / Recommendations / Plan   Plan Discharge SLP treatment due to (comment)   pt met goals/satisfied with current level     Progression Toward Goals   Progression toward goals --   d/c day - see goals               SLP Short Term Goals - 05/14/21 1036       SLP SHORT TERM GOAL #1   Title pt will describe to SLP 3 compensations for word finding in 2 sessions    Baseline 05-14-21    Time 4    Period Weeks    Status Partially Met    Target Date 05/14/21   added 1 week due to holiday     [place] #2   Title pt will complete semantic feature analysis and Verb Network strengthening treatment (VNeST) in session independently    Time 4    Period Weeks    Status Achieved    Target Date 05/14/21      SLP SHORT TERM GOAL #3   Title pt will use 2 memory compensations successfully between 3 sessions    Baseline 05-07-21; 05-14-21    Time 4    Period Weeks    Status Partially Met    Target Date 05/14/21      SLP SHORT TERM GOAL #4   Title pt will use anoima compensations successfully PRN in 10 minutes simple/mod complex conversation    Time 4    Period Weeks    Status Achieved    Target Date 05/14/21              SLP Long Term Goals - 06/11/21 1031       SLP LONG TERM GOAL #1   Title pt will use anomia compensations successfully PRN in 15 minutes simple/mod complex conversation x2 sessions    Baseline 05-14-21, 06-04-21    Status Achieved      SLP LONG TERM GOAL #2   Title pt will report higher effectiveness of communication via Communication Effectiveness Survey in last 1-2 sessions compared to the first therapy session    Time 8    Period Weeks    Status Deferred   SLP did not take initial measure, however pt told SLP her difference in function is "like black and white"   Target Date 06/11/21      SLP LONG TERM GOAL #3    Title pt will use two memory compensations successfully/report successful use of 2 strategies between 5 sessions    Baseline 05-14-21, 06-04-21, 06-11-21  Time 8    Period Weeks    Status Partially Met    Target Date 06/11/21              Plan - 06/11/21 1102     Clinical [place] cont to state memory compensations discussed in previous sessions have been helpful to her since last session. Pt and SLP ensured benefits from [place] continued from last session and pt is satisfied with progress. "You have incr'd my confidence level 100%." she stated at end of session.    Treatment/Interventions Environmental controls;Functional tasks;Compensatory techniques;Multimodal communcation approach;Cueing hierarchy;Language facilitation;SLP instruction and feedback   memory strategies   Potential to Achieve Goals Good    Consulted and Agree with Plan of Care Patient             Patient will benefit from skilled therapeutic intervention in order to improve the following deficits and impairments:   Cognitive communication deficit  Aphasia    Problem List Patient Active Problem List   Diagnosis Date Noted   Community acquired pneumonia 09/16/2020   Upper airway cough syndrome 03/26/2019   Chronic pain 01/10/2019   MCI (mild cognitive impairment) 03/20/2017   Gait abnormality 03/20/2017   History of right below knee amputation ([place]) 07/11/2016   Adenomatous colon polyp 05/10/2016   Coronary artery calcification 01/07/2016   Essential hypertension 01/07/2016   Back pain 09/09/2015   Abnormal CXR 05/16/2014   Cough variant asthma ? 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 [place], [place], [address] Phone: [phone]   Fax:  [phone]   Name: Shelly [name] MRN: [id number] Date of Birth: January 13, 1951

## 2021-06-14 DIAGNOSIS — M545 Low back pain, unspecified: Secondary | ICD-10-CM | POA: Diagnosis not present

## 2021-06-14 DIAGNOSIS — R2689 Other abnormalities of gait and mobility: Secondary | ICD-10-CM | POA: Diagnosis not present

## 2021-06-21 DIAGNOSIS — M545 Low back pain, unspecified: Secondary | ICD-10-CM | POA: Diagnosis not present

## 2021-06-21 DIAGNOSIS — R2689 Other abnormalities of gait and mobility: Secondary | ICD-10-CM | POA: Diagnosis not present

## 2021-06-23 ENCOUNTER — Ambulatory Visit: Payer: Medicare Other

## 2021-06-23 ENCOUNTER — Other Ambulatory Visit: Payer: Self-pay

## 2021-06-23 DIAGNOSIS — M722 Plantar fascial fibromatosis: Secondary | ICD-10-CM

## 2021-06-23 NOTE — Progress Notes (Signed)
SITUATION Reason for Consult: Patient wanted second opinion on existing orthotics Patient / Caregiver Report: Patient is amazed now that the insert has been fit to her shoe  OBJECTIVE DATA History / Diagnosis:    ICD-10-CM   1. Plantar fibromatosis  M72.2       ACTIONS PERFORMED Patient's equipment was checked for structural stability and fit. Orthosis was trimmed to fit patient's sneaker. Device(s) intact and fit is excellent. All questions answered and concerns addressed.  PLAN Follow-up as needed (PRN). Plan of care discussed with and agreed upon by patient / caregiver.

## 2021-06-25 ENCOUNTER — Ambulatory Visit: Payer: Medicare Other

## 2021-06-25 DIAGNOSIS — M545 Low back pain, unspecified: Secondary | ICD-10-CM | POA: Diagnosis not present

## 2021-06-25 DIAGNOSIS — R2689 Other abnormalities of gait and mobility: Secondary | ICD-10-CM | POA: Diagnosis not present

## 2021-06-28 DIAGNOSIS — R2689 Other abnormalities of gait and mobility: Secondary | ICD-10-CM | POA: Diagnosis not present

## 2021-06-28 DIAGNOSIS — M545 Low back pain, unspecified: Secondary | ICD-10-CM | POA: Diagnosis not present

## 2021-07-09 DIAGNOSIS — M545 Low back pain, unspecified: Secondary | ICD-10-CM | POA: Diagnosis not present

## 2021-07-09 DIAGNOSIS — R2689 Other abnormalities of gait and mobility: Secondary | ICD-10-CM | POA: Diagnosis not present

## 2021-07-14 DIAGNOSIS — M545 Low back pain, unspecified: Secondary | ICD-10-CM | POA: Diagnosis not present

## 2021-07-14 DIAGNOSIS — R2689 Other abnormalities of gait and mobility: Secondary | ICD-10-CM | POA: Diagnosis not present

## 2021-07-20 ENCOUNTER — Other Ambulatory Visit: Payer: Self-pay | Admitting: Internal Medicine

## 2021-07-21 DIAGNOSIS — M810 Age-related osteoporosis without current pathological fracture: Secondary | ICD-10-CM | POA: Diagnosis not present

## 2021-07-27 DIAGNOSIS — F325 Major depressive disorder, single episode, in full remission: Secondary | ICD-10-CM | POA: Diagnosis not present

## 2021-07-27 DIAGNOSIS — E785 Hyperlipidemia, unspecified: Secondary | ICD-10-CM | POA: Diagnosis not present

## 2021-07-27 DIAGNOSIS — I10 Essential (primary) hypertension: Secondary | ICD-10-CM | POA: Diagnosis not present

## 2021-08-05 ENCOUNTER — Telehealth: Payer: Self-pay | Admitting: Orthopedic Surgery

## 2021-08-05 DIAGNOSIS — R2242 Localized swelling, mass and lump, left lower limb: Secondary | ICD-10-CM | POA: Diagnosis not present

## 2021-08-05 NOTE — Telephone Encounter (Signed)
Pt called and need supplies form [facility]. [employer] for liners, sleeves ect. Please call pt about this matter at [place]. ?

## 2021-08-08 NOTE — Progress Notes (Signed)
Cardiology Office Note   Date:  08/09/2021   ID:  Shelly [name], Shelly [name] 1950/12/22, MRN [id number]  PCP:  Carol Ada, MD    No chief complaint on file.  Coronary artery calfication  Wt Readings from Last 3 Encounters:  08/09/21 188 lb (85.3 kg)  03/22/21 183 lb 9.6 oz (83.3 kg)  03/02/21 178 lb 6.4 oz (80.9 kg)       History of Present Illness: Shelly [name] is a 71 y.o. female    Who had coronary calcium noted in 2015.  She was asymptomatic and then had a stress test that was negative.  She has RF for CAD including high cholesterol.  Her brother had CABG in he past few years.   SHe had back surgery in 2017 and there were no cardiac issues at that time.     She saw vascular surgery as follows: "Shelly [name] is a 71 y.o. female referred for evaluation after recent TIA event with right subclavian stenosis.  On June 1 of this year the patient had an event where she had severe confusion and difficulty driving and understanding her actions.  She was sent to the emergency room for evaluation and had a CT Angio of the neck which showed a right subclavian artery stenosis an MRI scan which showed no evidence of stroke but possible symptoms of TIA.  She was also seen by her neurologist.  She currently is on aspirin and Crestor.  She denies any prior TIA events.  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  She did have an MRI in October 2018 which showed no infarct.  "   Angio by Dr. Oneida Alar in 7/19 showed: " The patient apparently had a recent TIA event.  CT scan suggested a high-grade right subclavian artery stenosis.  She has symmetric blood pressures.  Angiogram shows 40% of the most narrowing of the right subclavian artery.  I do not believe that this is the etiology of her TIA.  The rest of her great vessels are widely patent.  I do not believe from a vascular standpoint we can explain her current TIA symptoms.  I will defer this to  her neurologist Dr. [name] to see if any other further work-up or evaluation is necessary.  From my standpoint it is okay for the patient to drive starting tomorrow.."   She has been checked for dementia.  She had some vascular changes in her brain   In Jan 2020, she was considering Keto diet.  I explained that from a heart standpoint, plant based and mediterranean diet have better data looking forward at reducing coronary events.  She has had cataract surgery, spinal cord stimulator in 2020.   Exercise in the past: "She walks with her dog, 20-25 miles/week.  Limited by leg pain, but not from a cardiac standpoint."   She has had a persistent cough for several years.  Related to GERD.    Got a new prosthetic leg in 2021.  Has articulating ankle and falls had stopped.  Having some falls of late.  Went to [place] to take care of grandkids. [place] there due to prosthesis.  Negative CT head and xrays of hip.   Denies : Chest pain. Dizziness. Leg edema. Nitroglycerin use. Orthopnea. Palpitations. Paroxysmal nocturnal dyspnea. Shortness of breath. Syncope.        Past Medical History:  Diagnosis Date   Anxiety    Arthritis  Asthma    Avascular necrosis of talus ([place])    Cancer ([place])    vulva pre cancer had surgery   Depression    Gait abnormality 03/20/2017   GERD (gastroesophageal reflux disease)    Hearing loss    "very minor"   History of pneumonia    Hyperlipidemia    Hypertension    Leg edema    left   Memory difficulty 03/20/2017   PONV (postoperative nausea and vomiting)    Stress incontinence     Past Surgical History:  Procedure Laterality Date   ANKLE FUSION  2011   right multiple    ANKLE FUSION     rear ankle fusion   ANKLE SURGERY  2010   right cordicompression   AORTIC ARCH ANGIOGRAPHY N/A 12/11/2017   Procedure: AORTIC ARCH ANGIOGRAPHY;  Surgeon: Elam Dutch, MD;  Location: [facility];  Service: Cardiovascular;  Laterality: N/A;   APPENDECTOMY   05/10/2016   BELOW KNEE LEG AMPUTATION     CATARACT EXTRACTION W/ INTRAOCULAR LENS IMPLANT Left    LAPAROSCOPIC APPENDECTOMY N/A 05/10/2016   Procedure: APPENDECTOMY LAPAROSCOPIC;  Surgeon: Coralie Keens, MD;  Location: [facility];  Service: General;  Laterality: N/A;   LUMBAR LAMINECTOMY/DECOMPRESSION MICRODISCECTOMY N/A 09/09/2015   Procedure:  L4-S1 Decompression/ Discetomy;  Surgeon: Melina Schools, MD;  Location: [facility];  Service: Orthopedics;  Laterality: N/A;   SKIN GRAFT     SPINAL CORD STIMULATOR INSERTION N/A 01/10/2019   Procedure: LUMBAR SPINAL CORD STIMULATOR INSERTION;  Surgeon: Melina Schools, MD;  Location: [facility];  Service: Orthopedics;  Laterality: N/A;  2.5 hrs   Cut Off N/A 01/28/2015   Procedure: WIDE EXCISION VULVECTOMY;  Surgeon: Thurnell Lose, MD;  Location: [facility];  Service: Gynecology;  Laterality: N/A;     Current Outpatient Medications  Medication Sig Dispense Refill   acidophilus (RISAQUAD) CAPS capsule Take 1 capsule by mouth daily.     aspirin 81 MG EC tablet TAKE 1 TABLET(81 MG) BY MOUTH DAILY 90 tablet 3   aspirin 81 MG EC tablet Take by mouth.     benzonatate (TESSALON) 200 MG capsule TAKE 1 CAPSULE(200 MG) BY MOUTH THREE TIMES DAILY AS NEEDED FOR COUGH 90 capsule 2   Biotin 5000 MCG TABS Take 10,000 mcg by mouth every evening.     buPROPion (WELLBUTRIN XL) 300 MG 24 hr tablet Take 300 mg by mouth daily.     Calcium Carbonate (CALCIUM 600 PO) Take by mouth daily.     chlorpheniramine (CHLOR-TRIMETON) 4 MG tablet Take 8 mg by mouth 2 (two) times a day.      Cholecalciferol (VITAMIN D-3) 125 MCG (5000 UT) TABS Take by mouth daily.     Cyanocobalamin (B-12 PO) Take 5,000 mcg by mouth daily.      denosumab (PROLIA) 60 MG/ML SOLN injection Inject 60 mg into the skin every 6 (six) months.      FLUoxetine (PROZAC) 20 MG capsule Take 20 mg by mouth daily.     losartan (COZAAR) 50 MG tablet Take 50 mg by mouth daily.       MAGNESIUM GLYCINATE PO Take by mouth.     methocarbamol (ROBAXIN) 500 MG tablet Take 500 mg by mouth every 8 (eight) hours as needed.     montelukast (SINGULAIR) 10 MG tablet TAKE 1 TABLET BY MOUTH EVERY DAY 90 tablet 2   Multiple Vitamins-Minerals (CENTRUM SILVER PO) Take 1 tablet  by mouth daily.     pantoprazole (PROTONIX) 40 MG tablet TAKE 1 TABLET(40 MG) BY MOUTH DAILY 30 TO 60 MINUTES BEFORE FIRST MEAL OF THE DAY 90 tablet 2   Potassium 99 MG TABS Take by mouth.     rosuvastatin (CRESTOR) 10 MG tablet Take 10 mg by mouth every evening.   1   SYMBICORT 80-4.5 MCG/ACT inhaler INHALE 2 PUFFS INTO THE LUNGS TWICE DAILY 10.2 g 11   traMADol (ULTRAM) 50 MG tablet Take 50 mg by mouth 3 (three) times daily as needed.     Turmeric (QC TUMERIC COMPLEX PO) Take 1,400 mg by mouth daily.     valACYclovir (VALTREX) 1000 MG tablet Take 1,000 mg by mouth daily as needed.      Acetylcarnitine HCl (ACETYL L-CARNITINE) 500 MG CAPS Take by mouth daily. (Patient not taking: Reported on 03/22/2021)     B Complex-Folic Acid (B COMPLEX PLUS PO) 1 tablet     No current facility-administered medications for this visit.    Allergies:   Patient has no known allergies.    Social History:  The patient  reports that she quit smoking about 14 years ago. Her smoking use included cigarettes. She has a 40.00 pack-year smoking history. She has never used smokeless tobacco. She reports current alcohol use. She reports that she does not use drugs.   Family History:  The patient's family history includes Allergies in her mother; Breast cancer in her sister; Dementia (age of onset: 24) in her mother; Fibromyalgia in her mother; Heart attack (age of onset: 28) in her father; Heart disease in her brother; Heart murmur (age of onset: 12) in her sister; Multiple myeloma in her father; Other in her brother; Other (age of onset: 26) in her sister.    ROS:  Please see the history of present illness.   Otherwise, review of systems  are positive for falls.   All other systems are reviewed and negative.    PHYSICAL EXAM: VS:  BP 98/62    Pulse 89    Wt 188 lb (85.3 kg)    LMP  (LMP Unknown)    SpO2 94%    BMI 30.34 kg/m  , BMI Body mass index is 30.34 kg/m. GEN: Well nourished, well developed, in no acute distress HEENT: normal Neck: no JVD, carotid bruits, or masses Cardiac: RRR; no murmurs, rubs, or gallops,no edema  Respiratory:  clear to auscultation bilaterally, normal work of breathing GI: soft, nontender, nondistended, + BS MS: no deformity or atrophy; palpable radial pulses bilaterally, prosthetic right leg Skin: warm and dry, no rash Neuro:  Strength and sensation are intact Psych: euthymic mood, full affect   EKG:   The ekg ordered today demonstrates NSR, prolonged PR interval   Recent Labs: No results found for requested labs within last 8760 hours.   Lipid Panel No results found for: CHOL, TRIG, HDL, CHOLHDL, VLDL, LDLCALC, LDLDIRECT   Other studies Reviewed: Additional studies/ records that were reviewed today with results demonstrating: LDL 61 in 02/2021.   ASSESSMENT AND PLAN:  Coronary calcification:  No angina on medical therapy.  Any aggressive secondary prevention.  I encouraged her to try to be more active.  A stationary bike may be useful for her. Hyperlipidemia: Whole food, plant-based diet.  Avoid processed foods.  High-fiber diet. Continue rosuvastatin. Recommended recumbent bike for her.  Back pain limits walking. HTN: The current medical regimen is effective;  continue present plan and medications. PAD: Subclavian artery steal.  No dizziness or passing out.  Does have balance issues with change in position.  No sx with using left arm.  Followed with [facility].  LE edema: elevate legs.    Current medicines are reviewed at length with the patient today.  The patient concerns regarding her medicines were addressed.  The following changes have been made:  No change  Labs/ tests ordered  today include:  No orders of the defined types were placed in this encounter.   Recommend 150 minutes/week of aerobic exercise Low fat, low carb, high fiber diet recommended  Disposition:   FU in 1 year   Signed, Larae Grooms, MD  08/09/2021 10:59 AM    [facility] [place], [place], [place]  [address] Phone: [phone]; Fax: [phone]

## 2021-08-09 ENCOUNTER — Encounter: Payer: Self-pay | Admitting: Interventional Cardiology

## 2021-08-09 ENCOUNTER — Ambulatory Visit (INDEPENDENT_AMBULATORY_CARE_PROVIDER_SITE_OTHER): Payer: Medicare Other | Admitting: Interventional Cardiology

## 2021-08-09 ENCOUNTER — Other Ambulatory Visit: Payer: Self-pay

## 2021-08-09 VITALS — BP 98/62 | HR 89 | Ht 66.0 in | Wt 188.0 lb

## 2021-08-09 DIAGNOSIS — I2584 Coronary atherosclerosis due to calcified coronary lesion: Secondary | ICD-10-CM

## 2021-08-09 DIAGNOSIS — I2510 Atherosclerotic heart disease of native coronary artery without angina pectoris: Secondary | ICD-10-CM

## 2021-08-09 DIAGNOSIS — E785 Hyperlipidemia, unspecified: Secondary | ICD-10-CM | POA: Diagnosis not present

## 2021-08-09 DIAGNOSIS — I771 Stricture of artery: Secondary | ICD-10-CM

## 2021-08-09 DIAGNOSIS — I10 Essential (primary) hypertension: Secondary | ICD-10-CM

## 2021-08-09 NOTE — Telephone Encounter (Signed)
Can you please write rx for [facility]? thanks ?

## 2021-08-09 NOTE — Patient Instructions (Addendum)
Medication Instructions:  Your physician recommends that you continue on your current medications as directed. Please refer to the Current Medication list given to you today.  *If you need a refill on your cardiac medications before your next appointment, please call your pharmacy*   Lab Work: none If you have labs (blood work) drawn today and your tests are completely normal, you will receive your results only by: MyChart Message (if you have MyChart) OR A paper copy in the mail If you have any lab test that is abnormal or we need to change your treatment, we will call you to review the results.   Testing/Procedures: none   Follow-Up: At [facility], you and your health needs are our priority.  As part of our continuing mission to provide you with exceptional heart care, we have created designated Provider Care Teams.  These Care Teams include your primary Cardiologist (physician) and Advanced Practice Providers (APPs -  Physician Assistants and Nurse Practitioners) who all work together to provide you with the care you need, when you need it.  We recommend signing up for the patient portal called "MyChart".  Sign up information is provided on this After Visit Summary.  MyChart is used to connect with patients for Virtual Visits (Telemedicine).  Patients are able to view lab/test results, encounter notes, upcoming appointments, etc.  Non-urgent messages can be sent to your provider as well.   To learn more about what you can do with MyChart, go to [email or web address].    Your next appointment:   12 month(s)  The format for your next appointment:   In Person  Provider:   Jayadeep Varanasi, MD     Other Instructions  High-Fiber Eating Plan Fiber, also called dietary fiber, is a type of carbohydrate. It is found foods such as fruits, vegetables, whole grains, and beans. A high-fiber diet can have many health benefits. Your health care provider may recommend a high-fiber diet  to help: Prevent constipation. Fiber can make your bowel movements more regular. Lower your cholesterol. Relieve the following conditions: Inflammation of veins in the anus (hemorrhoids). Inflammation of specific areas of the digestive tract (uncomplicated diverticulosis). A problem of the large intestine, also called the colon, that sometimes causes pain and diarrhea (irritable bowel syndrome, or IBS). Prevent overeating as part of a weight-loss plan. Prevent heart disease, type 2 diabetes, and certain cancers. What are tips for following this plan? Reading food labels  Check the nutrition facts label on food products for the amount of dietary fiber. Choose foods that have 5 grams of fiber or more per serving. The goals for recommended daily fiber intake include: Men (age 50 or younger): 34-38 g. Men (over age 50): 28-34 g. Women (age 50 or younger): 25-28 g. Women (over age 50): 22-25 g. Your daily fiber goal is _____________ g. Shopping Choose whole fruits and vegetables instead of processed forms, such as apple juice or applesauce. Choose a wide variety of high-fiber foods such as avocados, lentils, oats, and kidney beans. Read the nutrition facts label of the foods you choose. Be aware of foods with added fiber. These foods often have high sugar and sodium amounts per serving. Cooking Use whole-grain flour for baking and cooking. Cook with brown rice instead of white rice. Meal planning Start the day with a breakfast that is high in fiber, such as a cereal that contains 5 g of fiber or more per serving. Eat breads and cereals that are made with whole-grain flour instead of   refined flour or white flour. Eat brown rice, bulgur wheat, or millet instead of white rice. Use beans in place of meat in soups, salads, and pasta dishes. Be sure that half of the grains you eat each day are whole grains. General information You can get the recommended daily intake of dietary fiber  by: Eating a variety of fruits, vegetables, grains, nuts, and beans. Taking a fiber supplement if you are not able to take in enough fiber in your diet. It is better to get fiber through food than from a supplement. Gradually increase how much fiber you consume. If you increase your intake of dietary fiber too quickly, you may have bloating, cramping, or gas. Drink plenty of water to help you digest fiber. Choose high-fiber snacks, such as berries, raw vegetables, nuts, and popcorn. What foods should I eat? Fruits Berries. Pears. Apples. Oranges. Avocado. Prunes and raisins. Dried figs. Vegetables Sweet potatoes. Spinach. Kale. Artichokes. Cabbage. Broccoli. Cauliflower. Green peas. Carrots. Squash. Grains Whole-grain breads. Multigrain cereal. Oats and oatmeal. Brown rice. Barley. Bulgur wheat. Millet. Quinoa. Bran muffins. Popcorn. Rye wafer crackers. Meats and other proteins Navy beans, kidney beans, and pinto beans. Soybeans. Split peas. Lentils. Nuts and seeds. Dairy Fiber-fortified yogurt. Beverages Fiber-fortified soy milk. Fiber-fortified orange juice. Other foods Fiber bars. The items listed above may not be a complete list of recommended foods and beverages. Contact a dietitian for more information. What foods should I avoid? Fruits Fruit juice. Cooked, strained fruit. Vegetables Fried potatoes. Canned vegetables. Well-cooked vegetables. Grains White bread. Pasta made with refined flour. White rice. Meats and other proteins Fatty cuts of meat. Fried chicken or fried fish. Dairy Milk. Yogurt. Cream cheese. Sour cream. Fats and oils Butters. Beverages Soft drinks. Other foods Cakes and pastries. The items listed above may not be a complete list of foods and beverages to avoid. Talk with your dietitian about what choices are best for you. Summary Fiber is a type of carbohydrate. It is found in foods such as fruits, vegetables, whole grains, and beans. A high-fiber  diet has many benefits. It can help to prevent constipation, lower blood cholesterol, aid weight loss, and reduce your risk of heart disease, diabetes, and certain cancers. Increase your intake of fiber gradually. Increasing fiber too quickly may cause cramping, bloating, and gas. Drink plenty of water while you increase the amount of fiber you consume. The best sources of fiber include whole fruits and vegetables, whole grains, nuts, seeds, and beans. This information is not intended to replace advice given to you by your health care provider. Make sure you discuss any questions you have with your health care provider. Document Revised: 09/26/2019 Document Reviewed: 09/26/2019 Elsevier Patient Education  2022 Elsevier Inc.   

## 2021-08-11 ENCOUNTER — Other Ambulatory Visit (INDEPENDENT_AMBULATORY_CARE_PROVIDER_SITE_OTHER): Payer: Medicare Other

## 2021-08-11 DIAGNOSIS — I2510 Atherosclerotic heart disease of native coronary artery without angina pectoris: Secondary | ICD-10-CM

## 2021-08-15 DIAGNOSIS — Z20822 Contact with and (suspected) exposure to covid-19: Secondary | ICD-10-CM | POA: Diagnosis not present

## 2021-08-16 DIAGNOSIS — M5412 Radiculopathy, cervical region: Secondary | ICD-10-CM | POA: Diagnosis not present

## 2021-08-16 DIAGNOSIS — Z79899 Other long term (current) drug therapy: Secondary | ICD-10-CM | POA: Diagnosis not present

## 2021-08-16 DIAGNOSIS — M47816 Spondylosis without myelopathy or radiculopathy, lumbar region: Secondary | ICD-10-CM | POA: Diagnosis not present

## 2021-08-16 DIAGNOSIS — M545 Low back pain, unspecified: Secondary | ICD-10-CM | POA: Diagnosis not present

## 2021-08-16 DIAGNOSIS — Z5181 Encounter for therapeutic drug level monitoring: Secondary | ICD-10-CM | POA: Diagnosis not present

## 2021-08-24 ENCOUNTER — Telehealth: Payer: Self-pay | Admitting: Internal Medicine

## 2021-08-24 MED ORDER — PANTOPRAZOLE SODIUM 40 MG PO TBEC: DELAYED_RELEASE_TABLET | ORAL | 2 refills | Status: DC

## 2021-08-24 NOTE — Telephone Encounter (Signed)
Rx for protonix sent to preferred pharmacy. ? Patient is aware and voiced her understanding.  ?Nothing further needed.  ? ?

## 2021-08-27 DIAGNOSIS — Z20822 Contact with and (suspected) exposure to covid-19: Secondary | ICD-10-CM | POA: Diagnosis not present

## 2021-09-02 DIAGNOSIS — N1831 Chronic kidney disease, stage 3a: Secondary | ICD-10-CM | POA: Diagnosis not present

## 2021-09-02 DIAGNOSIS — E785 Hyperlipidemia, unspecified: Secondary | ICD-10-CM | POA: Diagnosis not present

## 2021-09-02 DIAGNOSIS — F325 Major depressive disorder, single episode, in full remission: Secondary | ICD-10-CM | POA: Diagnosis not present

## 2021-09-02 DIAGNOSIS — I10 Essential (primary) hypertension: Secondary | ICD-10-CM | POA: Diagnosis not present

## 2021-09-14 DIAGNOSIS — M5416 Radiculopathy, lumbar region: Secondary | ICD-10-CM | POA: Insufficient documentation

## 2021-09-15 DIAGNOSIS — Z20822 Contact with and (suspected) exposure to covid-19: Secondary | ICD-10-CM | POA: Diagnosis not present

## 2021-09-23 DIAGNOSIS — M5416 Radiculopathy, lumbar region: Secondary | ICD-10-CM | POA: Diagnosis not present

## 2021-09-24 DIAGNOSIS — Z23 Encounter for immunization: Secondary | ICD-10-CM | POA: Diagnosis not present

## 2021-09-27 ENCOUNTER — Other Ambulatory Visit: Payer: Self-pay | Admitting: Family Medicine

## 2021-09-27 ENCOUNTER — Ambulatory Visit
Admission: RE | Admit: 2021-09-27 | Discharge: 2021-09-27 | Disposition: A | Discharge status: Home / Self Care | Payer: Medicare Other | Source: Ambulatory Visit | Attending: Family Medicine | Admitting: Family Medicine

## 2021-09-27 DIAGNOSIS — M7989 Other specified soft tissue disorders: Secondary | ICD-10-CM

## 2021-09-27 DIAGNOSIS — M79605 Pain in left leg: Secondary | ICD-10-CM | POA: Diagnosis not present

## 2021-09-28 ENCOUNTER — Ambulatory Visit (INDEPENDENT_AMBULATORY_CARE_PROVIDER_SITE_OTHER): Payer: Medicare Other | Admitting: Neurology

## 2021-09-28 VITALS — BP 125/76 | HR 94 | Ht 66.0 in | Wt 188.0 lb

## 2021-09-28 DIAGNOSIS — G3184 Mild cognitive impairment, so stated: Secondary | ICD-10-CM

## 2021-09-28 NOTE — Patient Instructions (Signed)
Referral to neuro psych evaluation  ?See you back in 6-8 months, after neuro psych eval ? ?

## 2021-09-28 NOTE — Progress Notes (Signed)
? ?Patient: Shelly [name] ?Date of Birth: 1951/01/26 ? ?Reason for Visit: Follow up for memory ?History from: Patient, alone ?Primary Neurologist: Dr. Brett Fairy ? ?ASSESSMENT AND PLAN ?71 y.o. year old female  ? ?1.  Mild cognitive impairment ? ?-MOCA 23/30 today  ?-Referral for neuropsychological testing determine if true neurodegenerative process going on, has been stable for number of years, concern about family history ?-Follow-up in 6-8 months, will push appointment back if neuropsych testing is not yet complete ? ?HISTORY OF PRESENT ILLNESS: ?Today 09/28/21 ?Shelly [name] here today for follow-up. Shelly [name] 23/30. Was 25/30 at last visit. Thinks memory is declining. Her mother had dementia, she is paranoid this is happening to her. Lives alone, does everything independently. Worries her, goes to room can't remember what she was going to do. Drives a car well, but is getting concerned about night driving, judging distances. 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 Keeps lists on her phone for groceries. Pays her own bills. CT brain Feb 2022 showed age related changes, can't have any MRI due to spinal stimulator. Had cognitive training for speech therapy. Was helpful and gave her confidence. A lot of involvement in church. Does have anxiety is on Prozac, Wellbutrin.  ? ?HISTORY  ?03/02/2021 Dr. [name]: Shelly [name] is a 71 year old right-handed white female with a history of a mild memory disorder.  The patient has not given up any activities of daily living because of this.  She at times will have some difficulty remembering directions to areas that she has been before.  She manages her medications, appointments, and finances quite well.  She reports a short-term memory issue, sometimes she will walk into a room and cannot remember why she is there.  She may have some word finding difficulty, and difficulty remembering names for things.  The patient usually sleeps fairly well.  She does have some spine  pain, she has a spinal stimulator in place.  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 Sharol Given for this.  She has had multiple falls recently, most of them have been related to prosthetic failure or tripping over something.  She returns to the office today for further evaluation. ? ?REVIEW OF SYSTEMS: Out of a complete 14 system review of symptoms, the patient complains only of the following symptoms, and all other reviewed systems are negative. ? ?See HPI ? ?ALLERGIES: ?No Known Allergies ? ?HOME MEDICATIONS: ?Outpatient Medications Prior to Visit  ?Medication Sig Dispense Refill  ? Acetylcarnitine HCl (ACETYL L-CARNITINE) 500 MG CAPS Take by mouth daily.    ? acidophilus (RISAQUAD) CAPS capsule Take 1 capsule by mouth daily.    ? aspirin 81 MG EC tablet Take by mouth.    ? B Complex-Folic Acid (B COMPLEX PLUS PO) 1 tablet    ? benzonatate (TESSALON) 200 MG capsule TAKE 1 CAPSULE(200 MG) BY MOUTH THREE TIMES DAILY AS NEEDED FOR COUGH 90 capsule 2  ? Biotin 5000 MCG TABS Take 10,000 mcg by mouth every evening.    ? buPROPion (WELLBUTRIN XL) 300 MG 24 hr tablet Take 300 mg by mouth daily.    ? Calcium Carbonate (CALCIUM 600 PO) Take by mouth daily.    ? chlorpheniramine (CHLOR-TRIMETON) 4 MG tablet Take 8 mg by mouth 2 (two) times a day.     ? Cholecalciferol (VITAMIN D-3) 125 MCG (5000 UT) TABS Take by mouth daily.    ?  Cyanocobalamin (B-12 PO) Take 5,000 mcg by mouth daily.     ? denosumab (PROLIA) 60 MG/ML SOLN injection Inject 60 mg into the skin every 6 (six) months.     ? FLUoxetine (PROZAC) 20 MG capsule Take 20 mg by mouth daily.    ? losartan (COZAAR) 50 MG tablet Take 50 mg by mouth daily.     ? MAGNESIUM GLYCINATE PO Take by mouth.    ? methocarbamol (ROBAXIN) 500 MG tablet Take 500 mg by mouth every 8 (eight) hours as needed.    ? montelukast (SINGULAIR) 10 MG tablet TAKE 1 TABLET BY MOUTH EVERY DAY 90 tablet 2  ? Multiple Vitamins-Minerals  (CENTRUM SILVER PO) Take 1 tablet by mouth daily.    ? pantoprazole (PROTONIX) 40 MG tablet TAKE 1 TABLET(40 MG) BY MOUTH DAILY 30 TO 60 MINUTES BEFORE FIRST MEAL OF THE DAY 90 tablet 2  ? Potassium 99 MG TABS Take by mouth.    ? rosuvastatin (CRESTOR) 10 MG tablet Take 10 mg by mouth every evening.   1  ? SYMBICORT 80-4.5 MCG/ACT inhaler INHALE 2 PUFFS INTO THE LUNGS TWICE DAILY 10.2 g 11  ? Theanine 50 MG TBDP Take by mouth.    ? traMADol (ULTRAM) 50 MG tablet Take 50 mg by mouth 3 (three) times daily as needed.    ? Turmeric (QC TUMERIC COMPLEX PO) Take 1,400 mg by mouth daily.    ? valACYclovir (VALTREX) 1000 MG tablet Take 1,000 mg by mouth daily as needed.     ? aspirin 81 MG EC tablet TAKE 1 TABLET(81 MG) BY MOUTH DAILY 90 tablet 3  ? ?No facility-administered medications prior to visit.  ? ? ?PAST MEDICAL HISTORY: ?Past Medical History:  ?Diagnosis Date  ? Anxiety   ? Arthritis   ? Asthma   ? Avascular necrosis of talus ([place])   ? Cancer [facility])   ? vulva pre cancer had surgery  ? Depression   ? Gait abnormality 03/20/2017  ? GERD (gastroesophageal reflux disease)   ? Hearing loss   ? "very minor"  ? History of pneumonia   ? Hyperlipidemia   ? Hypertension   ? Leg edema   ? left  ? Memory difficulty 03/20/2017  ? PONV (postoperative nausea and vomiting)   ? Stress incontinence   ? ? ?PAST SURGICAL HISTORY: ?Past Surgical History:  ?Procedure Laterality Date  ? ANKLE FUSION  2011  ? right multiple   ? ANKLE FUSION    ? rear ankle fusion  ? ANKLE SURGERY  2010  ? right cordicompression  ? AORTIC ARCH ANGIOGRAPHY N/A 12/11/2017  ? Procedure: AORTIC ARCH ANGIOGRAPHY;  Surgeon: Elam Dutch, MD;  Location: [place] [facility];  Service: Cardiovascular;  Laterality: N/A;  ? APPENDECTOMY  05/10/2016  ? BELOW KNEE LEG AMPUTATION    ? CATARACT EXTRACTION W/ INTRAOCULAR LENS IMPLANT Left   ? LAPAROSCOPIC APPENDECTOMY N/A 05/10/2016  ? Procedure: APPENDECTOMY LAPAROSCOPIC;  Surgeon: Coralie Keens, MD;  Location: [place];  Service: General;  Laterality: N/A;  ? LUMBAR LAMINECTOMY/DECOMPRESSION MICRODISCECTOMY N/A 09/09/2015  ? Procedure:  L4-S1 Decompression/ Discetomy;  Surgeon: Melina Schools, MD;  Location: [place];  Service: Orthopedics;  Laterality: N/A;  ? SKIN GRAFT    ? SPINAL CORD STIMULATOR INSERTION N/A 01/10/2019  ? Procedure: LUMBAR SPINAL CORD STIMULATOR INSERTION;  Surgeon: Melina Schools, MD;  Location: [place];  Service: Orthopedics;  Laterality: N/A;  2.5 hrs  ? TONSILLECTOMY    ? TUBAL  LIGATION  1983  ? VULVECTOMY N/A 01/28/2015  ? Procedure: WIDE EXCISION VULVECTOMY;  Surgeon: Thurnell Lose, MD;  Location: [facility];  Service: Gynecology;  Laterality: N/A;  ? ? ?FAMILY HISTORY: ?Family History  ?Problem Relation Age of Onset  ? Dementia Mother 56  ?     alive  ? Fibromyalgia Mother   ? Allergies Mother   ? Heart attack Father 66  ?     deceased  ? Multiple myeloma Father   ? Heart murmur Sister 17  ?     she had open heart surgery  ? Other Sister 32  ?     She is bIpolar- diabetic  ? Breast cancer Sister   ? Other Brother   ?     alive  ? Heart disease Brother   ?     emergent CABG for 99% blocked CAD  ? ? ?SOCIAL HISTORY: ?Social History  ? ?Socioeconomic History  ? Marital status: Divorced  ?  Spouse name: Not on file  ? Number of children: 3  ? Years of education: 69  ? Highest education level: Not on file  ?Occupational History  ? Occupation: ACCOUNTING  ?  Employer: [employer]  ?Tobacco Use  ? Smoking status: Former  ?  Packs/day: 1.00  ?  Years: 40.00  ?  Pack years: 40.00  ?  Types: Cigarettes  ?  Quit date: 02/05/2007  ?  Years since quitting: 14.6  ? Smokeless tobacco: Never  ?Vaping Use  ? Vaping Use: Never used  ?Substance and Sexual Activity  ? Alcohol use: Yes  ?  Alcohol/week: 0.0 standard drinks  ?  Comment: socially  ? Drug use: No  ? Sexual activity: Not on file  ?Other Topics Concern  ? Not on file  ?Social History Narrative  ? Lives alone  ? Caffeine use: Tea every morning   ? Right handed   ? ?Social  Determinants of Health  ? ?Financial Resource Strain: Not on file  ?Food Insecurity: Not on file  ?Transportation Needs: Not on file  ?Physical Activity: Not on file  ?Stress: Not on file  ?Social Con

## 2021-09-29 ENCOUNTER — Telehealth: Payer: Self-pay | Admitting: Neurology

## 2021-09-29 NOTE — Telephone Encounter (Signed)
Referral for Neuropsychology sent to [facility] [phone]. 

## 2021-10-01 ENCOUNTER — Other Ambulatory Visit: Payer: Self-pay | Admitting: Family Medicine

## 2021-10-01 ENCOUNTER — Ambulatory Visit
Admission: RE | Admit: 2021-10-01 | Discharge: 2021-10-01 | Disposition: A | Discharge status: Home / Self Care | Payer: Medicare Other | Source: Ambulatory Visit | Attending: Family Medicine | Admitting: Family Medicine

## 2021-10-01 DIAGNOSIS — R936 Abnormal findings on diagnostic imaging of limbs: Secondary | ICD-10-CM

## 2021-10-01 DIAGNOSIS — M79662 Pain in left lower leg: Secondary | ICD-10-CM | POA: Diagnosis not present

## 2021-10-02 DIAGNOSIS — Z20828 Contact with and (suspected) exposure to other viral communicable diseases: Secondary | ICD-10-CM | POA: Diagnosis not present

## 2021-10-02 DIAGNOSIS — Z1152 Encounter for screening for COVID-19: Secondary | ICD-10-CM | POA: Diagnosis not present

## 2021-10-05 NOTE — Telephone Encounter (Signed)
Intake/Interview: 01/10/2022 ?Testing: 01/10/2022 ?Feedback: 01/24/2022 ?

## 2021-10-06 DIAGNOSIS — Z20822 Contact with and (suspected) exposure to covid-19: Secondary | ICD-10-CM | POA: Diagnosis not present

## 2021-10-07 ENCOUNTER — Ambulatory Visit (INDEPENDENT_AMBULATORY_CARE_PROVIDER_SITE_OTHER): Payer: Medicare Other | Admitting: Orthopedic Surgery

## 2021-10-07 ENCOUNTER — Encounter: Payer: Self-pay | Admitting: Orthopedic Surgery

## 2021-10-07 DIAGNOSIS — I2510 Atherosclerotic heart disease of native coronary artery without angina pectoris: Secondary | ICD-10-CM

## 2021-10-07 DIAGNOSIS — I2584 Coronary atherosclerosis due to calcified coronary lesion: Secondary | ICD-10-CM | POA: Diagnosis not present

## 2021-10-07 DIAGNOSIS — M8700 Idiopathic aseptic necrosis of unspecified bone: Secondary | ICD-10-CM | POA: Diagnosis not present

## 2021-10-07 NOTE — Progress Notes (Signed)
? ?Office Visit Note ?  ?Patient: Shelly [name]           ?Date of Birth: July 19, 1950           ?MRN: [id number] ?Visit Date: 10/07/2021 ?             ?Requested by: Carol Ada, MD ?[address] ?[address] ?[place],  [place] [address] ?PCP: Carol Ada, MD ? ?Chief Complaint  ?Patient presents with  ? Left Ankle - Pain  ? ? ? ? ?HPI: ?Patient is a 71 year old woman who is seen for initial evaluation for avascular necrosis of the left talus. ? ?Patient states she was initially treated for avascular necrosis of the right talus at [facility].  She states that Dr. Para March provided over 5 surgeries for fusion that failed.  She states she eventually required a below-knee amputation.   ? ?She has a family history of avascular necrosis.  She states the symptoms in her left ankle are exactly the same as they were in the right ankle when she began having avascular necrosis symptoms. ? ?Assessment & Plan: ?Visit Diagnoses:  ?1. AVN (avascular necrosis of bone) ([place])   ? ? ?Plan: The left ankle was injected.  We will order a CT scan to further evaluate the talus.  Patient states she cannot go through an MRI scanner due to a spinal cord stimulator that is in place.  She states she may get this taken out. ? ?Follow-Up Instructions: Return in about 4 weeks (around 11/04/2021).  ? ?Ortho Exam ? ?Patient is alert, oriented, no adenopathy, well-dressed, normal affect, normal respiratory effort. ?Examination patient does not have a palpable dorsalis pedis or posterior tibial pulse but she does have a strong biphasic dorsalis pedis and posterior tibial pulse.  She is point tender to palpation over the ankle and this reproduces her symptoms.  Review of the radiographs shows sclerotic avascular necrosis collapse of the body of the talar dome.  Patient also has pes planus with posterior tibial tendon insufficiency that appears chronic. ? ?Imaging: ?No results found. ?No images are attached to the encounter. ? ?Labs: ?Lab Results   ?Component Value Date  ? ESRSEDRATE 2 03/20/2017  ? ESRSEDRATE 10 09/08/2010  ? ? ? ?Lab Results  ?Component Value Date  ? ALBUMIN 3.2 (L) 01/11/2019  ? ? ?Lab Results  ?Component Value Date  ? MG 1.7 02/12/2015  ? ?No results found for: VD25OH ? ?No results found for: PREALBUMIN ? ?  Latest Ref Rng & Units 01/11/2019  ?  2:32 PM 01/02/2019  ?  1:55 PM 12/11/2017  ? 12:19 PM  ?CBC EXTENDED  ?WBC 4.0 - 10.5 K/uL 10.3   8.7     ?RBC 3.87 - 5.11 MIL/uL 3.47   3.91     ?Hemoglobin 12.0 - 15.0 g/dL 10.4   12.0   13.3    ?HCT 36.0 - 46.0 % 34.0   37.8   39.0    ?Platelets 150 - 400 K/uL 309   308     ?NEUT# 1.7 - 7.7 K/uL 7.0      ?Lymph# 0.7 - 4.0 K/uL 2.1      ? ? ? ?There is no height or weight on file to calculate BMI. ? ?Orders:  ?Orders Placed This Encounter  ?Procedures  ? CT ANKLE LEFT WO CONTRAST  ? ?No orders of the defined types were placed in this encounter. ? ? ? Procedures: ?No procedures performed ? ?Clinical Data: ?No additional findings. ? ?  ROS: ? ?All other systems negative, except as noted in the HPI. ?Review of Systems ? ?Objective: ?Vital Signs: LMP  (LMP Unknown)  ? ?Specialty Comments:  ?No specialty comments available. ? ?PMFS History: ?Patient Active Problem List  ? Diagnosis Date Noted  ? Community acquired pneumonia 09/16/2020  ? Upper airway cough syndrome 03/26/2019  ? Chronic pain 01/10/2019  ? MCI (mild cognitive impairment) 03/20/2017  ? Gait abnormality 03/20/2017  ? History of right below knee amputation ([place]) 07/11/2016  ? Adenomatous colon polyp 05/10/2016  ? Coronary artery calcification 01/07/2016  ? Essential hypertension 01/07/2016  ? Back pain 09/09/2015  ? Abnormal CXR 05/16/2014  ? Cough variant asthma ? assoc with [facility] 05/15/2014  ? Acute post-operative pain 02/18/2012  ? Adiposity 02/17/2012  ? Does use eyeglasses 02/13/2012  ? H/O arthrodesis 10/21/2011  ? Fibromyalgia 04/11/2011  ? HLD (hyperlipidemia) 04/11/2011  ? Avascular necrosis of talus ([place]) 04/11/2011  ? Osteomyelitis of  ankle ([place]) 04/11/2011  ? Leg edema 09/08/2010  ? ?Past Medical History:  ?Diagnosis Date  ? Anxiety   ? Arthritis   ? Asthma   ? Avascular necrosis of talus ([place])   ? Cancer [facility])   ? vulva pre cancer had surgery  ? Depression   ? Gait abnormality 03/20/2017  ? GERD (gastroesophageal reflux disease)   ? Hearing loss   ? "very minor"  ? History of pneumonia   ? Hyperlipidemia   ? Hypertension   ? Leg edema   ? left  ? Memory difficulty 03/20/2017  ? PONV (postoperative nausea and vomiting)   ? Stress incontinence   ?  ?Family History  ?Problem Relation Age of Onset  ? Dementia Mother 67  ?     alive  ? Fibromyalgia Mother   ? Allergies Mother   ? Heart attack Father 99  ?     deceased  ? Multiple myeloma Father   ? Heart murmur Sister 10  ?     she had open heart surgery  ? Other Sister 101  ?     She is bIpolar- diabetic  ? Breast cancer Sister   ? Other Brother   ?     alive  ? Heart disease Brother   ?     emergent CABG for 99% blocked CAD  ?  ?Past Surgical History:  ?Procedure Laterality Date  ? ANKLE FUSION  2011  ? right multiple   ? ANKLE FUSION    ? rear ankle fusion  ? ANKLE SURGERY  2010  ? right cordicompression  ? AORTIC ARCH ANGIOGRAPHY N/A 12/11/2017  ? Procedure: AORTIC ARCH ANGIOGRAPHY;  Surgeon: Elam Dutch, MD;  Location: [place] [facility];  Service: Cardiovascular;  Laterality: N/A;  ? APPENDECTOMY  05/10/2016  ? BELOW KNEE LEG AMPUTATION    ? CATARACT EXTRACTION W/ INTRAOCULAR LENS IMPLANT Left   ? LAPAROSCOPIC APPENDECTOMY N/A 05/10/2016  ? Procedure: APPENDECTOMY LAPAROSCOPIC;  Surgeon: Coralie Keens, MD;  Location: [place];  Service: General;  Laterality: N/A;  ? LUMBAR LAMINECTOMY/DECOMPRESSION MICRODISCECTOMY N/A 09/09/2015  ? Procedure:  L4-S1 Decompression/ Discetomy;  Surgeon: Melina Schools, MD;  Location: [facility];  Service: Orthopedics;  Laterality: N/A;  ? SKIN GRAFT    ? SPINAL CORD STIMULATOR INSERTION N/A 01/10/2019  ? Procedure: LUMBAR SPINAL CORD STIMULATOR INSERTION;  Surgeon:  Melina Schools, MD;  Location: [place];  Service: Orthopedics;  Laterality: N/A;  2.5 hrs  ? TONSILLECTOMY    ? TUBAL LIGATION  1983  ?  VULVECTOMY N/A 01/28/2015  ? Procedure: WIDE EXCISION VULVECTOMY;  Surgeon: Thurnell Lose, MD;  Location: [place] [facility];  Service: Gynecology;  Laterality: N/A;  ? ?Social History  ? ?Occupational History  ? Occupation: ACCOUNTING  ?  Employer: [employer]  ?Tobacco Use  ? Smoking status: Former  ?  Packs/day: 1.00  ?  Years: 40.00  ?  Pack years: 40.00  ?  Types: Cigarettes  ?  Quit date: 02/05/2007  ?  Years since quitting: 14.6  ? Smokeless tobacco: Never  ?Vaping Use  ? Vaping Use: Never used  ?Substance and Sexual Activity  ? Alcohol use: Yes  ?  Alcohol/week: 0.0 standard drinks  ?  Comment: socially  ? Drug use: No  ? Sexual activity: Not on file  ? ? ? ? ? ?

## 2021-10-11 ENCOUNTER — Ambulatory Visit (INDEPENDENT_AMBULATORY_CARE_PROVIDER_SITE_OTHER): Payer: Medicare Other | Admitting: Podiatry

## 2021-10-11 ENCOUNTER — Ambulatory Visit: Payer: Medicare Other | Admitting: Orthopedic Surgery

## 2021-10-11 DIAGNOSIS — M79675 Pain in left toe(s): Secondary | ICD-10-CM

## 2021-10-11 DIAGNOSIS — B351 Tinea unguium: Secondary | ICD-10-CM | POA: Diagnosis not present

## 2021-10-11 DIAGNOSIS — M79674 Pain in right toe(s): Secondary | ICD-10-CM | POA: Diagnosis not present

## 2021-10-12 DIAGNOSIS — Z20822 Contact with and (suspected) exposure to covid-19: Secondary | ICD-10-CM | POA: Diagnosis not present

## 2021-10-17 NOTE — Progress Notes (Signed)
?  Subjective:  ?Patient ID: Shelly [name], female    DOB: Feb 12, 1951,  MRN: [id number] ? ?Chief Complaint  ?Patient presents with  ? Foot Problem  ?  Nail trim- patient wants nail trim on left foot.   ? ? ?71 y.o. female presents with the above complaint. History confirmed with patient.  Her nails are becoming thickened and elongated and are now discolored and causing discomfort in shoes.  She is being worked up and diagnosed for AVN of the talus on the left side which is what she previously had on the right foot that ended up an amputation ? ?Objective:  ?Physical Exam: ?warm, good capillary refill, no trophic changes or ulcerative lesions, normal DP and PT pulses, and normal sensory exam. ?Left Foot: Thickened elongated toenails with subungual debris and discoloration ? ? ?Radiographs: ?Multiple views x-ray of the left foot: No osseous abnormalities in the area of concern there is prominence of the cuboid and fifth met base ? ? ? ? ?Assessment:  ? ?1. Pain due to onychomycosis of toenails of both feet   ? ? ? ? ?Plan:  ?Patient was evaluated and treated and all questions answered. ? ?Seems to have had an acute change in the toenail appearance I suspect this likely is onychomycosis.  We discussed treatment of this in detail including oral and topical therapy.  She would prefer to avoid medication for it and is to have routine regular nail debridements to maintain an in a comfortable fashion and I agree with this.  I will see her back in 3 months for this.  Today the nails were debrided in length and thickness using a sharp nail nipper to a tolerable level. ? ?Return in about 3 months (around 01/11/2022) for painful nails left foot .  ? ? ?

## 2021-10-26 ENCOUNTER — Other Ambulatory Visit: Payer: Self-pay | Admitting: Internal Medicine

## 2021-10-28 ENCOUNTER — Ambulatory Visit: Payer: Medicare Other | Admitting: Neurology

## 2021-10-29 ENCOUNTER — Other Ambulatory Visit: Payer: Self-pay | Admitting: Interventional Cardiology

## 2021-10-29 DIAGNOSIS — K219 Gastro-esophageal reflux disease without esophagitis: Secondary | ICD-10-CM | POA: Diagnosis not present

## 2021-10-29 DIAGNOSIS — E785 Hyperlipidemia, unspecified: Secondary | ICD-10-CM | POA: Diagnosis not present

## 2021-10-29 DIAGNOSIS — I10 Essential (primary) hypertension: Secondary | ICD-10-CM | POA: Diagnosis not present

## 2021-10-29 DIAGNOSIS — J45991 Cough variant asthma: Secondary | ICD-10-CM | POA: Diagnosis not present

## 2021-11-04 ENCOUNTER — Telehealth: Payer: Self-pay | Admitting: Internal Medicine

## 2021-11-04 NOTE — Telephone Encounter (Signed)
The pharmacist, Nicki Reaper, from [facility]/ Dr. Thompson Caul office dropped off an [employer]ME form for Dr. Melvyn Novas to fill out for patient's Symbicort. Nicki Reaper would like the form faxed back to him, at [phone], once complete. Form placed in Dr. Gustavus Bryant box.  Please advise.

## 2021-11-05 ENCOUNTER — Other Ambulatory Visit: Payer: Medicare Other

## 2021-11-08 ENCOUNTER — Ambulatory Visit: Payer: Medicare Other | Admitting: Orthopedic Surgery

## 2021-11-09 NOTE — Telephone Encounter (Signed)
Form signed and faxed to [facility] at the number he provided.

## 2021-11-11 ENCOUNTER — Telehealth: Payer: Self-pay | Admitting: Internal Medicine

## 2021-11-11 NOTE — Telephone Encounter (Signed)
Noted. Patient assistance forms noted in Dr Morrison Old file. Nothing further needed

## 2021-11-18 ENCOUNTER — Ambulatory Visit
Admission: RE | Admit: 2021-11-18 | Discharge: 2021-11-18 | Disposition: A | Discharge status: Home / Self Care | Payer: Medicare Other | Source: Ambulatory Visit | Attending: Orthopedic Surgery | Admitting: Orthopedic Surgery

## 2021-11-18 ENCOUNTER — Telehealth: Payer: Self-pay | Admitting: Internal Medicine

## 2021-11-18 DIAGNOSIS — M8700 Idiopathic aseptic necrosis of unspecified bone: Secondary | ICD-10-CM

## 2021-11-18 DIAGNOSIS — R2242 Localized swelling, mass and lump, left lower limb: Secondary | ICD-10-CM | POA: Diagnosis not present

## 2021-11-18 DIAGNOSIS — M7732 Calcaneal spur, left foot: Secondary | ICD-10-CM | POA: Diagnosis not present

## 2021-11-18 DIAGNOSIS — Z8679 Personal history of other diseases of the circulatory system: Secondary | ICD-10-CM | POA: Diagnosis not present

## 2021-11-18 DIAGNOSIS — M19072 Primary osteoarthritis, left ankle and foot: Secondary | ICD-10-CM | POA: Diagnosis not present

## 2021-11-19 NOTE — Telephone Encounter (Signed)
Looked in MW's cubby, did not see any forms for this patient. Forms have not been scanned into her chart.   Will need to call [employer] on Monday since it is after 5pm.

## 2021-11-22 ENCOUNTER — Encounter: Payer: Self-pay | Admitting: Orthopedic Surgery

## 2021-11-22 ENCOUNTER — Ambulatory Visit (INDEPENDENT_AMBULATORY_CARE_PROVIDER_SITE_OTHER): Payer: Medicare Other | Admitting: Orthopedic Surgery

## 2021-11-22 DIAGNOSIS — I2510 Atherosclerotic heart disease of native coronary artery without angina pectoris: Secondary | ICD-10-CM

## 2021-11-22 DIAGNOSIS — Z89511 Acquired absence of right leg below knee: Secondary | ICD-10-CM

## 2021-11-22 DIAGNOSIS — I2584 Coronary atherosclerosis due to calcified coronary lesion: Secondary | ICD-10-CM | POA: Diagnosis not present

## 2021-11-22 DIAGNOSIS — M958 Other specified acquired deformities of musculoskeletal system: Secondary | ICD-10-CM

## 2021-11-22 DIAGNOSIS — M8700 Idiopathic aseptic necrosis of unspecified bone: Secondary | ICD-10-CM | POA: Diagnosis not present

## 2021-11-22 NOTE — Progress Notes (Signed)
Office Visit Note   Patient: Shelly [name]           Date of Birth: 10-13-1950           MRN: [id number] Visit Date: 11/22/2021              Requested by: [name], MD [phone] [address] [place],  [place] [address] PCP: [name], MD  Chief Complaint  Patient presents with   Left Ankle - Follow-up    Review CT scan       HPI: Patient is a 71-when symptomatic or possibly arthroscopic debridement.year-old woman who presents in follow-up for her left ankle.  Patient has had a family history of avascular necrosis eventually underwent transtibial amputation on the right for avascular necrosis of the right ankle.  Patient complains of pain and callus on the plantar lateral aspect of the left foot as well as pain over the left ankle.  She states that after the steroid injection her symptoms have essentially completely resolved.  Assessment & Plan: Visit Diagnoses:  1. AVN (avascular necrosis of bone) (HCC)   2. History of right below knee amputation (HCC)   3. Osteochondral defect of ankle     Plan: Discussed the patient does not have avascular necrosis of the left ankle she does have an osteochondral defect with the good relief results with the injection could proceed with an additional injection  Follow-Up Instructions: Return if symptoms worsen or fail to improve.   Ortho Exam  Patient is alert, oriented, no adenopathy, well-dressed, normal affect, normal respiratory effort. Examination of the left ankle she has good pulses she has good range of motion of the ankle she has dorsiflexion to neutral there is atrophy of the fat pad with prominence over the lateral aspect of her foot involving the cuboid and the base of the fifth metatarsal.  Recommended a stiff soled supportive sneakers such as a [place]oka.  Review of the CT scan shows an osteochondral defect of the medial talar dome.  No evidence of AVN.  Imaging: No results found. No images are attached to the  encounter.  Labs: Lab Results  Component Value Date   ESRSEDRATE 2 03/20/2017   ESRSEDRATE 10 09/08/2010     Lab Results  Component Value Date   ALBUMIN 3.2 (L) 01/11/2019    Lab Results  Component Value Date   MG 1.7 02/12/2015   No results found for: "VD25OH"  No results found for: "PREALBUMIN"    Latest Ref Rng & Units 01/11/2019    2:32 PM 01/02/2019    1:55 PM 12/11/2017   12:19 PM  CBC EXTENDED  WBC 4.0 - 10.5 K/uL 10.3  8.7    RBC 3.87 - 5.11 MIL/uL 3.47  3.91    Hemoglobin 12.0 - 15.0 g/dL 68.5  55.7  37.1   HCT 36.0 - 46.0 % 34.0  37.8  39.0   Platelets 150 - 400 K/uL 309  308    NEUT# 1.7 - 7.7 K/uL 7.0     Lymph# 0.7 - 4.0 K/uL 2.1        There is no height or weight on file to calculate BMI.  Orders:  No orders of the defined types were placed in this encounter.  No orders of the defined types were placed in this encounter.    Procedures: No procedures performed  Clinical Data: No additional findings.  ROS:  All other systems negative, except as noted in the HPI. Review  of Systems  Objective: Vital Signs: LMP  (LMP Unknown)   Specialty Comments:  No specialty comments available.  PMFS History: Patient Active Problem List   Diagnosis Date Noted   Community acquired pneumonia 09/16/2020   Upper airway cough syndrome 03/26/2019   Chronic pain 01/10/2019   MCI (mild cognitive impairment) 03/20/2017   Gait abnormality 03/20/2017   History of right below knee amputation ([place]) 07/11/2016   Adenomatous colon polyp 05/10/2016   Coronary artery calcification 01/07/2016   Essential hypertension 01/07/2016   Back pain 09/09/2015   Abnormal CXR 05/16/2014   Cough variant asthma ? assoc with UACS 05/15/2014   Acute post-operative pain 02/18/2012   Adiposity 02/17/2012   Does use eyeglasses 02/13/2012   H/O arthrodesis 10/21/2011   Fibromyalgia 04/11/2011   HLD (hyperlipidemia) 04/11/2011   Avascular necrosis of talus ([place]) 04/11/2011    Osteomyelitis of ankle ([place]) 04/11/2011   Leg edema 09/08/2010   Past Medical History:  Diagnosis Date   Anxiety    Arthritis    Asthma    Avascular necrosis of talus ([place])    Cancer ([place])    vulva pre cancer had surgery   Depression    Gait abnormality 03/20/2017   GERD (gastroesophageal reflux disease)    Hearing loss    "very minor"   History of pneumonia    Hyperlipidemia    Hypertension    Leg edema    left   Memory difficulty 03/20/2017   PONV (postoperative nausea and vomiting)    Stress incontinence     Family History  Problem Relation Age of Onset   Dementia Mother 53       alive   Fibromyalgia Mother    Allergies Mother    Heart attack Father 39       deceased   Multiple myeloma Father    Heart murmur Sister 56       she had open heart surgery   Other Sister 48       She is bIpolar- diabetic   Breast cancer Sister    Other Brother        alive   Heart disease Brother        emergent CABG for 99% blocked CAD    Past Surgical History:  Procedure Laterality Date   ANKLE FUSION  2011   right multiple    ANKLE FUSION     rear ankle fusion   ANKLE SURGERY  2010   right cordicompression   AORTIC ARCH ANGIOGRAPHY N/A 12/11/2017   Procedure: AORTIC ARCH ANGIOGRAPHY;  Surgeon: Elam Dutch, MD;  Location: [facility];  Service: Cardiovascular;  Laterality: N/A;   APPENDECTOMY  05/10/2016   BELOW KNEE LEG AMPUTATION     CATARACT EXTRACTION W/ INTRAOCULAR LENS IMPLANT Left    LAPAROSCOPIC APPENDECTOMY N/A 05/10/2016   Procedure: APPENDECTOMY LAPAROSCOPIC;  Surgeon: Coralie Keens, MD;  Location: [facility];  Service: General;  Laterality: N/A;   LUMBAR LAMINECTOMY/DECOMPRESSION MICRODISCECTOMY N/A 09/09/2015   Procedure:  L4-S1 Decompression/ Discetomy;  Surgeon: Melina Schools, MD;  Location: [facility];  Service: Orthopedics;  Laterality: N/A;   SKIN GRAFT     SPINAL CORD STIMULATOR INSERTION N/A 01/10/2019   Procedure: LUMBAR SPINAL CORD STIMULATOR  INSERTION;  Surgeon: Melina Schools, MD;  Location: [facility];  Service: Orthopedics;  Laterality: N/A;  2.5 hrs   TONSILLECTOMY     TUBAL LIGATION  1983   VULVECTOMY N/A 01/28/2015   Procedure: WIDE EXCISION VULVECTOMY;  Surgeon:  Thurnell Lose, MD;  Location: [facility];  Service: Gynecology;  Laterality: N/A;   Social History   Occupational History   Occupation: ACCOUNTING    Employer: [employer]  Tobacco Use   Smoking status: Former    Packs/day: 1.00    Years: 40.00    Total pack years: 40.00    Types: Cigarettes    Quit date: 02/05/2007    Years since quitting: 14.8   Smokeless tobacco: Never  Vaping Use   Vaping Use: Never used  Substance and Sexual Activity   Alcohol use: Yes    Alcohol/week: 0.0 standard drinks of alcohol    Comment: socially   Drug use: No   Sexual activity: Not on file

## 2021-11-22 NOTE — Telephone Encounter (Signed)
Called Scott at [facility] since the forms had been faxed back to him but he was not available. I left a message for him to call us back.   Called and spoke with patient. She is aware that we are still working on this. She stated that the letter she received from [employer] stated they were missing information on the application. I advised her that as soon as I hear back from [facility] I would let her know.   Will keep this encounter open for follow up.

## 2021-11-23 NOTE — Telephone Encounter (Signed)
Courtney from [facility] called back and states form was faxed over yesterday at 1pm- please advise. Call back number is [phone].

## 2021-11-23 NOTE — Telephone Encounter (Signed)
Called [facility] but she was not available. Will call back later.

## 2021-11-26 ENCOUNTER — Telehealth: Payer: Self-pay | Admitting: Internal Medicine

## 2021-11-29 MED ORDER — PANTOPRAZOLE SODIUM 40 MG PO TBEC: DELAYED_RELEASE_TABLET | ORAL | 1 refills | Status: DC

## 2021-12-02 NOTE — Telephone Encounter (Signed)
Attempted to call pt to see if everything had been taken care of for the pt assistance application but unable to reach pt. Left message for pt to return call.

## 2021-12-08 ENCOUNTER — Telehealth: Payer: Self-pay | Admitting: Internal Medicine

## 2021-12-08 MED ORDER — BENZONATATE 200 MG PO CAPS: ORAL_CAPSULE | ORAL | 2 refills | Status: DC

## 2021-12-08 NOTE — Telephone Encounter (Signed)
Called and spoke with patient who needs to be scheduled for follow up and is requesting a refill on Benzonatate. Refills have been sent in and she has been scheduled for follow up. Nothing further needed at this time.

## 2021-12-20 DIAGNOSIS — M5412 Radiculopathy, cervical region: Secondary | ICD-10-CM | POA: Diagnosis not present

## 2021-12-20 DIAGNOSIS — M545 Low back pain, unspecified: Secondary | ICD-10-CM | POA: Diagnosis not present

## 2021-12-20 DIAGNOSIS — M5416 Radiculopathy, lumbar region: Secondary | ICD-10-CM | POA: Diagnosis not present

## 2021-12-20 DIAGNOSIS — M5136 Other intervertebral disc degeneration, lumbar region: Secondary | ICD-10-CM | POA: Diagnosis not present

## 2022-01-10 DIAGNOSIS — R413 Other amnesia: Secondary | ICD-10-CM | POA: Diagnosis not present

## 2022-01-17 ENCOUNTER — Ambulatory Visit (INDEPENDENT_AMBULATORY_CARE_PROVIDER_SITE_OTHER): Payer: Medicare Other | Admitting: Podiatry

## 2022-01-17 ENCOUNTER — Telehealth: Payer: Self-pay | Admitting: Neurology

## 2022-01-17 DIAGNOSIS — M79674 Pain in right toe(s): Secondary | ICD-10-CM | POA: Diagnosis not present

## 2022-01-17 DIAGNOSIS — M79675 Pain in left toe(s): Secondary | ICD-10-CM

## 2022-01-17 DIAGNOSIS — B351 Tinea unguium: Secondary | ICD-10-CM | POA: Diagnosis not present

## 2022-01-17 MED ORDER — CICLOPIROX 8 % EX SOLN: Freq: Every day | CUTANEOUS | 0 refills | Status: DC

## 2022-01-17 NOTE — Telephone Encounter (Signed)
Received neuropsychological evaluation from Dr. Cecelia Byars 01/10/22, consistent with unspecified mild cognitive impairment that is likely multifactorial.  Neurodegenerative process cannot fully be ruled out, but there is likely contribution from recent fractionated sleep patterns, her chronic pain, generalized anxiety, mood issues.  Could also be a vascular component given evidence of executive dysfunction on testing and presence of vascular risk factors and her medical history. Recommended repeat neuropsychological testing in 12 to 18 months.  Recommendations -Hearing evaluation with audiologist -Better sleep habits -Sleep apnea, OSA? -Recommended psychotherapy -Staying active, brain healthy diet

## 2022-01-18 NOTE — Progress Notes (Signed)
  Subjective:  Patient ID: Shelly [name], female    DOB: [birth year],  MRN: [id number]  Chief Complaint  Patient presents with   Nail Problem    Patient states toenails are long, thick, difficult to trim and painful    71 y.o. female presents with the above complaint. History confirmed with patient.  Ankle is doing better.  The nails are continuing to become more more discolored.  Objective:  Physical Exam: warm, good capillary refill, no trophic changes or ulcerative lesions, normal DP and PT pulses, and normal sensory exam. Left Foot: Thickened elongated toenails with subungual debris and discoloration   Radiographs: Multiple views x-ray of the left foot: No osseous abnormalities in the area of concern there is prominence of the cuboid and fifth met base     Assessment:   1. Pain due to onychomycosis of toenails of both feet       Plan:  Patient was evaluated and treated and all questions answered.  Nails continue to show progression that appears to be yellow discolored thickening that is consistent with onychomycosis.  Rx for Penlac sent to pharmacy reviewed its use and application.  The nails were read in length and thickness using a sharp nail nipper today.  I will see her back in 3 months for follow-up.  We also discussed laser treatment for this as well if it does not improve with topical therapy  Return in about 3 months (around 04/19/2022) for painful nail fungus.

## 2022-01-20 DIAGNOSIS — M810 Age-related osteoporosis without current pathological fracture: Secondary | ICD-10-CM | POA: Diagnosis not present

## 2022-01-24 ENCOUNTER — Encounter: Payer: Self-pay | Admitting: Orthopedic Surgery

## 2022-01-24 ENCOUNTER — Telehealth: Payer: Self-pay | Admitting: Internal Medicine

## 2022-01-24 DIAGNOSIS — J45991 Cough variant asthma: Secondary | ICD-10-CM | POA: Diagnosis not present

## 2022-01-24 DIAGNOSIS — K219 Gastro-esophageal reflux disease without esophagitis: Secondary | ICD-10-CM | POA: Diagnosis not present

## 2022-01-24 DIAGNOSIS — I10 Essential (primary) hypertension: Secondary | ICD-10-CM | POA: Diagnosis not present

## 2022-01-24 DIAGNOSIS — E785 Hyperlipidemia, unspecified: Secondary | ICD-10-CM | POA: Diagnosis not present

## 2022-01-24 DIAGNOSIS — N1831 Chronic kidney disease, stage 3a: Secondary | ICD-10-CM | POA: Diagnosis not present

## 2022-01-24 DIAGNOSIS — F325 Major depressive disorder, single episode, in full remission: Secondary | ICD-10-CM | POA: Diagnosis not present

## 2022-01-24 NOTE — Telephone Encounter (Signed)
[facility] phone number on file and got no answer. Will try again.

## 2022-01-31 DIAGNOSIS — R413 Other amnesia: Secondary | ICD-10-CM | POA: Diagnosis not present

## 2022-02-03 ENCOUNTER — Ambulatory Visit (INDEPENDENT_AMBULATORY_CARE_PROVIDER_SITE_OTHER): Payer: Medicare Other | Admitting: Orthopedic Surgery

## 2022-02-03 ENCOUNTER — Ambulatory Visit (INDEPENDENT_AMBULATORY_CARE_PROVIDER_SITE_OTHER): Payer: Medicare Other

## 2022-02-03 ENCOUNTER — Ambulatory Visit: Payer: Self-pay

## 2022-02-03 ENCOUNTER — Encounter: Payer: Self-pay | Admitting: Orthopedic Surgery

## 2022-02-03 DIAGNOSIS — M79605 Pain in left leg: Secondary | ICD-10-CM | POA: Diagnosis not present

## 2022-02-03 DIAGNOSIS — I2510 Atherosclerotic heart disease of native coronary artery without angina pectoris: Secondary | ICD-10-CM

## 2022-02-03 DIAGNOSIS — M79604 Pain in right leg: Secondary | ICD-10-CM

## 2022-02-03 DIAGNOSIS — M25561 Pain in right knee: Secondary | ICD-10-CM

## 2022-02-03 DIAGNOSIS — M5136 Other intervertebral disc degeneration, lumbar region: Secondary | ICD-10-CM

## 2022-02-03 DIAGNOSIS — I2584 Coronary atherosclerosis due to calcified coronary lesion: Secondary | ICD-10-CM | POA: Diagnosis not present

## 2022-02-03 DIAGNOSIS — M25562 Pain in left knee: Secondary | ICD-10-CM | POA: Diagnosis not present

## 2022-02-03 DIAGNOSIS — G8929 Other chronic pain: Secondary | ICD-10-CM

## 2022-02-03 DIAGNOSIS — Z89511 Acquired absence of right leg below knee: Secondary | ICD-10-CM

## 2022-02-03 NOTE — Progress Notes (Signed)
Office Visit Note   Patient: Shelly [name]           Date of Birth: [birth year]           MRN: [id number] Visit Date: 02/03/2022              Requested by: Carol Ada, [address] [place],  [place] [address] PCP: Carol Ada, MD  Chief Complaint  Patient presents with   Right Knee - Pain   Left Knee - Pain   Lower Back - Pain      HPI: Patient is a 71 year old woman who presents with chronic lower back pain she is status post surgery with Dr. Rolena Infante around 2017 she has had a spinal cord stimulator placed as well she states that this does not provide any relief.  Patient states that she has a rash on the end of the residual limb right transtibial amputation.  Patient complains of bilateral knee pain with decreased range of motion with medial joint line pain worse going up and down stairs.  Patient states she also has bilateral hip pain worse with walking denies any groin pain states the pain is posterior.  Assessment & Plan: Visit Diagnoses:  1. Bilateral leg pain   2. Chronic pain of both knees   3. Degenerative disc disease, lumbar   4. History of right below knee amputation ([place])     Plan: Both knees were injected she tolerated this well.  Recommended wearing a under liner Vive shrinker to help with the dermatitis.  Recommended stiff soled sneakers for the rocker-bottom deformity of the left foot.  Follow-Up Instructions: Return if symptoms worsen or fail to improve.   Ortho Exam  Patient is alert, oriented, no adenopathy, well-dressed, normal affect, normal respiratory effort. Examination patient has no pain with range of motion of her knees there is no effusion she is tender to palpation over the medial joint line bilaterally.  Patient's lumbar spine shows advanced degenerative disc disease she has no radicular symptoms no lower extremity weakness.  She has no groin pain radiograph shows no arthritis of the hips.  Imaging: XR Lumbar Spine 2-3  Views  Result Date: 02/03/2022 2 view radiographs of the lumbar spine shows advanced degenerative disc disease with complete collapse throughout the lumbar spine there is a spinal stimulator in place she has calcification of the aorta with a diameter of 2.5 cm.  The visualized portion of her hip shows no pathology  XR Knee 1-2 Views Left  Result Date: 02/03/2022 2 view radiographs of the left knee shows mild medial joint line narrowing.  XR Knee 1-2 Views Right  Result Date: 02/03/2022 2 view radiographs of the right knee shows mild medial joint line narrowing.  No images are attached to the encounter.  Labs: Lab Results  Component Value Date   ESRSEDRATE 2 03/20/2017   ESRSEDRATE 10 09/08/2010     Lab Results  Component Value Date   ALBUMIN 3.2 (L) 01/11/2019    Lab Results  Component Value Date   MG 1.7 02/12/2015   No results found for: "VD25OH"  No results found for: "PREALBUMIN"    Latest Ref Rng & Units 01/11/2019    2:32 PM 01/02/2019    1:55 PM 12/11/2017   12:19 PM  CBC EXTENDED  WBC 4.0 - 10.5 K/uL 10.3  8.7    RBC 3.87 - 5.11 MIL/uL 3.47  3.91    Hemoglobin 12.0 - 15.0 g/dL 10.4  12.0  13.3   HCT 36.0 - 46.0 % 34.0  37.8  39.0   Platelets 150 - 400 K/uL 309  308    NEUT# 1.7 - 7.7 K/uL 7.0     Lymph# 0.7 - 4.0 K/uL 2.1        There is no height or weight on file to calculate BMI.  Orders:  Orders Placed This Encounter  Procedures   XR Knee 1-2 Views Right   XR Knee 1-2 Views Left   XR Lumbar Spine 2-3 Views   No orders of the defined types were placed in this encounter.    Procedures: Large Joint Inj: bilateral knee on 02/03/2022 4:11 PM Indications: pain and diagnostic evaluation Details: 22 G 1.5 in needle, anteromedial approach  Arthrogram: No  Outcome: tolerated well, no immediate complications Procedure, treatment alternatives, risks and benefits explained, specific risks discussed. Consent was given by the patient. Immediately prior to  procedure a time out was called to verify the correct patient, procedure, equipment, support staff and site/side marked as required. Patient was prepped and draped in the usual sterile fashion.      Clinical Data: No additional findings.  ROS:  All other systems negative, except as noted in the HPI. Review of Systems  Objective: Vital Signs: LMP  (LMP Unknown)   Specialty Comments:  No specialty comments available.  PMFS History: Patient Active Problem List   Diagnosis Date Noted   Community acquired pneumonia 09/16/2020   Upper airway cough syndrome 03/26/2019   Chronic pain 01/10/2019   MCI (mild cognitive impairment) 03/20/2017   Gait abnormality 03/20/2017   History of right below knee amputation ([facility]) 07/11/2016   Adenomatous colon polyp 05/10/2016   Coronary artery calcification 01/07/2016   Essential hypertension 01/07/2016   Back pain 09/09/2015   Abnormal CXR 05/16/2014   Cough variant asthma ? assoc with UACS 05/15/2014   Acute post-operative pain 02/18/2012   Adiposity 02/17/2012   Does use eyeglasses 02/13/2012   H/O arthrodesis 10/21/2011   Fibromyalgia 04/11/2011   HLD (hyperlipidemia) 04/11/2011   Avascular necrosis of talus ([facility]) 04/11/2011   Osteomyelitis of ankle ([facility]) 04/11/2011   Leg edema 09/08/2010   Past Medical History:  Diagnosis Date   Anxiety    Arthritis    Asthma    Avascular necrosis of talus ([facility])    Cancer ([facility])    vulva pre cancer had surgery   Depression    Gait abnormality 03/20/2017   GERD (gastroesophageal reflux disease)    Hearing loss    "very minor"   History of pneumonia    Hyperlipidemia    Hypertension    Leg edema    left   Memory difficulty 03/20/2017   PONV (postoperative nausea and vomiting)    Stress incontinence     Family History  Problem Relation Age of Onset   Dementia Mother 27       alive   Fibromyalgia Mother    Allergies Mother    Heart attack Father 23       deceased   Multiple myeloma  Father    Heart murmur Sister 77       she had open heart surgery   Other Sister 78       She is bIpolar- diabetic   Breast cancer Sister    Other Brother        alive   Heart disease Brother        emergent CABG for 99% blocked CAD  Past Surgical History:  Procedure Laterality Date   ANKLE FUSION  2011   right multiple    ANKLE FUSION     rear ankle fusion   ANKLE SURGERY  2010   right cordicompression   AORTIC ARCH ANGIOGRAPHY N/A 12/11/2017   Procedure: AORTIC ARCH ANGIOGRAPHY;  Surgeon: Elam Dutch, MD;  Location: [facility];  Service: Cardiovascular;  Laterality: N/A;   APPENDECTOMY  05/10/2016   BELOW KNEE LEG AMPUTATION     CATARACT EXTRACTION W/ INTRAOCULAR LENS IMPLANT Left    LAPAROSCOPIC APPENDECTOMY N/A 05/10/2016   Procedure: APPENDECTOMY LAPAROSCOPIC;  Surgeon: Coralie Keens, MD;  Location: [facility];  Service: General;  Laterality: N/A;   LUMBAR LAMINECTOMY/DECOMPRESSION MICRODISCECTOMY N/A 09/09/2015   Procedure:  L4-S1 Decompression/ Discetomy;  Surgeon: Melina Schools, MD;  Location: [facility];  Service: Orthopedics;  Laterality: N/A;   SKIN GRAFT     SPINAL CORD STIMULATOR INSERTION N/A 01/10/2019   Procedure: LUMBAR SPINAL CORD STIMULATOR INSERTION;  Surgeon: Melina Schools, MD;  Location: [facility];  Service: Orthopedics;  Laterality: N/A;  2.5 hrs   [place] N/A 01/28/2015   Procedure: WIDE EXCISION VULVECTOMY;  Surgeon: Thurnell Lose, MD;  Location: [facility];  Service: Gynecology;  Laterality: N/A;   Social History   Occupational History   Occupation: ACCOUNTING    Employer: [employer]  Tobacco Use   Smoking status: Former    Packs/day: 1.00    Years: 40.00    Total pack years: 40.00    Types: Cigarettes    Quit date: 02/05/2007    Years since quitting: 15.0   Smokeless tobacco: Never  Vaping Use   Vaping Use: Never used  Substance and Sexual Activity   Alcohol use: Yes    Alcohol/week: 0.0 standard  drinks of alcohol    Comment: socially   Drug use: No   Sexual activity: Not on file

## 2022-02-03 NOTE — Telephone Encounter (Signed)
Tried calling the number provided for pt. There was no answer. Closing per protocol.

## 2022-02-08 ENCOUNTER — Encounter: Payer: Self-pay | Admitting: Orthopedic Surgery

## 2022-02-10 DIAGNOSIS — L858 Other specified epidermal thickening: Secondary | ICD-10-CM | POA: Diagnosis not present

## 2022-02-10 DIAGNOSIS — L97811 Non-pressure chronic ulcer of other part of right lower leg limited to breakdown of skin: Secondary | ICD-10-CM | POA: Diagnosis not present

## 2022-02-17 ENCOUNTER — Ambulatory Visit: Payer: Medicare Other | Admitting: Neurology

## 2022-02-17 DIAGNOSIS — R413 Other amnesia: Secondary | ICD-10-CM | POA: Diagnosis not present

## 2022-02-20 NOTE — Progress Notes (Unsigned)
Subjective:    Patient ID: Shelly [name], female    DOB: 1951/05/03       MRN: [id number]    Brief patient profile:  69 yowf mother  of optimetrist  quit smoking 2008  watery eyes in her 91s year round ? Worse in winter eventually started on zyrtec 2013 and seemed to help s assoc cough/ wheeze then new onset cough Aug 2015 and proved refractory so referred to pulmonary clinic 05/15/14  by Dr Jeremy Johann    History of Present Illness  05/15/2014 1st [facility] office visit/ Zarif Rathje   Chief Complaint  Patient presents with   Pulmonary Consult    Referred by Dr. Carol Ada. Pt c/o cough since August 2015- cough is non prod and somtimes seems worse at night. She also c/o DOE with walking up stairs.    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   Prednisone 10 mg take  4 each am x 2 days,   2 each am x 2 days,  1 each am x 2 days and stop (this is to eliminate allergies and inflammation from coughing) Omeprazole 40 mg  Take 30-60 min before first meal of the day and Pepcid 20 mg one bedtime plus chlorpheniramine 4 mg x 2 at bedtime  GERD diet reviewed     05/28/2014 f/u ov/Toby Breithaupt re: cough since aug 2015 Chief Complaint  Patient presents with   Acute Visit    Pt states that after last visit her cough had pretty much resolved, but then started back about 1 wk ago.  Cough is non prod and "comes and goes".   95% better, still urge to  clear throat daytime on above rx/ confused about when to d/c what  No noct cough  at all  Sob doe x steps better Then gradually worse x one week , again dry and variable   Kouffman Reflux v Neurogenic Cough Differentiator Reflux Comments  Do you awaken from a sound sleep coughing violently?                            With trouble breathing? Not now   Do you have choking episodes when you cannot  Get enough air, gasping for air ?              Not now   Do you usually cough when you lie down into  The bed, or when you just lie down to rest ?                          Not now    Do you usually cough after meals or eating?         No   Do you cough when (or after) you bend over?    Still some  GERD SCORE     Kouffman Reflux v Neurogenic Cough Differentiator Neurogenic   Do you more-or-less cough all day long? no   Does change of temperature make you cough? no   Does laughing or chuckling cause you to cough? Still some   Do fumes (perfume, automobile fumes, burned  Toast, etc.,) cause you to cough ?      no   Does speaking, singing, or talking on the phone cause you to cough   ?               Not now   Neurogenic/Airway score       rec Prednisone 10 mg take  4 each am x 2 days,   2 each am x 2 days,  1 each am x 2 days and stop  Take Tessalon 4x daily  and supplement if needed with  tramadol 50 mg up to 2 every 4 hours to suppress the urge to cough. Swallowing water or using ice chips/non mint and menthol containing candies (such as lifesavers or sugarless jolly ranchers) are also effective.  You should rest your voice and avoid activities that you know make you cough. Once you have eliminated the cough for 3 straight days try reducing the tramadol first,  then the tessalon as tolerated Please see patient coordinator before you leave today  to schedule sinus and chest CT and fluoro > neg       10/18/2018   Pulmonary/ re-estabish/Amit Leece re:  Tendency to cough sporadic pattern x years,  recurrent since March 2020 while on singulair  Chief Complaint  Patient presents with   Pulmonary Consult    Self referral.  Pt states has had increased cough x 2 months.  Cough is currently non prod. She has not had to use her proair in the past wk.     was maint on singulair,  h1 x 2 hs and no gerd rx then acutely worse x 2 months s obvious trigger Typically year round pattern is that every few weeks the cough starts back up  Dyspnea:  MMRC1 = even on best edays  can walk nl pace, flat grade, can't hurry or go uphills or steps s sob   Cough: mostly dry / gag ? Sometime vomit when cough is severe and "almost pass out"  Sleeping: bed is flat / cough was bad at night better now  SABA use: saba helped as does prednisone  02: 02  Using lots of mints  acei intol ? When  d/c'd (started summer 2019)  rec Plan A = Automatic =Dulera 100 Take 2 puffs first thing in am and then another 2 puffs about 12 hours later and continue singulair 10 mg each pm  Work on inhaler technique:   Plan B = Backup Only use your albuterol inhaler as a rescue medication  Whenever cough start  prilosec 20 mg Take 30- 60 min before your first and last meals of the day until no longer coughing and  For drainage / throat tickle try take CHLORPHENIRAMINE  4 mg  (Chlortab '4mg'$   at [employer] should be easiest to find in the green box)  take one every 4 hours as needed - available over the counter- may cause drowsiness so start with just a bedtime dose or two and see how you tolerate it before trying in daytime        03/22/2021  f/u ov/Misti Towle re: cough variant asthma/ uacs  maint on ppi  ac and symbicort  80 prn Chief Complaint  Patient presents with   Follow-up    Sob: during exertion. Mostly the throat clearing has been bothering patient.   Dyspnea:  MMRC1 = can walk nl pace, flat grade, can't hurry or go uphills or steps s sob   Cough: wakes up then starts clearing throat  but no excess mucus  Sleeping: no resp cc  flat on side with pillows  SABA use: none  02: none  Covid status:   vax x 2   Rec If cough worse >>> Add either  a dose of prilosec otc 30 min before or pepcid 20 mg after supper    02/21/2022  f/u ov/Lilyian Quayle re: uacs vs cough variant asthma   maint on symbiocrt 80  avg not even q week/ seems to correlete with eating chilly / protonix 40 mg  not taking ac  Chief Complaint  Patient presents with   Follow-up    Some coughing at times   Dyspnea: MMRC1 = can walk nl pace, flat grade, can't hurry or go uphills or steps s sob   Cough: ok now, usually dry  Sleeping: no resp cc SABA use: none  02: none      No obvious day to day or daytime variability or assoc excess/ purulent sputum or mucus plugs or hemoptysis or cp or chest tightness, subjective wheeze or overt sinus or hb symptoms.   Sleeping  without nocturnal  or early am exacerbation  of respiratory  c/o's or need for noct saba. Also denies any obvious fluctuation of symptoms with weather or environmental changes or other aggravating or alleviating factors except as outlined above   No unusual exposure hx or h/o childhood pna/ asthma or knowledge of premature birth.  Current Allergies, Complete Past Medical History, Past Surgical History, Family History, and Social History were reviewed in [employer] record.  ROS  The following are not active complaints unless bolded Hoarseness, sore throat, dysphagia, dental problems, itching, sneezing,  nasal congestion or discharge of excess mucus or purulent secretions, ear ache,   fever, chills, sweats, unintended wt loss or wt gain, classically pleuritic or exertional cp,  orthopnea pnd or arm/hand swelling  or leg swelling, presyncope, palpitations, abdominal pain, anorexia, nausea, vomiting, diarrhea  or change in bowel habits or change in bladder habits, change in stools or change in urine, dysuria, hematuria,  rash, arthralgias, visual complaints, headache, numbness,  weakness or ataxia or problems with walking or coordination,  change in mood or  memory.        Current Meds  Medication Sig   Acetylcarnitine HCl (ACETYL L-CARNITINE) 500 MG CAPS Take by mouth daily.   acidophilus (RISAQUAD) CAPS capsule Take 1 capsule by mouth daily.   aspirin EC 81 MG tablet TAKE 1 TABLET(81 MG) BY MOUTH DAILY   B Complex-Folic Acid (B COMPLEX PLUS PO) 1 tablet   benzonatate (TESSALON) 200 MG capsule TAKE 1 CAPSULE(200 MG) BY MOUTH THREE TIMES DAILY AS NEEDED FOR COUGH   Biotin 5000 MCG TABS Take 10,000 mcg by mouth every evening.   buPROPion (WELLBUTRIN XL) 300 MG 24 hr tablet Take 300 mg by mouth daily.   Calcium Carbonate (CALCIUM 600 PO) Take by mouth daily.   chlorpheniramine (CHLOR-TRIMETON) 4 MG tablet Take 8 mg by mouth 2 (two) times a day.    Cholecalciferol (VITAMIN D-3) 125 MCG (5000 UT) TABS Take by mouth daily.   ciclopirox (PENLAC) 8 % solution Apply topically at bedtime. Apply over nail  and surrounding skin. Apply daily over previous coat. After seven (7) days, may remove with alcohol and continue cycle.   Cyanocobalamin (B-12 PO) Take 5,000 mcg by mouth daily.    denosumab (PROLIA) 60 MG/ML SOLN injection Inject 60 mg into the skin every 6 (six) months.    FLUoxetine (PROZAC) 20 MG capsule Take 20 mg by mouth daily.   losartan (COZAAR) 50 MG tablet Take 50 mg by mouth daily.    MAGNESIUM GLYCINATE PO Take by mouth.   methocarbamol (ROBAXIN) 500 MG tablet Take 500 mg by mouth every 8 (eight) hours as needed.   montelukast (SINGULAIR) 10 MG tablet TAKE 1 TABLET BY MOUTH EVERY DAY   Multiple Vitamins-Minerals (CENTRUM SILVER PO) Take 1 tablet by mouth daily.   pantoprazole (PROTONIX) 40 MG tablet TAKE 1 TABLET(40 MG) BY MOUTH  in the morning and and 1 tablet in the evening 30 TO 60 MINUTES BEFORE FIRST MEAL OF THE DAY.   Potassium 99 MG TABS Take by mouth.   rosuvastatin (CRESTOR) 10 MG tablet Take 10 mg by mouth every evening.    SYMBICORT 80-4.5 MCG/ACT  inhaler INHALE 2 PUFFS INTO THE LUNGS TWICE DAILY   Theanine 50 MG TBDP Take by mouth.   traMADol (ULTRAM) 50 MG tablet Take 50 mg by mouth 3 (three) times daily as needed.   Turmeric (QC TUMERIC COMPLEX PO) Take 1,400 mg by mouth daily.   valACYclovir (VALTREX) 1000 MG tablet Take 1,000 mg by mouth daily as needed.                 Objective:  Physical Exam  Wts  02/21/2022       188 03/22/2021     183  11/14/2019      184 03/25/2019     188  12/12/2018         188  05/28/2014      208 >  06/11/14  206 >   06/30/2014 207  > 10/18/2018  187           05/15/14 207 lb (93.895 kg)  04/29/13 193 lb (87.544 kg)  09/08/10 195 lb (88.451 kg)       Vital signs reviewed  02/21/2022  - Note at rest 02 sats  95% on RA   General appearance:    amb wf nad     HEENT : Oropharynx  clear     Nasal turbinates nl    NECK :  without  apparent JVD/ palpable Nodes/TM    LUNGS: no acc muscle use,  Nl contour chest which is clear to A and P bilaterally without cough on insp or exp maneuvers   CV:  RRR  no s3 or murmur or increase in P2, and no edema   ABD:  soft and nontender with nl inspiratory excursion in the supine position. No bruits or organomegaly appreciated   MS:  Nl gait/ ext warm with  R BKA prosthesis/  o/w no obvious joint restrictions  calf tenderness, cyanosis or clubbing    SKIN: warm and dry without lesions    NEURO:  alert, approp, nl sensorium with  no motor or cerebellar deficits apparent.          Assessment & Plan:

## 2022-02-21 ENCOUNTER — Ambulatory Visit (INDEPENDENT_AMBULATORY_CARE_PROVIDER_SITE_OTHER): Payer: Medicare Other | Admitting: Internal Medicine

## 2022-02-21 ENCOUNTER — Encounter: Payer: Self-pay | Admitting: Internal Medicine

## 2022-02-21 ENCOUNTER — Other Ambulatory Visit: Payer: Self-pay | Admitting: Internal Medicine

## 2022-02-21 DIAGNOSIS — K219 Gastro-esophageal reflux disease without esophagitis: Secondary | ICD-10-CM | POA: Diagnosis not present

## 2022-02-21 DIAGNOSIS — I2584 Coronary atherosclerosis due to calcified coronary lesion: Secondary | ICD-10-CM | POA: Diagnosis not present

## 2022-02-21 DIAGNOSIS — I2510 Atherosclerotic heart disease of native coronary artery without angina pectoris: Secondary | ICD-10-CM | POA: Diagnosis not present

## 2022-02-21 DIAGNOSIS — J45991 Cough variant asthma: Secondary | ICD-10-CM | POA: Diagnosis not present

## 2022-02-21 MED ORDER — SYMBICORT 80-4.5 MCG/ACT IN AERO: 2.0000 | INHALATION_SPRAY | Freq: Two times a day (BID) | RESPIRATORY_TRACT | 0 refills | Status: DC

## 2022-02-21 NOTE — Patient Instructions (Addendum)
Pantoprazole 40 mg  Take 30- 60 min before your first and last meals of the day   I will see if we can get you 3 months of Symbicort 80   Strongly consider 6-8 in bed blocks   .Please schedule a follow up visit in 12 months but call sooner if needed

## 2022-02-22 ENCOUNTER — Encounter: Payer: Self-pay | Admitting: Internal Medicine

## 2022-02-22 DIAGNOSIS — K219 Gastro-esophageal reflux disease without esophagitis: Secondary | ICD-10-CM | POA: Insufficient documentation

## 2022-02-22 NOTE — Assessment & Plan Note (Addendum)
Assoc with upper airway cough/ asthma  - 02/21/2022 rec ppi bid ac and bed blocks/ approp diet           Each maintenance medication was reviewed in detail including emphasizing most importantly the difference between maintenance and prns and under what circumstances the prns are to be triggered using an action plan format where appropriate.  Total time for H and P, chart review, counseling, reviewing hfa device(s) and generating customized AVS unique to this office visit / same day charting > 30 min with pt working on access to care issues and availability of affordable medications

## 2022-02-22 NOTE — Assessment & Plan Note (Addendum)
Onset Aug 2015 sporadic, severe, not seasonal/ and throat clearing ever since  - allergy profile 05/28/2014 > No eos, IgE 424 POS RAST only dust and mold  -  Sinus/chest  CT 06/03/2014 > wnl  - Trial of singulair 06/30/2014 >>>  ? Benefit  - 2019 proved intolerant of ACEi  - 10/18/2018  After extensive coaching inhaler device,  effectiveness =  75%> try dulera 100 2bid and 1st gen H1 blockers per guidelines    no mints > 100% resolution s gerd rx needed  - 05/10/2019 recurrence on symb 80/off ppi p [place] [employer] > restart with just tessalon prn  - recurred late may 2020 while maint on  pantoprarzole / h1 bid and singulair - 01/20/20 started back on symb 80 2bid as maint x one month  > only took a week and "all better"  - 03/05/2020 reported cough /throat clearing stopped after one week of symbicort (but also on tramadol), continued ppi bid and remained cough free so rec if cough recurs take symb 80 2bid s tramadol and continue ppi bid/ diet x 3 months then ov    As long as reflux well controlled, All goals of chronic asthma control met including optimal function and elimination of symptoms with minimal need for rescue therapy.  Contingencies discussed in full including contacting this office immediately if not controlling the symptoms using the rule of two's.     Using symbicort 80 prn Based on two studies from [employer]; 20 p 1865 (2018) and 380 : p2020-30 (2019) in pts with mild asthma it is reasonable to use low dose symbicort eg 80 2bid "prn" flare in this setting but I emphasized this was only shown with symbicort and takes advantage of the rapid onset of action but is not the same as "rescue therapy" but can be stopped once the acute symptoms have resolved and the need for rescue has been minimized (< 2 x weekly)    Advised:  formulary restrictions will be an ongoing challenge for the forseable future and I would be happy to pick an alternative if the pt will first  provide me a list of them -  pt   will need to return here for training for any new device that is required eg dpi vs hfa vs respimat.    In the meantime we can always provide samples so that the patient never runs out of any needed respiratory medications.  In her case also applying to [facility] for samples while still available as symbicort is now generic  F/u can be q 6 m sooner prn

## 2022-03-10 DIAGNOSIS — M810 Age-related osteoporosis without current pathological fracture: Secondary | ICD-10-CM | POA: Diagnosis not present

## 2022-03-10 DIAGNOSIS — F325 Major depressive disorder, single episode, in full remission: Secondary | ICD-10-CM | POA: Diagnosis not present

## 2022-03-10 DIAGNOSIS — B001 Herpesviral vesicular dermatitis: Secondary | ICD-10-CM | POA: Diagnosis not present

## 2022-03-10 DIAGNOSIS — N1831 Chronic kidney disease, stage 3a: Secondary | ICD-10-CM | POA: Diagnosis not present

## 2022-03-10 DIAGNOSIS — Z23 Encounter for immunization: Secondary | ICD-10-CM | POA: Diagnosis not present

## 2022-03-10 DIAGNOSIS — G3184 Mild cognitive impairment, so stated: Secondary | ICD-10-CM | POA: Diagnosis not present

## 2022-03-10 DIAGNOSIS — K219 Gastro-esophageal reflux disease without esophagitis: Secondary | ICD-10-CM | POA: Diagnosis not present

## 2022-03-10 DIAGNOSIS — I10 Essential (primary) hypertension: Secondary | ICD-10-CM | POA: Diagnosis not present

## 2022-03-10 DIAGNOSIS — F32A Depression, unspecified: Secondary | ICD-10-CM | POA: Diagnosis not present

## 2022-03-10 DIAGNOSIS — Z0000 Encounter for general adult medical examination without abnormal findings: Secondary | ICD-10-CM | POA: Diagnosis not present

## 2022-03-10 DIAGNOSIS — E785 Hyperlipidemia, unspecified: Secondary | ICD-10-CM | POA: Diagnosis not present

## 2022-03-10 DIAGNOSIS — J45991 Cough variant asthma: Secondary | ICD-10-CM | POA: Diagnosis not present

## 2022-03-10 DIAGNOSIS — I700 Atherosclerosis of aorta: Secondary | ICD-10-CM | POA: Diagnosis not present

## 2022-03-10 DIAGNOSIS — F411 Generalized anxiety disorder: Secondary | ICD-10-CM | POA: Diagnosis not present

## 2022-03-14 ENCOUNTER — Other Ambulatory Visit: Payer: Self-pay | Admitting: Family Medicine

## 2022-03-14 DIAGNOSIS — Z1231 Encounter for screening mammogram for malignant neoplasm of breast: Secondary | ICD-10-CM

## 2022-03-17 ENCOUNTER — Other Ambulatory Visit: Payer: Self-pay | Admitting: Family Medicine

## 2022-03-17 DIAGNOSIS — M810 Age-related osteoporosis without current pathological fracture: Secondary | ICD-10-CM

## 2022-03-24 DIAGNOSIS — F411 Generalized anxiety disorder: Secondary | ICD-10-CM | POA: Diagnosis not present

## 2022-03-24 DIAGNOSIS — F32A Depression, unspecified: Secondary | ICD-10-CM | POA: Diagnosis not present

## 2022-03-24 DIAGNOSIS — G3184 Mild cognitive impairment, so stated: Secondary | ICD-10-CM | POA: Diagnosis not present

## 2022-04-04 ENCOUNTER — Telehealth: Payer: Self-pay | Admitting: Internal Medicine

## 2022-04-04 MED ORDER — PREDNISONE 10 MG PO TABS: ORAL_TABLET | ORAL | 0 refills | Status: DC

## 2022-04-04 MED ORDER — TRAMADOL HCL 50 MG PO TABS: 50.0000 mg | ORAL_TABLET | ORAL | 0 refills | Status: AC | PRN

## 2022-04-04 NOTE — Telephone Encounter (Signed)
I called and spoke with the pt and notified of response per Dr Melvyn Novas She verbalized understanding  She asked that I send the instructions to her mychart  I have done this  Nothing further needed Appt with MW 04/25/22

## 2022-04-04 NOTE — Telephone Encounter (Signed)
Called pt but unable to reach. Left message for her to return call. 

## 2022-04-04 NOTE — Telephone Encounter (Signed)
Take delsym two tsp every 12 hours and supplement if needed with  tramadol 50 mg up to 2 every 4 hours to suppress the urge to cough. Swallowing water and/or using ice chips/non mint and menthol containing candies (such as lifesavers or sugarless jolly ranchers) are also effective.  You should rest your voice and avoid activities that you know make you cough.  Once you have eliminated the cough for 3 straight days try reducing the tramadol first,  then the delsym as tolerated.    I did tramadol  50 x 30   Also did Prednisone 10 mg take  4 each am x 2 days,   2 each am x 2 days,  1 each am x 2 days and stop   Set p f/u ov in 2 weeks

## 2022-04-04 NOTE — Telephone Encounter (Signed)
Spoke with pt who states for the last week she has a severe cough that is at times productive and at other times not. Pt says she as completed all her steps in her plan as cough is not responding. Pt denies fever/ chills/ GI upset/ SOB. Pt states she does have wheezing especially at night. Pt states compliance with all medications including Tessalon pears 3x a day. Pt has used Dlysum OTC but it did not provide much help. Dr. Melvyn Novas please advise. Pt offer OV but can't come in Tuesday or Wednesday and no other OV's available this week.

## 2022-04-07 ENCOUNTER — Other Ambulatory Visit: Payer: Self-pay | Admitting: *Deleted

## 2022-04-07 DIAGNOSIS — F411 Generalized anxiety disorder: Secondary | ICD-10-CM | POA: Diagnosis not present

## 2022-04-07 DIAGNOSIS — F32A Depression, unspecified: Secondary | ICD-10-CM | POA: Diagnosis not present

## 2022-04-07 DIAGNOSIS — G3184 Mild cognitive impairment, so stated: Secondary | ICD-10-CM | POA: Diagnosis not present

## 2022-04-07 MED ORDER — BENZONATATE 200 MG PO CAPS
ORAL_CAPSULE | ORAL | 2 refills | Status: DC
Start: 2022-04-07 — End: 2022-08-22

## 2022-04-08 ENCOUNTER — Telehealth: Payer: Self-pay | Admitting: Internal Medicine

## 2022-04-08 MED ORDER — CEFDINIR 300 MG PO CAPS: 300.0000 mg | ORAL_CAPSULE | Freq: Two times a day (BID) | ORAL | 0 refills | Status: DC

## 2022-04-08 NOTE — Telephone Encounter (Signed)
Omnicef 300 mg bid x 7 d and keep appt to see  [name] as planned

## 2022-04-08 NOTE — Telephone Encounter (Signed)
Spoke with pt and reviewed Dr. Gustavus Bryant advise along with medication instruction. Shelly [name] was called into verified pharmacy. Pt stated understanding. Nothing further needed at this time.

## 2022-04-08 NOTE — Telephone Encounter (Signed)
Spoke with pt who states her productive cough has not improved despite taking Prednisone, Delsym and tramadol. Pt currently has 2 days left ( 10 mg daily) left on Prednisone taper. Pt denies fever/ chills/ GI upset. Pt does state wheezing during her coughing spells. Pt states she is coughing up thick green sputum. Pt has been scheduled for Acute OV with Margrett Rud on 04/11/22. Dr. Melvyn Novas please advise.

## 2022-04-11 ENCOUNTER — Encounter: Payer: Self-pay | Admitting: Nurse Practitioner

## 2022-04-11 ENCOUNTER — Ambulatory Visit (INDEPENDENT_AMBULATORY_CARE_PROVIDER_SITE_OTHER): Payer: Medicare Other | Admitting: Nurse Practitioner

## 2022-04-11 VITALS — BP 102/64 | HR 83 | Ht 65.0 in | Wt 181.0 lb

## 2022-04-11 DIAGNOSIS — F411 Generalized anxiety disorder: Secondary | ICD-10-CM | POA: Diagnosis not present

## 2022-04-11 DIAGNOSIS — J209 Acute bronchitis, unspecified: Secondary | ICD-10-CM | POA: Diagnosis not present

## 2022-04-11 DIAGNOSIS — F32A Depression, unspecified: Secondary | ICD-10-CM | POA: Diagnosis not present

## 2022-04-11 DIAGNOSIS — J45991 Cough variant asthma: Secondary | ICD-10-CM

## 2022-04-11 DIAGNOSIS — G3184 Mild cognitive impairment, so stated: Secondary | ICD-10-CM | POA: Diagnosis not present

## 2022-04-11 DIAGNOSIS — R059 Cough, unspecified: Secondary | ICD-10-CM

## 2022-04-11 LAB — POCT EXHALED NITRIC OXIDE: FeNO level (ppb): 17

## 2022-04-11 NOTE — Assessment & Plan Note (Signed)
Bronchitic type illness. She did test for COVID at home, which was negative. She was treated with prednisone and empiric cefdinir course. Clinically improving today. Advised her to complete abx and continue cough control measures. We will obtain imaging if her symptoms don't improve.  Patient Instructions  Continue Symbicort 2 puffs Twice daily. Brush tongue and rinse mouth afterwards Continue benzonatate 1 capsule Three times a day as needed for cough Continue chlorpheniramine 8 mg Twice daily as needed for cough Continue singulair 1 tab At bedtime  Continue pantoprazole 1 tab daily Continue delsym 2 tsp Twice daily for cough  Try to decrease your tramadol back to your normal dosing  Complete cefdinir as previously prescribed   Follow up in 6 weeks with Dr. Melvyn Novas. If symptoms do not improve or worsen, please contact office for sooner follow up or seek emergency care.

## 2022-04-11 NOTE — Assessment & Plan Note (Signed)
Possible recent exacerbation with bronchitic like illness. FeNO is nl today. Continue ICS/LABA and singulair for trigger prevention. See above.

## 2022-04-11 NOTE — Progress Notes (Signed)
$'@Patient'T$  ID: Shelly [name], female    DOB: 01-10-1951, 71 y.o.   MRN: [id number]  Chief Complaint  Patient presents with   Follow-up    Pt acute visit symptoms began 2 weeks ago (cough, nasal congestion, green phlegm). She reports that the omnicef is helping and will be finished w/ course Friday. Pt reports her at home COVID test was negative.     Referring provider: Carol Ada, MD  HPI: 71 year old female, former smoker followed for cough variant asthma. She is a patient of Dr. Gustavus Bryant and last seen in office 02/21/2022. Past medical history significant for HTN, GERD, fibromyalgia.   TEST/EVENTS:  05/2014 allergy profile > no eos, IgE 424, positive RAST to dust and mold 05/2014 CT sinus chest > sinuses nl; calcified granuloma in the LLL 01/2019 CTA chest:  no evidence of PE. No LAD. Calcified mass in the LLL, likely benign and stable since 2015  02/21/2022: OV with Dr. Melvyn Novas. Maintained on Symbicort. No SABA use. Continue PPI. Advised increasing HOB.  04/11/2022: Today - follow up Patient presents today for follow up. She called the office 10/30 with reports of increased cough; she tried treating it with over the counter regimen and inhaler but no improvement. She was treated with prednisone taper and tramadol/delsym combo for cough suppression. She then called back this past Friday, 11/3, and wasn't feeling much better. Reported green phlegm and chest congestion as well. She was treated with cefdinir 7 day course and scheduled acute OV. Today, she tells me she is feeling much better. Cough is not gone but is improving. Sputum has also cleared up. She feels like her breathing is getting back to her normal. She was having some nasal symptoms, which have improved as well. She denies any fevers, chills, hemoptysis, leg swelling, orthopnea. She continues on Symbicort, uses it daily. She is taking benzonatate, delsym, chlorpheniramine for cough. She also increased her tramadol to four times a  day from twice a day, which is her usual dose.   No Known Allergies  Immunization History  Administered Date(s) Administered   Influenza Split 02/06/2011, 02/24/2011, 03/05/2013, 04/06/2014   Influenza, High Dose Seasonal PF 03/08/2021   Influenza,inj,Quad PF,6+ Mos 04/24/2015   Influenza-Unspecified 02/22/2011, 02/19/2012, 05/13/2016, 05/02/2017, 05/22/2018, 03/11/2019, 03/03/2020, 03/04/2021   PFIZER(Purple Top)SARS-COV-2 Vaccination 06/23/2019, 07/13/2019, 03/08/2020   PNEUMOCOCCAL CONJUGATE-20 09/24/2021   Pneumococcal Conjugate-13 11/15/2013   Pneumococcal Polysaccharide-23 12/25/2014   Tdap 04/11/2011, 09/24/2021   Zoster Recombinat (Shingrix) 09/28/2017, 02/01/2018   Zoster, Live 05/07/2012, 09/28/2017, 02/01/2018    Past Medical History:  Diagnosis Date   Anxiety    Arthritis    Asthma    Avascular necrosis of talus ([place])    Cancer ([place])    vulva pre cancer had surgery   Depression    Gait abnormality 03/20/2017   GERD (gastroesophageal reflux disease)    Hearing loss    "very minor"   History of pneumonia    Hyperlipidemia    Hypertension    Leg edema    left   Memory difficulty 03/20/2017   PONV (postoperative nausea and vomiting)    Stress incontinence     Tobacco History: Social History   Tobacco Use  Smoking Status Former   Packs/day: 1.00   Years: 40.00   Total pack years: 40.00   Types: Cigarettes   Quit date: 02/05/2007   Years since quitting: 15.1  Smokeless Tobacco Never   Counseling given: Not Answered   Outpatient Medications Prior to Visit  Medication Sig Dispense Refill   Acetylcarnitine HCl (ACETYL L-CARNITINE) 500 MG CAPS Take by mouth daily.     acidophilus (RISAQUAD) CAPS capsule Take 1 capsule by mouth daily.     aspirin EC 81 MG tablet TAKE 1 TABLET(81 MG) BY MOUTH DAILY 90 tablet 3   B Complex-Folic Acid (B COMPLEX PLUS PO) 1 tablet     benzonatate (TESSALON) 200 MG capsule TAKE 1 CAPSULE(200 MG) BY MOUTH THREE TIMES DAILY AS  NEEDED FOR COUGH 90 capsule 2   Biotin 5000 MCG TABS Take 10,000 mcg by mouth every evening.     buPROPion (WELLBUTRIN XL) 300 MG 24 hr tablet Take 300 mg by mouth daily.     Calcium Carbonate (CALCIUM 600 PO) Take by mouth daily.     cefdinir (OMNICEF) 300 MG capsule Take 1 capsule (300 mg total) by mouth 2 (two) times daily. 14 capsule 0   chlorpheniramine (CHLOR-TRIMETON) 4 MG tablet Take 8 mg by mouth 2 (two) times a day.      Cholecalciferol (VITAMIN D-3) 125 MCG (5000 UT) TABS Take by mouth daily.     ciclopirox (PENLAC) 8 % solution Apply topically at bedtime. Apply over nail and surrounding skin. Apply daily over previous coat. After seven (7) days, may remove with alcohol and continue cycle. 6.6 mL 0   Cyanocobalamin (B-12 PO) Take 5,000 mcg by mouth daily.      denosumab (PROLIA) 60 MG/ML SOLN injection Inject 60 mg into the skin every 6 (six) months.      FLUoxetine (PROZAC) 20 MG capsule Take 20 mg by mouth daily.     losartan (COZAAR) 50 MG tablet Take 50 mg by mouth daily.      MAGNESIUM GLYCINATE PO Take by mouth.     methocarbamol (ROBAXIN) 500 MG tablet Take 500 mg by mouth every 8 (eight) hours as needed.     montelukast (SINGULAIR) 10 MG tablet TAKE 1 TABLET BY MOUTH EVERY DAY 90 tablet 1   Multiple Vitamins-Minerals (CENTRUM SILVER PO) Take 1 tablet by mouth daily.     pantoprazole (PROTONIX) 40 MG tablet TAKE 1 TABLET(40 MG) BY MOUTH  in the morning and and 1 tablet in the evening 30 TO 60 MINUTES BEFORE FIRST MEAL OF THE DAY. 180 tablet 1   Potassium 99 MG TABS Take by mouth.     predniSONE (DELTASONE) 10 MG tablet Take  4 each am x 2 days,   2 each am x 2 days,  1 each am x 2 days and stop 14 tablet 0   rosuvastatin (CRESTOR) 10 MG tablet Take 10 mg by mouth every evening.   1   SYMBICORT 80-4.5 MCG/ACT inhaler Inhale 2 puffs into the lungs 2 (two) times daily. 30.6 g 0   Theanine 50 MG TBDP Take by mouth.     traMADol (ULTRAM) 50 MG tablet Take 50 mg by mouth 3 (three)  times daily as needed.     Turmeric (QC TUMERIC COMPLEX PO) Take 1,400 mg by mouth daily.     valACYclovir (VALTREX) 1000 MG tablet Take 1,000 mg by mouth daily as needed.      No facility-administered medications prior to visit.     Review of Systems:   Constitutional: No weight loss or gain, night sweats, fevers, chills, fatigue, or lassitude. HEENT: No headaches, difficulty swallowing, tooth/dental problems, or sore throat. No sneezing, itching, ear ache. +nasal congestion, post nasal drip CV:  No chest pain, orthopnea, PND, swelling in lower extremities,  anasarca, dizziness, palpitations, syncope Resp: +shortness of breath with exertion; minimally productive cough; rare wheeze. No excess mucus or change in color of mucus. No productive or non-productive. No hemoptysis. No chest wall deformity GI:  No heartburn, indigestion, abdominal pain, nausea, vomiting, diarrhea, change in bowel habits, loss of appetite, bloody stools.  Skin: No rash, lesions, ulcerations MSK:  No joint pain or swelling.  No decreased range of motion.  No back pain. Neuro: No dizziness or lightheadedness.  Psych: No depression or anxiety. Mood stable.     Physical Exam:  BP 102/64   Pulse 83   Ht '5\' 5"'$  (1.651 m)   Wt 181 lb (82.1 kg)   LMP  (LMP Unknown)   SpO2 97%   BMI 30.12 kg/m   GEN: Pleasant, interactive, well-appearing; obese; in no acute distress HEENT:  Normocephalic and atraumatic. PERRLA. Sclera white. Nasal turbinates pink, moist and patent bilaterally. No rhinorrhea present. Oropharynx erythematous and moist, without exudate or edema. No lesions, ulcerations NECK:  Supple w/ fair ROM. No JVD present. Normal carotid impulses w/o bruits. Thyroid symmetrical with no goiter or nodules palpated. No lymphadenopathy.   CV: RRR, no m/r/g, no peripheral edema. Pulses intact, +2 bilaterally. No cyanosis, pallor or clubbing. PULMONARY:  Unlabored, regular breathing. Clear bilaterally A&P w/o  wheezes/rales/rhonchi. No accessory muscle use.  GI: BS present and normoactive. Soft, non-tender to palpation. No organomegaly or masses detected.  MSK: No erythema, warmth or tenderness. Cap refil <2 sec all extrem. No deformities or joint swelling noted.  Neuro: A/Ox3. No focal deficits noted.   Skin: Warm, no lesions or rashe Psych: Normal affect and behavior. Judgement and thought content appropriate.     Lab Results:  CBC    Component Value Date/Time   WBC 10.3 01/11/2019 1432   RBC 3.47 (L) 01/11/2019 1432   HGB 10.4 (L) 01/11/2019 1432   HCT 34.0 (L) 01/11/2019 1432   PLT 309 01/11/2019 1432   MCV 98.0 01/11/2019 1432   MCH 30.0 01/11/2019 1432   MCHC 30.6 01/11/2019 1432   RDW 12.8 01/11/2019 1432   LYMPHSABS 2.1 01/11/2019 1432   MONOABS 1.0 01/11/2019 1432   EOSABS 0.0 01/11/2019 1432   BASOSABS 0.0 01/11/2019 1432    BMET    Component Value Date/Time   NA 136 01/11/2019 1432   NA 142 09/18/2017 1244   K 3.8 01/11/2019 1432   CL 98 01/11/2019 1432   CO2 28 01/11/2019 1432   GLUCOSE 99 01/11/2019 1432   BUN 15 01/11/2019 1432   BUN 15 09/18/2017 1244   CREATININE 0.95 01/11/2019 1432   CREATININE 0.83 02/12/2015 0847   CALCIUM 8.7 (L) 01/11/2019 1432   GFRNONAA >60 01/11/2019 1432   GFRAA >60 01/11/2019 1432    BNP No results found for: "BNP"   Imaging:  No results found.        No data to display          No results found for: "NITRICOXIDE"      Assessment & Plan:   Acute bronchitis Bronchitic type illness. She did test for COVID at home, which was negative. She was treated with prednisone and empiric cefdinir course. Clinically improving today. Advised her to complete abx and continue cough control measures. We will obtain imaging if her symptoms don't improve.  Patient Instructions  Continue Symbicort 2 puffs Twice daily. Brush tongue and rinse mouth afterwards Continue benzonatate 1 capsule Three times a day as needed for  cough Continue chlorpheniramine 8  mg Twice daily as needed for cough Continue singulair 1 tab At bedtime  Continue pantoprazole 1 tab daily Continue delsym 2 tsp Twice daily for cough  Try to decrease your tramadol back to your normal dosing  Complete cefdinir as previously prescribed   Follow up in 6 weeks with Dr. Melvyn Novas. If symptoms do not improve or worsen, please contact office for sooner follow up or seek emergency care.    Cough variant asthma ? assoc with [facility] Possible recent exacerbation with bronchitic like illness. FeNO is nl today. Continue ICS/LABA and singulair for trigger prevention. See above.    I spent 28 minutes of dedicated to the care of this patient on the date of this encounter to include pre-visit review of records, face-to-face time with the patient discussing conditions above, post visit ordering of testing, clinical documentation with the electronic health record, making appropriate referrals as documented, and communicating necessary findings to members of the patients care team.  Clayton Bibles, NP 04/11/2022  Pt aware and understands NP's role.

## 2022-04-11 NOTE — Patient Instructions (Addendum)
Continue Symbicort 2 puffs Twice daily. Brush tongue and rinse mouth afterwards Continue benzonatate 1 capsule Three times a day as needed for cough Continue chlorpheniramine 8 mg Twice daily as needed for cough Continue singulair 1 tab At bedtime  Continue pantoprazole 1 tab daily Continue delsym 2 tsp Twice daily for cough  Try to decrease your tramadol back to your normal dosing  Complete cefdinir as previously prescribed   Follow up in 6 weeks with Dr. Melvyn Novas. If symptoms do not improve or worsen, please contact office for sooner follow up or seek emergency care.

## 2022-04-14 ENCOUNTER — Ambulatory Visit
Admission: RE | Admit: 2022-04-14 | Discharge: 2022-04-14 | Disposition: A | Discharge status: Home / Self Care | Payer: Medicare Other | Source: Ambulatory Visit | Attending: Family Medicine | Admitting: Family Medicine

## 2022-04-14 DIAGNOSIS — Z1231 Encounter for screening mammogram for malignant neoplasm of breast: Secondary | ICD-10-CM

## 2022-04-15 ENCOUNTER — Ambulatory Visit (HOSPITAL_COMMUNITY)
Admission: RE | Admit: 2022-04-15 | Discharge: 2022-04-15 | Disposition: A | Discharge status: Home / Self Care | Payer: Medicare Other | Source: Ambulatory Visit | Attending: Interventional Cardiology | Admitting: Interventional Cardiology

## 2022-04-15 DIAGNOSIS — I779 Disorder of arteries and arterioles, unspecified: Secondary | ICD-10-CM

## 2022-04-15 DIAGNOSIS — I771 Stricture of artery: Secondary | ICD-10-CM

## 2022-04-18 ENCOUNTER — Telehealth: Payer: Self-pay | Admitting: Interventional Cardiology

## 2022-04-18 ENCOUNTER — Other Ambulatory Visit: Payer: Self-pay | Admitting: Family Medicine

## 2022-04-18 DIAGNOSIS — M5416 Radiculopathy, lumbar region: Secondary | ICD-10-CM | POA: Diagnosis not present

## 2022-04-18 DIAGNOSIS — I779 Disorder of arteries and arterioles, unspecified: Secondary | ICD-10-CM

## 2022-04-18 DIAGNOSIS — F32A Depression, unspecified: Secondary | ICD-10-CM | POA: Diagnosis not present

## 2022-04-18 DIAGNOSIS — F419 Anxiety disorder, unspecified: Secondary | ICD-10-CM | POA: Diagnosis not present

## 2022-04-18 DIAGNOSIS — R928 Other abnormal and inconclusive findings on diagnostic imaging of breast: Secondary | ICD-10-CM

## 2022-04-18 NOTE — Telephone Encounter (Signed)
?  Pt is returning call to get carotid result ?

## 2022-04-18 NOTE — Telephone Encounter (Signed)
-----   Message from Jettie Booze, MD sent at 04/18/2022 12:27 PM EST ----- Moderate carotid disease.  No change.  Repeat study in 12 months.

## 2022-04-18 NOTE — Telephone Encounter (Signed)
I spoke with patient and reviewed results with her.

## 2022-04-21 ENCOUNTER — Encounter: Payer: Self-pay | Admitting: Podiatry

## 2022-04-21 ENCOUNTER — Ambulatory Visit (INDEPENDENT_AMBULATORY_CARE_PROVIDER_SITE_OTHER): Payer: Medicare Other | Admitting: Podiatry

## 2022-04-21 DIAGNOSIS — B351 Tinea unguium: Secondary | ICD-10-CM | POA: Diagnosis not present

## 2022-04-21 DIAGNOSIS — F32A Depression, unspecified: Secondary | ICD-10-CM | POA: Diagnosis not present

## 2022-04-21 DIAGNOSIS — M79675 Pain in left toe(s): Secondary | ICD-10-CM

## 2022-04-21 DIAGNOSIS — M79674 Pain in right toe(s): Secondary | ICD-10-CM

## 2022-04-21 DIAGNOSIS — L603 Nail dystrophy: Secondary | ICD-10-CM

## 2022-04-21 DIAGNOSIS — F419 Anxiety disorder, unspecified: Secondary | ICD-10-CM | POA: Diagnosis not present

## 2022-04-21 MED ORDER — NEOMYCIN-POLYMYXIN-HC 3.5-10000-1 OT SUSP: OTIC | 0 refills | Status: DC

## 2022-04-21 NOTE — Progress Notes (Signed)
  Subjective:  Patient ID: Shelly [name], female    DOB: Apr 26, 1951,  MRN: [id number]  Chief Complaint  Patient presents with   Nail Problem    Thick painful toenails, 3 month follow up    71 y.o. female presents with the above complaint. History confirmed with patient.  She has been using the ciclopirox.  The nails continue to give her quite a bit of trouble and bothersome.  The left great toenail especially is causing pain in shoe gear.  Objective:  Physical Exam: warm, good capillary refill, no trophic changes or ulcerative lesions, normal DP and PT pulses, and normal sensory exam. Left Foot: Thickened elongated toenails with subungual debris and discoloration, significantly dystrophic thickened great toenail   Radiographs: Multiple views x-ray of the left foot: No osseous abnormalities in the area of concern there is prominence of the cuboid and fifth met base     Assessment:   1. Pain due to onychomycosis of toenails of both feet   2. Nail dystrophy       Plan:  Patient was evaluated and treated and all questions answered.  She will continue the [place] for the lesser toenails.  Recommended debridement of the nails today. Sharp and mechanical debridement performed of all painful and mycotic nails today. Nails debrided in length and thickness using a nail nipper to level of comfort.  Regarding the left hallux nail, she has not been able to have her pain alleviated by regular debridement.  Discussed permanent total matricectomy with her which she requested.  We discussed the risk and benefits.  Following verbal consent the left hallux was anesthetized with 1.5 cc each of 2% lidocaine and 0.5% Marcaine plain.  It was prepped with Betadine and a tourniquet was applied.  The nail plate was avulsed and 3 applications of phenolic acid was applied to the nailbed.  It was irrigated with alcohol and a Silvadene and gauze dressing was applied.  Post care instructions were given  Cortisporin sent to pharmacy.    Return in about 3 months (around 07/22/2022) for painful thick nails.

## 2022-04-21 NOTE — Patient Instructions (Signed)

## 2022-04-25 ENCOUNTER — Ambulatory Visit: Payer: Medicare Other | Admitting: Internal Medicine

## 2022-04-25 DIAGNOSIS — F32A Depression, unspecified: Secondary | ICD-10-CM | POA: Diagnosis not present

## 2022-04-25 DIAGNOSIS — F419 Anxiety disorder, unspecified: Secondary | ICD-10-CM | POA: Diagnosis not present

## 2022-05-02 ENCOUNTER — Telehealth: Payer: Self-pay | Admitting: Internal Medicine

## 2022-05-02 MED ORDER — CEFDINIR 300 MG PO CAPS: 300.0000 mg | ORAL_CAPSULE | Freq: Two times a day (BID) | ORAL | 0 refills | Status: DC

## 2022-05-02 NOTE — Telephone Encounter (Signed)
Called patient. She verbalized understanding.   Nothing further needed.

## 2022-05-02 NOTE — Telephone Encounter (Signed)
Called and spoke with pt letting her know recs per MW and she verbalized understanding. Rx for cefdinir has been sent to preferred pharmacy. Nothing further needed.

## 2022-05-02 NOTE — Telephone Encounter (Signed)
Ok to refill x 10 days then ov in 2 weeks to regroup = bring all meds

## 2022-05-02 NOTE — Telephone Encounter (Signed)
Doubt infectious to others  at this point but would limit exposure as much as possible (face mask best she can do)

## 2022-05-02 NOTE — Telephone Encounter (Signed)
Dr. Melvyn Novas, can you please advise? She wants to know if she is considered to be contagious.

## 2022-05-02 NOTE — Telephone Encounter (Signed)
Called and spoke with patient. She stated that her cough has returned. She called on 10/30 for a productive cough and received a prednisone taper as well as cefdinir '300mg'$ . She felt better for a few days but the cough has returned. This time it returned on 11/23. She has a productive cough with phlegm that ranges in color from yellow to green. Denied any fevers, body aches or recent sick exposures. Confirmed that she is still using Symbicort and has been using Delsym without any relief.   She wanted to know if MW would be willing to send in cefdinir again for her as this worked well a few weeks ago.   She also wanted to know if she needs to consider herself contagious. 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   Pharmacy is [facility] on [address].   MW, can you please advise? Thanks!

## 2022-05-05 DIAGNOSIS — L570 Actinic keratosis: Secondary | ICD-10-CM | POA: Diagnosis not present

## 2022-05-06 ENCOUNTER — Other Ambulatory Visit: Payer: Self-pay | Admitting: Family Medicine

## 2022-05-06 ENCOUNTER — Ambulatory Visit
Admission: RE | Admit: 2022-05-06 | Discharge: 2022-05-06 | Disposition: A | Discharge status: Home / Self Care | Payer: Medicare Other | Source: Ambulatory Visit | Attending: Family Medicine | Admitting: Family Medicine

## 2022-05-06 DIAGNOSIS — N6322 Unspecified lump in the left breast, upper inner quadrant: Secondary | ICD-10-CM | POA: Diagnosis not present

## 2022-05-06 DIAGNOSIS — N6320 Unspecified lump in the left breast, unspecified quadrant: Secondary | ICD-10-CM

## 2022-05-06 DIAGNOSIS — R928 Other abnormal and inconclusive findings on diagnostic imaging of breast: Secondary | ICD-10-CM | POA: Diagnosis not present

## 2022-05-06 DIAGNOSIS — N6324 Unspecified lump in the left breast, lower inner quadrant: Secondary | ICD-10-CM | POA: Diagnosis not present

## 2022-05-09 DIAGNOSIS — F32A Depression, unspecified: Secondary | ICD-10-CM | POA: Diagnosis not present

## 2022-05-09 DIAGNOSIS — F419 Anxiety disorder, unspecified: Secondary | ICD-10-CM | POA: Diagnosis not present

## 2022-05-12 DIAGNOSIS — M25511 Pain in right shoulder: Secondary | ICD-10-CM | POA: Diagnosis not present

## 2022-05-12 DIAGNOSIS — M7541 Impingement syndrome of right shoulder: Secondary | ICD-10-CM | POA: Diagnosis not present

## 2022-05-16 ENCOUNTER — Telehealth: Payer: Self-pay | Admitting: *Deleted

## 2022-05-16 ENCOUNTER — Ambulatory Visit (INDEPENDENT_AMBULATORY_CARE_PROVIDER_SITE_OTHER): Payer: Medicare Other | Admitting: Internal Medicine

## 2022-05-16 ENCOUNTER — Encounter: Payer: Self-pay | Admitting: Internal Medicine

## 2022-05-16 VITALS — BP 128/76 | HR 87 | Temp 97.6°F | Ht 65.5 in | Wt 183.6 lb

## 2022-05-16 DIAGNOSIS — I2510 Atherosclerotic heart disease of native coronary artery without angina pectoris: Secondary | ICD-10-CM | POA: Diagnosis not present

## 2022-05-16 DIAGNOSIS — J45991 Cough variant asthma: Secondary | ICD-10-CM | POA: Diagnosis not present

## 2022-05-16 DIAGNOSIS — F32A Depression, unspecified: Secondary | ICD-10-CM | POA: Diagnosis not present

## 2022-05-16 DIAGNOSIS — F419 Anxiety disorder, unspecified: Secondary | ICD-10-CM | POA: Diagnosis not present

## 2022-05-16 DIAGNOSIS — I2584 Coronary atherosclerosis due to calcified coronary lesion: Secondary | ICD-10-CM | POA: Diagnosis not present

## 2022-05-16 MED ORDER — GABAPENTIN 100 MG PO CAPS: 100.0000 mg | ORAL_CAPSULE | Freq: Three times a day (TID) | ORAL | 3 refills | Status: DC

## 2022-05-16 MED ORDER — NEOMYCIN-POLYMYXIN-HC 3.5-10000-1 OT SUSP: OTIC | 0 refills | Status: DC

## 2022-05-16 NOTE — Telephone Encounter (Signed)
Patient is calling to ask how long to soak the nail , explained to patient that she is to continue to soak the nail until no more drainage ,is also requesting a refill of the neomycin-polymyxin-Hydrocortisone. Please advise.

## 2022-05-16 NOTE — Patient Instructions (Signed)
Pantoprazole 40 mg should be Take 30- 60 min before your first and last meals of the day   Gabapentin 100 mg three times a day   Continue chlorpheniramine as needed   Please schedule a follow up visit in 3 months but call sooner if needed

## 2022-05-16 NOTE — Assessment & Plan Note (Addendum)
Onset Aug 2015 sporadic, severe, not seasonal/ and throat clearing ever since  - allergy profile 05/28/2014 > No eos, IgE 424 POS RAST only dust and mold  -  Sinus/chest  CT 06/03/2014 > wnl  - Trial of singulair 06/30/2014 >>>  ? Benefit  - 2019 proved intolerant of ACEi  - 10/18/2018  After extensive coaching inhaler device,  effectiveness =  75%> try dulera 100 2bid and 1st gen H1 blockers per guidelines    no mints > 100% resolution s gerd rx needed  - 05/10/2019 recurrence on symb 80/off ppi p [place] [employer] > restart with just tessalon prn  - recurred late may 2020 while maint on  pantoprarzole / h1 bid and singulair - 01/20/20 started back on symb 80 2bid as maint x one month  > only took a week and "all better"  - 03/05/2020 reported cough /throat clearing stopped after one week of symbicort (but also on tramadol), continued ppi bid and remained cough free so rec if cough recurs take symb 80 2bid s tramadol and continue ppi bid/ diet x 3 months then ov  - 05/16/2022  gabapentin 100 mg tid   Pattern of the chronic cough x 2015 much more suggestive of Upper airway cough syndrome (previously labeled PNDS),  is so named because it's frequently impossible to sort out how much is  CR/sinusitis with freq throat clearing (which can be related to primary GERD)   vs  causing  secondary (" extra esophageal")  GERD from wide swings in gastric pressure that occur with throat clearing, often  promoting self use of mint and menthol lozenges that reduce the lower esophageal sphincter tone and exacerbate the problem further in a cyclical fashion.   These are the same pts (now being labeled as having "irritable larynx syndrome" by some cough centers) who not infrequently have a history of having failed to tolerate ace inhibitors,  dry powder inhalers or biphosphonates or report having atypical/extraesophageal reflux symptoms that don't respond to standard doses of PPI  and are easily confused as having aecopd or asthma  flares by even experienced allergists/ pulmonologists (myself included).   Rec Max gerd rx and continue to work on wt loss Continue 1st gen H1 blockers per guidelines  Add gabapentin trial starting at 100 mg tid to max of 300 mg qid if not able to eliminate the chronic component of the cough  Min rx with symb 80 prn   F/u q 3 m ,sooner prn          Each maintenance medication was reviewed in detail including emphasizing most importantly the difference between maintenance and prns and under what circumstances the prns are to be triggered using an action plan format where appropriate.  Total time for H and P, chart review, counseling, reviewing hfa device(s) and generating customized AVS unique to this office visit / same day charting  > 30 min for  refractory respiratory  symptoms of uncertain etiology

## 2022-05-16 NOTE — Telephone Encounter (Signed)
Spoke with patient to update that medication refill has been sent.

## 2022-05-16 NOTE — Progress Notes (Signed)
Subjective:    Patient ID: Shelly [name], female    DOB: 1951/05/03       MRN: [id number]    Brief patient profile:  69 yowf mother  of optimetrist  quit smoking 2008  watery eyes in her 91s year round ? Worse in winter eventually started on zyrtec 2013 and seemed to help s assoc cough/ wheeze then new onset cough Aug 2015 and proved refractory so referred to pulmonary clinic 05/15/14  by Dr Jeremy Johann    History of Present Illness  05/15/2014 1st [facility] office visit/ [name]   Chief Complaint  Patient presents with   Pulmonary Consult    Referred by Dr. Carol Ada. Pt c/o cough since August 2015- cough is non prod and somtimes seems worse at night. She also c/o DOE with walking up stairs.    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   Prednisone 10 mg take  4 each am x 2 days,   2 each am x 2 days,  1 each am x 2 days and stop (this is to eliminate allergies and inflammation from coughing) Omeprazole 40 mg  Take 30-60 min before first meal of the day and Pepcid 20 mg one bedtime plus chlorpheniramine 4 mg x 2 at bedtime  GERD diet reviewed     05/28/2014 f/u ov/[name] re: cough since aug 2015 Chief Complaint  Patient presents with   Acute Visit    Pt states that after last visit her cough had pretty much resolved, but then started back about 1 wk ago.  Cough is non prod and "comes and goes".   95% better, still urge to  clear throat daytime on above rx/ confused about when to d/c what  No noct cough  at all  Sob doe x steps better Then gradually worse x one week , again dry and variable   Kouffman Reflux v Neurogenic Cough Differentiator Reflux Comments  Do you awaken from a sound sleep coughing violently?                            With trouble breathing? Not now   Do you have choking episodes when you cannot  Get enough air, gasping for air ?              Not now   Do you usually cough when you lie down into  The bed, or when you just lie down to rest ?                          Not now    Do you usually cough after meals or eating?         No   Do you cough when (or after) you bend over?    Still some  GERD SCORE     Kouffman Reflux v Neurogenic Cough Differentiator Neurogenic   Do you more-or-less cough all day long? no   Does change of temperature make you cough? no   Does laughing or chuckling cause you to cough? Still some   Do fumes (perfume, automobile fumes, burned  Toast, etc.,) cause you to cough ?      no   Does speaking, singing, or talking on the phone cause you to cough   ?               Not now   Neurogenic/Airway score       rec Prednisone 10 mg take  4 each am x 2 days,   2 each am x 2 days,  1 each am x 2 days and stop  Take Tessalon 4x daily  and supplement if needed with  tramadol 50 mg up to 2 every 4 hours to suppress the urge to cough. Swallowing water or using ice chips/non mint and menthol containing candies (such as lifesavers or sugarless jolly ranchers) are also effective.  You should rest your voice and avoid activities that you know make you cough. Once you have eliminated the cough for 3 straight days try reducing the tramadol first,  then the tessalon as tolerated Please see patient coordinator before you leave today  to schedule sinus and chest CT and fluoro > neg       10/18/2018   Pulmonary/ re-estabish/Lakendrick Paradis re:  Tendency to cough sporadic pattern x years,  recurrent since March 2020 while on singulair  Chief Complaint  Patient presents with   Pulmonary Consult    Self referral.  Pt states has had increased cough x 2 months.  Cough is currently non prod. She has not had to use her proair in the past wk.     was maint on singulair,  h1 x 2 hs and no gerd rx then acutely worse x 2 months s obvious trigger Typically year round pattern is that every few weeks the cough starts back up  Dyspnea:  MMRC1 = even on best edays  can walk nl pace, flat grade, can't hurry or go uphills or steps s sob   Cough: mostly dry / gag ? Sometime vomit when cough is severe and "almost pass out"  Sleeping: bed is flat / cough was bad at night better now  SABA use: saba helped as does prednisone  02: 02  Using lots of mints  acei intol ? When  d/c'd (started summer 2019)  rec Plan A = Automatic =Dulera 100 Take 2 puffs first thing in am and then another 2 puffs about 12 hours later and continue singulair 10 mg each pm  Work on inhaler technique:   Plan B = Backup Only use your albuterol inhaler as a rescue medication  Whenever cough start  prilosec 20 mg Take 30- 60 min before your first and last meals of the day until no longer coughing and  For drainage / throat tickle try take CHLORPHENIRAMINE  4 mg  (Chlortab '4mg'$   at [employer] should be easiest to find in the green box)  take one every 4 hours as needed - available over the counter- may cause drowsiness so start with just a bedtime dose or two and see how you tolerate it before trying in daytime      02/21/2022  f/u ov/[name] re: uacs vs cough variant asthma  maint on symbiocrt 80  avg not even  q week/ seems to correlete with eating chilly / protonix 40 mg  not taking ac  Chief Complaint  Patient presents with   Follow-up    Some coughing at times   Dyspnea: MMRC1 = can walk nl pace, flat grade, can't hurry or go uphills or steps s sob   Cough: ok now, usually dry  Sleeping: no resp cc SABA use: none   02: none  Rec Pantoprazole 40 mg  Take 30- 60 min before your first and last meals of the day  I will see if we can get you 3 months of Symbicort 80  Strongly consider 6-8 in bed blocks     05/16/2022  f/u ov/Zienna Ahlin re: cough x 2015  maint on Protonix  / s/p "sick cough" rx with omnicef and resolved  Chief Complaint  Patient presents with   Follow-up    Cough much improved today.  Pt c/o coughing x 4 weeks  Has been coughing daily x 2015 / worse with laughter/ better with tessalon  Dyspnea:  Not limited by breathing from desired activities   Cough: minimal mucoid Sleeping: flat bed one pillow  SABA use: occ symbicort 80 02: none  Covid status:   ? How many vax , infected x one     No obvious day to day or daytime variability or assoc excess/ purulent sputum or mucus plugs or hemoptysis or cp or chest tightness, subjective wheeze or overt sinus or hb symptoms.   Sleeping now  without nocturnal  or early am exacerbation  of respiratory  c/o's or need for noct saba. Also denies any obvious fluctuation of symptoms with weather or environmental changes or other aggravating or alleviating factors except as outlined above   No unusual exposure hx or h/o childhood pna/ asthma or knowledge of premature birth.  Current Allergies, Complete Past Medical History, Past Surgical History, Family History, and Social History were reviewed in [employer] record.  ROS  The following are not active complaints unless bolded Hoarseness, sore throat, dysphagia, dental problems, itching, sneezing,  nasal congestion or discharge of excess mucus or purulent secretions, ear ache,   fever, chills, sweats, unintended wt loss or wt gain, classically pleuritic or exertional cp,  orthopnea pnd or arm/hand swelling  or leg swelling, presyncope, palpitations, abdominal pain, anorexia, nausea, vomiting, diarrhea  or change in bowel habits or change in bladder habits, change in stools or change in  urine, dysuria, hematuria,  rash, arthralgias, visual complaints, headache, numbness, weakness or ataxia or problems with walking or coordination,  change in mood or  memory.        Current Meds  Medication Sig   Acetylcarnitine HCl (ACETYL L-CARNITINE) 500 MG CAPS Take by mouth daily.   acidophilus (RISAQUAD) CAPS capsule Take 1 capsule by mouth daily.   aspirin EC 81 MG tablet TAKE 1 TABLET(81 MG) BY MOUTH DAILY   B Complex-Folic Acid (B COMPLEX PLUS PO) 1 tablet   benzonatate (TESSALON) 200 MG capsule TAKE 1 CAPSULE(200 MG) BY MOUTH THREE TIMES DAILY AS NEEDED FOR COUGH   Biotin 5000 MCG TABS Take 10,000 mcg by mouth every evening.   buPROPion (WELLBUTRIN XL) 300 MG 24 hr tablet Take 300 mg by mouth daily.   Calcium Carbonate (CALCIUM 600 PO) Take by mouth daily.   chlorpheniramine (CHLOR-TRIMETON) 4 MG tablet Take 8 mg by mouth 2 (two) times a day.    Cholecalciferol (VITAMIN D-3) 125 MCG (5000 UT) TABS Take by mouth daily.  ciclopirox (PENLAC) 8 % solution Apply topically at bedtime. Apply over nail and surrounding skin. Apply daily over previous coat. After seven (7) days, may remove with alcohol and continue cycle.   Cyanocobalamin (B-12 PO) Take 5,000 mcg by mouth daily.    denosumab (PROLIA) 60 MG/ML SOLN injection Inject 60 mg into the skin every 6 (six) months.    FLUoxetine (PROZAC) 20 MG capsule Take 20 mg by mouth daily.   losartan (COZAAR) 50 MG tablet Take 50 mg by mouth daily.    MAGNESIUM GLYCINATE PO Take by mouth.   methocarbamol (ROBAXIN) 500 MG tablet Take 500 mg by mouth every 8 (eight) hours as needed.   montelukast (SINGULAIR) 10 MG tablet TAKE 1 TABLET BY MOUTH EVERY DAY   Multiple Vitamins-Minerals (CENTRUM SILVER PO) Take 1 tablet by mouth daily.   neomycin-polymyxin-hydrocortisone (CORTISPORIN) 3.5-10000-1 OTIC suspension Apply 1-2 drops daily after soaking and cover with bandaid   pantoprazole (PROTONIX) 40 MG tablet TAKE 1 TABLET(40 MG) BY MOUTH  in the  morning and and 1 tablet in the evening 30 TO 60 MINUTES BEFORE FIRST MEAL OF THE DAY.   Potassium 99 MG TABS Take by mouth.   rosuvastatin (CRESTOR) 10 MG tablet Take 10 mg by mouth every evening.    SYMBICORT 80-4.5 MCG/ACT inhaler Inhale 2 puffs into the lungs 2 (two) times daily.   Theanine 50 MG TBDP Take by mouth.   traMADol (ULTRAM) 50 MG tablet Take 50 mg by mouth 3 (three) times daily as needed.   Turmeric (QC TUMERIC COMPLEX PO) Take 1,400 mg by mouth daily.   valACYclovir (VALTREX) 1000 MG tablet Take 1,000 mg by mouth daily as needed.                   Objective:  Physical Exam  Wts  05/16/2022     183  02/21/2022       188 03/22/2021     183  11/14/2019      184 03/25/2019     188  12/12/2018         188  05/28/2014      208 >  06/11/14  206 >   06/30/2014 207  > 10/18/2018  187           05/15/14 207 lb (93.895 kg)  04/29/13 193 lb (87.544 kg)  09/08/10 195 lb (88.451 kg)      Vital signs reviewed  05/16/2022  - Note at rest 02 sats  94% on RA   General appearance:    intermittently harsh upper airway pattern cough      HEENT : Oropharynx  clear       NECK :  without  apparent JVD/ palpable Nodes/TM    LUNGS: no acc muscle use,  Nl contour chest which is clear to A and P bilaterally without cough on insp or exp maneuvers   CV:  RRR  no s3 or murmur or increase in P2, and no edema   ABD:  soft and nontender with nl inspiratory excursion in the supine position. No bruits or organomegaly appreciated   MS:  Nl gait/ ext warm with R BKA prosthesis - no other  obvious joint restrictions  calf tenderness, cyanosis or clubbing    SKIN: warm and dry without lesions    NEURO:  alert, approp, nl sensorium with  no motor or cerebellar deficits apparent.            Assessment & Plan:

## 2022-05-18 NOTE — Progress Notes (Unsigned)
Patient: Shelly [name] Date of Birth: 02-Feb-1951  Reason for Visit: Follow up for memory History from: Patient, alone Primary Neurologist: Dr. Brett Fairy  ASSESSMENT AND PLAN 71 y.o. year old female   1.  Mild cognitive impairment 2.  Anxiety, mood disorder -Memory appears overall stable, MMSE 24/30 -Plan for repeat neuropsychological evaluation around August 2024-early 2025 with Dr. Cecelia Byars; in summary felt consistent with mild cognitive impairment likely multifactorial contribution from sleep patterns, chronic pain, anxiety, mood -Recommend brain stimulating exercises, consistent physical activity; good manage meant of vascular risk factors; continue follow-up with psychiatry; healthy eating; consistent sleep pattern -Follow-up here in 1 year or sooner if needed  HISTORY OF PRESENT ILLNESS: Today 05/19/22 Doing well, MMSE 24/30 today. Did 6 weeks of counseling, was feeling overwhelmed, now feeling much better. Doesn't think she snores, no dry mouth, or apnea, no morning headache. Sleep is better, taking Nervive, helps with achy pains. Goes to bed 9 PM, 4 AM to go to work. She is a Surveyor, minerals for 71 year old. After having neuropsychological evaluation, she felt reassured, feels memory issues are better. Working on anxiety. Had audiology evaluation, told needed hearing aids, too expensive. Has lost 12 lbs in the last few weeks, has changed her diet. Seeing pulmonary for lung issues.  Lives alone, drives a car.  Received neuropsychological evaluation from Dr. Cecelia Byars 01/10/22, consistent with unspecified mild cognitive impairment that is likely multifactorial.  Neurodegenerative process cannot fully be ruled out, but there is likely contribution from recent fractionated sleep patterns, her chronic pain, generalized anxiety, mood issues.  Could also be a vascular component given evidence of executive dysfunction on testing and presence of vascular risk factors and her medical history. Recommended  repeat neuropsychological testing in 12 to 18 months.   Recommendations -Hearing evaluation with audiologist -Better sleep habits -Sleep apnea, OSA? -Recommended psychotherapy -Staying active, brain healthy diet  09/28/21 SS: Shelly [name] here today for follow-up. [place] 23/30. Was 25/30 at last visit. Thinks memory is declining. Her mother had dementia, she is paranoid this is happening to her. Lives alone, does everything independently. Worries her, goes to room can't remember what she was going to do. Drives a car well, but is getting concerned about night driving, judging distances. 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 Keeps lists on her phone for groceries. Pays her own bills. CT brain Feb 2022 showed age related changes, can't have any MRI due to spinal stimulator. Had cognitive training for speech therapy. Was helpful and gave her confidence. A lot of involvement in church. Does have anxiety is on Prozac, Wellbutrin.   HISTORY  03/02/2021 Dr. Jannifer [name]: Shelly [name] is a 71 year old right-handed white female with a history of a mild memory disorder.  The patient has not given up any activities of daily living because of this.  She at times will have some difficulty remembering directions to areas that she has been before.  She manages her medications, appointments, and finances quite well.  She reports a short-term memory issue, sometimes she will walk into a room and cannot remember why she is there.  She may have some word finding difficulty, and difficulty remembering names for things.  The patient usually sleeps fairly well.  She does have some spine pain, she has a spinal stimulator in place.  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 Sharol Given for this.  She has had  multiple falls recently, most of them have been related to prosthetic failure or tripping over something.  She returns to the office today for further  evaluation.  REVIEW OF SYSTEMS: Out of a complete 14 system review of symptoms, the patient complains only of the following symptoms, and all other reviewed systems are negative.  See HPI  ALLERGIES: No Known Allergies  HOME MEDICATIONS: Outpatient Medications Prior to Visit  Medication Sig Dispense Refill   Acetylcarnitine HCl (ACETYL L-CARNITINE) 500 MG CAPS Take by mouth daily.     acidophilus (RISAQUAD) CAPS capsule Take 1 capsule by mouth daily.     aspirin EC 81 MG tablet TAKE 1 TABLET(81 MG) BY MOUTH DAILY 90 tablet 3   B Complex-Folic Acid (B COMPLEX PLUS PO) 1 tablet     benzonatate (TESSALON) 200 MG capsule TAKE 1 CAPSULE(200 MG) BY MOUTH THREE TIMES DAILY AS NEEDED FOR COUGH 90 capsule 2   Biotin 5000 MCG TABS Take 10,000 mcg by mouth every evening.     buPROPion (WELLBUTRIN XL) 300 MG 24 hr tablet Take 300 mg by mouth daily.     Calcium Carbonate (CALCIUM 600 PO) Take by mouth daily.     chlorpheniramine (CHLOR-TRIMETON) 4 MG tablet Take 8 mg by mouth 2 (two) times a day.      Cholecalciferol (VITAMIN D-3) 125 MCG (5000 UT) TABS Take by mouth daily.     ciclopirox (PENLAC) 8 % solution Apply topically at bedtime. Apply over nail and surrounding skin. Apply daily over previous coat. After seven (7) days, may remove with alcohol and continue cycle. 6.6 mL 0   Cyanocobalamin (B-12 PO) Take 5,000 mcg by mouth daily.      denosumab (PROLIA) 60 MG/ML SOLN injection Inject 60 mg into the skin every 6 (six) months.      FLUoxetine (PROZAC) 20 MG capsule Take 20 mg by mouth daily.     gabapentin (NEURONTIN) 100 MG capsule Take 1 capsule (100 mg total) by mouth 3 (three) times daily. 90 capsule 3   losartan (COZAAR) 50 MG tablet Take 50 mg by mouth daily.      MAGNESIUM GLYCINATE PO Take by mouth.     methocarbamol (ROBAXIN) 500 MG tablet Take 500 mg by mouth every 8 (eight) hours as needed.     montelukast (SINGULAIR) 10 MG tablet TAKE 1 TABLET BY MOUTH EVERY DAY 90 tablet 1    Multiple Vitamins-Minerals (CENTRUM SILVER PO) Take 1 tablet by mouth daily.     neomycin-polymyxin-hydrocortisone (CORTISPORIN) 3.5-10000-1 OTIC suspension Apply 1-2 drops daily after soaking and cover with bandaid 10 mL 0   pantoprazole (PROTONIX) 40 MG tablet TAKE 1 TABLET(40 MG) BY MOUTH  in the morning and and 1 tablet in the evening 30 TO 60 MINUTES BEFORE FIRST MEAL OF THE DAY. 180 tablet 1   Potassium 99 MG TABS Take by mouth.     rosuvastatin (CRESTOR) 10 MG tablet Take 10 mg by mouth every evening.   1   SYMBICORT 80-4.5 MCG/ACT inhaler Inhale 2 puffs into the lungs 2 (two) times daily. 30.6 g 0   Theanine 50 MG TBDP Take by mouth.     traMADol (ULTRAM) 50 MG tablet Take 50 mg by mouth 3 (three) times daily as needed.     Turmeric (QC TUMERIC COMPLEX PO) Take 1,400 mg by mouth daily.     valACYclovir (VALTREX) 1000 MG tablet Take 1,000 mg by mouth daily as needed.      No facility-administered medications  prior to visit.    PAST MEDICAL HISTORY: Past Medical History:  Diagnosis Date   Anxiety    Arthritis    Asthma    Avascular necrosis of talus ([place])    Cancer ([place])    vulva pre cancer had surgery   Depression    Gait abnormality 03/20/2017   GERD (gastroesophageal reflux disease)    Hearing loss    "very minor"   History of pneumonia    Hyperlipidemia    Hypertension    Leg edema    left   Memory difficulty 03/20/2017   PONV (postoperative nausea and vomiting)    Stress incontinence     PAST SURGICAL HISTORY: Past Surgical History:  Procedure Laterality Date   ANKLE FUSION  2011   right multiple    ANKLE FUSION     rear ankle fusion   ANKLE SURGERY  2010   right cordicompression   AORTIC ARCH ANGIOGRAPHY N/A 12/11/2017   Procedure: AORTIC ARCH ANGIOGRAPHY;  Surgeon: Elam Dutch, MD;  Location: [facility];  Service: Cardiovascular;  Laterality: N/A;   APPENDECTOMY  05/10/2016   BELOW KNEE LEG AMPUTATION     CATARACT EXTRACTION W/ INTRAOCULAR  LENS IMPLANT Left    LAPAROSCOPIC APPENDECTOMY N/A 05/10/2016   Procedure: APPENDECTOMY LAPAROSCOPIC;  Surgeon: Coralie Keens, MD;  Location: [facility];  Service: General;  Laterality: N/A;   LUMBAR LAMINECTOMY/DECOMPRESSION MICRODISCECTOMY N/A 09/09/2015   Procedure:  L4-S1 Decompression/ Discetomy;  Surgeon: Melina Schools, MD;  Location: [facility];  Service: Orthopedics;  Laterality: N/A;   SKIN GRAFT     SPINAL CORD STIMULATOR INSERTION N/A 01/10/2019   Procedure: LUMBAR SPINAL CORD STIMULATOR INSERTION;  Surgeon: Melina Schools, MD;  Location: [facility];  Service: Orthopedics;  Laterality: N/A;  2.5 hrs   [place] N/A 01/28/2015   Procedure: WIDE EXCISION VULVECTOMY;  Surgeon: Thurnell Lose, MD;  Location: [facility];  Service: Gynecology;  Laterality: N/A;    FAMILY HISTORY: Family History  Problem Relation Age of Onset   Dementia Mother 13       alive   Fibromyalgia Mother    Allergies Mother    Heart attack Father 20       deceased   Multiple myeloma Father    Heart murmur Sister 67       she had open heart surgery   Other Sister 77       She is bIpolar- diabetic   Breast cancer Sister    Other Brother        alive   Heart disease Brother        emergent CABG for 99% blocked CAD    SOCIAL HISTORY: Social History   Socioeconomic History   Marital status: Divorced    Spouse name: Not on file   Number of children: 3   Years of education: 14   Highest education level: Not on file  Occupational History   Occupation: ACCOUNTING    Employer: [employer]  Tobacco Use   Smoking status: Former    Packs/day: 1.00    Years: 40.00    Total pack years: 40.00    Types: Cigarettes    Quit date: 02/05/2007    Years since quitting: 15.2   Smokeless tobacco: Never  Vaping Use   Vaping Use: Never used  Substance and Sexual Activity   Alcohol use: Yes    Alcohol/week: 0.0 standard drinks of alcohol  Comment: socially   Drug use: No   Sexual  activity: Not on file  Other Topics Concern   Not on file  Social History Narrative   Lives alone   Caffeine use: Tea every morning    Right handed    Social Determinants of Health   Financial Resource Strain: Not on file  Food Insecurity: Not on file  Transportation Needs: Not on file  Physical Activity: Not on file  Stress: Not on file  Social Connections: Not on file  Intimate Partner Violence: Not on file    PHYSICAL EXAM  Vitals:   05/19/22 1241  BP: 114/70  Pulse: 87  Weight: 183 lb (83 kg)  Height: _0  (1.651 m)    Body mass index is 30.45 kg/m.    09/28/2021    1:59 PM 03/02/2021    7:42 AM  [place] Cognitive Assessment   Visuospatial/ Executive (0/5) 4 4  Naming (0/3) 3 3  Attention: Read list of digits (0/2) 2 2  Attention: Read list of letters (0/1) 1 1  Attention: Serial 7 subtraction starting at 100 (0/3) 3 3  Language: Repeat phrase (0/2) 1 2  Language : Fluency (0/1) 1 1  Abstraction (0/2) 1 2  Delayed Recall (0/5) 1 1  Orientation (0/6) 6 6  Total 23 25      05/19/2022   12:44 PM 09/18/2017   12:07 PM  MMSE - Mini Mental State Exam  Orientation to time 4 5  Orientation to Place 5 5  Registration 3 3  Attention/ Calculation 1 5  Recall 2 2  Language- name 2 objects 2 2  Language- repeat 1 1  Language- follow 3 step command 3 3  Language- read & follow direction 1 1  Write a sentence 1 1  Copy design 1 1  Total score 24 29   Generalized: Well developed, in no acute distress  Neurological examination  Mentation: Alert oriented to time, place, history taking. Follows all commands speech and language fluent Cranial nerve II-XII: Pupils were equal round reactive to light. Extraocular movements were full, visual field were full on confrontational test. Facial sensation and strength were normal.  Head turning and shoulder shrug  were normal and symmetric. Motor: right BKA wearing prosthesis.  Sensory: Sensory testing is intact to soft  touch on all 4 extremities. No evidence of extinction is noted.  Coordination: Cerebellar testing reveals good finger-nose-finger bilaterally Gait and station: Gait is normal.  Reflexes: Deep tendon reflexes are symmetric and normal bilaterally.   DIAGNOSTIC DATA (LABS, IMAGING, TESTING) - I reviewed patient records, labs, notes, testing and imaging myself where available.  Lab Results  Component Value Date   WBC 10.3 01/11/2019   HGB 10.4 (L) 01/11/2019   HCT 34.0 (L) 01/11/2019   MCV 98.0 01/11/2019   PLT 309 01/11/2019      Component Value Date/Time   NA 136 01/11/2019 1432   NA 142 09/18/2017 1244   K 3.8 01/11/2019 1432   CL 98 01/11/2019 1432   CO2 28 01/11/2019 1432   GLUCOSE 99 01/11/2019 1432   BUN 15 01/11/2019 1432   BUN 15 09/18/2017 1244   CREATININE 0.95 01/11/2019 1432   CREATININE 0.83 02/12/2015 0847   CALCIUM 8.7 (L) 01/11/2019 1432   PROT 6.6 01/11/2019 1432   ALBUMIN 3.2 (L) 01/11/2019 1432   AST 31 01/11/2019 1432   ALT 21 01/11/2019 1432   ALKPHOS 56 01/11/2019 1432   BILITOT 0.3 01/11/2019 1432  GFRNONAA >60 01/11/2019 1432   GFRAA >60 01/11/2019 1432   No results found for: "CHOL", "HDL", "LDLCALC", "LDLDIRECT", "TRIG", "CHOLHDL" No results found for: "HGBA1C" Lab Results  Component Value Date   VITAMINB12 >2000 (H) 03/20/2017   No results found for: "TSH"  Butler Denmark, AGNP-C, DNP 05/19/2022, 12:56 PM [facility] [address], [place],  [address] [phone]

## 2022-05-19 ENCOUNTER — Ambulatory Visit (INDEPENDENT_AMBULATORY_CARE_PROVIDER_SITE_OTHER): Payer: Medicare Other | Admitting: Neurology

## 2022-05-19 ENCOUNTER — Encounter: Payer: Self-pay | Admitting: Neurology

## 2022-05-19 VITALS — BP 114/70 | HR 87 | Ht 65.0 in | Wt 183.0 lb

## 2022-05-19 DIAGNOSIS — G3184 Mild cognitive impairment, so stated: Secondary | ICD-10-CM | POA: Diagnosis not present

## 2022-05-19 DIAGNOSIS — F419 Anxiety disorder, unspecified: Secondary | ICD-10-CM | POA: Insufficient documentation

## 2022-05-19 DIAGNOSIS — F32A Depression, unspecified: Secondary | ICD-10-CM | POA: Insufficient documentation

## 2022-05-19 NOTE — Patient Instructions (Addendum)
Great see you today  Continue seeing your therapist  Will plan for repeat neuropsych eval in August 2024- early 2025 Recommend brain stimulating exercises, physical exercises Good management of BP, glucose, cholesterol, weight  Monitor for any signs of sleep apnea (snoring, morning headache, daytime fatigue) See you back in 1 year

## 2022-05-24 ENCOUNTER — Telehealth: Payer: Self-pay | Admitting: Internal Medicine

## 2022-05-24 NOTE — Telephone Encounter (Signed)
Called patient and went over patient medication with her. She had some questions if it makes her sleepy. I told her no and that she should use caution since its a brand new medication. Nothing further needed

## 2022-05-26 ENCOUNTER — Telehealth: Payer: Self-pay | Admitting: Internal Medicine

## 2022-05-26 NOTE — Telephone Encounter (Signed)
Called patient and she states that she threw away her old AVS with the cough guidelines. She wanted to know if she could get another copy. AVS with cough guideline printed. Nothing further needed

## 2022-05-27 ENCOUNTER — Telehealth: Payer: Self-pay

## 2022-05-27 NOTE — Telephone Encounter (Signed)
Encounter created in error

## 2022-06-13 ENCOUNTER — Ambulatory Visit: Payer: Medicare Other | Admitting: Internal Medicine

## 2022-06-15 DIAGNOSIS — L814 Other melanin hyperpigmentation: Secondary | ICD-10-CM | POA: Diagnosis not present

## 2022-06-15 DIAGNOSIS — D225 Melanocytic nevi of trunk: Secondary | ICD-10-CM | POA: Diagnosis not present

## 2022-06-15 DIAGNOSIS — D485 Neoplasm of uncertain behavior of skin: Secondary | ICD-10-CM | POA: Diagnosis not present

## 2022-06-15 DIAGNOSIS — R21 Rash and other nonspecific skin eruption: Secondary | ICD-10-CM | POA: Diagnosis not present

## 2022-06-15 DIAGNOSIS — D1801 Hemangioma of skin and subcutaneous tissue: Secondary | ICD-10-CM | POA: Diagnosis not present

## 2022-06-15 DIAGNOSIS — L821 Other seborrheic keratosis: Secondary | ICD-10-CM | POA: Diagnosis not present

## 2022-06-15 DIAGNOSIS — D0439 Carcinoma in situ of skin of other parts of face: Secondary | ICD-10-CM | POA: Diagnosis not present

## 2022-06-15 DIAGNOSIS — L438 Other lichen planus: Secondary | ICD-10-CM | POA: Diagnosis not present

## 2022-06-15 DIAGNOSIS — L570 Actinic keratosis: Secondary | ICD-10-CM | POA: Diagnosis not present

## 2022-06-20 DIAGNOSIS — F419 Anxiety disorder, unspecified: Secondary | ICD-10-CM | POA: Diagnosis not present

## 2022-06-20 DIAGNOSIS — F32A Depression, unspecified: Secondary | ICD-10-CM | POA: Diagnosis not present

## 2022-07-12 DIAGNOSIS — K219 Gastro-esophageal reflux disease without esophagitis: Secondary | ICD-10-CM | POA: Diagnosis not present

## 2022-07-12 DIAGNOSIS — E785 Hyperlipidemia, unspecified: Secondary | ICD-10-CM | POA: Diagnosis not present

## 2022-07-12 DIAGNOSIS — J45991 Cough variant asthma: Secondary | ICD-10-CM | POA: Diagnosis not present

## 2022-07-12 DIAGNOSIS — I10 Essential (primary) hypertension: Secondary | ICD-10-CM | POA: Diagnosis not present

## 2022-07-21 ENCOUNTER — Other Ambulatory Visit: Payer: Self-pay | Admitting: Internal Medicine

## 2022-07-21 ENCOUNTER — Telehealth: Payer: Self-pay | Admitting: Internal Medicine

## 2022-07-21 MED ORDER — CEFDINIR 300 MG PO CAPS: 300.0000 mg | ORAL_CAPSULE | Freq: Two times a day (BID) | ORAL | 0 refills | Status: DC

## 2022-07-21 NOTE — Telephone Encounter (Signed)
Ok to use omnicef 300 mg bid x 10 days

## 2022-07-21 NOTE — Telephone Encounter (Signed)
Patient called to request a refill for an antibiotic that she has taken before for her cough and mucus .  She stated it helped before and she thinks it is C[place].  Please advise and call patient to discuss at [phone]

## 2022-07-21 NOTE — Telephone Encounter (Signed)
Called and spoke with patient. She verbalized understanding. Patient verified her pharmacy. Rx has been sent to the patients pharmacy.   Nothing further needed.

## 2022-07-21 NOTE — Telephone Encounter (Signed)
Called and spoke with patient. Patient stated that she has had a Cough for a week and a half and she is starting to cough up green phlegm. Patient stated that she wanted to see if we could send in cefdinir for here because it helped her the last time she had a cough and green phlegm.   MW, please advise.

## 2022-07-28 ENCOUNTER — Ambulatory Visit (INDEPENDENT_AMBULATORY_CARE_PROVIDER_SITE_OTHER): Payer: Medicare Other | Admitting: Podiatry

## 2022-07-28 DIAGNOSIS — M79674 Pain in right toe(s): Secondary | ICD-10-CM

## 2022-07-28 DIAGNOSIS — B351 Tinea unguium: Secondary | ICD-10-CM | POA: Diagnosis not present

## 2022-07-28 DIAGNOSIS — M79675 Pain in left toe(s): Secondary | ICD-10-CM | POA: Diagnosis not present

## 2022-07-31 NOTE — Progress Notes (Signed)
  Subjective:  Patient ID: Shelly [name], female    DOB: 11-04-1950,  MRN: [id number]  Chief Complaint  Patient presents with   Nail Problem    Thick painful toenails, 3 month follow up    72 y.o. female presents with the above complaint. History confirmed with patient.  Left great toenail is doing much better now that has been removed and healed.  Remaining nails are thick and elongated causing discomfort  Objective:  Physical Exam: warm, good capillary refill, no trophic changes or ulcerative lesions, normal DP and PT pulses, and normal sensory exam. Left Foot: Thickened elongated toenails with subungual debris and discoloration, significantly dystrophic thickened great toenail   Radiographs: Multiple views x-ray of the left foot: No osseous abnormalities in the area of concern there is prominence of the cuboid and fifth met base     Assessment:   1. Pain due to onychomycosis of toenails of both feet       Plan:  Patient was evaluated and treated and all questions answered.  She will continue the [place] for the lesser toenails.  Recommended debridement of the nails today. Sharp and mechanical debridement performed of all painful and mycotic nails today. Nails debrided in length and thickness using a nail nipper to level of comfort.  Hallux nail avulsion has been healing well.    No follow-ups on file.

## 2022-08-01 DIAGNOSIS — M810 Age-related osteoporosis without current pathological fracture: Secondary | ICD-10-CM | POA: Diagnosis not present

## 2022-08-06 ENCOUNTER — Encounter: Payer: Self-pay | Admitting: Internal Medicine

## 2022-08-07 NOTE — Progress Notes (Unsigned)
Cardiology Office Note   Date:  08/08/2022   ID:  Shelly [name], Shelly [name] [birth year], MRN [id number]  PCP:  Carol Ada, MD    No chief complaint on file.  Coronary artery calcification  Wt Readings from Last 3 Encounters:  08/08/22 183 lb 3.2 oz (83.1 kg)  05/19/22 183 lb (83 kg)  05/16/22 183 lb 9.6 oz (83.3 kg)       History of Present Illness: Shelly [name] is a 72 y.o. female  Who had coronary calcium noted in 2015.  She was asymptomatic and then had a stress test that was negative.  She has RF for CAD including high cholesterol.  Her brother had CABG in he past few years.   SHe had back surgery in 2017 and there were no cardiac issues at that time.     She saw vascular surgery as follows: "Shelly [name] is a 72 y.o. female referred for evaluation after recent TIA event with right subclavian stenosis.  On June 1 of this year the patient had an event where she had severe confusion and difficulty driving and understanding her actions.  She was sent to the emergency room for evaluation and had a CT Angio of the neck which showed a right subclavian artery stenosis an MRI scan which showed no evidence of stroke but possible symptoms of TIA.  She was also seen by her neurologist.  She currently is on aspirin and Crestor.  She denies any prior TIA events.  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  She did have an MRI in October 2018 which showed no infarct.  "   Angio by Dr. Oneida Alar in 7/19 showed: " The patient apparently had a recent TIA event.  CT scan suggested a high-grade right subclavian artery stenosis.  She has symmetric blood pressures.  Angiogram shows 40% of the most narrowing of the right subclavian artery.  I do not believe that this is the etiology of her TIA.  The rest of her great vessels are widely patent.  I do not believe from a vascular standpoint we can explain her current TIA symptoms.  I will defer this to  her neurologist Dr. [name] to see if any other further work-up or evaluation is necessary.  From my standpoint it is okay for the patient to drive starting tomorrow.."   She has been checked for dementia.  She had some vascular changes in her brain   In Jan 2020, she was considering Keto diet.  I explained that from a heart standpoint, plant based and mediterranean diet have better data looking forward at reducing coronary events.  She has had cataract surgery, spinal cord stimulator in 2020.   Exercise in the past: "She walks with her dog, 20-25 miles/week.  Limited by leg pain, but not from a cardiac standpoint."   She has had a persistent cough for several years.  Related to GERD.    Got a new prosthetic leg in 2021.  Has articulating ankle and falls had stopped.  Having some falls of late.   In 2022, Went to [place] to take care of grandkids. [place] there due to prosthesis.  Negative CT head and xrays of hip.   As of 2024, feels memory is a little worse.  She does not like to cook since she lives alone.  She does not eat as healthy as she could.  Exercise somewhat limited by prior amputation.  No dizziness with using her right arm.  Denies : Chest pain,  Dizziness. Leg edema. Nitroglycerin use. Orthopnea. Palpitations. Paroxysmal nocturnal dyspnea. Shortness of breath. Syncope.      Past Medical History:  Diagnosis Date   Anxiety    Arthritis    Asthma    Avascular necrosis of talus ([place])    Cancer ([place])    vulva pre cancer had surgery   Depression    Gait abnormality 03/20/2017   GERD (gastroesophageal reflux disease)    Hearing loss    "very minor"   History of pneumonia    Hyperlipidemia    Hypertension    Leg edema    left   Memory difficulty 03/20/2017   PONV (postoperative nausea and vomiting)    Stress incontinence     Past Surgical History:  Procedure Laterality Date   ANKLE FUSION  2011   right multiple    ANKLE FUSION     rear ankle fusion   ANKLE SURGERY   2010   right cordicompression   AORTIC ARCH ANGIOGRAPHY N/A 12/11/2017   Procedure: AORTIC ARCH ANGIOGRAPHY;  Surgeon: Elam Dutch, MD;  Location: [facility];  Service: Cardiovascular;  Laterality: N/A;   APPENDECTOMY  05/10/2016   BELOW KNEE LEG AMPUTATION     CATARACT EXTRACTION W/ INTRAOCULAR LENS IMPLANT Left    LAPAROSCOPIC APPENDECTOMY N/A 05/10/2016   Procedure: APPENDECTOMY LAPAROSCOPIC;  Surgeon: Coralie Keens, MD;  Location: [facility];  Service: General;  Laterality: N/A;   LUMBAR LAMINECTOMY/DECOMPRESSION MICRODISCECTOMY N/A 09/09/2015   Procedure:  L4-S1 Decompression/ Discetomy;  Surgeon: Melina Schools, MD;  Location: [facility];  Service: Orthopedics;  Laterality: N/A;   SKIN GRAFT     SPINAL CORD STIMULATOR INSERTION N/A 01/10/2019   Procedure: LUMBAR SPINAL CORD STIMULATOR INSERTION;  Surgeon: Melina Schools, MD;  Location: [facility];  Service: Orthopedics;  Laterality: N/A;  2.5 hrs   [place] N/A 01/28/2015   Procedure: WIDE EXCISION VULVECTOMY;  Surgeon: Thurnell Lose, MD;  Location: [facility];  Service: Gynecology;  Laterality: N/A;     Current Outpatient Medications  Medication Sig Dispense Refill   Acetylcarnitine HCl (ACETYL L-CARNITINE) 500 MG CAPS Take by mouth daily.     acidophilus (RISAQUAD) CAPS capsule Take 1 capsule by mouth daily.     aspirin EC 81 MG tablet TAKE 1 TABLET(81 MG) BY MOUTH DAILY 90 tablet 3   B Complex-Folic Acid (B COMPLEX PLUS PO) 1 tablet     benzonatate (TESSALON) 200 MG capsule TAKE 1 CAPSULE(200 MG) BY MOUTH THREE TIMES DAILY AS NEEDED FOR COUGH 90 capsule 2   Biotin 5000 MCG TABS Take 10,000 mcg by mouth every evening.     buPROPion (WELLBUTRIN XL) 300 MG 24 hr tablet Take 300 mg by mouth daily.     Calcium Carbonate (CALCIUM 600 PO) Take by mouth daily.     chlorpheniramine (CHLOR-TRIMETON) 4 MG tablet Take 8 mg by mouth 2 (two) times a day.      Cholecalciferol (VITAMIN D-3) 125 MCG (5000 UT)  TABS Take by mouth daily.     Cyanocobalamin (B-12 PO) Take 5,000 mcg by mouth daily.      denosumab (PROLIA) 60 MG/ML SOLN injection Inject 60 mg into the skin every 6 (six) months.      FLUoxetine (PROZAC) 20 MG capsule Take 20 mg by mouth daily.     gabapentin (NEURONTIN) 100 MG capsule Take 1  capsule (100 mg total) by mouth 3 (three) times daily. 90 capsule 3   losartan (COZAAR) 50 MG tablet Take 50 mg by mouth daily.      MAGNESIUM GLYCINATE PO Take by mouth.     methocarbamol (ROBAXIN) 500 MG tablet Take 500 mg by mouth every 8 (eight) hours as needed.     montelukast (SINGULAIR) 10 MG tablet TAKE 1 TABLET BY MOUTH EVERY DAY 90 tablet 1   Multiple Vitamins-Minerals (CENTRUM SILVER PO) Take 1 tablet by mouth daily.     neomycin-polymyxin-hydrocortisone (CORTISPORIN) 3.5-10000-1 OTIC suspension Apply 1-2 drops daily after soaking and cover with bandaid 10 mL 0   pantoprazole (PROTONIX) 40 MG tablet Take 1 tablet (40 mg total) by mouth 2 (two) times daily. Take 30 TO 60 MINUTES BEFORE FIRST and LAST MEAL OF THE DAY. 180 tablet 3   Potassium 99 MG TABS Take by mouth.     rosuvastatin (CRESTOR) 10 MG tablet Take 10 mg by mouth every evening.   1   SYMBICORT 80-4.5 MCG/ACT inhaler Inhale 2 puffs into the lungs 2 (two) times daily. 30.6 g 0   Theanine 50 MG TBDP Take by mouth.     traMADol (ULTRAM) 50 MG tablet Take 50 mg by mouth 3 (three) times daily as needed.     Turmeric (QC TUMERIC COMPLEX PO) Take 1,400 mg by mouth daily.     valACYclovir (VALTREX) 1000 MG tablet Take 1,000 mg by mouth daily as needed.      cefdinir (OMNICEF) 300 MG capsule Take 1 capsule (300 mg total) by mouth 2 (two) times daily. (Patient not taking: Reported on 08/08/2022) 20 capsule 0   ciclopirox (PENLAC) 8 % solution Apply topically at bedtime. Apply over nail and surrounding skin. Apply daily over previous coat. After seven (7) days, may remove with alcohol and continue cycle. (Patient not taking: Reported on  08/08/2022) 6.6 mL 0   No current facility-administered medications for this visit.    Allergies:   Patient has no known allergies.    Social History:  The patient  reports that she quit smoking about 15 years ago. Her smoking use included cigarettes. She has a 40.00 pack-year smoking history. She has never used smokeless tobacco. She reports current alcohol use. She reports that she does not use drugs.   Family History:  The patient's family history includes Allergies in her mother; Breast cancer in her sister; Dementia (age of onset: 17) in her mother; Fibromyalgia in her mother; Heart attack (age of onset: 86) in her father; Heart disease in her brother; Heart murmur (age of onset: 2) in her sister; Multiple myeloma in her father; Other in her brother; Other (age of onset: 49) in her sister.    ROS:  Please see the history of present illness.   Otherwise, review of systems are positive for cough in Dec and Jan, left leg pain  All other systems are reviewed and negative.    PHYSICAL EXAM: VS:  BP 134/70   Pulse 90   Ht 5' 3.5" (1.613 m)   Wt 183 lb 3.2 oz (83.1 kg)   LMP  (LMP Unknown)   SpO2 94%   BMI 31.94 kg/m  , BMI Body mass index is 31.94 kg/m. GEN: Well nourished, well developed, in no acute distress HEENT: normal Neck: no JVD, carotid bruits, or masses Cardiac: RRR; no murmurs, rubs, or gallops,no edema  Respiratory:  clear to auscultation bilaterally, normal work of breathing GI: soft, nontender, nondistended, +  BS MS: no deformity or atrophy; 2_+ radial pulses bilaterally Skin: warm and dry, no rash Neuro:  Strength and sensation are intact Psych: euthymic mood, full affect   EKG:   The ekg ordered today demonstrates normal ECG   Recent Labs: No results found for requested labs within last 365 days.   Lipid Panel No results found for: "CHOL", "TRIG", "HDL", "CHOLHDL", "VLDL", "LDLCALC", "LDLDIRECT"   Other studies Reviewed: Additional studies/ records that  were reviewed today with results demonstrating: 07/2021 total cholesterol 169 HDL 59 LDL 83 triglycerides 165.   ASSESSMENT AND PLAN:  Coronary artery calcification: No angina.  Healthy lifestyle. Whole food, plant based diet.    Hypertension: Low salt diet.  Minimize processed foods.  Hyperlipidemia: Diet and exercise for healthy lifestyle.  Continue rosuvastatin.  Lipids well controlled.  PAD: Right subclavian steal noted on prior imaging.  No syncope with use of right arm.  Lower extremity edema: Elevate legs   Current medicines are reviewed at length with the patient today.  The patient concerns regarding her medicines were addressed.  The following changes have been made:  No change  Labs/ tests ordered today include:  No orders of the defined types were placed in this encounter.   Recommend 150 minutes/week of aerobic exercise Low fat, low carb, high fiber diet recommended  Disposition:   FU prn   Signed, Larae Grooms, MD  08/08/2022 3:38 PM    [facility] [address], [place], [place]  [address] Phone: [phone]; Fax: [phone]

## 2022-08-08 ENCOUNTER — Ambulatory Visit: Payer: Medicare Other | Attending: Interventional Cardiology | Admitting: Interventional Cardiology

## 2022-08-08 ENCOUNTER — Encounter: Payer: Self-pay | Admitting: Interventional Cardiology

## 2022-08-08 VITALS — BP 134/70 | HR 90 | Ht 63.5 in | Wt 183.2 lb

## 2022-08-08 DIAGNOSIS — I10 Essential (primary) hypertension: Secondary | ICD-10-CM | POA: Diagnosis not present

## 2022-08-08 DIAGNOSIS — I779 Disorder of arteries and arterioles, unspecified: Secondary | ICD-10-CM | POA: Diagnosis not present

## 2022-08-08 DIAGNOSIS — I2510 Atherosclerotic heart disease of native coronary artery without angina pectoris: Secondary | ICD-10-CM | POA: Insufficient documentation

## 2022-08-08 DIAGNOSIS — I2584 Coronary atherosclerosis due to calcified coronary lesion: Secondary | ICD-10-CM | POA: Diagnosis not present

## 2022-08-08 DIAGNOSIS — I771 Stricture of artery: Secondary | ICD-10-CM | POA: Diagnosis not present

## 2022-08-08 DIAGNOSIS — E785 Hyperlipidemia, unspecified: Secondary | ICD-10-CM | POA: Insufficient documentation

## 2022-08-08 MED ORDER — PANTOPRAZOLE SODIUM 40 MG PO TBEC: 40.0000 mg | DELAYED_RELEASE_TABLET | Freq: Two times a day (BID) | ORAL | 3 refills | Status: DC

## 2022-08-08 NOTE — Patient Instructions (Signed)
Medication Instructions:  Your physician recommends that you continue on your current medications as directed. Please refer to the Current Medication list given to you today.  *If you need a refill on your cardiac medications before your next appointment, please call your pharmacy*   Lab Work: none If you have labs (blood work) drawn today and your tests are completely normal, you will receive your results only by: [place] (if you have MyChart) OR A paper copy in the mail If you have any lab test that is abnormal or we need to change your treatment, we will call you to review the results.   Testing/Procedures: Your physician has requested that you have a carotid duplex. This test is an ultrasound of the carotid arteries in your neck. It looks at blood flow through these arteries that supply the brain with blood. Allow one hour for this exam. There are no restrictions or special instructions. To be done in November   Follow-Up: At [facility], you and your health needs are our priority.  As part of our continuing mission to provide you with exceptional heart care, we have created designated Provider Care Teams.  These Care Teams include your primary Cardiologist (physician) and Advanced Practice Providers (APPs -  Physician Assistants and Nurse Practitioners) who all work together to provide you with the care you need, when you need it.  We recommend signing up for the patient portal called "MyChart".  Sign up information is provided on this After Visit Summary.  MyChart is used to connect with patients for Virtual Visits (Telemedicine).  Patients are able to view lab/test results, encounter notes, upcoming appointments, etc.  Non-urgent messages can be sent to your provider as well.   To learn more about what you can do with MyChart, go to [email or web address].    Your next appointment:   12 month(s)  Provider:   Larae Grooms, MD

## 2022-08-08 NOTE — Addendum Note (Signed)
Addended by: Sharee Holster R on: 08/08/2022 04:53 PM   Modules accepted: Orders

## 2022-08-18 DIAGNOSIS — M5136 Other intervertebral disc degeneration, lumbar region: Secondary | ICD-10-CM | POA: Diagnosis not present

## 2022-08-18 DIAGNOSIS — Z79899 Other long term (current) drug therapy: Secondary | ICD-10-CM | POA: Diagnosis not present

## 2022-08-18 DIAGNOSIS — M5412 Radiculopathy, cervical region: Secondary | ICD-10-CM | POA: Diagnosis not present

## 2022-08-20 ENCOUNTER — Other Ambulatory Visit: Payer: Self-pay | Admitting: Internal Medicine

## 2022-08-22 ENCOUNTER — Ambulatory Visit (INDEPENDENT_AMBULATORY_CARE_PROVIDER_SITE_OTHER): Payer: Medicare Other | Admitting: Internal Medicine

## 2022-08-22 ENCOUNTER — Encounter: Payer: Self-pay | Admitting: Internal Medicine

## 2022-08-22 VITALS — BP 106/70 | HR 84 | Temp 98.2°F | Ht 63.5 in | Wt 184.2 lb

## 2022-08-22 DIAGNOSIS — J45991 Cough variant asthma: Secondary | ICD-10-CM | POA: Diagnosis not present

## 2022-08-22 DIAGNOSIS — R059 Cough, unspecified: Secondary | ICD-10-CM | POA: Diagnosis not present

## 2022-08-22 LAB — POCT EXHALED NITRIC OXIDE: FeNO level (ppb): 19

## 2022-08-22 MED ORDER — BENZONATATE 200 MG PO CAPS: 200.0000 mg | ORAL_CAPSULE | Freq: Three times a day (TID) | ORAL | 1 refills | Status: DC | PRN

## 2022-08-22 MED ORDER — METHYLPREDNISOLONE ACETATE 80 MG/ML IJ SUSP
120.0000 mg | Freq: Once | INTRAMUSCULAR | Status: AC
  Administered 2022-08-22: 120 mg via INTRAMUSCULAR

## 2022-08-22 NOTE — Patient Instructions (Signed)
Depomedrol  120 mg IM  today   Refill tessalon 200 mg every 6-8 hours as needed- stop it when better     Please schedule a follow up office visit in 6 weeks, call sooner if needed

## 2022-08-22 NOTE — Progress Notes (Unsigned)
Subjective:    Patient ID: Shelly [name], female    DOB: Apr 16, 1951       MRN: [id number]    Brief patient profile:  3 yowf mother of optimetrist  quit smoking 2008  watery eyes in her 60s year round ? Worse in winter eventually started on zyrtec 2013 and seemed to help s assoc cough/ wheeze then new onset cough Aug 2015 and proved refractory so referred to pulmonary clinic 05/15/14  by Dr Jeremy Johann    History of Present Illness  05/15/2014 1st [facility] office visit/ [facility]   Chief Complaint  Patient presents with   Pulmonary Consult    Referred by Dr. Carol Ada. Pt c/o cough since August 2015- cough is non prod and somtimes seems worse at night. She also c/o DOE with walking up stairs.    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   Prednisone 10 mg take  4 each am x 2 days,   2 each am x 2 days,  1 each am x 2 days and stop (this is to eliminate allergies and inflammation from coughing) Omeprazole 40 mg  Take 30-60 min before first meal of the day and Pepcid 20 mg one bedtime plus chlorpheniramine 4 mg x 2 at bedtime  GERD diet reviewed     05/28/2014 f/u ov/Sharmain Lastra re: cough since aug 2015 Chief Complaint  Patient presents with   Acute Visit    Pt states that after last visit her cough had pretty much resolved, but then started back about 1 wk ago.  Cough is non prod and "comes and goes".   95% better, still urge to clear  throat daytime on above rx/ confused about when to d/c what  No noct cough  at all  Sob doe x steps better Then gradually worse x one week , again dry and variable   Kouffman Reflux v Neurogenic Cough Differentiator Reflux Comments  Do you awaken from a sound sleep coughing violently?                            With trouble breathing? Not now   Do you have choking episodes when you cannot  Get enough air, gasping for air ?              Not now   Do you usually cough when you lie down into  The bed, or when you just lie down to rest ?                          Not now    Do you usually cough after meals or eating?         No   Do you cough when (or after) you bend over?    Still some  GERD SCORE     Kouffman Reflux v Neurogenic Cough Differentiator Neurogenic   Do you more-or-less cough all day long? no   Does change of temperature make you cough? no   Does laughing or chuckling cause you to cough? Still some   Do fumes (perfume, automobile fumes, burned  Toast, etc.,) cause you to cough ?      no   Does speaking, singing, or talking on the phone cause you to cough   ?               Not now   Neurogenic/Airway score       rec Prednisone 10 mg take  4 each am x 2 days,   2 each am x 2 days,  1 each am x 2 days and stop  Take Tessalon 4x daily  and supplement if needed with  tramadol 50 mg up to 2 every 4 hours to suppress the urge to cough. Swallowing water or using ice chips/non mint and menthol containing candies (such as lifesavers or sugarless jolly ranchers) are also effective.  You should rest your voice and avoid activities that you know make you cough. Once you have eliminated the cough for 3 straight days try reducing the tramadol first,  then the tessalon as tolerated Please see patient coordinator before you leave today  to schedule sinus and chest CT and fluoro > neg       10/18/2018   Pulmonary/ re-estabish/Shewanda Sharpe re:  Tendency to cough sporadic pattern x years, recurrent  since March 2020 while on singulair  Chief Complaint  Patient presents with   Pulmonary Consult    Self referral.  Pt states has had increased cough x 2 months.  Cough is currently non prod. She has not had to use her proair in the past wk.     was maint on singulair,  h1 x 2 hs and no gerd rx then acutely worse x 2 months s obvious trigger Typically year round pattern is that every few weeks the cough starts back up  Dyspnea:  MMRC1 = even on best edays  can walk nl pace, flat grade, can't hurry or go uphills or steps s sob   Cough: mostly dry / gag ? Sometime vomit when cough is severe and "almost pass out"  Sleeping: bed is flat / cough was bad at night better now  SABA use: saba helped as does prednisone  02: 02  Using lots of mints  acei intol ? When  d/c'd (started summer 2019)  rec Plan A = Automatic =Dulera 100 Take 2 puffs first thing in am and then another 2 puffs about 12 hours later and continue singulair 10 mg each pm  Work on inhaler technique:   Plan B = Backup Only use your albuterol inhaler as a rescue medication  Whenever cough start  prilosec 20 mg Take 30- 60 min before your first and last meals of the day until no longer coughing and  For drainage / throat tickle try take CHLORPHENIRAMINE  4 mg  (Chlortab 4mg   at [employer] should be easiest to find in the green box)  take one every 4 hours as needed - available over the counter- may cause drowsiness so start with just a bedtime dose or two and see how you tolerate it before trying in daytime      02/21/2022  f/u ov/Hayze Gazda re: uacs vs cough variant asthma  maint on symbiocrt 80  avg not even  q week/ seems to correlete with eating chilly / protonix 40 mg  not taking ac  Chief Complaint  Patient presents with   Follow-up    Some coughing at times   Dyspnea: MMRC1 = can walk nl pace, flat grade, can't hurry or go uphills or steps s sob   Cough: ok now, usually dry  Sleeping: no resp cc SABA use: none  02: none   Rec Pantoprazole 40 mg  Take 30- 60 min before your first and last meals of the day  I will see if we can get you 3 months of Symbicort 80  Strongly consider 6-8 in bed blocks     05/16/2022  f/u ov/Evalynn Hankins re: cough x 2015  maint on Protonix  / s/p "sick cough" rx with omnicef and resolved  Chief Complaint  Patient presents with   Follow-up    Cough much improved today.  Pt c/o coughing x 4 weeks  Has been coughing daily x 2015 / worse with laughter/ better with tessalon  Dyspnea:  Not limited by breathing from desired activities   Cough: minimal mucoid Sleeping: flat bed one pillow  SABA use: occ symbicort 80 02: none  Covid status:   ? How many vax , infected x one Rec Pantoprazole 40 mg should be Take 30- 60 min before your first and last meals of the day  Gabapentin 100 mg three times a day  Continue chlorpheniramine as needed    08/22/2022  f/u  ov/Demon Volante re: recurrent cough flared on 08/18/22 while on max ppi  and tessalon  but not gabapentin 100 and started up symbicort 80 w/in 24h  of cough Chief Complaint  Patient presents with   Follow-up    Had been doing well.  Wheeze and cough x 3 days.  Dyspnea: Not limited by breathing from desired activities   Cough: hacking clear worse as day goes on  Sleeping: flat bed one pillow SABA use: none    No obvious day to day or daytime variability or assoc excess/ purulent sputum or mucus plugs or hemoptysis or cp or chest tightness, subjective wheeze or overt sinus or hb symptoms.   *** without nocturnal  or early am exacerbation  of respiratory  c/o's or need for noct saba. Also denies any obvious fluctuation of symptoms with weather or environmental changes or other aggravating or alleviating factors except as outlined above   No unusual exposure hx or h/o childhood pna/ asthma or knowledge of premature birth.  Current Allergies, Complete Past Medical History, Past Surgical History, Family History, and Social History were reviewed  in [employer] record.  ROS  The following are not active complaints unless bolded Hoarseness, sore throat, dysphagia, dental problems, itching, sneezing,  nasal congestion or discharge of excess mucus or purulent secretions, ear ache,   fever, chills, sweats, unintended wt loss or wt gain, classically pleuritic or exertional cp,  orthopnea pnd or arm/hand swelling  or leg swelling, presyncope, palpitations, abdominal pain, anorexia, nausea, vomiting, diarrhea  or change in bowel habits or change in bladder habits, change in stools or change in urine, dysuria, hematuria,  rash, arthralgias, visual complaints, headache, numbness, weakness or ataxia or problems with walking or coordination,  change in mood or  memory.        Current Meds  Medication Sig   Acetylcarnitine HCl (ACETYL L-CARNITINE) 500 MG CAPS Take by mouth daily.   acidophilus (RISAQUAD) CAPS capsule Take 1 capsule by mouth daily.  aspirin EC 81 MG tablet TAKE 1 TABLET(81 MG) BY MOUTH DAILY   B Complex-Folic Acid (B COMPLEX PLUS PO) 1 tablet   benzonatate (TESSALON) 200 MG capsule TAKE 1 CAPSULE(200 MG) BY MOUTH THREE TIMES DAILY AS NEEDED FOR COUGH   Biotin 5000 MCG TABS Take 10,000 mcg by mouth every evening.   buPROPion (WELLBUTRIN XL) 300 MG 24 hr tablet Take 300 mg by mouth daily.   Calcium Carbonate (CALCIUM 600 PO) Take by mouth daily.   cefdinir (OMNICEF) 300 MG capsule Take 1 capsule (300 mg total) by mouth 2 (two) times daily.   chlorpheniramine (CHLOR-TRIMETON) 4 MG tablet Take 8 mg by mouth 2 (two) times a day.    Cholecalciferol (VITAMIN D-3) 125 MCG (5000 UT) TABS Take by mouth daily.   Cyanocobalamin (B-12 PO) Take 5,000 mcg by mouth daily.    denosumab (PROLIA) 60 MG/ML SOLN injection Inject 60 mg into the skin every 6 (six) months.    FLUoxetine (PROZAC) 20 MG capsule Take 20 mg by mouth daily.   gabapentin (NEURONTIN) 100 MG capsule Take 1 capsule (100 mg total) by mouth 3 (three) times  daily.   losartan (COZAAR) 50 MG tablet Take 50 mg by mouth daily.    MAGNESIUM GLYCINATE PO Take by mouth.   methocarbamol (ROBAXIN) 500 MG tablet Take 500 mg by mouth every 8 (eight) hours as needed.   montelukast (SINGULAIR) 10 MG tablet TAKE 1 TABLET BY MOUTH EVERY DAY   Multiple Vitamins-Minerals (CENTRUM SILVER PO) Take 1 tablet by mouth daily.   neomycin-polymyxin-hydrocortisone (CORTISPORIN) 3.5-10000-1 OTIC suspension Apply 1-2 drops daily after soaking and cover with bandaid   pantoprazole (PROTONIX) 40 MG tablet Take 1 tablet (40 mg total) by mouth 2 (two) times daily. Take 30 TO 60 MINUTES BEFORE FIRST and LAST MEAL OF THE DAY.   Potassium 99 MG TABS Take by mouth.   rosuvastatin (CRESTOR) 10 MG tablet Take 10 mg by mouth every evening.    SYMBICORT 80-4.5 MCG/ACT inhaler Inhale 2 puffs into the lungs 2 (two) times daily. (Patient taking differently: Inhale 2 puffs into the lungs 2 (two) times daily as needed (for respiratory flares).)   Theanine 50 MG TBDP Take by mouth.   traMADol (ULTRAM) 50 MG tablet Take 50 mg by mouth 3 (three) times daily as needed.   Turmeric (QC TUMERIC COMPLEX PO) Take 1,400 mg by mouth daily.   valACYclovir (VALTREX) 1000 MG tablet Take 1,000 mg by mouth daily as needed.                      Objective:  Physical Exam  Wts  08/22/2022       184  05/16/2022     183  02/21/2022       188 03/22/2021     183  11/14/2019      184 03/25/2019     188  12/12/2018         188  05/28/2014      208 >  06/11/14  206 >   06/30/2014 207  > 10/18/2018  187           05/15/14 207 lb (93.895 kg)  04/29/13 193 lb (87.544 kg)  09/08/10 195 lb (88.451 kg)       Vital signs reviewed  08/22/2022  - Note at rest 02 sats  95% on RA   General appearance:    ***      R BKA prosthesis***  Assessment & Plan:

## 2022-08-23 ENCOUNTER — Encounter: Payer: Self-pay | Admitting: Internal Medicine

## 2022-08-23 NOTE — Assessment & Plan Note (Addendum)
Onset Aug 2015 sporadic, severe, not seasonal/ and throat clearing ever since  - allergy profile 05/28/2014 > No eos, IgE 424 POS RAST only dust and mold  -  Sinus/chest  CT 06/03/2014 > wnl  - Trial of singulair 06/30/2014 >>>  ? Benefit  - 2019 proved intolerant of ACEi  - 10/18/2018  After extensive coaching inhaler device,  effectiveness =  75%> try dulera 100 2bid and 1st gen H1 blockers per guidelines    no mints > 100% resolution s gerd rx needed  - 05/10/2019 recurrence on symb 80/off ppi p [place] [employer] > restart with just tessalon prn  - recurred late may 2020 while maint on  pantoprarzole / h1 bid and singulair - 01/20/20 started back on symb 80 2bid as maint x one month  > only took a week and "all better"  - 03/05/2020 reported cough /throat clearing stopped after one week of symbicort (but also on tramadol), continued ppi bid and remained cough free so rec if cough recurs take symb 80 2bid s tramadol and continue ppi bid/ diet x 3 months then ov  - 05/16/2022  gabapentin 100 mg tid > ? Seemed to help but did not maintain - 08/22/22 cough flared again - off gabapentin  - 08/22/2022   FENO  = 19 on symb 80 2bid   Since assoc with min wheeze will try depomedrol 120 mg IM and continue symbicort 80 with just tessilon 200 prn but if not responding consider titrating up gabapentin until resolves and then consider some baseline dose of gabapentin to stop recurrent cyclical coughing         Each maintenance medication was reviewed in detail including emphasizing most importantly the difference between maintenance and prns and under what circumstances the prns are to be triggered using an action plan format where appropriate.  Total time for H and P, chart review, counseling, reviewing hfa  device(s) and generating customized AVS unique to this office visit / same day charting = 22 min

## 2022-09-08 ENCOUNTER — Ambulatory Visit
Admission: RE | Admit: 2022-09-08 | Discharge: 2022-09-08 | Disposition: A | Discharge status: Home / Self Care | Payer: Medicare Other | Source: Ambulatory Visit | Attending: Family Medicine | Admitting: Family Medicine

## 2022-09-08 DIAGNOSIS — Z780 Asymptomatic menopausal state: Secondary | ICD-10-CM | POA: Diagnosis not present

## 2022-09-08 DIAGNOSIS — M810 Age-related osteoporosis without current pathological fracture: Secondary | ICD-10-CM

## 2022-09-09 ENCOUNTER — Telehealth: Payer: Self-pay | Admitting: Orthopedic Surgery

## 2022-09-09 NOTE — Telephone Encounter (Signed)
Signed handicap parking application mailed to patient-per her request.

## 2022-09-15 ENCOUNTER — Ambulatory Visit (INDEPENDENT_AMBULATORY_CARE_PROVIDER_SITE_OTHER): Payer: Medicare Other | Admitting: Orthopedic Surgery

## 2022-09-15 DIAGNOSIS — Z89511 Acquired absence of right leg below knee: Secondary | ICD-10-CM | POA: Diagnosis not present

## 2022-09-16 ENCOUNTER — Encounter: Payer: Self-pay | Admitting: Orthopedic Surgery

## 2022-09-16 NOTE — Progress Notes (Signed)
Office Visit Note   Patient: Shelly [name]           Date of Birth: 1950/11/08           MRN: [id number] Visit Date: 09/15/2022              Requested by: [name], MD [phone] [address] [place],  [place] [address] PCP: [name], MD  Chief Complaint  Patient presents with   Right Leg - Follow-up    Hx right BKA      HPI: Patient is a 72 year old woman who presents with end bearing pain in the right transtibial amputation with a poorly fitting socket.  Assessment & Plan: Visit Diagnoses:  1. History of right below knee amputation     Plan: A under liner was placed beneath her liner this did improve her symptoms.  Patient will need a new liner and socket.  Follow-Up Instructions: Return if symptoms worsen or fail to improve.   Ortho Exam  Patient is alert, oriented, no adenopathy, well-dressed, normal affect, normal respiratory effort. Examination patient has callus on the inferior pole of patella as well as medial and lateral plateau.  There are no open ulcers.  Patient is an existing right transtibial  amputee.  Patient's current comorbidities are not expected to impact the ability to function with the prescribed prosthesis. Patient verbally communicates a strong desire to use a prosthesis. Patient currently requires mobility aids to ambulate without a prosthesis.  Expects not to use mobility aids with a new prosthesis.  Patient is a K3 level ambulator that spends a lot of time walking around on uneven terrain over obstacles, up and down stairs, and ambulates with a variable cadence.     Imaging: No results found. No images are attached to the encounter.  Labs: Lab Results  Component Value Date   ESRSEDRATE 2 03/20/2017   ESRSEDRATE 10 09/08/2010     Lab Results  Component Value Date   ALBUMIN 3.2 (L) 01/11/2019    Lab Results  Component Value Date   MG 1.7 02/12/2015   No results found for: "VD25OH"  No results found for:  "PREALBUMIN"    Latest Ref Rng & Units 01/11/2019    2:32 PM 01/02/2019    1:55 PM 12/11/2017   12:19 PM  CBC EXTENDED  WBC 4.0 - 10.5 K/uL 10.3  8.7    RBC 3.87 - 5.11 MIL/uL 3.47  3.91    Hemoglobin 12.0 - 15.0 g/dL 78.2  95.6  21.3   HCT 36.0 - 46.0 % 34.0  37.8  39.0   Platelets 150 - 400 K/uL 309  308    NEUT# 1.7 - 7.7 K/uL 7.0     Lymph# 0.7 - 4.0 K/uL 2.1        There is no height or weight on file to calculate BMI.  Orders:  No orders of the defined types were placed in this encounter.  No orders of the defined types were placed in this encounter.    Procedures: No procedures performed  Clinical Data: No additional findings.  ROS:  All other systems negative, except as noted in the HPI. Review of Systems  Objective: Vital Signs: LMP  (LMP Unknown)   Specialty Comments:  No specialty comments available.  PMFS History: Patient Active Problem List   Diagnosis Date Noted   Anxiety 05/19/2022   Acute bronchitis 04/11/2022   GERD (gastroesophageal reflux disease) 02/22/2022   Community acquired pneumonia 09/16/2020  Upper airway cough syndrome 03/26/2019   Chronic pain 01/10/2019   MCI (mild cognitive impairment) 03/20/2017   Gait abnormality 03/20/2017   History of right below knee amputation 07/11/2016   Adenomatous colon polyp 05/10/2016   Coronary artery calcification 01/07/2016   Essential hypertension 01/07/2016   Back pain 09/09/2015   Abnormal CXR 05/16/2014   Cough variant asthma ? assoc with UACS 05/15/2014   Acute post-operative pain 02/18/2012   Adiposity 02/17/2012   Does use eyeglasses 02/13/2012   H/O arthrodesis 10/21/2011   Fibromyalgia 04/11/2011   HLD (hyperlipidemia) 04/11/2011   Avascular necrosis of talus 04/11/2011   Osteomyelitis of ankle 04/11/2011   Leg edema 09/08/2010   Past Medical History:  Diagnosis Date   Anxiety    Arthritis    Asthma    Avascular necrosis of talus    Cancer    vulva pre cancer had surgery    Depression    Gait abnormality 03/20/2017   GERD (gastroesophageal reflux disease)    Hearing loss    "very minor"   History of pneumonia    Hyperlipidemia    Hypertension    Leg edema    left   Memory difficulty 03/20/2017   PONV (postoperative nausea and vomiting)    Stress incontinence     Family History  Problem Relation Age of Onset   Dementia Mother 57       alive   Fibromyalgia Mother    Allergies Mother    Heart attack Father 30       deceased   Multiple myeloma Father    Heart murmur Sister 100       she had open heart surgery   Other Sister 41       She is bIpolar- diabetic   Breast cancer Sister    Other Brother        alive   Heart disease Brother        emergent CABG for 99% blocked CAD    Past Surgical History:  Procedure Laterality Date   ANKLE FUSION  2011   right multiple    ANKLE FUSION     rear ankle fusion   ANKLE SURGERY  2010   right cordicompression   AORTIC ARCH ANGIOGRAPHY N/A 12/11/2017   Procedure: AORTIC ARCH ANGIOGRAPHY;  Surgeon: [name], MD;  Location: [facility];  Service: Cardiovascular;  Laterality: N/A;   APPENDECTOMY  05/10/2016   BELOW KNEE LEG AMPUTATION     CATARACT EXTRACTION W/ INTRAOCULAR LENS IMPLANT Left    LAPAROSCOPIC APPENDECTOMY N/A 05/10/2016   Procedure: APPENDECTOMY LAPAROSCOPIC;  Surgeon: [name], MD;  Location: [facility];  Service: General;  Laterality: N/A;   LUMBAR LAMINECTOMY/DECOMPRESSION MICRODISCECTOMY N/A 09/09/2015   Procedure:  L4-S1 Decompression/ Discetomy;  Surgeon: [name], MD;  Location: [facility];  Service: Orthopedics;  Laterality: N/A;   SKIN GRAFT     SPINAL CORD STIMULATOR INSERTION N/A 01/10/2019   Procedure: LUMBAR SPINAL CORD STIMULATOR INSERTION;  Surgeon: [name], MD;  Location: [facility];  Service: Orthopedics;  Laterality: N/A;  2.5 hrs   TONSILLECTOMY     TUBAL LIGATION  1983   VULVECTOMY N/A 01/28/2015   Procedure: WIDE EXCISION VULVECTOMY;  Surgeon: [name], MD;  Location: [facility];  Service: Gynecology;  Laterality: N/A;   Social History   Occupational History   Occupation: ACCOUNTING    Employer: [employer]  Tobacco Use   Smoking status: Former    Packs/day: 1.00  Years: 40.00    Additional pack years: 0.00    Total pack years: 40.00    Types: Cigarettes    Quit date: 02/05/2007    Years since quitting: 15.6   Smokeless tobacco: Never  Vaping Use   Vaping Use: Never used  Substance and Sexual Activity   Alcohol use: Yes    Alcohol/week: 0.0 standard drinks of alcohol    Comment: socially   Drug use: No   Sexual activity: Not on file

## 2022-09-22 DIAGNOSIS — F325 Major depressive disorder, single episode, in full remission: Secondary | ICD-10-CM | POA: Diagnosis not present

## 2022-09-22 DIAGNOSIS — R635 Abnormal weight gain: Secondary | ICD-10-CM | POA: Diagnosis not present

## 2022-09-22 DIAGNOSIS — R4189 Other symptoms and signs involving cognitive functions and awareness: Secondary | ICD-10-CM | POA: Diagnosis not present

## 2022-09-22 DIAGNOSIS — Z89511 Acquired absence of right leg below knee: Secondary | ICD-10-CM | POA: Diagnosis not present

## 2022-09-22 DIAGNOSIS — I10 Essential (primary) hypertension: Secondary | ICD-10-CM | POA: Diagnosis not present

## 2022-09-22 DIAGNOSIS — Z85828 Personal history of other malignant neoplasm of skin: Secondary | ICD-10-CM | POA: Diagnosis not present

## 2022-09-22 DIAGNOSIS — I700 Atherosclerosis of aorta: Secondary | ICD-10-CM | POA: Diagnosis not present

## 2022-09-22 DIAGNOSIS — D0439 Carcinoma in situ of skin of other parts of face: Secondary | ICD-10-CM | POA: Diagnosis not present

## 2022-09-22 DIAGNOSIS — H9190 Unspecified hearing loss, unspecified ear: Secondary | ICD-10-CM | POA: Diagnosis not present

## 2022-09-22 DIAGNOSIS — J45991 Cough variant asthma: Secondary | ICD-10-CM | POA: Diagnosis not present

## 2022-10-05 NOTE — Progress Notes (Unsigned)
Subjective:   Patient ID: Shelly [name], female    DOB: 1950/07/20       MRN: [id number]    Brief patient profile:  54 yowf mother of optimetrist  quit smoking 2008  watery eyes in her 20s year round ? Worse in winter eventually started on zyrtec 2013 and seemed to help s assoc cough/ wheeze then new onset cough Aug 2015 and proved refractory so referred to pulmonary clinic 05/15/14  by Dr Severiano Gilbert    History of Present Illness  05/15/2014 1st [facility] office visit/ Yiannis Tulloch   Chief Complaint  Patient presents with   Pulmonary Consult    Referred by Dr. [name]. Pt c/o cough since August 2015- cough is non prod and somtimes seems worse at night. She also c/o DOE with walking up stairs.    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   Prednisone 10 mg take  4 each am x 2 days,   2 each am x 2 days,  1 each am x 2 days and stop (this is to eliminate allergies and inflammation from coughing) Omeprazole 40 mg  Take 30-60 min before first meal of the day and Pepcid 20 mg one bedtime plus chlorpheniramine 4 mg x 2 at bedtime  GERD diet reviewed     05/28/2014 f/u ov/Russell Quinney re: cough since aug 2015 Chief Complaint  Patient presents with   Acute Visit    Pt states that after last visit her cough had pretty much resolved, but then started back about 1 wk ago.  Cough is non prod and "comes and goes".   95% better, still urge to clear  throat daytime on above rx/ confused about when to d/c what  No noct cough  at all  Sob doe x steps better Then gradually worse x one week , again dry and variable   Kouffman Reflux v Neurogenic Cough Differentiator Reflux Comments  Do you awaken from a sound sleep coughing violently?                            With trouble breathing? Not now   Do you have choking episodes when you cannot  Get enough air, gasping for air ?              Not now   Do you usually cough when you lie down into  The bed, or when you just lie down to rest ?                          Not now    Do you usually cough after meals or eating?         No   Do you cough when (or after) you bend over?    Still some   GERD  SCORE     Kouffman Reflux v Neurogenic Cough Differentiator Neurogenic   Do you more-or-less cough all day long? no   Does change of temperature make you cough? no   Does laughing or chuckling cause you to cough? Still some   Do fumes (perfume, automobile fumes, burned  Toast, etc.,) cause you to cough ?      no   Does speaking, singing, or talking on the phone cause you to cough   ?               Not now   Neurogenic/Airway score       rec Prednisone 10 mg take  4 each am x 2 days,   2 each am x 2 days,  1 each am x 2 days and stop  Take Tessalon 4x daily  and supplement if needed with  tramadol 50 mg up to 2 every 4 hours to suppress the urge to cough. Swallowing water or using ice chips/non mint and menthol containing candies (such as lifesavers or sugarless jolly ranchers) are also effective.  You should rest your voice and avoid activities that you know make you cough. Once you have eliminated the cough for 3 straight days try reducing the tramadol first,  then the tessalon as tolerated Please see patient coordinator before you leave today  to schedule sinus and chest CT and fluoro > neg       10/18/2018   Pulmonary/ re-estabish/Devonte Migues re:  Tendency to cough sporadic pattern x years, recurrent  since March 2020 while on singulair  Chief Complaint  Patient presents with   Pulmonary Consult    Self referral.  Pt states has had increased cough x 2 months.  Cough is currently non prod. She has not had to use her proair in the past wk.     was maint on singulair,  h1 x 2 hs and no gerd rx then acutely worse x 2 months s obvious trigger Typically year round pattern is that every few weeks the cough starts back up  Dyspnea:  MMRC1 = even on best edays  can walk nl pace, flat grade, can't hurry or go uphills or steps s sob   Cough: mostly dry / gag ? Sometime vomit when cough is severe and "almost pass out"  Sleeping: bed is flat / cough was bad at night better now  SABA use: saba helped as does prednisone  02: 02  Using lots of mints  acei intol ? When  d/c'd (started summer 2019)  rec Plan A = Automatic =Dulera 100 Take 2 puffs first thing in am and then another 2 puffs about 12 hours later and continue singulair 10 mg each pm  Work on inhaler technique:   Plan B = Backup Only use your albuterol inhaler as a rescue medication  Whenever cough start  prilosec 20 mg Take 30- 60 min before your first and last meals of the day until no longer coughing and  For drainage / throat tickle try take CHLORPHENIRAMINE  4 mg  (Chlortab 4mg   at [employer] should be easiest to find in the green box)  take one every 4 hours as needed - available over the counter- may cause drowsiness so start with just a bedtime dose or two and see how you tolerate it before trying in daytime          05/16/2022  f/u ov/Makina Skow re: cough x 2015  maint on Protonix  / s/p "sick  cough" rx with omnicef and resolved  Chief Complaint  Patient presents with   Follow-up    Cough much improved today.  Pt c/o coughing x 4 weeks  Has been coughing daily x 2015 / worse with laughter/ better with tessalon  Dyspnea:  Not limited by breathing from desired activities   Cough: minimal mucoid Sleeping: flat bed one pillow   SABA use: occ symbicort 80 02: none  Covid status:   ? How many vax , infected x one Rec Pantoprazole 40 mg should be Take 30- 60 min before your first and last meals of the day  Gabapentin 100 mg three times a day  Continue chlorpheniramine as needed    08/22/2022  f/u  ov/Camari Wisham re: recurrent cough flared on 08/18/22 while on max ppi  and tessalon  but not gabapentin  and started up symbicort 80 w/in 24h  of cough Chief Complaint  Patient presents with   Follow-up    Had been doing well.  Wheeze and cough x 3 days.  Dyspnea: Not limited by breathing from desired activities   Cough: hacking clear mucus worse as day goes on  Sleeping: flat bed one pillow SABA use: none   Rec Depomedrol  120 mg IM  today  Refill tessalon 200 mg every 6-8 hours as needed- stop it when better    10/06/2022  f/u ov/Dayzha Pogosyan re: ***   maint on ***  No chief complaint on file.   Dyspnea:  *** Cough: *** Sleeping: *** SABA use: *** 02: *** Covid status:   *** Lung cancer screening :  ***    No obvious day to day or daytime variability or assoc excess/ purulent sputum or mucus plugs or hemoptysis or cp or chest tightness, subjective wheeze or overt sinus or hb symptoms.   *** without nocturnal  or early am exacerbation  of respiratory  c/o's or need for noct saba. Also denies any obvious fluctuation of symptoms with weather or environmental changes or other aggravating or alleviating factors except as outlined above   No unusual exposure hx or h/o childhood pna/ asthma or knowledge of premature birth.  Current Allergies, Complete Past Medical History, Past Surgical History, Family History, and Social History were reviewed in [employer] record.  ROS  The following are not active complaints unless bolded Hoarseness, sore throat, dysphagia, dental problems, itching, sneezing,  nasal congestion or discharge of excess mucus or purulent secretions, ear ache,   fever, chills, sweats,  unintended wt loss or wt gain, classically pleuritic or exertional cp,  orthopnea pnd or arm/hand swelling  or leg swelling, presyncope, palpitations, abdominal pain, anorexia, nausea, vomiting, diarrhea  or change in bowel habits or change in bladder habits, change in stools or change in urine, dysuria, hematuria,  rash, arthralgias, visual complaints, headache, numbness, weakness or ataxia or problems with walking or coordination,  change in mood or  memory.        No outpatient medications have been marked as taking for the 10/06/22 encounter (Appointment) with Nyoka Cowden, MD.                Objective:  Physical Exam  Wts  10/06/2022         ***  08/22/2022       184  05/16/2022     183  02/21/2022       188 03/22/2021     183  11/14/2019      184 03/25/2019  188  12/12/2018         188  05/28/2014      208 >  06/11/14  206 >   06/30/2014 207  > 10/18/2018  187           05/15/14 207 lb (93.895 kg)  04/29/13 193 lb (87.544 kg)  09/08/10 195 lb (88.451 kg)       Vital signs reviewed  10/06/2022  - Note at rest 02 sats  ***% on ***   General appearance:    ***    R BKA prosthesis ***            Assessment & Plan:

## 2022-10-06 ENCOUNTER — Ambulatory Visit (INDEPENDENT_AMBULATORY_CARE_PROVIDER_SITE_OTHER): Payer: Medicare Other | Admitting: Internal Medicine

## 2022-10-06 ENCOUNTER — Encounter: Payer: Self-pay | Admitting: Internal Medicine

## 2022-10-06 VITALS — BP 122/78 | HR 104 | Temp 98.2°F | Ht 66.0 in | Wt 182.6 lb

## 2022-10-06 DIAGNOSIS — J45991 Cough variant asthma: Secondary | ICD-10-CM

## 2022-10-06 MED ORDER — BUDESONIDE-FORMOTEROL FUMARATE 80-4.5 MCG/ACT IN AERO: INHALATION_SPRAY | RESPIRATORY_TRACT | 12 refills | Status: DC

## 2022-10-06 NOTE — Assessment & Plan Note (Signed)
Onset Aug 2015 sporadic, severe, not seasonal/ and throat clearing ever since  - allergy profile 05/28/2014 > No eos, IgE 424 POS RAST only dust and mold  -  Sinus/chest  CT 06/03/2014 > wnl  - Trial of singulair 06/30/2014 >>>  ? Benefit  - 2019 proved intolerant of ACEi  - 10/18/2018  After extensive coaching inhaler device,  effectiveness =  75%> try dulera 100 2bid and 1st gen H1 blockers per guidelines    no mints > 100% resolution s gerd rx needed  - 05/10/2019 recurrence on symb 80/off ppi p [place] food > restart with just tessalon prn  - recurred late may 2020 while maint on  pantoprarzole / h1 bid and singulair - 01/20/20 started back on symb 80 2bid as maint x one month  > only took a week and "all better"  - 03/05/2020 reported cough /throat clearing stopped after one week of symbicort (but also on tramadol), continued ppi bid and remained cough free so rec if cough recurs take symb 80 2bid s tramadol and continue ppi bid/ diet x 3 months then ov  - 05/16/2022  gabapentin 100 mg tid > ? Seemed to help but did not maintain - 08/22/22 cough flared again   off gabapentin > resolved to her satisfaction 10/06/2022   Appears to be managing well on ppi bid and singulair and symb 80 2bid s cough suppression or gabapentin but understands the concept of tessalon 200 for short term and gabapentin for longer term control should either be needed again, which I think is quite likely   Rec  Continue symbicort 80 up to 2 bid  prn Based on two studies from NEJM  378; 20 p 1865 (2018) and 380 : p2020-30 (2019) in pts with mild asthma it is reasonable to use low dose symbicort eg 80 2bid "prn" flare in this setting but I emphasized this was only shown with symbicort and takes advantage of the rapid onset of action but is not the same as "rescue therapy" but can be stopped once the acute symptoms have resolved and the need for rescue has been minimized (< 2 x weekly)           Each maintenance medication was  reviewed in detail including emphasizing most importantly the difference between maintenance and prns and under what circumstances the prns are to be triggered using an action plan format where appropriate.  Total time for H and P, chart review, counseling, reviewing hfa device(s) and generating customized AVS unique to this office visit / same day charting > 30 min for   respiratory  symptoms of uncertain etiology

## 2022-10-06 NOTE — Patient Instructions (Addendum)
Symbicort 80 = Dulera 100 interchangeable  and both are used up to 2 every 12h    Please schedule a follow up visit in 6 months but call sooner if needed

## 2022-10-18 ENCOUNTER — Other Ambulatory Visit: Payer: Self-pay

## 2022-10-18 MED ORDER — MONTELUKAST SODIUM 10 MG PO TABS: 10.0000 mg | ORAL_TABLET | Freq: Every day | ORAL | 1 refills | Status: DC

## 2022-10-18 NOTE — Telephone Encounter (Signed)
Received fax request for refill on montelukast 10 mg.  Last OV 10/06/2022.  Per chart note: 10/06/2022  f/u ov/Wert re: cough x 2015   maint on symbicort 80 and singulair / ppi bid  OK refill montelukast 10 mg 90 days supply with 1 refill.  [facility] [address]in[place], [place]

## 2022-10-26 ENCOUNTER — Telehealth: Payer: Self-pay | Admitting: Internal Medicine

## 2022-10-26 ENCOUNTER — Other Ambulatory Visit: Payer: Self-pay | Admitting: Internal Medicine

## 2022-10-26 ENCOUNTER — Other Ambulatory Visit: Payer: Self-pay

## 2022-10-26 MED ORDER — CEFDINIR 300 MG PO CAPS: 300.0000 mg | ORAL_CAPSULE | Freq: Two times a day (BID) | ORAL | 0 refills | Status: DC

## 2022-10-26 NOTE — Telephone Encounter (Signed)
Spoke with patient. She is requesting a refill of Cefdinir. She states she is feeling great but is going on vacation next week and wants to have prescription on hand- she worries she could start coughing again.  Dr. Sherene Sires please advise?  Pharmacy [facility] on [address]

## 2022-10-26 NOTE — Telephone Encounter (Signed)
Patient called to inform the nurse or doctor that the pharmacy denied her refill for Cedinir.  She stated that she needs refill for this medication because she only has 1 pill left and is going out of town.  Please advise and call patient to confirm.  CB# [phone]

## 2022-10-26 NOTE — Telephone Encounter (Signed)
Spoke with patient. Advised Cefdinir has been sent to pharmacy. NFN

## 2022-10-27 ENCOUNTER — Ambulatory Visit (INDEPENDENT_AMBULATORY_CARE_PROVIDER_SITE_OTHER): Payer: Medicare Other | Admitting: Podiatry

## 2022-10-27 DIAGNOSIS — M79675 Pain in left toe(s): Secondary | ICD-10-CM | POA: Diagnosis not present

## 2022-10-27 DIAGNOSIS — B351 Tinea unguium: Secondary | ICD-10-CM

## 2022-10-27 DIAGNOSIS — M79674 Pain in right toe(s): Secondary | ICD-10-CM | POA: Diagnosis not present

## 2022-10-27 NOTE — Progress Notes (Signed)
  Subjective:  Patient ID: Shelly [name], female    DOB: 11-29-1950,  MRN: [id number]  Chief Complaint  Patient presents with   Nail Problem    Rm 22 RFC Nail trim of left foot.     72 y.o. female presents with the above complaint. History confirmed with patient.  Remaining nails are thick and elongated causing discomfort  Objective:  Physical Exam: warm, good capillary refill, no trophic changes or ulcerative lesions, normal DP and PT pulses, and normal sensory exam. Left Foot: Thickened elongated toenails with subungual debris and discoloration  Radiographs: Multiple views x-ray of the left foot: No osseous abnormalities in the area of concern there is prominence of the cuboid and fifth met base     Assessment:   1. Pain due to onychomycosis of toenail of left foot       Plan:  Patient was evaluated and treated and all questions answered.  She will continue the [place] for the lesser toenails.  Recommended debridement of the nails today. Sharp and mechanical debridement performed of all painful and mycotic nails today. Nails debrided in length and thickness using a nail nipper to level of comfort.      Return in about 3 months (around 01/27/2023) for painful thick fungal nails.

## 2022-11-11 ENCOUNTER — Ambulatory Visit
Admission: RE | Admit: 2022-11-11 | Discharge: 2022-11-11 | Disposition: A | Discharge status: Home / Self Care | Payer: Medicare Other | Source: Ambulatory Visit | Attending: Family Medicine | Admitting: Family Medicine

## 2022-11-11 ENCOUNTER — Other Ambulatory Visit: Payer: Self-pay | Admitting: Family Medicine

## 2022-11-11 DIAGNOSIS — N63 Unspecified lump in unspecified breast: Secondary | ICD-10-CM | POA: Diagnosis not present

## 2022-11-11 DIAGNOSIS — N6320 Unspecified lump in the left breast, unspecified quadrant: Secondary | ICD-10-CM

## 2022-11-11 DIAGNOSIS — N6322 Unspecified lump in the left breast, upper inner quadrant: Secondary | ICD-10-CM | POA: Diagnosis not present

## 2022-11-11 DIAGNOSIS — N6324 Unspecified lump in the left breast, lower inner quadrant: Secondary | ICD-10-CM | POA: Diagnosis not present

## 2022-12-01 ENCOUNTER — Other Ambulatory Visit: Payer: Medicare Other

## 2022-12-02 ENCOUNTER — Other Ambulatory Visit: Payer: Medicare Other

## 2022-12-23 ENCOUNTER — Encounter: Payer: Self-pay | Admitting: Emergency Medicine

## 2022-12-23 ENCOUNTER — Ambulatory Visit (INDEPENDENT_AMBULATORY_CARE_PROVIDER_SITE_OTHER): Payer: Medicare Other | Admitting: Emergency Medicine

## 2022-12-23 VITALS — BP 126/74 | HR 92 | Temp 97.9°F | Ht 65.0 in | Wt 175.4 lb

## 2022-12-23 DIAGNOSIS — K219 Gastro-esophageal reflux disease without esophagitis: Secondary | ICD-10-CM

## 2022-12-23 DIAGNOSIS — G8929 Other chronic pain: Secondary | ICD-10-CM | POA: Diagnosis not present

## 2022-12-23 DIAGNOSIS — M5412 Radiculopathy, cervical region: Secondary | ICD-10-CM | POA: Diagnosis not present

## 2022-12-23 DIAGNOSIS — M5136 Other intervertebral disc degeneration, lumbar region: Secondary | ICD-10-CM | POA: Diagnosis not present

## 2022-12-23 DIAGNOSIS — J45991 Cough variant asthma: Secondary | ICD-10-CM

## 2022-12-23 DIAGNOSIS — M47816 Spondylosis without myelopathy or radiculopathy, lumbar region: Secondary | ICD-10-CM | POA: Diagnosis not present

## 2022-12-23 DIAGNOSIS — J209 Acute bronchitis, unspecified: Secondary | ICD-10-CM

## 2022-12-23 DIAGNOSIS — M545 Low back pain, unspecified: Secondary | ICD-10-CM | POA: Diagnosis not present

## 2022-12-23 MED ORDER — CEFDINIR 300 MG PO CAPS: 300.0000 mg | ORAL_CAPSULE | Freq: Two times a day (BID) | ORAL | 0 refills | Status: DC

## 2022-12-23 NOTE — Addendum Note (Signed)
Addended by: Dorisann Frames R on: 12/23/2022 10:40 AM   Modules accepted: Orders

## 2022-12-23 NOTE — Progress Notes (Signed)
Subjective:    Patient ID: Shelly [name], female    DOB: 09-21-1950, 72 y.o.   MRN: [id number]  HPI Acute OV 12/23/22 -- 57 F followed by Dr Sherene Sires. She has chronic cough vs cough variant asthma. I do not see any PFT or spiro.  On singulair, symbicort, protonix bid, chlortabs. She is using mucinex for the last 2 weeks for her baseline cough She was around someone with a URI for the last week, then she began to have cough 2 days ago with rhinitis. No change in her baseline exertional SOB or wheeze which occurs. She has some central chest pressure that happens occasionally, has been present for months. Her mucous has turned darker, green.     Review of Systems As  per HPI  Past Medical History:  Diagnosis Date   Anxiety    Arthritis    Asthma    Avascular necrosis of talus (HCC)    Cancer (HCC)    vulva pre cancer had surgery   Depression    Gait abnormality 03/20/2017   GERD (gastroesophageal reflux disease)    Hearing loss    "very minor"   History of pneumonia    Hyperlipidemia    Hypertension    Leg edema    left   Memory difficulty 03/20/2017   PONV (postoperative nausea and vomiting)    Stress incontinence      Family History  Problem Relation Age of Onset   Dementia Mother 39       alive   Fibromyalgia Mother    Allergies Mother    Heart attack Father 24       deceased   Multiple myeloma Father    Heart murmur Sister 52       she had open heart surgery   Other Sister 20       She is bIpolar- diabetic   Breast cancer Sister    Other Brother        alive   Heart disease Brother        emergent CABG for 99% blocked CAD     Social History   Socioeconomic History   Marital status: Divorced    Spouse name: Not on file   Number of children: 3   Years of education: 14   Highest education level: Not on file  Occupational History   Occupation: ACCOUNTING    Employer: [employer]  Tobacco Use   Smoking status: Former    Current packs/day: 0.00     Average packs/day: 1 pack/day for 40.0 years (40.0 ttl pk-yrs)    Types: Cigarettes    Start date: 02/05/1967    Quit date: 02/05/2007    Years since quitting: 15.8   Smokeless tobacco: Never  Vaping Use   Vaping status: Never Used  Substance and Sexual Activity   Alcohol use: Yes    Alcohol/week: 0.0 standard drinks of alcohol    Comment: socially   Drug use: No   Sexual activity: Not on file  Other Topics Concern   Not on file  Social History Narrative   Lives alone   Caffeine use: Tea every morning    Right handed    Social Determinants of Health   Financial Resource Strain: Not on file  Food Insecurity: Not on file  Transportation Needs: Not on file  Physical Activity: Not on file  Stress: Not on file  Social Connections: Not on file  Intimate Partner Violence: Not on file  Allergies  Allergen Reactions   Dextromethorphan     Other Reaction(s): cognitive impairment     Outpatient Medications Prior to Visit  Medication Sig Dispense Refill   Acetylcarnitine HCl (ACETYL L-CARNITINE) 500 MG CAPS Take by mouth daily.     acidophilus (RISAQUAD) CAPS capsule Take 1 capsule by mouth daily.     aspirin EC 81 MG tablet TAKE 1 TABLET(81 MG) BY MOUTH DAILY 90 tablet 3   B Complex-Folic Acid (B COMPLEX PLUS PO) 1 tablet     benzonatate (TESSALON) 200 MG capsule TAKE 1 CAPSULE(200 MG) BY MOUTH THREE TIMES DAILY AS NEEDED FOR COUGH 90 capsule 2   benzonatate (TESSALON) 200 MG capsule Take 1 capsule (200 mg total) by mouth 3 (three) times daily as needed for cough. 90 capsule 1   Biotin 5000 MCG TABS Take 10,000 mcg by mouth every evening.     budesonide-formoterol (SYMBICORT) 80-4.5 MCG/ACT inhaler Take 2 puffs first thing in am and then another 2 puffs about 12 hours later. 1 each 12   buPROPion (WELLBUTRIN XL) 300 MG 24 hr tablet Take 300 mg by mouth daily.     Calcium Carbonate (CALCIUM 600 PO) Take by mouth daily.     cefdinir (OMNICEF) 300 MG capsule Take 1 capsule (300 mg  total) by mouth 2 (two) times daily. 20 capsule 0   chlorpheniramine (CHLOR-TRIMETON) 4 MG tablet Take 8 mg by mouth 2 (two) times a day.      Cholecalciferol (VITAMIN D-3) 125 MCG (5000 UT) TABS Take by mouth daily.     Cyanocobalamin (B-12 PO) Take 5,000 mcg by mouth daily.      denosumab (PROLIA) 60 MG/ML SOLN injection Inject 60 mg into the skin every 6 (six) months.      FLUoxetine (PROZAC) 20 MG capsule Take 20 mg by mouth daily.     gabapentin (NEURONTIN) 100 MG capsule Take 1 capsule (100 mg total) by mouth 3 (three) times daily. 90 capsule 3   losartan (COZAAR) 50 MG tablet Take 50 mg by mouth daily.      MAGNESIUM GLYCINATE PO Take by mouth.     methocarbamol (ROBAXIN) 500 MG tablet Take 500 mg by mouth every 8 (eight) hours as needed.     montelukast (SINGULAIR) 10 MG tablet Take 1 tablet (10 mg total) by mouth daily. 90 tablet 1   Multiple Vitamins-Minerals (CENTRUM SILVER PO) Take 1 tablet by mouth daily.     neomycin-polymyxin-hydrocortisone (CORTISPORIN) 3.5-10000-1 OTIC suspension Apply 1-2 drops daily after soaking and cover with bandaid 10 mL 0   pantoprazole (PROTONIX) 40 MG tablet Take 1 tablet (40 mg total) by mouth 2 (two) times daily. Take 30 TO 60 MINUTES BEFORE FIRST and LAST MEAL OF THE DAY. 180 tablet 3   Potassium 99 MG TABS Take by mouth.     rosuvastatin (CRESTOR) 10 MG tablet Take 10 mg by mouth every evening.   1   Theanine 50 MG TBDP Take by mouth.     traMADol (ULTRAM) 50 MG tablet Take 50 mg by mouth 3 (three) times daily as needed.     Turmeric (QC TUMERIC COMPLEX PO) Take 1,400 mg by mouth daily.     valACYclovir (VALTREX) 1000 MG tablet Take 1,000 mg by mouth daily as needed.      No facility-administered medications prior to visit.         Objective:   Physical Exam Vitals:   12/23/22 1018  BP: 126/74  Pulse: 92  Temp: 97.9 F (36.6 C)  TempSrc: Oral  SpO2: 94%  Weight: 175 lb 6.4 oz (79.6 kg)  Height: 5\' 5"  (1.651 m)    Gen: Pleasant,  well-nourished, in no distress,  normal affect  ENT: No lesions,  mouth clear,  oropharynx clear, no postnasal drip  Neck: No JVD, no stridor  Lungs: No use of accessory muscles, scattered rhonchi but no wheeze  Cardiovascular: RRR, heart sounds normal, no murmur or gallops, no peripheral edema  Musculoskeletal: RLE amputation  Neuro: alert, awake, non focal  Skin: Warm, no lesions or rash      Assessment & Plan:   Acute bronchitis Acute bronchitis sx in a patient with cough varient asthma, UA instability. She doe snot have any wheeze currently. Believe we can continue her maintenance meds, avoid steroids and treat with cefdinir. She will call us if not responding.   Please take cefdinir as directed until completely gone Continue your Symbicort 2 puffs twice a day. Rinse and gargle after using Keep albuterol available to use 2 puffs up to every 4 hours if needed for shortness of breath, chest tightness, wheezing.  Continue your chlor-tabs, mucinex as you have been taking them Continue your Protonix as usual Call us next week if you are not improving Follow with Dr Sherene Sires as planned.   GERD (gastroesophageal reflux disease) Seems to be well controlled on current PPI  Cough variant asthma ? assoc with UACS Bronchitis without an over flare or bronchospasm.    Levy Pupa, MD, PhD 12/23/2022, 10:33 AM [facility] [phone] or if no answer before 7:00PM call [phone] For any issues after 7:00PM please call eLink [phone]

## 2022-12-23 NOTE — Assessment & Plan Note (Signed)
Bronchitis without an over flare or bronchospasm.

## 2022-12-23 NOTE — Assessment & Plan Note (Signed)
Seems to be well controlled on current PPI

## 2022-12-23 NOTE — Assessment & Plan Note (Signed)
Acute bronchitis sx in a patient with cough varient asthma, UA instability. She doe snot have any wheeze currently. Believe we can continue her maintenance meds, avoid steroids and treat with cefdinir. She will call us if not responding.   Please take cefdinir as directed until completely gone Continue your Symbicort 2 puffs twice a day. Rinse and gargle after using Keep albuterol available to use 2 puffs up to every 4 hours if needed for shortness of breath, chest tightness, wheezing.  Continue your chlor-tabs, mucinex as you have been taking them Continue your Protonix as usual Call us next week if you are not improving Follow with Dr Sherene Sires as planned.

## 2022-12-23 NOTE — Patient Instructions (Addendum)
Please take cefdinir as directed until completely gone Continue your Symbicort 2 puffs twice a day. Rinse and gargle after using Keep albuterol available to use 2 puffs up to every 4 hours if needed for shortness of breath, chest tightness, wheezing.  Continue your chlor-tabs, mucinex as you have been taking them Continue your Protonix as usual Call us next week if you are not improving Follow with Dr Sherene Sires as planned.

## 2023-01-11 ENCOUNTER — Ambulatory Visit: Payer: Medicare Other | Admitting: Neurology

## 2023-02-02 NOTE — Progress Notes (Addendum)
 Patient: Shelly [name] Date of Birth: 1951/02/19  Reason for Visit: Follow up for memory History from: Patient, alone Primary Neurologist: Shelly [name]  ASSESSMENT AND PLAN 72 y.o. year old female   1.  Mild cognitive impairment 2.  Anxiety, mood disorder -Does not appear to be any significant change, seems to have been very overwhelmed this summer, anxious, today MoCA 21/30 -Referral back to Shelly [name] for repeat neuropsychological testing (testing in August 2023  in summary felt consistent with mild cognitive impairment likely multifactorial contribution from sleep patterns, chronic pain, anxiety, mood) -Recommend brain stimulating exercises, consistent physical activity; good manage meant of vascular risk factors; continue follow-up with psychiatry; healthy eating; consistent sleep pattern -Send my chart note with current supplements -Follow-up 6 months or sooner if needed  Addendum 08/08/2023 SS: Received neuropsychological evaluation from 07/20/2023 from Shelly [name].  Compared to previous evaluation she has shown significant improvement in mood, anxiety, sleep quality.  Her objective memory impairment has worsened.  Clinically she now meets criteria for dementia due to possible Alzheimer's disease, mild.  We are seeing each other 08/24/2023 to review results.  HISTORY OF PRESENT ILLNESS: Today 02/07/23 PCP sent back for worsening memory. Patient laughs "she is losing her mind". MOCA 21/30. Having gaps in her memory, can't remember ingredients for well know recipe, what she was going into room for. Taking few supplements for memory (not sure what), forgot for 2 weeks, thought she was going into dementia. Her mother had dementia, she is afraid of being like her. She is living alone, driving, doing fine with this, she gave her dog away had to walk 3 times a day, was too hot. 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 Hard time remembering peoples names. She is better now than  she was 1 month ago, was very anxious at the time, due to worry for dementia. Has phobias, worries about getting "fat". Sleeping better, in bed around 7:30 PM, reading for several hours. 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 Summarizes her summer as "overwhelming" .  Update 05/19/22 SS: Doing well, MMSE 24/30 today. Did 6 weeks of counseling, was feeling overwhelmed, now feeling much better. Doesn't think she snores, no dry mouth, or apnea, no morning headache. Sleep is better, taking Nervive, helps with achy pains. Goes to bed 9 PM, 4 AM to go to work. She is a Social worker for 72 year old. After having neuropsychological evaluation, she felt reassured, feels memory issues are better. Working on anxiety. Had audiology evaluation, told needed hearing aids, too expensive. Has lost 12 lbs in the last few weeks, has changed her diet. Seeing pulmonary for lung issues.  Lives alone, drives a car.  Received neuropsychological evaluation from Shelly [name] 01/10/22, consistent with unspecified mild cognitive impairment that is likely multifactorial.  Neurodegenerative process cannot fully be ruled out, but there is likely contribution from recent fractionated sleep patterns, her chronic pain, generalized anxiety, mood issues.  Could also be a vascular component given evidence of executive dysfunction on testing and presence of vascular risk factors and her medical history. Recommended repeat neuropsychological testing in 12 to 18 months.   Recommendations -Hearing evaluation with audiologist -Better sleep habits -Sleep apnea, OSA? -Recommended psychotherapy -Staying active, brain healthy diet  09/28/21 SS: Shelly [name] here today for follow-up. MOCA 23/30. Was 25/30 at last visit. Thinks memory is declining. Her mother had dementia, she is paranoid this is happening to her. Lives alone,  does everything independently. Worries her, goes to room can't remember what she was going to do. Drives a car  well, but is getting concerned about night driving, judging distances. 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 Keeps lists on her phone for groceries. Pays her own bills. CT brain Feb 2022 showed age related changes, can't have any MRI due to spinal stimulator. Had cognitive training for speech therapy. Was helpful and gave her confidence. A lot of involvement in church. Does have anxiety is on Prozac, Wellbutrin.   HISTORY  03/02/2021 Dr. Anne Hahn: Shelly [name] is a 72 year old right-handed white female with a history of a mild memory disorder.  The patient has not given up any activities of daily living because of this.  She at times will have some difficulty remembering directions to areas that she has been before.  She manages her medications, appointments, and finances quite well.  She reports a short-term memory issue, sometimes she will walk into a room and cannot remember why she is there.  She may have some word finding difficulty, and difficulty remembering names for things.  The patient usually sleeps fairly well.  She does have some spine pain, she has a spinal stimulator in place.  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 Lajoyce Corners for this.  She has had multiple falls recently, most of them have been related to prosthetic failure or tripping over something.  She returns to the office today for further evaluation.  REVIEW OF SYSTEMS: Out of a complete 14 system review of symptoms, the patient complains only of the following symptoms, and all other reviewed systems are negative.  See HPI  ALLERGIES: Allergies  Allergen Reactions   Dextromethorphan     Other Reaction(s): cognitive impairment    HOME MEDICATIONS: Outpatient Medications Prior to Visit  Medication Sig Dispense Refill   Acetylcarnitine HCl (ACETYL L-CARNITINE) 500 MG CAPS Take by mouth daily.     acidophilus (RISAQUAD) CAPS capsule Take 1 capsule  by mouth daily.     B Complex-Folic Acid (B COMPLEX PLUS PO) 1 tablet     benzonatate (TESSALON) 200 MG capsule TAKE 1 CAPSULE(200 MG) BY MOUTH THREE TIMES DAILY AS NEEDED FOR COUGH 90 capsule 2   benzonatate (TESSALON) 200 MG capsule Take 1 capsule (200 mg total) by mouth 3 (three) times daily as needed for cough. 90 capsule 1   Biotin 5000 MCG TABS Take 10,000 mcg by mouth every evening.     budesonide-formoterol (SYMBICORT) 80-4.5 MCG/ACT inhaler Take 2 puffs first thing in am and then another 2 puffs about 12 hours later. 1 each 12   buPROPion (WELLBUTRIN XL) 300 MG 24 hr tablet Take 300 mg by mouth daily.     Calcium Carbonate (CALCIUM 600 PO) Take by mouth daily.     cefdinir (OMNICEF) 300 MG capsule Take 1 capsule (300 mg total) by mouth 2 (two) times daily. 14 capsule 0   chlorpheniramine (CHLOR-TRIMETON) 4 MG tablet Take 8 mg by mouth 2 (two) times a day.      Cholecalciferol (VITAMIN D-3) 125 MCG (5000 UT) TABS Take by mouth daily.     Cyanocobalamin (B-12 PO) Take 5,000 mcg by mouth daily.      denosumab (PROLIA) 60 MG/ML SOLN injection Inject 60 mg into the skin every 6 (six) months.      FLUoxetine (PROZAC) 20 MG capsule Take 20 mg  by mouth daily.     gabapentin (NEURONTIN) 100 MG capsule Take 1 capsule (100 mg total) by mouth 3 (three) times daily. 90 capsule 3   losartan (COZAAR) 50 MG tablet Take 50 mg by mouth daily.      MAGNESIUM GLYCINATE PO Take by mouth.     methocarbamol (ROBAXIN) 500 MG tablet Take 500 mg by mouth every 8 (eight) hours as needed.     montelukast (SINGULAIR) 10 MG tablet Take 1 tablet (10 mg total) by mouth daily. 90 tablet 1   Multiple Vitamins-Minerals (CENTRUM SILVER PO) Take 1 tablet by mouth daily.     neomycin-polymyxin-hydrocortisone (CORTISPORIN) 3.5-10000-1 OTIC suspension Apply 1-2 drops daily after soaking and cover with bandaid 10 mL 0   pantoprazole (PROTONIX) 40 MG tablet Take 1 tablet (40 mg total) by mouth 2 (two) times daily. Take 30 TO 60  MINUTES BEFORE FIRST and LAST MEAL OF THE DAY. 180 tablet 3   Potassium 99 MG TABS Take by mouth.     rosuvastatin (CRESTOR) 10 MG tablet Take 10 mg by mouth every evening.   1   Theanine 50 MG TBDP Take by mouth.     traMADol (ULTRAM) 50 MG tablet Take 50 mg by mouth 3 (three) times daily as needed.     Turmeric (QC TUMERIC COMPLEX PO) Take 1,400 mg by mouth daily.     valACYclovir (VALTREX) 1000 MG tablet Take 1,000 mg by mouth daily as needed.      aspirin EC 81 MG tablet TAKE 1 TABLET(81 MG) BY MOUTH DAILY 90 tablet 3   No facility-administered medications prior to visit.    PAST MEDICAL HISTORY: Past Medical History:  Diagnosis Date   Anxiety    Arthritis    Asthma    Avascular necrosis of talus (HCC)    Cancer (HCC)    vulva pre cancer had surgery   Depression    Gait abnormality 03/20/2017   GERD (gastroesophageal reflux disease)    Hearing loss    "very minor"   History of pneumonia    Hyperlipidemia    Hypertension    Leg edema    left   Memory difficulty 03/20/2017   PONV (postoperative nausea and vomiting)    Stress incontinence     PAST SURGICAL HISTORY: Past Surgical History:  Procedure Laterality Date   ANKLE FUSION  2011   right multiple    ANKLE FUSION     rear ankle fusion   ANKLE SURGERY  2010   right cordicompression   AORTIC ARCH ANGIOGRAPHY N/A 12/11/2017   Procedure: AORTIC ARCH ANGIOGRAPHY;  Surgeon: [name], MD;  Location: [facility];  Service: Cardiovascular;  Laterality: N/A;   APPENDECTOMY  05/10/2016   BELOW KNEE LEG AMPUTATION     CATARACT EXTRACTION W/ INTRAOCULAR LENS IMPLANT Left    LAPAROSCOPIC APPENDECTOMY N/A 05/10/2016   Procedure: APPENDECTOMY LAPAROSCOPIC;  Surgeon: [name], MD;  Location: [facility];  Service: General;  Laterality: N/A;   LUMBAR LAMINECTOMY/DECOMPRESSION MICRODISCECTOMY N/A 09/09/2015   Procedure:  L4-S1 Decompression/ Discetomy;  Surgeon: [name], MD;  Location: [facility];  Service:  Orthopedics;  Laterality: N/A;   SKIN GRAFT     SPINAL CORD STIMULATOR INSERTION N/A 01/10/2019   Procedure: LUMBAR SPINAL CORD STIMULATOR INSERTION;  Surgeon: [name], MD;  Location: [facility];  Service: Orthopedics;  Laterality: N/A;  2.5 hrs   TONSILLECTOMY     TUBAL LIGATION  1983   VULVECTOMY N/A 01/28/2015  Procedure: WIDE EXCISION VULVECTOMY;  Surgeon: [name], MD;  Location: [facility];  Service: Gynecology;  Laterality: N/A;    FAMILY HISTORY: Family History  Problem Relation Age of Onset   Dementia Mother 14       alive   Fibromyalgia Mother    Allergies Mother    Heart attack Father 58       deceased   Multiple myeloma Father    Heart murmur Sister 29       she had open heart surgery   Other Sister 24       She is bIpolar- diabetic   Breast cancer Sister    Other Brother        alive   Heart disease Brother        emergent CABG for 99% blocked CAD    SOCIAL HISTORY: Social History   Socioeconomic History   Marital status: Divorced    Spouse name: Not on file   Number of children: 3   Years of education: 14   Highest education level: Not on file  Occupational History   Occupation: ACCOUNTING    Employer: [employer]  Tobacco Use   Smoking status: Former    Current packs/day: 0.00    Average packs/day: 1 pack/day for 40.0 years (40.0 ttl pk-yrs)    Types: Cigarettes    Start date: 02/05/1967    Quit date: 02/05/2007    Years since quitting: 16.0   Smokeless tobacco: Never  Vaping Use   Vaping status: Never Used  Substance and Sexual Activity   Alcohol use: Yes    Alcohol/week: 0.0 standard drinks of alcohol    Comment: socially   Drug use: No   Sexual activity: Not on file  Other Topics Concern   Not on file  Social History Narrative   Lives alone   Caffeine use: Tea every morning    Right handed    Social Determinants of Health   Financial Resource Strain: Not on file  Food Insecurity: Not on file  Transportation Needs: Not on file   Physical Activity: Not on file  Stress: Not on file  Social Connections: Not on file  Intimate Partner Violence: Not on file   PHYSICAL EXAM  Vitals:   02/07/23 0753  BP: 110/60  Pulse: 96  Resp: 15  Weight: 179 lb (81.2 kg)  Height: 5\' 5"  (1.651 m)   Body mass index is 29.79 kg/m.    02/07/2023    7:54 AM 09/28/2021    1:59 PM 03/02/2021    7:42 AM  Montreal Cognitive Assessment   Visuospatial/ Executive (0/5) 4 4 4   Naming (0/3) 2 3 3   Attention: Read list of digits (0/2) 1 2 2   Attention: Read list of letters (0/1) 1 1 1   Attention: Serial 7 subtraction starting at 100 (0/3) 3 3 3   Language: Repeat phrase (0/2) 1 1 2   Language : Fluency (0/1) 1 1 1   Abstraction (0/2) 2 1 2   Delayed Recall (0/5) 0 1 1  Orientation (0/6) 6 6 6   Total 21 23 25       05/19/2022   12:44 PM 09/18/2017   12:07 PM  MMSE - Mini Mental State Exam  Orientation to time 4 5  Orientation to Place 5 5  Registration 3 3  Attention/ Calculation 1 5  Recall 2 2  Language- name 2 objects 2 2  Language- repeat 1 1  Language- follow 3 step command 3 3  Language- read &  follow direction 1 1  Write a sentence 1 1  Copy design 1 1  Total score 24 29   Generalized: Well developed, in no acute distress  Neurological examination  Mentation: Alert oriented to time, place, history taking. Follows all commands speech and language fluent Cranial nerve II-XII: Pupils were equal round reactive to light. Extraocular movements were full, visual field were full on confrontational test. Facial sensation and strength were normal.  Head turning and shoulder shrug  were normal and symmetric. Motor: right BKA wearing prosthesis.  Sensory: Sensory testing is intact to soft touch on all 4 extremities. No evidence of extinction is noted.  Coordination: Cerebellar testing reveals good finger-nose-finger bilaterally Gait and station: Gait is normal.  Reflexes: Deep tendon reflexes are symmetric and normal bilaterally.    DIAGNOSTIC DATA (LABS, IMAGING, TESTING) - I reviewed patient records, labs, notes, testing and imaging myself where available.  Lab Results  Component Value Date   WBC 10.3 01/11/2019   HGB 10.4 (L) 01/11/2019   HCT 34.0 (L) 01/11/2019   MCV 98.0 01/11/2019   PLT 309 01/11/2019      Component Value Date/Time   NA 136 01/11/2019 1432   NA 142 09/18/2017 1244   K 3.8 01/11/2019 1432   CL 98 01/11/2019 1432   CO2 28 01/11/2019 1432   GLUCOSE 99 01/11/2019 1432   BUN 15 01/11/2019 1432   BUN 15 09/18/2017 1244   CREATININE 0.95 01/11/2019 1432   CREATININE 0.83 02/12/2015 0847   CALCIUM 8.7 (L) 01/11/2019 1432   PROT 6.6 01/11/2019 1432   ALBUMIN 3.2 (L) 01/11/2019 1432   AST 31 01/11/2019 1432   ALT 21 01/11/2019 1432   ALKPHOS 56 01/11/2019 1432   BILITOT 0.3 01/11/2019 1432   GFRNONAA >60 01/11/2019 1432   GFRAA >60 01/11/2019 1432   No results found for: "CHOL", "HDL", "LDLCALC", "LDLDIRECT", "TRIG", "CHOLHDL" No results found for: "HGBA1C" Lab Results  Component Value Date   VITAMINB12 >2000 (H) 03/20/2017   No results found for: "TSH"  Margie Ege, AGNP-C, DNP 02/07/2023, 8:24 AM [facility] [address] [place], [place] [address] [phone]

## 2023-02-07 ENCOUNTER — Encounter: Payer: Self-pay | Admitting: Neurology

## 2023-02-07 ENCOUNTER — Ambulatory Visit (INDEPENDENT_AMBULATORY_CARE_PROVIDER_SITE_OTHER): Payer: Medicare Other | Admitting: Neurology

## 2023-02-07 VITALS — BP 110/60 | HR 96 | Resp 15 | Ht 65.0 in | Wt 179.0 lb

## 2023-02-07 DIAGNOSIS — M810 Age-related osteoporosis without current pathological fracture: Secondary | ICD-10-CM | POA: Diagnosis not present

## 2023-02-07 DIAGNOSIS — F419 Anxiety disorder, unspecified: Secondary | ICD-10-CM | POA: Diagnosis not present

## 2023-02-07 DIAGNOSIS — G3184 Mild cognitive impairment, so stated: Secondary | ICD-10-CM

## 2023-02-07 NOTE — Patient Instructions (Signed)
I will re-order neuropsychological evaluation with Dr. Rueben Bash   Recommend better control of anxiety, stress  Recommend exercise, brain stimulating activity   Follow up in 6 months after neuro psych eval

## 2023-02-09 ENCOUNTER — Telehealth: Payer: Self-pay | Admitting: Neurology

## 2023-02-09 NOTE — Telephone Encounter (Signed)
Referral for neuropsychology fax to [facility] to see Dr. Eileen Stanford Renfroe. Phone: [phone], Fax: [phone].

## 2023-02-14 ENCOUNTER — Encounter: Payer: Self-pay | Admitting: Podiatry

## 2023-02-14 ENCOUNTER — Ambulatory Visit (INDEPENDENT_AMBULATORY_CARE_PROVIDER_SITE_OTHER): Payer: Medicare Other | Admitting: Podiatry

## 2023-02-14 DIAGNOSIS — I809 Phlebitis and thrombophlebitis of unspecified site: Secondary | ICD-10-CM

## 2023-02-14 DIAGNOSIS — M79675 Pain in left toe(s): Secondary | ICD-10-CM

## 2023-02-14 DIAGNOSIS — B351 Tinea unguium: Secondary | ICD-10-CM | POA: Diagnosis not present

## 2023-02-14 NOTE — Progress Notes (Addendum)
  Subjective:  Patient ID: Shelly [name], female    DOB: 07/15/1950,  MRN: [id number]  Chief Complaint  Patient presents with   Nail Problem    Patient is here for rfc for painful nail fungus    72 y.o. female presents with the above complaint. History confirmed with patient.  Remaining nails are thick and elongated causing discomfort.  She is having a new issue of intermittent swelling and redness in the lower calf that feels tender.  Does not have any pain in the back of the calf shortness of breath or chest pain.  Objective:  Physical Exam: warm, good capillary refill, no trophic changes or ulcerative lesions, normal DP and PT pulses, and normal sensory exam.  No evidence of DVT on examination.  Minimal varicosities Left Foot: Thickened elongated toenails with subungual debris and discoloration  Radiographs: Multiple views x-ray of the left foot: No osseous abnormalities in the area of concern there is prominence of the cuboid and fifth met base     Assessment:   1. Pain due to onychomycosis of toenail of left foot   2. Phlebitis       Plan:  Patient was evaluated and treated and all questions answered.  Recommended debridement of the nails today. Sharp and mechanical debridement performed of all painful and mycotic nails today. Nails debrided in length and thickness using a nail nipper to level of comfort.   We also discussed she is having what is likely intermittent phlebitis.  There is no evidence of thrombosis or DVT.  We discussed evaluation of this and she will contact Dr. Jimmie Molly at [facility] for evaluation, she will let me know if she needs a referral for this.   Return in about 3 months (around 05/16/2023) for at risk diabetic foot care.

## 2023-02-20 ENCOUNTER — Encounter: Payer: Self-pay | Admitting: Internal Medicine

## 2023-02-21 NOTE — Telephone Encounter (Signed)
She is scheduled for 11/21 and 12/4.

## 2023-03-09 DIAGNOSIS — M7989 Other specified soft tissue disorders: Secondary | ICD-10-CM | POA: Diagnosis not present

## 2023-03-09 DIAGNOSIS — Z23 Encounter for immunization: Secondary | ICD-10-CM | POA: Diagnosis not present

## 2023-03-09 DIAGNOSIS — M79662 Pain in left lower leg: Secondary | ICD-10-CM | POA: Diagnosis not present

## 2023-03-10 ENCOUNTER — Ambulatory Visit (HOSPITAL_COMMUNITY)
Admission: RE | Admit: 2023-03-10 | Discharge: 2023-03-10 | Disposition: A | Discharge status: Home / Self Care | Payer: Medicare Other | Source: Ambulatory Visit | Attending: Cardiology | Admitting: Cardiology

## 2023-03-10 ENCOUNTER — Other Ambulatory Visit (HOSPITAL_COMMUNITY): Payer: Self-pay | Admitting: Family Medicine

## 2023-03-10 DIAGNOSIS — M7989 Other specified soft tissue disorders: Secondary | ICD-10-CM

## 2023-03-10 DIAGNOSIS — M79662 Pain in left lower leg: Secondary | ICD-10-CM

## 2023-03-30 ENCOUNTER — Other Ambulatory Visit: Payer: Self-pay | Admitting: Family Medicine

## 2023-03-30 DIAGNOSIS — F325 Major depressive disorder, single episode, in full remission: Secondary | ICD-10-CM | POA: Diagnosis not present

## 2023-03-30 DIAGNOSIS — M79605 Pain in left leg: Secondary | ICD-10-CM

## 2023-03-30 DIAGNOSIS — I10 Essential (primary) hypertension: Secondary | ICD-10-CM | POA: Diagnosis not present

## 2023-03-30 DIAGNOSIS — I700 Atherosclerosis of aorta: Secondary | ICD-10-CM | POA: Diagnosis not present

## 2023-03-30 DIAGNOSIS — N1831 Chronic kidney disease, stage 3a: Secondary | ICD-10-CM | POA: Diagnosis not present

## 2023-03-30 DIAGNOSIS — Z89511 Acquired absence of right leg below knee: Secondary | ICD-10-CM | POA: Diagnosis not present

## 2023-03-30 DIAGNOSIS — Z0000 Encounter for general adult medical examination without abnormal findings: Secondary | ICD-10-CM | POA: Diagnosis not present

## 2023-03-30 DIAGNOSIS — J45991 Cough variant asthma: Secondary | ICD-10-CM | POA: Diagnosis not present

## 2023-03-30 DIAGNOSIS — Z1331 Encounter for screening for depression: Secondary | ICD-10-CM | POA: Diagnosis not present

## 2023-03-30 DIAGNOSIS — G3184 Mild cognitive impairment, so stated: Secondary | ICD-10-CM | POA: Diagnosis not present

## 2023-03-30 DIAGNOSIS — M25511 Pain in right shoulder: Secondary | ICD-10-CM | POA: Diagnosis not present

## 2023-03-30 DIAGNOSIS — E785 Hyperlipidemia, unspecified: Secondary | ICD-10-CM | POA: Diagnosis not present

## 2023-04-01 ENCOUNTER — Emergency Department (HOSPITAL_BASED_OUTPATIENT_CLINIC_OR_DEPARTMENT_OTHER)
Admission: EM | Admit: 2023-04-01 | Discharge: 2023-04-02 | Disposition: A | Discharge status: Home / Self Care | Payer: Medicare Other | Attending: Emergency Medicine | Admitting: Emergency Medicine

## 2023-04-01 ENCOUNTER — Encounter (HOSPITAL_BASED_OUTPATIENT_CLINIC_OR_DEPARTMENT_OTHER): Payer: Self-pay | Admitting: Emergency Medicine

## 2023-04-01 DIAGNOSIS — R1031 Right lower quadrant pain: Secondary | ICD-10-CM | POA: Diagnosis not present

## 2023-04-01 LAB — COMPREHENSIVE METABOLIC PANEL
ALT: 19 U/L (ref 0–44)
AST: 27 U/L (ref 15–41)
Albumin: 4.2 g/dL (ref 3.5–5.0)
Alkaline Phosphatase: 39 U/L (ref 38–126)
Anion gap: 9 (ref 5–15)
BUN: 15 mg/dL (ref 8–23)
CO2: 27 mmol/L (ref 22–32)
Calcium: 9.7 mg/dL (ref 8.9–10.3)
Chloride: 100 mmol/L (ref 98–111)
Creatinine, Ser: 0.78 mg/dL (ref 0.44–1.00)
GFR, Estimated: >60 mL/min (ref >60)
Glucose, Bld: 108 mg/dL — ABNORMAL HIGH (ref 70–99)
Potassium: 4.8 mmol/L (ref 3.5–5.1)
Sodium: 136 mmol/L (ref 135–145)
Total Bilirubin: 0.3 mg/dL (ref 0.3–1.2)
Total Protein: 7.4 g/dL (ref 6.5–8.1)

## 2023-04-01 LAB — CBC
HCT: 35.5 % — ABNORMAL LOW (ref 36.0–46.0)
Hemoglobin: 11.4 g/dL — ABNORMAL LOW (ref 12.0–15.0)
MCH: 31.1 pg (ref 26.0–34.0)
MCHC: 32.1 g/dL (ref 30.0–36.0)
MCV: 96.7 fL (ref 80.0–100.0)
Platelets: 286 10*3/uL (ref 150–400)
RBC: 3.67 MIL/uL — ABNORMAL LOW (ref 3.87–5.11)
RDW: 12.6 % (ref 11.5–15.5)
WBC: 8 10*3/uL (ref 4.0–10.5)
nRBC: 0 % (ref 0.0–0.2)

## 2023-04-01 LAB — URINALYSIS, ROUTINE W REFLEX MICROSCOPIC
Bilirubin Urine: NEGATIVE
Glucose, UA: NEGATIVE mg/dL
Hgb urine dipstick: NEGATIVE
Ketones, ur: NEGATIVE mg/dL
Leukocytes,Ua: NEGATIVE
Nitrite: NEGATIVE
Protein, ur: NEGATIVE mg/dL
Specific Gravity, Urine: 1.027 (ref 1.005–1.030)
pH: 5.5 (ref 5.0–8.0)

## 2023-04-01 LAB — LIPASE, BLOOD: Lipase: 14 U/L (ref 11–51)

## 2023-04-01 MED ORDER — OXYCODONE-ACETAMINOPHEN 5-325 MG PO TABS
1.0000 | ORAL_TABLET | ORAL | Status: DC | PRN
  Administered 2023-04-01: 1 via ORAL
  Filled 2023-04-01: qty 1

## 2023-04-01 NOTE — ED Triage Notes (Signed)
Right side flank/ abdo pain Sharp pains  Started around 3pm Denies n/v/d

## 2023-04-01 NOTE — ED Provider Notes (Signed)
[place] EMERGENCY DEPARTMENT AT [facility]  Provider Note  CSN: [id number] Arrival date & time: 04/01/23 1830  History Chief Complaint  Patient presents with   Flank Pain    Shelly [name] is a 72 y.o. female reports onset of severe cramping RLQ abdominal pain earlier this afternoon, she was concerned about possible appendicitis (she had forgotten she had an appendectomy years ago). She was not having any N/V/D or fever. She did not have a BM today but had one yesterday which was normal. She was given percocet in triage and pain resolved about later. She has been pain free while awaiting ED exam room.    Home Medications Prior to Admission medications   Medication Sig Start Date End Date Taking? Authorizing Provider  Acetylcarnitine HCl (ACETYL L-CARNITINE) 500 MG CAPS Take by mouth daily.    [provider]  acidophilus (RISAQUAD) CAPS capsule Take 1 capsule by mouth daily.    [provider]  B Complex-Folic Acid (B COMPLEX PLUS PO) 1 tablet    [provider]  benzonatate (TESSALON) 200 MG capsule TAKE 1 CAPSULE(200 MG) BY MOUTH THREE TIMES DAILY AS NEEDED FOR COUGH 08/24/22   Nyoka Cowden, MD  benzonatate (TESSALON) 200 MG capsule Take 1 capsule (200 mg total) by mouth 3 (three) times daily as needed for cough. 08/22/22   Nyoka Cowden, MD  Biotin 5000 MCG TABS Take 10,000 mcg by mouth every evening.    [provider]  budesonide-formoterol (SYMBICORT) 80-4.5 MCG/ACT inhaler Take 2 puffs first thing in am and then another 2 puffs about 12 hours later. 10/06/22   Nyoka Cowden, MD  buPROPion (WELLBUTRIN XL) 300 MG 24 hr tablet Take 300 mg by mouth daily.    [provider]  Calcium Carbonate (CALCIUM 600 PO) Take by mouth daily.    [provider]  cefdinir (OMNICEF) 300 MG capsule Take 1 capsule (300 mg total) by mouth 2 (two) times daily. 12/23/22   Leslye Peer, MD  chlorpheniramine (CHLOR-TRIMETON)  4 MG tablet Take 8 mg by mouth 2 (two) times a day.     [provider]  Cholecalciferol (VITAMIN D-3) 125 MCG (5000 UT) TABS Take by mouth daily.    [provider]  Cyanocobalamin (B-12 PO) Take 5,000 mcg by mouth daily.     [provider]  denosumab (PROLIA) 60 MG/ML SOLN injection Inject 60 mg into the skin every 6 (six) months.     [provider]  FLUoxetine (PROZAC) 20 MG capsule Take 20 mg by mouth daily.    [provider]  gabapentin (NEURONTIN) 100 MG capsule Take 1 capsule (100 mg total) by mouth 3 (three) times daily. 05/16/22   Nyoka Cowden, MD  losartan (COZAAR) 50 MG tablet Take 50 mg by mouth daily.  09/12/18   [provider]  MAGNESIUM GLYCINATE PO Take by mouth.    [provider]  methocarbamol (ROBAXIN) 500 MG tablet Take 500 mg by mouth every 8 (eight) hours as needed. 06/10/19   [provider]  montelukast (SINGULAIR) 10 MG tablet Take 1 tablet (10 mg total) by mouth daily. 10/18/22   Nyoka Cowden, MD  Multiple Vitamins-Minerals (CENTRUM SILVER PO) Take 1 tablet by mouth daily.    [provider]  neomycin-polymyxin-hydrocortisone (CORTISPORIN) 3.5-10000-1 OTIC suspension Apply 1-2 drops daily after soaking and cover with bandaid 05/16/22   McDonald, Rachelle Hora, DPM  pantoprazole (PROTONIX) 40 MG tablet Take  1 tablet (40 mg total) by mouth 2 (two) times daily. Take 30 TO 60 MINUTES BEFORE FIRST and LAST MEAL OF THE DAY. 08/08/22   Nyoka Cowden, MD  Potassium 99 MG TABS Take by mouth.    [provider]  rosuvastatin (CRESTOR) 10 MG tablet Take 10 mg by mouth every evening.  10/18/17   [provider]  Theanine 50 MG TBDP Take by mouth.    [provider]  traMADol (ULTRAM) 50 MG tablet Take 50 mg by mouth 3 (three) times daily as needed. 09/03/20   [provider]  Turmeric (QC TUMERIC COMPLEX PO) Take 1,400 mg by mouth daily.    [provider]   valACYclovir (VALTREX) 1000 MG tablet Take 1,000 mg by mouth daily as needed.  06/09/09   [provider]     Allergies    Dextromethorphan   Review of Systems   Review of Systems Please see HPI for pertinent positives and negatives  Physical Exam BP 118/70   Pulse 85   Temp 98.1 F (36.7 C) (Oral)   Resp 20   LMP  (LMP Unknown)   SpO2 95%   Physical Exam Vitals and nursing note reviewed.  Constitutional:      Appearance: Normal appearance.  HENT:     Head: Normocephalic and atraumatic.     Nose: Nose normal.     Mouth/Throat:     Mouth: Mucous membranes are moist.  Eyes:     Extraocular Movements: Extraocular movements intact.     Conjunctiva/sclera: Conjunctivae normal.  Cardiovascular:     Rate and Rhythm: Normal rate.  Pulmonary:     Effort: Pulmonary effort is normal.     Breath sounds: Normal breath sounds.  Abdominal:     General: Abdomen is flat.     Palpations: Abdomen is soft.     Tenderness: There is no abdominal tenderness. There is no guarding.  Musculoskeletal:        General: No swelling. Normal range of motion.     Cervical back: Neck supple.  Skin:    General: Skin is warm and dry.  Neurological:     General: No focal deficit present.     Mental Status: She is alert.  Psychiatric:        Mood and Affect: Mood normal.     ED Results / Procedures / Treatments   EKG None  Procedures Procedures  Medications Ordered in the ED Medications  oxyCODONE-acetaminophen (PERCOCET/ROXICET) 5-325 MG per tablet 1 tablet (1 tablet Oral Given 04/01/23 2002)    Initial Impression and Plan  Patient here for RLQ abdominal pain, since resolved. She has a normal exam now. Labs done in triage show normal CBC, CMP, Lipase and UA. No concern for biliary disease, pancreatitis or SBO. Suspect gas pains/constipation. Offered CT scan, but she is asymptomatic now and would like to go home. She has Rx for Tramadol for her chronic back pain. Encouraged to  RTED if her pain recurs, otherwise PCP follow up.   ED Course       MDM Rules/Calculators/A&P Medical Decision Making Problems Addressed: RLQ abdominal pain: acute illness or injury  Amount and/or Complexity of Data Reviewed Labs: ordered. Decision-making details documented in ED Course.  Risk Prescription drug management.     Final Clinical Impression(s) / ED Diagnoses Final diagnoses:  RLQ abdominal pain    Rx / DC Orders ED Discharge Orders     None  Pollyann Savoy, MD 04/01/23 [phone]

## 2023-04-02 NOTE — ED Notes (Signed)
 RN reviewed discharge instructions with pt. Pt verbalized understanding and had no further questions. VSS upon discharge.  

## 2023-04-06 DIAGNOSIS — H02052 Trichiasis without entropian right lower eyelid: Secondary | ICD-10-CM | POA: Diagnosis not present

## 2023-04-08 ENCOUNTER — Other Ambulatory Visit: Payer: Self-pay | Admitting: Internal Medicine

## 2023-04-10 ENCOUNTER — Ambulatory Visit (HOSPITAL_COMMUNITY)
Admission: RE | Admit: 2023-04-10 | Discharge: 2023-04-10 | Disposition: A | Discharge status: Home / Self Care | Payer: Medicare Other | Source: Ambulatory Visit | Attending: Interventional Cardiology | Admitting: Interventional Cardiology

## 2023-04-10 DIAGNOSIS — Z8673 Personal history of transient ischemic attack (TIA), and cerebral infarction without residual deficits: Secondary | ICD-10-CM | POA: Diagnosis not present

## 2023-04-10 DIAGNOSIS — I779 Disorder of arteries and arterioles, unspecified: Secondary | ICD-10-CM | POA: Insufficient documentation

## 2023-04-11 ENCOUNTER — Telehealth: Payer: Self-pay | Admitting: *Deleted

## 2023-04-11 DIAGNOSIS — I779 Disorder of arteries and arterioles, unspecified: Secondary | ICD-10-CM

## 2023-04-11 NOTE — Telephone Encounter (Signed)
Patient notified.  She has appointment with Jari Favre, PA on 08/08/23

## 2023-04-11 NOTE — Telephone Encounter (Signed)
-----   Message from Rollene Rotunda sent at 04/10/2023  1:36 PM EST ----- Moderate left carotid plaque.  Repeat Doppler in one year.  Also, please schedule follow up if not done with new cardiologist.  Thanks.

## 2023-04-26 DIAGNOSIS — M51362 Other intervertebral disc degeneration, lumbar region with discogenic back pain and lower extremity pain: Secondary | ICD-10-CM | POA: Diagnosis not present

## 2023-04-26 DIAGNOSIS — M79605 Pain in left leg: Secondary | ICD-10-CM | POA: Diagnosis not present

## 2023-04-26 DIAGNOSIS — M545 Low back pain, unspecified: Secondary | ICD-10-CM | POA: Diagnosis not present

## 2023-04-26 DIAGNOSIS — M5412 Radiculopathy, cervical region: Secondary | ICD-10-CM | POA: Diagnosis not present

## 2023-04-26 DIAGNOSIS — M47896 Other spondylosis, lumbar region: Secondary | ICD-10-CM | POA: Diagnosis not present

## 2023-04-28 ENCOUNTER — Ambulatory Visit: Payer: Medicare Other | Admitting: Internal Medicine

## 2023-05-03 ENCOUNTER — Ambulatory Visit: Payer: Medicare Other

## 2023-05-03 ENCOUNTER — Encounter: Payer: Self-pay | Admitting: Internal Medicine

## 2023-05-03 ENCOUNTER — Ambulatory Visit: Payer: Medicare Other | Admitting: Internal Medicine

## 2023-05-03 VITALS — BP 138/69 | HR 94 | Temp 98.5°F | Ht 60.0 in | Wt 177.2 lb

## 2023-05-03 DIAGNOSIS — J45991 Cough variant asthma: Secondary | ICD-10-CM | POA: Diagnosis not present

## 2023-05-03 DIAGNOSIS — R059 Cough, unspecified: Secondary | ICD-10-CM

## 2023-05-03 DIAGNOSIS — R9389 Abnormal findings on diagnostic imaging of other specified body structures: Secondary | ICD-10-CM

## 2023-05-03 MED ORDER — ALBUTEROL SULFATE HFA 108 (90 BASE) MCG/ACT IN AERS: INHALATION_SPRAY | RESPIRATORY_TRACT | 11 refills | Status: DC

## 2023-05-03 MED ORDER — BENZONATATE 200 MG PO CAPS: 200.0000 mg | ORAL_CAPSULE | Freq: Three times a day (TID) | ORAL | 1 refills | Status: DC | PRN

## 2023-05-03 MED ORDER — METHYLPREDNISOLONE ACETATE 80 MG/ML IJ SUSP
120.0000 mg | Freq: Once | INTRAMUSCULAR | Status: AC
  Administered 2023-05-03: 120 mg via INTRAMUSCULAR

## 2023-05-03 MED ORDER — CEFDINIR 300 MG PO CAPS
300.0000 mg | ORAL_CAPSULE | Freq: Two times a day (BID) | ORAL | 0 refills | Status: DC
Start: 2023-05-03 — End: 2023-05-26

## 2023-05-03 NOTE — Patient Instructions (Addendum)
Omnicef 300 mg twice daily x 10 days  Depomedrol 120 mg IM   For cough > Mucinex DM 1200 mg twice daily and supplement with tessalon 200 mg every 4-6 hours as needed   My office will be contacting you by phone for referral to allergy   - if you don't hear back from my office within one week please call us back or notify us thru MyChart and we'll address it right away.  Please remember to go to the  x-ray department  for your tests - we will call you with the results when they are available    Pulmonary follow up is as needed

## 2023-05-03 NOTE — Progress Notes (Signed)
Subjective:   Patient ID: Shelly Shelly [name], female    DOB: [birth year]       MRN: [id number]    Brief patient profile:  52 yowf mother of optimetrist  quit smoking 2008  watery eyes in her 20s year round ? Worse in winter eventually started on zyrtec 2013 and seemed to help s assoc cough/ wheeze then new onset cough Aug 2015 and proved refractory so referred to [name] 05/15/14  by Shelly Shelly [name]    History of Present Illness  05/15/2014 1st Shelly Shelly [name] office visit/ Shelly Shelly [name]   Chief Complaint  Patient presents with   Shelly [name] Consult    Referred by Shelly. Merri Shelly [name]. Pt c/o cough since August 2015- cough is non prod and somtimes seems worse at night. She also c/o DOE with walking up stairs.    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   Prednisone 10 mg take  4 each am x 2 days,   2 each am x 2 days,  1 each am x 2 days and stop (this is to eliminate allergies and inflammation from coughing) Omeprazole 40 mg  Take 30-60 min before first meal of the day and Pepcid 20 mg one bedtime plus chlorpheniramine 4 mg x 2 at bedtime  GERD diet reviewed     10/18/2018   Shelly [name]/ re-estabish/Shelly Shelly [name] re:  Tendency to cough sporadic pattern x years, recurrent since March 2020 while on singulair  Chief Complaint  Patient presents with   Shelly [name] Consult    Self referral.  Pt states has had increased cough x 2 months.  Cough is currently non  prod. She has not had to use her proair in the past wk.     was maint on singulair,  h1 x 2 hs and no gerd rx then acutely worse x 2 months s obvious trigger Typically year round pattern is that every few weeks the cough starts back up  Dyspnea:  MMRC1 = even on best edays  can walk nl pace, flat grade, can't hurry or go uphills or steps s sob   Cough: mostly dry / gag ? Sometime vomit when cough is severe and "almost pass out"  Sleeping: bed is flat / cough was bad at night better now  SABA use: saba helped as does prednisone  02: 02  Using lots of mints  acei intol ? When  d/c'd (started summer 2019)  rec Plan A = Automatic =Dulera 100 Take 2 puffs first thing in am and then another 2 puffs about 12 hours later and continue singulair 10 mg each pm  Work on inhaler technique:   Plan B = Backup Only use your albuterol inhaler as a rescue medication  Whenever cough start  prilosec 20 mg Take 30- 60 min before your first and last meals of the day until no longer coughing and  For drainage /  throat tickle try take CHLORPHENIRAMINE  4 mg  (Chlortab 4mg   at [employer] should be easiest to find in the green box)  take one every 4 hours as needed - available over the counter- may cause drowsiness so start with just a bedtime dose or two and see how you tolerate it before trying in daytime          10/06/2022  f/u ov/Shelly Shelly [name] re: cough x 2015   maint on symbicort 80 and singulair / ppi bid  /gabapentin intol Chief Complaint  Patient presents with   Follow-up    Cough and sneezing with spring allergies  Dyspnea:  ok walking outside with dog  Cough: variable but not bad enough to take  tessalon much less neurontin Sleeping: one pillow flat bed no resp cc  SABA use: none  02: none  Rec Symbicort 80 = Dulera 100 interchangeable  and both are used up to 2 every 12h   12/23/22  rx omnicef by Shelly [name]   05/03/2023  6 m f/u ov/Shelly Shelly [name] re: cough x 2015    maint on symbicort 80 singulair 10 / gerd  rx  acutely worse than usual x one week  Chief Complaint  Patient presents with   Follow-up    Productive Cough with green mucus x 4 days.  Sx worse at bedtime.   Dyspnea:  breathing fine if not coughing  Cough: onset x one week worse than usual but comes and goes for year> this is productive dark green Presyncope and urinary incont  Sleeping: typically not  noct but it was the night  SABA use: none       No obvious day to day or daytime variability or assoc e  mucus plugs or hemoptysis or cp or chest tightness, subjective wheeze or overt sinus or hb symptoms.    Also denies any obvious fluctuation of symptoms with weather or environmental changes or other aggravating or alleviating factors except as outlined above   No unusual exposure hx or h/o childhood pna/ asthma or knowledge of premature birth.  Current Allergies, Complete Past Medical History, Past Surgical History, Family History, and Social History were reviewed in [employer] record.  ROS  The following are not active complaints unless bolded Hoarseness, sore throat, dysphagia, dental problems, itching, sneezing,  nasal congestion or discharge of excess mucus or purulent secretions, ear ache,   fever, chills, sweats, unintended wt loss or wt gain, classically pleuritic or exertional cp,  orthopnea pnd or arm/hand swelling  or leg swelling, presyncope, palpitations, abdominal pain, anorexia, nausea, vomiting, diarrhea  or change in bowel habits or change in bladder habits, change in stools or change in urine, dysuria, hematuria,  rash, arthralgias, visual complaints, headache, numbness, weakness or ataxia or problems with walking or coordination,  change in mood or  memory.        Current Meds  Medication Sig   Acetylcarnitine HCl (ACETYL L-CARNITINE) 500 MG CAPS Take by mouth daily.   acidophilus (RISAQUAD) CAPS capsule Take 1 capsule by mouth daily.   B Complex-Folic Acid (B COMPLEX PLUS PO) 1 tablet    benzonatate (TESSALON) 200 MG capsule Take 1 capsule (200 mg total) by mouth 3 (three) times daily as needed for cough.   Biotin 5000 MCG TABS Take 10,000 mcg by mouth every evening.   budesonide-formoterol (SYMBICORT) 80-4.5 MCG/ACT inhaler Take 2 puffs first thing in am and then another 2 puffs about 12 hours later.   buPROPion (WELLBUTRIN XL) 300  MG 24 hr tablet Take 300 mg by mouth daily.   Calcium Carbonate (CALCIUM 600 PO) Take by mouth daily.   chlorpheniramine (CHLOR-TRIMETON) 4 MG tablet Take 8 mg by mouth 2 (two) times a day.    Cholecalciferol (VITAMIN D-3) 125 MCG (5000 UT) TABS Take by mouth daily.   Cyanocobalamin (B-12 PO) Take 5,000 mcg by mouth daily.    denosumab (PROLIA) 60 MG/ML SOLN injection Inject 60 mg into the skin every 6 (six) months.    FLUoxetine (PROZAC) 20 MG capsule Take 20 mg by mouth daily.   losartan (COZAAR) 50 MG tablet Take 50 mg by mouth daily.    MAGNESIUM GLYCINATE PO Take by mouth.   metaxalone (SKELAXIN) 800 MG tablet 800 mg.   methocarbamol (ROBAXIN) 500 MG tablet Take 500 mg by mouth every 8 (eight) hours as needed.   montelukast (SINGULAIR) 10 MG tablet TAKE 1 TABLET(10 MG) BY MOUTH DAILY   Multiple Vitamins-Minerals (CENTRUM SILVER PO) Take 1 tablet by mouth daily.   pantoprazole (PROTONIX) 40 MG tablet Take 1 tablet (40 mg total) by mouth 2 (two) times daily. Take 30 TO 60 MINUTES BEFORE FIRST and LAST MEAL OF THE DAY.   Potassium 99 MG TABS Take by mouth.   rosuvastatin (CRESTOR) 10 MG tablet Take 10 mg by mouth every evening.    Theanine 50 MG TBDP Take by mouth.   traMADol (ULTRAM) 50 MG tablet Take 50 mg by mouth 3 (three) times daily as needed.   triamcinolone cream (KENALOG) 0.1 % Apply 1 Application topically 2 (two) times daily.   Turmeric (QC TUMERIC COMPLEX PO) Take 1,400 mg by mouth daily.   valACYclovir (VALTREX) 1000 MG tablet Take 1,000 mg by mouth daily as needed.                  Objective:  Physical  Exam  Wts  05/03/2023     177  10/06/2022         182  08/22/2022       184  05/16/2022     183  02/21/2022       188 03/22/2021     183  11/14/2019      184 03/25/2019     188  12/12/2018         188  05/28/2014      208 >  06/11/14  206 >   06/30/2014 207  > 10/18/2018  187           05/15/14 207 lb (93.895 kg)  04/29/13 193 lb (87.544 kg)  09/08/10 195 lb (88.451 kg)       Vital signs reviewed  05/03/2023  - Note at rest 02 sats  92% on RA   General appearance:    amb wf harsh cough on exp    HEENT : Oropharynx  clear      Nasal turbinates nl    NECK :  without  apparent JVD/ palpable Nodes/TM    LUNGS: no acc muscle use,  Nl contour chest with a few insp rhonchi in R base and cough on FVC maneuvers    CV:  RRR  no s3 or murmur or increase in P2, and no edema   ABD:  soft and nontender with nl inspiratory excursion in the supine position. No bruits or organomegaly appreciated   MS:  Nl gait/ ext warm with R BKA prosthesis  s obvious joint restrictions  calf tenderness, cyanosis or clubbing    SKIN:  warm and dry without lesions    NEURO:  alert, approp, nl sensorium with  no motor or cerebellar deficits apparent.       CXR PA and Lateral:   05/03/2023 :    I personally reviewed images and impression is as follows:     Elevation of LHD chronic/ no as Dz            Assessment & Plan:

## 2023-05-03 NOTE — Assessment & Plan Note (Signed)
Onset Aug 2015 sporadic, severe, not seasonal/ and throat clearing ever since  - allergy profile 05/28/2014 > No eos, IgE 424 POS RAST only dust and mold  -  Sinus/chest  CT 06/03/2014 > wnl  - Trial of singulair 06/30/2014 >>>  ? Benefit  - 2019 proved intolerant of ACEi  - 10/18/2018  After extensive coaching inhaler device,  effectiveness =  75%> try dulera 100 2bid and 1st gen H1 blockers per guidelines    no mints > 100% resolution s gerd rx needed  - 05/10/2019 recurrence on symb 80/off ppi p [place] food > restart with just tessalon prn  - recurred late may 2020 while maint on  pantoprarzole / h1 bid and singulair - 01/20/20 started back on symb 80 2bid as maint x one month  > only took a week and "all better"  - 03/05/2020 reported cough /throat clearing stopped after one week of symbicort (but also on tramadol), continued ppi bid and remained cough free so rec if cough recurs take symb 80 2bid s tramadol and continue ppi bid/ diet x 3 months then ov  - 05/16/2022  gabapentin 100 mg tid > ? Seemed to help but did not maintain - 08/22/22 cough flared again   off gabapentin > resolved to her satisfaction 10/06/2022  -  bad flare 12/2022 and again Apr 27 2023 > omincef/ depomedrol and tessalon and refer  to allergy  Says usually coughs "just like her dad" but now actually worse with purulent sputum x one week so rec:  Omnicef 300 mg twice daily x 10 days  Depomedrol 120 mg IM   For cough > Mucinex DM 1200 mg twice daily and supplement with tessalon 200 mg every 4-6 hours as needed   Refer to allergy next and back here prn once establilshed         Each maintenance medication was reviewed in detail including emphasizing most importantly the difference between maintenance and prns and under what circumstances the prns are to be triggered using an action plan format where appropriate.  Total time for H and P, chart review, counseling, reviewing hfa  device(s) and generating customized AVS  unique to this office visit / same day charting  > 30 min for   refractory respiratory  symptoms of uncertain etiology

## 2023-05-08 ENCOUNTER — Other Ambulatory Visit: Payer: Self-pay | Admitting: *Deleted

## 2023-05-08 DIAGNOSIS — R600 Localized edema: Secondary | ICD-10-CM

## 2023-05-09 ENCOUNTER — Ambulatory Visit
Admission: RE | Admit: 2023-05-09 | Discharge: 2023-05-09 | Disposition: A | Discharge status: Home / Self Care | Payer: Medicare Other | Source: Ambulatory Visit | Attending: Family Medicine | Admitting: Family Medicine

## 2023-05-09 DIAGNOSIS — M79605 Pain in left leg: Secondary | ICD-10-CM

## 2023-05-09 DIAGNOSIS — M7989 Other specified soft tissue disorders: Secondary | ICD-10-CM | POA: Diagnosis not present

## 2023-05-10 DIAGNOSIS — M5412 Radiculopathy, cervical region: Secondary | ICD-10-CM | POA: Diagnosis not present

## 2023-05-10 DIAGNOSIS — Z978 Presence of other specified devices: Secondary | ICD-10-CM | POA: Diagnosis not present

## 2023-05-11 ENCOUNTER — Ambulatory Visit: Payer: Medicare Other | Admitting: Neurology

## 2023-05-12 ENCOUNTER — Ambulatory Visit (HOSPITAL_COMMUNITY)
Admission: RE | Admit: 2023-05-12 | Discharge: 2023-05-12 | Disposition: A | Discharge status: Home / Self Care | Payer: Medicare Other | Source: Ambulatory Visit | Attending: Vascular Surgery | Admitting: Vascular Surgery

## 2023-05-12 DIAGNOSIS — R600 Localized edema: Secondary | ICD-10-CM | POA: Diagnosis not present

## 2023-05-12 LAB — VAS US ABI WITH/WO TBI: Left ABI: 1.14

## 2023-05-18 ENCOUNTER — Ambulatory Visit: Payer: Medicare Other | Admitting: Podiatry

## 2023-05-23 ENCOUNTER — Other Ambulatory Visit: Payer: Medicare Other

## 2023-05-25 ENCOUNTER — Encounter: Payer: Medicare Other | Admitting: Vascular Surgery

## 2023-05-26 ENCOUNTER — Telehealth: Payer: Self-pay | Admitting: *Deleted

## 2023-05-26 NOTE — Telephone Encounter (Signed)
   Pre-operative Risk Assessment    Patient Name: Shelly [name]  DOB: Aug 29, 1950 MRN: [id number]      Request for Surgical Clearance    Procedure:   SPINAL CORD STIMULATOR BATTERY REPLACEMENT  Date of Surgery:  Clearance 06/23/23                                 Surgeon:  DR. Higinio Plan BROOKS Surgeon's Group or Practice Name:  [facility] Phone number:  [phone] Fax number:  [phone]   Type of Clearance Requested:   - Medical    Type of Anesthesia:  Not Indicated   Additional requests/questions:    Shelly [name]   05/26/2023, 9:42 AM

## 2023-05-26 NOTE — Telephone Encounter (Signed)
   Name: Shelly [name]  DOB: 01-09-1951  MRN: [id number]  Primary Cardiologist: Lance Muss, MD   Preoperative team, please contact this patient and set up a phone call appointment for further preoperative risk assessment. Please obtain consent and complete medication review. Thank you for your help.  I confirm that guidance regarding antiplatelet and oral anticoagulation therapy has been completed and, if necessary, noted below.  None  I also confirmed the patient resides in the state of [place]. As per [facility] Medical Board telemedicine laws, the patient must reside in the state in which the provider is licensed.   Napoleon Form, Leodis Rains, NP 05/26/2023, 10:04 AM  [facility]

## 2023-05-26 NOTE — Telephone Encounter (Signed)
Pt has been scheduled for a tele visit, 06/13/23 9:20.  Consent on file / medications reconciled.

## 2023-05-26 NOTE — Telephone Encounter (Signed)
Pt has been scheduled for a tele visit, 06/13/23 9:20.  Consent on file / medications reconciled.     Patient Consent for Virtual Visit        Shelly [name] has provided verbal consent on 05/26/2023 for a virtual visit (video or telephone).   CONSENT FOR VIRTUAL VISIT FOR:  Shelly [name]  By participating in this virtual visit I agree to the following:  I hereby voluntarily request, consent and authorize [facility] and its employed or contracted physicians, physician assistants, nurse practitioners or other licensed health care professionals (the Practitioner), to provide me with telemedicine health care services (the "Services") as deemed necessary by the treating Practitioner. I acknowledge and consent to receive the Services by the Practitioner via telemedicine. I understand that the telemedicine visit will involve communicating with the Practitioner through live audiovisual communication technology and the disclosure of certain medical information by electronic transmission. I acknowledge that I have been given the opportunity to request an in-person assessment or other available alternative prior to the telemedicine visit and am voluntarily participating in the telemedicine visit.  I understand that I have the right to withhold or withdraw my consent to the use of telemedicine in the course of my care at any time, without affecting my right to future care or treatment, and that the Practitioner or I may terminate the telemedicine visit at any time. I understand that I have the right to inspect all information obtained and/or recorded in the course of the telemedicine visit and may receive copies of available information for a reasonable fee.  I understand that some of the potential risks of receiving the Services via telemedicine include:  Delay or interruption in medical evaluation due to technological equipment failure or disruption; Information transmitted may not be  sufficient (e.g. poor resolution of images) to allow for appropriate medical decision making by the Practitioner; and/or  In rare instances, security protocols could fail, causing a breach of personal health information.  Furthermore, I acknowledge that it is my responsibility to provide information about my medical history, conditions and care that is complete and accurate to the best of my ability. I acknowledge that Practitioner's advice, recommendations, and/or decision may be based on factors not within their control, such as incomplete or inaccurate data provided by me or distortions of diagnostic images or specimens that may result from electronic transmissions. I understand that the practice of medicine is not an exact science and that Practitioner makes no warranties or guarantees regarding treatment outcomes. I acknowledge that a copy of this consent can be made available to me via my patient portal [facility] MyChart), or I can request a printed copy by calling the office of [facility].    I understand that my insurance will be billed for this visit.   I have read or had this consent read to me. I understand the contents of this consent, which adequately explains the benefits and risks of the Services being provided via telemedicine.  I have been provided ample opportunity to ask questions regarding this consent and the Services and have had my questions answered to my satisfaction. I give my informed consent for the services to be provided through the use of telemedicine in my medical care   3

## 2023-06-08 ENCOUNTER — Encounter: Payer: Medicare Other | Admitting: Vascular Surgery

## 2023-06-12 NOTE — Progress Notes (Signed)
 Virtual Visit via Telephone Note   Because of Jaanvi Fizer co-morbid illnesses, she is at least at moderate risk for complications without adequate follow up.  This format is felt to be most appropriate for this patient at this time.  The patient did not have access to video technology/had technical difficulties with video requiring transitioning to audio format only (telephone).  All issues noted in this document were discussed and addressed.  No physical exam could be performed with this format.  Please refer to the patient's chart for her consent to telehealth for [facility]  Evaluation Performed:  Preoperative cardiovascular risk assessment _____________   Date:  06/12/2023   Patient ID:  Pema Thomure, DOB [birth year], MRN [id number] Patient Location:  Home Provider location:   Office  Primary Care Provider:  Claudene Pellet, MD Primary Cardiologist:  Candyce Reek, MD  Chief Complaint / Patient Profile   73 y.o. y/o female with a h/o elevated coronary calcium  noted on imaging, normal stress test, right subclavian steal, TIA, persistent cough 2/2 GERD, hypertension, hyperlipidemia, PAD, and lower extremity edema who is pending spinal cord stimulator battery replacement with Dr. Burnetta on 06/23/23 and presents today for telephonic preoperative cardiovascular risk assessment.  History of Present Illness    Monet North is a 73 y.o. female who presents via audio/video conferencing for a telehealth visit today.  Pt was last seen in cardiology clinic on 08/08/22 by Dr. Reek.  At that time Yulisa Chirico was doing well.  The patient is now pending procedure as outlined above. Since her last visit, she denies chest pain, shortness of breath, lower extremity edema, fatigue, palpitations, melena, hematuria, hemoptysis, diaphoresis, weakness, presyncope, syncope, orthopnea, and PND. She reports that her activity is somewhat limited by terrible balance issues  but she is able to achieve > 4 METS activity without concerning cardiac symptoms.  Past Medical History    Past Medical History:  Diagnosis Date   Anxiety    Arthritis    Asthma    Avascular necrosis of talus (HCC)    Cancer (HCC)    vulva pre cancer had surgery   Depression    Gait abnormality 03/20/2017   GERD (gastroesophageal reflux disease)    Hearing loss    very minor   History of pneumonia    Hyperlipidemia    Hypertension    Leg edema    left   Memory difficulty 03/20/2017   PONV (postoperative nausea and vomiting)    Stress incontinence    Past Surgical History:  Procedure Laterality Date   ANKLE FUSION  2011   right multiple    ANKLE FUSION     rear ankle fusion   ANKLE SURGERY  2010   right cordicompression   AORTIC ARCH ANGIOGRAPHY N/A 12/11/2017   Procedure: AORTIC ARCH ANGIOGRAPHY;  Surgeon: Harvey Carlin BRAVO, MD;  Location: [facility];  Service: Cardiovascular;  Laterality: N/A;   APPENDECTOMY  05/10/2016   BELOW KNEE LEG AMPUTATION     CATARACT EXTRACTION W/ INTRAOCULAR LENS IMPLANT Left    LAPAROSCOPIC APPENDECTOMY N/A 05/10/2016   Procedure: APPENDECTOMY LAPAROSCOPIC;  Surgeon: Vicenta Poli, MD;  Location: [facility] OR;  Service: General;  Laterality: N/A;   LUMBAR LAMINECTOMY/DECOMPRESSION MICRODISCECTOMY N/A 09/09/2015   Procedure:  L4-S1 Decompression/ Discetomy;  Surgeon: Donaciano Burnetta, MD;  Location: [facility] OR;  Service: Orthopedics;  Laterality: N/A;   SKIN GRAFT     SPINAL CORD STIMULATOR INSERTION N/A 01/10/2019   Procedure:  LUMBAR SPINAL CORD STIMULATOR INSERTION;  Surgeon: Burnetta Aures, MD;  Location: [facility];  Service: Orthopedics;  Laterality: N/A;  2.5 hrs   TONSILLECTOMY     TUBAL LIGATION  1983   VULVECTOMY N/A 01/28/2015   Procedure: WIDE EXCISION VULVECTOMY;  Surgeon: Hargis Paradise, MD;  Location: [facility];  Service: Gynecology;  Laterality: N/A;    Allergies  Allergies  Allergen Reactions   Dextromethorphan     Other Reaction(s):  cognitive impairment    Home Medications    Prior to Admission medications   Medication Sig Start Date End Date Taking? Authorizing Provider  acidophilus (RISAQUAD) CAPS capsule Take 1 capsule by mouth daily.    [provider]  albuterol  (PROAIR  HFA) 108 (90 Base) MCG/ACT inhaler 2 puffs every 4 hours as needed only  if your can't catch your breath 05/03/23   Darlean Ozell NOVAK, MD  B Complex-Folic Acid (B COMPLEX PLUS PO) 1 tablet    [provider]  benzonatate  (TESSALON ) 200 MG capsule Take 1 capsule (200 mg total) by mouth 3 (three) times daily as needed for cough. 05/03/23   Darlean Ozell NOVAK, MD  Biotin 5000 MCG TABS Take 10,000 mcg by mouth every evening.    [provider]  buPROPion  (WELLBUTRIN  XL) 300 MG 24 hr tablet Take 300 mg by mouth daily.    [provider]  Calcium  Carbonate (CALCIUM  600 PO) Take by mouth daily.    [provider]  chlorpheniramine (CHLOR-TRIMETON) 4 MG tablet Take 8 mg by mouth 2 (two) times a day.     [provider]  Cholecalciferol  (VITAMIN D -3) 125 MCG (5000 UT) TABS Take by mouth daily.    [provider]  Cyanocobalamin (B-12 PO) Take 5,000 mcg by mouth daily.     [provider]  denosumab  (PROLIA ) 60 MG/ML SOLN injection Inject 60 mg into the skin every 6 (six) months.     [provider]  FLUoxetine  (PROZAC ) 20 MG capsule Take 20 mg by mouth daily.    [provider]  losartan  (COZAAR ) 50 MG tablet Take 50 mg by mouth daily.  09/12/18   [provider]  MAGNESIUM  GLYCINATE PO Take by mouth.    [provider]  methocarbamol  (ROBAXIN ) 500 MG tablet Take 500 mg by mouth every 8 (eight) hours as needed. 06/10/19   [provider]  montelukast  (SINGULAIR ) 10 MG tablet TAKE 1 TABLET(10 MG) BY MOUTH DAILY 04/10/23   Wert, Michael B, MD  Multiple Vitamins-Minerals (CENTRUM SILVER PO) Take 1 tablet by mouth daily.    [provider]   pantoprazole  (PROTONIX ) 40 MG tablet Take 1 tablet (40 mg total) by mouth 2 (two) times daily. Take 30 TO 60 MINUTES BEFORE FIRST and LAST MEAL OF THE DAY. 08/08/22   Darlean Ozell NOVAK, MD  Potassium 99 MG TABS Take by mouth.    [provider]  rosuvastatin  (CRESTOR ) 10 MG tablet Take 10 mg by mouth every evening.  10/18/17   [provider]  Theanine 50 MG TBDP Take by mouth.    [provider]  traMADol  (ULTRAM ) 50 MG tablet Take 50 mg by mouth 3 (three) times daily as needed. 09/03/20   [provider]  triamcinolone  cream (KENALOG ) 0.1 % Apply 1 Application topically 2 (two) times daily. 04/27/23   [provider]  Turmeric (QC TUMERIC COMPLEX PO) Take 1,400 mg by mouth daily.    [provider]  valACYclovir (VALTREX) 1000 MG tablet  Take 1,000 mg by mouth daily as needed.  06/09/09   [provider]    Physical Exam    Vital Signs:  Brandey Vandalen does not have vital signs available for review today.  Given telephonic nature of communication, physical exam is limited. AAOx3. NAD. Normal affect.  Speech and respirations are unlabored.  Accessory Clinical Findings    None  Assessment & Plan    1.  Preoperative Cardiovascular Risk Assessment: According to the Revised Cardiac Risk Index (RCRI), her Perioperative Risk of Major Cardiac Event is (%): 0.9. Her Functional Capacity in METs is: 5.07 according to the Duke Activity Status Index (DASI). The patient is doing well from a cardiac perspective. Therefore, based on ACC/AHA guidelines, the patient would be at acceptable risk for the planned procedure without further cardiovascular testing.   The patient was advised that if she develops new symptoms prior to surgery to contact our office to arrange for a follow-up visit, and she verbalized understanding.  No request to hold cardiac medications.   A copy of this note will be routed to requesting surgeon.  Time:   Today, I  have spent 10 minutes with the patient with telehealth technology discussing medical history, symptoms, and management plan.    Rosaline EMERSON Bane, NP-C  06/13/2023, 9:26 AM [address] Office [phone] Fax [phone]

## 2023-06-13 ENCOUNTER — Encounter: Payer: Self-pay | Admitting: Nurse Practitioner

## 2023-06-13 ENCOUNTER — Ambulatory Visit: Payer: Medicare Other | Attending: Nurse Practitioner | Admitting: Nurse Practitioner

## 2023-06-13 DIAGNOSIS — Z01810 Encounter for preprocedural cardiovascular examination: Secondary | ICD-10-CM | POA: Diagnosis not present

## 2023-06-14 ENCOUNTER — Ambulatory Visit
Admission: RE | Admit: 2023-06-14 | Discharge: 2023-06-14 | Disposition: A | Discharge status: Home / Self Care | Payer: Medicare Other | Source: Ambulatory Visit | Attending: Family Medicine | Admitting: Family Medicine

## 2023-06-14 DIAGNOSIS — N6325 Unspecified lump in the left breast, overlapping quadrants: Secondary | ICD-10-CM | POA: Diagnosis not present

## 2023-06-14 DIAGNOSIS — N6320 Unspecified lump in the left breast, unspecified quadrant: Secondary | ICD-10-CM

## 2023-06-21 ENCOUNTER — Encounter: Payer: Self-pay | Admitting: Family

## 2023-06-21 ENCOUNTER — Ambulatory Visit (INDEPENDENT_AMBULATORY_CARE_PROVIDER_SITE_OTHER): Payer: Medicare Other | Admitting: Family

## 2023-06-21 DIAGNOSIS — Z89511 Acquired absence of right leg below knee: Secondary | ICD-10-CM

## 2023-06-21 NOTE — Progress Notes (Signed)
 Office Visit Note   Patient: Shelly [name]           Date of Birth: 16-Aug-1950           MRN: [id number] Visit Date: 06/21/2023              Requested by: Faustina Hood, MD [id number] [place],  [place] [address] PCP: Faustina Hood, MD  Chief Complaint  Patient presents with   Right Leg - Follow-up    HX right BKA       HPI: The patient is a 73 year old woman who presents today for evaluation of her right residual limb she is status post remote right below-knee amputation.  She reports her current liners are worn out and broken down and she is in need of new supplies for her prosthesis.  She reports that her current socket is fitting well.  No new skin issues.  Patient is an existing right transtibial  amputee.  Patient's current comorbidities are not expected to impact the ability to function with the prescribed prosthesis. Patient verbally communicates a strong desire to use a prosthesis. Patient currently requires mobility aids to ambulate without a prosthesis.  Expects not to use mobility aids with a new prosthesis.  Patient is a K3 level ambulator that spends a lot of time walking around on uneven terrain over obstacles, up and down stairs, and ambulates with a variable cadence.     Assessment & Plan: Visit Diagnoses: No diagnosis found.  Plan: Given an order for new prosthesis supplies.  She will follow-up as needed.  Follow-Up Instructions: Return if symptoms worsen or fail to improve.   Ortho Exam  Patient is alert, oriented, no adenopathy, well-dressed, normal affect, normal respiratory effort. On examination right residual limb well consolidated well-healed there is no impending skin breakdown  Imaging: No results found. No images are attached to the encounter.  Labs: Lab Results  Component Value Date   ESRSEDRATE 2 03/20/2017   ESRSEDRATE 10 09/08/2010     Lab Results  Component Value Date   ALBUMIN  4.2 04/01/2023   ALBUMIN   3.2 (L) 01/11/2019    Lab Results  Component Value Date   MG 1.7 02/12/2015   No results found for: "VD25OH"  No results found for: "PREALBUMIN"    Latest Ref Rng & Units 04/01/2023    6:47 PM 01/11/2019    2:32 PM 01/02/2019    1:55 PM  CBC EXTENDED  WBC 4.0 - 10.5 K/uL 8.0  10.3  8.7   RBC 3.87 - 5.11 MIL/uL 3.67  3.47  3.91   Hemoglobin 12.0 - 15.0 g/dL 78.2  95.6  21.3   HCT 36.0 - 46.0 % 35.5  34.0  37.8   Platelets 150 - 400 K/uL 286  309  308   NEUT# 1.7 - 7.7 K/uL  7.0    Lymph# 0.7 - 4.0 K/uL  2.1       There is no height or weight on file to calculate BMI.  Orders:  No orders of the defined types were placed in this encounter.  No orders of the defined types were placed in this encounter.    Procedures: No procedures performed  Clinical Data: No additional findings.  ROS:  All other systems negative, except as noted in the HPI. Review of Systems  Objective: Vital Signs: LMP  (LMP Unknown)   Specialty Comments:  No specialty comments available.  PMFS History: Patient Active Problem List   Diagnosis  Date Noted   Anxiety 05/19/2022   Acute bronchitis 04/11/2022   GERD (gastroesophageal reflux disease) 02/22/2022   Upper airway cough syndrome 03/26/2019   Chronic pain 01/10/2019   MCI (mild cognitive impairment) 03/20/2017   Gait abnormality 03/20/2017   History of right below knee amputation (HCC) 07/11/2016   Adenomatous colon polyp 05/10/2016   Coronary artery calcification 01/07/2016   Essential hypertension 01/07/2016   Back pain 09/09/2015   Abnormal CXR 05/16/2014   Cough variant asthma ? assoc with [facility] 05/15/2014   Acute post-operative pain 02/18/2012   Adiposity 02/17/2012   Does use eyeglasses 02/13/2012   H/O arthrodesis 10/21/2011   Fibromyalgia 04/11/2011   HLD (hyperlipidemia) 04/11/2011   Avascular necrosis of talus (HCC) 04/11/2011   Osteomyelitis of ankle (HCC) 04/11/2011   Leg edema 09/08/2010   Past Medical History:   Diagnosis Date   Anxiety    Arthritis    Asthma    Avascular necrosis of talus (HCC)    Cancer (HCC)    vulva pre cancer had surgery   Depression    Gait abnormality 03/20/2017   GERD (gastroesophageal reflux disease)    Hearing loss    "very minor"   History of pneumonia    Hyperlipidemia    Hypertension    Leg edema    left   Memory difficulty 03/20/2017   PONV (postoperative nausea and vomiting)    Stress incontinence     Family History  Problem Relation Age of Onset   Dementia Mother 57       alive   Fibromyalgia Mother    Allergies Mother    Heart attack Father 108       deceased   Multiple myeloma Father    Heart murmur Sister 41       she had open heart surgery   Other Sister 26       She is bIpolar- diabetic   Breast cancer Sister    Other Brother        alive   Heart disease Brother        emergent CABG for 99% blocked CAD    Past Surgical History:  Procedure Laterality Date   ANKLE FUSION  2011   right multiple    ANKLE FUSION     rear ankle fusion   ANKLE SURGERY  2010   right cordicompression   AORTIC ARCH ANGIOGRAPHY N/A 12/11/2017   Procedure: AORTIC ARCH ANGIOGRAPHY;  Surgeon: Richrd Char, MD;  Location: [facility];  Service: Cardiovascular;  Laterality: N/A;   APPENDECTOMY  05/10/2016   BELOW KNEE LEG AMPUTATION     CATARACT EXTRACTION W/ INTRAOCULAR LENS IMPLANT Left    LAPAROSCOPIC APPENDECTOMY N/A 05/10/2016   Procedure: APPENDECTOMY LAPAROSCOPIC;  Surgeon: Oza Blumenthal, MD;  Location: [facility] OR;  Service: General;  Laterality: N/A;   LUMBAR LAMINECTOMY/DECOMPRESSION MICRODISCECTOMY N/A 09/09/2015   Procedure:  L4-S1 Decompression/ Discetomy;  Surgeon: Mort Ards, MD;  Location: [facility] OR;  Service: Orthopedics;  Laterality: N/A;   SKIN GRAFT     SPINAL CORD STIMULATOR INSERTION N/A 01/10/2019   Procedure: LUMBAR SPINAL CORD STIMULATOR INSERTION;  Surgeon: Mort Ards, MD;  Location: [facility] OR;  Service: Orthopedics;  Laterality:  N/A;  2.5 hrs   TONSILLECTOMY     TUBAL LIGATION  1983   VULVECTOMY N/A 01/28/2015   Procedure: WIDE EXCISION VULVECTOMY;  Surgeon: Johnn Najjar, MD;  Location: [facility];  Service: Gynecology;  Laterality: N/A;   Social History  Occupational History   Occupation: Secretary/administrator: [employer]  Tobacco Use   Smoking status: Former    Current packs/day: 0.00    Average packs/day: 1 pack/day for 40.0 years (40.0 ttl pk-yrs)    Types: Cigarettes    Start date: 02/05/1967    Quit date: 02/05/2007    Years since quitting: 16.3   Smokeless tobacco: Never  Vaping Use   Vaping status: Never Used  Substance and Sexual Activity   Alcohol  use: Yes    Alcohol /week: 0.0 standard drinks of alcohol     Comment: socially   Drug use: No   Sexual activity: Not on file

## 2023-06-23 DIAGNOSIS — T85193A Other mechanical complication of implanted electronic neurostimulator, generator, initial encounter: Secondary | ICD-10-CM | POA: Diagnosis not present

## 2023-06-23 DIAGNOSIS — Z4542 Encounter for adjustment and management of neuropacemaker (brain) (peripheral nerve) (spinal cord): Secondary | ICD-10-CM | POA: Diagnosis not present

## 2023-06-23 HISTORY — PX: SPINAL CORD STIMULATOR BATTERY EXCHANGE: SHX6202

## 2023-06-25 ENCOUNTER — Encounter (HOSPITAL_BASED_OUTPATIENT_CLINIC_OR_DEPARTMENT_OTHER): Payer: Self-pay | Admitting: Emergency Medicine

## 2023-06-25 ENCOUNTER — Emergency Department (HOSPITAL_BASED_OUTPATIENT_CLINIC_OR_DEPARTMENT_OTHER)
Admission: EM | Admit: 2023-06-25 | Discharge: 2023-06-25 | Disposition: A | Discharge status: Home / Self Care | Payer: Medicare Other | Attending: Emergency Medicine | Admitting: Emergency Medicine

## 2023-06-25 ENCOUNTER — Emergency Department (HOSPITAL_BASED_OUTPATIENT_CLINIC_OR_DEPARTMENT_OTHER): Payer: Medicare Other

## 2023-06-25 ENCOUNTER — Emergency Department (HOSPITAL_BASED_OUTPATIENT_CLINIC_OR_DEPARTMENT_OTHER): Payer: Medicare Other | Admitting: Radiology

## 2023-06-25 DIAGNOSIS — M5136 Other intervertebral disc degeneration, lumbar region with discogenic back pain only: Secondary | ICD-10-CM | POA: Diagnosis not present

## 2023-06-25 DIAGNOSIS — M542 Cervicalgia: Secondary | ICD-10-CM | POA: Diagnosis not present

## 2023-06-25 DIAGNOSIS — R519 Headache, unspecified: Secondary | ICD-10-CM | POA: Diagnosis present

## 2023-06-25 DIAGNOSIS — M47814 Spondylosis without myelopathy or radiculopathy, thoracic region: Secondary | ICD-10-CM | POA: Diagnosis not present

## 2023-06-25 DIAGNOSIS — W01198A Fall on same level from slipping, tripping and stumbling with subsequent striking against other object, initial encounter: Secondary | ICD-10-CM | POA: Insufficient documentation

## 2023-06-25 DIAGNOSIS — M160 Bilateral primary osteoarthritis of hip: Secondary | ICD-10-CM | POA: Diagnosis not present

## 2023-06-25 DIAGNOSIS — W19XXXA Unspecified fall, initial encounter: Secondary | ICD-10-CM

## 2023-06-25 DIAGNOSIS — S01111A Laceration without foreign body of right eyelid and periocular area, initial encounter: Secondary | ICD-10-CM | POA: Insufficient documentation

## 2023-06-25 DIAGNOSIS — R918 Other nonspecific abnormal finding of lung field: Secondary | ICD-10-CM | POA: Diagnosis not present

## 2023-06-25 DIAGNOSIS — Z043 Encounter for examination and observation following other accident: Secondary | ICD-10-CM | POA: Diagnosis not present

## 2023-06-25 DIAGNOSIS — M47816 Spondylosis without myelopathy or radiculopathy, lumbar region: Secondary | ICD-10-CM | POA: Diagnosis not present

## 2023-06-25 DIAGNOSIS — M25551 Pain in right hip: Secondary | ICD-10-CM | POA: Diagnosis not present

## 2023-06-25 DIAGNOSIS — M545 Low back pain, unspecified: Secondary | ICD-10-CM | POA: Insufficient documentation

## 2023-06-25 DIAGNOSIS — Z23 Encounter for immunization: Secondary | ICD-10-CM | POA: Insufficient documentation

## 2023-06-25 DIAGNOSIS — I700 Atherosclerosis of aorta: Secondary | ICD-10-CM | POA: Diagnosis not present

## 2023-06-25 DIAGNOSIS — M5134 Other intervertebral disc degeneration, thoracic region: Secondary | ICD-10-CM | POA: Diagnosis not present

## 2023-06-25 DIAGNOSIS — S0990XA Unspecified injury of head, initial encounter: Secondary | ICD-10-CM | POA: Diagnosis not present

## 2023-06-25 DIAGNOSIS — M25511 Pain in right shoulder: Secondary | ICD-10-CM | POA: Diagnosis not present

## 2023-06-25 DIAGNOSIS — M546 Pain in thoracic spine: Secondary | ICD-10-CM | POA: Insufficient documentation

## 2023-06-25 DIAGNOSIS — S199XXA Unspecified injury of neck, initial encounter: Secondary | ICD-10-CM | POA: Diagnosis not present

## 2023-06-25 DIAGNOSIS — I10 Essential (primary) hypertension: Secondary | ICD-10-CM | POA: Diagnosis not present

## 2023-06-25 DIAGNOSIS — M25519 Pain in unspecified shoulder: Secondary | ICD-10-CM | POA: Diagnosis not present

## 2023-06-25 MED ORDER — TETANUS-DIPHTH-ACELL PERTUSSIS 5-2.5-18.5 LF-MCG/0.5 IM SUSY
0.5000 mL | PREFILLED_SYRINGE | Freq: Once | INTRAMUSCULAR | Status: AC
  Administered 2023-06-25: 0.5 mL via INTRAMUSCULAR
  Filled 2023-06-25: qty 0.5

## 2023-06-25 MED ORDER — OXYCODONE HCL 5 MG PO TABS
5.0000 mg | ORAL_TABLET | Freq: Once | ORAL | Status: AC
  Administered 2023-06-25: 5 mg via ORAL
  Filled 2023-06-25: qty 1

## 2023-06-25 MED ORDER — BACITRACIN ZINC 500 UNIT/GM EX OINT: TOPICAL_OINTMENT | Freq: Two times a day (BID) | CUTANEOUS | Status: DC

## 2023-06-25 MED ORDER — FENTANYL CITRATE PF 50 MCG/ML IJ SOSY: 25.0000 ug | PREFILLED_SYRINGE | Freq: Once | INTRAMUSCULAR | Status: DC

## 2023-06-25 MED ORDER — LIDOCAINE HCL (PF) 1 % IJ SOLN
5.0000 mL | Freq: Once | INTRAMUSCULAR | Status: AC
  Administered 2023-06-25: 5 mL
  Filled 2023-06-25: qty 5

## 2023-06-25 MED ORDER — OXYCODONE HCL 5 MG PO TABS: 5.0000 mg | ORAL_TABLET | ORAL | 0 refills | Status: DC | PRN

## 2023-06-25 NOTE — ED Triage Notes (Signed)
Fall lost balance Head head on cabinet No loc. Lac on right forehead, right shoulder pain Midline neck tenderness No blood thinner. Ccollar by EMS  Ems From home, fall hit r head on stove landed on right side Ems VS 168/96 86 18 96% RA

## 2023-06-25 NOTE — ED Notes (Signed)
Pt's wound irrigated, wet gauze placed on top, awaiting sutures

## 2023-06-25 NOTE — Discharge Instructions (Addendum)
You were evaluated in the emergency room following a fall.  Your imaging did not show any significant abnormality.  Your laceration was sutured closed.  Please return to the emergency room in 7 to 10 days for suture removal.  If you experience any new or worsening symptoms including fevers, chills, wound drainage or redness, dizziness, blurry vision or vomiting please return to the emergency room.

## 2023-06-25 NOTE — ED Provider Notes (Signed)
[place] [facility] AT [facility] Provider Note   CSN: [id number] Arrival date & time: 06/25/23  1432     History  Chief Complaint  Patient presents with   Fall   Head Laceration    Shelly [name] is a 73 y.o. female who presents following mechanical fall standing height.  States she lost her balance fell hitting her head and right shoulder on the way down.  Denies any preceding dizziness, chest pain or shortness of breath.  She did not lose consciousness she is not on blood thinners.  Has multiple points of tenderness including right shoulder, right hip, cervical, thoracic and lumbar spine.  She arrived via EMS and is maintained in a c-collar.  No nausea or vomiting, no blurry vision or extremity weakness or numbness.   Fall  Head Laceration       Home Medications Prior to Admission medications   Medication Sig Start Date End Date Taking? Authorizing Provider  oxyCODONE (ROXICODONE) 5 MG immediate release tablet Take 1 tablet (5 mg total) by mouth every 4 (four) hours as needed for severe pain (pain score 7-10). 06/25/23  Yes Halford Decamp, PA-C  acidophilus (RISAQUAD) CAPS capsule Take 1 capsule by mouth daily.    [provider]  albuterol (PROAIR HFA) 108 (90 Base) MCG/ACT inhaler 2 puffs every 4 hours as needed only  if your can't catch your breath 05/03/23   Nyoka Cowden, MD  B Complex-Folic Acid (B COMPLEX PLUS PO) 1 tablet    [provider]  benzonatate (TESSALON) 200 MG capsule Take 1 capsule (200 mg total) by mouth 3 (three) times daily as needed for cough. 05/03/23   Nyoka Cowden, MD  Biotin 5000 MCG TABS Take 10,000 mcg by mouth every evening.    [provider]  buPROPion (WELLBUTRIN XL) 300 MG 24 hr tablet Take 300 mg by mouth daily.    [provider]  Calcium Carbonate (CALCIUM 600 PO) Take by mouth daily.    [provider]  chlorpheniramine (CHLOR-TRIMETON) 4 MG tablet Take 8 mg by  mouth 2 (two) times a day.     [provider]  Cholecalciferol (VITAMIN D-3) 125 MCG (5000 UT) TABS Take by mouth daily.    [provider]  Cyanocobalamin (B-12 PO) Take 5,000 mcg by mouth daily.     [provider]  denosumab (PROLIA) 60 MG/ML SOLN injection Inject 60 mg into the skin every 6 (six) months.     [provider]  FLUoxetine (PROZAC) 20 MG capsule Take 20 mg by mouth daily.    [provider]  losartan (COZAAR) 50 MG tablet Take 50 mg by mouth daily.  09/12/18   [provider]  MAGNESIUM GLYCINATE PO Take by mouth.    [provider]  methocarbamol (ROBAXIN) 500 MG tablet Take 500 mg by mouth every 8 (eight) hours as needed. 06/10/19   [provider]  montelukast (SINGULAIR) 10 MG tablet TAKE 1 TABLET(10 MG) BY MOUTH DAILY 04/10/23   Nyoka Cowden, MD  Multiple Vitamins-Minerals (CENTRUM SILVER PO) Take 1 tablet by mouth daily.    [provider]  pantoprazole (PROTONIX) 40 MG tablet Take 1 tablet (40 mg total) by mouth 2 (two) times daily. Take 30 TO 60 MINUTES BEFORE FIRST and LAST MEAL OF THE DAY. 08/08/22   Nyoka Cowden, MD  Potassium 99 MG TABS Take by mouth.    [provider]  rosuvastatin (CRESTOR) 10  MG tablet Take 10 mg by mouth every evening.  10/18/17   [provider]  Theanine 50 MG TBDP Take by mouth.    [provider]  traMADol (ULTRAM) 50 MG tablet Take 50 mg by mouth 3 (three) times daily as needed. 09/03/20   [provider]  triamcinolone cream (KENALOG) 0.1 % Apply 1 Application topically 2 (two) times daily. 04/27/23   [provider]  Turmeric (QC TUMERIC COMPLEX PO) Take 1,400 mg by mouth daily.    [provider]  valACYclovir (VALTREX) 1000 MG tablet Take 1,000 mg by mouth daily as needed.  06/09/09   [provider]      Allergies    Dextromethorphan    Review of Systems   Review of Systems  Musculoskeletal:   Positive for arthralgias and myalgias.  Skin:  Positive for wound.    Physical Exam Updated Vital Signs BP (!) 151/78   Pulse 92   Temp 98.7 F (37.1 C) (Oral)   Resp 18   LMP  (LMP Unknown)   SpO2 91%  Physical Exam Vitals and nursing note reviewed.  Constitutional:      General: She is not in acute distress.    Appearance: She is well-developed.  HENT:     Head:     Comments: Obvious head lac above right eyebrow, hemostasis achieved, no obvious retained foreign body, sensation is intact Eyes:     Conjunctiva/sclera: Conjunctivae normal.  Cardiovascular:     Rate and Rhythm: Normal rate and regular rhythm.     Heart sounds: No murmur heard. Pulmonary:     Effort: Pulmonary effort is normal. No respiratory distress.     Breath sounds: Normal breath sounds.  Abdominal:     Palpations: Abdomen is soft.     Tenderness: There is no abdominal tenderness.  Musculoskeletal:        General: No swelling.     Cervical back: Neck supple.  Skin:    General: Skin is warm and dry.     Capillary Refill: Capillary refill takes less than 2 seconds.  Neurological:     General: No focal deficit present.     Mental Status: She is alert and oriented to person, place, and time. Mental status is at baseline.     Cranial Nerves: No cranial nerve deficit.     Sensory: No sensory deficit.     Motor: No weakness.     Coordination: Coordination normal.     Comments: No abnormal speech or aphasia, no facial droop  Psychiatric:        Mood and Affect: Mood normal.     ED Results / Procedures / Treatments   Labs (all labs ordered are listed, but only abnormal results are displayed) Labs Reviewed - No data to display  EKG None  Radiology DG Shoulder Right Result Date: 06/25/2023 CLINICAL DATA:  Fall with right shoulder pain. EXAM: RIGHT SHOULDER - 2+ VIEW COMPARISON:  Same day chest radiograph. FINDINGS: There is no evidence of fracture or dislocation. There is mild acromioclavicular  joint osteoarthritis. Soft tissues are unremarkable. IMPRESSION: No acute osseous injury. Electronically Signed   By: Romona Curls M.D.   On: 06/25/2023 16:20   DG Chest 1 View Result Date: 06/25/2023 CLINICAL DATA:  Fall with right shoulder pain. EXAM: CHEST  1 VIEW COMPARISON:  Chest radiograph dated 05/03/2023. FINDINGS: The heart size and mediastinal contours are within normal limits. There is mild bilateral interstitial opacities. No pleural effusion or  pneumothorax. The visualized skeletal structures are unremarkable. Stimulator leads overlie the thoracic spine. IMPRESSION: Mild bilateral interstitial opacities may reflect pulmonary edema. Electronically Signed   By: Romona Curls M.D.   On: 06/25/2023 16:19   DG Hip Unilat W or Wo Pelvis 2-3 Views Right Result Date: 06/25/2023 CLINICAL DATA:  Fall with right hip pain. EXAM: DG HIP (WITH OR WITHOUT PELVIS) 2-3V RIGHT COMPARISON:  CT abdomen pelvis dated 01/11/2019. FINDINGS: There is no evidence of hip fracture or dislocation. Mild bilateral hip osteoarthritis. Degenerative changes are seen in the spine. An electronic device overlies the left pelvis. IMPRESSION: No acute fracture or dislocation. Electronically Signed   By: Romona Curls M.D.   On: 06/25/2023 16:17   CT LUMBAR SPINE WO CONTRAST Result Date: 06/25/2023 CLINICAL DATA:  Fall, pain EXAM: CT LUMBAR SPINE WITHOUT CONTRAST TECHNIQUE: Multidetector CT imaging of the lumbar spine was performed without intravenous contrast administration. Multiplanar CT image reconstructions were also generated. RADIATION DOSE REDUCTION: This exam was performed according to the departmental dose-optimization program which includes automated exposure control, adjustment of the mA and/or kV according to patient size and/or use of iterative reconstruction technique. COMPARISON:  01/11/2019 FINDINGS: Segmentation: 5 lumbar type vertebrae. Alignment: Mild dextrocurvature. Straightening of the cervical lordosis.  Facet joints remain aligned. No traumatic listhesis. Vertebrae: No acute fracture or focal pathologic process. Paraspinal and other soft tissues: Aortic atherosclerosis. Colonic diverticulosis. No acute abnormality. Disc levels: Severe multilevel degenerative disc disease and facet arthropathy throughout the lumbar spine, progressed since 2020. IMPRESSION: 1. No acute fracture or traumatic listhesis of the lumbar spine. 2. Severe multilevel degenerative disc disease and facet arthropathy throughout the lumbar spine, progressed since 2020. 3. Aortic atherosclerosis (ICD10-I70.0). Electronically Signed   By: Duanne Guess D.O.   On: 06/25/2023 16:01   CT Head Wo Contrast Result Date: 06/25/2023 CLINICAL DATA:  Poly trauma from fall with right forehead laceration. Neck pain. EXAM: CT HEAD WITHOUT CONTRAST CT CERVICAL SPINE WITHOUT CONTRAST TECHNIQUE: Multidetector CT imaging of the head and cervical spine was performed following the standard protocol without intravenous contrast. Multiplanar CT image reconstructions of the cervical spine were also generated. RADIATION DOSE REDUCTION: This exam was performed according to the departmental dose-optimization program which includes automated exposure control, adjustment of the mA and/or kV according to patient size and/or use of iterative reconstruction technique. COMPARISON:  07/10/2020 FINDINGS: CT HEAD FINDINGS Brain: Ventricles, cisterns and other CSF spaces are within normal. There is mild chronic ischemic microvascular disease. There is no mass, mass effect, shift of midline structures or acute hemorrhage. No acute infarction. Vascular: No hyperdense vessel or unexpected calcification. Skull: Normal. Negative for fracture or focal lesion. Sinuses/Orbits: No acute finding. Other: None. CT CERVICAL SPINE FINDINGS Alignment: No posttraumatic subluxation. Minimal degenerative 1-2 mm anterior subluxation of C4 on C5 unchanged. Skull base and vertebrae: Moderate  spondylosis throughout the cervical spine to include uncovertebral joint spurring and facet arthropathy. Vertebral body heights are maintained. Atlantoaxial articulation is unremarkable. No acute fracture. Severe right-sided neural foraminal narrowing at the C2-3 level. Moderate to severe bilateral neural foraminal narrowing at the C3-4 level with moderate bilateral neural foraminal narrowing at the C4-5 level right worse than left. Mild bilateral neural foraminal narrowing at the C5-6 level and C6-7 levels. Right-sided neural foraminal narrowing at the C7-T1 level. Soft tissues and spinal canal: No prevertebral fluid or swelling. No visible canal hematoma. Disc levels: Moderate disc space narrowing at the C5-6, C6-7 and C7-T1 levels. Upper chest: No acute findings.  Other: None. IMPRESSION: CT HEAD: No acute intracranial findings. Mild chronic ischemic microvascular disease. CT CERVICAL SPINE: 1. No acute fracture or posttraumatic subluxation. 2. Moderate spondylosis throughout the cervical spine with significant multilevel neural foraminal narrowing as described. Electronically Signed   By: Elberta Fortis M.D.   On: 06/25/2023 16:00   CT Cervical Spine Wo Contrast Result Date: 06/25/2023 CLINICAL DATA:  Poly trauma from fall with right forehead laceration. Neck pain. EXAM: CT HEAD WITHOUT CONTRAST CT CERVICAL SPINE WITHOUT CONTRAST TECHNIQUE: Multidetector CT imaging of the head and cervical spine was performed following the standard protocol without intravenous contrast. Multiplanar CT image reconstructions of the cervical spine were also generated. RADIATION DOSE REDUCTION: This exam was performed according to the departmental dose-optimization program which includes automated exposure control, adjustment of the mA and/or kV according to patient size and/or use of iterative reconstruction technique. COMPARISON:  07/10/2020 FINDINGS: CT HEAD FINDINGS Brain: Ventricles, cisterns and other CSF spaces are within  normal. There is mild chronic ischemic microvascular disease. There is no mass, mass effect, shift of midline structures or acute hemorrhage. No acute infarction. Vascular: No hyperdense vessel or unexpected calcification. Skull: Normal. Negative for fracture or focal lesion. Sinuses/Orbits: No acute finding. Other: None. CT CERVICAL SPINE FINDINGS Alignment: No posttraumatic subluxation. Minimal degenerative 1-2 mm anterior subluxation of C4 on C5 unchanged. Skull base and vertebrae: Moderate spondylosis throughout the cervical spine to include uncovertebral joint spurring and facet arthropathy. Vertebral body heights are maintained. Atlantoaxial articulation is unremarkable. No acute fracture. Severe right-sided neural foraminal narrowing at the C2-3 level. Moderate to severe bilateral neural foraminal narrowing at the C3-4 level with moderate bilateral neural foraminal narrowing at the C4-5 level right worse than left. Mild bilateral neural foraminal narrowing at the C5-6 level and C6-7 levels. Right-sided neural foraminal narrowing at the C7-T1 level. Soft tissues and spinal canal: No prevertebral fluid or swelling. No visible canal hematoma. Disc levels: Moderate disc space narrowing at the C5-6, C6-7 and C7-T1 levels. Upper chest: No acute findings. Other: None. IMPRESSION: CT HEAD: No acute intracranial findings. Mild chronic ischemic microvascular disease. CT CERVICAL SPINE: 1. No acute fracture or posttraumatic subluxation. 2. Moderate spondylosis throughout the cervical spine with significant multilevel neural foraminal narrowing as described. Electronically Signed   By: Elberta Fortis M.D.   On: 06/25/2023 16:00   CT THORACIC SPINE WO CONTRAST Result Date: 06/25/2023 CLINICAL DATA:  Fall, pain EXAM: CT THORACIC SPINE WITHOUT CONTRAST TECHNIQUE: Multidetector CT images of the thoracic were obtained using the standard protocol without intravenous contrast. RADIATION DOSE REDUCTION: This exam was performed  according to the departmental dose-optimization program which includes automated exposure control, adjustment of the mA and/or kV according to patient size and/or use of iterative reconstruction technique. COMPARISON:  01/11/2019 FINDINGS: Alignment: Facet joints are aligned without traumatic listhesis. Vertebrae: No acute fracture or focal pathologic process. Paraspinal and other soft tissues: Aortic atherosclerosis. Atelectatic changes in the included lung fields. No posterior paraspinal abnormality. Disc levels: Advanced degenerative disc disease within the lower thoracic spine at the T8-9 through T11-12 levels. Lower thoracic facet arthropathy. Thoracic spinal stimulator leads are positioned at the T7 to T10 levels. IMPRESSION: 1. No acute fracture or traumatic listhesis of the thoracic spine. 2. Advanced degenerative disc disease within the lower thoracic spine. 3. Thoracic spinal stimulator leads are positioned at the T7 to T10 levels. 4. Aortic atherosclerosis (ICD10-I70.0). Electronically Signed   By: Duanne Guess D.O.   On: 06/25/2023 15:56    Procedures .  Laceration Repair  Date/Time: 06/25/2023 6:19 PM  Performed by: Halford Decamp, PA-C Authorized by: Halford Decamp, PA-C   Consent:    Consent obtained:  Verbal   Consent given by:  Patient   Risks, benefits, and alternatives were discussed: yes     Risks discussed:  Infection and pain   Alternatives discussed:  No treatment Universal protocol:    Procedure explained and questions answered to patient or proxy's satisfaction: yes     Patient identity confirmed:  Verbally with patient and arm band Anesthesia:    Anesthesia method:  Local infiltration   Local anesthetic:  Lidocaine 1% w/o epi Laceration details:    Location:  Face   Face location:  R eyebrow   Length (cm):  4 Exploration:    Wound exploration: wound explored through full range of motion   Treatment:    Area cleansed with:  Povidone-iodine   Irrigation  solution:  Sterile saline Skin repair:    Repair method:  Sutures   Suture size:  5-0   Suture material:  Prolene   Suture technique:  Simple interrupted   Number of sutures:  8 Post-procedure details:    Procedure completion:  Tolerated     Medications Ordered in ED Medications  bacitracin ointment (has no administration in time range)  Tdap (BOOSTRIX) injection 0.5 mL (0.5 mLs Intramuscular Given 06/25/23 1613)  lidocaine (PF) (XYLOCAINE) 1 % injection 5 mL (5 mLs Infiltration Given by Other 06/25/23 1615)  oxyCODONE (Oxy IR/ROXICODONE) immediate release tablet 5 mg (5 mg Oral Given 06/25/23 1635)    ED Course/ Medical Decision Making/ A&P                                 Medical Decision Making Amount and/or Complexity of Data Reviewed Radiology: ordered.  Risk Prescription drug management.   This patient presents to the ED with chief complaint(s) of fall.  The complaint involves an extensive differential diagnosis and also carries with it a high risk of complications and morbidity.   pertinent past medical history as listed in HPI  The differential diagnosis includes  Intercranial hemorrhage, fracture, dislocation, sprain, contusion The initial plan is to  Will obtain imaging and pain control Additional history obtained: Additional history obtained from family Records reviewed previous admission documents and Care Everywhere/External Records  Initial Assessment:   Hemodynamically stable patient presenting following mechanical fall.  She has a obvious lac above her right eyebrow, hemostasis is achieved.  No preceding symptoms to suggest medical pathology causing mechanical fall.  Neuroexam is without focal deficit.  She has mild tenderness along thoracic and lumbar spine most notably along cervical spine.  She will remain in C-spine collar until cleared with imaging.  She has tenderness to lateral shoulder, no obvious deformity, but lacks full range of motion.  Mild  tenderness to right lateral hip as well.  Independent ECG interpretation:  None  Independent labs interpretation:  The following labs were independently interpreted:  none  Independent visualization and interpretation of imaging: I independently visualized the following imaging with scope of interpretation limited to determining acute life threatening conditions related to emergency care:  CT thoracic/lumbar/cervical spine: Multilevel degenerative changes without acute abnormality, thoracic spinal stimulator with leads in place Shoulder x-ray: No acute osseous abnormality Chest x-ray: Mild bilateral interstitial opacities, may represent pulmonary edema, no osseous abnormality Right hip/pelvis: No acute osseous abnormality  Treatment and Reassessment: Patient given  oxycodone 5 following first assessment Patient given tetanus booster  Consultations obtained:   None  Disposition:   Patient discharged home.  Provided short supply of oxycodone.  Return in 7 to 10 days for suture removal. The patient has been appropriately medically screened and/or stabilized in the ED. I have low suspicion for any other emergent medical condition which would require further screening, evaluation or treatment in the ED or require inpatient management. At time of discharge the patient is hemodynamically stable and in no acute distress. I have discussed work-up results and diagnosis with patient and answered all questions. Patient is agreeable with discharge plan. We discussed strict return precautions for returning to the emergency department and they verbalized understanding.     Social Determinants of Health:   none  This note was dictated with voice recognition software.  Despite best efforts at proofreading, errors may have occurred which can change the documentation meaning.          Final Clinical Impression(s) / ED Diagnoses Final diagnoses:  Eyebrow laceration, right, initial encounter   Fall, initial encounter  Acute pain of right shoulder    Rx / DC Orders ED Discharge Orders          Ordered    CT THORACIC SPINE WO CONTRAST        06/25/23 1534    CT LUMBAR SPINE WO CONTRAST        06/25/23 1534    oxyCODONE (ROXICODONE) 5 MG immediate release tablet  Every 4 hours PRN        06/25/23 1741              Halford Decamp, PA-C 06/25/23 1822    Anders Simmonds T, DO 06/28/23 [phone]

## 2023-06-26 ENCOUNTER — Telehealth: Payer: Self-pay | Admitting: Neurology

## 2023-06-26 NOTE — Telephone Encounter (Signed)
Called to confirm appointment details

## 2023-06-27 DIAGNOSIS — S01111A Laceration without foreign body of right eyelid and periocular area, initial encounter: Secondary | ICD-10-CM | POA: Diagnosis not present

## 2023-07-10 DIAGNOSIS — R42 Dizziness and giddiness: Secondary | ICD-10-CM | POA: Diagnosis not present

## 2023-07-10 DIAGNOSIS — R410 Disorientation, unspecified: Secondary | ICD-10-CM | POA: Diagnosis not present

## 2023-07-10 DIAGNOSIS — R399 Unspecified symptoms and signs involving the genitourinary system: Secondary | ICD-10-CM | POA: Diagnosis not present

## 2023-07-10 DIAGNOSIS — N3000 Acute cystitis without hematuria: Secondary | ICD-10-CM | POA: Diagnosis not present

## 2023-07-10 DIAGNOSIS — B001 Herpesviral vesicular dermatitis: Secondary | ICD-10-CM | POA: Diagnosis not present

## 2023-07-10 NOTE — Telephone Encounter (Signed)
Received call from [facility] patient was scheduled for testing today, patient cancelled and rescheduled. Pt is rescheduled for 07/20/23 at 9AM for testing and 08/01/23 at 1:00 for Feedback.

## 2023-07-13 ENCOUNTER — Encounter: Payer: Medicare Other | Admitting: Vascular Surgery

## 2023-07-15 ENCOUNTER — Other Ambulatory Visit: Payer: Self-pay | Admitting: Internal Medicine

## 2023-07-15 DIAGNOSIS — J45991 Cough variant asthma: Secondary | ICD-10-CM

## 2023-07-18 DIAGNOSIS — M51362 Other intervertebral disc degeneration, lumbar region with discogenic back pain and lower extremity pain: Secondary | ICD-10-CM | POA: Diagnosis not present

## 2023-07-18 DIAGNOSIS — M545 Low back pain, unspecified: Secondary | ICD-10-CM | POA: Diagnosis not present

## 2023-07-18 DIAGNOSIS — M5412 Radiculopathy, cervical region: Secondary | ICD-10-CM | POA: Diagnosis not present

## 2023-07-18 DIAGNOSIS — Z79899 Other long term (current) drug therapy: Secondary | ICD-10-CM | POA: Diagnosis not present

## 2023-07-18 DIAGNOSIS — Z5181 Encounter for therapeutic drug level monitoring: Secondary | ICD-10-CM | POA: Diagnosis not present

## 2023-07-18 DIAGNOSIS — M79605 Pain in left leg: Secondary | ICD-10-CM | POA: Diagnosis not present

## 2023-07-20 DIAGNOSIS — F32A Depression, unspecified: Secondary | ICD-10-CM | POA: Diagnosis not present

## 2023-07-20 DIAGNOSIS — F419 Anxiety disorder, unspecified: Secondary | ICD-10-CM | POA: Diagnosis not present

## 2023-07-22 ENCOUNTER — Other Ambulatory Visit: Payer: Self-pay | Admitting: Internal Medicine

## 2023-07-24 NOTE — Progress Notes (Deleted)
 Office Note     CC:  Pain in left lower leg. No DVT Requesting Provider:  Pincus Badder, FNP  HPI: Shelly [name] is a 73 y.o. (Feb 13, 1951) female presenting at the request of .[name], MD ***  The pt is *** on a statin for cholesterol management.  The pt is *** on a daily aspirin.   Other AC:  *** The pt is *** on medication for hypertension.   The pt is *** diabetic.  Tobacco hx:  ***  Past Medical History:  Diagnosis Date   Anxiety    Arthritis    Asthma    Avascular necrosis of talus (HCC)    Cancer (HCC)    vulva pre cancer had surgery   Depression    Gait abnormality 03/20/2017   GERD (gastroesophageal reflux disease)    Hearing loss    "very minor"   History of pneumonia    Hyperlipidemia    Hypertension    Leg edema    left   Memory difficulty 03/20/2017   PONV (postoperative nausea and vomiting)    Stress incontinence     Past Surgical History:  Procedure Laterality Date   ANKLE FUSION  2011   right multiple    ANKLE FUSION     rear ankle fusion   ANKLE SURGERY  2010   right cordicompression   AORTIC ARCH ANGIOGRAPHY N/A 12/11/2017   Procedure: AORTIC ARCH ANGIOGRAPHY;  Surgeon: [name], MD;  Location: [facility];  Service: Cardiovascular;  Laterality: N/A;   APPENDECTOMY  05/10/2016   BELOW KNEE LEG AMPUTATION     CATARACT EXTRACTION W/ INTRAOCULAR LENS IMPLANT Left    LAPAROSCOPIC APPENDECTOMY N/A 05/10/2016   Procedure: APPENDECTOMY LAPAROSCOPIC;  Surgeon: [name], MD;  Location: [facility];  Service: General;  Laterality: N/A;   LUMBAR LAMINECTOMY/DECOMPRESSION MICRODISCECTOMY N/A 09/09/2015   Procedure:  L4-S1 Decompression/ Discetomy;  Surgeon: [name], MD;  Location: [facility];  Service: Orthopedics;  Laterality: N/A;   SKIN GRAFT     SPINAL CORD STIMULATOR INSERTION N/A 01/10/2019   Procedure: LUMBAR SPINAL CORD STIMULATOR INSERTION;  Surgeon: [name], MD;  Location: [facility];  Service: Orthopedics;   Laterality: N/A;  2.5 hrs   TONSILLECTOMY     TUBAL LIGATION  1983   VULVECTOMY N/A 01/28/2015   Procedure: WIDE EXCISION VULVECTOMY;  Surgeon: [name], MD;  Location: [facility];  Service: Gynecology;  Laterality: N/A;    Social History   Socioeconomic History   Marital status: Divorced    Spouse name: Not on file   Number of children: 3   Years of education: 14   Highest education level: Not on file  Occupational History   Occupation: ACCOUNTING    Employer: [employer]  Tobacco Use   Smoking status: Former    Current packs/day: 0.00    Average packs/day: 1 pack/day for 40.0 years (40.0 ttl pk-yrs)    Types: Cigarettes    Start date: 02/05/1967    Quit date: 02/05/2007    Years since quitting: 16.4   Smokeless tobacco: Never  Vaping Use   Vaping status: Never Used  Substance and Sexual Activity   Alcohol use: Yes    Alcohol/week: 0.0 standard drinks of alcohol    Comment: socially   Drug use: No   Sexual activity: Not on file  Other Topics Concern   Not on file  Social History Narrative   Lives alone   Caffeine use: Tea every  morning    Right handed    Social Drivers of Health   Financial Resource Strain: Not on file  Food Insecurity: Not on file  Transportation Needs: Not on file  Physical Activity: Not on file  Stress: Not on file  Social Connections: Not on file  Intimate Partner Violence: Not on file   *** Family History  Problem Relation Age of Onset   Dementia Mother 44       alive   Fibromyalgia Mother    Allergies Mother    Heart attack Father 37       deceased   Multiple myeloma Father    Heart murmur Sister 86       she had open heart surgery   Other Sister 72       She is bIpolar- diabetic   Breast cancer Sister    Other Brother        alive   Heart disease Brother        emergent CABG for 99% blocked CAD    Current Outpatient Medications  Medication Sig Dispense Refill   acidophilus (RISAQUAD) CAPS capsule Take 1 capsule by  mouth daily.     albuterol (PROAIR HFA) 108 (90 Base) MCG/ACT inhaler 2 puffs every 4 hours as needed only  if your can't catch your breath 18 g 11   B Complex-Folic Acid (B COMPLEX PLUS PO) 1 tablet     benzonatate (TESSALON) 200 MG capsule TAKE 1 CAPSULE(200 MG) BY MOUTH THREE TIMES DAILY AS NEEDED FOR COUGH 30 capsule 1   Biotin 5000 MCG TABS Take 10,000 mcg by mouth every evening.     buPROPion (WELLBUTRIN XL) 300 MG 24 hr tablet Take 300 mg by mouth daily.     Calcium Carbonate (CALCIUM 600 PO) Take by mouth daily.     chlorpheniramine (CHLOR-TRIMETON) 4 MG tablet Take 8 mg by mouth 2 (two) times a day.      Cholecalciferol (VITAMIN D-3) 125 MCG (5000 UT) TABS Take by mouth daily.     Cyanocobalamin (B-12 PO) Take 5,000 mcg by mouth daily.      denosumab (PROLIA) 60 MG/ML SOLN injection Inject 60 mg into the skin every 6 (six) months.      FLUoxetine (PROZAC) 20 MG capsule Take 20 mg by mouth daily.     losartan (COZAAR) 50 MG tablet Take 50 mg by mouth daily.      MAGNESIUM GLYCINATE PO Take by mouth.     methocarbamol (ROBAXIN) 500 MG tablet Take 500 mg by mouth every 8 (eight) hours as needed.     montelukast (SINGULAIR) 10 MG tablet TAKE 1 TABLET(10 MG) BY MOUTH DAILY 90 tablet 1   Multiple Vitamins-Minerals (CENTRUM SILVER PO) Take 1 tablet by mouth daily.     oxyCODONE (ROXICODONE) 5 MG immediate release tablet Take 1 tablet (5 mg total) by mouth every 4 (four) hours as needed for severe pain (pain score 7-10). 10 tablet 0   pantoprazole (PROTONIX) 40 MG tablet TAKE 30 TO 60 MINUTES BEFORE THE FIRST AND LAST MEAL OF THE DAY 180 tablet 0   Potassium 99 MG TABS Take by mouth.     rosuvastatin (CRESTOR) 10 MG tablet Take 10 mg by mouth every evening.   1   Theanine 50 MG TBDP Take by mouth.     traMADol (ULTRAM) 50 MG tablet Take 50 mg by mouth 3 (three) times daily as needed.     triamcinolone cream (KENALOG) 0.1 % Apply  1 Application topically 2 (two) times daily.     Turmeric (QC  TUMERIC COMPLEX PO) Take 1,400 mg by mouth daily.     valACYclovir (VALTREX) 1000 MG tablet Take 1,000 mg by mouth daily as needed.      No current facility-administered medications for this visit.    Allergies  Allergen Reactions   Dextromethorphan     Other Reaction(s): cognitive impairment     REVIEW OF SYSTEMS:  *** [X]  denotes positive finding, [ ]  denotes negative finding Cardiac  Comments:  Chest pain or chest pressure:    Shortness of breath upon exertion:    Short of breath when lying flat:    Irregular heart rhythm:        Vascular    Pain in calf, thigh, or hip brought on by ambulation:    Pain in feet at night that wakes you up from your sleep:     Blood clot in your veins:    Leg swelling:         Pulmonary    Oxygen at home:    Productive cough:     Wheezing:         Neurologic    Sudden weakness in arms or legs:     Sudden numbness in arms or legs:     Sudden onset of difficulty speaking or slurred speech:    Temporary loss of vision in one eye:     Problems with dizziness:         Gastrointestinal    Blood in stool:     Vomited blood:         Genitourinary    Burning when urinating:     Blood in urine:        Psychiatric    Major depression:         Hematologic    Bleeding problems:    Problems with blood clotting too easily:        Skin    Rashes or ulcers:        Constitutional    Fever or chills:      PHYSICAL EXAMINATION:  There were no vitals filed for this visit.  General:  WDWN in NAD; vital signs documented above Gait: Not observed HENT: WNL, normocephalic Pulmonary: normal non-labored breathing , without wheezing Cardiac: {Desc; regular/irreg:14544} HR Abdomen: soft, NT, no masses Skin: {With/Without:20273} rashes Vascular Exam/Pulses:  Right Left  Radial {Exam; arterial pulse strength 0-4:30167} {Exam; arterial pulse strength 0-4:30167}  Ulnar {Exam; arterial pulse strength 0-4:30167} {Exam; arterial pulse strength  0-4:30167}  Femoral {Exam; arterial pulse strength 0-4:30167} {Exam; arterial pulse strength 0-4:30167}  Popliteal {Exam; arterial pulse strength 0-4:30167} {Exam; arterial pulse strength 0-4:30167}  DP {Exam; arterial pulse strength 0-4:30167} {Exam; arterial pulse strength 0-4:30167}  PT {Exam; arterial pulse strength 0-4:30167} {Exam; arterial pulse strength 0-4:30167}   Extremities: {With/Without:20273} ischemic changes, {With/Without:20273} Gangrene , {With/Without:20273} cellulitis; {With/Without:20273} open wounds;  Musculoskeletal: no muscle wasting or atrophy  Neurologic: A&O X 3;  No focal weakness or paresthesias are detected Psychiatric:  The pt has {Desc; normal/abnormal:11317::"Normal"} affect.   Non-Invasive Vascular Imaging:   ***    ASSESSMENT/PLAN: Katlen Seyer is a 73 y.o. female presenting with ***   ***   Victorino Sparrow, MD [facility] [phone]

## 2023-07-27 ENCOUNTER — Encounter: Payer: Medicare Other | Admitting: Vascular Surgery

## 2023-07-28 ENCOUNTER — Ambulatory Visit (HOSPITAL_BASED_OUTPATIENT_CLINIC_OR_DEPARTMENT_OTHER)
Admission: RE | Admit: 2023-07-28 | Discharge: 2023-07-28 | Disposition: A | Discharge status: Home / Self Care | Payer: Medicare Other | Source: Ambulatory Visit | Attending: Emergency Medicine | Admitting: Emergency Medicine

## 2023-07-28 DIAGNOSIS — Z043 Encounter for examination and observation following other accident: Secondary | ICD-10-CM | POA: Diagnosis not present

## 2023-07-28 DIAGNOSIS — S3992XA Unspecified injury of lower back, initial encounter: Secondary | ICD-10-CM | POA: Diagnosis not present

## 2023-07-28 DIAGNOSIS — M51369 Other intervertebral disc degeneration, lumbar region without mention of lumbar back pain or lower extremity pain: Secondary | ICD-10-CM | POA: Insufficient documentation

## 2023-07-28 DIAGNOSIS — M48061 Spinal stenosis, lumbar region without neurogenic claudication: Secondary | ICD-10-CM | POA: Insufficient documentation

## 2023-07-28 DIAGNOSIS — M4804 Spinal stenosis, thoracic region: Secondary | ICD-10-CM | POA: Diagnosis not present

## 2023-07-28 DIAGNOSIS — M4186 Other forms of scoliosis, lumbar region: Secondary | ICD-10-CM | POA: Diagnosis not present

## 2023-07-28 DIAGNOSIS — M549 Dorsalgia, unspecified: Secondary | ICD-10-CM | POA: Diagnosis not present

## 2023-07-31 DIAGNOSIS — M25511 Pain in right shoulder: Secondary | ICD-10-CM | POA: Diagnosis not present

## 2023-07-31 DIAGNOSIS — R2681 Unsteadiness on feet: Secondary | ICD-10-CM | POA: Diagnosis not present

## 2023-07-31 DIAGNOSIS — L299 Pruritus, unspecified: Secondary | ICD-10-CM | POA: Diagnosis not present

## 2023-07-31 DIAGNOSIS — Z85828 Personal history of other malignant neoplasm of skin: Secondary | ICD-10-CM | POA: Diagnosis not present

## 2023-07-31 DIAGNOSIS — I10 Essential (primary) hypertension: Secondary | ICD-10-CM | POA: Diagnosis not present

## 2023-07-31 DIAGNOSIS — D225 Melanocytic nevi of trunk: Secondary | ICD-10-CM | POA: Diagnosis not present

## 2023-07-31 DIAGNOSIS — L821 Other seborrheic keratosis: Secondary | ICD-10-CM | POA: Diagnosis not present

## 2023-07-31 DIAGNOSIS — F325 Major depressive disorder, single episode, in full remission: Secondary | ICD-10-CM | POA: Diagnosis not present

## 2023-07-31 DIAGNOSIS — N1831 Chronic kidney disease, stage 3a: Secondary | ICD-10-CM | POA: Diagnosis not present

## 2023-07-31 DIAGNOSIS — J45991 Cough variant asthma: Secondary | ICD-10-CM | POA: Diagnosis not present

## 2023-07-31 DIAGNOSIS — L814 Other melanin hyperpigmentation: Secondary | ICD-10-CM | POA: Diagnosis not present

## 2023-07-31 DIAGNOSIS — R2689 Other abnormalities of gait and mobility: Secondary | ICD-10-CM | POA: Diagnosis not present

## 2023-07-31 DIAGNOSIS — E785 Hyperlipidemia, unspecified: Secondary | ICD-10-CM | POA: Diagnosis not present

## 2023-07-31 DIAGNOSIS — K219 Gastro-esophageal reflux disease without esophagitis: Secondary | ICD-10-CM | POA: Diagnosis not present

## 2023-08-01 DIAGNOSIS — G3184 Mild cognitive impairment, so stated: Secondary | ICD-10-CM | POA: Diagnosis not present

## 2023-08-07 NOTE — Progress Notes (Unsigned)
 Cardiology Office Note:  .   Date:  08/08/2023  ID:  Shelly [name], DOB [birth year], MRN [id number] PCP: [name], MD  [facility] Providers Cardiologist:  [name], MD {  History of Present Illness: .   Shelly [name] is a 73 y.o. female who has a PMH of coronary calcium score noted in 2015.  Asymptomatic and negative stress test at that time.  CAD, HTN, family history (brother had CABG in the past few years) C, and back surgery in 2017 here for follow-up appointment.  Saw vascular surgery and at that time was referred after recent TIA with right subclavian stenosis.  November 05, 2022 she had an event where she had severe confusion and difficulty driving and understanding her actions.  Sent to the ER and CT angio of the head and neck showed a right subclavian artery stenosis on MRI scan which showed no evidence of stroke with possible symptoms of TIA.  She was seen by neurologist as well.  Started on aspirin and statin.  States that she did have loss of vision in her left eye secondary to an intraocular problem.  She did have an MRI in October 2018 which showed no infarct.  She was seen by vascular it was not thought that her subclavian artery stenosis was the etiology of her TIA.  Rest of her great vessels were widely patent.  Was cleared for driving at that time.  Patient was checked for dementia and had some vascular changes in her brain.  January 2020 she was considering a keto diet but plant-based/Mediterranean diet was preferred due to her history.  Cardiac surgery and spinal cord stimulator in 2020.  For exercise, she was walking 20 to 25 miles a week but limited by leg pain.  Persisting cough related to GERD.  Prosthetic leg in 2021 having some falls unfortunately.  In 2022 negative CT of the head and x-rays of the hip after a fall.  2024 she felt her memory was a little worse.  Lives alone.  Eats as healthy as she can.  Exercise somewhat limited by prior amputation.   No dizziness.  No chest pain, palpitations, PND, SOB, syncope, orthopnea, dizziness, leg edema, or nitroglycerin use at that time (March 2024).  Today, she presents with a history of right foot amputation for an annual follow-up visit. She reports a faster than normal heart rate, which she attributes to a stressful event prior to the appointment. She has a history of what she describes as "low blood," which may contribute to her elevated heart rate.  The patient also reports a recent diagnosis of dementia, which has caused significant distress as she has witnessed the effects of the condition in family members. She expresses a desire to maintain a pleasant demeanor as the condition progresses.  The patient has been experiencing ongoing issues with leg pain and tightness in her remaining leg. She describes the leg as feeling "tight and intact," and has noticed a color difference between the upper and lower parts of the leg.  The patient also reports a recent history of falls, which have resulted in injuries including a laceration requiring stitches and shoulder pain. She attributes the falls to difficulty with balance and mobility due to her amputation and has begun using a cane for assistance.  Reports no shortness of breath nor dyspnea on exertion. Reports no chest pain, pressure, or tightness. No edema, orthopnea, PND. Reports no palpitations.   Discussed the use of AI scribe software  for clinical note transcription with the patient, who gave verbal consent to proceed.  ROS: Pertinent ROS in HPI  Studies Reviewed: Marland Kitchen        Moderate left carotid plaque seen on Dopplers 04/11/2023.  Recommended repeat in 1 year.       Physical Exam:   VS:  BP 120/80   Pulse (!) 109   Ht 5\' 5"  (1.651 m)   Wt 166 lb 3.2 oz (75.4 kg)   LMP  (LMP Unknown)   SpO2 91%   BMI 27.66 kg/m    Wt Readings from Last 3 Encounters:  08/08/23 166 lb 3.2 oz (75.4 kg)  05/03/23 177 lb 3.2 oz (80.4 kg)  02/07/23 179  lb (81.2 kg)    GEN: Well nourished, well developed in no acute distress NECK: No JVD; No carotid bruits CARDIAC: RRR, no murmurs, rubs, gallops RESPIRATORY:  Clear to auscultation without rales, wheezing or rhonchi  ABDOMEN: Soft, non-tender, non-distended EXTREMITIES:  No edema; No deformity   ASSESSMENT AND PLAN: .    Sinus Tachycardia Elevated heart rate likely due to stress. No history of heart disease. -Monitor heart rate and blood pressure at home. -Contact office if heart rate consistently remains in the 90s or if systolic blood pressure exceeds 135.  Peripheral Artery Disease (PAD) Right below-knee amputation. Current concern with left lower leg tightness and discoloration. -Keep appointment with Dr. Karin Lieu for further evaluation and management.  Dementia Recent diagnosis. Family history of dementia. -Continue care with psychologist.  Frequent Falls History of multiple falls, recent fall resulted in laceration and shoulder pain. -Continue with planned physical therapy. -Consider use of assistive devices (canes) to prevent further falls.  Shoulder Pain Recent onset following a fall. She has been using OTC gels/creams -Consider evaluation by orthopedic specialist if pain persists or worsens.  General Health Maintenance / Followup Plans -Establish care with Dr. Kary Kos in 6 months, then yearly follow-up. -Ensure all medication refills are up to date.  Coronary artery calcification -no recent chest pain or SOB -continue current medications      Dispo: She can follow-up with Dr. Kirke Corin in 6 months to establish care  Signed, Sharlene Dory, PA-C

## 2023-08-08 ENCOUNTER — Ambulatory Visit: Payer: Medicare Other | Attending: Physician Assistant | Admitting: Physician Assistant

## 2023-08-08 ENCOUNTER — Encounter: Payer: Self-pay | Admitting: Physician Assistant

## 2023-08-08 VITALS — BP 120/80 | HR 109 | Ht 65.0 in | Wt 166.2 lb

## 2023-08-08 DIAGNOSIS — M79662 Pain in left lower leg: Secondary | ICD-10-CM | POA: Diagnosis not present

## 2023-08-08 DIAGNOSIS — I10 Essential (primary) hypertension: Secondary | ICD-10-CM

## 2023-08-08 DIAGNOSIS — I779 Disorder of arteries and arterioles, unspecified: Secondary | ICD-10-CM | POA: Diagnosis not present

## 2023-08-08 DIAGNOSIS — I2510 Atherosclerotic heart disease of native coronary artery without angina pectoris: Secondary | ICD-10-CM

## 2023-08-08 DIAGNOSIS — E785 Hyperlipidemia, unspecified: Secondary | ICD-10-CM | POA: Diagnosis not present

## 2023-08-08 DIAGNOSIS — R600 Localized edema: Secondary | ICD-10-CM | POA: Diagnosis not present

## 2023-08-08 DIAGNOSIS — I771 Stricture of artery: Secondary | ICD-10-CM | POA: Diagnosis not present

## 2023-08-08 DIAGNOSIS — M7989 Other specified soft tissue disorders: Secondary | ICD-10-CM

## 2023-08-08 MED ORDER — LOSARTAN POTASSIUM 50 MG PO TABS: 50.0000 mg | ORAL_TABLET | Freq: Every day | ORAL | 3 refills | Status: DC

## 2023-08-08 MED ORDER — ROSUVASTATIN CALCIUM 10 MG PO TABS: 10.0000 mg | ORAL_TABLET | Freq: Every evening | ORAL | 3 refills | Status: DC

## 2023-08-08 NOTE — Patient Instructions (Signed)
 Medication Instructions:  Your physician recommends that you continue on your current medications as directed. Please refer to the Current Medication list given to you today.  *If you need a refill on your cardiac medications before your next appointment, please call your pharmacy*   Lab Work: None ordered  If you have labs (blood work) drawn today and your tests are completely normal, you will receive your results only by: MyChart Message (if you have MyChart) OR A paper copy in the mail If you have any lab test that is abnormal or we need to change your treatment, we will call you to review the results.   Testing/Procedures: None ordered   Follow-Up: At [facility], you and your health needs are our priority.  As part of our continuing mission to provide you with exceptional heart care, we have created designated Provider Care Teams.  These Care Teams include your primary Cardiologist (physician) and Advanced Practice Providers (APPs -  Physician Assistants and Nurse Practitioners) who all work together to provide you with the care you need, when you need it.  We recommend signing up for the patient portal called "MyChart".  Sign up information is provided on this After Visit Summary.  MyChart is used to connect with patients for Virtual Visits (Telemedicine).  Patients are able to view lab/test results, encounter notes, upcoming appointments, etc.  Non-urgent messages can be sent to your provider as well.   To learn more about what you can do with MyChart, go to [email or web address].    Your next appointment:   6 month(s)  Provider:   [name], MD     Other Instructions Keep a record and Heart Rate.  Let me know if your blood pressure is over 135 on the to consecutively and your Heart Rate over 90's/    1st Floor: - Lobby - Registration  - Pharmacy  - Lab - Cafe  2nd Floor: - PV Lab - Diagnostic Testing (echo, CT, nuclear med)  3rd Floor: -  Vacant  4th Floor: - TCTS (cardiothoracic surgery) - AFib Clinic - Structural Heart Clinic - Vascular Surgery  - Vascular Ultrasound  5th Floor: - [facility] Cardiology (general and EP) - Clinical Pharmacy for coumadin, hypertension, lipid, weight-loss medications, and med management appointments    Valet parking services will be available as well.

## 2023-08-10 ENCOUNTER — Telehealth: Payer: Self-pay | Admitting: *Deleted

## 2023-08-10 NOTE — Telephone Encounter (Signed)
 Left a message for the patient to call back to make an appointment with Dr. Kirke Corin on 3/11 in the [place] office.

## 2023-08-14 NOTE — Telephone Encounter (Signed)
 Appointment made for 3/11 with Dr. Kirke Corin. Appointment canceled with [facility] per her request. She thought the appointment with [facility] was for an orthopedist.

## 2023-08-15 ENCOUNTER — Ambulatory Visit: Attending: Cardiovascular Disease | Admitting: Cardiovascular Disease

## 2023-08-15 ENCOUNTER — Encounter: Payer: Self-pay | Admitting: Cardiovascular Disease

## 2023-08-15 VITALS — BP 110/70 | HR 82 | Ht 65.0 in | Wt 166.8 lb

## 2023-08-15 DIAGNOSIS — E785 Hyperlipidemia, unspecified: Secondary | ICD-10-CM | POA: Diagnosis not present

## 2023-08-15 DIAGNOSIS — I2510 Atherosclerotic heart disease of native coronary artery without angina pectoris: Secondary | ICD-10-CM | POA: Diagnosis not present

## 2023-08-15 DIAGNOSIS — M79605 Pain in left leg: Secondary | ICD-10-CM | POA: Diagnosis not present

## 2023-08-15 DIAGNOSIS — I10 Essential (primary) hypertension: Secondary | ICD-10-CM | POA: Insufficient documentation

## 2023-08-15 NOTE — Progress Notes (Signed)
 Cardiology Office Note   Date:  08/15/2023   ID:  Shelly, [name] [birth year], MRN [id number]  PCP:  [name], MD  Cardiologist:   [name], MD   No chief complaint on file.     History of Present Illness: Shelly [name] is a 73 y.o. female who presents to establish cardiovascular care.  She has known history of elevated coronary calcium score, essential hypertension, hyperlipidemia and previous back surgery.  She is known to have right subclavian artery stenosis being managed medically.  She also has been evaluated for memory decline and possible early dementia. She is status post right below the knee amputation due to avascular necrosis in 2014.   She denies chest pain or shortness of breath.  She is concerned about left leg discomfort.  The below the knee area is sensitive to touch but there is no pain with walking.  She has no lower extremity ulceration.  She had noninvasive vascular testing in December of last year that showed normal ABI on the left and duplex showed no significant obstructive disease.   Past Medical History:  Diagnosis Date   Anxiety    Arthritis    Asthma    Avascular necrosis of talus (HCC)    Cancer (HCC)    vulva pre cancer had surgery   Depression    Gait abnormality 03/20/2017   GERD (gastroesophageal reflux disease)    Hearing loss    "very minor"   History of pneumonia    Hyperlipidemia    Hypertension    Leg edema    left   Memory difficulty 03/20/2017   PONV (postoperative nausea and vomiting)    Stress incontinence     Past Surgical History:  Procedure Laterality Date   ANKLE FUSION  2011   right multiple    ANKLE FUSION     rear ankle fusion   ANKLE SURGERY  2010   right cordicompression   AORTIC ARCH ANGIOGRAPHY N/A 12/11/2017   Procedure: AORTIC ARCH ANGIOGRAPHY;  Surgeon: [name], MD;  Location: [facility];  Service: Cardiovascular;  Laterality: N/A;   APPENDECTOMY  05/10/2016   BELOW  KNEE LEG AMPUTATION     CATARACT EXTRACTION W/ INTRAOCULAR LENS IMPLANT Left    LAPAROSCOPIC APPENDECTOMY N/A 05/10/2016   Procedure: APPENDECTOMY LAPAROSCOPIC;  Surgeon: [name], MD;  Location: [facility];  Service: General;  Laterality: N/A;   LUMBAR LAMINECTOMY/DECOMPRESSION MICRODISCECTOMY N/A 09/09/2015   Procedure:  L4-S1 Decompression/ Discetomy;  Surgeon: [name], MD;  Location: [facility];  Service: Orthopedics;  Laterality: N/A;   SKIN GRAFT     SPINAL CORD STIMULATOR INSERTION N/A 01/10/2019   Procedure: LUMBAR SPINAL CORD STIMULATOR INSERTION;  Surgeon: [name], MD;  Location: [facility];  Service: Orthopedics;  Laterality: N/A;  2.5 hrs   TONSILLECTOMY     TUBAL LIGATION  1983   VULVECTOMY N/A 01/28/2015   Procedure: WIDE EXCISION VULVECTOMY;  Surgeon: [name], MD;  Location: [facility];  Service: Gynecology;  Laterality: N/A;     Current Outpatient Medications  Medication Sig Dispense Refill   acidophilus (RISAQUAD) CAPS capsule Take 1 capsule by mouth daily.     albuterol (PROAIR HFA) 108 (90 Base) MCG/ACT inhaler 2 puffs every 4 hours as needed only  if your can't catch your breath 18 g 11   B Complex-Folic Acid (B COMPLEX PLUS PO) 1 tablet     benzonatate (TESSALON) 200 MG capsule TAKE 1 CAPSULE(200 MG)  BY MOUTH THREE TIMES DAILY AS NEEDED FOR COUGH 30 capsule 1   Biotin 5000 MCG TABS Take 10,000 mcg by mouth every evening.     buPROPion (WELLBUTRIN XL) 300 MG 24 hr tablet Take 300 mg by mouth daily.     Calcium Carbonate (CALCIUM 600 PO) Take by mouth daily.     chlorpheniramine (CHLOR-TRIMETON) 4 MG tablet Take 8 mg by mouth 2 (two) times a day.      Cholecalciferol (VITAMIN D-3) 125 MCG (5000 UT) TABS Take by mouth daily.     Cyanocobalamin (B-12 PO) Take 5,000 mcg by mouth daily.      denosumab (PROLIA) 60 MG/ML SOLN injection Inject 60 mg into the skin every 6 (six) months.      FLUoxetine (PROZAC) 20 MG capsule Take 20 mg by mouth daily.     losartan (COZAAR)  50 MG tablet Take 1 tablet (50 mg total) by mouth daily. 90 tablet 3   MAGNESIUM GLYCINATE PO Take by mouth.     methocarbamol (ROBAXIN) 500 MG tablet Take 500 mg by mouth every 8 (eight) hours as needed.     montelukast (SINGULAIR) 10 MG tablet TAKE 1 TABLET(10 MG) BY MOUTH DAILY 90 tablet 1   Multiple Vitamins-Minerals (CENTRUM SILVER PO) Take 1 tablet by mouth daily.     pantoprazole (PROTONIX) 40 MG tablet TAKE 30 TO 60 MINUTES BEFORE THE FIRST AND LAST MEAL OF THE DAY 180 tablet 0   Potassium 99 MG TABS Take by mouth.     rosuvastatin (CRESTOR) 10 MG tablet Take 1 tablet (10 mg total) by mouth every evening. 90 tablet 3   Theanine 50 MG TBDP Take by mouth.     traMADol (ULTRAM) 50 MG tablet Take 50 mg by mouth 3 (three) times daily as needed.     triamcinolone cream (KENALOG) 0.1 % Apply 1 Application topically 2 (two) times daily.     Turmeric (QC TUMERIC COMPLEX PO) Take 1,400 mg by mouth daily.     valACYclovir (VALTREX) 1000 MG tablet Take 1,000 mg by mouth daily as needed.      oxyCODONE (ROXICODONE) 5 MG immediate release tablet Take 1 tablet (5 mg total) by mouth every 4 (four) hours as needed for severe pain (pain score 7-10). 10 tablet 0   No current facility-administered medications for this visit.    Allergies:   Dextromethorphan    Social History:  The patient  reports that she quit smoking about 16 years ago. Her smoking use included cigarettes. She started smoking about 56 years ago. She has a 40 pack-year smoking history. She has never used smokeless tobacco. She reports current alcohol use. She reports that she does not use drugs.   Family History:  The patient's family history includes Allergies in her mother; Breast cancer in her sister; Dementia (age of onset: 68) in her mother; Fibromyalgia in her mother; Heart attack (age of onset: 69) in her father; Heart disease in her brother; Heart murmur (age of onset: 41) in her sister; Multiple myeloma in her father; Other in  her brother; Other (age of onset: 79) in her sister.    ROS:  Please see the history of present illness.   Otherwise, review of systems are positive for none.   All other systems are reviewed and negative.    PHYSICAL EXAM: VS:  BP 110/70   Pulse 82   Ht 5\' 5"  (1.651 m)   Wt 166 lb 12.8 oz (75.7 kg)  LMP  (LMP Unknown)   SpO2 94%   BMI 27.76 kg/m  , BMI Body mass index is 27.76 kg/m. GEN: Well nourished, well developed, in no acute distress  HEENT: normal  Neck: no JVD, carotid bruits, or masses Cardiac: RRR; no murmurs, rubs, or gallops,no edema  Respiratory:  clear to auscultation bilaterally, normal work of breathing GI: soft, nontender, nondistended, + BS MS: no deformity or atrophy  Skin: warm and dry, no rash Neuro:  Strength and sensation are intact Psych: euthymic mood, full affect Vascular: Femoral pulse: +2 bilaterally.  Right below the knee amputation.  Left posterior tibial artery: +2.  Dorsalis pedis is not palpable   EKG:  EKG is not ordered today.    Recent Labs: 04/01/2023: ALT 19; BUN 15; Creatinine, Ser 0.78; Hemoglobin 11.4; Platelets 286; Potassium 4.8; Sodium 136    Lipid Panel No results found for: "CHOL", "TRIG", "HDL", "CHOLHDL", "VLDL", "LDLCALC", "LDLDIRECT"    Wt Readings from Last 3 Encounters:  08/15/23 166 lb 12.8 oz (75.7 kg)  08/08/23 166 lb 3.2 oz (75.4 kg)  05/03/23 177 lb 3.2 oz (80.4 kg)            No data to display            ASSESSMENT AND PLAN:  1.  Left leg pain: Her symptoms are not consistent with claudication.  In addition, her vascular exam is unremarkable on the left with normal pulses except diminished dorsalis pedis but posterior tibialis +2.  Noninvasive vascular testing in December showed normal ABI on the left and duplex showed no significant obstructive disease.  No further vascular workup is recommended.  Right below the knee amputation was not due to a vascular etiology but it was due to avascular  necrosis.  2.  Coronary artery calcifications: Noted on previous imaging. Currently with no cardiac symptoms.  Continue management of risk factors.  3.  Essential hypertension: Blood pressure is controlled.  4.  Hyperlipidemia: Currently on rosuvastatin.  Recommended target LDL of less than 70.  5.  Right subclavian artery stenosis: Angiography was done in 2019 by Dr. Darrick Penna which showed moderate 40% stenosis.  Continue medical therapy.    Disposition:   FU with me in 1 year  Signed,  [name], MD  08/15/2023 10:12 AM    [facility]

## 2023-08-15 NOTE — Patient Instructions (Signed)
 Medication Instructions:  No changes *If you need a refill on your cardiac medications before your next appointment, please call your pharmacy*   Lab Work: None ordered If you have labs (blood work) drawn today and your tests are completely normal, you will receive your results only by: MyChart Message (if you have MyChart) OR A paper copy in the mail If you have any lab test that is abnormal or we need to change your treatment, we will call you to review the results.   Testing/Procedures: None ordered   Follow-Up: At [facility], you and your health needs are our priority.  As part of our continuing mission to provide you with exceptional heart care, we have created designated Provider Care Teams.  These Care Teams include your primary Cardiologist (physician) and Advanced Practice Providers (APPs -  Physician Assistants and Nurse Practitioners) who all work together to provide you with the care you need, when you need it.  We recommend signing up for the patient portal called "MyChart".  Sign up information is provided on this After Visit Summary.  MyChart is used to connect with patients for Virtual Visits (Telemedicine).  Patients are able to view lab/test results, encounter notes, upcoming appointments, etc.  Non-urgent messages can be sent to your provider as well.   To learn more about what you can do with MyChart, go to [email or web address].    Your next appointment:   12 month(s)  Provider:   [name], MD

## 2023-08-21 ENCOUNTER — Ambulatory Visit (INDEPENDENT_AMBULATORY_CARE_PROVIDER_SITE_OTHER): Admitting: Physical Therapy

## 2023-08-21 ENCOUNTER — Encounter: Payer: Self-pay | Admitting: Physical Therapy

## 2023-08-21 DIAGNOSIS — R296 Repeated falls: Secondary | ICD-10-CM | POA: Diagnosis not present

## 2023-08-21 DIAGNOSIS — M6281 Muscle weakness (generalized): Secondary | ICD-10-CM | POA: Diagnosis not present

## 2023-08-21 DIAGNOSIS — R2689 Other abnormalities of gait and mobility: Secondary | ICD-10-CM

## 2023-08-21 DIAGNOSIS — R2681 Unsteadiness on feet: Secondary | ICD-10-CM | POA: Diagnosis not present

## 2023-08-21 DIAGNOSIS — R293 Abnormal posture: Secondary | ICD-10-CM

## 2023-08-21 DIAGNOSIS — M25651 Stiffness of right hip, not elsewhere classified: Secondary | ICD-10-CM

## 2023-08-21 NOTE — Therapy (Addendum)
 PHYSICAL THERAPY DISCHARGE SUMMARY  Visits from Start of Care: 1  Current functional level related to goals / functional outcomes: none   Remaining deficits: No change as not seen since evaluation.   Education / Equipment: Initial HEP at evaluation.    Patient agrees to discharge. Patient goals were not met. Patient is being discharged due to a change in medical status.  Patient has been hospitalized twice since evaluation.  PT will discharge until medically stable.  Please refer back to PT when she is medically stable.   Lorie Rook, PT, DPT 09/18/2023, 7:58 AM   OUTPATIENT PHYSICAL THERAPY PROSTHETIC EVALUATION   Patient Name: Shelly [name] MRN: [id number] DOB:[birth year], 73 y.o., female Today's Date: 08/21/2023  END OF SESSION:  PT End of Session - 08/21/23 1144     Visit Number 1    Number of Visits 20    Date for PT Re-Evaluation 11/02/23    Authorization Type Medicare A&B and [employer] supplement    Progress Note Due on Visit 10    PT Start Time 1100    PT Stop Time 1145    PT Time Calculation (min) 45 min    Equipment Utilized During Treatment Gait belt    Activity Tolerance Patient tolerated treatment well    Behavior During Therapy WFL for tasks assessed/performed             Past Medical History:  Diagnosis Date   Anxiety    Arthritis    Asthma    Avascular necrosis of talus (HCC)    Cancer (HCC)    vulva pre cancer had surgery   Depression    Gait abnormality 03/20/2017   GERD (gastroesophageal reflux disease)    Hearing loss    "very minor"   History of pneumonia    Hyperlipidemia    Hypertension    Leg edema    left   Memory difficulty 03/20/2017   PONV (postoperative nausea and vomiting)    Stress incontinence    Past Surgical History:  Procedure Laterality Date   ANKLE FUSION  2011   right multiple    ANKLE FUSION     rear ankle fusion   ANKLE SURGERY  2010   right cordicompression   AORTIC ARCH ANGIOGRAPHY N/A 12/11/2017    Procedure: AORTIC ARCH ANGIOGRAPHY;  Surgeon: Richrd Char, MD;  Location: [facility];  Service: Cardiovascular;  Laterality: N/A;   APPENDECTOMY  05/10/2016   BELOW KNEE LEG AMPUTATION     CATARACT EXTRACTION W/ INTRAOCULAR LENS IMPLANT Left    LAPAROSCOPIC APPENDECTOMY N/A 05/10/2016   Procedure: APPENDECTOMY LAPAROSCOPIC;  Surgeon: Oza Blumenthal, MD;  Location: [facility];  Service: General;  Laterality: N/A;   LUMBAR LAMINECTOMY/DECOMPRESSION MICRODISCECTOMY N/A 09/09/2015   Procedure:  L4-S1 Decompression/ Discetomy;  Surgeon: Mort Ards, MD;  Location: [facility];  Service: Orthopedics;  Laterality: N/A;   SKIN GRAFT     SPINAL CORD STIMULATOR INSERTION N/A 01/10/2019   Procedure: LUMBAR SPINAL CORD STIMULATOR INSERTION;  Surgeon: Mort Ards, MD;  Location: [facility];  Service: Orthopedics;  Laterality: N/A;  2.5 hrs   TONSILLECTOMY     TUBAL LIGATION  1983   VULVECTOMY N/A 01/28/2015   Procedure: WIDE EXCISION VULVECTOMY;  Surgeon: Johnn Najjar, MD;  Location: [facility];  Service: Gynecology;  Laterality: N/A;   Patient Active Problem List   Diagnosis Date Noted   Anxiety 05/19/2022   Acute bronchitis 04/11/2022   GERD (gastroesophageal reflux disease) 02/22/2022  Upper airway cough syndrome 03/26/2019   Chronic pain 01/10/2019   MCI (mild cognitive impairment) 03/20/2017   Gait abnormality 03/20/2017   History of right below knee amputation (HCC) 07/11/2016   Adenomatous colon polyp 05/10/2016   Coronary artery calcification 01/07/2016   Essential hypertension 01/07/2016   Back pain 09/09/2015   Abnormal CXR 05/16/2014   Cough variant asthma ? assoc with [facility] 05/15/2014   Acute post-operative pain 02/18/2012   Adiposity 02/17/2012   Does use eyeglasses 02/13/2012   H/O arthrodesis 10/21/2011   Fibromyalgia 04/11/2011   HLD (hyperlipidemia) 04/11/2011   Avascular necrosis of talus (HCC) 04/11/2011   Osteomyelitis of ankle (HCC) 04/11/2011   Leg edema 09/08/2010     PCP: Faustina Hood, MD  REFERRING PROVIDER: Faustina Hood, MD  ONSET DATE: 06/25/2023 fall  REFERRING DIAG: R26.81 (ICD-10-CM) - Unsteadiness on feet   THERAPY DIAG:  Unsteadiness on feet  Repeated falls  Muscle weakness (generalized)  Other abnormalities of gait and mobility  Stiffness of right hip, not elsewhere classified  Abnormal posture  Rationale for Evaluation and Treatment: Rehabilitation  SUBJECTIVE:   SUBJECTIVE STATEMENT: 06/25/2023 she fell when stepping back and hit head with concussion. Denies light-headness & dizziness.  She has headaches noticing more at night wrapping from above ears to back of head.    She has injury to right dominant shoulder with difficulty lifting her arm.  She has appt next week to have shoulder assessed.    PERTINENT HISTORY: Rt TTA 02/17/2012, L4-S1 discectomy 2017, lumbar spinal stimulator 01/10/2019, TIA 2018 (CAT scan showed several TIAs), HTN, arthritis, anxiety, vulva pre cancer with surgery,   PAIN:  Are you having pain? Yes: NPRS scale: 3-4/10 today and 0/10 - 5-6/10 range Pain location: head ache above ears to posterior Pain description: ache Aggravating factors: feels more at night Relieving factors: massage, Aleve  PRECAUTIONS: Other: spinal stimulator  WEIGHT BEARING RESTRICTIONS: No  FALLS: Has patient fallen in last 6 months? Yes. Number of falls 3-4  LIVING ENVIRONMENT: Lives with: lives alone Lives in: Townhouse Home Access: Stairs to enter Home layout: Two level Stairs: Yes: Internal: 14-15 steps; on right going up has stair lift that covers rail and External: 1+1 steps; none Has following equipment at home: Single point cane, Walker - 2 wheeled, Environmental consultant - 4 wheeled, Wheelchair (manual), shower chair, and Grab bars  OCCUPATION: retired  PLOF: Independent, Independent with household mobility without device, and Independent with community mobility without device with prosthesis.   PATIENT GOALS:   stop  tripping & falling.   OBJECTIVE:  Patient-Specific Activity Scoring Scheme  "0" represents "unable to perform." "10" represents "able to perform at prior level. 0 1 2 3 4 5 6 7 8 9  10 (Date and Score)   Activity Eval     1. Walk in house & carry household items 7    2. Walk outdoors on paved surfaces 8    3. Maintain balance 5   4. getting out of chairs 5   5.    Score 6.25    Total score = sum of the activity scores/number of activities Minimum detectable change (90%CI) for average score = 2 points Minimum detectable change (90%CI) for single activity score = 3 points  POSTURE: rounded shoulders, forward head, and flexed trunk   LOWER EXTREMITY ROM:  ROM P:passive  A:active Right eval Left eval  Hip flexion    Hip extension Standing -9* Standing -8*  Hip abduction    Hip adduction  Hip internal rotation    Hip external rotation    Knee flexion    Knee extension    Ankle dorsiflexion    Ankle plantarflexion    Ankle inversion    Ankle eversion     (Blank rows = not tested)  LOWER EXTREMITY MMT:  MMT Right eval Left eval  Hip flexion    Hip extension Standing with UE support 5/5 Standing with UE support 5/5  Hip abduction Standing with UE support 4/5 Standing with UE support 4/5  Hip adduction    Hip internal rotation    Hip external rotation    Knee flexion    Knee extension 5/5 5/5  Ankle dorsiflexion    Ankle plantarflexion    Ankle inversion    Ankle eversion    At Evaluation all strength testing is grossly seated and functionally standing / gait. (Blank rows = not tested)  TRANSFERS: Sit to stand: SBA able to arise from 18" chair without UE assist with rocking and excessive trunk lean.   Stand to sit: SBA able to sit 18" chair without UE assist but uncontrolled last 25%  FUNCTIONAL TESTs:  5 times sit to stand: 18" chair with UEs on knees 44.00 sec Timed Up & Go no device  15.89 sec and cognitive (recipe) 19.55 sec with repeat & stops  naming during turns Berg Balance Scale: 40/56 Functional gait assessment: 8/30  [facility] PT Assessment - 08/21/23 1125       Standardized Balance Assessment   Standardized Balance Assessment Berg Balance Test      Berg Balance Test   Sit to Stand Able to stand  independently using hands    Standing Unsupported Able to stand safely 2 minutes    Sitting with Back Unsupported but Feet Supported on Floor or Stool Able to sit safely and securely 2 minutes    Stand to Sit Sits safely with minimal use of hands    Transfers Able to transfer safely, minor use of hands    Standing Unsupported with Eyes Closed Able to stand 10 seconds with supervision    Standing Unsupported with Feet Together Able to place feet together independently and stand 1 minute safely    From Standing, Reach Forward with Outstretched Arm Can reach confidently >25 cm (10")    From Standing Position, Pick up Object from Floor Able to pick up shoe safely and easily    From Standing Position, Turn to Look Behind Over each Shoulder Turn sideways only but maintains balance    Turn 360 Degrees Needs close supervision or verbal cueing    Standing Unsupported, Alternately Place Feet on Step/Stool Needs assistance to keep from falling or unable to try    Standing Unsupported, One Foot in Front Able to take small step independently and hold 30 seconds    Standing on One Leg Tries to lift leg/unable to hold 3 seconds but remains standing independently    Total Score 40    Berg comment: Group Information    BERG  < 36 high risk for falls (close to 100%) 46-51 moderate (>50%)   37-45 significant (>80%) 52-55 lower (> 25%)      Functional Gait  Assessment   Gait assessed  Yes    Gait Level Surface Walks 20 ft, slow speed, abnormal gait pattern, evidence for imbalance or deviates 10-15 in outside of the 12 in walkway width. Requires more than 7 sec to ambulate 20 ft.    Change in Gait Speed Able  to change speed, demonstrates mild gait  deviations, deviates 6-10 in outside of the 12 in walkway width, or no gait deviations, unable to achieve a major change in velocity, or uses a change in velocity, or uses an assistive device.    Gait with Horizontal Head Turns Performs head turns with moderate changes in gait velocity, slows down, deviates 10-15 in outside 12 in walkway width but recovers, can continue to walk.    Gait with Vertical Head Turns Performs task with moderate change in gait velocity, slows down, deviates 10-15 in outside 12 in walkway width but recovers, can continue to walk.    Gait and Pivot Turn Turns slowly, requires verbal cueing, or requires several small steps to catch balance following turn and stop    Step Over Obstacle Is able to step over one shoe box (4.5 in total height) but must slow down and adjust steps to clear box safely. May require verbal cueing.    Gait with Narrow Base of Support Ambulates less than 4 steps heel to toe or cannot perform without assistance.    Gait with Eyes Closed Cannot walk 20 ft without assistance, severe gait deviations or imbalance, deviates greater than 15 in outside 12 in walkway width or will not attempt task.    Ambulating Backwards Cannot walk 20 ft without assistance, severe gait deviations or imbalance, deviates greater than 15 in outside 12 in walkway width or will not attempt task.    Steps Two feet to a stair, must use rail.    Total Score 8    FGA comment: Max score 30  Interpretation: 25-28 = low risk fall   19-24 = medium risk fall  < 19 = high risk fall            GAIT: Gait pattern: step through pattern, decreased arm swing- Left, decreased step length- Right, knee flexed in stance- Right, knee flexed in stance- Left, trendelenburg, lateral hip instability, trunk flexed, and poor foot clearance- Left Distance walked: 300' Assistive device utilized:  TTA prosthesis Level of assistance: SBA  needs cues for deviations Gait velocity: 2.09 ft/sec comfortable  self-selected and 3.26 ft/sec fast pace but unsafe with shuffling pattern.  Comments: Functional Gait Assessment 8/30 with balance deficits noted when she is not focused on her gait.    CURRENT PROSTHETIC WEAR ASSESSMENT: 08/21/2023  Patient is independent with: skin check, residual limb care, care of non-amputated limb, prosthetic cleaning, ply sock cleaning, correct ply sock adjustment, proper wear schedule/adjustment, and proper weight-bearing schedule/adjustment Donning prosthesis: Complete Independence Doffing prosthesis: Complete Independence Prosthetic wear tolerance: >90% of awake hours daily Prosthetic weight bearing tolerance: >15  minutes with no limb pain Residual limb condition: pt reports no issues.  Prosthetic description: suction sleeve suspension, silicon gel interface, hydraulic response ankle.  K code/activity level with prosthetic use: Level 3    TODAY'S TREATMENT:  DATE:  08/21/2023 PT reviewed results of evaluation and plan of care.   HOME EXERCISE PROGRAM:  ASSESSMENT:  CLINICAL IMPRESSION: Patient is a 73 y.o. female who was seen today for physical therapy evaluation and treatment for balance deficits with repeated falls.  She has multiple medical issues complicating her function including Transtibial Amputation and back surgeries.  She reports weakness in dominant right shoulder following most recent fall and is scheduled to see orthopedist for assessment.  Patient has strength and range deficits effecting her balance & functional mobility.  Functional Outcome Testing all indicate high fall risk especially when she is not focused on her gait.  Patient would benefit from skilled PT to improve function and safety.   OBJECTIVE IMPAIRMENTS: Abnormal gait, decreased balance, decreased cognition, decreased endurance, decreased knowledge of condition,  decreased mobility, decreased ROM, decreased strength, postural dysfunction, and pain.   ACTIVITY LIMITATIONS: carrying, lifting, standing, stairs, transfers, and locomotion level  PARTICIPATION LIMITATIONS: community activity, church, and household ADLs  PERSONAL FACTORS: Age, Fitness, Past/current experiences, Time since onset of injury/illness/exacerbation, and 3+ comorbidities: see PMH  are also affecting patient's functional outcome.   REHAB POTENTIAL: Good  CLINICAL DECISION MAKING: Evolving/moderate complexity  EVALUATION COMPLEXITY: Moderate   GOALS: Goals reviewed with patient? Yes  SHORT TERM GOALS: Target date: 10/04/2023  Patient verbalizes and demonstrates understanding of initial HEP.  Baseline: SEE OBJECTIVE DATA Goal status: INITIAL 2.  Patient reports headache pain improved 25% from evaluation.  Baseline: SEE OBJECTIVE DATA Goal status: INITIAL  3.  Timed Up & Go <13.5 sec Baseline: SEE OBJECTIVE DATA Goal status: INITIAL   LONG TERM GOALS: Target date: 11/02/2023  Patient demonstrates & verbalized understanding of ongoing HEP Baseline: SEE OBJECTIVE DATA Goal status: INITIAL  Patient reports headache pain <2/10 for one week.  Baseline: SEE OBJECTIVE DATA Goal status: INITIAL  Functional Gait Assessment >/= 19/30 to indicate lower fall risk Baseline: SEE OBJECTIVE DATA Goal status: INITIAL  Cognitive Timed Up & Go <15.5sec to indicate lower fall risk when distracted Baseline: SEE OBJECTIVE DATA Goal status: INITIAL  Berg Balance >48/56 (8 point improvement) to indicate lower fall risk.  Baseline: SEE OBJECTIVE DATA Goal status: INITIAL  5 times Sit to Stand <30 sec indicating improved functional LE strength.  Baseline: SEE OBJECTIVE DATA Goal status: INITIAL   PLAN:  PT FREQUENCY: 2x/week  PT DURATION: 10 weeks  PLANNED INTERVENTIONS: [id number]- PT Re-evaluation, 97110-Therapeutic exercises, 97530- Therapeutic activity, 97112- Neuromuscular  re-education, 97535- Self Care, [id number]- Manual therapy, [phone]- Gait training, [phone]- Prosthetic training, Patient/Family education, Balance training, Stair training, Taping, Dry Needling, Joint mobilization, Vestibular training, and Physical Performance Testing.   PLAN FOR NEXT SESSION: set up HEP for hip flexor & hamstring stretch, hip & Knee strength and balance.     Shelly [name], PT, DPT 08/21/2023, 4:56 PM

## 2023-08-23 NOTE — Progress Notes (Unsigned)
 Patient: Shelly [name] Date of Birth: 12-16-1950  Reason for Visit: Follow up for memory History from: Patient, daughter  Primary Neurologist: Dr. Teresa Coombs  ASSESSMENT AND PLAN 73 y.o. year old female   1.  Mild cognitive impairment 2.  Anxiety, mood disorder  -Neuropsychological testing Feb 2025 with Dr. Rueben Bash was consistent with mild AD vs in 2023 felt memory issues were more multifactorial due to fractionated sleep patterns, chronic pain, psychological factors, anxiety -Today, MoCA 22/30 (21/30 Sept 2024) -ATN profile, Alzheimer's risk profile -May consider MRI of the brain, amyloid PET scan.  Of note, patient has a spinal stimulator, battery was recently changed that she is told is MRI compatible.  CT head in January 2025 showed mild chronic microvascular disease. -Consider discussing sleep consultation with primary care -Start Aricept working up to 10 mg at bedtime, after well-adjusted, add Namenda -Recommend brain stimulating exercises, consistent physical activity; good manage meant of vascular risk factors;  -Follow-up 6 months or sooner if needed  HISTORY OF PRESENT ILLNESS: Today 08/24/23 Received neuropsychological evaluation from 07/20/2023 from Dr. Eileen Stanford Renfroe.  Compared to previous evaluation she has shown significant improvement in mood, anxiety, sleep quality.  Her objective memory impairment has worsened.  Clinically she now meets criteria for dementia due to possible Alzheimer's disease, mild.   She had a mechanical fall in January 2025, needed stitches for laceration above right eye .  CT head showed mild chronic ischemic change.continues with right shoulder pain, seeing [facility]. 2 days before fall had battery replaced for spinal stimulator. 3 falls in 2 week period, 1 time fell back on the toilet and it broke. Daughter sent UTI test, was positive, took it 4 hours to complete. Got antibiotics. Dizziness resolved.   Here with her daughter who is a PA in  [place]. MOCA 22/30 today. Lives alone. All her kids live out of town. They are looking at friends home tomorrow independent living. 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 Managing her medications, affairs. Makes list of medications, does her box this way. Drives, but not at night, no accidents, has GPS is needed. They did gets lost on the way here. Sleeping well, lays in bed at 8 PM to red, gets up to use the bathroom, sleeps well other than right arm pain. Her mother had AD, never officially diagnosed. Can't afford hearing aids. Sleep study in the past was inconclusive. PCP was going to order. Has had anxiety, obviously worries about memory. Tries to do everything right, gets frustrated when she doesn't. Lost about 20 lbs in the last 15 months, was taking OTC weight loss pills. On protein regimen.  [name] been told spinal cord stimulator with new battery, is now MRI compatible.    Neuropsychological evaluation from Dr. Rueben Bash 01/10/22, consistent with unspecified mild cognitive impairment that is likely multifactorial.  Neurodegenerative process cannot fully be ruled out, but there is likely contribution from recent fractionated sleep patterns, her chronic pain, generalized anxiety, mood issues.  Could also be a vascular component given evidence of executive dysfunction on testing and presence of vascular risk factors and her medical history. Recommended repeat neuropsychological testing in 12 to 18 months.   Recommendations -Hearing evaluation with audiologist -Better sleep habits -Sleep apnea, OSA? -Recommended psychotherapy -Staying active, brain healthy diet  Update 02/07/23 SS: PCP sent back for worsening memory. Patient laughs "she is losing her mind". MOCA 21/30. Having gaps in her memory, can't remember ingredients for well know recipe, what she  was going into room for. Taking few supplements for memory (not sure what), forgot for 2 weeks, thought she was going into dementia.  Her mother had dementia, she is afraid of being like her. She is living alone, driving, doing fine with this, she gave her dog away had to walk 3 times a day, was too hot. 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 Hard time remembering peoples names. She is better now than she was 1 month ago, was very anxious at the time, due to worry for dementia. Has phobias, worries about getting "fat". Sleeping better, in bed around 7:30 PM, reading for several hours. 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 Summarizes her summer as "overwhelming" .  Update 05/19/22 SS: Doing well, MMSE 24/30 today. Did 6 weeks of counseling, was feeling overwhelmed, now feeling much better. Doesn't think she snores, no dry mouth, or apnea, no morning headache. Sleep is better, taking Nervive, helps with achy pains. Goes to bed 9 PM, 4 AM to go to work. She is a Social worker for 75 year old. After having neuropsychological evaluation, she felt reassured, feels memory issues are better. Working on anxiety. Had audiology evaluation, told needed hearing aids, too expensive. Has lost 12 lbs in the last few weeks, has changed her diet. Seeing pulmonary for lung issues.  Lives alone, drives a car.  Received neuropsychological evaluation from Dr. Rueben Bash 01/10/22, consistent with unspecified mild cognitive impairment that is likely multifactorial.  Neurodegenerative process cannot fully be ruled out, but there is likely contribution from recent fractionated sleep patterns, her chronic pain, generalized anxiety, mood issues.  Could also be a vascular component given evidence of executive dysfunction on testing and presence of vascular risk factors and her medical history. Recommended repeat neuropsychological testing in 12 to 18 months.   Recommendations -Hearing evaluation with audiologist -Better sleep habits -Sleep apnea, OSA? -Recommended psychotherapy -Staying active, brain healthy diet  09/28/21 SS: [name]  here today for follow-up. MOCA 23/30. Was 25/30 at last visit. Thinks memory is declining. Her mother had dementia, she is paranoid this is happening to her. Lives alone, does everything independently. Worries her, goes to room can't remember what she was going to do. Drives a car well, but is getting concerned about night driving, judging distances. 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 Keeps lists on her phone for groceries. Pays her own bills. CT brain Feb 2022 showed age related changes, can't have any MRI due to spinal stimulator. Had cognitive training for speech therapy. Was helpful and gave her confidence. A lot of involvement in church. Does have anxiety is on Prozac, Wellbutrin.   HISTORY  03/02/2021 Dr. Anne Hahn: Ms. Italiano is a 73 year old right-handed white female with a history of a mild memory disorder.  The patient has not given up any activities of daily living because of this.  She at times will have some difficulty remembering directions to areas that she has been before.  She manages her medications, appointments, and finances quite well.  She reports a short-term memory issue, sometimes she will walk into a room and cannot remember why she is there.  She may have some word finding difficulty, and difficulty remembering names for things.  The patient usually sleeps fairly well.  She does have some spine pain, she has a spinal stimulator in place.  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 Lajoyce Corners for this.  She has had multiple falls recently, most of them have been related to prosthetic failure or tripping over something.  She returns to the office today for further evaluation.  REVIEW OF SYSTEMS: Out of a complete 14 system review of symptoms, the patient complains only of the following symptoms, and all other reviewed systems are negative.  See HPI  ALLERGIES: Allergies  Allergen Reactions    Dextromethorphan     Other Reaction(s): cognitive impairment    HOME MEDICATIONS: Outpatient Medications Prior to Visit  Medication Sig Dispense Refill   acidophilus (RISAQUAD) CAPS capsule Take 1 capsule by mouth daily.     albuterol (PROAIR HFA) 108 (90 Base) MCG/ACT inhaler 2 puffs every 4 hours as needed only  if your can't catch your breath 18 g 11   B Complex-Folic Acid (B COMPLEX PLUS PO) 1 tablet     benzonatate (TESSALON) 200 MG capsule TAKE 1 CAPSULE(200 MG) BY MOUTH THREE TIMES DAILY AS NEEDED FOR COUGH 30 capsule 1   Biotin 5000 MCG TABS Take 10,000 mcg by mouth every evening.     buPROPion (WELLBUTRIN XL) 300 MG 24 hr tablet Take 300 mg by mouth daily.     Calcium Carbonate (CALCIUM 600 PO) Take by mouth daily.     Cholecalciferol (VITAMIN D-3) 125 MCG (5000 UT) TABS Take by mouth daily.     Cyanocobalamin (B-12 PO) Take 5,000 mcg by mouth daily.      denosumab (PROLIA) 60 MG/ML SOLN injection Inject 60 mg into the skin every 6 (six) months.      FLUoxetine (PROZAC) 20 MG capsule Take 20 mg by mouth daily.     losartan (COZAAR) 50 MG tablet Take 1 tablet (50 mg total) by mouth daily. 90 tablet 3   MAGNESIUM GLYCINATE PO Take by mouth.     methocarbamol (ROBAXIN) 500 MG tablet Take 500 mg by mouth every 8 (eight) hours as needed.     montelukast (SINGULAIR) 10 MG tablet TAKE 1 TABLET(10 MG) BY MOUTH DAILY 90 tablet 1   Multiple Vitamins-Minerals (CENTRUM SILVER PO) Take 1 tablet by mouth daily.     pantoprazole (PROTONIX) 40 MG tablet TAKE 30 TO 60 MINUTES BEFORE THE FIRST AND LAST MEAL OF THE DAY 180 tablet 0   Potassium 99 MG TABS Take by mouth.     rosuvastatin (CRESTOR) 10 MG tablet Take 1 tablet (10 mg total) by mouth every evening. 90 tablet 3   Theanine 50 MG TBDP Take by mouth.     traMADol (ULTRAM) 50 MG tablet Take 50 mg by mouth 3 (three) times daily as needed.     triamcinolone cream (KENALOG) 0.1 % Apply 1 Application topically 2 (two) times daily.     Turmeric  (QC TUMERIC COMPLEX PO) Take 1,400 mg by mouth daily.     valACYclovir (VALTREX) 1000 MG tablet Take 1,000 mg by mouth daily as needed.      chlorpheniramine (CHLOR-TRIMETON) 4 MG tablet Take 8 mg by mouth 2 (two) times a day.  (Patient not taking: Reported on 08/24/2023)     oxyCODONE (ROXICODONE) 5 MG immediate release tablet Take 1 tablet (5 mg total) by mouth every 4 (four) hours as needed for severe pain (pain score 7-10). 10 tablet 0   No facility-administered medications prior to visit.    PAST MEDICAL HISTORY: Past Medical History:  Diagnosis Date   Anxiety    Arthritis  Asthma    Avascular necrosis of talus (HCC)    Cancer (HCC)    vulva pre cancer had surgery   Depression    Gait abnormality 03/20/2017   GERD (gastroesophageal reflux disease)    Hearing loss    "very minor"   History of pneumonia    Hyperlipidemia    Hypertension    Leg edema    left   Memory difficulty 03/20/2017   PONV (postoperative nausea and vomiting)    Stress incontinence     PAST SURGICAL HISTORY: Past Surgical History:  Procedure Laterality Date   ANKLE FUSION  2011   right multiple    ANKLE FUSION     rear ankle fusion   ANKLE SURGERY  2010   right cordicompression   AORTIC ARCH ANGIOGRAPHY N/A 12/11/2017   Procedure: AORTIC ARCH ANGIOGRAPHY;  Surgeon: [name], MD;  Location: [facility];  Service: Cardiovascular;  Laterality: N/A;   APPENDECTOMY  05/10/2016   BELOW KNEE LEG AMPUTATION     CATARACT EXTRACTION W/ INTRAOCULAR LENS IMPLANT Left    LAPAROSCOPIC APPENDECTOMY N/A 05/10/2016   Procedure: APPENDECTOMY LAPAROSCOPIC;  Surgeon: [name], MD;  Location: [facility];  Service: General;  Laterality: N/A;   LUMBAR LAMINECTOMY/DECOMPRESSION MICRODISCECTOMY N/A 09/09/2015   Procedure:  L4-S1 Decompression/ Discetomy;  Surgeon: [name], MD;  Location: [facility];  Service: Orthopedics;  Laterality: N/A;   SKIN GRAFT     SPINAL CORD STIMULATOR BATTERY EXCHANGE   06/23/2023   SPINAL CORD STIMULATOR INSERTION N/A 01/10/2019   Procedure: LUMBAR SPINAL CORD STIMULATOR INSERTION;  Surgeon: [name], MD;  Location: [facility];  Service: Orthopedics;  Laterality: N/A;  2.5 hrs   TONSILLECTOMY     TUBAL LIGATION  1983   VULVECTOMY N/A 01/28/2015   Procedure: WIDE EXCISION VULVECTOMY;  Surgeon: [name], MD;  Location: [facility];  Service: Gynecology;  Laterality: N/A;    FAMILY HISTORY: Family History  Problem Relation Age of Onset   Dementia Mother 49       alive   Fibromyalgia Mother    Allergies Mother    Heart attack Father 16       deceased   Multiple myeloma Father    Heart murmur Sister 29       she had open heart surgery   Other Sister 23       She is bIpolar- diabetic   Breast cancer Sister    Other Brother        alive   Heart disease Brother        emergent CABG for 99% blocked CAD    SOCIAL HISTORY: Social History   Socioeconomic History   Marital status: Divorced    Spouse name: Not on file   Number of children: 3   Years of education: 14   Highest education level: Not on file  Occupational History   Occupation: ACCOUNTING    Employer: [employer]  Tobacco Use   Smoking status: Former    Current packs/day: 0.00    Average packs/day: 1 pack/day for 40.0 years (40.0 ttl pk-yrs)    Types: Cigarettes    Start date: 02/05/1967    Quit date: 02/05/2007    Years since quitting: 16.5   Smokeless tobacco: Never  Vaping Use   Vaping status: Never Used  Substance and Sexual Activity   Alcohol use: Yes    Alcohol/week: 0.0 standard drinks of alcohol    Comment: socially   Drug use:  No   Sexual activity: Not on file  Other Topics Concern   Not on file  Social History Narrative   Lives alone   Caffeine use: Tea every morning    Right handed    Social Drivers of Health   Financial Resource Strain: Not on file  Food Insecurity: Not on file  Transportation Needs: Not on file  Physical Activity: Not on file   Stress: Not on file  Social Connections: Not on file  Intimate Partner Violence: Not on file   PHYSICAL EXAM  Vitals:   08/24/23 0850  BP: 108/62  Pulse: 98  Weight: 165 lb 9.6 oz (75.1 kg)  Height: 5\' 5"  (1.651 m)    Body mass index is 27.56 kg/m.    08/24/2023    8:53 AM 02/07/2023    7:54 AM 09/28/2021    1:59 PM 03/02/2021    7:42 AM  Montreal Cognitive Assessment   Visuospatial/ Executive (0/5) 4 4 4 4   Naming (0/3) 3 2 3 3   Attention: Read list of digits (0/2) 2 1 2 2   Attention: Read list of letters (0/1) 0 1 1 1   Attention: Serial 7 subtraction starting at 100 (0/3) 2 3 3 3   Language: Repeat phrase (0/2) 2 1 1 2   Language : Fluency (0/1) 1 1 1 1   Abstraction (0/2) 2 2 1 2   Delayed Recall (0/5) 0 0 1 1  Orientation (0/6) 6 6 6 6   Total 22 21 23 25   Adjusted Score (based on education) 22         05/19/2022   12:44 PM 09/18/2017   12:07 PM  MMSE - Mini Mental State Exam  Orientation to time 4 5  Orientation to Place 5 5  Registration 3 3  Attention/ Calculation 1 5  Recall 2 2  Language- name 2 objects 2 2  Language- repeat 1 1  Language- follow 3 step command 3 3  Language- read & follow direction 1 1  Write a sentence 1 1  Copy design 1 1  Total score 24 29   Generalized: Well developed, in no acute distress  Neurological examination  Mentation: Alert oriented to time, place, history taking. Follows all commands speech and language fluent Cranial nerve II-XII: Pupils were equal round reactive to light. Extraocular movements were full, visual field were full on confrontational test. Facial sensation and strength were normal.  Head turning and shoulder shrug  were normal and symmetric. Motor: right BKA wearing prosthesis.  Sensory: Sensory testing is intact to soft touch on all 4 extremities. No evidence of extinction is noted.  Coordination: Cerebellar testing reveals good finger-nose-finger bilaterally Gait and station: Gait is normal.  Reflexes: Deep  tendon reflexes are symmetric and normal bilaterally.   DIAGNOSTIC DATA (LABS, IMAGING, TESTING) - I reviewed patient records, labs, notes, testing and imaging myself where available.  Lab Results  Component Value Date   WBC 8.0 04/01/2023   HGB 11.4 (L) 04/01/2023   HCT 35.5 (L) 04/01/2023   MCV 96.7 04/01/2023   PLT 286 04/01/2023      Component Value Date/Time   NA 136 04/01/2023 1847   NA 142 09/18/2017 1244   K 4.8 04/01/2023 1847   CL 100 04/01/2023 1847   CO2 27 04/01/2023 1847   GLUCOSE 108 (H) 04/01/2023 1847   BUN 15 04/01/2023 1847   BUN 15 09/18/2017 1244   CREATININE 0.78 04/01/2023 1847   CREATININE 0.83 02/12/2015 0847   CALCIUM 9.7 04/01/2023  1847   PROT 7.4 04/01/2023 1847   ALBUMIN 4.2 04/01/2023 1847   AST 27 04/01/2023 1847   ALT 19 04/01/2023 1847   ALKPHOS 39 04/01/2023 1847   BILITOT 0.3 04/01/2023 1847   GFRNONAA >60 04/01/2023 1847   GFRAA >60 01/11/2019 1432   No results found for: "CHOL", "HDL", "LDLCALC", "LDLDIRECT", "TRIG", "CHOLHDL" No results found for: "HGBA1C" Lab Results  Component Value Date   VITAMINB12 >2000 (H) 03/20/2017   No results found for: "TSH"  Margie Ege, AGNP-C, DNP 08/24/2023, 9:09 AM [facility] [address] [place], [place] [address] [phone]

## 2023-08-24 ENCOUNTER — Encounter: Payer: Medicare Other | Admitting: Vascular Surgery

## 2023-08-24 ENCOUNTER — Ambulatory Visit (INDEPENDENT_AMBULATORY_CARE_PROVIDER_SITE_OTHER): Payer: Medicare Other | Admitting: Neurology

## 2023-08-24 ENCOUNTER — Encounter: Payer: Self-pay | Admitting: Neurology

## 2023-08-24 VITALS — BP 108/62 | HR 98 | Ht 65.0 in | Wt 165.6 lb

## 2023-08-24 DIAGNOSIS — F419 Anxiety disorder, unspecified: Secondary | ICD-10-CM | POA: Diagnosis not present

## 2023-08-24 DIAGNOSIS — G3184 Mild cognitive impairment, so stated: Secondary | ICD-10-CM | POA: Diagnosis not present

## 2023-08-24 MED ORDER — DONEPEZIL HCL 10 MG PO TABS: 10.0000 mg | ORAL_TABLET | Freq: Every day | ORAL | 5 refills | Status: DC

## 2023-08-24 NOTE — Patient Instructions (Addendum)
 Start Aricept, start taking 5 mg at bedtime for 2 weeks then increase to 10 mg at bedtime, monitor for GI upset and vivid dreams. After 1-2 months will start Namenda.  Check labs today to screen for Alzheimer's disease   Will follow up once the labs are resulted  Follow up with Dr. Teresa Coombs in 6 months

## 2023-08-25 ENCOUNTER — Encounter: Payer: Self-pay | Admitting: *Deleted

## 2023-08-25 ENCOUNTER — Other Ambulatory Visit: Payer: Self-pay | Admitting: Internal Medicine

## 2023-08-25 DIAGNOSIS — J45991 Cough variant asthma: Secondary | ICD-10-CM

## 2023-08-28 DIAGNOSIS — M810 Age-related osteoporosis without current pathological fracture: Secondary | ICD-10-CM | POA: Diagnosis not present

## 2023-08-29 NOTE — Telephone Encounter (Signed)
 ?  [place] TO REFILL

## 2023-08-30 ENCOUNTER — Inpatient Hospital Stay (HOSPITAL_BASED_OUTPATIENT_CLINIC_OR_DEPARTMENT_OTHER)
Admission: EM | Admit: 2023-08-30 | Discharge: 2023-09-06 | DRG: 177 | Disposition: A | Discharge status: Home Health | Attending: Student | Admitting: Student

## 2023-08-30 ENCOUNTER — Emergency Department (HOSPITAL_BASED_OUTPATIENT_CLINIC_OR_DEPARTMENT_OTHER): Admitting: Radiology

## 2023-08-30 ENCOUNTER — Other Ambulatory Visit: Payer: Self-pay

## 2023-08-30 ENCOUNTER — Inpatient Hospital Stay (HOSPITAL_COMMUNITY)

## 2023-08-30 ENCOUNTER — Encounter (HOSPITAL_BASED_OUTPATIENT_CLINIC_OR_DEPARTMENT_OTHER): Payer: Self-pay

## 2023-08-30 DIAGNOSIS — E860 Dehydration: Secondary | ICD-10-CM | POA: Diagnosis not present

## 2023-08-30 DIAGNOSIS — R7989 Other specified abnormal findings of blood chemistry: Secondary | ICD-10-CM | POA: Diagnosis present

## 2023-08-30 DIAGNOSIS — E785 Hyperlipidemia, unspecified: Secondary | ICD-10-CM | POA: Diagnosis present

## 2023-08-30 DIAGNOSIS — G929 Unspecified toxic encephalopathy: Secondary | ICD-10-CM | POA: Diagnosis present

## 2023-08-30 DIAGNOSIS — Z888 Allergy status to other drugs, medicaments and biological substances status: Secondary | ICD-10-CM | POA: Diagnosis not present

## 2023-08-30 DIAGNOSIS — J189 Pneumonia, unspecified organism: Secondary | ICD-10-CM | POA: Diagnosis present

## 2023-08-30 DIAGNOSIS — Z1152 Encounter for screening for COVID-19: Secondary | ICD-10-CM | POA: Diagnosis not present

## 2023-08-30 DIAGNOSIS — F0283 Dementia in other diseases classified elsewhere, unspecified severity, with mood disturbance: Secondary | ICD-10-CM | POA: Diagnosis not present

## 2023-08-30 DIAGNOSIS — Z87891 Personal history of nicotine dependence: Secondary | ICD-10-CM | POA: Diagnosis not present

## 2023-08-30 DIAGNOSIS — H9190 Unspecified hearing loss, unspecified ear: Secondary | ICD-10-CM | POA: Diagnosis present

## 2023-08-30 DIAGNOSIS — Z72820 Sleep deprivation: Secondary | ICD-10-CM | POA: Diagnosis not present

## 2023-08-30 DIAGNOSIS — J4489 Other specified chronic obstructive pulmonary disease: Secondary | ICD-10-CM | POA: Diagnosis not present

## 2023-08-30 DIAGNOSIS — E871 Hypo-osmolality and hyponatremia: Secondary | ICD-10-CM | POA: Diagnosis present

## 2023-08-30 DIAGNOSIS — Z7951 Long term (current) use of inhaled steroids: Secondary | ICD-10-CM | POA: Diagnosis not present

## 2023-08-30 DIAGNOSIS — G3184 Mild cognitive impairment, so stated: Secondary | ICD-10-CM | POA: Diagnosis present

## 2023-08-30 DIAGNOSIS — G309 Alzheimer's disease, unspecified: Secondary | ICD-10-CM | POA: Diagnosis present

## 2023-08-30 DIAGNOSIS — J690 Pneumonitis due to inhalation of food and vomit: Secondary | ICD-10-CM | POA: Diagnosis not present

## 2023-08-30 DIAGNOSIS — Z833 Family history of diabetes mellitus: Secondary | ICD-10-CM

## 2023-08-30 DIAGNOSIS — R0602 Shortness of breath: Secondary | ICD-10-CM | POA: Diagnosis not present

## 2023-08-30 DIAGNOSIS — E876 Hypokalemia: Secondary | ICD-10-CM | POA: Diagnosis present

## 2023-08-30 DIAGNOSIS — F0284 Dementia in other diseases classified elsewhere, unspecified severity, with anxiety: Secondary | ICD-10-CM | POA: Diagnosis not present

## 2023-08-30 DIAGNOSIS — Z8249 Family history of ischemic heart disease and other diseases of the circulatory system: Secondary | ICD-10-CM | POA: Diagnosis not present

## 2023-08-30 DIAGNOSIS — I959 Hypotension, unspecified: Secondary | ICD-10-CM | POA: Diagnosis not present

## 2023-08-30 DIAGNOSIS — K5730 Diverticulosis of large intestine without perforation or abscess without bleeding: Secondary | ICD-10-CM | POA: Diagnosis not present

## 2023-08-30 DIAGNOSIS — Z807 Family history of other malignant neoplasms of lymphoid, hematopoietic and related tissues: Secondary | ICD-10-CM

## 2023-08-30 DIAGNOSIS — Z803 Family history of malignant neoplasm of breast: Secondary | ICD-10-CM

## 2023-08-30 DIAGNOSIS — J9601 Acute respiratory failure with hypoxia: Secondary | ICD-10-CM | POA: Diagnosis not present

## 2023-08-30 DIAGNOSIS — R079 Chest pain, unspecified: Secondary | ICD-10-CM | POA: Diagnosis not present

## 2023-08-30 DIAGNOSIS — Z79899 Other long term (current) drug therapy: Secondary | ICD-10-CM | POA: Diagnosis not present

## 2023-08-30 DIAGNOSIS — Z89511 Acquired absence of right leg below knee: Secondary | ICD-10-CM | POA: Diagnosis not present

## 2023-08-30 DIAGNOSIS — R1032 Left lower quadrant pain: Secondary | ICD-10-CM | POA: Diagnosis not present

## 2023-08-30 DIAGNOSIS — I10 Essential (primary) hypertension: Secondary | ICD-10-CM | POA: Diagnosis present

## 2023-08-30 DIAGNOSIS — J9611 Chronic respiratory failure with hypoxia: Secondary | ICD-10-CM | POA: Insufficient documentation

## 2023-08-30 DIAGNOSIS — B9789 Other viral agents as the cause of diseases classified elsewhere: Secondary | ICD-10-CM | POA: Diagnosis not present

## 2023-08-30 DIAGNOSIS — K219 Gastro-esophageal reflux disease without esophagitis: Secondary | ICD-10-CM | POA: Diagnosis present

## 2023-08-30 DIAGNOSIS — F02818 Dementia in other diseases classified elsewhere, unspecified severity, with other behavioral disturbance: Secondary | ICD-10-CM | POA: Diagnosis present

## 2023-08-30 DIAGNOSIS — R918 Other nonspecific abnormal finding of lung field: Secondary | ICD-10-CM | POA: Diagnosis not present

## 2023-08-30 DIAGNOSIS — Z8701 Personal history of pneumonia (recurrent): Secondary | ICD-10-CM

## 2023-08-30 DIAGNOSIS — R059 Cough, unspecified: Secondary | ICD-10-CM | POA: Diagnosis not present

## 2023-08-30 DIAGNOSIS — Z820 Family history of epilepsy and other diseases of the nervous system: Secondary | ICD-10-CM

## 2023-08-30 DIAGNOSIS — J168 Pneumonia due to other specified infectious organisms: Secondary | ICD-10-CM | POA: Diagnosis not present

## 2023-08-30 DIAGNOSIS — R935 Abnormal findings on diagnostic imaging of other abdominal regions, including retroperitoneum: Secondary | ICD-10-CM | POA: Diagnosis not present

## 2023-08-30 LAB — CBC WITH DIFFERENTIAL/PLATELET
Abs Immature Granulocytes: 0.02 10*3/uL (ref 0.00–0.07)
Basophils Absolute: 0 10*3/uL (ref 0.0–0.1)
Basophils Relative: 0 %
Eosinophils Absolute: 0.5 10*3/uL (ref 0.0–0.5)
Eosinophils Relative: 10 %
HCT: 34.7 % — ABNORMAL LOW (ref 36.0–46.0)
Hemoglobin: 11.4 g/dL — ABNORMAL LOW (ref 12.0–15.0)
Immature Granulocytes: 0 %
Lymphocytes Relative: 9 %
Lymphs Abs: 0.5 10*3/uL — ABNORMAL LOW (ref 0.7–4.0)
MCH: 31.6 pg (ref 26.0–34.0)
MCHC: 32.9 g/dL (ref 30.0–36.0)
MCV: 96.1 fL (ref 80.0–100.0)
Monocytes Absolute: 0.3 10*3/uL (ref 0.1–1.0)
Monocytes Relative: 6 %
Neutro Abs: 4.1 10*3/uL (ref 1.7–7.7)
Neutrophils Relative %: 75 %
Platelets: 190 10*3/uL (ref 150–400)
RBC: 3.61 MIL/uL — ABNORMAL LOW (ref 3.87–5.11)
RDW: 13.2 % (ref 11.5–15.5)
WBC Morphology: INCREASED
WBC: 5.5 10*3/uL (ref 4.0–10.5)
nRBC: 0 % (ref 0.0–0.2)

## 2023-08-30 LAB — COMPREHENSIVE METABOLIC PANEL
ALT: 31 U/L (ref 0–44)
AST: 58 U/L — ABNORMAL HIGH (ref 15–41)
Albumin: 3.7 g/dL (ref 3.5–5.0)
Alkaline Phosphatase: 39 U/L (ref 38–126)
Anion gap: 10 (ref 5–15)
BUN: 22 mg/dL (ref 8–23)
CO2: 26 mmol/L (ref 22–32)
Calcium: 8.5 mg/dL — ABNORMAL LOW (ref 8.9–10.3)
Chloride: 95 mmol/L — ABNORMAL LOW (ref 98–111)
Creatinine, Ser: 0.92 mg/dL (ref 0.44–1.00)
GFR, Estimated: >60 mL/min (ref >60)
Glucose, Bld: 106 mg/dL — ABNORMAL HIGH (ref 70–99)
Potassium: 4.4 mmol/L (ref 3.5–5.1)
Sodium: 131 mmol/L — ABNORMAL LOW (ref 135–145)
Total Bilirubin: 0.6 mg/dL (ref 0.0–1.2)
Total Protein: 6.6 g/dL (ref 6.5–8.1)

## 2023-08-30 LAB — RESP PANEL BY RT-PCR (RSV, FLU A&B, COVID)  RVPGX2
Influenza A by PCR: NEGATIVE
Influenza B by PCR: NEGATIVE
Resp Syncytial Virus by PCR: NEGATIVE
SARS Coronavirus 2 by RT PCR: NEGATIVE

## 2023-08-30 LAB — CBC
HCT: 30.3 % — ABNORMAL LOW (ref 36.0–46.0)
Hemoglobin: 10.1 g/dL — ABNORMAL LOW (ref 12.0–15.0)
MCH: 32.2 pg (ref 26.0–34.0)
MCHC: 33.3 g/dL (ref 30.0–36.0)
MCV: 96.5 fL (ref 80.0–100.0)
Platelets: 165 10*3/uL (ref 150–400)
RBC: 3.14 MIL/uL — ABNORMAL LOW (ref 3.87–5.11)
RDW: 13.2 % (ref 11.5–15.5)
WBC: 4.5 10*3/uL (ref 4.0–10.5)
nRBC: 0 % (ref 0.0–0.2)

## 2023-08-30 LAB — URINALYSIS, W/ REFLEX TO CULTURE (INFECTION SUSPECTED)
Bacteria, UA: NONE SEEN
Bilirubin Urine: NEGATIVE
Glucose, UA: NEGATIVE mg/dL
Leukocytes,Ua: NEGATIVE
Nitrite: NEGATIVE
Protein, ur: 30 mg/dL — AB
Specific Gravity, Urine: 1.023 (ref 1.005–1.030)
pH: 6.5 (ref 5.0–8.0)

## 2023-08-30 LAB — TROPONIN I (HIGH SENSITIVITY)
Troponin I (High Sensitivity): 16 ng/L (ref <18)
Troponin I (High Sensitivity): 17 ng/L (ref <18)

## 2023-08-30 LAB — PROTIME-INR
INR: 1.2 (ref 0.8–1.2)
Prothrombin Time: 15.9 s — ABNORMAL HIGH (ref 11.4–15.2)

## 2023-08-30 LAB — ATN PROFILE
A -- Beta-amyloid 42/40 Ratio: 0.097 — ABNORMAL LOW (ref >0.102)
Beta-amyloid 40: 182.98 pg/mL
Beta-amyloid 42: 17.66 pg/mL
N -- NfL, Plasma: 3.58 pg/mL (ref 0.00–7.64)
T -- p-tau181: 1.61 pg/mL — ABNORMAL HIGH (ref 0.00–0.97)

## 2023-08-30 LAB — APOE ALZHEIMER'S RISK

## 2023-08-30 LAB — BRAIN NATRIURETIC PEPTIDE: B Natriuretic Peptide: 90.8 pg/mL (ref 0.0–100.0)

## 2023-08-30 LAB — LACTIC ACID, PLASMA: Lactic Acid, Venous: 1.6 mmol/L (ref 0.5–1.9)

## 2023-08-30 LAB — C-REACTIVE PROTEIN: CRP: 31.7 mg/dL — ABNORMAL HIGH (ref <1.0)

## 2023-08-30 LAB — PROCALCITONIN: Procalcitonin: 11.21 ng/mL

## 2023-08-30 LAB — CREATININE, SERUM
Creatinine, Ser: 1.18 mg/dL — ABNORMAL HIGH (ref 0.44–1.00)
GFR, Estimated: 48 mL/min — ABNORMAL LOW (ref >60)

## 2023-08-30 LAB — URIC ACID: Uric Acid, Serum: 2.9 mg/dL (ref 2.5–7.1)

## 2023-08-30 LAB — OSMOLALITY: Osmolality: 284 mosm/kg (ref 275–295)

## 2023-08-30 LAB — TSH: TSH: 1.153 u[IU]/mL (ref 0.350–4.500)

## 2023-08-30 MED ORDER — ROSUVASTATIN CALCIUM 5 MG PO TABS
10.0000 mg | ORAL_TABLET | Freq: Every evening | ORAL | Status: DC
  Administered 2023-08-30 – 2023-09-05 (×7): 10 mg via ORAL
  Filled 2023-08-30 (×7): qty 2

## 2023-08-30 MED ORDER — LACTATED RINGERS IV BOLUS
500.0000 mL | Freq: Once | INTRAVENOUS | Status: AC
  Administered 2023-08-30: 500 mL via INTRAVENOUS

## 2023-08-30 MED ORDER — MONTELUKAST SODIUM 10 MG PO TABS
10.0000 mg | ORAL_TABLET | Freq: Every day | ORAL | Status: DC
  Administered 2023-08-30 – 2023-09-05 (×7): 10 mg via ORAL
  Filled 2023-08-30 (×7): qty 1

## 2023-08-30 MED ORDER — HALOPERIDOL LACTATE 5 MG/ML IJ SOLN
2.0000 mg | Freq: Four times a day (QID) | INTRAMUSCULAR | Status: DC | PRN
  Administered 2023-09-03 – 2023-09-05 (×2): 2 mg via INTRAMUSCULAR
  Filled 2023-08-30 (×3): qty 1

## 2023-08-30 MED ORDER — ADULT MULTIVITAMIN W/MINERALS CH
1.0000 | ORAL_TABLET | Freq: Every day | ORAL | Status: DC
  Administered 2023-08-30 – 2023-09-06 (×8): 1 via ORAL
  Filled 2023-08-30 (×8): qty 1

## 2023-08-30 MED ORDER — SODIUM CHLORIDE 0.9 % IV SOLN
3.0000 g | Freq: Four times a day (QID) | INTRAVENOUS | Status: DC
  Administered 2023-08-30 – 2023-09-03 (×14): 3 g via INTRAVENOUS
  Filled 2023-08-30 (×14): qty 8

## 2023-08-30 MED ORDER — SODIUM CHLORIDE 0.9 % IV SOLN
100.0000 mg | Freq: Two times a day (BID) | INTRAVENOUS | Status: DC
  Filled 2023-08-30 (×2): qty 100

## 2023-08-30 MED ORDER — SENNOSIDES-DOCUSATE SODIUM 8.6-50 MG PO TABS: 1.0000 | ORAL_TABLET | Freq: Every evening | ORAL | Status: DC | PRN

## 2023-08-30 MED ORDER — TRAMADOL HCL 50 MG PO TABS
50.0000 mg | ORAL_TABLET | Freq: Three times a day (TID) | ORAL | Status: DC | PRN
  Administered 2023-08-30 – 2023-09-02 (×7): 50 mg via ORAL
  Filled 2023-08-30 (×8): qty 1

## 2023-08-30 MED ORDER — SODIUM CHLORIDE 0.9 % IV SOLN
100.0000 mg | Freq: Once | INTRAVENOUS | Status: AC
  Administered 2023-08-30: 100 mg via INTRAVENOUS
  Filled 2023-08-30: qty 100

## 2023-08-30 MED ORDER — RISAQUAD PO CAPS
1.0000 | ORAL_CAPSULE | Freq: Every day | ORAL | Status: DC
  Administered 2023-08-31 – 2023-09-06 (×7): 1 via ORAL
  Filled 2023-08-30 (×7): qty 1

## 2023-08-30 MED ORDER — SODIUM CHLORIDE 0.9 % IV SOLN: INTRAVENOUS | Status: DC | PRN

## 2023-08-30 MED ORDER — ONDANSETRON HCL 4 MG/2ML IJ SOLN
4.0000 mg | Freq: Four times a day (QID) | INTRAMUSCULAR | Status: DC | PRN
  Administered 2023-08-30 – 2023-09-01 (×4): 4 mg via INTRAVENOUS
  Filled 2023-08-30 (×4): qty 2

## 2023-08-30 MED ORDER — ACETAMINOPHEN 500 MG PO TABS
500.0000 mg | ORAL_TABLET | Freq: Four times a day (QID) | ORAL | Status: DC | PRN
  Administered 2023-08-31 – 2023-09-02 (×4): 500 mg via ORAL
  Filled 2023-08-30 (×4): qty 1

## 2023-08-30 MED ORDER — ASPIRIN 81 MG PO CHEW
81.0000 mg | CHEWABLE_TABLET | Freq: Every day | ORAL | Status: DC
  Administered 2023-08-30 – 2023-09-06 (×8): 81 mg via ORAL
  Filled 2023-08-30 (×8): qty 1

## 2023-08-30 MED ORDER — VITAMIN D 25 MCG (1000 UNIT) PO TABS
2000.0000 [IU] | ORAL_TABLET | Freq: Every day | ORAL | Status: DC
  Administered 2023-08-31 – 2023-09-06 (×7): 2000 [IU] via ORAL
  Filled 2023-08-30 (×7): qty 2

## 2023-08-30 MED ORDER — ALBUTEROL SULFATE (2.5 MG/3ML) 0.083% IN NEBU
3.0000 mL | INHALATION_SOLUTION | Freq: Four times a day (QID) | RESPIRATORY_TRACT | Status: DC | PRN
  Administered 2023-08-30 – 2023-08-31 (×2): 3 mL via RESPIRATORY_TRACT
  Filled 2023-08-30 (×3): qty 3

## 2023-08-30 MED ORDER — LACTATED RINGERS IV SOLN: INTRAVENOUS | Status: DC

## 2023-08-30 MED ORDER — KETOROLAC TROMETHAMINE 15 MG/ML IJ SOLN
15.0000 mg | Freq: Once | INTRAMUSCULAR | Status: AC
  Administered 2023-08-30: 15 mg via INTRAVENOUS
  Filled 2023-08-30: qty 1

## 2023-08-30 MED ORDER — PANTOPRAZOLE SODIUM 40 MG PO TBEC
40.0000 mg | DELAYED_RELEASE_TABLET | Freq: Every day | ORAL | Status: DC
  Administered 2023-08-30 – 2023-09-06 (×8): 40 mg via ORAL
  Filled 2023-08-30 (×8): qty 1

## 2023-08-30 MED ORDER — ACETAMINOPHEN 325 MG PO TABS
650.0000 mg | ORAL_TABLET | Freq: Once | ORAL | Status: AC
  Administered 2023-08-30: 650 mg via ORAL
  Filled 2023-08-30: qty 2

## 2023-08-30 MED ORDER — B-12 500 MCG PO TABS: 500.0000 ug | ORAL_TABLET | Freq: Every day | ORAL | Status: DC

## 2023-08-30 MED ORDER — BENZONATATE 100 MG PO CAPS
100.0000 mg | ORAL_CAPSULE | ORAL | Status: DC | PRN
  Administered 2023-08-31 – 2023-09-02 (×7): 100 mg via ORAL
  Filled 2023-08-30 (×7): qty 1

## 2023-08-30 MED ORDER — HEPARIN SODIUM (PORCINE) 5000 UNIT/ML IJ SOLN
5000.0000 [IU] | Freq: Three times a day (TID) | INTRAMUSCULAR | Status: DC
  Administered 2023-08-30 – 2023-09-06 (×21): 5000 [IU] via SUBCUTANEOUS
  Filled 2023-08-30 (×21): qty 1

## 2023-08-30 MED ORDER — SODIUM CHLORIDE 0.9 % IV SOLN
500.0000 mg | INTRAVENOUS | Status: DC
  Administered 2023-08-30: 500 mg via INTRAVENOUS
  Filled 2023-08-30: qty 5

## 2023-08-30 MED ORDER — DONEPEZIL HCL 10 MG PO TABS
10.0000 mg | ORAL_TABLET | Freq: Every day | ORAL | Status: DC
  Administered 2023-08-30 – 2023-09-05 (×7): 10 mg via ORAL
  Filled 2023-08-30 (×7): qty 1

## 2023-08-30 MED ORDER — SODIUM CHLORIDE 0.9 % IV SOLN
1.0000 g | Freq: Once | INTRAVENOUS | Status: AC
  Administered 2023-08-30: 1 g via INTRAVENOUS
  Filled 2023-08-30: qty 10

## 2023-08-30 MED ORDER — SODIUM CHLORIDE 0.9 % IV BOLUS
1000.0000 mL | Freq: Once | INTRAVENOUS | Status: AC
  Administered 2023-08-30: 1000 mL via INTRAVENOUS

## 2023-08-30 MED ORDER — METHOCARBAMOL 500 MG PO TABS
500.0000 mg | ORAL_TABLET | Freq: Three times a day (TID) | ORAL | Status: DC | PRN
  Administered 2023-09-01 (×2): 500 mg via ORAL
  Filled 2023-08-30 (×2): qty 1

## 2023-08-30 MED ORDER — BUPROPION HCL ER (XL) 150 MG PO TB24
300.0000 mg | ORAL_TABLET | Freq: Every day | ORAL | Status: DC
  Administered 2023-08-30 – 2023-09-06 (×8): 300 mg via ORAL
  Filled 2023-08-30 (×8): qty 2

## 2023-08-30 MED ORDER — SODIUM CHLORIDE 0.9 % IV SOLN: INTRAVENOUS | Status: DC

## 2023-08-30 MED ORDER — IOHEXOL 350 MG/ML SOLN
75.0000 mL | Freq: Once | INTRAVENOUS | Status: AC | PRN
  Administered 2023-08-30: 75 mL via INTRAVENOUS

## 2023-08-30 MED ORDER — FLUOXETINE HCL 20 MG PO CAPS
20.0000 mg | ORAL_CAPSULE | Freq: Every day | ORAL | Status: DC
  Administered 2023-08-31 – 2023-09-06 (×7): 20 mg via ORAL
  Filled 2023-08-30 (×7): qty 1

## 2023-08-30 MED ORDER — SODIUM CHLORIDE 0.9 % IV SOLN: 1.0000 g | INTRAVENOUS | Status: DC

## 2023-08-30 NOTE — H&P (Addendum)
 [facility] H&P   Patient Demographics:    Shelly [name], is a 73 y.o. female  MRN: [id number]   DOB - Feb 27, 1951  Admit Date - 08/30/2023  Outpatient Primary MD for the patient is [name], MD  Patient coming from:  Home  Chief Complaint  Patient presents with   Fatigue   Urinary Frequency   Cough      HPI:    Shelly [name]  is a 73 y.o. female, with history of right lower extremity avascular necrosis requiring right BKA 5 years ago, hypertension, dyslipidemia, mild early Alzheimer's dementia, asthma, who has been still living alone but looking for assisted living facility due to gradually progressive dementia, was in good health, few days ago she had a few relatives visiting her and the children were sick with nausea vomiting.  Patient subsequently for the last 2 to 3 days developed multiple episodes of nausea vomiting and mild abdominal pain, she became extremely weak to the point that she had couple of falls and was unable to get up out of the bed, she presented to [facility] with these complaints, their workup was suggestive of severe dehydration, hypotension and right lower lobe likely aspiration pneumonia, she was referred to [facility] for further care.  Currently patient denies any fever chills, mild generalized headache, no chest pain, no palpitations, does have a mildly productive cough but no shortness of breath at rest, has some mild generalized abdominal discomfort, currently not nauseated, no blood in stool or urine, no hematemesis, no dysuria, no focal weakness.  She denies any problems swallowing food or liquids.    Review of systems:    A full 10 point Review of Systems was done, except as  stated above, all other Review of Systems were negative.   With Past History of the following :    Past Medical History:  Diagnosis Date   Anxiety    Arthritis    Asthma    Avascular necrosis of talus (HCC)    Cancer (HCC)    vulva pre cancer had surgery   Depression    Gait abnormality 03/20/2017   GERD (gastroesophageal reflux disease)    Hearing loss    "very minor"   History of pneumonia    Hyperlipidemia    Hypertension    Leg edema  left   Memory difficulty 03/20/2017   PONV (postoperative nausea and vomiting)    Stress incontinence       Past Surgical History:  Procedure Laterality Date   ANKLE FUSION  2011   right multiple    ANKLE FUSION     rear ankle fusion   ANKLE SURGERY  2010   right cordicompression   AORTIC ARCH ANGIOGRAPHY N/A 12/11/2017   Procedure: AORTIC ARCH ANGIOGRAPHY;  Surgeon: [name], MD;  Location: [facility] INV[facility]IVE [facility];  Service: Cardiovascular;  Laterality: N/A;   APPENDECTOMY  05/10/2016   BELOW KNEE LEG AMPUTATION     CATARACT EXTRACTION W/ INTRAOCULAR LENS IMPLANT Left    LAPAROSCOPIC APPENDECTOMY N/A 05/10/2016   Procedure: APPENDECTOMY LAPAROSCOPIC;  Surgeon: [name], MD;  Location: [facility] OR;  Service: General;  Laterality: N/A;   LUMBAR LAMINECTOMY/DECOMPRESSION MICRODISCECTOMY N/A 09/09/2015   Procedure:  L4-S1 Decompression/ Discetomy;  Surgeon: [name], MD;  Location: [facility] OR;  Service: Orthopedics;  Laterality: N/A;   SKIN GRAFT     SPINAL CORD STIMULATOR BATTERY EXCHANGE  06/23/2023   SPINAL CORD STIMULATOR INSERTION N/A 01/10/2019   Procedure: LUMBAR SPINAL CORD STIMULATOR INSERTION;  Surgeon: [name], MD;  Location: [facility] OR;  Service: Orthopedics;  Laterality: N/A;  2.5 hrs   TONSILLECTOMY     TUBAL LIGATION  1983   VULVECTOMY N/A 01/28/2015   Procedure: WIDE EXCISION VULVECTOMY;  Surgeon: [name], MD;  Location: [facility] ORS;  Service: Gynecology;  Laterality: N/A;      Social History:      Social History   Tobacco Use   Smoking status: Former    Current packs/day: 0.00    Average packs/day: 1 pack/day for 40.0 years (40.0 ttl pk-yrs)    Types: Cigarettes    Start date: 02/05/1967    Quit date: 02/05/2007    Years since quitting: 16.5   Smokeless tobacco: Never  Substance Use Topics   Alcohol use: Yes    Alcohol/week: 0.0 standard drinks of alcohol    Comment: socially         Family History :     Family History  Problem Relation Age of Onset   Dementia Mother 50       alive   Fibromyalgia Mother    Allergies Mother    Heart attack Father 26       deceased   Multiple myeloma Father    Heart murmur Sister 62       she had open heart surgery   Other Sister 57       She is bIpolar- diabetic   Breast cancer Sister    Other Brother        alive   Heart disease Brother        emergent CABG for 99% blocked CAD       Home Medications:   Prior to Admission medications   Medication Sig Start Date End Date Taking? Authorizing Provider  ondansetron (ZOFRAN-ODT) 8 MG disintegrating tablet Take 8 mg by mouth See admin instructions. *q6-8 hours for nausea and vomiting* 06/23/23  Yes [provider]  acidophilus (RISAQUAD) CAPS capsule Take 1 capsule by mouth daily.    [provider]  albuterol (PROAIR HFA) 108 (90 Base) MCG/ACT inhaler 2 puffs every 4 hours as needed only  if your can't catch your breath 05/03/23   Nyoka Cowden, MD  B Complex-Folic Acid (B COMPLEX PLUS PO) 1 tablet    [provider]  benzonatate (TESSALON) 200 MG capsule TAKE 1 CAPSULE(200 MG) BY MOUTH THREE TIMES DAILY AS NEEDED FOR COUGH 08/25/23   Nyoka Cowden, MD  Biotin 5000 MCG TABS Take 10,000 mcg by mouth every evening.    [provider]  buPROPion (WELLBUTRIN XL) 300 MG 24 hr tablet Take 300 mg by mouth daily.    [provider]  Calcium Carbonate (CALCIUM 600 PO) Take by mouth daily.    [provider]  Cholecalciferol (VITAMIN  D-3) 125 MCG (5000 UT) TABS Take by mouth daily.    [provider]  Cyanocobalamin (B-12 PO) Take 5,000 mcg by mouth daily.     [provider]  denosumab (PROLIA) 60 MG/ML SOLN injection Inject 60 mg into the skin every 6 (six) months.     [provider]  donepezil (ARICEPT) 10 MG tablet Take 1 tablet (10 mg total) by mouth at bedtime. 08/24/23   Glean Salvo, NP  FLUoxetine (PROZAC) 20 MG capsule Take 20 mg by mouth daily.    [provider]  losartan (COZAAR) 50 MG tablet Take 1 tablet (50 mg total) by mouth daily. 08/08/23   Sharlene Dory, PA-C  MAGNESIUM GLYCINATE PO Take by mouth.    [provider]  methocarbamol (ROBAXIN) 500 MG tablet Take 500 mg by mouth every 8 (eight) hours as needed. 06/10/19   [provider]  montelukast (SINGULAIR) 10 MG tablet TAKE 1 TABLET(10 MG) BY MOUTH DAILY 04/10/23   Nyoka Cowden, MD  Multiple Vitamins-Minerals (CENTRUM SILVER PO) Take 1 tablet by mouth daily.    [provider]  pantoprazole (PROTONIX) 40 MG tablet TAKE 30 TO 60 MINUTES BEFORE THE FIRST AND LAST MEAL OF THE DAY 07/24/23   Nyoka Cowden, MD  Potassium 99 MG TABS Take by mouth.    [provider]  rosuvastatin (CRESTOR) 10 MG tablet Take 1 tablet (10 mg total) by mouth every evening. 08/08/23   Sharlene Dory, PA-C  Theanine 50 MG TBDP Take by mouth.    [provider]  traMADol (ULTRAM) 50 MG tablet Take 50 mg by mouth 3 (three) times daily as needed. 09/03/20   [provider]  triamcinolone cream (KENALOG) 0.1 % Apply 1 Application topically 2 (two) times daily. 04/27/23   [provider]  Turmeric (QC TUMERIC COMPLEX PO) Take 1,400 mg by mouth daily.    [provider]  valACYclovir (VALTREX) 1000 MG tablet Take 1,000 mg by mouth daily as needed.  06/09/09   [provider]     Allergies:     Allergies  Allergen Reactions   Dextromethorphan     Other Reaction(s):  cognitive impairment     Physical Exam:   Vitals  Blood pressure (!) 96/47, pulse 81, temperature 98.3 F (36.8 C), resp. rate 16, height 5\' 5"  (1.651 m), weight 74.9 kg, SpO2 98%.   1. General -pleasant elderly Caucasian female lying in hospital bed in no distress,  2. Normal affect and insight, Not Suicidal or Homicidal, Awake intermittently minimally confused but still oriented x 3,   3. No F.N deficits, ALL C.Nerves Intact, Strength 5/5 all 4 extremities, Sensation intact all 4 extremities, Plantars down going.  4. Ears and Eyes appear Normal, Conjunctivae clear, PERRLA. Moist Oral Mucosa.  5. Supple Neck, No JVD, No cervical lymphadenopathy appriciated, No Carotid Bruits.  6. Symmetrical Chest wall movement, Good air movement bilaterally, CTAB.  7. RRR, No Gallops, Rubs or Murmurs, No Parasternal  Heave.  8. Positive Bowel Sounds, Abdomen Soft, No tenderness, No organomegaly appriciated,No rebound -guarding or rigidity.  9.  No Cyanosis, Normal Skin Turgor, No Skin Rash or Bruise.  Right BKA, stump site clean and healed,  10. Good muscle tone,  joints appear normal , no effusions, Normal ROM.  11. No Palpable Lymph Nodes in Neck or Axillae      Data Review:   Recent Labs  Lab 08/30/23 1037  WBC 5.5  HGB 11.4*  HCT 34.7*  PLT 190  MCV 96.1  MCH 31.6  MCHC 32.9  RDW 13.2  LYMPHSABS 0.5*  MONOABS 0.3  EOSABS 0.5  BASOSABS 0.0    Recent Labs  Lab 08/30/23 1037  NA 131*  K 4.4  CL 95*  CO2 26  ANIONGAP 10  GLUCOSE 106*  BUN 22  CREATININE 0.92  AST 58*  ALT 31  ALKPHOS 39  BILITOT 0.6  ALBUMIN 3.7  LATICACIDVEN 1.6  CALCIUM 8.5*    No results found for: "CHOL", "HDL", "LDLCALC", "LDLDIRECT", "TRIG", "CHOLHDL"  Recent Labs  Lab 08/30/23 1037  LATICACIDVEN 1.6  CALCIUM 8.5*    Recent Labs  Lab 08/30/23 1037  WBC 5.5  PLT 190  LATICACIDVEN 1.6  CREATININE 0.92    Urinalysis    Component Value Date/Time   COLORURINE YELLOW  08/30/2023 1230   APPEARANCEUR CLEAR 08/30/2023 1230   LABSPEC 1.023 08/30/2023 1230   PHURINE 6.5 08/30/2023 1230   GLUCOSEU NEGATIVE 08/30/2023 1230   GLUCOSEU NEGATIVE 09/08/2010 1617   HGBUR MODERATE (A) 08/30/2023 1230   BILIRUBINUR NEGATIVE 08/30/2023 1230   KETONESUR TRACE (A) 08/30/2023 1230   PROTEINUR 30 (A) 08/30/2023 1230   UROBILINOGEN 0.2 09/08/2010 1617   NITRITE NEGATIVE 08/30/2023 1230   LEUKOCYTESUR NEGATIVE 08/30/2023 1230      Imaging Results:    DG Chest 2 View Result Date: 08/30/2023 CLINICAL DATA:  Shortness of breath. EXAM: CHEST - 2 VIEW COMPARISON:  Chest radiograph dated 06/25/2023 FINDINGS: Patchy areas of airspace opacity in the right lower lobe and lingula consistent with pneumonia. Clinical correlation and follow-up to resolution recommended. Similar appearance of a 5 mm nodule in the left lower lung field, likely a granuloma. Attention on follow-up imaging recommended. No large pleural effusion. No pneumothorax. Stable cardiac silhouette. No acute osseous pathology. Thoracic spine stimulator. IMPRESSION: Right lower lobe and lingular pneumonia. Electronically Signed   By: Elgie Collard M.D.   On: 08/30/2023 11:25    My personal review of EKG: Rhythm sinus tachycardia with subtle lateral ST depression.    Assessment & Plan:   1.  Abdominal pain, nausea vomiting, aspiration pneumonia.  Likely has viral gastroenteritis caused by recent exposure to sick contacts, subsequently developed nausea vomiting and aspirated some vomitus developing dehydration, aspiration pneumonia and mild acute hypoxic respiratory failure.   She is admitted to medical telemetry bed, blood cultures will be obtained, respiratory and GI pathogen panel will be obtained, since she is still having some abdominal pain x 2 days will obtain CT abdomen pelvis with contrast, soft diet, IV fluids for hydration along with bolus, empiric Unasyn and azithromycin, speech eval, supplemental oxygen  as needed.  Kindly follow results of respiratory, GI pathogen panel, CT abdomen pelvis and blood cultures.   2.  Dehydration with hyponatremia and hypotension.  Multiple episodes of nausea vomiting, IV fluid bolus and maintenance, hold blood pressure medications.  Obtain serum osmolality, urine sodium and osmolality.  3.  Hypertension.  Blood pressure soft.  Hold  blood pressure medications.  4.  Dyslipidemia.  Continue home statin.  5.  Mild sinus tachycardia on EKG with subtle inferior wave abnormality likely due to rate related stress and hypoperfusion from hypotension.  No chest pain, monitor on telemetry, 2 sets of troponin, add aspirin and continue home statin, check echocardiogram to evaluate EF and wall motion.  6.  History of right BKA due to avascular necrosis.  Supportive care.  Wears prosthesis in right lower extremity.  7.  Gradually progressive Alzheimer's dementia.  At risk for delirium, minimize narcotics and benzodiazepines, as needed Haldol.    DVT Prophylaxis Heparin   AM Labs Ordered, also please review Full Orders  Family Communication: Admission, patients condition and plan of care including tests being ordered have been discussed with the patient and  daughter Ashok Cordia - [phone]  who indicate understanding and agree with the plan and Code Status.  Code Status Full  Likely DC to  TBD  Condition Fair  Consults called: None    Admission status:  inpt    Time spent in minutes : 40  Signature  -    Susa Raring M.D on 08/30/2023 at 5:46 PM   -  To page go to [email or web address]

## 2023-08-30 NOTE — ED Triage Notes (Signed)
 Patient arrives to the ED via [facility] from home with complaints generalized weakness, cough, and trouble urinating.   100.67F temp  O2 in the 80's at home, placed on 2L of oxygen en route. BP 118/58 HR 102

## 2023-08-30 NOTE — ED Provider Notes (Signed)
 [place] EMERGENCY DEPARTMENT AT [facility] Provider Note   CSN: [id number] Arrival date & time: 08/30/23  1024     History  Chief Complaint  Patient presents with   Fatigue   Urinary Frequency   Cough    Shelly [name] is a 73 y.o. female.  Pt is  73 yo female with pmh of HTN, hyperlipidemia, anxiety, asthma, stress incontinence, and memory difficulties presenting for generalized weakness, sob, coughing, and fall. Pt admits to generalized weakness after one night of nausea/vomiting over the last week with two mechanical falls in the last two days without head trauma. She admits to sob, nasal congestion, and cough x 2-3. Found be hypoxic on EMS arrival sating at in the 80s on room air. Pt does not wear home oxygen.   The history is provided by the patient. No language interpreter was used.  Urinary Frequency Associated symptoms include shortness of breath. Pertinent negatives include no chest pain and no abdominal pain.  Cough Associated symptoms: shortness of breath   Associated symptoms: no chest pain, no chills, no ear pain, no fever, no rash and no sore throat        Home Medications Prior to Admission medications   Medication Sig Start Date End Date Taking? Authorizing Provider  ondansetron (ZOFRAN-ODT) 8 MG disintegrating tablet Take 8 mg by mouth See admin instructions. *q6-8 hours for nausea and vomiting* 06/23/23  Yes [provider]  acidophilus (RISAQUAD) CAPS capsule Take 1 capsule by mouth daily.    [provider]  albuterol (PROAIR HFA) 108 (90 Base) MCG/ACT inhaler 2 puffs every 4 hours as needed only  if your can't catch your breath 05/03/23   Nyoka Cowden, MD  B Complex-Folic Acid (B COMPLEX PLUS PO) 1 tablet    [provider]  benzonatate (TESSALON) 200 MG capsule TAKE 1 CAPSULE(200 MG) BY MOUTH THREE TIMES DAILY AS NEEDED FOR COUGH 08/25/23   Nyoka Cowden, MD  Biotin 5000 MCG TABS Take 10,000 mcg by mouth every  evening.    [provider]  buPROPion (WELLBUTRIN XL) 300 MG 24 hr tablet Take 300 mg by mouth daily.    [provider]  Calcium Carbonate (CALCIUM 600 PO) Take by mouth daily.    [provider]  Cholecalciferol (VITAMIN D-3) 125 MCG (5000 UT) TABS Take by mouth daily.    [provider]  Cyanocobalamin (B-12 PO) Take 5,000 mcg by mouth daily.     [provider]  denosumab (PROLIA) 60 MG/ML SOLN injection Inject 60 mg into the skin every 6 (six) months.     [provider]  donepezil (ARICEPT) 10 MG tablet Take 1 tablet (10 mg total) by mouth at bedtime. 08/24/23   Glean Salvo, NP  FLUoxetine (PROZAC) 20 MG capsule Take 20 mg by mouth daily.    [provider]  losartan (COZAAR) 50 MG tablet Take 1 tablet (50 mg total) by mouth daily. 08/08/23   Sharlene Dory, PA-C  MAGNESIUM GLYCINATE PO Take by mouth.    [provider]  methocarbamol (ROBAXIN) 500 MG tablet Take 500 mg by mouth every 8 (eight) hours as needed. 06/10/19   [provider]  montelukast (SINGULAIR) 10 MG tablet TAKE 1 TABLET(10 MG) BY MOUTH DAILY 04/10/23   Nyoka Cowden, MD  Multiple Vitamins-Minerals (CENTRUM SILVER PO) Take 1 tablet by mouth daily.    [provider]  pantoprazole (PROTONIX) 40 MG tablet TAKE 30 TO 60  MINUTES BEFORE THE FIRST AND LAST MEAL OF THE DAY 07/24/23   Nyoka Cowden, MD  Potassium 99 MG TABS Take by mouth.    [provider]  rosuvastatin (CRESTOR) 10 MG tablet Take 1 tablet (10 mg total) by mouth every evening. 08/08/23   Sharlene Dory, PA-C  Theanine 50 MG TBDP Take by mouth.    [provider]  traMADol (ULTRAM) 50 MG tablet Take 50 mg by mouth 3 (three) times daily as needed. 09/03/20   [provider]  triamcinolone cream (KENALOG) 0.1 % Apply 1 Application topically 2 (two) times daily. 04/27/23   [provider]  Turmeric (QC TUMERIC COMPLEX PO) Take 1,400 mg by mouth  daily.    [provider]  valACYclovir (VALTREX) 1000 MG tablet Take 1,000 mg by mouth daily as needed.  06/09/09   [provider]      Allergies    Dextromethorphan    Review of Systems   Review of Systems  Constitutional:  Negative for chills and fever.  HENT:  Negative for ear pain and sore throat.   Eyes:  Negative for pain and visual disturbance.  Respiratory:  Positive for cough and shortness of breath.   Cardiovascular:  Negative for chest pain and palpitations.  Gastrointestinal:  Negative for abdominal pain and vomiting.  Genitourinary:  Positive for frequency. Negative for dysuria and hematuria.  Musculoskeletal:  Negative for arthralgias and back pain.  Skin:  Negative for color change and rash.  Neurological:  Positive for weakness. Negative for seizures and syncope.  All other systems reviewed and are negative.   Physical Exam Updated Vital Signs BP (!) 111/53   Pulse 98   Temp 99.7 F (37.6 C) (Oral)   Resp (!) 29   Ht 5\' 5"  (1.651 m)   Wt 74.9 kg   LMP  (LMP Unknown)   SpO2 96%   BMI 27.48 kg/m  Physical Exam Vitals and nursing note reviewed.  Constitutional:      General: She is not in acute distress.    Appearance: She is well-developed.  HENT:     Head: Normocephalic and atraumatic.  Eyes:     Conjunctiva/sclera: Conjunctivae normal.  Cardiovascular:     Rate and Rhythm: Regular rhythm. Tachycardia present.     Heart sounds: No murmur heard. Pulmonary:     Effort: Tachypnea and respiratory distress present.     Breath sounds: Rales present.  Abdominal:     Palpations: Abdomen is soft.     Tenderness: There is no abdominal tenderness.  Musculoskeletal:        General: No swelling.     Cervical back: Neck supple.  Skin:    General: Skin is warm and dry.     Capillary Refill: Capillary refill takes less than 2 seconds.  Neurological:     Mental Status: She is alert.  Psychiatric:        Mood and Affect: Mood normal.      ED Results / Procedures / Treatments   Labs (all labs ordered are listed, but only abnormal results are displayed) Labs Reviewed  COMPREHENSIVE METABOLIC PANEL - Abnormal; Notable for the following components:      Result Value   Sodium 131 (*)    Chloride 95 (*)    Glucose, Bld 106 (*)    Calcium 8.5 (*)    AST 58 (*)    All other components within normal limits  CBC WITH DIFFERENTIAL/PLATELET - Abnormal; Notable  for the following components:   RBC 3.61 (*)    Hemoglobin 11.4 (*)    HCT 34.7 (*)    Lymphs Abs 0.5 (*)    All other components within normal limits  RESP PANEL BY RT-PCR (RSV, FLU A&B, COVID)  RVPGX2  LACTIC ACID, PLASMA  URINALYSIS, W/ REFLEX TO CULTURE (INFECTION SUSPECTED)    EKG None  Radiology DG Chest 2 View Result Date: 08/30/2023 CLINICAL DATA:  Shortness of breath. EXAM: CHEST - 2 VIEW COMPARISON:  Chest radiograph dated 06/25/2023 FINDINGS: Patchy areas of airspace opacity in the right lower lobe and lingula consistent with pneumonia. Clinical correlation and follow-up to resolution recommended. Similar appearance of a 5 mm nodule in the left lower lung field, likely a granuloma. Attention on follow-up imaging recommended. No large pleural effusion. No pneumothorax. Stable cardiac silhouette. No acute osseous pathology. Thoracic spine stimulator. IMPRESSION: Right lower lobe and lingular pneumonia. Electronically Signed   By: Elgie Collard M.D.   On: 08/30/2023 11:25    Procedures .Critical Care  Performed by: Franne Forts, DO Authorized by: Franne Forts, DO   Critical care provider statement:    Critical care time (minutes):  75   Critical care was necessary to treat or prevent imminent or life-threatening deterioration of the following conditions:  Respiratory failure   Critical care was time spent personally by me on the following activities:  Development of treatment plan with patient or surrogate, discussions with consultants,  evaluation of patient's response to treatment, examination of patient, ordering and review of laboratory studies, ordering and review of radiographic studies, ordering and performing treatments and interventions, pulse oximetry, re-evaluation of patient's condition and review of old charts   Care discussed with: admitting provider       Medications Ordered in ED Medications  cefTRIAXone (ROCEPHIN) 1 g in sodium chloride 0.9 % 100 mL IVPB (has no administration in time range)  doxycycline (VIBRAMYCIN) 100 mg in sodium chloride 0.9 % 250 mL IVPB (has no administration in time range)  0.9 %  sodium chloride infusion ( Intravenous New Bag/Given 08/30/23 1237)    ED Course/ Medical Decision Making/ A&P                                 Medical Decision Making Amount and/or Complexity of Data Reviewed Labs: ordered. Radiology: ordered.  Risk Prescription drug management. Decision regarding hospitalization.   73 yo female with pmh of HTN, hyperlipidemia, anxiety, asthma, stress incontinence, and memory difficulties presenting for generalized weakness, sob, coughing, and fall.   Patient sounds like she had a viral illness with nausea, vomiting, generalized weakness, nasal congestion, and cough.  After a night of vomiting she had generalized weakness and fell without head trauma.  She has a continued lingering cough.  Nausea and vomiting has improved.  Her laboratory studies are stable with no elevated leukocytosis.  No signs or symptoms of sepsis.  Her electrolytes are stable.  Her creatinine and GFR are stable.  Maintenance fluids started.  Her chest x-ray demonstrated right upper lobe pneumonia.  Her COVID, flu, RSV PCR came back negative.  Antibiotics started.  When EMS found her in the home she was satting at 80% on room air.  She does not use home oxygen.  She arrived to the emergency department on 2 L nasal cannula.  When they were transitioning her between beds and getting her up to the  commode  she dropped to 87% while on 2 L.  Otherwise her abdomen is soft and nontender.  No abdominal imaging ordered at this time.  She has no bony tenderness from her fall.  She is neurovascularly intact.  At this time I am recommending admission for acute respiratory failure with hypoxia secondary to pneumonia.  Patient and daughter over the phone are agreeable to plan.  I spoke with Meding physician Dr. Erenest Blank who agrees to accept patient.        Final Clinical Impression(s) / ED Diagnoses Final diagnoses:  Acute respiratory failure with hypoxia (HCC)  Pneumonia of right upper lobe due to infectious organism    Rx / DC Orders ED Discharge Orders     None         Franne Forts, DO 08/30/23 1239

## 2023-08-30 NOTE — ED Notes (Signed)
 Called Thomas at [facility] for hosp transport 14:46

## 2023-08-30 NOTE — ED Notes (Signed)
 Pt reports fall x2 weeks ago where she fell on R side and hit head. Pt c/o ongoing headache since and R arm pain. Pt requesting something for pain. EDP notified.

## 2023-08-30 NOTE — Plan of Care (Signed)

## 2023-08-31 ENCOUNTER — Encounter: Payer: Self-pay | Admitting: Neurology

## 2023-08-31 ENCOUNTER — Inpatient Hospital Stay (HOSPITAL_COMMUNITY)

## 2023-08-31 ENCOUNTER — Encounter: Payer: Medicare Other | Admitting: Vascular Surgery

## 2023-08-31 ENCOUNTER — Telehealth: Payer: Self-pay | Admitting: Neurology

## 2023-08-31 DIAGNOSIS — J189 Pneumonia, unspecified organism: Secondary | ICD-10-CM | POA: Diagnosis not present

## 2023-08-31 DIAGNOSIS — R079 Chest pain, unspecified: Secondary | ICD-10-CM

## 2023-08-31 LAB — URINALYSIS, ROUTINE W REFLEX MICROSCOPIC
Bacteria, UA: NONE SEEN
Bilirubin Urine: NEGATIVE
Glucose, UA: NEGATIVE mg/dL
Hgb urine dipstick: NEGATIVE
Ketones, ur: 5 mg/dL — AB
Leukocytes,Ua: NEGATIVE
Nitrite: NEGATIVE
Protein, ur: 30 mg/dL — AB
Specific Gravity, Urine: >1.046 — ABNORMAL HIGH (ref 1.005–1.030)
pH: 5 (ref 5.0–8.0)

## 2023-08-31 LAB — STREP PNEUMONIAE URINARY ANTIGEN: Strep Pneumo Urinary Antigen: POSITIVE — AB

## 2023-08-31 LAB — BRAIN NATRIURETIC PEPTIDE: B Natriuretic Peptide: 172 pg/mL — ABNORMAL HIGH (ref 0.0–100.0)

## 2023-08-31 LAB — ECHOCARDIOGRAM COMPLETE
AR max vel: 1.74 cm2
AV Area VTI: 1.96 cm2
AV Area mean vel: 1.89 cm2
AV Mean grad: 5 mmHg
AV Peak grad: 9.5 mmHg
Ao pk vel: 1.54 m/s
Area-P 1/2: 2.91 cm2
Height: 65 in
S' Lateral: 1.9 cm
Weight: 2641.99 [oz_av]

## 2023-08-31 LAB — BASIC METABOLIC PANEL WITH GFR
Anion gap: 11 (ref 5–15)
BUN: 16 mg/dL (ref 8–23)
CO2: 22 mmol/L (ref 22–32)
Calcium: 7.8 mg/dL — ABNORMAL LOW (ref 8.9–10.3)
Chloride: 104 mmol/L (ref 98–111)
Creatinine, Ser: 0.84 mg/dL (ref 0.44–1.00)
GFR, Estimated: >60 mL/min (ref >60)
Glucose, Bld: 91 mg/dL (ref 70–99)
Potassium: 4.2 mmol/L (ref 3.5–5.1)
Sodium: 137 mmol/L (ref 135–145)

## 2023-08-31 LAB — CBC WITH DIFFERENTIAL/PLATELET
Abs Immature Granulocytes: 0.01 10*3/uL (ref 0.00–0.07)
Basophils Absolute: 0.1 10*3/uL (ref 0.0–0.1)
Basophils Relative: 1 %
Eosinophils Absolute: 0 10*3/uL (ref 0.0–0.5)
Eosinophils Relative: 0 %
HCT: 32.1 % — ABNORMAL LOW (ref 36.0–46.0)
Hemoglobin: 10.4 g/dL — ABNORMAL LOW (ref 12.0–15.0)
Immature Granulocytes: 0 %
Lymphocytes Relative: 11 %
Lymphs Abs: 0.8 10*3/uL (ref 0.7–4.0)
MCH: 31.7 pg (ref 26.0–34.0)
MCHC: 32.4 g/dL (ref 30.0–36.0)
MCV: 97.9 fL (ref 80.0–100.0)
Monocytes Absolute: 0.5 10*3/uL (ref 0.1–1.0)
Monocytes Relative: 7 %
Neutro Abs: 6 10*3/uL (ref 1.7–7.7)
Neutrophils Relative %: 81 %
Platelets: 179 10*3/uL (ref 150–400)
RBC: 3.28 MIL/uL — ABNORMAL LOW (ref 3.87–5.11)
RDW: 13.2 % (ref 11.5–15.5)
Smear Review: NORMAL
WBC: 7.4 10*3/uL (ref 4.0–10.5)
nRBC: 0.3 % — ABNORMAL HIGH (ref 0.0–0.2)

## 2023-08-31 LAB — OSMOLALITY, URINE: Osmolality, Ur: 587 mosm/kg (ref 300–900)

## 2023-08-31 LAB — SODIUM, URINE, RANDOM: Sodium, Ur: 16 mmol/L

## 2023-08-31 LAB — C-REACTIVE PROTEIN: CRP: 38 mg/dL — ABNORMAL HIGH (ref <1.0)

## 2023-08-31 LAB — MAGNESIUM: Magnesium: 2.3 mg/dL (ref 1.7–2.4)

## 2023-08-31 LAB — CREATININE, URINE, RANDOM: Creatinine, Urine: 80 mg/dL

## 2023-08-31 LAB — PROCALCITONIN: Procalcitonin: 6.56 ng/mL

## 2023-08-31 MED ORDER — POLYETHYLENE GLYCOL 3350 17 G PO PACK
17.0000 g | PACK | Freq: Every day | ORAL | Status: DC
  Administered 2023-08-31 – 2023-09-06 (×2): 17 g via ORAL
  Filled 2023-08-31 (×6): qty 1

## 2023-08-31 MED ORDER — SENNOSIDES-DOCUSATE SODIUM 8.6-50 MG PO TABS
1.0000 | ORAL_TABLET | Freq: Two times a day (BID) | ORAL | Status: DC
  Administered 2023-08-31 – 2023-09-06 (×7): 1 via ORAL
  Filled 2023-08-31 (×13): qty 1

## 2023-08-31 MED ORDER — SODIUM CHLORIDE 0.9 % IV SOLN: INTRAVENOUS | Status: DC

## 2023-08-31 NOTE — Evaluation (Signed)
 Physical Therapy Evaluation Patient Details Name: Shelly [name] MRN: [id number] DOB: 11-Feb-1951 Today's Date: 08/31/2023  History of Present Illness  73 y.o. female admitted 3/26 with N/V, weakness, falls, PNA. PMhx: Rt BKA, HTN, HLD, mild early Alzheimer's dementia, asthma, fibromyalgia, anxiety  Clinical Impression  Pt pleasant and reports plans to move to [facility] but currently lives in a town house alone and cares for herself. Pt needing assist to don shoes EOB due to lack of shoe horn but otherwise moving well with decreased Rt foot clearance and reliance on supplemental O2. Pt encouraged to be OOB during day and continue walking with staff. Will follow acutely to maximize pulmonary function and independence prior to return home.   SPO2 drop to 83% on RA at rest, 3L during gait to maintain 90%      If plan is discharge home, recommend the following: Assistance with cooking/housework   Can travel by private vehicle        Equipment Recommendations None recommended by PT  Recommendations for Other Services       Functional Status Assessment Patient has had a recent decline in their functional status and demonstrates the ability to make significant improvements in function in a reasonable and predictable amount of time.     Precautions / Restrictions Precautions Precautions: Other (comment) Recall of Precautions/Restrictions: Intact Precaution/Restrictions Comments: watch sats, prosthesis      Mobility  Bed Mobility Overal bed mobility: Modified Independent                  Transfers Overall transfer level: Modified independent                      Ambulation/Gait Ambulation/Gait assistance: Supervision Gait Distance (Feet): 400 Feet Assistive device: None Gait Pattern/deviations: Step-through pattern, Decreased stride length   Gait velocity interpretation: <1.8 ft/sec, indicate of risk for recurrent falls   General Gait Details: pt  with decreased stride and ease of stepping with increased step impact on Rt compared to baseline. assist for RUE support x 2 during standing rest to recover SPO2 requiring 3 L during gait to maintain >90%  Stairs            Wheelchair Mobility     Tilt Bed    Modified Rankin (Stroke Patients Only)       Balance Overall balance assessment: Needs assistance Sitting-balance support: No upper extremity supported Sitting balance-Leahy Scale: Good     Standing balance support: No upper extremity supported Standing balance-Leahy Scale: Good                               Pertinent Vitals/Pain Pain Assessment Pain Assessment: 0-10 Pain Score: 3  Pain Location: chest pain with deep breathing Pain Descriptors / Indicators: Aching Pain Intervention(s): Limited activity within patient's tolerance, Monitored during session, Repositioned    Home Living Family/patient expects to be discharged to:: Private residence Living Arrangements: Alone Available Help at Discharge: Family;Available PRN/intermittently Type of Home: House Home Access: Stairs to enter   Entrance Stairs-Number of Steps: 2 Alternate Level Stairs-Number of Steps: chair lift Home Layout: Two level;Bed/bath upstairs Home Equipment: Rolling Walker (2 wheels);Wheelchair - manual;Toilet riser;Cane - single point;Hand held shower head;Grab bars - tub/shower;Grab bars - toilet;Tub bench;Other (comment) (knee walker)      Prior Function Prior Level of Function : Independent/Modified Independent;Driving  Extremity/Trunk Assessment   Upper Extremity Assessment Upper Extremity Assessment: Overall WFL for tasks assessed    Lower Extremity Assessment Lower Extremity Assessment: RLE deficits/detail RLE Deficits / Details: Rt BKA with prosthesis, WFL    Cervical / Trunk Assessment Cervical / Trunk Assessment: Normal  Communication   Communication Communication: No apparent  difficulties    Cognition Arousal: Alert Behavior During Therapy: WFL for tasks assessed/performed   PT - Cognitive impairments: No apparent impairments                         Following commands: Intact       Cueing Cueing Techniques: Verbal cues     General Comments      Exercises     Assessment/Plan    PT Assessment Patient needs continued PT services  PT Problem List Decreased activity tolerance;Decreased mobility;Cardiopulmonary status limiting activity       PT Treatment Interventions Gait training;Functional mobility training;Therapeutic activities;Patient/family education    PT Goals (Current goals can be found in the Care Plan section)  Acute Rehab PT Goals Patient Stated Goal: return home PT Goal Formulation: With patient Time For Goal Achievement: 09/14/23 Potential to Achieve Goals: Good    Frequency Min 1X/week     Co-evaluation               AM-PAC PT "6 Clicks" Mobility  Outcome Measure Help needed turning from your back to your side while in a flat bed without using bedrails?: None Help needed moving from lying on your back to sitting on the side of a flat bed without using bedrails?: None Help needed moving to and from a bed to a chair (including a wheelchair)?: None Help needed standing up from a chair using your arms (e.g., wheelchair or bedside chair)?: None Help needed to walk in hospital room?: A Little Help needed climbing 3-5 steps with a railing? : A Little 6 Click Score: 22    End of Session   Activity Tolerance: Patient tolerated treatment well Patient left: Other (comment) (in bathroom with call bell) Nurse Communication: Mobility status PT Visit Diagnosis: Other abnormalities of gait and mobility (R26.89)    Time: 1610-9604 PT Time Calculation (min) (ACUTE ONLY): 31 min   Charges:   PT Evaluation $PT Eval Moderate Complexity: 1 Mod PT Treatments $Gait Training: 8-22 mins PT General Charges $$ ACUTE PT  VISIT: 1 Visit         Merryl Hacker, PT Acute Rehabilitation Services Office: [phone]   Shellie Goettl B Monique Gift 08/31/2023, 11:30 AM

## 2023-08-31 NOTE — Evaluation (Signed)
 Clinical/Bedside Swallow Evaluation Patient Details  Name: Shelly [name] MRN: [id number] Date of Birth: 26-Nov-1950  Today's Date: 08/31/2023 Time: SLP Start Time (ACUTE ONLY): 1126 SLP Stop Time (ACUTE ONLY): 1141 SLP Time Calculation (min) (ACUTE ONLY): 15 min  Past Medical History:  Past Medical History:  Diagnosis Date   Anxiety    Arthritis    Asthma    Avascular necrosis of talus (HCC)    Cancer (HCC)    vulva pre cancer had surgery   Depression    Gait abnormality 03/20/2017   GERD (gastroesophageal reflux disease)    Hearing loss    "very minor"   History of pneumonia    Hyperlipidemia    Hypertension    Leg edema    left   Memory difficulty 03/20/2017   PONV (postoperative nausea and vomiting)    Stress incontinence    Past Surgical History:  Past Surgical History:  Procedure Laterality Date   ANKLE FUSION  2011   right multiple    ANKLE FUSION     rear ankle fusion   ANKLE SURGERY  2010   right cordicompression   AORTIC ARCH ANGIOGRAPHY N/A 12/11/2017   Procedure: AORTIC ARCH ANGIOGRAPHY;  Surgeon: [name], MD;  Location: [facility];  Service: Cardiovascular;  Laterality: N/A;   APPENDECTOMY  05/10/2016   BELOW KNEE LEG AMPUTATION     CATARACT EXTRACTION W/ INTRAOCULAR LENS IMPLANT Left    LAPAROSCOPIC APPENDECTOMY N/A 05/10/2016   Procedure: APPENDECTOMY LAPAROSCOPIC;  Surgeon: [name], MD;  Location: [facility] OR;  Service: General;  Laterality: N/A;   LUMBAR LAMINECTOMY/DECOMPRESSION MICRODISCECTOMY N/A 09/09/2015   Procedure:  L4-S1 Decompression/ Discetomy;  Surgeon: [name], MD;  Location: [facility];  Service: Orthopedics;  Laterality: N/A;   SKIN GRAFT     SPINAL CORD STIMULATOR BATTERY EXCHANGE  06/23/2023   SPINAL CORD STIMULATOR INSERTION N/A 01/10/2019   Procedure: LUMBAR SPINAL CORD STIMULATOR INSERTION;  Surgeon: [name], MD;  Location: [facility];  Service: Orthopedics;  Laterality: N/A;  2.5 hrs   TONSILLECTOMY      TUBAL LIGATION  1983   VULVECTOMY N/A 01/28/2015   Procedure: WIDE EXCISION VULVECTOMY;  Surgeon: [name], MD;  Location: [facility];  Service: Gynecology;  Laterality: N/A;   HPI:  Shelly [name] is a 73 yo female presenting to ED 3/26 with n/v and mild abdominal pain leading to weakness and subsequent falls. CXR shows RLL and lingular PNA.  Pt previously seen by OP SLP for cognition and aphasia. PMH includes RLE avascular necrosis requiring R BKA 2017, HTN, dyslipidemia, mild early Alzheimer's dementia, asthma    Assessment / Plan / Recommendation  Clinical Impression  Pt denies any previous concerns with swallowing. She took consecutive straw sips of thin liquids without signs clinically concerning for aspiration. She initiates swift and complete oral clearance with purees and solids. Education was provided regarding general aspiration precautions. Recommend she continue a regular diet with thin liquids without further SLP f/u. Will sign off at this time. SLP Visit Diagnosis: Dysphagia, unspecified (R13.10)    Aspiration Risk  No limitations    Diet Recommendation Regular;Thin liquid    Liquid Administration via: Cup;Straw Medication Administration: Whole meds with liquid Supervision: Patient able to self feed Compensations: Slow rate;Small sips/bites Postural Changes: Seated upright at 90 degrees    Other  Recommendations Oral Care Recommendations: Oral care BID    Recommendations for follow up therapy are one component of a multi-disciplinary discharge planning  process, led by the attending physician.  Recommendations may be updated based on patient status, additional functional criteria and insurance authorization.  Follow up Recommendations No SLP follow up      Assistance Recommended at Discharge    Functional Status Assessment Patient has not had a recent decline in their functional status  Frequency and Duration            Prognosis Prognosis for improved  oropharyngeal function: Good      Swallow Study   General HPI: Shelly [name] is a 73 yo female presenting to ED 3/26 with n/v and mild abdominal pain leading to weakness and subsequent falls. CXR shows RLL and lingular PNA.  Pt previously seen by OP SLP for cognition and aphasia. PMH includes RLE avascular necrosis requiring R BKA 2017, HTN, dyslipidemia, mild early Alzheimer's dementia, asthma Type of Study: Bedside Swallow Evaluation Previous Swallow Assessment: none in chart Diet Prior to this Study: Regular;Thin liquids (Level 0) Temperature Spikes Noted: No Respiratory Status: Nasal cannula History of Recent Intubation: No Behavior/Cognition: Alert;Cooperative;Pleasant mood Oral Cavity Assessment: Within Functional Limits Oral Care Completed by SLP: No Oral Cavity - Dentition: Adequate natural dentition Vision: Functional for self-feeding Self-Feeding Abilities: Able to feed self Patient Positioning: Upright in bed Baseline Vocal Quality: Normal Volitional Cough: Strong Volitional Swallow: Able to elicit    Oral/Motor/Sensory Function Overall Oral Motor/Sensory Function: Within functional limits   Ice Chips Ice chips: Not tested   Thin Liquid Thin Liquid: Within functional limits Presentation: Straw;Self Fed    Nectar Thick Nectar Thick Liquid: Not tested   Honey Thick Honey Thick Liquid: Not tested   Puree Puree: Within functional limits Presentation: Spoon   Solid     Solid: Within functional limits Presentation: Self Fed      Gwynneth Aliment, M.A., CF-SLP Speech Language Pathology, Acute Rehabilitation Services  Secure Chat preferred [phone]  08/31/2023,12:15 PM

## 2023-08-31 NOTE — Telephone Encounter (Signed)
 I spoke with the patient.  She is currently admitted for pneumonia.  The results from the blood testing are consistent with an Alzheimer's related pathology. Next steps, may consider amyloid PET scan to confirm amyloid in the brain. We should do an MRI of the brain at some point, will need to confirm if your spinal stimulator is MRI compatible. Please review Leqembi and Blanchie Dessert which are newer IV medications for AD. Once you are doing well on Aricept, let's add Namenda.   E3/E4 one copy of APOE4 variant

## 2023-08-31 NOTE — Progress Notes (Signed)
 SATURATION QUALIFICATIONS: (This note is used to comply with regulatory documentation for home oxygen)  Patient Saturations on Room Air at Rest = 83%  Pt saturations on 1L at rest= 94%  Patient Saturations on 3L while Ambulating = 90%  Please briefly explain why patient needs home oxygen: Pt currently requires supplemental O2 at all times to maintain SPO2 >90%  Shelly [name] P, PT Acute Rehabilitation Services Office: [phone]

## 2023-08-31 NOTE — Progress Notes (Signed)
 PROGRESS NOTE  Shelly [name]  WUJ:[id number] DOB: 27-Oct-1950 DOA: 08/30/2023 PCP: [name], MD   Brief Narrative: Patient is a 73 year old female with history of right lower extremity avascular necrosis status post right BKA, hypertension, hyperlipidemia, Alzheimer's dementia, asthma who lives alone presented to the emergency department with complaint of fatigue, urinary frequency, cough, nausea, vomiting, abdominal pain, weakness.  On presentation, she was hypoxic, required 2 L of oxygen.  Lab work showed a sodium of 131, elevated CRP.  COVID/flu/RSV negative.  UA was not suspicious for UTI.  Chest x-ray showed bilateral pneumonia.  CT chest showed right lower lobe pneumonia.  Admitted for further management of community-acquired pneumonia/aspiration pneumonia.  Speech therapy consulted  Assessment & Plan:  Principal Problem:   CAP (community acquired pneumonia)  Community-acquired pneumonia/aspiration pneumonia :presented with cough.  Initially hypoxic requiring 2 L of oxygen. Chest x-ray showed right lower lobe and lingular pneumonia.  CT abdomen/pelvis showed right lower lobe pneumonia.  Urine antigen positive for streptococcal pneumonia antigen. Suspected aspiration pneumonia.  Currently on Unasyn.  Speech therapy consulted. Not on oxygen at home.  Continue to wean. Procalcitonin elevated.  Follow-up blood cultures.  She is afebrile.  Abdominal pain, nausea, vomiting:CT abdomen/pelvis did not show any acute findings in abdomen/pelvis.  Started gentle IV fluids, supportive care, antiemetics.  GI pathogen panel pending.  No bowel movement since admission.  Abdomen is benign on examination today.  Has good bowel sounds.  No bowel movement for last 7 days.  Continue bowel regimen  Mild hyponatremia: Likely from dehydration.  Resolved with IV fluids  Hypertension: Currently blood pressure soft.  Home antihypertensives on hold.  Continue gentle IV fluids  Hyperlipidemia: On  statin  Alzheimer's dementia: Lives alone.  Continue delirium precaution, frequent reorientation.  Minimize narcotics, sedatives.  Currently alert and oriented  Debility/deconditioning: Presented with weakness.  Will consult PT.  Patient is status post right BKA        DVT prophylaxis:heparin injection 5,000 Units Start: 08/30/23 2200     Code Status: Full Code  Family Communication: Discussed with daughter on phone at bedside  Patient status:Inpatient  Patient is from :Home  Anticipated discharge NF:[facility] vs SNF  Estimated DC date:1-2 days   Consultants: None  Procedures: None  Antimicrobials:  Anti-infectives (From admission, onward)    Start     Dose/Rate Route Frequency Ordered Stop   08/30/23 1830  Ampicillin-Sulbactam (UNASYN) 3 g in sodium chloride 0.9 % 100 mL IVPB        3 g 200 mL/hr over 30 Minutes Intravenous Every 6 hours 08/30/23 1744 09/04/23 1829   08/30/23 1800  cefTRIAXone (ROCEPHIN) 1 g in sodium chloride 0.9 % 100 mL IVPB  Status:  Discontinued        1 g 200 mL/hr over 30 Minutes Intravenous Every 24 hours 08/30/23 1704 08/30/23 1742   08/30/23 1800  azithromycin (ZITHROMAX) 500 mg in sodium chloride 0.9 % 250 mL IVPB        500 mg 250 mL/hr over 60 Minutes Intravenous Every 24 hours 08/30/23 1704 09/04/23 1759   08/30/23 1800  doxycycline (VIBRAMYCIN) 100 mg in sodium chloride 0.9 % 250 mL IVPB  Status:  Discontinued        100 mg 125 mL/hr over 120 Minutes Intravenous Every 12 hours 08/30/23 1706 08/30/23 1743   08/30/23 1230  cefTRIAXone (ROCEPHIN) 1 g in sodium chloride 0.9 % 100 mL IVPB        1 g 200 mL/hr over  30 Minutes Intravenous  Once 08/30/23 1220 08/30/23 1329   08/30/23 1230  doxycycline (VIBRAMYCIN) 100 mg in sodium chloride 0.9 % 250 mL IVPB        100 mg 125 mL/hr over 120 Minutes Intravenous  Once 08/30/23 1220 08/30/23 1554       Subjective: Patient seen and examined at bedside today.  Hemodynamically stable.  Overall  comfortable.  Lying in bed.  Denies any worsening shortness of breath or cough.  Complains of weakness.  On 1 L of oxygen per minute.  Long discussion held with the daughter on phone from bedside about her management plan  Objective: Vitals:   08/30/23 1913 08/31/23 0129 08/31/23 0421 08/31/23 0736  BP: (!) 105/51 101/60 (!) 112/54 (!) 91/47  Pulse: 90 86 83 90  Resp: 18 16 18 18   Temp: 98.5 F (36.9 C) 98.4 F (36.9 C) 98.4 F (36.9 C) 98 F (36.7 C)  TempSrc: Oral Oral Oral   SpO2: 98% 96% 95% 97%  Weight:      Height:        Intake/Output Summary (Last 24 hours) at 08/31/2023 0801 Last data filed at 08/31/2023 0400 Gross per 24 hour  Intake 1545.8 ml  Output 600 ml  Net 945.8 ml   Filed Weights   08/30/23 1033  Weight: 74.9 kg    Examination:  General exam: Overall comfortable, not in distress, pleasant  female HEENT: PERRL Respiratory system: Crackles on the right base Cardiovascular system: S1 & S2 heard, RRR.  Gastrointestinal system: Abdomen is mildly distended, soft and nontender.  Bowel sounds present Central nervous system: Alert and oriented Extremities: No edema, no clubbing ,no cyanosis, right BKA Skin: No rashes, no ulcers,no icterus     Data Reviewed: I have personally reviewed following labs and imaging studies  CBC: Recent Labs  Lab 08/30/23 1037 08/30/23 1745  WBC 5.5 4.5  NEUTROABS 4.1  --   HGB 11.4* 10.1*  HCT 34.7* 30.3*  MCV 96.1 96.5  PLT 190 165   Basic Metabolic Panel: Recent Labs  Lab 08/30/23 1037 08/30/23 1745  NA 131*  --   K 4.4  --   CL 95*  --   CO2 26  --   GLUCOSE 106*  --   BUN 22  --   CREATININE 0.92 1.18*  CALCIUM 8.5*  --      Recent Results (from the past 240 hours)  Resp panel by RT-PCR (RSV, Flu A&B, Covid) Anterior Nasal Swab     Status: None   Collection Time: 08/30/23 10:37 AM   Specimen: Anterior Nasal Swab  Result Value Ref Range Status   SARS Coronavirus 2 by RT PCR NEGATIVE NEGATIVE Final     Comment: (NOTE) SARS-CoV-2 target nucleic acids are NOT DETECTED.  The SARS-CoV-2 RNA is generally detectable in upper respiratory specimens during the acute phase of infection. The lowest concentration of SARS-CoV-2 viral copies this assay can detect is 138 copies/mL. A negative result does not preclude SARS-Cov-2 infection and should not be used as the sole basis for treatment or other patient management decisions. A negative result may occur with  improper specimen collection/handling, submission of specimen other than nasopharyngeal swab, presence of viral mutation(s) within the areas targeted by this assay, and inadequate number of viral copies(<138 copies/mL). A negative result must be combined with clinical observations, patient history, and epidemiological information. The expected result is Negative.  Fact Sheet for Patients:  BloggerCourse.com  Fact Sheet for Healthcare Providers:  SeriousBroker.it  This test is no t yet approved or cleared by the [place] and  has been authorized for detection and/or diagnosis of SARS-CoV-2 by FDA under an Emergency Use Authorization (EUA). This EUA will remain  in effect (meaning this test can be used) for the duration of the COVID-19 declaration under Section 564(b)(1) of the Act, 21 U.S.C.section 360bbb-3(b)(1), unless the authorization is terminated  or revoked sooner.       Influenza A by PCR NEGATIVE NEGATIVE Final   Influenza B by PCR NEGATIVE NEGATIVE Final    Comment: (NOTE) The Xpert Xpress SARS-CoV-2/FLU/RSV plus assay is intended as an aid in the diagnosis of influenza from Nasopharyngeal swab specimens and should not be used as a sole basis for treatment. Nasal washings and aspirates are unacceptable for Xpert Xpress SARS-CoV-2/FLU/RSV testing.  Fact Sheet for Patients: BloggerCourse.com  Fact Sheet for Healthcare  Providers: SeriousBroker.it  This test is not yet approved or cleared by the [place] FDA and has been authorized for detection and/or diagnosis of SARS-CoV-2 by FDA under an Emergency Use Authorization (EUA). This EUA will remain in effect (meaning this test can be used) for the duration of the COVID-19 declaration under Section 564(b)(1) of the Act, 21 U.S.C. section 360bbb-3(b)(1), unless the authorization is terminated or revoked.     Resp Syncytial Virus by PCR NEGATIVE NEGATIVE Final    Comment: (NOTE) Fact Sheet for Patients: BloggerCourse.com  Fact Sheet for Healthcare Providers: SeriousBroker.it  This test is not yet approved or cleared by the [place] FDA and has been authorized for detection and/or diagnosis of SARS-CoV-2 by FDA under an Emergency Use Authorization (EUA). This EUA will remain in effect (meaning this test can be used) for the duration of the COVID-19 declaration under Section 564(b)(1) of the Act, 21 U.S.C. section 360bbb-3(b)(1), unless the authorization is terminated or revoked.  Performed at [facility], [address], [place], [place] [address]      Radiology Studies: CT ABDOMEN PELVIS W CONTRAST Result Date: 08/30/2023 CLINICAL DATA:  Left lower quadrant pain EXAM: CT ABDOMEN AND PELVIS WITH CONTRAST TECHNIQUE: Multidetector CT imaging of the abdomen and pelvis was performed using the standard protocol following bolus administration of intravenous contrast. RADIATION DOSE REDUCTION: This exam was performed according to the departmental dose-optimization program which includes automated exposure control, adjustment of the mA and/or kV according to patient size and/or use of iterative reconstruction technique. CONTRAST:  75mL OMNIPAQUE IOHEXOL 350 MG/ML SOLN COMPARISON:  CT abdomen and pelvis 01/11/2019 FINDINGS: Lower chest: Right lower lobe  ground-glass and airspace opacities are present compatible with pneumonia. There are scattered interstitial opacities throughout both lung bases which are new from prior. There is a trace right pleural effusion. Hepatobiliary: No focal liver abnormality is seen. Calcified granulomas are present. No gallstones, gallbladder wall thickening, or biliary dilatation. Pancreas: Unremarkable. No pancreatic ductal dilatation or surrounding inflammatory changes. Spleen: Calcified granulomas are present.  Normal in size. Adrenals/Urinary Tract: Adrenal glands are unremarkable. Kidneys are normal, without renal calculi, focal lesion, or hydronephrosis. Bladder is unremarkable. Stomach/Bowel: Stomach is within normal limits. Appendix is surgically absent. No evidence of bowel wall thickening, distention, or inflammatory changes. There is sigmoid colon diverticulosis. Vascular/Lymphatic: Aortic atherosclerosis. No enlarged abdominal or pelvic lymph nodes. Reproductive: Uterus and bilateral adnexa are unremarkable. Other: No abdominal wall hernia or abnormality. No abdominopelvic ascites. Musculoskeletal: Thoracic spinal cord stimulator device present. Severe degenerative changes affect the spine. IMPRESSION: 1. No acute localizing process in the  abdomen or pelvis. 2. Right lower lobe pneumonia. 3. Trace right pleural effusion. 4. Sigmoid colon diverticulosis. 5. Aortic atherosclerosis. Aortic Atherosclerosis (ICD10-I70.0). Electronically Signed   By: Darliss Cheney M.D.   On: 08/30/2023 21:41   DG Chest 2 View Result Date: 08/30/2023 CLINICAL DATA:  Shortness of breath. EXAM: CHEST - 2 VIEW COMPARISON:  Chest radiograph dated 06/25/2023 FINDINGS: Patchy areas of airspace opacity in the right lower lobe and lingula consistent with pneumonia. Clinical correlation and follow-up to resolution recommended. Similar appearance of a 5 mm nodule in the left lower lung field, likely a granuloma. Attention on follow-up imaging  recommended. No large pleural effusion. No pneumothorax. Stable cardiac silhouette. No acute osseous pathology. Thoracic spine stimulator. IMPRESSION: Right lower lobe and lingular pneumonia. Electronically Signed   By: Elgie Collard M.D.   On: 08/30/2023 11:25    Scheduled Meds:  acidophilus  1 capsule Oral Daily   aspirin  81 mg Oral Daily   buPROPion  300 mg Oral Daily   cholecalciferol  2,000 Units Oral Daily   donepezil  10 mg Oral QHS   FLUoxetine  20 mg Oral Daily   heparin  5,000 Units Subcutaneous Q8H   montelukast  10 mg Oral QHS   multivitamin with minerals  1 tablet Oral Daily   pantoprazole  40 mg Oral Daily   rosuvastatin  10 mg Oral QPM   Continuous Infusions:  ampicillin-sulbactam (UNASYN) IV 3 g (08/31/23 0400)   azithromycin 500 mg (08/30/23 1902)   lactated ringers 100 mL/hr at 08/31/23 0327     LOS: 1 day   Burnadette Pop, MD [facility] P3/27/2025, 8:01 AM

## 2023-09-01 DIAGNOSIS — J189 Pneumonia, unspecified organism: Secondary | ICD-10-CM | POA: Diagnosis not present

## 2023-09-01 LAB — BASIC METABOLIC PANEL WITH GFR
Anion gap: 9 (ref 5–15)
BUN: 9 mg/dL (ref 8–23)
CO2: 25 mmol/L (ref 22–32)
Calcium: 7.6 mg/dL — ABNORMAL LOW (ref 8.9–10.3)
Chloride: 104 mmol/L (ref 98–111)
Creatinine, Ser: 0.57 mg/dL (ref 0.44–1.00)
GFR, Estimated: >60 mL/min (ref >60)
Glucose, Bld: 89 mg/dL (ref 70–99)
Potassium: 3.1 mmol/L — ABNORMAL LOW (ref 3.5–5.1)
Sodium: 138 mmol/L (ref 135–145)

## 2023-09-01 LAB — GASTROINTESTINAL PANEL BY PCR, STOOL (REPLACES STOOL CULTURE)

## 2023-09-01 LAB — RESPIRATORY PANEL BY PCR

## 2023-09-01 LAB — CBC
HCT: 31.6 % — ABNORMAL LOW (ref 36.0–46.0)
Hemoglobin: 10.2 g/dL — ABNORMAL LOW (ref 12.0–15.0)
MCH: 31.5 pg (ref 26.0–34.0)
MCHC: 32.3 g/dL (ref 30.0–36.0)
MCV: 97.5 fL (ref 80.0–100.0)
Platelets: 212 10*3/uL (ref 150–400)
RBC: 3.24 MIL/uL — ABNORMAL LOW (ref 3.87–5.11)
RDW: 13.1 % (ref 11.5–15.5)
WBC: 7.7 10*3/uL (ref 4.0–10.5)
nRBC: 0 % (ref 0.0–0.2)

## 2023-09-01 LAB — MRSA NEXT GEN BY PCR, NASAL: MRSA by PCR Next Gen: NOT DETECTED

## 2023-09-01 LAB — PROCALCITONIN: Procalcitonin: 3.32 ng/mL

## 2023-09-01 LAB — MAGNESIUM: Magnesium: 2.1 mg/dL (ref 1.7–2.4)

## 2023-09-01 MED ORDER — PROCHLORPERAZINE EDISYLATE 10 MG/2ML IJ SOLN
5.0000 mg | Freq: Once | INTRAMUSCULAR | Status: AC
  Administered 2023-09-01: 5 mg via INTRAVENOUS
  Filled 2023-09-01: qty 2

## 2023-09-01 MED ORDER — POTASSIUM CHLORIDE CRYS ER 20 MEQ PO TBCR
40.0000 meq | EXTENDED_RELEASE_TABLET | Freq: Once | ORAL | Status: AC
  Administered 2023-09-01: 40 meq via ORAL
  Filled 2023-09-01: qty 2

## 2023-09-01 MED ORDER — KETOROLAC TROMETHAMINE 30 MG/ML IJ SOLN
30.0000 mg | Freq: Once | INTRAMUSCULAR | Status: AC
  Administered 2023-09-01: 30 mg via INTRAVENOUS
  Filled 2023-09-01: qty 1

## 2023-09-01 MED ORDER — DIPHENHYDRAMINE HCL 50 MG/ML IJ SOLN
25.0000 mg | Freq: Once | INTRAMUSCULAR | Status: AC
  Administered 2023-09-01: 25 mg via INTRAVENOUS
  Filled 2023-09-01: qty 1

## 2023-09-01 NOTE — Progress Notes (Signed)
 PROGRESS NOTE  Shelly [name]  GNF:[id number] DOB: 1950/11/25 DOA: 08/30/2023 PCP: [name], MD   Brief Narrative: Patient is a 73 year old female with history of right lower extremity avascular necrosis status post right BKA, hypertension, hyperlipidemia, Alzheimer's dementia, asthma who lives alone presented to the emergency department with complaint of fatigue, urinary frequency, cough, nausea, vomiting, abdominal pain, weakness.  On presentation, she was hypoxic, required 2 L of oxygen.  Lab work showed a sodium of 131, elevated CRP.  COVID/flu/RSV negative.  UA was not suspicious for UTI.  Chest x-ray showed bilateral pneumonia.  CT chest showed right lower lobe pneumonia.  Admitted for further management of community-acquired pneumonia/aspiration pneumonia.  Speech therapy consulted  3/28: Continue to require 2 L of oxygen with no baseline use.  Patient would like to go home without oxygen if possible so giving 1-2 more days.  Assessment & Plan:  Principal Problem:   CAP (community acquired pneumonia)  Community-acquired pneumonia/aspiration pneumonia  Acute hypoxic respiratory failure. presented with cough.  Initially hypoxic requiring 2 L of oxygen, still needing oxygen especially with ambulation with no baseline use. Chest x-ray showed right lower lobe and lingular pneumonia.  CT abdomen/pelvis showed right lower lobe pneumonia.  Urine antigen positive for streptococcal pneumonia antigen. Suspected aspiration pneumonia.  Currently on Unasyn.  Speech therapy consulted. Not on oxygen at home.  Continue to wean. Procalcitonin elevated but trending down.  Cultures remain negative -Continue with Unasyn  Abdominal pain, nausea, vomiting:CT abdomen/pelvis did not show any acute findings in abdomen/pelvis.  Started gentle IV fluids, supportive care, antiemetics.  GI pathogen panel negative.  Had a bowel movement. -Continue with bowel regimen  Mild hyponatremia: Likely from  dehydration.  Resolved with IV fluids  Hypertension: Currently blood pressure soft.  Home antihypertensives on hold.  Continue gentle IV fluids  Hyperlipidemia: On statin  Alzheimer's dementia: Lives alone.  Continue delirium precaution, frequent reorientation.  Minimize narcotics, sedatives.  Currently alert and oriented  Debility/deconditioning: Presented with weakness. Patient is status post right BKA PT with no follow-up recommendations  DVT prophylaxis:heparin injection 5,000 Units Start: 08/30/23 2200     Code Status: Full Code  Family Communication: Discussed with daughter on phone  Patient status:Inpatient  Patient is from :Home  Anticipated discharge QI:ONGE  Estimated DC date:1-2 days   Consultants: None  Procedures: None  Antimicrobials:  Anti-infectives (From admission, onward)    Start     Dose/Rate Route Frequency Ordered Stop   08/30/23 1830  Ampicillin-Sulbactam (UNASYN) 3 g in sodium chloride 0.9 % 100 mL IVPB        3 g 200 mL/hr over 30 Minutes Intravenous Every 6 hours 08/30/23 1744 09/04/23 1829   08/30/23 1800  cefTRIAXone (ROCEPHIN) 1 g in sodium chloride 0.9 % 100 mL IVPB  Status:  Discontinued        1 g 200 mL/hr over 30 Minutes Intravenous Every 24 hours 08/30/23 1704 08/30/23 1742   08/30/23 1800  azithromycin (ZITHROMAX) 500 mg in sodium chloride 0.9 % 250 mL IVPB  Status:  Discontinued        500 mg 250 mL/hr over 60 Minutes Intravenous Every 24 hours 08/30/23 1704 08/31/23 0951   08/30/23 1800  doxycycline (VIBRAMYCIN) 100 mg in sodium chloride 0.9 % 250 mL IVPB  Status:  Discontinued        100 mg 125 mL/hr over 120 Minutes Intravenous Every 12 hours 08/30/23 1706 08/30/23 1743   08/30/23 1230  cefTRIAXone (ROCEPHIN) 1 g in  sodium chloride 0.9 % 100 mL IVPB        1 g 200 mL/hr over 30 Minutes Intravenous  Once 08/30/23 1220 08/30/23 1329   08/30/23 1230  doxycycline (VIBRAMYCIN) 100 mg in sodium chloride 0.9 % 250 mL IVPB        100  mg 125 mL/hr over 120 Minutes Intravenous  Once 08/30/23 1220 08/30/23 1554       Subjective: Patient continued to have some cough and shortness of breath, continued to require oxygen-no baseline oxygen use and prefer to go home without oxygen.  Objective: Vitals:   08/31/23 2135 09/01/23 0427 09/01/23 0734 09/01/23 1216  BP: 118/71 131/60 (!) 141/69 136/60  Pulse: 100 91 87 83  Resp:  18 18 16   Temp: 98.7 F (37.1 C) 98.5 F (36.9 C) 97.9 F (36.6 C) 98.2 F (36.8 C)  TempSrc: Oral Oral Oral Oral  SpO2: 93% 93% 92% 91%  Weight:      Height:        Intake/Output Summary (Last 24 hours) at 09/01/2023 1220 Last data filed at 09/01/2023 0844 Gross per 24 hour  Intake 330.68 ml  Output --  Net 330.68 ml   Filed Weights   08/30/23 1033  Weight: 74.9 kg    Examination:  General.  Frail elderly lady, in no acute distress. Pulmonary.  Lungs clear bilaterally, normal respiratory effort. CV.  Regular rate and rhythm, no JVD, rub or murmur. Abdomen.  Soft, nontender, nondistended, BS positive. CNS.  Alert and oriented .  No focal neurologic deficit. Extremities.  No edema, right BKA with prosthesis Psychiatry.  Judgment and insight appears normal.    Data Reviewed: I have personally reviewed following labs and imaging studies  CBC: Recent Labs  Lab 08/30/23 1037 08/30/23 1745 08/31/23 0801 09/01/23 0812  WBC 5.5 4.5 7.4 7.7  NEUTROABS 4.1  --  6.0  --   HGB 11.4* 10.1* 10.4* 10.2*  HCT 34.7* 30.3* 32.1* 31.6*  MCV 96.1 96.5 97.9 97.5  PLT 190 165 179 212   Basic Metabolic Panel: Recent Labs  Lab 08/30/23 1037 08/30/23 1745 08/31/23 0801 09/01/23 0812  NA 131*  --  137 138  K 4.4  --  4.2 3.1*  CL 95*  --  104 104  CO2 26  --  22 25  GLUCOSE 106*  --  91 89  BUN 22  --  16 9  CREATININE 0.92 1.18* 0.84 0.57  CALCIUM 8.5*  --  7.8* 7.6*  MG  --   --  2.3 2.1     Recent Results (from the past 240 hours)  Resp panel by RT-PCR (RSV, Flu A&B, Covid)  Anterior Nasal Swab     Status: None   Collection Time: 08/30/23 10:37 AM   Specimen: Anterior Nasal Swab  Result Value Ref Range Status   SARS Coronavirus 2 by RT PCR NEGATIVE NEGATIVE Final    Comment: (NOTE) SARS-CoV-2 target nucleic acids are NOT DETECTED.  The SARS-CoV-2 RNA is generally detectable in upper respiratory specimens during the acute phase of infection. The lowest concentration of SARS-CoV-2 viral copies this assay can detect is 138 copies/mL. A negative result does not preclude SARS-Cov-2 infection and should not be used as the sole basis for treatment or other patient management decisions. A negative result may occur with  improper specimen collection/handling, submission of specimen other than nasopharyngeal swab, presence of viral mutation(s) within the areas targeted by this assay, and inadequate number of viral copies(<138  copies/mL). A negative result must be combined with clinical observations, patient history, and epidemiological information. The expected result is Negative.  Fact Sheet for Patients:  BloggerCourse.com  Fact Sheet for Healthcare Providers:  SeriousBroker.it  This test is no t yet approved or cleared by the [place] FDA and  has been authorized for detection and/or diagnosis of SARS-CoV-2 by FDA under an Emergency Use Authorization (EUA). This EUA will remain  in effect (meaning this test can be used) for the duration of the COVID-19 declaration under Section 564(b)(1) of the Act, 21 U.S.C.section 360bbb-3(b)(1), unless the authorization is terminated  or revoked sooner.       Influenza A by PCR NEGATIVE NEGATIVE Final   Influenza B by PCR NEGATIVE NEGATIVE Final    Comment: (NOTE) The Xpert Xpress SARS-CoV-2/FLU/RSV plus assay is intended as an aid in the diagnosis of influenza from Nasopharyngeal swab specimens and should not be used as a sole basis for treatment. Nasal washings  and aspirates are unacceptable for Xpert Xpress SARS-CoV-2/FLU/RSV testing.  Fact Sheet for Patients: BloggerCourse.com  Fact Sheet for Healthcare Providers: SeriousBroker.it  This test is not yet approved or cleared by the [place] FDA and has been authorized for detection and/or diagnosis of SARS-CoV-2 by FDA under an Emergency Use Authorization (EUA). This EUA will remain in effect (meaning this test can be used) for the duration of the COVID-19 declaration under Section 564(b)(1) of the Act, 21 U.S.C. section 360bbb-3(b)(1), unless the authorization is terminated or revoked.     Resp Syncytial Virus by PCR NEGATIVE NEGATIVE Final    Comment: (NOTE) Fact Sheet for Patients: BloggerCourse.com  Fact Sheet for Healthcare Providers: SeriousBroker.it  This test is not yet approved or cleared by the [place] FDA and has been authorized for detection and/or diagnosis of SARS-CoV-2 by FDA under an Emergency Use Authorization (EUA). This EUA will remain in effect (meaning this test can be used) for the duration of the COVID-19 declaration under Section 564(b)(1) of the Act, 21 U.S.C. section 360bbb-3(b)(1), unless the authorization is terminated or revoked.  Performed at [facility], [address] [place], [place] [address]   MRSA Next Gen by PCR, Nasal     Status: None   Collection Time: 08/31/23  4:35 AM   Specimen: Nasal Mucosa; Nasal Swab  Result Value Ref Range Status   MRSA by PCR Next Gen NOT DETECTED NOT DETECTED Final    Comment: (NOTE) The GeneXpert MRSA Assay (FDA approved for NASAL specimens only), is one component of a comprehensive MRSA colonization surveillance program. It is not intended to diagnose MRSA infection nor to guide or monitor treatment for MRSA infections. Test performance is not FDA approved in patients less than 34  years old. Performed at [facility], [address], [place], [place] [address]   Culture, blood (Routine X 2) w Reflex to ID Panel     Status: None (Preliminary result)   Collection Time: 08/31/23 12:58 PM   Specimen: BLOOD LEFT HAND  Result Value Ref Range Status   Specimen Description BLOOD LEFT HAND  Final   Special Requests   Final    BOTTLES DRAWN AEROBIC AND ANAEROBIC Blood Culture results may not be optimal due to an inadequate volume of blood received in culture bottles   Culture   Final    NO GROWTH < 24 HOURS Performed at [facility], [address] [place], [place] [address]    Report Status PENDING  Incomplete  Culture, blood (Routine X 2) w Reflex to ID Panel     Status: None (Preliminary result)   Collection Time: 08/31/23 12:58 PM   Specimen: BLOOD RIGHT HAND  Result Value Ref Range Status   Specimen Description BLOOD RIGHT HAND  Final   Special Requests   Final    BOTTLES DRAWN AEROBIC AND ANAEROBIC Blood Culture results may not be optimal due to an inadequate volume of blood received in culture bottles   Culture   Final    NO GROWTH < 24 HOURS Performed at [facility], [address] [place], [place] [address]    Report Status PENDING  Incomplete  Gastrointestinal Panel by PCR , Stool     Status: None   Collection Time: 09/01/23  2:41 AM   Specimen: Stool  Result Value Ref Range Status   Campylobacter species NOT DETECTED NOT DETECTED Final   Plesimonas shigelloides NOT DETECTED NOT DETECTED Final   Salmonella species NOT DETECTED NOT DETECTED Final   Yersinia enterocolitica NOT DETECTED NOT DETECTED Final   Vibrio species NOT DETECTED NOT DETECTED Final   Vibrio cholerae NOT DETECTED NOT DETECTED Final   Enteroaggregative E coli (EAEC) NOT DETECTED NOT DETECTED Final   Enteropathogenic E coli (EPEC) NOT DETECTED NOT DETECTED Final   Enterotoxigenic E coli (ETEC) NOT DETECTED NOT DETECTED Final   Shiga like toxin producing E coli  (STEC) NOT DETECTED NOT DETECTED Final   Shigella/Enteroinvasive E coli (EIEC) NOT DETECTED NOT DETECTED Final   Cryptosporidium NOT DETECTED NOT DETECTED Final   Cyclospora cayetanensis NOT DETECTED NOT DETECTED Final   Entamoeba histolytica NOT DETECTED NOT DETECTED Final   Giardia lamblia NOT DETECTED NOT DETECTED Final   Adenovirus F40/41 NOT DETECTED NOT DETECTED Final   Astrovirus NOT DETECTED NOT DETECTED Final   Norovirus GI/GII NOT DETECTED NOT DETECTED Final   Rotavirus A NOT DETECTED NOT DETECTED Final   Sapovirus (I, II, IV, and V) NOT DETECTED NOT DETECTED Final    Comment: Performed at [facility], [address] [place], [place] [address]  Respiratory (~20 pathogens) panel by PCR     Status: Abnormal   Collection Time: 09/01/23  2:51 AM   Specimen: Nasopharyngeal Swab; Respiratory  Result Value Ref Range Status   Adenovirus NOT DETECTED NOT DETECTED Final   Coronavirus 229E NOT DETECTED NOT DETECTED Final    Comment: (NOTE) The Coronavirus on the Respiratory Panel, DOES NOT test for the novel  Coronavirus (2019 nCoV)    Coronavirus HKU1 NOT DETECTED NOT DETECTED Final   Coronavirus NL63 NOT DETECTED NOT DETECTED Final   Coronavirus OC43 NOT DETECTED NOT DETECTED Final   Metapneumovirus NOT DETECTED NOT DETECTED Final   Rhinovirus / Enterovirus DETECTED (A) NOT DETECTED Final   Influenza A NOT DETECTED NOT DETECTED Final   Influenza B NOT DETECTED NOT DETECTED Final   Parainfluenza Virus 1 NOT DETECTED NOT DETECTED Final   Parainfluenza Virus 2 NOT DETECTED NOT DETECTED Final   Parainfluenza Virus 3 NOT DETECTED NOT DETECTED Final   Parainfluenza Virus 4 NOT DETECTED NOT DETECTED Final   Respiratory Syncytial Virus NOT DETECTED NOT DETECTED Final   Bordetella pertussis NOT DETECTED NOT DETECTED Final   Bordetella Parapertussis NOT DETECTED NOT DETECTED Final   Chlamydophila pneumoniae NOT DETECTED NOT DETECTED Final   Mycoplasma pneumoniae NOT DETECTED  NOT DETECTED Final    Comment: Performed at [facility], [address] [place], [place] [address]  Radiology Studies: ECHOCARDIOGRAM COMPLETE Result Date: 08/31/2023    ECHOCARDIOGRAM REPORT   Patient Name:   Cherl Gorney Date of Exam: 08/31/2023 Medical Rec #:  [id number]         Height:       65.0 in Accession #:    [id number]        Weight:       165.1 lb Date of Birth:  Feb 04, 1951         BSA:          1.823 m Patient Age:    72 years          BP:           91/47 mmHg Patient Gender: F                 HR:           93 bpm. Exam Location:  Inpatient Procedure: 2D Echo, Cardiac Doppler and Color Doppler (Both Spectral and Color            Flow Doppler were utilized during procedure). Indications:    Chest Pain  History:        Patient has no prior history of Echocardiogram examinations.                 Risk Factors:Hypertension.  Sonographer:    Darlys Gales Referring Phys: [facility] IMPRESSIONS  1. Left ventricular ejection fraction, by estimation, is 60 to 65%. The left ventricle has normal function. The left ventricle has no regional wall motion abnormalities. There is mild left ventricular hypertrophy of the basal-septal segment. Left ventricular diastolic parameters are consistent with Grade I diastolic dysfunction (impaired relaxation).  2. Right ventricular systolic function is normal. The right ventricular size is normal.  3. The mitral valve is normal in structure. No evidence of mitral valve regurgitation. No evidence of mitral stenosis.  4. The aortic valve is normal in structure. Aortic valve regurgitation is not visualized. No aortic stenosis is present.  5. The inferior vena cava is normal in size with greater than 50% respiratory variability, suggesting right atrial pressure of 3 mmHg. FINDINGS  Left Ventricle: Left ventricular ejection fraction, by estimation, is 60 to 65%. The left ventricle has normal function. The left ventricle has no regional wall motion  abnormalities. The left ventricular internal cavity size was normal in size. There is  mild left ventricular hypertrophy of the basal-septal segment. Left ventricular diastolic parameters are consistent with Grade I diastolic dysfunction (impaired relaxation). Right Ventricle: The right ventricular size is normal. No increase in right ventricular wall thickness. Right ventricular systolic function is normal. Left Atrium: Left atrial size was normal in size. Right Atrium: Right atrial size was normal in size. Pericardium: There is no evidence of pericardial effusion. Mitral Valve: The mitral valve is normal in structure. No evidence of mitral valve regurgitation. No evidence of mitral valve stenosis. Tricuspid Valve: The tricuspid valve is normal in structure. Tricuspid valve regurgitation is not demonstrated. No evidence of tricuspid stenosis. Aortic Valve: The aortic valve is normal in structure. Aortic valve regurgitation is not visualized. No aortic stenosis is present. Aortic valve mean gradient measures 5.0 mmHg. Aortic valve peak gradient measures 9.5 mmHg. Aortic valve area, by VTI measures 1.96 cm. Pulmonic Valve: The pulmonic valve was normal in structure. Pulmonic valve regurgitation is not visualized. No evidence of pulmonic stenosis. Aorta: The aortic root is normal in size and structure. Venous: The inferior vena cava is normal  in size with greater than 50% respiratory variability, suggesting right atrial pressure of 3 mmHg. IAS/Shunts: No atrial level shunt detected by color flow Doppler.  LEFT VENTRICLE PLAX 2D LVIDd:         2.80 cm   Diastology LVIDs:         1.90 cm   LV e' medial:    11.40 cm/s LV PW:         0.60 cm   LV E/e' medial:  9.0 LV IVS:        1.20 cm   LV e' lateral:   13.30 cm/s LVOT diam:     1.70 cm   LV E/e' lateral: 7.7 LV SV:         58 LV SV Index:   32 LVOT Area:     2.27 cm  RIGHT VENTRICLE RV S prime:     14.50 cm/s TAPSE (M-mode): 2.8 cm LEFT ATRIUM             Index         RIGHT ATRIUM           Index LA Vol (A2C):   38.0 ml 20.84 ml/m  RA Area:     12.40 cm LA Vol (A4C):   22.7 ml 12.45 ml/m  RA Volume:   27.90 ml  15.30 ml/m LA Biplane Vol: 28.7 ml 15.74 ml/m  AORTIC VALVE AV Area (Vmax):    1.74 cm AV Area (Vmean):   1.89 cm AV Area (VTI):     1.96 cm AV Vmax:           154.00 cm/s AV Vmean:          109.000 cm/s AV VTI:            0.298 m AV Peak Grad:      9.5 mmHg AV Mean Grad:      5.0 mmHg LVOT Vmax:         118.00 cm/s LVOT Vmean:        90.600 cm/s LVOT VTI:          0.257 m LVOT/AV VTI ratio: 0.86  AORTA Ao Root diam: 2.80 cm MITRAL VALVE MV Area (PHT): 2.91 cm     SHUNTS MV Decel Time: 261 msec     Systemic VTI:  0.26 m MV E velocity: 103.00 cm/s  Systemic Diam: 1.70 cm MV A velocity: 130.00 cm/s MV E/A ratio:  0.79 Donato Schultz MD Electronically signed by Donato Schultz MD Signature Date/Time: 08/31/2023/2:31:14 PM    Final    CT ABDOMEN PELVIS W CONTRAST Result Date: 08/30/2023 CLINICAL DATA:  Left lower quadrant pain EXAM: CT ABDOMEN AND PELVIS WITH CONTRAST TECHNIQUE: Multidetector CT imaging of the abdomen and pelvis was performed using the standard protocol following bolus administration of intravenous contrast. RADIATION DOSE REDUCTION: This exam was performed according to the departmental dose-optimization program which includes automated exposure control, adjustment of the mA and/or kV according to patient size and/or use of iterative reconstruction technique. CONTRAST:  75mL OMNIPAQUE IOHEXOL 350 MG/ML SOLN COMPARISON:  CT abdomen and pelvis 01/11/2019 FINDINGS: Lower chest: Right lower lobe ground-glass and airspace opacities are present compatible with pneumonia. There are scattered interstitial opacities throughout both lung bases which are new from prior. There is a trace right pleural effusion. Hepatobiliary: No focal liver abnormality is seen. Calcified granulomas are present. No gallstones, gallbladder wall thickening, or biliary dilatation.  Pancreas: Unremarkable. No pancreatic ductal dilatation or surrounding inflammatory changes. Spleen:  Calcified granulomas are present.  Normal in size. Adrenals/Urinary Tract: Adrenal glands are unremarkable. Kidneys are normal, without renal calculi, focal lesion, or hydronephrosis. Bladder is unremarkable. Stomach/Bowel: Stomach is within normal limits. Appendix is surgically absent. No evidence of bowel wall thickening, distention, or inflammatory changes. There is sigmoid colon diverticulosis. Vascular/Lymphatic: Aortic atherosclerosis. No enlarged abdominal or pelvic lymph nodes. Reproductive: Uterus and bilateral adnexa are unremarkable. Other: No abdominal wall hernia or abnormality. No abdominopelvic ascites. Musculoskeletal: Thoracic spinal cord stimulator device present. Severe degenerative changes affect the spine. IMPRESSION: 1. No acute localizing process in the abdomen or pelvis. 2. Right lower lobe pneumonia. 3. Trace right pleural effusion. 4. Sigmoid colon diverticulosis. 5. Aortic atherosclerosis. Aortic Atherosclerosis (ICD10-I70.0). Electronically Signed   By: Darliss Cheney M.D.   On: 08/30/2023 21:41    Scheduled Meds:  acidophilus  1 capsule Oral Daily   aspirin  81 mg Oral Daily   buPROPion  300 mg Oral Daily   cholecalciferol  2,000 Units Oral Daily   donepezil  10 mg Oral QHS   FLUoxetine  20 mg Oral Daily   heparin  5,000 Units Subcutaneous Q8H   montelukast  10 mg Oral QHS   multivitamin with minerals  1 tablet Oral Daily   pantoprazole  40 mg Oral Daily   polyethylene glycol  17 g Oral Daily   rosuvastatin  10 mg Oral QPM   senna-docusate  1 tablet Oral BID   Continuous Infusions:  ampicillin-sulbactam (UNASYN) IV 3 g (09/01/23 0543)     LOS: 2 days   Arnetha Courser, MD [facility] P3/28/2025, 12:20 PM

## 2023-09-01 NOTE — Plan of Care (Signed)

## 2023-09-01 NOTE — Progress Notes (Signed)
 Mobility Specialist Progress Note:   09/01/23 1115  Mobility  Activity Ambulated with assistance in room  Level of Assistance Contact guard assist, steadying assist  Assistive Device Other (Comment) (HHA)  Distance Ambulated (ft) 20 ft  Activity Response Tolerated well  Mobility Referral Yes  Mobility visit 1 Mobility  Mobility Specialist Start Time (ACUTE ONLY) [id number]  Mobility Specialist Stop Time (ACUTE ONLY) 0936  Mobility Specialist Time Calculation (min) (ACUTE ONLY) 14 min   Pt received in bed hesitant but agreeable to mobility. Requested to stay in room. Was able to ambulate short distance on 2L/min SPO2 89%. Returned to seated EOB w/ call bell and personal belongings in reach. All needs met. Left on 2L/min.   Thompson Grayer Mobility Specialist  Please contact vis Secure Chat or  Rehab Office [phone]

## 2023-09-02 ENCOUNTER — Inpatient Hospital Stay (HOSPITAL_COMMUNITY)

## 2023-09-02 DIAGNOSIS — J189 Pneumonia, unspecified organism: Secondary | ICD-10-CM | POA: Diagnosis not present

## 2023-09-02 LAB — MAGNESIUM: Magnesium: 2.1 mg/dL (ref 1.7–2.4)

## 2023-09-02 LAB — PROCALCITONIN: Procalcitonin: 2 ng/mL

## 2023-09-02 MED ORDER — LOSARTAN POTASSIUM 50 MG PO TABS
50.0000 mg | ORAL_TABLET | Freq: Every day | ORAL | Status: DC
  Administered 2023-09-02 – 2023-09-06 (×5): 50 mg via ORAL
  Filled 2023-09-02 (×5): qty 1

## 2023-09-02 MED ORDER — GERHARDT'S BUTT CREAM
TOPICAL_CREAM | Freq: Three times a day (TID) | CUTANEOUS | Status: DC
  Filled 2023-09-02: qty 60

## 2023-09-02 MED ORDER — IPRATROPIUM-ALBUTEROL 0.5-2.5 (3) MG/3ML IN SOLN
3.0000 mL | Freq: Four times a day (QID) | RESPIRATORY_TRACT | Status: DC
Start: 2023-09-02 — End: 2023-09-04
  Administered 2023-09-02 – 2023-09-04 (×7): 3 mL via RESPIRATORY_TRACT
  Filled 2023-09-02 (×7): qty 3

## 2023-09-02 MED ORDER — BISMUTH SUBSALICYLATE 262 MG/15ML PO SUSP
30.0000 mL | ORAL | Status: DC | PRN
  Administered 2023-09-03: 30 mL via ORAL
  Filled 2023-09-02: qty 236

## 2023-09-02 MED ORDER — SODIUM CHLORIDE 0.9% FLUSH: 10.0000 mL | INTRAVENOUS | Status: DC | PRN

## 2023-09-02 MED ORDER — LEVALBUTEROL HCL 0.63 MG/3ML IN NEBU: 0.6300 mg | INHALATION_SOLUTION | Freq: Four times a day (QID) | RESPIRATORY_TRACT | Status: DC | PRN

## 2023-09-02 NOTE — TOC Initial Note (Signed)
 Transition of Care [facility]) - Initial/Assessment Note    Patient Details  Name: Shelly [name] MRN: [id number] Date of Birth: [birth year]  Transition of Care [facility]) CM/SW Contact:    Lawerance Sabal, RN Phone Number: 09/02/2023, 2:12 PM  Clinical Narrative:                  Oxygen and nebulizer to be delivered to room through [facility]  Expected Discharge Plan: Home/Self Care Barriers to Discharge: Continued Medical Work up   Patient Goals and CMS Choice            Expected Discharge Plan and Services                         DME Arranged: Oxygen, Nebulizer machine DME Agency: [employer] Date DME Agency Contacted: 09/02/23 Time DME Agency Contacted: [phone] Representative spoke with at DME Agency: Vaughan Basta            Prior Living Arrangements/Services                       Activities of Daily Living   ADL Screening (condition at time of admission) Independently performs ADLs?: Yes (appropriate for developmental age) Is the patient deaf or have difficulty hearing?: No Does the patient have difficulty seeing, even when wearing glasses/contacts?: No Does the patient have difficulty concentrating, remembering, or making decisions?: No  Permission Sought/Granted                  Emotional Assessment              Admission diagnosis:  CAP (community acquired pneumonia) [J18.9] Acute respiratory failure with hypoxia (HCC) [J96.01] Pneumonia of right upper lobe due to infectious organism [J18.9] Patient Active Problem List   Diagnosis Date Noted   CAP (community acquired pneumonia) 08/30/2023   Anxiety 05/19/2022   Acute bronchitis 04/11/2022   GERD (gastroesophageal reflux disease) 02/22/2022   Upper airway cough syndrome 03/26/2019   Chronic pain 01/10/2019   MCI (mild cognitive impairment) 03/20/2017   Gait abnormality 03/20/2017   History of right below knee amputation (HCC) 07/11/2016   Adenomatous colon polyp 05/10/2016   Coronary  artery calcification 01/07/2016   Essential hypertension 01/07/2016   Back pain 09/09/2015   Abnormal CXR 05/16/2014   Cough variant asthma ? assoc with UACS 05/15/2014   Acute post-operative pain 02/18/2012   Adiposity 02/17/2012   Does use eyeglasses 02/13/2012   H/O arthrodesis 10/21/2011   Fibromyalgia 04/11/2011   HLD (hyperlipidemia) 04/11/2011   Avascular necrosis of talus (HCC) 04/11/2011   Osteomyelitis of ankle (HCC) 04/11/2011   Leg edema 09/08/2010   PCP:  [name], MD Pharmacy:   [facility] [phone] [place], [place] - [address] AT [facility] [address] [facility] [address] [place] [address] Phone: [phone] Fax: [phone]  [facility] - [place], [place] - [address] [place] [address] Phone: [phone] Fax: [phone]     Social Drivers of Health (SDOH) Social History: SDOH Screenings   Food Insecurity: No Food Insecurity (08/30/2023)  Housing: Low Risk  (08/30/2023)  Transportation Needs: No Transportation Needs (08/30/2023)  Utilities: Not At Risk (08/30/2023)  Social Connections: Moderately Isolated (08/30/2023)  Tobacco Use: Medium Risk (08/30/2023)   SDOH Interventions:     Readmission Risk Interventions     No data to display

## 2023-09-02 NOTE — Progress Notes (Signed)
 SATURATION QUALIFICATIONS: (This note is used to comply with regulatory documentation for home oxygen)  Patient Saturations on Room Air at Rest = 88%  Patient Saturations on Room Air while Ambulating = 84%  Patient Saturations on 4 Liters of oxygen while Ambulating = 94%  Please briefly explain why patient needs home oxygen: Patients saturation drops while ambulating and 3L needed at rest

## 2023-09-02 NOTE — Progress Notes (Signed)
 Mobility Specialist Progress Note:    09/02/23 1000  Mobility  Activity Ambulated with assistance in hallway  Level of Assistance Contact guard assist, steadying assist  Assistive Device Front wheel walker  Distance Ambulated (ft) 200 ft  Activity Response Tolerated well  Mobility Referral Yes  Mobility visit 1 Mobility  Mobility Specialist Start Time (ACUTE ONLY) 0913  Mobility Specialist Stop Time (ACUTE ONLY) [id number]  Mobility Specialist Time Calculation (min) (ACUTE ONLY) 11 min   Received pt on EOB having no complaints and agreeable to mobility. Pt was asymptomatic throughout ambulation and returned to room w/o fault. Left on EOB w/ call bell in reach and all needs met.   D'Vante Earlene Plater Mobility Specialist Please contact via Special educational needs teacher or Rehab office at [phone]

## 2023-09-02 NOTE — Plan of Care (Signed)

## 2023-09-02 NOTE — Plan of Care (Signed)
 Patient AAOx4. ExOx done, NC3L at rest, 4L during ambulation. Multiple BMs today, incontinent. LUA midline. RBKA, prosthetists in place. Ambulates with RW. Safety precautions maintained.    Problem: Education: Goal: Knowledge of General Education information will improve Description: Including pain rating scale, medication(s)/side effects and non-pharmacologic comfort measures Outcome: Progressing   Problem: Health Behavior/Discharge Planning: Goal: Ability to manage health-related needs will improve Outcome: Progressing   Problem: Clinical Measurements: Goal: Respiratory complications will improve Outcome: Progressing   Problem: Activity: Goal: Risk for activity intolerance will decrease Outcome: Progressing

## 2023-09-02 NOTE — Progress Notes (Signed)
 PROGRESS NOTE  Shelly [name]  ZOX:[id number] DOB: [birth year] DOA: 08/30/2023 PCP: [name], MD   Brief Narrative: Patient is a 73 year old female with history of right lower extremity avascular necrosis status post right BKA, hypertension, hyperlipidemia, Alzheimer's dementia, asthma who lives alone presented to the emergency department with complaint of fatigue, urinary frequency, cough, nausea, vomiting, abdominal pain, weakness.  On presentation, she was hypoxic, required 2 L of oxygen.  Lab work showed a sodium of 131, elevated CRP.  COVID/flu/RSV negative.  UA was not suspicious for UTI.  Chest x-ray showed bilateral pneumonia.  CT chest showed right lower lobe pneumonia.  Admitted for further management of community-acquired pneumonia/aspiration pneumonia.  Speech therapy consulted  3/28: Continue to require 2 L of oxygen with no baseline use.  Patient would like to go home without oxygen if possible so giving 1-2 more days.  3/29: Continues to have significant exertional dyspnea and desaturate in mid 80s requiring 3 to 4 L of oxygen with ambulation.  Family does not want her to go home with this much of oxygen as she lives alone.  Adding some breathing treatment.  Improving procalcitonin. Home oxygen ordered.  Patient likely has some underlying COPD as she was smoking for more than 40 years-will get benefit from outpatient pulmonary evaluation. Also ordered a nebulizer machine.  Assessment & Plan:  Principal Problem:   CAP (community acquired pneumonia)  Community-acquired pneumonia/aspiration pneumonia  Acute hypoxic respiratory failure. presented with cough.  Initially hypoxic requiring 2 L of oxygen, still needing oxygen especially with ambulation with no baseline use. Chest x-ray showed right lower lobe and lingular pneumonia.  CT abdomen/pelvis showed right lower lobe pneumonia.  Urine antigen positive for streptococcal pneumonia antigen. Suspected aspiration pneumonia.   Currently on Unasyn.  Speech therapy consulted. Not on oxygen at home.  Continue to wean. Procalcitonin elevated but trending down.  Cultures remain negative -Continue with Unasyn-we will treat for total of 7 days due to very slow response.  Abdominal pain, nausea, vomiting:CT abdomen/pelvis did not show any acute findings in abdomen/pelvis.  Started gentle IV fluids, supportive care, antiemetics.  GI pathogen panel negative.  Abdominal pain has been resolved Had a bowel movement. -Continue with bowel regimen  Mild hyponatremia: Likely from dehydration.  Resolved with IV fluids  Hypertension: Blood pressure now started trending up -Restarting home Cozaar  Hyperlipidemia: On statin  Alzheimer's dementia: Lives alone.  Continue delirium precaution, frequent reorientation.  Minimize narcotics, sedatives.  Currently alert and oriented  Debility/deconditioning: Presented with weakness. Patient is status post right BKA PT with no follow-up recommendations  DVT prophylaxis:heparin injection 5,000 Units Start: 08/30/23 2200     Code Status: Full Code  Family Communication: Discussed with daughter on phone  Patient status:Inpatient  Patient is from :Home  Anticipated discharge WJ:XBJY  Estimated DC date:1-2 days   Consultants: None  Procedures: None  Antimicrobials:  Anti-infectives (From admission, onward)    Start     Dose/Rate Route Frequency Ordered Stop   08/30/23 1830  Ampicillin-Sulbactam (UNASYN) 3 g in sodium chloride 0.9 % 100 mL IVPB        3 g 200 mL/hr over 30 Minutes Intravenous Every 6 hours 08/30/23 1744 09/04/23 1829   08/30/23 1800  cefTRIAXone (ROCEPHIN) 1 g in sodium chloride 0.9 % 100 mL IVPB  Status:  Discontinued        1 g 200 mL/hr over 30 Minutes Intravenous Every 24 hours 08/30/23 1704 08/30/23 1742   08/30/23 1800  azithromycin (  ZITHROMAX) 500 mg in sodium chloride 0.9 % 250 mL IVPB  Status:  Discontinued        500 mg 250 mL/hr over 60 Minutes  Intravenous Every 24 hours 08/30/23 1704 08/31/23 0951   08/30/23 1800  doxycycline (VIBRAMYCIN) 100 mg in sodium chloride 0.9 % 250 mL IVPB  Status:  Discontinued        100 mg 125 mL/hr over 120 Minutes Intravenous Every 12 hours 08/30/23 1706 08/30/23 1743   08/30/23 1230  cefTRIAXone (ROCEPHIN) 1 g in sodium chloride 0.9 % 100 mL IVPB        1 g 200 mL/hr over 30 Minutes Intravenous  Once 08/30/23 1220 08/30/23 1329   08/30/23 1230  doxycycline (VIBRAMYCIN) 100 mg in sodium chloride 0.9 % 250 mL IVPB        100 mg 125 mL/hr over 120 Minutes Intravenous  Once 08/30/23 1220 08/30/23 1554       Subjective: Patient continued to feel short of breath especially with ambulation and continue to require oxygen.  She was okay going home with oxygen in a day or so.  Objective: Vitals:   09/01/23 1948 09/01/23 2358 09/02/23 0428 09/02/23 0746  BP: (!) 109/57 130/60 134/62 134/76  Pulse: 88 82 90 79  Resp: 20 19 20 18   Temp: 98.7 F (37.1 C) 97.8 F (36.6 C) 98.8 F (37.1 C) 98 F (36.7 C)  TempSrc: Oral Oral Oral   SpO2: 91% 92% 92% 93%  Weight:      Height:        Intake/Output Summary (Last 24 hours) at 09/02/2023 1321 Last data filed at 09/01/2023 2026 Gross per 24 hour  Intake 934.87 ml  Output --  Net 934.87 ml   Filed Weights   08/30/23 1033  Weight: 74.9 kg    Examination:  General.  Frail elderly lady, in no acute distress. Pulmonary.  Few rhonchi on right, normal respiratory effort. CV.  Regular rate and rhythm, no JVD, rub or murmur. Abdomen.  Soft, nontender, nondistended, BS positive. CNS.  Alert and oriented .  No focal neurologic deficit. Extremities.  Right BKA with prosthesis Psychiatry.  Judgment and insight appears normal.   Data Reviewed: I have personally reviewed following labs and imaging studies  CBC: Recent Labs  Lab 08/30/23 1037 08/30/23 1745 08/31/23 0801 09/01/23 0812  WBC 5.5 4.5 7.4 7.7  NEUTROABS 4.1  --  6.0  --   HGB 11.4*  10.1* 10.4* 10.2*  HCT 34.7* 30.3* 32.1* 31.6*  MCV 96.1 96.5 97.9 97.5  PLT 190 165 179 212   Basic Metabolic Panel: Recent Labs  Lab 08/30/23 1037 08/30/23 1745 08/31/23 0801 09/01/23 0812 09/02/23 0621  NA 131*  --  137 138  --   K 4.4  --  4.2 3.1*  --   CL 95*  --  104 104  --   CO2 26  --  22 25  --   GLUCOSE 106*  --  91 89  --   BUN 22  --  16 9  --   CREATININE 0.92 1.18* 0.84 0.57  --   CALCIUM 8.5*  --  7.8* 7.6*  --   MG  --   --  2.3 2.1 2.1     Recent Results (from the past 240 hours)  Resp panel by RT-PCR (RSV, Flu A&B, Covid) Anterior Nasal Swab     Status: None   Collection Time: 08/30/23 10:37 AM   Specimen: Anterior Nasal  Swab  Result Value Ref Range Status   SARS Coronavirus 2 by RT PCR NEGATIVE NEGATIVE Final    Comment: (NOTE) SARS-CoV-2 target nucleic acids are NOT DETECTED.  The SARS-CoV-2 RNA is generally detectable in upper respiratory specimens during the acute phase of infection. The lowest concentration of SARS-CoV-2 viral copies this assay can detect is 138 copies/mL. A negative result does not preclude SARS-Cov-2 infection and should not be used as the sole basis for treatment or other patient management decisions. A negative result may occur with  improper specimen collection/handling, submission of specimen other than nasopharyngeal swab, presence of viral mutation(s) within the areas targeted by this assay, and inadequate number of viral copies(<138 copies/mL). A negative result must be combined with clinical observations, patient history, and epidemiological information. The expected result is Negative.  Fact Sheet for Patients:  BloggerCourse.com  Fact Sheet for Healthcare Providers:  SeriousBroker.it  This test is no t yet approved or cleared by the [place] FDA and  has been authorized for detection and/or diagnosis of SARS-CoV-2 by FDA under an Emergency Use  Authorization (EUA). This EUA will remain  in effect (meaning this test can be used) for the duration of the COVID-19 declaration under Section 564(b)(1) of the Act, 21 U.S.C.section 360bbb-3(b)(1), unless the authorization is terminated  or revoked sooner.       Influenza A by PCR NEGATIVE NEGATIVE Final   Influenza B by PCR NEGATIVE NEGATIVE Final    Comment: (NOTE) The Xpert Xpress SARS-CoV-2/FLU/RSV plus assay is intended as an aid in the diagnosis of influenza from Nasopharyngeal swab specimens and should not be used as a sole basis for treatment. Nasal washings and aspirates are unacceptable for Xpert Xpress SARS-CoV-2/FLU/RSV testing.  Fact Sheet for Patients: BloggerCourse.com  Fact Sheet for Healthcare Providers: SeriousBroker.it  This test is not yet approved or cleared by the [place] FDA and has been authorized for detection and/or diagnosis of SARS-CoV-2 by FDA under an Emergency Use Authorization (EUA). This EUA will remain in effect (meaning this test can be used) for the duration of the COVID-19 declaration under Section 564(b)(1) of the Act, 21 U.S.C. section 360bbb-3(b)(1), unless the authorization is terminated or revoked.     Resp Syncytial Virus by PCR NEGATIVE NEGATIVE Final    Comment: (NOTE) Fact Sheet for Patients: BloggerCourse.com  Fact Sheet for Healthcare Providers: SeriousBroker.it  This test is not yet approved or cleared by the [place] FDA and has been authorized for detection and/or diagnosis of SARS-CoV-2 by FDA under an Emergency Use Authorization (EUA). This EUA will remain in effect (meaning this test can be used) for the duration of the COVID-19 declaration under Section 564(b)(1) of the Act, 21 U.S.C. section 360bbb-3(b)(1), unless the authorization is terminated or revoked.  Performed at [facility], [address] [place], [place] [address]   MRSA Next Gen by PCR, Nasal     Status: None   Collection Time: 08/31/23  4:35 AM   Specimen: Nasal Mucosa; Nasal Swab  Result Value Ref Range Status   MRSA by PCR Next Gen NOT DETECTED NOT DETECTED Final    Comment: (NOTE) The GeneXpert MRSA Assay (FDA approved for NASAL specimens only), is one component of a comprehensive MRSA colonization surveillance program. It is not intended to diagnose MRSA infection nor to guide or monitor treatment for MRSA infections. Test performance is not FDA approved in patients less than 82 years old. Performed at [facility], [address]  [place], [place] [address]   Culture, blood (Routine X 2) w Reflex to ID Panel     Status: None (Preliminary result)   Collection Time: 08/31/23 12:58 PM   Specimen: BLOOD LEFT HAND  Result Value Ref Range Status   Specimen Description BLOOD LEFT HAND  Final   Special Requests   Final    BOTTLES DRAWN AEROBIC AND ANAEROBIC Blood Culture results may not be optimal due to an inadequate volume of blood received in culture bottles   Culture   Final    NO GROWTH 2 DAYS Performed at [facility], [address], [place], [place] [address]    Report Status PENDING  Incomplete  Culture, blood (Routine X 2) w Reflex to ID Panel     Status: None (Preliminary result)   Collection Time: 08/31/23 12:58 PM   Specimen: BLOOD RIGHT HAND  Result Value Ref Range Status   Specimen Description BLOOD RIGHT HAND  Final   Special Requests   Final    BOTTLES DRAWN AEROBIC AND ANAEROBIC Blood Culture results may not be optimal due to an inadequate volume of blood received in culture bottles   Culture   Final    NO GROWTH 2 DAYS Performed at [facility], [address] [place], [place] [address]    Report Status PENDING  Incomplete  Gastrointestinal Panel by PCR , Stool     Status: None   Collection Time: 09/01/23  2:41 AM   Specimen: Stool  Result Value Ref Range  Status   Campylobacter species NOT DETECTED NOT DETECTED Final   Plesimonas shigelloides NOT DETECTED NOT DETECTED Final   Salmonella species NOT DETECTED NOT DETECTED Final   Yersinia enterocolitica NOT DETECTED NOT DETECTED Final   Vibrio species NOT DETECTED NOT DETECTED Final   Vibrio cholerae NOT DETECTED NOT DETECTED Final   Enteroaggregative E coli (EAEC) NOT DETECTED NOT DETECTED Final   Enteropathogenic E coli (EPEC) NOT DETECTED NOT DETECTED Final   Enterotoxigenic E coli (ETEC) NOT DETECTED NOT DETECTED Final   Shiga like toxin producing E coli (STEC) NOT DETECTED NOT DETECTED Final   Shigella/Enteroinvasive E coli (EIEC) NOT DETECTED NOT DETECTED Final   Cryptosporidium NOT DETECTED NOT DETECTED Final   Cyclospora cayetanensis NOT DETECTED NOT DETECTED Final   Entamoeba histolytica NOT DETECTED NOT DETECTED Final   Giardia lamblia NOT DETECTED NOT DETECTED Final   Adenovirus F40/41 NOT DETECTED NOT DETECTED Final   Astrovirus NOT DETECTED NOT DETECTED Final   Norovirus GI/GII NOT DETECTED NOT DETECTED Final   Rotavirus A NOT DETECTED NOT DETECTED Final   Sapovirus (I, II, IV, and V) NOT DETECTED NOT DETECTED Final    Comment: Performed at [facility], [address] [place], [place] [address]  Respiratory (~20 pathogens) panel by PCR     Status: Abnormal   Collection Time: 09/01/23  2:51 AM   Specimen: Nasopharyngeal Swab; Respiratory  Result Value Ref Range Status   Adenovirus NOT DETECTED NOT DETECTED Final   Coronavirus 229E NOT DETECTED NOT DETECTED Final    Comment: (NOTE) The Coronavirus on the Respiratory Panel, DOES NOT test for the novel  Coronavirus (2019 nCoV)    Coronavirus HKU1 NOT DETECTED NOT DETECTED Final   Coronavirus NL63 NOT DETECTED NOT DETECTED Final   Coronavirus OC43 NOT DETECTED NOT DETECTED Final   Metapneumovirus NOT DETECTED NOT DETECTED Final   Rhinovirus / Enterovirus DETECTED (A) NOT DETECTED Final   Influenza A NOT DETECTED  NOT  DETECTED Final   Influenza B NOT DETECTED NOT DETECTED Final   Parainfluenza Virus 1 NOT DETECTED NOT DETECTED Final   Parainfluenza Virus 2 NOT DETECTED NOT DETECTED Final   Parainfluenza Virus 3 NOT DETECTED NOT DETECTED Final   Parainfluenza Virus 4 NOT DETECTED NOT DETECTED Final   Respiratory Syncytial Virus NOT DETECTED NOT DETECTED Final   Bordetella pertussis NOT DETECTED NOT DETECTED Final   Bordetella Parapertussis NOT DETECTED NOT DETECTED Final   Chlamydophila pneumoniae NOT DETECTED NOT DETECTED Final   Mycoplasma pneumoniae NOT DETECTED NOT DETECTED Final    Comment: Performed at [facility], [address], [place], [place] [address]     Radiology Studies: No results found.   Scheduled Meds:  acidophilus  1 capsule Oral Daily   aspirin  81 mg Oral Daily   buPROPion  300 mg Oral Daily   cholecalciferol  2,000 Units Oral Daily   donepezil  10 mg Oral QHS   FLUoxetine  20 mg Oral Daily   heparin  5,000 Units Subcutaneous Q8H   montelukast  10 mg Oral QHS   multivitamin with minerals  1 tablet Oral Daily   pantoprazole  40 mg Oral Daily   polyethylene glycol  17 g Oral Daily   rosuvastatin  10 mg Oral QPM   senna-docusate  1 tablet Oral BID   Continuous Infusions:  ampicillin-sulbactam (UNASYN) IV 3 g (09/02/23 1313)     LOS: 3 days   Arnetha Courser, MD [facility] P3/29/2025, 1:21 PM

## 2023-09-03 DIAGNOSIS — J189 Pneumonia, unspecified organism: Secondary | ICD-10-CM | POA: Diagnosis not present

## 2023-09-03 LAB — COMPREHENSIVE METABOLIC PANEL WITH GFR
ALT: 46 U/L — ABNORMAL HIGH (ref 0–44)
AST: 53 U/L — ABNORMAL HIGH (ref 15–41)
Albumin: 2.5 g/dL — ABNORMAL LOW (ref 3.5–5.0)
Alkaline Phosphatase: 64 U/L (ref 38–126)
Anion gap: 13 (ref 5–15)
BUN: 7 mg/dL — ABNORMAL LOW (ref 8–23)
CO2: 24 mmol/L (ref 22–32)
Calcium: 8 mg/dL — ABNORMAL LOW (ref 8.9–10.3)
Chloride: 100 mmol/L (ref 98–111)
Creatinine, Ser: 0.7 mg/dL (ref 0.44–1.00)
GFR, Estimated: >60 mL/min (ref >60)
Glucose, Bld: 106 mg/dL — ABNORMAL HIGH (ref 70–99)
Potassium: 3.4 mmol/L — ABNORMAL LOW (ref 3.5–5.1)
Sodium: 137 mmol/L (ref 135–145)
Total Bilirubin: 1.2 mg/dL (ref 0.0–1.2)
Total Protein: 5.9 g/dL — ABNORMAL LOW (ref 6.5–8.1)

## 2023-09-03 LAB — LEGIONELLA PNEUMOPHILA SEROGP 1 UR AG: L. pneumophila Serogp 1 Ur Ag: NEGATIVE

## 2023-09-03 LAB — BLOOD GAS, ARTERIAL
Acid-Base Excess: 1.1 mmol/L (ref 0.0–2.0)
Bicarbonate: 25.1 mmol/L (ref 20.0–28.0)
O2 Saturation: 95.5 %
Patient temperature: 37
pCO2 arterial: 37 mmHg (ref 32–48)
pH, Arterial: 7.44 (ref 7.35–7.45)
pO2, Arterial: 81 mmHg — ABNORMAL LOW (ref 83–108)

## 2023-09-03 LAB — CBC
HCT: 32.8 % — ABNORMAL LOW (ref 36.0–46.0)
Hemoglobin: 11 g/dL — ABNORMAL LOW (ref 12.0–15.0)
MCH: 31.6 pg (ref 26.0–34.0)
MCHC: 33.5 g/dL (ref 30.0–36.0)
MCV: 94.3 fL (ref 80.0–100.0)
Platelets: 248 10*3/uL (ref 150–400)
RBC: 3.48 MIL/uL — ABNORMAL LOW (ref 3.87–5.11)
RDW: 12.8 % (ref 11.5–15.5)
WBC: 11 10*3/uL — ABNORMAL HIGH (ref 4.0–10.5)
nRBC: 0 % (ref 0.0–0.2)

## 2023-09-03 LAB — GLUCOSE, CAPILLARY: Glucose-Capillary: 138 mg/dL — ABNORMAL HIGH (ref 70–99)

## 2023-09-03 LAB — PHOSPHORUS: Phosphorus: 2.7 mg/dL (ref 2.5–4.6)

## 2023-09-03 LAB — PROCALCITONIN: Procalcitonin: 0.89 ng/mL

## 2023-09-03 LAB — MAGNESIUM: Magnesium: 1.8 mg/dL (ref 1.7–2.4)

## 2023-09-03 LAB — VITAMIN D 25 HYDROXY (VIT D DEFICIENCY, FRACTURES): Vit D, 25-Hydroxy: 117.47 ng/mL — ABNORMAL HIGH (ref 30–100)

## 2023-09-03 MED ORDER — BUDESONIDE 0.25 MG/2ML IN SUSP
0.2500 mg | Freq: Two times a day (BID) | RESPIRATORY_TRACT | Status: DC
  Administered 2023-09-03 (×2): 0.25 mg via RESPIRATORY_TRACT
  Filled 2023-09-03 (×2): qty 2

## 2023-09-03 MED ORDER — METHYLPREDNISOLONE SODIUM SUCC 40 MG IJ SOLR
40.0000 mg | Freq: Two times a day (BID) | INTRAMUSCULAR | Status: AC
  Administered 2023-09-03 (×2): 40 mg via INTRAVENOUS
  Filled 2023-09-03 (×2): qty 1

## 2023-09-03 MED ORDER — ARFORMOTEROL TARTRATE 15 MCG/2ML IN NEBU
15.0000 ug | INHALATION_SOLUTION | Freq: Two times a day (BID) | RESPIRATORY_TRACT | Status: DC
  Administered 2023-09-03 – 2023-09-06 (×7): 15 ug via RESPIRATORY_TRACT
  Filled 2023-09-03 (×7): qty 2

## 2023-09-03 MED ORDER — VANCOMYCIN HCL 1250 MG/250ML IV SOLN
1250.0000 mg | INTRAVENOUS | Status: DC
  Administered 2023-09-04: 1250 mg via INTRAVENOUS
  Filled 2023-09-03: qty 250

## 2023-09-03 MED ORDER — POTASSIUM CHLORIDE CRYS ER 20 MEQ PO TBCR
20.0000 meq | EXTENDED_RELEASE_TABLET | Freq: Once | ORAL | Status: AC
  Administered 2023-09-03: 20 meq via ORAL
  Filled 2023-09-03: qty 1

## 2023-09-03 MED ORDER — ACETYLCYSTEINE 20 % IN SOLN
3.0000 mL | Freq: Two times a day (BID) | RESPIRATORY_TRACT | Status: DC
  Administered 2023-09-03 – 2023-09-05 (×5): 3 mL via RESPIRATORY_TRACT
  Filled 2023-09-03 (×10): qty 4

## 2023-09-03 MED ORDER — PREDNISONE 20 MG PO TABS
40.0000 mg | ORAL_TABLET | Freq: Every day | ORAL | Status: DC
  Administered 2023-09-04 – 2023-09-06 (×3): 40 mg via ORAL
  Filled 2023-09-03 (×4): qty 2

## 2023-09-03 MED ORDER — VANCOMYCIN HCL 1750 MG/350ML IV SOLN
1750.0000 mg | Freq: Once | INTRAVENOUS | Status: AC
  Administered 2023-09-03: 1750 mg via INTRAVENOUS
  Filled 2023-09-03: qty 350

## 2023-09-03 MED ORDER — GUAIFENESIN ER 600 MG PO TB12
600.0000 mg | ORAL_TABLET | Freq: Two times a day (BID) | ORAL | Status: DC
  Administered 2023-09-03 – 2023-09-06 (×7): 600 mg via ORAL
  Filled 2023-09-03 (×7): qty 1

## 2023-09-03 MED ORDER — PIPERACILLIN-TAZOBACTAM 3.375 G IVPB
3.3750 g | Freq: Three times a day (TID) | INTRAVENOUS | Status: DC
  Administered 2023-09-03 – 2023-09-06 (×11): 3.375 g via INTRAVENOUS
  Filled 2023-09-03 (×11): qty 50

## 2023-09-03 MED ORDER — POTASSIUM CHLORIDE CRYS ER 20 MEQ PO TBCR
40.0000 meq | EXTENDED_RELEASE_TABLET | Freq: Once | ORAL | Status: AC
  Administered 2023-09-03: 40 meq via ORAL
  Filled 2023-09-03: qty 2

## 2023-09-03 MED ORDER — FUROSEMIDE 10 MG/ML IJ SOLN
40.0000 mg | Freq: Once | INTRAMUSCULAR | Status: AC
  Administered 2023-09-03: 40 mg via INTRAVENOUS
  Filled 2023-09-03: qty 4

## 2023-09-03 MED ORDER — BENZONATATE 100 MG PO CAPS
100.0000 mg | ORAL_CAPSULE | ORAL | Status: DC | PRN
  Administered 2023-09-03 (×3): 100 mg via ORAL
  Filled 2023-09-03 (×3): qty 1

## 2023-09-03 MED ORDER — HYDROCOD POLI-CHLORPHE POLI ER 10-8 MG/5ML PO SUER
5.0000 mL | Freq: Two times a day (BID) | ORAL | Status: DC | PRN
  Administered 2023-09-03 – 2023-09-04 (×2): 5 mL via ORAL
  Filled 2023-09-03 (×2): qty 5

## 2023-09-03 NOTE — Plan of Care (Signed)

## 2023-09-03 NOTE — Progress Notes (Signed)
 PROGRESS NOTE  Shelly [name]  NWG:[id number] DOB: 03/19/1951 DOA: 08/30/2023 PCP: [name], MD   Brief Narrative: Patient is a 73 year old female with history of right lower extremity avascular necrosis status post right BKA, hypertension, hyperlipidemia, Alzheimer's dementia, asthma who lives alone presented to the emergency department with complaint of fatigue, urinary frequency, cough, nausea, vomiting, abdominal pain, weakness.  On presentation, she was hypoxic, required 2 L of oxygen.  Lab work showed a sodium of 131, elevated CRP.  COVID/flu/RSV negative.  UA was not suspicious for UTI.  Chest x-ray showed bilateral pneumonia.  CT chest showed right lower lobe pneumonia.  Admitted for further management of community-acquired pneumonia/aspiration pneumonia.  Speech therapy consulted  3/28: Continue to require 2 L of oxygen with no baseline use.  Patient would like to go home without oxygen if possible so giving 1-2 more days.  3/29: Continues to have significant exertional dyspnea and desaturate in mid 80s requiring 3 to 4 L of oxygen with ambulation.  Family does not want her to go home with this much of oxygen as she lives alone.  Adding some breathing treatment.  Improving procalcitonin. Home oxygen ordered.  Patient likely has some underlying COPD as she was smoking for more than 40 years-will get benefit from outpatient pulmonary evaluation. Also ordered a nebulizer machine.  Assessment & Plan:  Principal Problem:   CAP (community acquired pneumonia)  Community-acquired pneumonia/aspiration pneumonia  Acute hypoxic respiratory failure. presented with cough.  Initially hypoxic requiring 2 L of oxygen, still needing oxygen especially with ambulation with no baseline use. Chest x-ray showed right lower lobe and lingular pneumonia.  CT abdomen/pelvis showed right lower lobe pneumonia.  Urine antigen positive for streptococcal pneumonia antigen. Suspected aspiration pneumonia.   Currently on Unasyn.  Speech therapy consulted. Not on oxygen at home.  Continue to wean. Procalcitonin elevated but trending down.  Cultures remain negative -Continue with Unasyn-we will treat for total of 7 days due to very slow response. 3/30 ABG reviewed, no CO2 retention.  Started Solu-Medrol 40 mg IV twice daily x 2 doses followed by oral prednisone for 4 days.  Started Brovana nebs and Pulmicort nebs twice daily. Started Mucinex 600 twice daily Continue DuoNeb every 6 hourly Started Lasix 40 mg IV twice daily due to edema of LLE and SOB  Abdominal pain, nausea, vomiting:CT abdomen/pelvis did not show any acute findings in abdomen/pelvis.  Started gentle IV fluids, supportive care, antiemetics.  GI pathogen panel negative.  Abdominal pain has been resolved Had a bowel movement. -Continue with bowel regimen  Mild hyponatremia: Likely from dehydration.  Resolved with IV fluids  Hypertension: Blood pressure now started trending up -Restarting home Cozaar  Hyperlipidemia: On statin  Alzheimer's dementia: Lives alone.  Continue delirium precaution, frequent reorientation.  Minimize narcotics, sedatives.  Currently alert and oriented  Debility/deconditioning: Presented with weakness. Patient is status post right BKA PT with no follow-up recommendations  DVT prophylaxis:heparin injection 5,000 Units Start: 08/30/23 2200     Code Status: Full Code  Family Communication: Discussed with daughter on phone  Patient status:Inpatient  Patient is from :Home  Anticipated discharge VH:QION  Estimated DC date:1-2 days   Consultants: None  Procedures: None  Antimicrobials:  Anti-infectives (From admission, onward)    Start     Dose/Rate Route Frequency Ordered Stop   09/04/23 0500  vancomycin (VANCOREADY) IVPB 1250 mg/250 mL        1,250 mg 166.7 mL/hr over 90 Minutes Intravenous Every 24 hours 09/03/23 0059  09/03/23 0600  piperacillin-tazobactam (ZOSYN) IVPB 3.375 g         3.375 g 12.5 mL/hr over 240 Minutes Intravenous Every 8 hours 09/03/23 0059     09/03/23 0200  vancomycin (VANCOREADY) IVPB 1750 mg/350 mL        1,750 mg 175 mL/hr over 120 Minutes Intravenous  Once 09/03/23 0059 09/03/23 0515   08/30/23 1830  Ampicillin-Sulbactam (UNASYN) 3 g in sodium chloride 0.9 % 100 mL IVPB  Status:  Discontinued        3 g 200 mL/hr over 30 Minutes Intravenous Every 6 hours 08/30/23 1744 09/03/23 0043   08/30/23 1800  cefTRIAXone (ROCEPHIN) 1 g in sodium chloride 0.9 % 100 mL IVPB  Status:  Discontinued        1 g 200 mL/hr over 30 Minutes Intravenous Every 24 hours 08/30/23 1704 08/30/23 1742   08/30/23 1800  azithromycin (ZITHROMAX) 500 mg in sodium chloride 0.9 % 250 mL IVPB  Status:  Discontinued        500 mg 250 mL/hr over 60 Minutes Intravenous Every 24 hours 08/30/23 1704 08/31/23 0951   08/30/23 1800  doxycycline (VIBRAMYCIN) 100 mg in sodium chloride 0.9 % 250 mL IVPB  Status:  Discontinued        100 mg 125 mL/hr over 120 Minutes Intravenous Every 12 hours 08/30/23 1706 08/30/23 1743   08/30/23 1230  cefTRIAXone (ROCEPHIN) 1 g in sodium chloride 0.9 % 100 mL IVPB        1 g 200 mL/hr over 30 Minutes Intravenous  Once 08/30/23 1220 08/30/23 1329   08/30/23 1230  doxycycline (VIBRAMYCIN) 100 mg in sodium chloride 0.9 % 250 mL IVPB        100 mg 125 mL/hr over 120 Minutes Intravenous  Once 08/30/23 1220 08/30/23 1554       Subjective: Patient was seen and examined at bedside.  Overnight patient had shortness of breath and O2 sats dropped to 88%, x-ray showed worsening of pneumonia. In the morning time patient was sleepy and altered, having significant shortness of breath. Will be managing as above discussed. Family was at bedside and daughter who is POA was on the phone, management plan discussed.   Objective: Vitals:   09/03/23 0320 09/03/23 0544 09/03/23 0744 09/03/23 0853  BP:  (!) 145/78 136/77   Pulse:  74 77 79  Resp:  18 18 (!) 22  Temp:   98.7 F (37.1 C) 97.9 F (36.6 C)   TempSrc:  Oral    SpO2: 94% 97% 96% 96%  Weight:      Height:        Intake/Output Summary (Last 24 hours) at 09/03/2023 1155 Last data filed at 09/02/2023 1800 Gross per 24 hour  Intake --  Output 500 ml  Net -500 ml   Filed Weights   08/30/23 1033  Weight: 74.9 kg    Examination:  General.  Frail elderly lady, in no acute distress. Pulmonary.  Few rhonchi on right, normal respiratory effort. CV.  Regular rate and rhythm, no JVD, rub or murmur. Abdomen.  Soft, nontender, nondistended, BS positive. CNS.  Alert and oriented .  No focal neurologic deficit. Extremities.  Right BKA with prosthesis Psychiatry.  Judgment and insight appears normal.   Data Reviewed: I have personally reviewed following labs and imaging studies  CBC: Recent Labs  Lab 08/30/23 1037 08/30/23 1745 08/31/23 0801 09/01/23 0812 09/03/23 0200  WBC 5.5 4.5 7.4 7.7 11.0*  NEUTROABS 4.1  --  6.0  --   --   HGB 11.4* 10.1* 10.4* 10.2* 11.0*  HCT 34.7* 30.3* 32.1* 31.6* 32.8*  MCV 96.1 96.5 97.9 97.5 94.3  PLT 190 165 179 212 248   Basic Metabolic Panel: Recent Labs  Lab 08/30/23 1037 08/30/23 1745 08/31/23 0801 09/01/23 0812 09/02/23 0621 09/03/23 0200 09/03/23 0242  NA 131*  --  137 138  --  137  --   K 4.4  --  4.2 3.1*  --  3.4*  --   CL 95*  --  104 104  --  100  --   CO2 26  --  22 25  --  24  --   GLUCOSE 106*  --  91 89  --  106*  --   BUN 22  --  16 9  --  7*  --   CREATININE 0.92 1.18* 0.84 0.57  --  0.70  --   CALCIUM 8.5*  --  7.8* 7.6*  --  8.0*  --   MG  --   --  2.3 2.1 2.1  --  1.8  PHOS  --   --   --   --   --   --  2.7     Recent Results (from the past 240 hours)  Resp panel by RT-PCR (RSV, Flu A&B, Covid) Anterior Nasal Swab     Status: None   Collection Time: 08/30/23 10:37 AM   Specimen: Anterior Nasal Swab  Result Value Ref Range Status   SARS Coronavirus 2 by RT PCR NEGATIVE NEGATIVE Final    Comment: (NOTE) SARS-CoV-2  target nucleic acids are NOT DETECTED.  The SARS-CoV-2 RNA is generally detectable in upper respiratory specimens during the acute phase of infection. The lowest concentration of SARS-CoV-2 viral copies this assay can detect is 138 copies/mL. A negative result does not preclude SARS-Cov-2 infection and should not be used as the sole basis for treatment or other patient management decisions. A negative result may occur with  improper specimen collection/handling, submission of specimen other than nasopharyngeal swab, presence of viral mutation(s) within the areas targeted by this assay, and inadequate number of viral copies(<138 copies/mL). A negative result must be combined with clinical observations, patient history, and epidemiological information. The expected result is Negative.  Fact Sheet for Patients:  BloggerCourse.com  Fact Sheet for Healthcare Providers:  SeriousBroker.it  This test is no t yet approved or cleared by the [place] FDA and  has been authorized for detection and/or diagnosis of SARS-CoV-2 by FDA under an Emergency Use Authorization (EUA). This EUA will remain  in effect (meaning this test can be used) for the duration of the COVID-19 declaration under Section 564(b)(1) of the Act, 21 U.S.C.section 360bbb-3(b)(1), unless the authorization is terminated  or revoked sooner.       Influenza A by PCR NEGATIVE NEGATIVE Final   Influenza B by PCR NEGATIVE NEGATIVE Final    Comment: (NOTE) The Xpert Xpress SARS-CoV-2/FLU/RSV plus assay is intended as an aid in the diagnosis of influenza from Nasopharyngeal swab specimens and should not be used as a sole basis for treatment. Nasal washings and aspirates are unacceptable for Xpert Xpress SARS-CoV-2/FLU/RSV testing.  Fact Sheet for Patients: BloggerCourse.com  Fact Sheet for Healthcare  Providers: SeriousBroker.it  This test is not yet approved or cleared by the [place] FDA and has been authorized for detection and/or diagnosis of SARS-CoV-2 by FDA under an Emergency Use Authorization (EUA). This EUA will remain in  effect (meaning this test can be used) for the duration of the COVID-19 declaration under Section 564(b)(1) of the Act, 21 U.S.C. section 360bbb-3(b)(1), unless the authorization is terminated or revoked.     Resp Syncytial Virus by PCR NEGATIVE NEGATIVE Final    Comment: (NOTE) Fact Sheet for Patients: BloggerCourse.com  Fact Sheet for Healthcare Providers: SeriousBroker.it  This test is not yet approved or cleared by the [place] FDA and has been authorized for detection and/or diagnosis of SARS-CoV-2 by FDA under an Emergency Use Authorization (EUA). This EUA will remain in effect (meaning this test can be used) for the duration of the COVID-19 declaration under Section 564(b)(1) of the Act, 21 U.S.C. section 360bbb-3(b)(1), unless the authorization is terminated or revoked.  Performed at [facility], [address] [place], [place] [address]   MRSA Next Gen by PCR, Nasal     Status: None   Collection Time: 08/31/23  4:35 AM   Specimen: Nasal Mucosa; Nasal Swab  Result Value Ref Range Status   MRSA by PCR Next Gen NOT DETECTED NOT DETECTED Final    Comment: (NOTE) The GeneXpert MRSA Assay (FDA approved for NASAL specimens only), is one component of a comprehensive MRSA colonization surveillance program. It is not intended to diagnose MRSA infection nor to guide or monitor treatment for MRSA infections. Test performance is not FDA approved in patients less than 8 years old. Performed at [facility], [address] [place], [place] [address]   Culture, blood (Routine X 2) w Reflex to ID Panel     Status: None (Preliminary  result)   Collection Time: 08/31/23 12:58 PM   Specimen: BLOOD LEFT HAND  Result Value Ref Range Status   Specimen Description BLOOD LEFT HAND  Final   Special Requests   Final    BOTTLES DRAWN AEROBIC AND ANAEROBIC Blood Culture results may not be optimal due to an inadequate volume of blood received in culture bottles   Culture   Final    NO GROWTH 3 DAYS Performed at [facility], [address] [place], [place] [address]    Report Status PENDING  Incomplete  Culture, blood (Routine X 2) w Reflex to ID Panel     Status: None (Preliminary result)   Collection Time: 08/31/23 12:58 PM   Specimen: BLOOD RIGHT HAND  Result Value Ref Range Status   Specimen Description BLOOD RIGHT HAND  Final   Special Requests   Final    BOTTLES DRAWN AEROBIC AND ANAEROBIC Blood Culture results may not be optimal due to an inadequate volume of blood received in culture bottles   Culture   Final    NO GROWTH 3 DAYS Performed at [facility], [address], [place], [place] [address]    Report Status PENDING  Incomplete  Gastrointestinal Panel by PCR , Stool     Status: None   Collection Time: 09/01/23  2:41 AM   Specimen: Stool  Result Value Ref Range Status   Campylobacter species NOT DETECTED NOT DETECTED Final   Plesimonas shigelloides NOT DETECTED NOT DETECTED Final   Salmonella species NOT DETECTED NOT DETECTED Final   Yersinia enterocolitica NOT DETECTED NOT DETECTED Final   Vibrio species NOT DETECTED NOT DETECTED Final   Vibrio cholerae NOT DETECTED NOT DETECTED Final   Enteroaggregative E coli (EAEC) NOT DETECTED NOT DETECTED Final   Enteropathogenic E coli (EPEC) NOT DETECTED NOT DETECTED Final   Enterotoxigenic E coli (ETEC) NOT  DETECTED NOT DETECTED Final   Shiga like toxin producing E coli (STEC) NOT DETECTED NOT DETECTED Final   Shigella/Enteroinvasive E coli (EIEC) NOT DETECTED NOT DETECTED Final   Cryptosporidium NOT DETECTED NOT DETECTED Final   Cyclospora  cayetanensis NOT DETECTED NOT DETECTED Final   Entamoeba histolytica NOT DETECTED NOT DETECTED Final   Giardia lamblia NOT DETECTED NOT DETECTED Final   Adenovirus F40/41 NOT DETECTED NOT DETECTED Final   Astrovirus NOT DETECTED NOT DETECTED Final   Norovirus GI/GII NOT DETECTED NOT DETECTED Final   Rotavirus A NOT DETECTED NOT DETECTED Final   Sapovirus (I, II, IV, and V) NOT DETECTED NOT DETECTED Final    Comment: Performed at [facility], [address] [place], [place] [address]  Respiratory (~20 pathogens) panel by PCR     Status: Abnormal   Collection Time: 09/01/23  2:51 AM   Specimen: Nasopharyngeal Swab; Respiratory  Result Value Ref Range Status   Adenovirus NOT DETECTED NOT DETECTED Final   Coronavirus 229E NOT DETECTED NOT DETECTED Final    Comment: (NOTE) The Coronavirus on the Respiratory Panel, DOES NOT test for the novel  Coronavirus (2019 nCoV)    Coronavirus HKU1 NOT DETECTED NOT DETECTED Final   Coronavirus NL63 NOT DETECTED NOT DETECTED Final   Coronavirus OC43 NOT DETECTED NOT DETECTED Final   Metapneumovirus NOT DETECTED NOT DETECTED Final   Rhinovirus / Enterovirus DETECTED (A) NOT DETECTED Final   Influenza A NOT DETECTED NOT DETECTED Final   Influenza B NOT DETECTED NOT DETECTED Final   Parainfluenza Virus 1 NOT DETECTED NOT DETECTED Final   Parainfluenza Virus 2 NOT DETECTED NOT DETECTED Final   Parainfluenza Virus 3 NOT DETECTED NOT DETECTED Final   Parainfluenza Virus 4 NOT DETECTED NOT DETECTED Final   Respiratory Syncytial Virus NOT DETECTED NOT DETECTED Final   Bordetella pertussis NOT DETECTED NOT DETECTED Final   Bordetella Parapertussis NOT DETECTED NOT DETECTED Final   Chlamydophila pneumoniae NOT DETECTED NOT DETECTED Final   Mycoplasma pneumoniae NOT DETECTED NOT DETECTED Final    Comment: Performed at [facility], [address], [place], [place] [address]     Radiology Studies: DG CHEST PORT 1 VIEW Result Date:  09/03/2023 CLINICAL DATA:  Shortness of breath EXAM: PORTABLE CHEST 1 VIEW COMPARISON:  08/30/2023 FINDINGS: Heart and mediastinal contours within normal limits. Worsening consolidation in the right lower lobe and lingula compatible with pneumonia. Left mid lung nodule again noted, unchanged. No visible effusion or acute bony abnormality. IMPRESSION: Worsening right lower lobe and left perihilar/lingular consolidation concerning for pneumonia. Electronically Signed   By: Charlett Nose M.D.   On: 09/03/2023 00:16     Scheduled Meds:  acetylcysteine  3 mL Nebulization BID   acidophilus  1 capsule Oral Daily   arformoterol  15 mcg Nebulization BID   aspirin  81 mg Oral Daily   budesonide (PULMICORT) nebulizer solution  0.25 mg Nebulization BID   buPROPion  300 mg Oral Daily   cholecalciferol  2,000 Units Oral Daily   donepezil  10 mg Oral QHS   FLUoxetine  20 mg Oral Daily   furosemide  40 mg Intravenous Once   Gerhardt's butt cream   Topical TID   guaiFENesin  600 mg Oral BID   heparin  5,000 Units Subcutaneous Q8H   ipratropium-albuterol  3 mL Nebulization Q6H   losartan  50 mg Oral Daily   methylPREDNISolone (SOLU-MEDROL) injection  40 mg Intravenous Q12H   Followed by   [  START ON 09/04/2023] predniSONE  40 mg Oral Q breakfast   montelukast  10 mg Oral QHS   multivitamin with minerals  1 tablet Oral Daily   pantoprazole  40 mg Oral Daily   polyethylene glycol  17 g Oral Daily   potassium chloride  40 mEq Oral Once   rosuvastatin  10 mg Oral QPM   senna-docusate  1 tablet Oral BID   Continuous Infusions:  piperacillin-tazobactam (ZOSYN)  IV 3.375 g (09/03/23 0558)   [START ON 09/04/2023] vancomycin     Total time spent: 55 minutes   LOS: 4 days   Gillis Santa, MD [facility] P3/30/2025, 11:55 AM

## 2023-09-03 NOTE — Progress Notes (Addendum)
 Patient's RN reported that patient is complaining about shortness of breath O2 sat dropped to 88% on 4 L oxygen which is currently 93% on 4 L now.  Otherwise hemodynamically stable. Patient has been admitted for acute hypoxic respiratory failure in the setting of community-acquired/aspiration pneumonia currently on Unasyn. -Obtaining repeat chest x-ray, continue supplemental oxygen, Mucomyst twice daily, flutter valve, pulmonary toiletry, Xopenex breathing treatment as needed and will follow-up with x-ray result.  Update, Chest x-ray showed worsening right lower lobe and new left perihilar/lingular consolidation concerning for pneumonia. Patient is afebrile currently. Checking CBC and BMP. Respiratory panel positive for rhinovirus. Blood culture from 3/27 so far no growth. Will obtain a sputum culture and check procalcitonin level.  -As chest x-ray showing worsening right lower infiltrate and there is a left sided infiltrate now broadening antibiotic to IV Zosyn and vancomycin.   Tereasa Coop, MD [facility] 09/03/2023, 12:42 AM

## 2023-09-03 NOTE — Progress Notes (Signed)
 Pharmacy Antibiotic Note  Shelly [name] is a 73 y.o. female admitted on 08/30/2023 with N/V and concerns for community acquired/aspiration pneumonia.  Pharmacy has been consulted for Zosyn/vancomycin dosing.  -Previously on Unasyn, but given increased shortness of breath and CXR showing worsening right lower lobe and left perihilar/lingular consolidation vs CXR on 3/26  -WBC WNL, sCr 0.57, afebrile -Resp panel shows positive for Rhinovirus/enterovirus   Plan: -Zosyn 3.375gm IV every 8 hours -Vancomycin 1750mg  IV x1 -Vancomycin 1250mg  IV every 24 hours (AUC 458, Vd 0.72, IBW, sCr 0.8) -Monitor renal function -Follow up signs of clinical improvement, LOT, de-escalation of antibiotics   Height: 5\' 5"  (165.1 cm) Weight: 74.9 kg (165 lb 2 oz) IBW/kg (Calculated) : 57  Temp (24hrs), Avg:98.8 F (37.1 C), Min:98 F (36.7 C), Max:99.6 F (37.6 C)  Recent Labs  Lab 08/30/23 1037 08/30/23 1745 08/31/23 0801 09/01/23 0812  WBC 5.5 4.5 7.4 7.7  CREATININE 0.92 1.18* 0.84 0.57  LATICACIDVEN 1.6  --   --   --     Estimated Creatinine Clearance: 62.5 mL/min (by C-G formula based on SCr of 0.57 mg/dL).    Allergies  Allergen Reactions   Delsym [Dextromethorphan] Other (See Comments)    Cognitive impairment    Antimicrobials this admission: Unasyn 3/26 >> 3/30 Zosyn 3/30 >>  Vancomycin 3/30 >>  Microbiology results: 3/26 BCx: ngtd 3/27 MRSA PCR: negative  Thank you for allowing pharmacy to be a part of this patient's care.  Arabella Merles, PharmD. Clinical Pharmacist 09/03/2023 12:55 AM

## 2023-09-04 ENCOUNTER — Inpatient Hospital Stay (HOSPITAL_COMMUNITY)

## 2023-09-04 DIAGNOSIS — J189 Pneumonia, unspecified organism: Secondary | ICD-10-CM | POA: Diagnosis not present

## 2023-09-04 DIAGNOSIS — J9611 Chronic respiratory failure with hypoxia: Secondary | ICD-10-CM | POA: Insufficient documentation

## 2023-09-04 DIAGNOSIS — J9601 Acute respiratory failure with hypoxia: Secondary | ICD-10-CM | POA: Diagnosis not present

## 2023-09-04 LAB — CBC
HCT: 30 % — ABNORMAL LOW (ref 36.0–46.0)
Hemoglobin: 10 g/dL — ABNORMAL LOW (ref 12.0–15.0)
MCH: 31.4 pg (ref 26.0–34.0)
MCHC: 33.3 g/dL (ref 30.0–36.0)
MCV: 94.3 fL (ref 80.0–100.0)
Platelets: 277 10*3/uL (ref 150–400)
RBC: 3.18 MIL/uL — ABNORMAL LOW (ref 3.87–5.11)
RDW: 13.1 % (ref 11.5–15.5)
WBC: 13.1 10*3/uL — ABNORMAL HIGH (ref 4.0–10.5)
nRBC: 0 % (ref 0.0–0.2)

## 2023-09-04 LAB — BASIC METABOLIC PANEL WITH GFR
Anion gap: 11 (ref 5–15)
BUN: 12 mg/dL (ref 8–23)
CO2: 26 mmol/L (ref 22–32)
Calcium: 7.6 mg/dL — ABNORMAL LOW (ref 8.9–10.3)
Chloride: 101 mmol/L (ref 98–111)
Creatinine, Ser: 0.68 mg/dL (ref 0.44–1.00)
GFR, Estimated: >60 mL/min (ref >60)
Glucose, Bld: 150 mg/dL — ABNORMAL HIGH (ref 70–99)
Potassium: 3.2 mmol/L — ABNORMAL LOW (ref 3.5–5.1)
Sodium: 138 mmol/L (ref 135–145)

## 2023-09-04 LAB — PHOSPHORUS: Phosphorus: 2.9 mg/dL (ref 2.5–4.6)

## 2023-09-04 LAB — MAGNESIUM: Magnesium: 2 mg/dL (ref 1.7–2.4)

## 2023-09-04 LAB — PROCALCITONIN: Procalcitonin: 0.4 ng/mL

## 2023-09-04 MED ORDER — IPRATROPIUM-ALBUTEROL 0.5-2.5 (3) MG/3ML IN SOLN
3.0000 mL | RESPIRATORY_TRACT | Status: DC | PRN
  Administered 2023-09-05: 3 mL via RESPIRATORY_TRACT
  Filled 2023-09-04: qty 3

## 2023-09-04 MED ORDER — POTASSIUM CHLORIDE CRYS ER 20 MEQ PO TBCR
40.0000 meq | EXTENDED_RELEASE_TABLET | ORAL | Status: AC
  Administered 2023-09-04: 40 meq via ORAL
  Filled 2023-09-04: qty 2

## 2023-09-04 MED ORDER — REVEFENACIN 175 MCG/3ML IN SOLN
175.0000 ug | Freq: Every day | RESPIRATORY_TRACT | Status: DC
  Administered 2023-09-05 – 2023-09-06 (×2): 175 ug via RESPIRATORY_TRACT
  Filled 2023-09-04 (×3): qty 3

## 2023-09-04 MED ORDER — AZITHROMYCIN 500 MG PO TABS
500.0000 mg | ORAL_TABLET | Freq: Every day | ORAL | Status: DC
  Administered 2023-09-04 – 2023-09-06 (×3): 500 mg via ORAL
  Filled 2023-09-04 (×3): qty 1

## 2023-09-04 MED ORDER — BUDESONIDE 0.5 MG/2ML IN SUSP
0.5000 mg | Freq: Two times a day (BID) | RESPIRATORY_TRACT | Status: DC
  Administered 2023-09-04 – 2023-09-06 (×5): 0.5 mg via RESPIRATORY_TRACT
  Filled 2023-09-04 (×5): qty 2

## 2023-09-04 NOTE — Progress Notes (Signed)
 Nurse requested Mobility Specialist to perform oxygen saturation test with pt which includes removing pt from oxygen both at rest and while ambulating.  Below are the results from that testing.     Patient Saturations on Room Air at Rest = spO2 85%  Patient Saturations on 10 Liters of oxygen while Ambulating = sp02 87-91%  At end of testing pt left in room on 10  Liters of oxygen.  Reported results to nurse.

## 2023-09-04 NOTE — Progress Notes (Signed)
 Mobility Specialist: Progress Note   09/04/23 1105  Mobility  Activity Ambulated with assistance in hallway  Level of Assistance Contact guard assist, steadying assist  Assistive Device Front wheel walker  Distance Ambulated (ft) 150 ft  Activity Response Tolerated well  Mobility Referral Yes  Mobility visit 1 Mobility  Mobility Specialist Start Time (ACUTE ONLY) 1000  Mobility Specialist Stop Time (ACUTE ONLY) 1020  Mobility Specialist Time Calculation (min) (ACUTE ONLY) 20 min    Pt was agreeable to mobility session - received in bed. Completed ambulatory sat test - see following note. SV for bed mobility, minG for STS, CG for ambulation. Desat to 87-88% on 10LO2 but recovered with standing rest break and cues for PLB. Returned to room without fault. Left in chair with all needs met, call bell in reach. Daughter in room.   Maurene Capes Mobility Specialist Please contact via SecureChat or Rehab office at [phone]

## 2023-09-04 NOTE — Progress Notes (Signed)
 Physical Therapy Treatment Patient Details Name: Shelly [name] MRN: [id number] DOB: 03-07-1951 Today's Date: 09/04/2023   History of Present Illness 73 y.o. female admitted 3/26 with N/V, weakness, falls and found to have community acquired pneumonia. PMhx: Rt BKA, HTN, HLD, mild early Alzheimer's dementia, asthma, fibromyalgia, anxiety.    PT Comments  Pt received in recliner, agreeable to therapy session and with good participation and fair tolerance for transfer and gait training with RW. Pt with increased oxygen requirements this date compared with previous session and c/o fatigue after short household distance gait trial (~85ft) so used RW for energy conservation/safety. Pt performed all activity with 8L HF [place], with SpO2 88-94% with exertion/seated and standing activity. Pt in supine with HOB ~30 degrees at end of session for improved pulmonary clearance, pt agreeable to allow RLE prosthetic to be doffed for residual limb skin check with noted odor, RN notified of odor, prosthesis interior sleeve and pt skin cleansed with soap/water at end of session, VSS on 8L [place]. Pt continues to benefit from PT services to progress toward functional mobility goals, will continue to assess post-acute PT needs pending progress, currently recommend pt continue aggressive mobilization with family or nursing/mobility staff daily.     If plan is discharge home, recommend the following: Assistance with cooking/housework   Can travel by private vehicle        Equipment Recommendations  None recommended by PT    Recommendations for Other Services       Precautions / Restrictions Precautions Precautions: Other (comment) Recall of Precautions/Restrictions: Intact Precaution/Restrictions Comments: watch sats, RLE prosthesis Restrictions Weight Bearing Restrictions Per Provider Order: No     Mobility  Bed Mobility Overal bed mobility: Needs Assistance Bed Mobility: Sit to Supine       Sit to  supine: Supervision   General bed mobility comments: from flat bed wtihout using rail. Cues for body mechanics/removal of prosthetic limb for skin checks, pt agreeable to removal of prosthetic once returned to supine.    Transfers Overall transfer level: Needs assistance Equipment used: Rolling walker (2 wheels) Transfers: Sit to/from Stand Sit to Stand: Supervision           General transfer comment: from recliner chair modI to RW, to/from lower toilet seat with Supervision for better positioning and pt reliant on wall rail, and to EOB modI from RW.    Ambulation/Gait Ambulation/Gait assistance: Supervision Gait Distance (Feet): 80 Feet (3ft to bathroom, then 41ft) Assistive device: Rolling walker (2 wheels) Gait Pattern/deviations: Step-through pattern, Decreased stride length, Trunk flexed Gait velocity: grossly <0.4 m/s     General Gait Details: RW for pt energy conservation given increased O2 needs this date and pt reports she has been using RW more recently. SpO2 88-94% on 8L HF [place] with exertional activity, HR 80's-90's bpm.   Stairs             Wheelchair Mobility     Tilt Bed    Modified Rankin (Stroke Patients Only)       Balance Overall balance assessment: Needs assistance Sitting-balance support: No upper extremity supported Sitting balance-Leahy Scale: Good     Standing balance support: Single extremity supported, During functional activity, Reliant on assistive device for balance, No upper extremity supported Standing balance-Leahy Scale: Fair Standing balance comment: Fair static standing without AD (after toileting/while adjusting gown and briefs, pt requesting to use RW for gait due to fatigue.  Communication Communication Communication: No apparent difficulties  Cognition Arousal: Alert Behavior During Therapy: WFL for tasks assessed/performed   PT - Cognitive impairments: No apparent impairments                        PT - Cognition Comments: Pt reports she hasn't allowed staff to remove her RLE prosthetic "since Monday" although pt has only been in hospital for 5 days. Pt agreeable to allow PTA to help her remove prosthetic for skin check and very strong yeasty smell coming from her RLE residual limb skin and prosthetic, cleansed by PTA. Following commands: Intact      Cueing Cueing Techniques: Verbal cues  Exercises Other Exercises Other Exercises: BLE AROM: quad sets, LAQ x5 reps ea Other Exercises: IS x 5 reps cues for technique and frequency    General Comments General comments (skin integrity, edema, etc.): RLE yeasty smell when prosthetic limb removed after return to bed; pt/RN notified she needs to leave limb off when resting to reduce skin breakdown/moisture associated maceration and allow staff to help her cleanse her skin more often. Neoprene sleeve interior also cleaned with soap-water where it made contact with her skin and left out to dry.      Pertinent Vitals/Pain Pain Assessment Pain Assessment: No/denies pain Pain Intervention(s): Monitored during session, Repositioned    Home Living                          Prior Function            PT Goals (current goals can now be found in the care plan section) Acute Rehab PT Goals Patient Stated Goal: return home PT Goal Formulation: With patient Time For Goal Achievement: 09/14/23 Progress towards PT goals: Progressing toward goals    Frequency    Min 1X/week      PT Plan      Co-evaluation              AM-PAC PT "6 Clicks" Mobility   Outcome Measure  Help needed turning from your back to your side while in a flat bed without using bedrails?: None Help needed moving from lying on your back to sitting on the side of a flat bed without using bedrails?: A Little (without rails) Help needed moving to and from a bed to a chair (including a wheelchair)?: A Little Help needed  standing up from a chair using your arms (e.g., wheelchair or bedside chair)?: A Little (to lower toilet seat) Help needed to walk in hospital room?: A Little Help needed climbing 3-5 steps with a railing? : A Little 6 Click Score: 19    End of Session Equipment Utilized During Treatment: Gait belt;Oxygen (8L HF [place]) Activity Tolerance: Patient tolerated treatment well;Patient limited by fatigue Patient left: in bed;with call bell/phone within reach;with bed alarm set;Other (comment) (pt and daughter both request she be returned to bed to take a nap.) Nurse Communication: Mobility status;Other (comment) (odor coming from R residual limb skin after prosthetic removed) PT Visit Diagnosis: Other abnormalities of gait and mobility (R26.89)     Time: 0981-1914 PT Time Calculation (min) (ACUTE ONLY): 33 min  Charges:    $Gait Training: 8-22 mins $Therapeutic Activity: 8-22 mins PT General Charges $$ ACUTE PT VISIT: 1 Visit                     Eddy Liszewski P., PTA Acute Rehabilitation Services Secure  Chat Preferred 9a-5:30pm Office: [phone]    Shelly [name] 09/04/2023, 2:27 PM

## 2023-09-04 NOTE — Plan of Care (Signed)

## 2023-09-04 NOTE — Progress Notes (Signed)
 PROGRESS NOTE  Shelly [name] ZOX:[id number] DOB: [birth year]   PCP: [name], MD  Patient is from: Home.  Lives alone.  Independently ambulates at baseline.  DOA: 08/30/2023 LOS: 5  Chief complaints Chief Complaint  Patient presents with   Fatigue   Urinary Frequency   Cough     Brief Narrative / Interim history: 73 year old F with PMH of Alzheimer's dementia, RLE AVN s/p BKA, asthma, HTN and HLD presented to ED with fatigue, urinary frequency, cough, nausea, vomiting and abdominal pain, and admitted with working diagnosis of acute respiratory failure with hypoxia due to CAP/possible aspiration pneumonia and possible COPD exacerbation.   In ED, hypoxic requiring 2 L.  Na 131.  Elevated CRP.  COVID-19, influenza and RSV PCR nonreactive.  CXR showed bilateral infiltrate.  CT chest showed RLL pneumonia.  UA did not suggest UTI.  Patient was initially started on CAP coverage with CTX and doxycycline.   RVP positive for rhinovirus.  Urine strep pneumo positive.  Blood cultures NGTD.  Antibiotic escalated to Zosyn and vancomycin on 3/30 due to increased oxygen requirement.  Also started on steroid and breathing treatments for possible COPD exacerbation.  Subjective: Seen and examined earlier this morning.  Desaturated to 85% on RA at rest and required up to 3 L with ambulation.  She was left on 2 L and saturating the low 90s.  She is not in respiratory distress.  She reports nonproductive cough with clear phlegm.  She denies hemoptysis.  Denies chest pain or shortness of breath.  Denies GI or UTI symptoms.  Patient's daughter at bedside.  Objective: Vitals:   09/03/23 2100 09/04/23 0421 09/04/23 0822 09/04/23 0913  BP: 138/79 116/61 132/60   Pulse: 79 87 82   Resp: 19 18 17    Temp: 98 F (36.7 C) 97.9 F (36.6 C)    TempSrc: Oral Oral    SpO2: 95% 93% 91% 91%  Weight:      Height:        Examination:  GENERAL: No apparent distress.  Nontoxic. HEENT: MMM.  Vision and  hearing grossly intact.  NECK: Supple.  No apparent JVD.  RESP:  No IWOB.  Bilateral clavicles, right > left. CVS:  RRR. Heart sounds normal.  ABD/GI/GU: BS+. Abd soft, NTND.  MSK/EXT:  Moves extremities.  Right BKA. SKIN: no apparent skin lesion or wound NEURO: Awake, alert and oriented appropriately.  No apparent focal neuro deficit. PSYCH: Calm. Normal affect.   Consultants:  None  Procedures: None  Microbiology summarized: 3/26-COVID-19, influenza and RSV PCR nonreactive 3/28-RVP positive for rhinovirus 3/27-MRSA PCR screen negative 3/27-blood cultures NGTD 3/28-GI panel negative   Assessment and plan: Acute respiratory failure with hypoxia likely due to community-acquired pneumonia and rhinovirus infection: Infectious workup positive for rhinovirus.  Urine strep pneumo antigen positive.  Also some concern about aspiration given nausea and vomiting on presentation.  CXR concerning for bibasilar infiltrate.  CT abdomen and pelvis concerning for RLL consolidation.  Increased oxygen requirement despite CAP coverage.  Antibiotic escalated to Zosyn and vancomycin on 3/30.  However, MRSA PCR is negative.  Also started on steroid and breathing treatments for possible COPD given significant smoking history.  TTE without significant finding.  Low suspicion for PE.  Procalcitonin is trending down. -Discontinue IV vancomycin -Continue IV Zosyn, prednisone, Pulmicort, Brovana. -And Zithromax for possible atypical coverage -Add Yupelri.  Change DuoNebs to as needed. -Discontinue antitussive.  Continue mucolytics -Encourage incentive telemetry, OOB.  Emphasized the importance of this with  patient, daughter and nurse. -Repeat two-view chest x-ray -Continue PT/OT -Will consult PCCM if no improvement in respiratory status  Alzheimer's dementia without behavioral disturbance: Fairly oriented.  Lives alone.   -Continue delirium precaution, frequent reorientation.   -Minimize narcotics,  sedatives.    Abdominal pain, nausea, vomiting: Likely due to the above.  Resolved.  CT abdomen and pelvis without significant finding.  GIP negative.  -Continue bowel regimen   Mild hyponatremia: Likely from dehydration.  Resolved.   Hypertension: Normotensive. -Continue home Cozaar   Hyperlipidemia:  -Continue home statin  Hypokalemia -Monitor replenish as appropriate  Elevated vitamin D level: No vitamin D on her medication list. -Would advise not to take at home  Debility/deconditioning/right BKA -PT/OT  Body mass index is 27.48 kg/m.          DVT prophylaxis:  heparin injection 5,000 Units Start: 08/30/23 2200  Code Status: Full code Family Communication: Updated patient's daughter at bedside Level of care: Telemetry Medical Status is: Inpatient Remains inpatient appropriate because: Acute respiratory failure with hypoxia, pneumonia   Final disposition: Home   55 minutes with more than 50% spent in reviewing records, counseling patient/family and coordinating care.   Sch Meds:  Scheduled Meds:  acetylcysteine  3 mL Nebulization BID   acidophilus  1 capsule Oral Daily   arformoterol  15 mcg Nebulization BID   aspirin  81 mg Oral Daily   azithromycin  500 mg Oral Daily   budesonide (PULMICORT) nebulizer solution  0.5 mg Nebulization BID   buPROPion  300 mg Oral Daily   cholecalciferol  2,000 Units Oral Daily   donepezil  10 mg Oral QHS   FLUoxetine  20 mg Oral Daily   Gerhardt's butt cream   Topical TID   guaiFENesin  600 mg Oral BID   heparin  5,000 Units Subcutaneous Q8H   losartan  50 mg Oral Daily   montelukast  10 mg Oral QHS   multivitamin with minerals  1 tablet Oral Daily   pantoprazole  40 mg Oral Daily   polyethylene glycol  17 g Oral Daily   potassium chloride  40 mEq Oral Q3H   predniSONE  40 mg Oral Q breakfast   revefenacin  175 mcg Nebulization Daily   rosuvastatin  10 mg Oral QPM   senna-docusate  1 tablet Oral BID   Continuous  Infusions:  piperacillin-tazobactam (ZOSYN)  IV 3.375 g (09/04/23 0532)   PRN Meds:.acetaminophen, bismuth subsalicylate, haloperidol lactate, ipratropium-albuterol, methocarbamol, ondansetron (ZOFRAN) IV, sodium chloride flush, traMADol  Antimicrobials: Anti-infectives (From admission, onward)    Start     Dose/Rate Route Frequency Ordered Stop   09/04/23 1015  azithromycin (ZITHROMAX) tablet 500 mg        500 mg Oral Daily 09/04/23 0921 09/09/23 0959   09/04/23 0500  vancomycin (VANCOREADY) IVPB 1250 mg/250 mL  Status:  Discontinued        1,250 mg 166.7 mL/hr over 90 Minutes Intravenous Every 24 hours 09/03/23 0059 09/04/23 0725   09/03/23 0600  piperacillin-tazobactam (ZOSYN) IVPB 3.375 g        3.375 g 12.5 mL/hr over 240 Minutes Intravenous Every 8 hours 09/03/23 0059     09/03/23 0200  vancomycin (VANCOREADY) IVPB 1750 mg/350 mL        1,750 mg 175 mL/hr over 120 Minutes Intravenous  Once 09/03/23 0059 09/03/23 0515   08/30/23 1830  Ampicillin-Sulbactam (UNASYN) 3 g in sodium chloride 0.9 % 100 mL IVPB  Status:  Discontinued  3 g 200 mL/hr over 30 Minutes Intravenous Every 6 hours 08/30/23 1744 09/03/23 0043   08/30/23 1800  cefTRIAXone (ROCEPHIN) 1 g in sodium chloride 0.9 % 100 mL IVPB  Status:  Discontinued        1 g 200 mL/hr over 30 Minutes Intravenous Every 24 hours 08/30/23 1704 08/30/23 1742   08/30/23 1800  azithromycin (ZITHROMAX) 500 mg in sodium chloride 0.9 % 250 mL IVPB  Status:  Discontinued        500 mg 250 mL/hr over 60 Minutes Intravenous Every 24 hours 08/30/23 1704 08/31/23 0951   08/30/23 1800  doxycycline (VIBRAMYCIN) 100 mg in sodium chloride 0.9 % 250 mL IVPB  Status:  Discontinued        100 mg 125 mL/hr over 120 Minutes Intravenous Every 12 hours 08/30/23 1706 08/30/23 1743   08/30/23 1230  cefTRIAXone (ROCEPHIN) 1 g in sodium chloride 0.9 % 100 mL IVPB        1 g 200 mL/hr over 30 Minutes Intravenous  Once 08/30/23 1220 08/30/23 1329    08/30/23 1230  doxycycline (VIBRAMYCIN) 100 mg in sodium chloride 0.9 % 250 mL IVPB        100 mg 125 mL/hr over 120 Minutes Intravenous  Once 08/30/23 1220 08/30/23 1554        I have personally reviewed the following labs and images: CBC: Recent Labs  Lab 08/30/23 1037 08/30/23 1745 08/31/23 0801 09/01/23 0812 09/03/23 0200 09/04/23 0357  WBC 5.5 4.5 7.4 7.7 11.0* 13.1*  NEUTROABS 4.1  --  6.0  --   --   --   HGB 11.4* 10.1* 10.4* 10.2* 11.0* 10.0*  HCT 34.7* 30.3* 32.1* 31.6* 32.8* 30.0*  MCV 96.1 96.5 97.9 97.5 94.3 94.3  PLT 190 165 179 212 248 277   BMP &GFR Recent Labs  Lab 08/30/23 1037 08/30/23 1745 08/31/23 0801 09/01/23 0812 09/02/23 0621 09/03/23 0200 09/03/23 0242 09/04/23 0357  NA 131*  --  137 138  --  137  --  138  K 4.4  --  4.2 3.1*  --  3.4*  --  3.2*  CL 95*  --  104 104  --  100  --  101  CO2 26  --  22 25  --  24  --  26  GLUCOSE 106*  --  91 89  --  106*  --  150*  BUN 22  --  16 9  --  7*  --  12  CREATININE 0.92 1.18* 0.84 0.57  --  0.70  --  0.68  CALCIUM 8.5*  --  7.8* 7.6*  --  8.0*  --  7.6*  MG  --   --  2.3 2.1 2.1  --  1.8 2.0  PHOS  --   --   --   --   --   --  2.7 2.9   Estimated Creatinine Clearance: 62.5 mL/min (by C-G formula based on SCr of 0.68 mg/dL). Liver & Pancreas: Recent Labs  Lab 08/30/23 1037 09/03/23 0200  AST 58* 53*  ALT 31 46*  ALKPHOS 39 64  BILITOT 0.6 1.2  PROT 6.6 5.9*  ALBUMIN 3.7 2.5*   No results for input(s): "LIPASE", "AMYLASE" in the last 168 hours. No results for input(s): "AMMONIA" in the last 168 hours. Diabetic: No results for input(s): "HGBA1C" in the last 72 hours. Recent Labs  Lab 09/03/23 2026  GLUCAP 138*   Cardiac Enzymes: No results for input(s): "  CKTOTAL", "CKMB", "CKMBINDEX", "TROPONINI" in the last 168 hours. No results for input(s): "PROBNP" in the last 8760 hours. Coagulation Profile: Recent Labs  Lab 08/30/23 1745  INR 1.2   Thyroid Function Tests: No results  for input(s): "TSH", "T4TOTAL", "FREET4", "T3FREE", "THYROIDAB" in the last 72 hours. Lipid Profile: No results for input(s): "CHOL", "HDL", "LDLCALC", "TRIG", "CHOLHDL", "LDLDIRECT" in the last 72 hours. Anemia Panel: No results for input(s): "VITAMINB12", "FOLATE", "FERRITIN", "TIBC", "IRON", "RETICCTPCT" in the last 72 hours. Urine analysis:    Component Value Date/Time   COLORURINE YELLOW 08/31/2023 0425   APPEARANCEUR CLEAR 08/31/2023 0425   LABSPEC >1.046 (H) 08/31/2023 0425   PHURINE 5.0 08/31/2023 0425   GLUCOSEU NEGATIVE 08/31/2023 0425   GLUCOSEU NEGATIVE 09/08/2010 1617   HGBUR NEGATIVE 08/31/2023 0425   BILIRUBINUR NEGATIVE 08/31/2023 0425   KETONESUR 5 (A) 08/31/2023 0425   PROTEINUR 30 (A) 08/31/2023 0425   UROBILINOGEN 0.2 09/08/2010 1617   NITRITE NEGATIVE 08/31/2023 0425   LEUKOCYTESUR NEGATIVE 08/31/2023 0425   Sepsis Labs: Invalid input(s): "PROCALCITONIN", "LACTICIDVEN"  Microbiology: Recent Results (from the past 240 hours)  Resp panel by RT-PCR (RSV, Flu A&B, Covid) Anterior Nasal Swab     Status: None   Collection Time: 08/30/23 10:37 AM   Specimen: Anterior Nasal Swab  Result Value Ref Range Status   SARS Coronavirus 2 by RT PCR NEGATIVE NEGATIVE Final    Comment: (NOTE) SARS-CoV-2 target nucleic acids are NOT DETECTED.  The SARS-CoV-2 RNA is generally detectable in upper respiratory specimens during the acute phase of infection. The lowest concentration of SARS-CoV-2 viral copies this assay can detect is 138 copies/mL. A negative result does not preclude SARS-Cov-2 infection and should not be used as the sole basis for treatment or other patient management decisions. A negative result may occur with  improper specimen collection/handling, submission of specimen other than nasopharyngeal swab, presence of viral mutation(s) within the areas targeted by this assay, and inadequate number of viral copies(<138 copies/mL). A negative result must be  combined with clinical observations, patient history, and epidemiological information. The expected result is Negative.  Fact Sheet for Patients:  BloggerCourse.com  Fact Sheet for Healthcare Providers:  SeriousBroker.it  This test is no t yet approved or cleared by the [place] FDA and  has been authorized for detection and/or diagnosis of SARS-CoV-2 by FDA under an Emergency Use Authorization (EUA). This EUA will remain  in effect (meaning this test can be used) for the duration of the COVID-19 declaration under Section 564(b)(1) of the Act, 21 U.S.C.section 360bbb-3(b)(1), unless the authorization is terminated  or revoked sooner.       Influenza A by PCR NEGATIVE NEGATIVE Final   Influenza B by PCR NEGATIVE NEGATIVE Final    Comment: (NOTE) The Xpert Xpress SARS-CoV-2/FLU/RSV plus assay is intended as an aid in the diagnosis of influenza from Nasopharyngeal swab specimens and should not be used as a sole basis for treatment. Nasal washings and aspirates are unacceptable for Xpert Xpress SARS-CoV-2/FLU/RSV testing.  Fact Sheet for Patients: BloggerCourse.com  Fact Sheet for Healthcare Providers: SeriousBroker.it  This test is not yet approved or cleared by the [place] FDA and has been authorized for detection and/or diagnosis of SARS-CoV-2 by FDA under an Emergency Use Authorization (EUA). This EUA will remain in effect (meaning this test can be used) for the duration of the COVID-19 declaration under Section 564(b)(1) of the Act, 21 U.S.C. section 360bbb-3(b)(1), unless the authorization is terminated or revoked.  Resp Syncytial Virus by PCR NEGATIVE NEGATIVE Final    Comment: (NOTE) Fact Sheet for Patients: BloggerCourse.com  Fact Sheet for Healthcare Providers: SeriousBroker.it  This test is not yet  approved or cleared by the [place] FDA and has been authorized for detection and/or diagnosis of SARS-CoV-2 by FDA under an Emergency Use Authorization (EUA). This EUA will remain in effect (meaning this test can be used) for the duration of the COVID-19 declaration under Section 564(b)(1) of the Act, 21 U.S.C. section 360bbb-3(b)(1), unless the authorization is terminated or revoked.  Performed at [facility], [address] [place], [place] [address]   MRSA Next Gen by PCR, Nasal     Status: None   Collection Time: 08/31/23  4:35 AM   Specimen: Nasal Mucosa; Nasal Swab  Result Value Ref Range Status   MRSA by PCR Next Gen NOT DETECTED NOT DETECTED Final    Comment: (NOTE) The GeneXpert MRSA Assay (FDA approved for NASAL specimens only), is one component of a comprehensive MRSA colonization surveillance program. It is not intended to diagnose MRSA infection nor to guide or monitor treatment for MRSA infections. Test performance is not FDA approved in patients less than 71 years old. Performed at [facility], [address], [place], [place] [address]   Culture, blood (Routine X 2) w Reflex to ID Panel     Status: None (Preliminary result)   Collection Time: 08/31/23 12:58 PM   Specimen: BLOOD LEFT HAND  Result Value Ref Range Status   Specimen Description BLOOD LEFT HAND  Final   Special Requests   Final    BOTTLES DRAWN AEROBIC AND ANAEROBIC Blood Culture results may not be optimal due to an inadequate volume of blood received in culture bottles   Culture   Final    NO GROWTH 4 DAYS Performed at [facility], [address], [place], [place] [address]    Report Status PENDING  Incomplete  Culture, blood (Routine X 2) w Reflex to ID Panel     Status: None (Preliminary result)   Collection Time: 08/31/23 12:58 PM   Specimen: BLOOD RIGHT HAND  Result Value Ref Range Status   Specimen Description BLOOD RIGHT HAND  Final   Special  Requests   Final    BOTTLES DRAWN AEROBIC AND ANAEROBIC Blood Culture results may not be optimal due to an inadequate volume of blood received in culture bottles   Culture   Final    NO GROWTH 4 DAYS Performed at [facility], [address], [place], [place] [address]    Report Status PENDING  Incomplete  Gastrointestinal Panel by PCR , Stool     Status: None   Collection Time: 09/01/23  2:41 AM   Specimen: Stool  Result Value Ref Range Status   Campylobacter species NOT DETECTED NOT DETECTED Final   Plesimonas shigelloides NOT DETECTED NOT DETECTED Final   Salmonella species NOT DETECTED NOT DETECTED Final   Yersinia enterocolitica NOT DETECTED NOT DETECTED Final   Vibrio species NOT DETECTED NOT DETECTED Final   Vibrio cholerae NOT DETECTED NOT DETECTED Final   Enteroaggregative E coli (EAEC) NOT DETECTED NOT DETECTED Final   Enteropathogenic E coli (EPEC) NOT DETECTED NOT DETECTED Final   Enterotoxigenic E coli (ETEC) NOT DETECTED NOT DETECTED Final   Shiga like toxin producing E coli (STEC) NOT DETECTED NOT DETECTED Final   Shigella/Enteroinvasive E coli (EIEC) NOT DETECTED NOT DETECTED Final   Cryptosporidium NOT DETECTED  NOT DETECTED Final   Cyclospora cayetanensis NOT DETECTED NOT DETECTED Final   Entamoeba histolytica NOT DETECTED NOT DETECTED Final   Giardia lamblia NOT DETECTED NOT DETECTED Final   Adenovirus F40/41 NOT DETECTED NOT DETECTED Final   Astrovirus NOT DETECTED NOT DETECTED Final   Norovirus GI/GII NOT DETECTED NOT DETECTED Final   Rotavirus A NOT DETECTED NOT DETECTED Final   Sapovirus (I, II, IV, and V) NOT DETECTED NOT DETECTED Final    Comment: Performed at [facility], [address] [place], [place] [address]  Respiratory (~20 pathogens) panel by PCR     Status: Abnormal   Collection Time: 09/01/23  2:51 AM   Specimen: Nasopharyngeal Swab; Respiratory  Result Value Ref Range Status   Adenovirus NOT DETECTED NOT DETECTED Final    Coronavirus 229E NOT DETECTED NOT DETECTED Final    Comment: (NOTE) The Coronavirus on the Respiratory Panel, DOES NOT test for the novel  Coronavirus (2019 nCoV)    Coronavirus HKU1 NOT DETECTED NOT DETECTED Final   Coronavirus NL63 NOT DETECTED NOT DETECTED Final   Coronavirus OC43 NOT DETECTED NOT DETECTED Final   Metapneumovirus NOT DETECTED NOT DETECTED Final   Rhinovirus / Enterovirus DETECTED (A) NOT DETECTED Final   Influenza A NOT DETECTED NOT DETECTED Final   Influenza B NOT DETECTED NOT DETECTED Final   Parainfluenza Virus 1 NOT DETECTED NOT DETECTED Final   Parainfluenza Virus 2 NOT DETECTED NOT DETECTED Final   Parainfluenza Virus 3 NOT DETECTED NOT DETECTED Final   Parainfluenza Virus 4 NOT DETECTED NOT DETECTED Final   Respiratory Syncytial Virus NOT DETECTED NOT DETECTED Final   Bordetella pertussis NOT DETECTED NOT DETECTED Final   Bordetella Parapertussis NOT DETECTED NOT DETECTED Final   Chlamydophila pneumoniae NOT DETECTED NOT DETECTED Final   Mycoplasma pneumoniae NOT DETECTED NOT DETECTED Final    Comment: Performed at [facility], [address] [place], [place] [address]    Radiology Studies: No results found.    Minnetta Sandora T. Babita Amaker [facility]  If 7PM-7AM, please contact night-coverage [email or web address] 09/04/2023, 12:31 PM

## 2023-09-05 ENCOUNTER — Encounter: Admitting: Physical Therapy

## 2023-09-05 ENCOUNTER — Telehealth: Payer: Self-pay | Admitting: Neurology

## 2023-09-05 DIAGNOSIS — J189 Pneumonia, unspecified organism: Secondary | ICD-10-CM | POA: Diagnosis not present

## 2023-09-05 DIAGNOSIS — J9601 Acute respiratory failure with hypoxia: Secondary | ICD-10-CM | POA: Diagnosis not present

## 2023-09-05 LAB — CBC
HCT: 28.4 % — ABNORMAL LOW (ref 36.0–46.0)
Hemoglobin: 9.4 g/dL — ABNORMAL LOW (ref 12.0–15.0)
MCH: 31.6 pg (ref 26.0–34.0)
MCHC: 33.1 g/dL (ref 30.0–36.0)
MCV: 95.6 fL (ref 80.0–100.0)
Platelets: 289 10*3/uL (ref 150–400)
RBC: 2.97 MIL/uL — ABNORMAL LOW (ref 3.87–5.11)
RDW: 13.4 % (ref 11.5–15.5)
WBC: 16 10*3/uL — ABNORMAL HIGH (ref 4.0–10.5)
nRBC: 0.1 % (ref 0.0–0.2)

## 2023-09-05 LAB — PROCALCITONIN: Procalcitonin: 0.24 ng/mL

## 2023-09-05 LAB — RENAL FUNCTION PANEL
Albumin: 2.1 g/dL — ABNORMAL LOW (ref 3.5–5.0)
Anion gap: 9 (ref 5–15)
BUN: 15 mg/dL (ref 8–23)
CO2: 27 mmol/L (ref 22–32)
Calcium: 7.8 mg/dL — ABNORMAL LOW (ref 8.9–10.3)
Chloride: 104 mmol/L (ref 98–111)
Creatinine, Ser: 0.66 mg/dL (ref 0.44–1.00)
GFR, Estimated: >60 mL/min (ref >60)
Glucose, Bld: 117 mg/dL — ABNORMAL HIGH (ref 70–99)
Phosphorus: 2.3 mg/dL — ABNORMAL LOW (ref 2.5–4.6)
Potassium: 3.2 mmol/L — ABNORMAL LOW (ref 3.5–5.1)
Sodium: 140 mmol/L (ref 135–145)

## 2023-09-05 LAB — MAGNESIUM: Magnesium: 2.2 mg/dL (ref 1.7–2.4)

## 2023-09-05 LAB — CULTURE, BLOOD (ROUTINE X 2)
Culture: NO GROWTH
Culture: NO GROWTH

## 2023-09-05 LAB — C-REACTIVE PROTEIN: CRP: 8 mg/dL — ABNORMAL HIGH (ref <1.0)

## 2023-09-05 LAB — SEDIMENTATION RATE: Sed Rate: 68 mm/h — ABNORMAL HIGH (ref 0–22)

## 2023-09-05 MED ORDER — TRAZODONE HCL 50 MG PO TABS
50.0000 mg | ORAL_TABLET | Freq: Every evening | ORAL | Status: DC | PRN
  Administered 2023-09-05: 50 mg via ORAL
  Filled 2023-09-05: qty 1

## 2023-09-05 MED ORDER — POTASSIUM CHLORIDE CRYS ER 20 MEQ PO TBCR
40.0000 meq | EXTENDED_RELEASE_TABLET | ORAL | Status: AC
  Administered 2023-09-05 (×2): 40 meq via ORAL
  Filled 2023-09-05 (×2): qty 2

## 2023-09-05 NOTE — Progress Notes (Signed)
 Mobility Specialist: Progress Note   09/05/23 1600  Mobility  Activity Ambulated with assistance in hallway  Level of Assistance Standby assist, set-up cues, supervision of patient - no hands on  Assistive Device Front wheel walker  Distance Ambulated (ft) 225 ft  Activity Response Tolerated well  Mobility Referral Yes  Mobility visit 1 Mobility  Mobility Specialist Start Time (ACUTE ONLY) 1535  Mobility Specialist Stop Time (ACUTE ONLY) 1600  Mobility Specialist Time Calculation (min) (ACUTE ONLY) 25 min    Pre Mobility: SpO2 95% 4L HF O2 During Mobility: SpO2 83-92% 4L HF O2 Post Mobility: SpO2 94% 3L HF O2  Pt was agreeable to mobility session - received in chair. SV throughout. Desat 2x to SpO2 83-85% on 4LO2 but recovered quickly to SpO2 92% with standing break and cues for PLB. Returned to room without fault. Once in room, RN requested pt be weaned down to 3L HF O2. Pt sat on SpO2 94% 3L HF O2. Left in bed with all needs met, call bell in reach.   Maurene Capes Mobility Specialist Please contact via SecureChat or Rehab office at [phone]

## 2023-09-05 NOTE — Telephone Encounter (Signed)
 I called the patient's daughter, Nolberto Hanlon.  We reviewed positive ATN profile.  Patient is still admitted for pneumonia.  She will be going home with home health.  For now, we will put on hold any further workup.  She will continue on Aricept, reach out to me in the future and we can start Namenda.  They will do research on [place], if interested will need amyloid PET and MRI.

## 2023-09-05 NOTE — Progress Notes (Signed)
 Transition of Care [facility]) - Inpatient Brief Assessment   Patient Details  Name: Shelly [name] MRN: [id number] Date of Birth: 1951-03-19  Transition of Care [facility]) CM/SW Contact:    Janae Bridgeman, RN Phone Number: 09/05/2023, 4:03 PM   Clinical Narrative: CM met with the patient at the bedside and patient remains on 4L/min [place] oxygen.  Patient should likely discharge to home when medically stable.  I called and spoke with daughter, Nolberto Hanlon on the phone and she plans to visit with the patient at the home for a few days when patient is discharge.  Patient lives at home alone.  DME at the home includes right leg prosthesis, WC.  [employer] delivered portable oxygen tank and nebulizer machine to the bedside.  The patient will need home health teaching and support since she lives alone.  Medicare choice was offered to the patient regarding home health and she did not have a preference.  I called [facility] and they plan to provide [facility] RN.  No other TOc needs at this time.   Transition of Care Asessment: Insurance and Status: (P) Insurance coverage has been reviewed Patient has primary care physician: (P) Yes Home environment has been reviewed: (P) from home alone Prior level of function:: (P) Independent Prior/Current Home Services: (P) No current home services Social Drivers of Health Review: (P) SDOH reviewed interventions complete Readmission risk has been reviewed: (P) Yes Transition of care needs: (P) transition of care needs identified, TOC will continue to follow

## 2023-09-05 NOTE — Progress Notes (Signed)
 Alanda Slim, MD notified me in regard to weaning down supplemental oxygen. Supplemental O2 weaned from 5 Liters to 4 Liters. Pt maintained saturation of 96% on 4 Liters. Pt continues to be on 4 Liters at this time.

## 2023-09-05 NOTE — Progress Notes (Signed)
 PROGRESS NOTE  Shelly [name] ZOX:[id number] DOB: 1950-07-15   PCP: [name], MD  Patient is from: Home.  Lives alone.  Independently ambulates at baseline.  DOA: 08/30/2023 LOS: 6  Chief complaints Chief Complaint  Patient presents with   Fatigue   Urinary Frequency   Cough     Brief Narrative / Interim history: 73 year old F with PMH of Alzheimer's dementia, RLE AVN s/p BKA, asthma, HTN and HLD presented to ED with fatigue, urinary frequency, cough, nausea, vomiting and abdominal pain, and admitted with working diagnosis of acute respiratory failure with hypoxia due to CAP/possible aspiration pneumonia and possible COPD exacerbation.   In ED, hypoxic requiring 2 L.  Na 131.  Elevated CRP.  COVID-19, influenza and RSV PCR nonreactive.  CXR showed bilateral infiltrate.  CT chest showed RLL pneumonia.  UA did not suggest UTI.  Patient was initially started on CAP coverage with CTX and doxycycline.   RVP positive for rhinovirus.  Urine strep pneumo positive.  Blood cultures NGTD.  Antibiotic escalated to Zosyn and vancomycin on 3/30 due to increased oxygen requirement.  Also started on steroid and breathing treatments for possible COPD exacerbation.  Subjective: Seen and examined earlier this morning.  No major events overnight of this morning.  Reports feeling better.  Continues to require 5 L by nasal cannula.  Patient's daughter at bedside.  Objective: Vitals:   09/05/23 0505 09/05/23 0748 09/05/23 0900 09/05/23 1113  BP: 133/67  (!) 130/49   Pulse: 81 89  77  Resp: 18 20 20 16   Temp: 98 F (36.7 C)  (!) 97.2 F (36.2 C)   TempSrc: Oral  Oral   SpO2: 93% 92%  94%  Weight:      Height:        Examination:  GENERAL: No apparent distress.  Nontoxic. HEENT: MMM.  Vision and hearing grossly intact.  NECK: Supple.  No apparent JVD.  RESP:  No IWOB.  Bilateral clavicles, right > left.  500 cc on incentive spirometry CVS:  RRR. Heart sounds normal.  ABD/GI/GU: BS+.  Abd soft, NTND.  MSK/EXT:  Moves extremities.  Right BKA. SKIN: no apparent skin lesion or wound NEURO: Awake, alert and oriented appropriately.  No apparent focal neuro deficit. PSYCH: Calm. Normal affect.   Consultants:  None  Procedures: None  Microbiology summarized: 3/26-COVID-19, influenza and RSV PCR nonreactive 3/28-RVP positive for rhinovirus 3/27-MRSA PCR screen negative 3/27-blood cultures NGTD 3/28-GI panel negative   Assessment and plan: Acute respiratory failure with hypoxia likely due to community-acquired pneumonia and rhinovirus infection: Infectious workup positive for rhinovirus.  Urine strep pneumo antigen positive.  Also some concern about aspiration given nausea and vomiting on presentation.  CXR concerning for bibasilar infiltrate.  CT A/P concerning for RLL consolidation.  Increased oxygen requirement despite CAP coverage.  Antibiotic escalated to Zosyn and vancomycin on 3/30.  However, MRSA PCR is negative.  Also started on steroid and breathing treatments.  TTE without significant finding.  Low suspicion for PE.  Pro-Cal trending down. -Continue IV Zosyn, prednisone, Pulmicort, Brovana and Yupelri. -And Zithromax on 3/31 for possible atypical coverage -Continue DuoNebs and mucolytic's as needed -Encourage IS, OOB.  -Continue PT/OT -Needs outpatient PFT  Alzheimer's dementia without behavioral disturbance: Fairly oriented.  Lives alone.   -Continue delirium precaution, frequent reorientation.   -Minimize narcotics, sedatives.    Abdominal pain, nausea, vomiting: Likely due to the above.  Resolved.  CT abdomen and pelvis without significant finding.  GIP negative.  -Continue  bowel regimen   Mild hyponatremia: Likely from dehydration.  Resolved.   Hypertension: Normotensive. -Continue home Cozaar   Hyperlipidemia:  -Continue home statin  Hypokalemia -Monitor replenish as appropriate  Elevated vitamin D level: No vitamin D on her medication  list. -Would advise not to take at home  Debility/deconditioning/right BKA -PT/OT  Body mass index is 27.48 kg/m.          DVT prophylaxis:  heparin injection 5,000 Units Start: 08/30/23 2200  Code Status: Full code Family Communication: Updated patient's daughter at bedside Level of care: Telemetry Medical Status is: Inpatient Remains inpatient appropriate because: Acute respiratory failure with hypoxia, pneumonia   Final disposition: Home   55 minutes with more than 50% spent in reviewing records, counseling patient/family and coordinating care.   Sch Meds:  Scheduled Meds:  acetylcysteine  3 mL Nebulization BID   acidophilus  1 capsule Oral Daily   arformoterol  15 mcg Nebulization BID   aspirin  81 mg Oral Daily   azithromycin  500 mg Oral Daily   budesonide (PULMICORT) nebulizer solution  0.5 mg Nebulization BID   buPROPion  300 mg Oral Daily   cholecalciferol  2,000 Units Oral Daily   donepezil  10 mg Oral QHS   FLUoxetine  20 mg Oral Daily   Gerhardt's butt cream   Topical TID   guaiFENesin  600 mg Oral BID   heparin  5,000 Units Subcutaneous Q8H   losartan  50 mg Oral Daily   montelukast  10 mg Oral QHS   multivitamin with minerals  1 tablet Oral Daily   pantoprazole  40 mg Oral Daily   polyethylene glycol  17 g Oral Daily   predniSONE  40 mg Oral Q breakfast   revefenacin  175 mcg Nebulization Daily   rosuvastatin  10 mg Oral QPM   senna-docusate  1 tablet Oral BID   Continuous Infusions:  piperacillin-tazobactam (ZOSYN)  IV 3.375 g (09/05/23 1241)   PRN Meds:.acetaminophen, bismuth subsalicylate, haloperidol lactate, ipratropium-albuterol, methocarbamol, ondansetron (ZOFRAN) IV, sodium chloride flush, traMADol  Antimicrobials: Anti-infectives (From admission, onward)    Start     Dose/Rate Route Frequency Ordered Stop   09/04/23 1015  azithromycin (ZITHROMAX) tablet 500 mg        500 mg Oral Daily 09/04/23 0921 09/09/23 0959   09/04/23 0500   vancomycin (VANCOREADY) IVPB 1250 mg/250 mL  Status:  Discontinued        1,250 mg 166.7 mL/hr over 90 Minutes Intravenous Every 24 hours 09/03/23 0059 09/04/23 0725   09/03/23 0600  piperacillin-tazobactam (ZOSYN) IVPB 3.375 g        3.375 g 12.5 mL/hr over 240 Minutes Intravenous Every 8 hours 09/03/23 0059     09/03/23 0200  vancomycin (VANCOREADY) IVPB 1750 mg/350 mL        1,750 mg 175 mL/hr over 120 Minutes Intravenous  Once 09/03/23 0059 09/03/23 0515   08/30/23 1830  Ampicillin-Sulbactam (UNASYN) 3 g in sodium chloride 0.9 % 100 mL IVPB  Status:  Discontinued        3 g 200 mL/hr over 30 Minutes Intravenous Every 6 hours 08/30/23 1744 09/03/23 0043   08/30/23 1800  cefTRIAXone (ROCEPHIN) 1 g in sodium chloride 0.9 % 100 mL IVPB  Status:  Discontinued        1 g 200 mL/hr over 30 Minutes Intravenous Every 24 hours 08/30/23 1704 08/30/23 1742   08/30/23 1800  azithromycin (ZITHROMAX) 500 mg in sodium chloride 0.9 % 250  mL IVPB  Status:  Discontinued        500 mg 250 mL/hr over 60 Minutes Intravenous Every 24 hours 08/30/23 1704 08/31/23 0951   08/30/23 1800  doxycycline (VIBRAMYCIN) 100 mg in sodium chloride 0.9 % 250 mL IVPB  Status:  Discontinued        100 mg 125 mL/hr over 120 Minutes Intravenous Every 12 hours 08/30/23 1706 08/30/23 1743   08/30/23 1230  cefTRIAXone (ROCEPHIN) 1 g in sodium chloride 0.9 % 100 mL IVPB        1 g 200 mL/hr over 30 Minutes Intravenous  Once 08/30/23 1220 08/30/23 1329   08/30/23 1230  doxycycline (VIBRAMYCIN) 100 mg in sodium chloride 0.9 % 250 mL IVPB        100 mg 125 mL/hr over 120 Minutes Intravenous  Once 08/30/23 1220 08/30/23 1554        I have personally reviewed the following labs and images: CBC: Recent Labs  Lab 08/30/23 1037 08/30/23 1745 08/31/23 0801 09/01/23 0812 09/03/23 0200 09/04/23 0357 09/05/23 0330  WBC 5.5   < > 7.4 7.7 11.0* 13.1* 16.0*  NEUTROABS 4.1  --  6.0  --   --   --   --   HGB 11.4*   < > 10.4*  10.2* 11.0* 10.0* 9.4*  HCT 34.7*   < > 32.1* 31.6* 32.8* 30.0* 28.4*  MCV 96.1   < > 97.9 97.5 94.3 94.3 95.6  PLT 190   < > 179 212 248 277 289   < > = values in this interval not displayed.   BMP &GFR Recent Labs  Lab 08/31/23 0801 09/01/23 0812 09/02/23 0621 09/03/23 0200 09/03/23 0242 09/04/23 0357 09/05/23 0330  NA 137 138  --  137  --  138 140  K 4.2 3.1*  --  3.4*  --  3.2* 3.2*  CL 104 104  --  100  --  101 104  CO2 22 25  --  24  --  26 27  GLUCOSE 91 89  --  106*  --  150* 117*  BUN 16 9  --  7*  --  12 15  CREATININE 0.84 0.57  --  0.70  --  0.68 0.66  CALCIUM 7.8* 7.6*  --  8.0*  --  7.6* 7.8*  MG 2.3 2.1 2.1  --  1.8 2.0 2.2  PHOS  --   --   --   --  2.7 2.9 2.3*   Estimated Creatinine Clearance: 62.5 mL/min (by C-G formula based on SCr of 0.66 mg/dL). Liver & Pancreas: Recent Labs  Lab 08/30/23 1037 09/03/23 0200 09/05/23 0330  AST 58* 53*  --   ALT 31 46*  --   ALKPHOS 39 64  --   BILITOT 0.6 1.2  --   PROT 6.6 5.9*  --   ALBUMIN 3.7 2.5* 2.1*   No results for input(s): "LIPASE", "AMYLASE" in the last 168 hours. No results for input(s): "AMMONIA" in the last 168 hours. Diabetic: No results for input(s): "HGBA1C" in the last 72 hours. Recent Labs  Lab 09/03/23 2026  GLUCAP 138*   Cardiac Enzymes: No results for input(s): "CKTOTAL", "CKMB", "CKMBINDEX", "TROPONINI" in the last 168 hours. No results for input(s): "PROBNP" in the last 8760 hours. Coagulation Profile: Recent Labs  Lab 08/30/23 1745  INR 1.2   Thyroid Function Tests: No results for input(s): "TSH", "T4TOTAL", "FREET4", "T3FREE", "THYROIDAB" in the last 72 hours. Lipid Profile: No results  for input(s): "CHOL", "HDL", "LDLCALC", "TRIG", "CHOLHDL", "LDLDIRECT" in the last 72 hours. Anemia Panel: No results for input(s): "VITAMINB12", "FOLATE", "FERRITIN", "TIBC", "IRON", "RETICCTPCT" in the last 72 hours. Urine analysis:    Component Value Date/Time   COLORURINE YELLOW  08/31/2023 0425   APPEARANCEUR CLEAR 08/31/2023 0425   LABSPEC >1.046 (H) 08/31/2023 0425   PHURINE 5.0 08/31/2023 0425   GLUCOSEU NEGATIVE 08/31/2023 0425   GLUCOSEU NEGATIVE 09/08/2010 1617   HGBUR NEGATIVE 08/31/2023 0425   BILIRUBINUR NEGATIVE 08/31/2023 0425   KETONESUR 5 (A) 08/31/2023 0425   PROTEINUR 30 (A) 08/31/2023 0425   UROBILINOGEN 0.2 09/08/2010 1617   NITRITE NEGATIVE 08/31/2023 0425   LEUKOCYTESUR NEGATIVE 08/31/2023 0425   Sepsis Labs: Invalid input(s): "PROCALCITONIN", "LACTICIDVEN"  Microbiology: Recent Results (from the past 240 hours)  Resp panel by RT-PCR (RSV, Flu A&B, Covid) Anterior Nasal Swab     Status: None   Collection Time: 08/30/23 10:37 AM   Specimen: Anterior Nasal Swab  Result Value Ref Range Status   SARS Coronavirus 2 by RT PCR NEGATIVE NEGATIVE Final    Comment: (NOTE) SARS-CoV-2 target nucleic acids are NOT DETECTED.  The SARS-CoV-2 RNA is generally detectable in upper respiratory specimens during the acute phase of infection. The lowest concentration of SARS-CoV-2 viral copies this assay can detect is 138 copies/mL. A negative result does not preclude SARS-Cov-2 infection and should not be used as the sole basis for treatment or other patient management decisions. A negative result may occur with  improper specimen collection/handling, submission of specimen other than nasopharyngeal swab, presence of viral mutation(s) within the areas targeted by this assay, and inadequate number of viral copies(<138 copies/mL). A negative result must be combined with clinical observations, patient history, and epidemiological information. The expected result is Negative.  Fact Sheet for Patients:  BloggerCourse.com  Fact Sheet for Healthcare Providers:  SeriousBroker.it  This test is no t yet approved or cleared by the [place] FDA and  has been authorized for detection and/or diagnosis of  SARS-CoV-2 by FDA under an Emergency Use Authorization (EUA). This EUA will remain  in effect (meaning this test can be used) for the duration of the COVID-19 declaration under Section 564(b)(1) of the Act, 21 U.S.C.section 360bbb-3(b)(1), unless the authorization is terminated  or revoked sooner.       Influenza A by PCR NEGATIVE NEGATIVE Final   Influenza B by PCR NEGATIVE NEGATIVE Final    Comment: (NOTE) The Xpert Xpress SARS-CoV-2/FLU/RSV plus assay is intended as an aid in the diagnosis of influenza from Nasopharyngeal swab specimens and should not be used as a sole basis for treatment. Nasal washings and aspirates are unacceptable for Xpert Xpress SARS-CoV-2/FLU/RSV testing.  Fact Sheet for Patients: BloggerCourse.com  Fact Sheet for Healthcare Providers: SeriousBroker.it  This test is not yet approved or cleared by the [place] FDA and has been authorized for detection and/or diagnosis of SARS-CoV-2 by FDA under an Emergency Use Authorization (EUA). This EUA will remain in effect (meaning this test can be used) for the duration of the COVID-19 declaration under Section 564(b)(1) of the Act, 21 U.S.C. section 360bbb-3(b)(1), unless the authorization is terminated or revoked.     Resp Syncytial Virus by PCR NEGATIVE NEGATIVE Final    Comment: (NOTE) Fact Sheet for Patients: BloggerCourse.com  Fact Sheet for Healthcare Providers: SeriousBroker.it  This test is not yet approved or cleared by the [place] FDA and has been authorized for detection and/or diagnosis of SARS-CoV-2 by FDA under  an Emergency Use Authorization (EUA). This EUA will remain in effect (meaning this test can be used) for the duration of the COVID-19 declaration under Section 564(b)(1) of the Act, 21 U.S.C. section 360bbb-3(b)(1), unless the authorization is terminated  or revoked.  Performed at [facility], [address] [place], [place] [address]   MRSA Next Gen by PCR, Nasal     Status: None   Collection Time: 08/31/23  4:35 AM   Specimen: Nasal Mucosa; Nasal Swab  Result Value Ref Range Status   MRSA by PCR Next Gen NOT DETECTED NOT DETECTED Final    Comment: (NOTE) The GeneXpert MRSA Assay (FDA approved for NASAL specimens only), is one component of a comprehensive MRSA colonization surveillance program. It is not intended to diagnose MRSA infection nor to guide or monitor treatment for MRSA infections. Test performance is not FDA approved in patients less than 80 years old. Performed at [facility], [address], [place], [place] [address]   Culture, blood (Routine X 2) w Reflex to ID Panel     Status: None   Collection Time: 08/31/23 12:58 PM   Specimen: BLOOD LEFT HAND  Result Value Ref Range Status   Specimen Description BLOOD LEFT HAND  Final   Special Requests   Final    BOTTLES DRAWN AEROBIC AND ANAEROBIC Blood Culture results may not be optimal due to an inadequate volume of blood received in culture bottles   Culture   Final    NO GROWTH 5 DAYS Performed at [facility], [address], [place], [place] [address]    Report Status 09/05/2023 FINAL  Final  Culture, blood (Routine X 2) w Reflex to ID Panel     Status: None   Collection Time: 08/31/23 12:58 PM   Specimen: BLOOD RIGHT HAND  Result Value Ref Range Status   Specimen Description BLOOD RIGHT HAND  Final   Special Requests   Final    BOTTLES DRAWN AEROBIC AND ANAEROBIC Blood Culture results may not be optimal due to an inadequate volume of blood received in culture bottles   Culture   Final    NO GROWTH 5 DAYS Performed at [facility], [address], [place], [place] [address]    Report Status 09/05/2023 FINAL  Final  Gastrointestinal Panel by PCR , Stool     Status: None   Collection Time: 09/01/23  2:41 AM   Specimen:  Stool  Result Value Ref Range Status   Campylobacter species NOT DETECTED NOT DETECTED Final   Plesimonas shigelloides NOT DETECTED NOT DETECTED Final   Salmonella species NOT DETECTED NOT DETECTED Final   Yersinia enterocolitica NOT DETECTED NOT DETECTED Final   Vibrio species NOT DETECTED NOT DETECTED Final   Vibrio cholerae NOT DETECTED NOT DETECTED Final   Enteroaggregative E coli (EAEC) NOT DETECTED NOT DETECTED Final   Enteropathogenic E coli (EPEC) NOT DETECTED NOT DETECTED Final   Enterotoxigenic E coli (ETEC) NOT DETECTED NOT DETECTED Final   Shiga like toxin producing E coli (STEC) NOT DETECTED NOT DETECTED Final   Shigella/Enteroinvasive E coli (EIEC) NOT DETECTED NOT DETECTED Final   Cryptosporidium NOT DETECTED NOT DETECTED Final   Cyclospora cayetanensis NOT DETECTED NOT DETECTED Final   Entamoeba histolytica NOT DETECTED NOT DETECTED Final   Giardia lamblia NOT DETECTED NOT DETECTED Final   Adenovirus F40/41 NOT DETECTED NOT DETECTED Final   Astrovirus NOT DETECTED NOT DETECTED Final   Norovirus GI/GII NOT DETECTED NOT  DETECTED Final   Rotavirus A NOT DETECTED NOT DETECTED Final   Sapovirus (I, II, IV, and V) NOT DETECTED NOT DETECTED Final    Comment: Performed at [facility], [address] [place], [place] [address]  Respiratory (~20 pathogens) panel by PCR     Status: Abnormal   Collection Time: 09/01/23  2:51 AM   Specimen: Nasopharyngeal Swab; Respiratory  Result Value Ref Range Status   Adenovirus NOT DETECTED NOT DETECTED Final   Coronavirus 229E NOT DETECTED NOT DETECTED Final    Comment: (NOTE) The Coronavirus on the Respiratory Panel, DOES NOT test for the novel  Coronavirus (2019 nCoV)    Coronavirus HKU1 NOT DETECTED NOT DETECTED Final   Coronavirus NL63 NOT DETECTED NOT DETECTED Final   Coronavirus OC43 NOT DETECTED NOT DETECTED Final   Metapneumovirus NOT DETECTED NOT DETECTED Final   Rhinovirus / Enterovirus DETECTED (A) NOT DETECTED  Final   Influenza A NOT DETECTED NOT DETECTED Final   Influenza B NOT DETECTED NOT DETECTED Final   Parainfluenza Virus 1 NOT DETECTED NOT DETECTED Final   Parainfluenza Virus 2 NOT DETECTED NOT DETECTED Final   Parainfluenza Virus 3 NOT DETECTED NOT DETECTED Final   Parainfluenza Virus 4 NOT DETECTED NOT DETECTED Final   Respiratory Syncytial Virus NOT DETECTED NOT DETECTED Final   Bordetella pertussis NOT DETECTED NOT DETECTED Final   Bordetella Parapertussis NOT DETECTED NOT DETECTED Final   Chlamydophila pneumoniae NOT DETECTED NOT DETECTED Final   Mycoplasma pneumoniae NOT DETECTED NOT DETECTED Final    Comment: Performed at [facility], [address], [place], [place] [address]    Radiology Studies: No results found.    Allyson Tineo T. Yasiel Goyne [facility]  If 7PM-7AM, please contact night-coverage [email or web address] 09/05/2023, 1:20 PM

## 2023-09-05 NOTE — Progress Notes (Signed)
 Mobility Specialist: Progress Note   09/05/23 1438  Mobility  Activity Ambulated with assistance in hallway  Level of Assistance Standby assist, set-up cues, supervision of patient - no hands on  Assistive Device Front wheel walker  Distance Ambulated (ft) 200 ft  Activity Response Tolerated well  Mobility Referral Yes  Mobility visit 1 Mobility  Mobility Specialist Start Time (ACUTE ONLY) 0955  Mobility Specialist Stop Time (ACUTE ONLY) 1023  Mobility Specialist Time Calculation (min) (ACUTE ONLY) 28 min    Pre-Mobility: SpO2 95% 6L HF O2 During Mobility: SpO2 83-93% 6L HF O2 Post-Mobility: SpO2 96% 6L HF O2  Pt was agreeable to mobility session - received in bed. SV throughout. Desat to 83-85% 6LO2 towards end of ambulation but recovered quickly to SpO2 93% with standing break and cues for PLB. Returned to room without fault. Left in chair with all needs met, call bell in reach. Daughter and granddaughter in room.    Maurene Capes Mobility Specialist Please contact via SecureChat or Rehab office at [phone]

## 2023-09-05 NOTE — Plan of Care (Signed)

## 2023-09-06 DIAGNOSIS — J189 Pneumonia, unspecified organism: Secondary | ICD-10-CM | POA: Diagnosis not present

## 2023-09-06 DIAGNOSIS — I10 Essential (primary) hypertension: Secondary | ICD-10-CM | POA: Diagnosis not present

## 2023-09-06 DIAGNOSIS — Z89511 Acquired absence of right leg below knee: Secondary | ICD-10-CM | POA: Diagnosis not present

## 2023-09-06 DIAGNOSIS — G3184 Mild cognitive impairment, so stated: Secondary | ICD-10-CM

## 2023-09-06 DIAGNOSIS — J9601 Acute respiratory failure with hypoxia: Secondary | ICD-10-CM | POA: Diagnosis not present

## 2023-09-06 LAB — COMPREHENSIVE METABOLIC PANEL WITH GFR
ALT: 63 U/L — ABNORMAL HIGH (ref 0–44)
AST: 42 U/L — ABNORMAL HIGH (ref 15–41)
Albumin: 2.2 g/dL — ABNORMAL LOW (ref 3.5–5.0)
Alkaline Phosphatase: 57 U/L (ref 38–126)
Anion gap: 12 (ref 5–15)
BUN: 16 mg/dL (ref 8–23)
CO2: 22 mmol/L (ref 22–32)
Calcium: 7.9 mg/dL — ABNORMAL LOW (ref 8.9–10.3)
Chloride: 105 mmol/L (ref 98–111)
Creatinine, Ser: 0.73 mg/dL (ref 0.44–1.00)
GFR, Estimated: >60 mL/min (ref >60)
Glucose, Bld: 95 mg/dL (ref 70–99)
Potassium: 3.7 mmol/L (ref 3.5–5.1)
Sodium: 139 mmol/L (ref 135–145)
Total Bilirubin: 0.7 mg/dL (ref 0.0–1.2)
Total Protein: 5.4 g/dL — ABNORMAL LOW (ref 6.5–8.1)

## 2023-09-06 LAB — CBC
HCT: 30.1 % — ABNORMAL LOW (ref 36.0–46.0)
Hemoglobin: 9.9 g/dL — ABNORMAL LOW (ref 12.0–15.0)
MCH: 31.7 pg (ref 26.0–34.0)
MCHC: 32.9 g/dL (ref 30.0–36.0)
MCV: 96.5 fL (ref 80.0–100.0)
Platelets: 330 10*3/uL (ref 150–400)
RBC: 3.12 MIL/uL — ABNORMAL LOW (ref 3.87–5.11)
RDW: 13.6 % (ref 11.5–15.5)
WBC: 17.8 10*3/uL — ABNORMAL HIGH (ref 4.0–10.5)
nRBC: 0.1 % (ref 0.0–0.2)

## 2023-09-06 LAB — MAGNESIUM: Magnesium: 1.8 mg/dL (ref 1.7–2.4)

## 2023-09-06 LAB — PROCALCITONIN: Procalcitonin: 0.13 ng/mL

## 2023-09-06 MED ORDER — IPRATROPIUM-ALBUTEROL 0.5-2.5 (3) MG/3ML IN SOLN: 3.0000 mL | Freq: Four times a day (QID) | RESPIRATORY_TRACT | 1 refills | Status: AC | PRN

## 2023-09-06 MED ORDER — ENOXAPARIN SODIUM 40 MG/0.4ML IJ SOSY: 40.0000 mg | PREFILLED_SYRINGE | INTRAMUSCULAR | Status: DC

## 2023-09-06 MED ORDER — SENNOSIDES-DOCUSATE SODIUM 8.6-50 MG PO TABS: 1.0000 | ORAL_TABLET | Freq: Two times a day (BID) | ORAL | Status: DC | PRN

## 2023-09-06 MED ORDER — GUAIFENESIN ER 600 MG PO TB12: 600.0000 mg | ORAL_TABLET | Freq: Two times a day (BID) | ORAL | Status: DC

## 2023-09-06 MED ORDER — ACETYLCYSTEINE 20 % IN SOLN
3.0000 mL | Freq: Two times a day (BID) | RESPIRATORY_TRACT | Status: DC
  Administered 2023-09-06: 3 mL via RESPIRATORY_TRACT
  Filled 2023-09-06 (×2): qty 4

## 2023-09-06 MED ORDER — PREDNISONE 20 MG PO TABS: 40.0000 mg | ORAL_TABLET | Freq: Every day | ORAL | 0 refills | Status: AC

## 2023-09-06 MED ORDER — BUDESONIDE-FORMOTEROL FUMARATE 160-4.5 MCG/ACT IN AERO: 2.0000 | INHALATION_SPRAY | Freq: Two times a day (BID) | RESPIRATORY_TRACT | 12 refills | Status: DC

## 2023-09-06 NOTE — Discharge Summary (Signed)
 Physician Discharge Summary  Shelly [name] ZOX:[id number] DOB: 1951/04/17 DOA: 08/30/2023  PCP: [name], MD  Admit date: 08/30/2023 Discharge date: 09/06/23  Admitted From: Home Disposition: Home Recommendations for Outpatient Follow-up:  Follow up with PCP in 1 to 2 weeks Patient follow-up with pulmonology as soon as possible Reassess oxygen need at follow-up Check CMP and CBC at follow-up Please follow up on the following pending results: None  Home Health: HH RN Equipment/Devices: Home oxygen**by Dunn, nebulizer  Discharge Condition: Stable CODE STATUS: Full code  Follow-up Information     [name], MD. Schedule an appointment as soon as possible for a visit in 1 week(s).   Specialty: Family Medicine Contact information: [phone] [address] [place] [address] [phone]                 Hospital course 73 year old F with PMH of Alzheimer's dementia, RLE AVN s/p BKA, asthma, HTN and HLD presented to ED with fatigue, urinary frequency, cough, nausea, vomiting and abdominal pain, and admitted with working diagnosis of acute respiratory failure with hypoxia due to CAP/possible aspiration pneumonia and possible COPD exacerbation.    In ED, hypoxic requiring 2 L.  Na 131.  Elevated CRP.  COVID-19, influenza and RSV PCR nonreactive.  CXR showed bilateral infiltrate.  CT chest showed RLL pneumonia.  UA did not suggest UTI.  Patient was initially started on CAP coverage with CTX and doxycycline.    RVP positive for rhinovirus.  Urine strep pneumo positive.  Blood cultures NGTD.  Antibiotic escalated to Zosyn and vancomycin on 3/30 due to increased oxygen requirement.  Also started on steroid and breathing treatments for possible COPD exacerbation.  On the day of discharge, oxygen requirement improved but she required 3-4 L to maintain appropriate saturation with ambulation.  Completed antibiotic course.  She is discharged on p.o. prednisone for 3  more days.  Increased home Symbicort.  She has albuterol inhalers as needed.  Also added DuoNeb.  Advised outpatient follow-up with PCP and pulmonology.  She would benefit from PFT.   See individual problem list below for more.   Problems addressed during this hospitalization Acute respiratory failure with hypoxia likely due to community-acquired pneumonia and rhinovirus infection: Infectious workup positive for rhinovirus.  Urine strep pneumo antigen positive.  Also some concern about aspiration given nausea and vomiting on presentation.  CXR concerning for bibasilar infiltrate.  CT A/P concerning for RLL consolidation.  Increased oxygen requirement despite CAP coverage.  Antibiotic escalated to Zosyn and vancomycin on 3/30.  However, MRSA PCR is negative.  Also started on steroid and breathing treatments.  TTE without significant finding.  Low suspicion for PE.  Pro-Cal trending down to normal. -Completed 7 days of appropriate antibiotic course. -Discharge on p.o. prednisone for 3 more days (total of 6 days). -Increased home Symbicort -Albuterol/DuoNeb as needed -Advised to continue IS -Outpatient follow-up with PCP and pulmonology.  Would benefit from PFT -Reassess oxygen need at follow-up.   Alzheimer's dementia without behavioral disturbance:  Acute toxic encephalopathy: Likely iatrogenic from trazodone and Haldol.  She was also sleep deprived. More alert later int he day. No focal neurodeficit.  Oriented x 4 except date.  Doubt new infection while on broad-spectrum antibiotics. -Discontinued sedating meds.    Abdominal pain, nausea, vomiting: Likely due to the above.  Resolved.  CT abdomen and pelvis without significant finding.  GIP negative.    Mild hyponatremia: Likely from dehydration.  Resolved.   Hypertension: Normotensive. -Continue  home Cozaar   Hyperlipidemia:  -Continue home statin   Hypokalemia: Resolved   Elevated vitamin D level: 117.  On vitamin D supplementation at  home. -Discontinued vitamin D  Elevated LFT: Mild -Recheck at follow-up   Debility/deconditioning/right BKA -PT/OT           Time spent 35 minutes  Vital signs Vitals:   09/05/23 2017 09/06/23 0313 09/06/23 0748 09/06/23 0855  BP:  (!) 146/71  135/71  Pulse: 78 99 85 93  Temp:  99.1 F (37.3 C)  99.5 F (37.5 C)  Resp: 18 18 16 19   Height:      Weight:      SpO2: 95% 91% 92% (!) 87%  TempSrc:  Oral  Oral  BMI (Calculated):         Discharge exam  GENERAL: No apparent distress.  Nontoxic. HEENT: MMM.  Vision and hearing grossly intact.  NECK: Supple.  No apparent JVD.  RESP:  No IWOB.  Bilateral clavicles, right > left.  500 cc on incentive spirometry CVS:  RRR. Heart sounds normal.  ABD/GI/GU: BS+. Abd soft, NTND.  MSK/EXT:  Moves extremities.  Right BKA/prosthesis. SKIN: no apparent skin lesion or wound NEURO: Awake, alert and oriented appropriately.  No apparent focal neuro deficit. PSYCH: Calm. Normal affect.   Discharge Instructions Discharge Instructions     Diet - low sodium heart healthy   Complete by: As directed    Discharge instructions   Complete by: As directed    It has been a pleasure taking care of you!  You were hospitalized due to pneumonia and possible COPD for which you have been treated with antibiotics, steroid and breathing treatments.  You have completed antibiotic course.  We are discharging you on more prednisone to complete treatment course for COPD.  We have also changed your inhaler.  Please review your new medication list and the directions on your medications before you take them.  Follow-up with your primary care doctor in 1 to 2 weeks or sooner if needed.  We also recommend follow-up with pulmonologist in 2 to 3 weeks.   Take care,   Increase activity slowly   Complete by: As directed       Allergies as of 09/06/2023       Reactions   Delsym [dextromethorphan] Other (See Comments)   Cognitive impairment         Medication List     STOP taking these medications    benzonatate 200 MG capsule Commonly known as: TESSALON   budesonide-formoterol 80-4.5 MCG/ACT inhaler Commonly known as: SYMBICORT Replaced by: budesonide-formoterol 160-4.5 MCG/ACT inhaler   methocarbamol 500 MG tablet Commonly known as: ROBAXIN   traMADol 50 MG tablet Commonly known as: ULTRAM   Vitamin D-3 125 MCG (5000 UT) Tabs       TAKE these medications    ACETYL L-CARNITINE PO Take 1 tablet by mouth daily.   ACIDOPHILUS PO Take 1 tablet by mouth daily.   acyclovir cream 5 % Commonly known as: ZOVIRAX Apply 1 Application topically 2 (two) times daily as needed (cold sore).   albuterol 108 (90 Base) MCG/ACT inhaler Commonly known as: ProAir HFA 2 puffs every 4 hours as needed only  if your can't catch your breath   APPLE CIDER VINEGAR PO Take 1 tablet by mouth daily.   BIOTIN PLUS KERATIN PO Take 1 tablet by mouth daily.   budesonide-formoterol 160-4.5 MCG/ACT inhaler Commonly known as: Symbicort Inhale 2 puffs into the lungs in  the morning and at bedtime. Replaces: budesonide-formoterol 80-4.5 MCG/ACT inhaler   buPROPion 300 MG 24 hr tablet Commonly known as: WELLBUTRIN XL Take 300 mg by mouth daily.   calcium carbonate 1500 (600 Ca) MG Tabs tablet Commonly known as: OSCAL Take 600 mg of elemental calcium by mouth at bedtime.   Centrum Silver Women 50+ Tabs Take 1 tablet by mouth daily.   denosumab 60 MG/ML Soln injection Commonly known as: PROLIA Inject 60 mg into the skin every 6 (six) months.   donepezil 10 MG tablet Commonly known as: ARICEPT Take 1 tablet (10 mg total) by mouth at bedtime.   FLUoxetine 20 MG capsule Commonly known as: PROZAC Take 20 mg by mouth daily.   FOLIC ACID-VIT B6-VIT B12 PO Take 1 tablet by mouth daily.   guaiFENesin 600 MG 12 hr tablet Commonly known as: MUCINEX Take 1 tablet (600 mg total) by mouth 2 (two) times daily for 5 days.    ipratropium-albuterol 0.5-2.5 (3) MG/3ML Soln Commonly known as: DUONEB Take 3 mLs by nebulization every 6 (six) hours as needed (Shortness of breath, cough and/or wheeze). Use this or albuterol inhaler but not both.   losartan 50 MG tablet Commonly known as: COZAAR Take 1 tablet (50 mg total) by mouth daily.   MAGNESIUM GLYCINATE PO Take 1 tablet by mouth at bedtime.   melatonin 3 MG Tabs tablet Take 3 mg by mouth at bedtime.   montelukast 10 MG tablet Commonly known as: SINGULAIR TAKE 1 TABLET(10 MG) BY MOUTH DAILY What changed: See the new instructions.   ondansetron 8 MG disintegrating tablet Commonly known as: ZOFRAN-ODT Take 8 mg by mouth every 6 (six) hours as needed for vomiting or nausea.   pantoprazole 40 MG tablet Commonly known as: PROTONIX TAKE 30 TO 60 MINUTES BEFORE THE FIRST AND LAST MEAL OF THE DAY What changed:  how much to take how to take this when to take this additional instructions   Potassium 99 MG Tabs Take 99 mg by mouth at bedtime.   predniSONE 20 MG tablet Commonly known as: DELTASONE Take 2 tablets (40 mg total) by mouth daily with breakfast for 3 days.   rosuvastatin 10 MG tablet Commonly known as: CRESTOR Take 1 tablet (10 mg total) by mouth every evening. What changed: when to take this   senna-docusate 8.6-50 MG tablet Commonly known as: Senokot-S Take 1 tablet by mouth 2 (two) times daily between meals as needed for mild constipation.   THEANINE PO Take 1 tablet by mouth at bedtime.   TURMERIC CURCUMIN PO Take 1 tablet by mouth daily.   valACYclovir 1000 MG tablet Commonly known as: VALTREX Take 1,000 mg by mouth See admin instructions. Take 1 tablet (1000mg ) twice daily as needed for cold sores - continue until cold sores resolve.               Durable Medical Equipment  (From admission, onward)           Start     Ordered   09/02/23 1333  For home use only DME oxygen  Once       Question Answer Comment   Length of Need 6 Months   Mode or (Route) Nasal cannula   Liters per Minute 3   Frequency Continuous (stationary and portable oxygen unit needed)   Oxygen conserving device Yes   Oxygen delivery system Gas      09/02/23 1332   09/02/23 1333  For home use only DME Nebulizer  machine  Once       Question Answer Comment  Patient needs a nebulizer to treat with the following condition COPD exacerbation (HCC)   Length of Need Lifetime   Additional equipment included Administration kit   Additional equipment included Filter      09/02/23 1332            Consultations: None  Procedures/Studies:   DG Chest 2 View Result Date: 09/04/2023 CLINICAL DATA:  Hypoxia. EXAM: CHEST - 2 VIEW COMPARISON:  Chest radiograph dated 09/03/2023 FINDINGS: No significant interval change in bilateral mid to lower lung field airspace densities. No pneumothorax. Stable cardiomediastinal silhouette. No acute osseous pathology. IMPRESSION: No significant interval change in bilateral mid to lower lung field airspace densities. Electronically Signed   By: Elgie Collard M.D.   On: 09/04/2023 14:31   DG CHEST PORT 1 VIEW Result Date: 09/03/2023 CLINICAL DATA:  Shortness of breath EXAM: PORTABLE CHEST 1 VIEW COMPARISON:  08/30/2023 FINDINGS: Heart and mediastinal contours within normal limits. Worsening consolidation in the right lower lobe and lingula compatible with pneumonia. Left mid lung nodule again noted, unchanged. No visible effusion or acute bony abnormality. IMPRESSION: Worsening right lower lobe and left perihilar/lingular consolidation concerning for pneumonia. Electronically Signed   By: Charlett Nose M.D.   On: 09/03/2023 00:16   ECHOCARDIOGRAM COMPLETE Result Date: 08/31/2023    ECHOCARDIOGRAM REPORT   Patient Name:   Shelly [name] Date of Exam: 08/31/2023 Medical Rec #:  [id number]         Height:       65.0 in Accession #:    [id number]        Weight:       165.1 lb Date of Birth:  06-07-1950          BSA:          1.823 m Patient Age:    72 years          BP:           91/47 mmHg Patient Gender: F                 HR:           93 bpm. Exam Location:  Inpatient Procedure: 2D Echo, Cardiac Doppler and Color Doppler (Both Spectral and Color            Flow Doppler were utilized during procedure). Indications:    Chest Pain  History:        Patient has no prior history of Echocardiogram examinations.                 Risk Factors:Hypertension.  Sonographer:    Darlys Gales Referring Phys: [facility] IMPRESSIONS  1. Left ventricular ejection fraction, by estimation, is 60 to 65%. The left ventricle has normal function. The left ventricle has no regional wall motion abnormalities. There is mild left ventricular hypertrophy of the basal-septal segment. Left ventricular diastolic parameters are consistent with Grade I diastolic dysfunction (impaired relaxation).  2. Right ventricular systolic function is normal. The right ventricular size is normal.  3. The mitral valve is normal in structure. No evidence of mitral valve regurgitation. No evidence of mitral stenosis.  4. The aortic valve is normal in structure. Aortic valve regurgitation is not visualized. No aortic stenosis is present.  5. The inferior vena cava is normal in size with greater than 50% respiratory variability, suggesting right atrial pressure of 3 mmHg. FINDINGS  Left Ventricle:  Left ventricular ejection fraction, by estimation, is 60 to 65%. The left ventricle has normal function. The left ventricle has no regional wall motion abnormalities. The left ventricular internal cavity size was normal in size. There is  mild left ventricular hypertrophy of the basal-septal segment. Left ventricular diastolic parameters are consistent with Grade I diastolic dysfunction (impaired relaxation). Right Ventricle: The right ventricular size is normal. No increase in right ventricular wall thickness. Right ventricular systolic function is normal. Left  Atrium: Left atrial size was normal in size. Right Atrium: Right atrial size was normal in size. Pericardium: There is no evidence of pericardial effusion. Mitral Valve: The mitral valve is normal in structure. No evidence of mitral valve regurgitation. No evidence of mitral valve stenosis. Tricuspid Valve: The tricuspid valve is normal in structure. Tricuspid valve regurgitation is not demonstrated. No evidence of tricuspid stenosis. Aortic Valve: The aortic valve is normal in structure. Aortic valve regurgitation is not visualized. No aortic stenosis is present. Aortic valve mean gradient measures 5.0 mmHg. Aortic valve peak gradient measures 9.5 mmHg. Aortic valve area, by VTI measures 1.96 cm. Pulmonic Valve: The pulmonic valve was normal in structure. Pulmonic valve regurgitation is not visualized. No evidence of pulmonic stenosis. Aorta: The aortic root is normal in size and structure. Venous: The inferior vena cava is normal in size with greater than 50% respiratory variability, suggesting right atrial pressure of 3 mmHg. IAS/Shunts: No atrial level shunt detected by color flow Doppler.  LEFT VENTRICLE PLAX 2D LVIDd:         2.80 cm   Diastology LVIDs:         1.90 cm   LV e' medial:    11.40 cm/s LV PW:         0.60 cm   LV E/e' medial:  9.0 LV IVS:        1.20 cm   LV e' lateral:   13.30 cm/s LVOT diam:     1.70 cm   LV E/e' lateral: 7.7 LV SV:         58 LV SV Index:   32 LVOT Area:     2.27 cm  RIGHT VENTRICLE RV S prime:     14.50 cm/s TAPSE (M-mode): 2.8 cm LEFT ATRIUM             Index        RIGHT ATRIUM           Index LA Vol (A2C):   38.0 ml 20.84 ml/m  RA Area:     12.40 cm LA Vol (A4C):   22.7 ml 12.45 ml/m  RA Volume:   27.90 ml  15.30 ml/m LA Biplane Vol: 28.7 ml 15.74 ml/m  AORTIC VALVE AV Area (Vmax):    1.74 cm AV Area (Vmean):   1.89 cm AV Area (VTI):     1.96 cm AV Vmax:           154.00 cm/s AV Vmean:          109.000 cm/s AV VTI:            0.298 m AV Peak Grad:      9.5 mmHg AV  Mean Grad:      5.0 mmHg LVOT Vmax:         118.00 cm/s LVOT Vmean:        90.600 cm/s LVOT VTI:          0.257 m LVOT/AV VTI ratio: 0.86  AORTA Ao Root diam:  2.80 cm MITRAL VALVE MV Area (PHT): 2.91 cm     SHUNTS MV Decel Time: 261 msec     Systemic VTI:  0.26 m MV E velocity: 103.00 cm/s  Systemic Diam: 1.70 cm MV A velocity: 130.00 cm/s MV E/A ratio:  0.79 Donato Schultz MD Electronically signed by Donato Schultz MD Signature Date/Time: 08/31/2023/2:31:14 PM    Final    CT ABDOMEN PELVIS W CONTRAST Result Date: 08/30/2023 CLINICAL DATA:  Left lower quadrant pain EXAM: CT ABDOMEN AND PELVIS WITH CONTRAST TECHNIQUE: Multidetector CT imaging of the abdomen and pelvis was performed using the standard protocol following bolus administration of intravenous contrast. RADIATION DOSE REDUCTION: This exam was performed according to the departmental dose-optimization program which includes automated exposure control, adjustment of the mA and/or kV according to patient size and/or use of iterative reconstruction technique. CONTRAST:  75mL OMNIPAQUE IOHEXOL 350 MG/ML SOLN COMPARISON:  CT abdomen and pelvis 01/11/2019 FINDINGS: Lower chest: Right lower lobe ground-glass and airspace opacities are present compatible with pneumonia. There are scattered interstitial opacities throughout both lung bases which are new from prior. There is a trace right pleural effusion. Hepatobiliary: No focal liver abnormality is seen. Calcified granulomas are present. No gallstones, gallbladder wall thickening, or biliary dilatation. Pancreas: Unremarkable. No pancreatic ductal dilatation or surrounding inflammatory changes. Spleen: Calcified granulomas are present.  Normal in size. Adrenals/Urinary Tract: Adrenal glands are unremarkable. Kidneys are normal, without renal calculi, focal lesion, or hydronephrosis. Bladder is unremarkable. Stomach/Bowel: Stomach is within normal limits. Appendix is surgically absent. No evidence of bowel wall  thickening, distention, or inflammatory changes. There is sigmoid colon diverticulosis. Vascular/Lymphatic: Aortic atherosclerosis. No enlarged abdominal or pelvic lymph nodes. Reproductive: Uterus and bilateral adnexa are unremarkable. Other: No abdominal wall hernia or abnormality. No abdominopelvic ascites. Musculoskeletal: Thoracic spinal cord stimulator device present. Severe degenerative changes affect the spine. IMPRESSION: 1. No acute localizing process in the abdomen or pelvis. 2. Right lower lobe pneumonia. 3. Trace right pleural effusion. 4. Sigmoid colon diverticulosis. 5. Aortic atherosclerosis. Aortic Atherosclerosis (ICD10-I70.0). Electronically Signed   By: Darliss Cheney M.D.   On: 08/30/2023 21:41   DG Chest 2 View Result Date: 08/30/2023 CLINICAL DATA:  Shortness of breath. EXAM: CHEST - 2 VIEW COMPARISON:  Chest radiograph dated 06/25/2023 FINDINGS: Patchy areas of airspace opacity in the right lower lobe and lingula consistent with pneumonia. Clinical correlation and follow-up to resolution recommended. Similar appearance of a 5 mm nodule in the left lower lung field, likely a granuloma. Attention on follow-up imaging recommended. No large pleural effusion. No pneumothorax. Stable cardiac silhouette. No acute osseous pathology. Thoracic spine stimulator. IMPRESSION: Right lower lobe and lingular pneumonia. Electronically Signed   By: Elgie Collard M.D.   On: 08/30/2023 11:25       The results of significant diagnostics from this hospitalization (including imaging, microbiology, ancillary and laboratory) are listed below for reference.     Microbiology: Recent Results (from the past 240 hours)  Resp panel by RT-PCR (RSV, Flu A&B, Covid) Anterior Nasal Swab     Status: None   Collection Time: 08/30/23 10:37 AM   Specimen: Anterior Nasal Swab  Result Value Ref Range Status   SARS Coronavirus 2 by RT PCR NEGATIVE NEGATIVE Final    Comment: (NOTE) SARS-CoV-2 target nucleic acids  are NOT DETECTED.  The SARS-CoV-2 RNA is generally detectable in upper respiratory specimens during the acute phase of infection. The lowest concentration of SARS-CoV-2 viral copies this assay can detect is 138 copies/mL.  A negative result does not preclude SARS-Cov-2 infection and should not be used as the sole basis for treatment or other patient management decisions. A negative result may occur with  improper specimen collection/handling, submission of specimen other than nasopharyngeal swab, presence of viral mutation(s) within the areas targeted by this assay, and inadequate number of viral copies(<138 copies/mL). A negative result must be combined with clinical observations, patient history, and epidemiological information. The expected result is Negative.  Fact Sheet for Patients:  BloggerCourse.com  Fact Sheet for Healthcare Providers:  SeriousBroker.it  This test is no t yet approved or cleared by the [place] FDA and  has been authorized for detection and/or diagnosis of SARS-CoV-2 by FDA under an Emergency Use Authorization (EUA). This EUA will remain  in effect (meaning this test can be used) for the duration of the COVID-19 declaration under Section 564(b)(1) of the Act, 21 U.S.C.section 360bbb-3(b)(1), unless the authorization is terminated  or revoked sooner.       Influenza A by PCR NEGATIVE NEGATIVE Final   Influenza B by PCR NEGATIVE NEGATIVE Final    Comment: (NOTE) The Xpert Xpress SARS-CoV-2/FLU/RSV plus assay is intended as an aid in the diagnosis of influenza from Nasopharyngeal swab specimens and should not be used as a sole basis for treatment. Nasal washings and aspirates are unacceptable for Xpert Xpress SARS-CoV-2/FLU/RSV testing.  Fact Sheet for Patients: BloggerCourse.com  Fact Sheet for Healthcare Providers: SeriousBroker.it  This test is  not yet approved or cleared by the [place] FDA and has been authorized for detection and/or diagnosis of SARS-CoV-2 by FDA under an Emergency Use Authorization (EUA). This EUA will remain in effect (meaning this test can be used) for the duration of the COVID-19 declaration under Section 564(b)(1) of the Act, 21 U.S.C. section 360bbb-3(b)(1), unless the authorization is terminated or revoked.     Resp Syncytial Virus by PCR NEGATIVE NEGATIVE Final    Comment: (NOTE) Fact Sheet for Patients: BloggerCourse.com  Fact Sheet for Healthcare Providers: SeriousBroker.it  This test is not yet approved or cleared by the [place] FDA and has been authorized for detection and/or diagnosis of SARS-CoV-2 by FDA under an Emergency Use Authorization (EUA). This EUA will remain in effect (meaning this test can be used) for the duration of the COVID-19 declaration under Section 564(b)(1) of the Act, 21 U.S.C. section 360bbb-3(b)(1), unless the authorization is terminated or revoked.  Performed at [facility], [address], [place], [place] [address]   MRSA Next Gen by PCR, Nasal     Status: None   Collection Time: 08/31/23  4:35 AM   Specimen: Nasal Mucosa; Nasal Swab  Result Value Ref Range Status   MRSA by PCR Next Gen NOT DETECTED NOT DETECTED Final    Comment: (NOTE) The GeneXpert MRSA Assay (FDA approved for NASAL specimens only), is one component of a comprehensive MRSA colonization surveillance program. It is not intended to diagnose MRSA infection nor to guide or monitor treatment for MRSA infections. Test performance is not FDA approved in patients less than 7 years old. Performed at [facility], [address], [place], [place] [address]   Culture, blood (Routine X 2) w Reflex to ID Panel     Status: None   Collection Time: 08/31/23 12:58 PM   Specimen: BLOOD LEFT HAND  Result Value Ref  Range Status   Specimen Description BLOOD LEFT HAND  Final   Special Requests   Final    BOTTLES DRAWN  AEROBIC AND ANAEROBIC Blood Culture results may not be optimal due to an inadequate volume of blood received in culture bottles   Culture   Final    NO GROWTH 5 DAYS Performed at [facility], [address] [place], [place] [address]    Report Status 09/05/2023 FINAL  Final  Culture, blood (Routine X 2) w Reflex to ID Panel     Status: None   Collection Time: 08/31/23 12:58 PM   Specimen: BLOOD RIGHT HAND  Result Value Ref Range Status   Specimen Description BLOOD RIGHT HAND  Final   Special Requests   Final    BOTTLES DRAWN AEROBIC AND ANAEROBIC Blood Culture results may not be optimal due to an inadequate volume of blood received in culture bottles   Culture   Final    NO GROWTH 5 DAYS Performed at [facility], [address] [place], [place] [address]    Report Status 09/05/2023 FINAL  Final  Gastrointestinal Panel by PCR , Stool     Status: None   Collection Time: 09/01/23  2:41 AM   Specimen: Stool  Result Value Ref Range Status   Campylobacter species NOT DETECTED NOT DETECTED Final   Plesimonas shigelloides NOT DETECTED NOT DETECTED Final   Salmonella species NOT DETECTED NOT DETECTED Final   Yersinia enterocolitica NOT DETECTED NOT DETECTED Final   Vibrio species NOT DETECTED NOT DETECTED Final   Vibrio cholerae NOT DETECTED NOT DETECTED Final   Enteroaggregative E coli (EAEC) NOT DETECTED NOT DETECTED Final   Enteropathogenic E coli (EPEC) NOT DETECTED NOT DETECTED Final   Enterotoxigenic E coli (ETEC) NOT DETECTED NOT DETECTED Final   Shiga like toxin producing E coli (STEC) NOT DETECTED NOT DETECTED Final   Shigella/Enteroinvasive E coli (EIEC) NOT DETECTED NOT DETECTED Final   Cryptosporidium NOT DETECTED NOT DETECTED Final   Cyclospora cayetanensis NOT DETECTED NOT DETECTED Final   Entamoeba histolytica NOT DETECTED NOT DETECTED Final   Giardia  lamblia NOT DETECTED NOT DETECTED Final   Adenovirus F40/41 NOT DETECTED NOT DETECTED Final   Astrovirus NOT DETECTED NOT DETECTED Final   Norovirus GI/GII NOT DETECTED NOT DETECTED Final   Rotavirus A NOT DETECTED NOT DETECTED Final   Sapovirus (I, II, IV, and V) NOT DETECTED NOT DETECTED Final    Comment: Performed at [facility], [address] [place], [place] [address]  Respiratory (~20 pathogens) panel by PCR     Status: Abnormal   Collection Time: 09/01/23  2:51 AM   Specimen: Nasopharyngeal Swab; Respiratory  Result Value Ref Range Status   Adenovirus NOT DETECTED NOT DETECTED Final   Coronavirus 229E NOT DETECTED NOT DETECTED Final    Comment: (NOTE) The Coronavirus on the Respiratory Panel, DOES NOT test for the novel  Coronavirus (2019 nCoV)    Coronavirus HKU1 NOT DETECTED NOT DETECTED Final   Coronavirus NL63 NOT DETECTED NOT DETECTED Final   Coronavirus OC43 NOT DETECTED NOT DETECTED Final   Metapneumovirus NOT DETECTED NOT DETECTED Final   Rhinovirus / Enterovirus DETECTED (A) NOT DETECTED Final   Influenza A NOT DETECTED NOT DETECTED Final   Influenza B NOT DETECTED NOT DETECTED Final   Parainfluenza Virus 1 NOT DETECTED NOT DETECTED Final   Parainfluenza Virus 2 NOT DETECTED NOT DETECTED Final   Parainfluenza Virus 3 NOT DETECTED NOT DETECTED Final   Parainfluenza Virus 4 NOT DETECTED NOT DETECTED Final   Respiratory Syncytial Virus NOT DETECTED NOT DETECTED Final   Bordetella  pertussis NOT DETECTED NOT DETECTED Final   Bordetella Parapertussis NOT DETECTED NOT DETECTED Final   Chlamydophila pneumoniae NOT DETECTED NOT DETECTED Final   Mycoplasma pneumoniae NOT DETECTED NOT DETECTED Final    Comment: Performed at [facility], [address], [place], [place] [address]     Labs:  CBC: Recent Labs  Lab 08/31/23 0801 09/01/23 6045 09/03/23 0200 09/04/23 0357 09/05/23 0330 09/06/23 0404  WBC 7.4 7.7 11.0* 13.1* 16.0* 17.8*  NEUTROABS 6.0   --   --   --   --   --   HGB 10.4* 10.2* 11.0* 10.0* 9.4* 9.9*  HCT 32.1* 31.6* 32.8* 30.0* 28.4* 30.1*  MCV 97.9 97.5 94.3 94.3 95.6 96.5  PLT 179 212 248 277 289 330   BMP &GFR Recent Labs  Lab 09/01/23 0812 09/02/23 0621 09/03/23 0200 09/03/23 0242 09/04/23 0357 09/05/23 0330 09/06/23 0404  NA 138  --  137  --  138 140 139  K 3.1*  --  3.4*  --  3.2* 3.2* 3.7  CL 104  --  100  --  101 104 105  CO2 25  --  24  --  26 27 22   GLUCOSE 89  --  106*  --  150* 117* 95  BUN 9  --  7*  --  12 15 16   CREATININE 0.57  --  0.70  --  0.68 0.66 0.73  CALCIUM 7.6*  --  8.0*  --  7.6* 7.8* 7.9*  MG 2.1 2.1  --  1.8 2.0 2.2 1.8  PHOS  --   --   --  2.7 2.9 2.3*  --    Estimated Creatinine Clearance: 62.5 mL/min (by C-G formula based on SCr of 0.73 mg/dL). Liver & Pancreas: Recent Labs  Lab 09/03/23 0200 09/05/23 0330 09/06/23 0404  AST 53*  --  42*  ALT 46*  --  63*  ALKPHOS 64  --  57  BILITOT 1.2  --  0.7  PROT 5.9*  --  5.4*  ALBUMIN 2.5* 2.1* 2.2*   No results for input(s): "LIPASE", "AMYLASE" in the last 168 hours. No results for input(s): "AMMONIA" in the last 168 hours. Diabetic: No results for input(s): "HGBA1C" in the last 72 hours. Recent Labs  Lab 09/03/23 2026  GLUCAP 138*   Cardiac Enzymes: No results for input(s): "CKTOTAL", "CKMB", "CKMBINDEX", "TROPONINI" in the last 168 hours. No results for input(s): "PROBNP" in the last 8760 hours. Coagulation Profile: Recent Labs  Lab 08/30/23 1745  INR 1.2   Thyroid Function Tests: No results for input(s): "TSH", "T4TOTAL", "FREET4", "T3FREE", "THYROIDAB" in the last 72 hours. Lipid Profile: No results for input(s): "CHOL", "HDL", "LDLCALC", "TRIG", "CHOLHDL", "LDLDIRECT" in the last 72 hours. Anemia Panel: No results for input(s): "VITAMINB12", "FOLATE", "FERRITIN", "TIBC", "IRON", "RETICCTPCT" in the last 72 hours. Urine analysis:    Component Value Date/Time   COLORURINE YELLOW 08/31/2023 0425    APPEARANCEUR CLEAR 08/31/2023 0425   LABSPEC >1.046 (H) 08/31/2023 0425   PHURINE 5.0 08/31/2023 0425   GLUCOSEU NEGATIVE 08/31/2023 0425   GLUCOSEU NEGATIVE 09/08/2010 1617   HGBUR NEGATIVE 08/31/2023 0425   BILIRUBINUR NEGATIVE 08/31/2023 0425   KETONESUR 5 (A) 08/31/2023 0425   PROTEINUR 30 (A) 08/31/2023 0425   UROBILINOGEN 0.2 09/08/2010 1617   NITRITE NEGATIVE 08/31/2023 0425   LEUKOCYTESUR NEGATIVE 08/31/2023 0425   Sepsis Labs: Invalid input(s): "PROCALCITONIN", "LACTICIDVEN"   SIGNED:  Almon Hercules, MD  [facility] 09/06/2023, 5:01  PM

## 2023-09-06 NOTE — TOC Progression Note (Signed)
 Transition of Care [facility]) - Progression Note    Patient Details  Name: Shelly [name] MRN: [id number] Date of Birth: 04/19/1951  [facility] CM/SW Contact  Janae Bridgeman, RN Phone Number: 09/06/2023, 4:38 PM  Clinical Narrative:    09/06/23 Patient met with the patient earlier in the afternoon and patient was sleepy and unable to arouse after pm medications last night.  I called and spoke with the patient's daughter by phone and she states that if the patient is more awake later on in the day - she requests to call her on the phone so that she could provide transportation to home via car.  The daughter, Danella Penton plans to stay with the patient.  Portable oxygen tank is present in the room for home.  The daughter has the nebulizer machine at the home.  Patient is set up with [facility] for home health services for RN.  09/29/23 1640 - I spoke with bedside nursing and she states that the patient is awake and was ambulating in the hallway.  Attending MD is aware that patient is more alert.  Charge RN will follow up to determine if physician would like the daughter to be called for patient's potential discharge to home.   Expected Discharge Plan: Home/Self Care Barriers to Discharge: Continued Medical Work up  Expected Discharge Plan and Services         Expected Discharge Date: 09/06/23               DME Arranged: Oxygen, Nebulizer machine DME Agency: [employer] Date DME Agency Contacted: 09/02/23 Time DME Agency Contacted: [phone] Representative spoke with at DME Agency: Vaughan Basta             Social Determinants of Health (SDOH) Interventions SDOH Screenings   Food Insecurity: No Food Insecurity (08/30/2023)  Housing: Low Risk  (08/30/2023)  Transportation Needs: No Transportation Needs (08/30/2023)  Utilities: Not At Risk (08/30/2023)  Social Connections: Moderately Isolated (08/30/2023)  Tobacco Use: Medium Risk (08/30/2023)    Readmission Risk  Interventions    09/05/2023    4:03 PM  Readmission Risk Prevention Plan  Transportation Screening Complete  PCP or Specialist Appt within 5-7 Days Complete  Home Care Screening Complete  Medication Review (RN CM) Complete

## 2023-09-06 NOTE — Progress Notes (Signed)
 Mobility Specialist: Progress Note   09/06/23 1242  Mobility  Activity Ambulated with assistance in hallway  Level of Assistance Standby assist, set-up cues, supervision of patient - no hands on  Assistive Device Front wheel walker  Distance Ambulated (ft) 150 ft  Activity Response Tolerated well  Mobility Referral Yes  Mobility visit 1 Mobility  Mobility Specialist Start Time (ACUTE ONLY) 1058  Mobility Specialist Stop Time (ACUTE ONLY) 1115  Mobility Specialist Time Calculation (min) (ACUTE ONLY) 17 min    Pre-Mobility: SpO2 93% 4LO2 HFNC During Mobility: SpO2 85-92% 4LO2 HFNC Post-Mobility: SpO2 94% 4LO2 HFNC  Pt was agreeable to mobility session despite not feeling well this morning and not getting any sleep last night - received in bed. SV throughout. Desat to SpO2 85% 4LO2 HFNC, but recovered quickly with standing break and cues for PLB. Returned to room without fault. Left in chair with all needs met, call bell in reach.   Maurene Capes Mobility Specialist Please contact via SecureChat or Rehab office at [phone]

## 2023-09-06 NOTE — Plan of Care (Signed)

## 2023-09-06 NOTE — Progress Notes (Addendum)
 Mobility Specialist: Progress Note   09/06/23 1617  Mobility  Activity Ambulated with assistance in hallway  Level of Assistance Standby assist, set-up cues, supervision of patient - no hands on  Assistive Device Front wheel walker  Distance Ambulated (ft) 150 ft  Activity Response Tolerated fair  Mobility Referral Yes  Mobility visit 1 Mobility  Mobility Specialist Start Time (ACUTE ONLY) 1600  Mobility Specialist Stop Time (ACUTE ONLY) 1611  Mobility Specialist Time Calculation (min) (ACUTE ONLY) 11 min    Pre Mobility: SpO2 94% 3LO2 HFNC During Mobility: SpO2 83-92% 3LO2 HFNC Post Mobility: SpO2 93% 3LO2 HFNC  Pt was agreeable to mobility session - received in chair. Completed ambulatory sat test (see following note). SV throughout. Desat to SpO2 83-85% 3LO2 HFNC but recovered with standing break and cues for PLB; however, taking a little longer to recover today than in previous sessions. Still very tired. Returned to room without fault. Left in chair with all needs met, call bell in reach.   Shelly [name] Mobility Specialist Please contact via SecureChat or Rehab office at [phone]

## 2023-09-06 NOTE — Progress Notes (Signed)
 PROGRESS NOTE  Shelly [name] WGN:[id number] DOB: 1950-08-18   PCP: [name], MD  Patient is from: Home.  Lives alone.  Independently ambulates at baseline.  DOA: 08/30/2023 LOS: 7  Chief complaints Chief Complaint  Patient presents with   Fatigue   Urinary Frequency   Cough     Brief Narrative / Interim history: 73 year old F with PMH of Alzheimer's dementia, RLE AVN s/p BKA, asthma, HTN and HLD presented to ED with fatigue, urinary frequency, cough, nausea, vomiting and abdominal pain, and admitted with working diagnosis of acute respiratory failure with hypoxia due to CAP/possible aspiration pneumonia and possible COPD exacerbation.   In ED, hypoxic requiring 2 L.  Na 131.  Elevated CRP.  COVID-19, influenza and RSV PCR nonreactive.  CXR showed bilateral infiltrate.  CT chest showed RLL pneumonia.  UA did not suggest UTI.  Patient was initially started on CAP coverage with CTX and doxycycline.   RVP positive for rhinovirus.  Urine strep pneumo positive.  Blood cultures NGTD.  Antibiotic escalated to Zosyn and vancomycin on 3/30 due to increased oxygen requirement.  Also started on steroid and breathing treatments for possible COPD exacerbation.  Subjective: Seen and examined earlier this morning.  Patient had difficulty sleeping last night.  She was given trazodone.  She was also given IV Haldol likely for the nightmare she had.  She is drowsy this morning and this afternoon.  Denies shortness of breath.  Still requiring 3 L at rest.  Objective: Vitals:   09/05/23 2017 09/06/23 0313 09/06/23 0748 09/06/23 0855  BP:  (!) 146/71  135/71  Pulse: 78 99 85 93  Resp: 18 18 16 19   Temp:  99.1 F (37.3 C)  99.5 F (37.5 C)  TempSrc:  Oral  Oral  SpO2: 95% 91% 92% (!) 87%  Weight:      Height:        Examination:  GENERAL: No apparent distress.  Nontoxic. HEENT: MMM.  Vision and hearing grossly intact.  NECK: Supple.  No apparent JVD.  RESP:  No IWOB.  Bilateral  clavicles, right > left.  500 cc on incentive spirometry CVS:  RRR. Heart sounds normal.  ABD/GI/GU: BS+. Abd soft, NTND.  MSK/EXT:  Moves extremities.  Right BKA. SKIN: no apparent skin lesion or wound NEURO: Awake but not alert.  For sleep quickly.  Oriented x 4 except date.  Follows commands.  Clear speech.  No facial asymmetry.  Motors intact in all dermatomes. PSYCH: Calm. Normal affect.   Consultants:  None  Procedures: None  Microbiology summarized: 3/26-COVID-19, influenza and RSV PCR nonreactive 3/28-RVP positive for rhinovirus 3/27-MRSA PCR screen negative 3/27-blood cultures NGTD 3/28-GI panel negative   Assessment and plan: Acute respiratory failure with hypoxia likely due to community-acquired pneumonia and rhinovirus infection: Infectious workup positive for rhinovirus.  Urine strep pneumo antigen positive.  Also some concern about aspiration given nausea and vomiting on presentation.  CXR concerning for bibasilar infiltrate.  CT A/P concerning for RLL consolidation.  Increased oxygen requirement despite CAP coverage.  Antibiotic escalated to Zosyn and vancomycin on 3/30.  However, MRSA PCR is negative.  Also started on steroid and breathing treatments.  TTE without significant finding.  Low suspicion for PE.  Pro-Cal trending down. -Continue IV Zosyn, prednisone, Pulmicort, Brovana and Yupelri. -And Zithromax on 3/31 for possible atypical coverage -Continue DuoNebs and mucolytic's as needed -Encourage IS, OOB.  -Continue PT/OT -Needs outpatient PFT  Alzheimer's dementia without behavioral disturbance:  Acute toxic encephalopathy: Likely  iatrogenic from trazodone and Haldol.  She is also sleep deprived.  Sleepy but wakes to voice.  Not alert.  Falls back asleep quickly.  No focal neurodeficit.  Oriented x 4 except date.  Doubt new infection while on broad-spectrum antibiotics. -Discontinue trazodone and Haldol -Avoid any sedating medications. -Continue delirium  precaution, frequent reorientation.   -Check ammonia, VBG, TSH, and B12. -Encourage hydration.   Abdominal pain, nausea, vomiting: Likely due to the above.  Resolved.  CT abdomen and pelvis without significant finding.  GIP negative.  -Continue bowel regimen  Elevated vitamin D level: No vitamin D on her medication list. -Would advise not to take at home  Elevated LFT: Mild -Check CK -Check LFT in the morning   Mild hyponatremia: Likely from dehydration.  Resolved.   Hypertension: Normotensive. -Continue home Cozaar   Hyperlipidemia:  -Continue home statin  Hypokalemia -Monitor replenish as appropriate  Debility/deconditioning/right BKA -PT/OT reevaluation  Body mass index is 27.48 kg/m.          DVT prophylaxis:  heparin injection 5,000 Units Start: 08/30/23 2200  Code Status: Full code Family Communication: Updated patient's daughter, Ashok Cordia at bedside and over the phone. Level of care: Telemetry Medical Status is: Inpatient Remains inpatient appropriate because: Acute respiratory failure with hypoxia, pneumonia and encephalopathy   Final disposition: Home   55 minutes with more than 50% spent in reviewing records, counseling patient/family and coordinating care.   Sch Meds:  Scheduled Meds:  acetylcysteine  3 mL Nebulization BID   acidophilus  1 capsule Oral Daily   arformoterol  15 mcg Nebulization BID   aspirin  81 mg Oral Daily   azithromycin  500 mg Oral Daily   budesonide (PULMICORT) nebulizer solution  0.5 mg Nebulization BID   buPROPion  300 mg Oral Daily   cholecalciferol  2,000 Units Oral Daily   donepezil  10 mg Oral QHS   FLUoxetine  20 mg Oral Daily   Gerhardt's butt cream   Topical TID   guaiFENesin  600 mg Oral BID   heparin  5,000 Units Subcutaneous Q8H   losartan  50 mg Oral Daily   montelukast  10 mg Oral QHS   multivitamin with minerals  1 tablet Oral Daily   pantoprazole  40 mg Oral Daily   polyethylene glycol  17 g Oral  Daily   predniSONE  40 mg Oral Q breakfast   revefenacin  175 mcg Nebulization Daily   rosuvastatin  10 mg Oral QPM   senna-docusate  1 tablet Oral BID   Continuous Infusions:  piperacillin-tazobactam (ZOSYN)  IV 3.375 g (09/06/23 1432)   PRN Meds:.acetaminophen, bismuth subsalicylate, ipratropium-albuterol, ondansetron (ZOFRAN) IV, sodium chloride flush  Antimicrobials: Anti-infectives (From admission, onward)    Start     Dose/Rate Route Frequency Ordered Stop   09/04/23 1015  azithromycin (ZITHROMAX) tablet 500 mg        500 mg Oral Daily 09/04/23 0921 09/09/23 0959   09/04/23 0500  vancomycin (VANCOREADY) IVPB 1250 mg/250 mL  Status:  Discontinued        1,250 mg 166.7 mL/hr over 90 Minutes Intravenous Every 24 hours 09/03/23 0059 09/04/23 0725   09/03/23 0600  piperacillin-tazobactam (ZOSYN) IVPB 3.375 g        3.375 g 12.5 mL/hr over 240 Minutes Intravenous Every 8 hours 09/03/23 0059     09/03/23 0200  vancomycin (VANCOREADY) IVPB 1750 mg/350 mL        1,750 mg 175 mL/hr over 120 Minutes Intravenous  Once 09/03/23 0059 09/03/23 0515   08/30/23 1830  Ampicillin-Sulbactam (UNASYN) 3 g in sodium chloride 0.9 % 100 mL IVPB  Status:  Discontinued        3 g 200 mL/hr over 30 Minutes Intravenous Every 6 hours 08/30/23 1744 09/03/23 0043   08/30/23 1800  cefTRIAXone (ROCEPHIN) 1 g in sodium chloride 0.9 % 100 mL IVPB  Status:  Discontinued        1 g 200 mL/hr over 30 Minutes Intravenous Every 24 hours 08/30/23 1704 08/30/23 1742   08/30/23 1800  azithromycin (ZITHROMAX) 500 mg in sodium chloride 0.9 % 250 mL IVPB  Status:  Discontinued        500 mg 250 mL/hr over 60 Minutes Intravenous Every 24 hours 08/30/23 1704 08/31/23 0951   08/30/23 1800  doxycycline (VIBRAMYCIN) 100 mg in sodium chloride 0.9 % 250 mL IVPB  Status:  Discontinued        100 mg 125 mL/hr over 120 Minutes Intravenous Every 12 hours 08/30/23 1706 08/30/23 1743   08/30/23 1230  cefTRIAXone (ROCEPHIN) 1 g in  sodium chloride 0.9 % 100 mL IVPB        1 g 200 mL/hr over 30 Minutes Intravenous  Once 08/30/23 1220 08/30/23 1329   08/30/23 1230  doxycycline (VIBRAMYCIN) 100 mg in sodium chloride 0.9 % 250 mL IVPB        100 mg 125 mL/hr over 120 Minutes Intravenous  Once 08/30/23 1220 08/30/23 1554        I have personally reviewed the following labs and images: CBC: Recent Labs  Lab 08/31/23 0801 09/01/23 0812 09/03/23 0200 09/04/23 0357 09/05/23 0330 09/06/23 0404  WBC 7.4 7.7 11.0* 13.1* 16.0* 17.8*  NEUTROABS 6.0  --   --   --   --   --   HGB 10.4* 10.2* 11.0* 10.0* 9.4* 9.9*  HCT 32.1* 31.6* 32.8* 30.0* 28.4* 30.1*  MCV 97.9 97.5 94.3 94.3 95.6 96.5  PLT 179 212 248 277 289 330   BMP &GFR Recent Labs  Lab 09/01/23 0812 09/02/23 0621 09/03/23 0200 09/03/23 0242 09/04/23 0357 09/05/23 0330 09/06/23 0404  NA 138  --  137  --  138 140 139  K 3.1*  --  3.4*  --  3.2* 3.2* 3.7  CL 104  --  100  --  101 104 105  CO2 25  --  24  --  26 27 22   GLUCOSE 89  --  106*  --  150* 117* 95  BUN 9  --  7*  --  12 15 16   CREATININE 0.57  --  0.70  --  0.68 0.66 0.73  CALCIUM 7.6*  --  8.0*  --  7.6* 7.8* 7.9*  MG 2.1 2.1  --  1.8 2.0 2.2 1.8  PHOS  --   --   --  2.7 2.9 2.3*  --    Estimated Creatinine Clearance: 62.5 mL/min (by C-G formula based on SCr of 0.73 mg/dL). Liver & Pancreas: Recent Labs  Lab 09/03/23 0200 09/05/23 0330 09/06/23 0404  AST 53*  --  42*  ALT 46*  --  63*  ALKPHOS 64  --  57  BILITOT 1.2  --  0.7  PROT 5.9*  --  5.4*  ALBUMIN 2.5* 2.1* 2.2*   No results for input(s): "LIPASE", "AMYLASE" in the last 168 hours. No results for input(s): "AMMONIA" in the last 168 hours. Diabetic: No results for input(s): "HGBA1C" in the last  72 hours. Recent Labs  Lab 09/03/23 2026  GLUCAP 138*   Cardiac Enzymes: No results for input(s): "CKTOTAL", "CKMB", "CKMBINDEX", "TROPONINI" in the last 168 hours. No results for input(s): "PROBNP" in the last 8760  hours. Coagulation Profile: Recent Labs  Lab 08/30/23 1745  INR 1.2   Thyroid Function Tests: No results for input(s): "TSH", "T4TOTAL", "FREET4", "T3FREE", "THYROIDAB" in the last 72 hours. Lipid Profile: No results for input(s): "CHOL", "HDL", "LDLCALC", "TRIG", "CHOLHDL", "LDLDIRECT" in the last 72 hours. Anemia Panel: No results for input(s): "VITAMINB12", "FOLATE", "FERRITIN", "TIBC", "IRON", "RETICCTPCT" in the last 72 hours. Urine analysis:    Component Value Date/Time   COLORURINE YELLOW 08/31/2023 0425   APPEARANCEUR CLEAR 08/31/2023 0425   LABSPEC >1.046 (H) 08/31/2023 0425   PHURINE 5.0 08/31/2023 0425   GLUCOSEU NEGATIVE 08/31/2023 0425   GLUCOSEU NEGATIVE 09/08/2010 1617   HGBUR NEGATIVE 08/31/2023 0425   BILIRUBINUR NEGATIVE 08/31/2023 0425   KETONESUR 5 (A) 08/31/2023 0425   PROTEINUR 30 (A) 08/31/2023 0425   UROBILINOGEN 0.2 09/08/2010 1617   NITRITE NEGATIVE 08/31/2023 0425   LEUKOCYTESUR NEGATIVE 08/31/2023 0425   Sepsis Labs: Invalid input(s): "PROCALCITONIN", "LACTICIDVEN"  Microbiology: Recent Results (from the past 240 hours)  Resp panel by RT-PCR (RSV, Flu A&B, Covid) Anterior Nasal Swab     Status: None   Collection Time: 08/30/23 10:37 AM   Specimen: Anterior Nasal Swab  Result Value Ref Range Status   SARS Coronavirus 2 by RT PCR NEGATIVE NEGATIVE Final    Comment: (NOTE) SARS-CoV-2 target nucleic acids are NOT DETECTED.  The SARS-CoV-2 RNA is generally detectable in upper respiratory specimens during the acute phase of infection. The lowest concentration of SARS-CoV-2 viral copies this assay can detect is 138 copies/mL. A negative result does not preclude SARS-Cov-2 infection and should not be used as the sole basis for treatment or other patient management decisions. A negative result may occur with  improper specimen collection/handling, submission of specimen other than nasopharyngeal swab, presence of viral mutation(s) within the areas  targeted by this assay, and inadequate number of viral copies(<138 copies/mL). A negative result must be combined with clinical observations, patient history, and epidemiological information. The expected result is Negative.  Fact Sheet for Patients:  BloggerCourse.com  Fact Sheet for Healthcare Providers:  SeriousBroker.it  This test is no t yet approved or cleared by the [place] FDA and  has been authorized for detection and/or diagnosis of SARS-CoV-2 by FDA under an Emergency Use Authorization (EUA). This EUA will remain  in effect (meaning this test can be used) for the duration of the COVID-19 declaration under Section 564(b)(1) of the Act, 21 U.S.C.section 360bbb-3(b)(1), unless the authorization is terminated  or revoked sooner.       Influenza A by PCR NEGATIVE NEGATIVE Final   Influenza B by PCR NEGATIVE NEGATIVE Final    Comment: (NOTE) The Xpert Xpress SARS-CoV-2/FLU/RSV plus assay is intended as an aid in the diagnosis of influenza from Nasopharyngeal swab specimens and should not be used as a sole basis for treatment. Nasal washings and aspirates are unacceptable for Xpert Xpress SARS-CoV-2/FLU/RSV testing.  Fact Sheet for Patients: BloggerCourse.com  Fact Sheet for Healthcare Providers: SeriousBroker.it  This test is not yet approved or cleared by the [place] FDA and has been authorized for detection and/or diagnosis of SARS-CoV-2 by FDA under an Emergency Use Authorization (EUA). This EUA will remain in effect (meaning this test can be used) for the duration of the COVID-19 declaration under  Section 564(b)(1) of the Act, 21 U.S.C. section 360bbb-3(b)(1), unless the authorization is terminated or revoked.     Resp Syncytial Virus by PCR NEGATIVE NEGATIVE Final    Comment: (NOTE) Fact Sheet for  Patients: BloggerCourse.com  Fact Sheet for Healthcare Providers: SeriousBroker.it  This test is not yet approved or cleared by the [place] FDA and has been authorized for detection and/or diagnosis of SARS-CoV-2 by FDA under an Emergency Use Authorization (EUA). This EUA will remain in effect (meaning this test can be used) for the duration of the COVID-19 declaration under Section 564(b)(1) of the Act, 21 U.S.C. section 360bbb-3(b)(1), unless the authorization is terminated or revoked.  Performed at [facility], [address], [place], [place] [address]   MRSA Next Gen by PCR, Nasal     Status: None   Collection Time: 08/31/23  4:35 AM   Specimen: Nasal Mucosa; Nasal Swab  Result Value Ref Range Status   MRSA by PCR Next Gen NOT DETECTED NOT DETECTED Final    Comment: (NOTE) The GeneXpert MRSA Assay (FDA approved for NASAL specimens only), is one component of a comprehensive MRSA colonization surveillance program. It is not intended to diagnose MRSA infection nor to guide or monitor treatment for MRSA infections. Test performance is not FDA approved in patients less than 21 years old. Performed at [facility], [address], [place], [place] [address]   Culture, blood (Routine X 2) w Reflex to ID Panel     Status: None   Collection Time: 08/31/23 12:58 PM   Specimen: BLOOD LEFT HAND  Result Value Ref Range Status   Specimen Description BLOOD LEFT HAND  Final   Special Requests   Final    BOTTLES DRAWN AEROBIC AND ANAEROBIC Blood Culture results may not be optimal due to an inadequate volume of blood received in culture bottles   Culture   Final    NO GROWTH 5 DAYS Performed at [facility], [address], [place], [place] [address]    Report Status 09/05/2023 FINAL  Final  Culture, blood (Routine X 2) w Reflex to ID Panel     Status: None   Collection Time: 08/31/23 12:58 PM    Specimen: BLOOD RIGHT HAND  Result Value Ref Range Status   Specimen Description BLOOD RIGHT HAND  Final   Special Requests   Final    BOTTLES DRAWN AEROBIC AND ANAEROBIC Blood Culture results may not be optimal due to an inadequate volume of blood received in culture bottles   Culture   Final    NO GROWTH 5 DAYS Performed at [facility], [address], [place], [place] [address]    Report Status 09/05/2023 FINAL  Final  Gastrointestinal Panel by PCR , Stool     Status: None   Collection Time: 09/01/23  2:41 AM   Specimen: Stool  Result Value Ref Range Status   Campylobacter species NOT DETECTED NOT DETECTED Final   Plesimonas shigelloides NOT DETECTED NOT DETECTED Final   Salmonella species NOT DETECTED NOT DETECTED Final   Yersinia enterocolitica NOT DETECTED NOT DETECTED Final   Vibrio species NOT DETECTED NOT DETECTED Final   Vibrio cholerae NOT DETECTED NOT DETECTED Final   Enteroaggregative E coli (EAEC) NOT DETECTED NOT DETECTED Final   Enteropathogenic E coli (EPEC) NOT DETECTED NOT DETECTED Final   Enterotoxigenic E coli (ETEC) NOT DETECTED NOT DETECTED Final   Shiga like toxin producing E coli (STEC) NOT DETECTED NOT  DETECTED Final   Shigella/Enteroinvasive E coli (EIEC) NOT DETECTED NOT DETECTED Final   Cryptosporidium NOT DETECTED NOT DETECTED Final   Cyclospora cayetanensis NOT DETECTED NOT DETECTED Final   Entamoeba histolytica NOT DETECTED NOT DETECTED Final   Giardia lamblia NOT DETECTED NOT DETECTED Final   Adenovirus F40/41 NOT DETECTED NOT DETECTED Final   Astrovirus NOT DETECTED NOT DETECTED Final   Norovirus GI/GII NOT DETECTED NOT DETECTED Final   Rotavirus A NOT DETECTED NOT DETECTED Final   Sapovirus (I, II, IV, and V) NOT DETECTED NOT DETECTED Final    Comment: Performed at [facility], [address] [place], [place] [address]  Respiratory (~20 pathogens) panel by PCR     Status: Abnormal   Collection Time: 09/01/23  2:51 AM    Specimen: Nasopharyngeal Swab; Respiratory  Result Value Ref Range Status   Adenovirus NOT DETECTED NOT DETECTED Final   Coronavirus 229E NOT DETECTED NOT DETECTED Final    Comment: (NOTE) The Coronavirus on the Respiratory Panel, DOES NOT test for the novel  Coronavirus (2019 nCoV)    Coronavirus HKU1 NOT DETECTED NOT DETECTED Final   Coronavirus NL63 NOT DETECTED NOT DETECTED Final   Coronavirus OC43 NOT DETECTED NOT DETECTED Final   Metapneumovirus NOT DETECTED NOT DETECTED Final   Rhinovirus / Enterovirus DETECTED (A) NOT DETECTED Final   Influenza A NOT DETECTED NOT DETECTED Final   Influenza B NOT DETECTED NOT DETECTED Final   Parainfluenza Virus 1 NOT DETECTED NOT DETECTED Final   Parainfluenza Virus 2 NOT DETECTED NOT DETECTED Final   Parainfluenza Virus 3 NOT DETECTED NOT DETECTED Final   Parainfluenza Virus 4 NOT DETECTED NOT DETECTED Final   Respiratory Syncytial Virus NOT DETECTED NOT DETECTED Final   Bordetella pertussis NOT DETECTED NOT DETECTED Final   Bordetella Parapertussis NOT DETECTED NOT DETECTED Final   Chlamydophila pneumoniae NOT DETECTED NOT DETECTED Final   Mycoplasma pneumoniae NOT DETECTED NOT DETECTED Final    Comment: Performed at [facility], [address], [place], [place] [address]    Radiology Studies: No results found.    Sonoma Firkus T. Marysol Wellnitz [facility]  If 7PM-7AM, please contact night-coverage [email or web address] 09/06/2023, 2:58 PM

## 2023-09-06 NOTE — Progress Notes (Signed)
 Nurse requested Mobility Specialist to perform oxygen saturation test with pt which includes removing pt from oxygen both at rest and while ambulating.  Below are the results from that testing.     Patient Saturations on 3 Liters of oxygen while Ambulating = sp02 83-92%  At end of testing pt left in room on 3  Liters of oxygen.  Reported results to nurse.

## 2023-09-07 ENCOUNTER — Encounter: Admitting: Physical Therapy

## 2023-09-07 DIAGNOSIS — Z8701 Personal history of pneumonia (recurrent): Secondary | ICD-10-CM | POA: Diagnosis not present

## 2023-09-07 DIAGNOSIS — J449 Chronic obstructive pulmonary disease, unspecified: Secondary | ICD-10-CM | POA: Diagnosis not present

## 2023-09-07 DIAGNOSIS — F02A3 Dementia in other diseases classified elsewhere, mild, with mood disturbance: Secondary | ICD-10-CM | POA: Diagnosis not present

## 2023-09-07 DIAGNOSIS — F02A4 Dementia in other diseases classified elsewhere, mild, with anxiety: Secondary | ICD-10-CM | POA: Diagnosis not present

## 2023-09-07 DIAGNOSIS — N393 Stress incontinence (female) (male): Secondary | ICD-10-CM | POA: Diagnosis not present

## 2023-09-07 DIAGNOSIS — Z981 Arthrodesis status: Secondary | ICD-10-CM | POA: Diagnosis not present

## 2023-09-07 DIAGNOSIS — H9190 Unspecified hearing loss, unspecified ear: Secondary | ICD-10-CM | POA: Diagnosis not present

## 2023-09-07 DIAGNOSIS — G928 Other toxic encephalopathy: Secondary | ICD-10-CM | POA: Diagnosis not present

## 2023-09-07 DIAGNOSIS — Z7951 Long term (current) use of inhaled steroids: Secondary | ICD-10-CM | POA: Diagnosis not present

## 2023-09-07 DIAGNOSIS — Z89511 Acquired absence of right leg below knee: Secondary | ICD-10-CM | POA: Diagnosis not present

## 2023-09-07 DIAGNOSIS — G300 Alzheimer's disease with early onset: Secondary | ICD-10-CM | POA: Diagnosis not present

## 2023-09-07 DIAGNOSIS — J9601 Acute respiratory failure with hypoxia: Secondary | ICD-10-CM | POA: Diagnosis not present

## 2023-09-07 DIAGNOSIS — K219 Gastro-esophageal reflux disease without esophagitis: Secondary | ICD-10-CM | POA: Diagnosis not present

## 2023-09-07 DIAGNOSIS — K5730 Diverticulosis of large intestine without perforation or abscess without bleeding: Secondary | ICD-10-CM | POA: Diagnosis not present

## 2023-09-07 DIAGNOSIS — J440 Chronic obstructive pulmonary disease with acute lower respiratory infection: Secondary | ICD-10-CM | POA: Diagnosis not present

## 2023-09-07 DIAGNOSIS — Z9682 Presence of neurostimulator: Secondary | ICD-10-CM | POA: Diagnosis not present

## 2023-09-07 DIAGNOSIS — I700 Atherosclerosis of aorta: Secondary | ICD-10-CM | POA: Diagnosis not present

## 2023-09-07 DIAGNOSIS — Z7952 Long term (current) use of systemic steroids: Secondary | ICD-10-CM | POA: Diagnosis not present

## 2023-09-07 DIAGNOSIS — B348 Other viral infections of unspecified site: Secondary | ICD-10-CM | POA: Diagnosis not present

## 2023-09-07 DIAGNOSIS — M1990 Unspecified osteoarthritis, unspecified site: Secondary | ICD-10-CM | POA: Diagnosis not present

## 2023-09-07 DIAGNOSIS — F32A Depression, unspecified: Secondary | ICD-10-CM | POA: Diagnosis not present

## 2023-09-07 DIAGNOSIS — J189 Pneumonia, unspecified organism: Secondary | ICD-10-CM | POA: Diagnosis not present

## 2023-09-07 DIAGNOSIS — E785 Hyperlipidemia, unspecified: Secondary | ICD-10-CM | POA: Diagnosis not present

## 2023-09-07 DIAGNOSIS — I10 Essential (primary) hypertension: Secondary | ICD-10-CM | POA: Diagnosis not present

## 2023-09-07 DIAGNOSIS — Z87891 Personal history of nicotine dependence: Secondary | ICD-10-CM | POA: Diagnosis not present

## 2023-09-07 DIAGNOSIS — Z87440 Personal history of urinary (tract) infections: Secondary | ICD-10-CM | POA: Diagnosis not present

## 2023-09-11 ENCOUNTER — Other Ambulatory Visit: Payer: Self-pay

## 2023-09-11 ENCOUNTER — Inpatient Hospital Stay (HOSPITAL_COMMUNITY)

## 2023-09-11 ENCOUNTER — Encounter (HOSPITAL_COMMUNITY): Payer: Self-pay | Admitting: Emergency Medicine

## 2023-09-11 ENCOUNTER — Inpatient Hospital Stay (HOSPITAL_COMMUNITY)
Admission: EM | Admit: 2023-09-11 | Discharge: 2023-09-30 | DRG: 193 | Disposition: A | Discharge status: Skilled Nursing Facility | Attending: Internal Medicine | Admitting: Internal Medicine

## 2023-09-11 ENCOUNTER — Emergency Department (HOSPITAL_COMMUNITY)

## 2023-09-11 ENCOUNTER — Encounter: Admitting: Physical Therapy

## 2023-09-11 DIAGNOSIS — D539 Nutritional anemia, unspecified: Secondary | ICD-10-CM | POA: Diagnosis present

## 2023-09-11 DIAGNOSIS — R6520 Severe sepsis without septic shock: Secondary | ICD-10-CM | POA: Diagnosis not present

## 2023-09-11 DIAGNOSIS — I739 Peripheral vascular disease, unspecified: Secondary | ICD-10-CM | POA: Diagnosis present

## 2023-09-11 DIAGNOSIS — M879 Osteonecrosis, unspecified: Secondary | ICD-10-CM | POA: Diagnosis present

## 2023-09-11 DIAGNOSIS — I110 Hypertensive heart disease with heart failure: Secondary | ICD-10-CM | POA: Diagnosis present

## 2023-09-11 DIAGNOSIS — G309 Alzheimer's disease, unspecified: Secondary | ICD-10-CM | POA: Diagnosis present

## 2023-09-11 DIAGNOSIS — I517 Cardiomegaly: Secondary | ICD-10-CM | POA: Diagnosis not present

## 2023-09-11 DIAGNOSIS — A4189 Other specified sepsis: Secondary | ICD-10-CM | POA: Diagnosis not present

## 2023-09-11 DIAGNOSIS — R079 Chest pain, unspecified: Secondary | ICD-10-CM | POA: Diagnosis present

## 2023-09-11 DIAGNOSIS — F0280 Dementia in other diseases classified elsewhere without behavioral disturbance: Secondary | ICD-10-CM | POA: Diagnosis present

## 2023-09-11 DIAGNOSIS — Z8249 Family history of ischemic heart disease and other diseases of the circulatory system: Secondary | ICD-10-CM

## 2023-09-11 DIAGNOSIS — Z981 Arthrodesis status: Secondary | ICD-10-CM

## 2023-09-11 DIAGNOSIS — R531 Weakness: Secondary | ICD-10-CM | POA: Diagnosis not present

## 2023-09-11 DIAGNOSIS — I5189 Other ill-defined heart diseases: Secondary | ICD-10-CM

## 2023-09-11 DIAGNOSIS — J9600 Acute respiratory failure, unspecified whether with hypoxia or hypercapnia: Secondary | ICD-10-CM | POA: Diagnosis not present

## 2023-09-11 DIAGNOSIS — Z7901 Long term (current) use of anticoagulants: Secondary | ICD-10-CM

## 2023-09-11 DIAGNOSIS — R0600 Dyspnea, unspecified: Secondary | ICD-10-CM | POA: Diagnosis not present

## 2023-09-11 DIAGNOSIS — H9190 Unspecified hearing loss, unspecified ear: Secondary | ICD-10-CM | POA: Diagnosis present

## 2023-09-11 DIAGNOSIS — A419 Sepsis, unspecified organism: Secondary | ICD-10-CM | POA: Diagnosis present

## 2023-09-11 DIAGNOSIS — J9601 Acute respiratory failure with hypoxia: Principal | ICD-10-CM | POA: Diagnosis present

## 2023-09-11 DIAGNOSIS — I2699 Other pulmonary embolism without acute cor pulmonale: Secondary | ICD-10-CM | POA: Diagnosis not present

## 2023-09-11 DIAGNOSIS — F02A4 Dementia in other diseases classified elsewhere, mild, with anxiety: Secondary | ICD-10-CM | POA: Diagnosis present

## 2023-09-11 DIAGNOSIS — I82452 Acute embolism and thrombosis of left peroneal vein: Secondary | ICD-10-CM | POA: Diagnosis present

## 2023-09-11 DIAGNOSIS — F32A Depression, unspecified: Secondary | ICD-10-CM | POA: Diagnosis present

## 2023-09-11 DIAGNOSIS — Z87891 Personal history of nicotine dependence: Secondary | ICD-10-CM

## 2023-09-11 DIAGNOSIS — R9389 Abnormal findings on diagnostic imaging of other specified body structures: Secondary | ICD-10-CM | POA: Diagnosis not present

## 2023-09-11 DIAGNOSIS — I959 Hypotension, unspecified: Secondary | ICD-10-CM | POA: Diagnosis not present

## 2023-09-11 DIAGNOSIS — F02A3 Dementia in other diseases classified elsewhere, mild, with mood disturbance: Secondary | ICD-10-CM | POA: Diagnosis present

## 2023-09-11 DIAGNOSIS — I2609 Other pulmonary embolism with acute cor pulmonale: Secondary | ICD-10-CM

## 2023-09-11 DIAGNOSIS — E785 Hyperlipidemia, unspecified: Secondary | ICD-10-CM | POA: Diagnosis present

## 2023-09-11 DIAGNOSIS — J449 Chronic obstructive pulmonary disease, unspecified: Secondary | ICD-10-CM | POA: Diagnosis not present

## 2023-09-11 DIAGNOSIS — F419 Anxiety disorder, unspecified: Secondary | ICD-10-CM | POA: Diagnosis not present

## 2023-09-11 DIAGNOSIS — Z888 Allergy status to other drugs, medicaments and biological substances status: Secondary | ICD-10-CM

## 2023-09-11 DIAGNOSIS — J440 Chronic obstructive pulmonary disease with acute lower respiratory infection: Secondary | ICD-10-CM | POA: Diagnosis present

## 2023-09-11 DIAGNOSIS — J80 Acute respiratory distress syndrome: Secondary | ICD-10-CM | POA: Diagnosis present

## 2023-09-11 DIAGNOSIS — Z79899 Other long term (current) drug therapy: Secondary | ICD-10-CM

## 2023-09-11 DIAGNOSIS — R918 Other nonspecific abnormal finding of lung field: Secondary | ICD-10-CM | POA: Diagnosis not present

## 2023-09-11 DIAGNOSIS — Z961 Presence of intraocular lens: Secondary | ICD-10-CM | POA: Diagnosis present

## 2023-09-11 DIAGNOSIS — R0689 Other abnormalities of breathing: Secondary | ICD-10-CM | POA: Diagnosis not present

## 2023-09-11 DIAGNOSIS — R0602 Shortness of breath: Secondary | ICD-10-CM | POA: Diagnosis not present

## 2023-09-11 DIAGNOSIS — J45998 Other asthma: Secondary | ICD-10-CM | POA: Diagnosis not present

## 2023-09-11 DIAGNOSIS — F411 Generalized anxiety disorder: Secondary | ICD-10-CM | POA: Diagnosis not present

## 2023-09-11 DIAGNOSIS — K219 Gastro-esophageal reflux disease without esophagitis: Secondary | ICD-10-CM | POA: Diagnosis not present

## 2023-09-11 DIAGNOSIS — I2510 Atherosclerotic heart disease of native coronary artery without angina pectoris: Secondary | ICD-10-CM | POA: Diagnosis not present

## 2023-09-11 DIAGNOSIS — I5032 Chronic diastolic (congestive) heart failure: Secondary | ICD-10-CM | POA: Diagnosis present

## 2023-09-11 DIAGNOSIS — D71 Functional disorders of polymorphonuclear neutrophils: Secondary | ICD-10-CM | POA: Diagnosis not present

## 2023-09-11 DIAGNOSIS — J189 Pneumonia, unspecified organism: Secondary | ICD-10-CM | POA: Diagnosis not present

## 2023-09-11 DIAGNOSIS — Z9049 Acquired absence of other specified parts of digestive tract: Secondary | ICD-10-CM

## 2023-09-11 DIAGNOSIS — I10 Essential (primary) hypertension: Secondary | ICD-10-CM | POA: Diagnosis not present

## 2023-09-11 DIAGNOSIS — J984 Other disorders of lung: Secondary | ICD-10-CM | POA: Diagnosis not present

## 2023-09-11 DIAGNOSIS — Z7401 Bed confinement status: Secondary | ICD-10-CM | POA: Diagnosis not present

## 2023-09-11 DIAGNOSIS — Z9851 Tubal ligation status: Secondary | ICD-10-CM

## 2023-09-11 DIAGNOSIS — G4709 Other insomnia: Secondary | ICD-10-CM | POA: Diagnosis not present

## 2023-09-11 DIAGNOSIS — Z1152 Encounter for screening for COVID-19: Secondary | ICD-10-CM | POA: Diagnosis not present

## 2023-09-11 DIAGNOSIS — E782 Mixed hyperlipidemia: Secondary | ICD-10-CM | POA: Diagnosis not present

## 2023-09-11 DIAGNOSIS — Z89511 Acquired absence of right leg below knee: Secondary | ICD-10-CM | POA: Diagnosis not present

## 2023-09-11 DIAGNOSIS — R0989 Other specified symptoms and signs involving the circulatory and respiratory systems: Secondary | ICD-10-CM | POA: Diagnosis not present

## 2023-09-11 DIAGNOSIS — D649 Anemia, unspecified: Secondary | ICD-10-CM | POA: Diagnosis not present

## 2023-09-11 DIAGNOSIS — Z7951 Long term (current) use of inhaled steroids: Secondary | ICD-10-CM

## 2023-09-11 DIAGNOSIS — I2693 Single subsegmental pulmonary embolism without acute cor pulmonale: Secondary | ICD-10-CM | POA: Diagnosis present

## 2023-09-11 DIAGNOSIS — J9611 Chronic respiratory failure with hypoxia: Secondary | ICD-10-CM | POA: Diagnosis present

## 2023-09-11 DIAGNOSIS — K5909 Other constipation: Secondary | ICD-10-CM | POA: Diagnosis not present

## 2023-09-11 DIAGNOSIS — Z86711 Personal history of pulmonary embolism: Secondary | ICD-10-CM | POA: Diagnosis not present

## 2023-09-11 DIAGNOSIS — R000 Tachycardia, unspecified: Secondary | ICD-10-CM | POA: Diagnosis not present

## 2023-09-11 DIAGNOSIS — Z9842 Cataract extraction status, left eye: Secondary | ICD-10-CM

## 2023-09-11 DIAGNOSIS — J9690 Respiratory failure, unspecified, unspecified whether with hypoxia or hypercapnia: Secondary | ICD-10-CM | POA: Diagnosis not present

## 2023-09-11 LAB — I-STAT VENOUS BLOOD GAS, ED
Acid-Base Excess: 5 mmol/L — ABNORMAL HIGH (ref 0.0–2.0)
Bicarbonate: 27.9 mmol/L (ref 20.0–28.0)
Calcium, Ion: 1.02 mmol/L — ABNORMAL LOW (ref 1.15–1.40)
HCT: 34 % — ABNORMAL LOW (ref 36.0–46.0)
Hemoglobin: 11.6 g/dL — ABNORMAL LOW (ref 12.0–15.0)
O2 Saturation: 85 %
Potassium: 4.5 mmol/L (ref 3.5–5.1)
Sodium: 136 mmol/L (ref 135–145)
TCO2: 29 mmol/L (ref 22–32)
pCO2, Ven: 35.4 mmHg — ABNORMAL LOW (ref 44–60)
pH, Ven: 7.504 — ABNORMAL HIGH (ref 7.25–7.43)
pO2, Ven: 45 mmHg (ref 32–45)

## 2023-09-11 LAB — CBC WITH DIFFERENTIAL/PLATELET
Abs Immature Granulocytes: 0.12 10*3/uL — ABNORMAL HIGH (ref 0.00–0.07)
Basophils Absolute: 0.1 10*3/uL (ref 0.0–0.1)
Basophils Relative: 0 %
Eosinophils Absolute: 0.3 10*3/uL (ref 0.0–0.5)
Eosinophils Relative: 2 %
HCT: 34.5 % — ABNORMAL LOW (ref 36.0–46.0)
Hemoglobin: 10.7 g/dL — ABNORMAL LOW (ref 12.0–15.0)
Immature Granulocytes: 1 %
Lymphocytes Relative: 5 %
Lymphs Abs: 1 10*3/uL (ref 0.7–4.0)
MCH: 31.1 pg (ref 26.0–34.0)
MCHC: 31 g/dL (ref 30.0–36.0)
MCV: 100.3 fL — ABNORMAL HIGH (ref 80.0–100.0)
Monocytes Absolute: 0.7 10*3/uL (ref 0.1–1.0)
Monocytes Relative: 4 %
Neutro Abs: 16 10*3/uL — ABNORMAL HIGH (ref 1.7–7.7)
Neutrophils Relative %: 88 %
Platelets: 297 10*3/uL (ref 150–400)
RBC: 3.44 MIL/uL — ABNORMAL LOW (ref 3.87–5.11)
RDW: 14.1 % (ref 11.5–15.5)
WBC: 18.2 10*3/uL — ABNORMAL HIGH (ref 4.0–10.5)
nRBC: 0 % (ref 0.0–0.2)

## 2023-09-11 LAB — COMPREHENSIVE METABOLIC PANEL WITH GFR
ALT: 35 U/L (ref 0–44)
AST: 29 U/L (ref 15–41)
Albumin: 2.4 g/dL — ABNORMAL LOW (ref 3.5–5.0)
Alkaline Phosphatase: 71 U/L (ref 38–126)
Anion gap: 11 (ref 5–15)
BUN: 12 mg/dL (ref 8–23)
CO2: 24 mmol/L (ref 22–32)
Calcium: 8.3 mg/dL — ABNORMAL LOW (ref 8.9–10.3)
Chloride: 101 mmol/L (ref 98–111)
Creatinine, Ser: 0.77 mg/dL (ref 0.44–1.00)
GFR, Estimated: >60 mL/min (ref >60)
Glucose, Bld: 98 mg/dL (ref 70–99)
Potassium: 4.6 mmol/L (ref 3.5–5.1)
Sodium: 136 mmol/L (ref 135–145)
Total Bilirubin: 0.9 mg/dL (ref 0.0–1.2)
Total Protein: 6 g/dL — ABNORMAL LOW (ref 6.5–8.1)

## 2023-09-11 LAB — URINALYSIS, W/ REFLEX TO CULTURE (INFECTION SUSPECTED)
Bilirubin Urine: NEGATIVE
Glucose, UA: NEGATIVE mg/dL
Hgb urine dipstick: NEGATIVE
Ketones, ur: 20 mg/dL — AB
Leukocytes,Ua: NEGATIVE
Nitrite: NEGATIVE
Protein, ur: NEGATIVE mg/dL
Specific Gravity, Urine: 1.018 (ref 1.005–1.030)
pH: 5 (ref 5.0–8.0)

## 2023-09-11 LAB — RESP PANEL BY RT-PCR (RSV, FLU A&B, COVID)  RVPGX2
Influenza A by PCR: NEGATIVE
Influenza B by PCR: NEGATIVE
Resp Syncytial Virus by PCR: NEGATIVE
SARS Coronavirus 2 by RT PCR: NEGATIVE

## 2023-09-11 LAB — I-STAT CG4 LACTIC ACID, ED
Lactic Acid, Venous: 1.4 mmol/L (ref 0.5–1.9)
Lactic Acid, Venous: 2 mmol/L (ref 0.5–1.9)

## 2023-09-11 LAB — TROPONIN I (HIGH SENSITIVITY)
Troponin I (High Sensitivity): 25 ng/L — ABNORMAL HIGH (ref <18)
Troponin I (High Sensitivity): 36 ng/L — ABNORMAL HIGH (ref <18)
Troponin I (High Sensitivity): 26 ng/L — ABNORMAL HIGH (ref <18)

## 2023-09-11 LAB — PROTIME-INR
INR: 1.2 (ref 0.8–1.2)
Prothrombin Time: 15.1 s (ref 11.4–15.2)

## 2023-09-11 LAB — SEDIMENTATION RATE: Sed Rate: 52 mm/h — ABNORMAL HIGH (ref 0–22)

## 2023-09-11 LAB — MRSA NEXT GEN BY PCR, NASAL: MRSA by PCR Next Gen: NOT DETECTED

## 2023-09-11 LAB — GLUCOSE, CAPILLARY: Glucose-Capillary: 132 mg/dL — ABNORMAL HIGH (ref 70–99)

## 2023-09-11 LAB — HEPARIN LEVEL (UNFRACTIONATED): Heparin Unfractionated: 0.72 [IU]/mL — ABNORMAL HIGH (ref 0.30–0.70)

## 2023-09-11 LAB — STREP PNEUMONIAE URINARY ANTIGEN: Strep Pneumo Urinary Antigen: NEGATIVE

## 2023-09-11 LAB — BRAIN NATRIURETIC PEPTIDE: B Natriuretic Peptide: 469.6 pg/mL — ABNORMAL HIGH (ref 0.0–100.0)

## 2023-09-11 LAB — PROCALCITONIN: Procalcitonin: 0.13 ng/mL

## 2023-09-11 MED ORDER — PANTOPRAZOLE SODIUM 40 MG IV SOLR
40.0000 mg | INTRAVENOUS | Status: DC
  Administered 2023-09-11 – 2023-09-12 (×2): 40 mg via INTRAVENOUS
  Filled 2023-09-11 (×2): qty 10

## 2023-09-11 MED ORDER — HEPARIN BOLUS VIA INFUSION
3000.0000 [IU] | Freq: Once | INTRAVENOUS | Status: AC
  Administered 2023-09-11: 3000 [IU] via INTRAVENOUS
  Filled 2023-09-11: qty 3000

## 2023-09-11 MED ORDER — ALBUMIN HUMAN 25 % IV SOLN
25.0000 g | Freq: Four times a day (QID) | INTRAVENOUS | Status: AC
  Administered 2023-09-11 – 2023-09-12 (×4): 25 g via INTRAVENOUS
  Filled 2023-09-11 (×4): qty 100

## 2023-09-11 MED ORDER — LACTATED RINGERS IV BOLUS
1000.0000 mL | Freq: Once | INTRAVENOUS | Status: AC
  Administered 2023-09-11: 1000 mL via INTRAVENOUS

## 2023-09-11 MED ORDER — PIPERACILLIN-TAZOBACTAM 3.375 G IVPB
3.3750 g | Freq: Three times a day (TID) | INTRAVENOUS | Status: DC
  Administered 2023-09-11 – 2023-09-16 (×14): 3.375 g via INTRAVENOUS
  Filled 2023-09-11 (×13): qty 50

## 2023-09-11 MED ORDER — HEPARIN (PORCINE) 25000 UT/250ML-% IV SOLN
1300.0000 [IU]/h | INTRAVENOUS | Status: DC
  Administered 2023-09-11 – 2023-09-12 (×2): 1200 [IU]/h via INTRAVENOUS
  Administered 2023-09-13: 1300 [IU]/h via INTRAVENOUS
  Filled 2023-09-11 (×3): qty 250

## 2023-09-11 MED ORDER — SODIUM CHLORIDE 0.9 % IV SOLN
2.0000 g | Freq: Once | INTRAVENOUS | Status: AC
  Administered 2023-09-11: 2 g via INTRAVENOUS
  Filled 2023-09-11: qty 12.5

## 2023-09-11 MED ORDER — LOSARTAN POTASSIUM 50 MG PO TABS: 50.0000 mg | ORAL_TABLET | Freq: Every day | ORAL | Status: DC

## 2023-09-11 MED ORDER — FUROSEMIDE 10 MG/ML IJ SOLN
40.0000 mg | Freq: Four times a day (QID) | INTRAMUSCULAR | Status: AC
  Administered 2023-09-11 (×2): 40 mg via INTRAVENOUS
  Filled 2023-09-11: qty 4

## 2023-09-11 MED ORDER — ACETAMINOPHEN 325 MG PO TABS
650.0000 mg | ORAL_TABLET | Freq: Four times a day (QID) | ORAL | Status: DC | PRN
  Administered 2023-09-11 – 2023-09-25 (×13): 650 mg via ORAL
  Filled 2023-09-11 (×13): qty 2

## 2023-09-11 MED ORDER — LACTATED RINGERS IV SOLN: INTRAVENOUS | Status: DC

## 2023-09-11 MED ORDER — GUAIFENESIN ER 600 MG PO TB12
600.0000 mg | ORAL_TABLET | Freq: Two times a day (BID) | ORAL | Status: DC
  Administered 2023-09-11 – 2023-09-30 (×38): 600 mg via ORAL
  Filled 2023-09-11 (×38): qty 1

## 2023-09-11 MED ORDER — VANCOMYCIN HCL 750 MG/150ML IV SOLN
750.0000 mg | Freq: Two times a day (BID) | INTRAVENOUS | Status: DC
  Administered 2023-09-12 (×2): 750 mg via INTRAVENOUS
  Filled 2023-09-11 (×4): qty 150

## 2023-09-11 MED ORDER — FLUOXETINE HCL 20 MG PO CAPS
20.0000 mg | ORAL_CAPSULE | Freq: Every day | ORAL | Status: DC
  Administered 2023-09-12 – 2023-09-30 (×19): 20 mg via ORAL
  Filled 2023-09-11 (×19): qty 1

## 2023-09-11 MED ORDER — IPRATROPIUM-ALBUTEROL 0.5-2.5 (3) MG/3ML IN SOLN
3.0000 mL | Freq: Four times a day (QID) | RESPIRATORY_TRACT | Status: DC
  Administered 2023-09-11 – 2023-09-13 (×9): 3 mL via RESPIRATORY_TRACT
  Filled 2023-09-11 (×9): qty 3

## 2023-09-11 MED ORDER — ONDANSETRON HCL 4 MG/2ML IJ SOLN
4.0000 mg | Freq: Four times a day (QID) | INTRAMUSCULAR | Status: DC | PRN
Start: 2023-09-11 — End: 2023-09-30

## 2023-09-11 MED ORDER — ARFORMOTEROL TARTRATE 15 MCG/2ML IN NEBU
15.0000 ug | INHALATION_SOLUTION | Freq: Two times a day (BID) | RESPIRATORY_TRACT | Status: DC
  Administered 2023-09-11 – 2023-09-30 (×37): 15 ug via RESPIRATORY_TRACT
  Filled 2023-09-11 (×38): qty 2

## 2023-09-11 MED ORDER — PIPERACILLIN-TAZOBACTAM 3.375 G IVPB 30 MIN
3.3750 g | Freq: Once | INTRAVENOUS | Status: AC
  Administered 2023-09-11: 3.375 g via INTRAVENOUS
  Filled 2023-09-11: qty 50

## 2023-09-11 MED ORDER — ROSUVASTATIN CALCIUM 5 MG PO TABS
10.0000 mg | ORAL_TABLET | Freq: Every day | ORAL | Status: DC
  Administered 2023-09-11 – 2023-09-29 (×19): 10 mg via ORAL
  Filled 2023-09-11 (×10): qty 2
  Filled 2023-09-11: qty 1
  Filled 2023-09-11 (×3): qty 2
  Filled 2023-09-11: qty 1
  Filled 2023-09-11 (×4): qty 2

## 2023-09-11 MED ORDER — IOHEXOL 350 MG/ML SOLN
75.0000 mL | Freq: Once | INTRAVENOUS | Status: AC | PRN
  Administered 2023-09-11: 75 mL via INTRAVENOUS

## 2023-09-11 MED ORDER — BUDESONIDE 0.5 MG/2ML IN SUSP
0.5000 mg | Freq: Two times a day (BID) | RESPIRATORY_TRACT | Status: DC
  Administered 2023-09-11 – 2023-09-30 (×37): 0.5 mg via RESPIRATORY_TRACT
  Filled 2023-09-11 (×38): qty 2

## 2023-09-11 MED ORDER — METHYLPREDNISOLONE SODIUM SUCC 125 MG IJ SOLR
120.0000 mg | INTRAMUSCULAR | Status: DC
  Administered 2023-09-11: 120 mg via INTRAVENOUS
  Filled 2023-09-11: qty 2

## 2023-09-11 MED ORDER — ALBUTEROL SULFATE (2.5 MG/3ML) 0.083% IN NEBU: 2.5000 mg | INHALATION_SOLUTION | RESPIRATORY_TRACT | Status: DC | PRN

## 2023-09-11 MED ORDER — VANCOMYCIN HCL 1500 MG/300ML IV SOLN
1500.0000 mg | Freq: Once | INTRAVENOUS | Status: AC
  Administered 2023-09-11: 1500 mg via INTRAVENOUS
  Filled 2023-09-11: qty 300

## 2023-09-11 MED ORDER — SODIUM CHLORIDE 0.9 % IV SOLN
500.0000 mg | INTRAVENOUS | Status: AC
  Administered 2023-09-11 – 2023-09-13 (×3): 500 mg via INTRAVENOUS
  Filled 2023-09-11 (×3): qty 5

## 2023-09-11 MED ORDER — ENOXAPARIN SODIUM 40 MG/0.4ML IJ SOSY
40.0000 mg | PREFILLED_SYRINGE | INTRAMUSCULAR | Status: DC
  Administered 2023-09-11: 40 mg via SUBCUTANEOUS
  Filled 2023-09-11: qty 0.4

## 2023-09-11 MED ORDER — ACETAMINOPHEN 650 MG RE SUPP: 650.0000 mg | Freq: Four times a day (QID) | RECTAL | Status: DC | PRN

## 2023-09-11 MED ORDER — DONEPEZIL HCL 10 MG PO TABS
10.0000 mg | ORAL_TABLET | Freq: Every day | ORAL | Status: DC
  Administered 2023-09-11 – 2023-09-29 (×19): 10 mg via ORAL
  Filled 2023-09-11 (×11): qty 1
  Filled 2023-09-11: qty 2
  Filled 2023-09-11 (×4): qty 1
  Filled 2023-09-11: qty 2
  Filled 2023-09-11 (×2): qty 1

## 2023-09-11 MED ORDER — SODIUM CHLORIDE 0.9% FLUSH
3.0000 mL | Freq: Two times a day (BID) | INTRAVENOUS | Status: DC
  Administered 2023-09-11: 3 mL via INTRAVENOUS
  Administered 2023-09-12: 6 mL via INTRAVENOUS
  Administered 2023-09-12 – 2023-09-30 (×35): 3 mL via INTRAVENOUS

## 2023-09-11 MED ORDER — VANCOMYCIN HCL IN DEXTROSE 1-5 GM/200ML-% IV SOLN: 1000.0000 mg | Freq: Once | INTRAVENOUS | Status: DC

## 2023-09-11 MED ORDER — IPRATROPIUM-ALBUTEROL 0.5-2.5 (3) MG/3ML IN SOLN
3.0000 mL | Freq: Once | RESPIRATORY_TRACT | Status: AC
  Administered 2023-09-11: 3 mL via RESPIRATORY_TRACT
  Filled 2023-09-11: qty 3

## 2023-09-11 MED ORDER — ONDANSETRON HCL 4 MG PO TABS: 4.0000 mg | ORAL_TABLET | Freq: Four times a day (QID) | ORAL | Status: DC | PRN

## 2023-09-11 MED ORDER — SODIUM CHLORIDE 0.9 % IV SOLN
2.0000 g | Freq: Three times a day (TID) | INTRAVENOUS | Status: DC
  Administered 2023-09-11: 2 g via INTRAVENOUS
  Filled 2023-09-11: qty 12.5

## 2023-09-11 NOTE — Progress Notes (Signed)
 Pharmacy Antibiotic Note  Shelly [name] is a 73 y.o. female admitted on 09/11/2023 with pneumonia.  Pharmacy has been consulted for vancomycin and cefepime dosing.  Plan: Vancomcyin 750mg  q12 hours (eAUC 531, scr 0.77, Vd 0.72) Cefepime 2G IV q8 hours Monitor clinical progression and C/S  Height: 5\' 5"  (165.1 cm) Weight: 74.9 kg (165 lb 2 oz) IBW/kg (Calculated) : 57  Temp (24hrs), Avg:98.1 F (36.7 C), Min:98.1 F (36.7 C), Max:98.1 F (36.7 C)  Recent Labs  Lab 09/05/23 0330 09/06/23 0404 09/11/23 0541 09/11/23 0552 09/11/23 0733  WBC 16.0* 17.8* 18.2*  --   --   CREATININE 0.66 0.73 0.77  --   --   LATICACIDVEN  --   --   --  1.4 2.0*    Estimated Creatinine Clearance: 62.5 mL/min (by C-G formula based on SCr of 0.77 mg/dL).    Allergies  Allergen Reactions   Delsym [Dextromethorphan] Other (See Comments)    Cognitive impairment     Thank you for allowing pharmacy to be a part of this patient's care.  Toniann Fail Andrick Rust 09/11/2023 9:27 AM

## 2023-09-11 NOTE — ED Notes (Addendum)
 RT at bedside. Pt placed back on bipap.

## 2023-09-11 NOTE — Progress Notes (Signed)
 Patient transported from ED#35 to 3M09 with no complications noted.

## 2023-09-11 NOTE — Progress Notes (Signed)
 PHARMACY - ANTICOAGULATION CONSULT NOTE  Pharmacy Consult for heparin Indication: pulmonary embolus  Allergies  Allergen Reactions   Delsym [Dextromethorphan] Other (See Comments)    Cognitive impairment    Patient Measurements: Height: 5\' 5"  (165.1 cm) Weight: 74.9 kg (165 lb 2 oz) IBW/kg (Calculated) : 57 HEPARIN DW (KG): 72.3  Vital Signs: Temp: 98 F (36.7 C) (04/07 1141) Temp Source: Axillary (04/07 1141) BP: 94/59 (04/07 1300) Pulse Rate: 101 (04/07 1300)  Labs: Recent Labs    09/11/23 0541 09/11/23 0550 09/11/23 0719 09/11/23 1028  HGB 10.7* 11.6*  --   --   HCT 34.5* 34.0*  --   --   PLT 297  --   --   --   LABPROT 15.1  --   --   --   INR 1.2  --   --   --   CREATININE 0.77  --   --   --   TROPONINIHS 25*  --  36* 26*    Estimated Creatinine Clearance: 62.5 mL/min (by C-G formula based on SCr of 0.77 mg/dL).   Medical History: Past Medical History:  Diagnosis Date   Anxiety    Arthritis    Asthma    Avascular necrosis of talus (HCC)    Cancer (HCC)    vulva pre cancer had surgery   Depression    Gait abnormality 03/20/2017   GERD (gastroesophageal reflux disease)    Hearing loss    "very minor"   History of pneumonia    Hyperlipidemia    Hypertension    Leg edema    left   Memory difficulty 03/20/2017   PONV (postoperative nausea and vomiting)    Stress incontinence     Assessment: 2 YOF presenting with SOB, CT angio chest with scattered segmental PE, mild clot burden, she is not on anticoagulation PTA, chronic anemia stable, plts wnl - lovenox DVT ppx was previously ordered and administered @1142 , will give augmented heparin bolus d/t this  Goal of Therapy:  Heparin level 0.3-0.7 units/ml Monitor platelets by anticoagulation protocol: Yes   Plan:  D/c lovenox Heparin 3000 units IV x 1, and gtt at 1200 units/hr F/u 8 hour heparin level F/u long term [facility] plan  Daylene Posey, PharmD, [facility] Clinical Pharmacist ED Pharmacist Phone  # [phone] 09/11/2023 2:12 PM

## 2023-09-11 NOTE — ED Notes (Signed)
 Pt flagged at 2.0 kendall m.rn notified by at

## 2023-09-11 NOTE — ED Notes (Signed)
 Message sent to main pharmacy to send patient's vancomycin to orange zone tube station in the ED.

## 2023-09-11 NOTE — ED Notes (Signed)
 RT contacted to administer order nebulizer treatments on BIPAP.

## 2023-09-11 NOTE — ED Notes (Signed)
 Sats dropped to 71 when removing mask to take oral medication, BIPAP replaced immediately.

## 2023-09-11 NOTE — ED Triage Notes (Addendum)
 Pt BIB EMS from home with c/o SHOB. Wears 4lpm [place], O2 was 60% on ems arrival. Dx with aspiration pneumonia recently. Placed on 12lpm NRB, O2 increased to 94%.

## 2023-09-11 NOTE — ED Notes (Signed)
 RT called to administer ordered breathing treatment through BIPAP.

## 2023-09-11 NOTE — ED Notes (Signed)
 Pt 75% on 10lpm NRB, increased to 15lpm NRB, O2 90%.

## 2023-09-11 NOTE — ED Notes (Signed)
 Patient transported to CT with RT and Gaffer.

## 2023-09-11 NOTE — Progress Notes (Signed)
   09/11/23 0556  BiPAP/CPAP/SIPAP  $ Non-Invasive Ventilator  Non-Invasive Vent Initial  $ Face Mask Small Yes  BiPAP/CPAP/SIPAP Pt Type Adult  BiPAP/CPAP/SIPAP SERVO  Mask Type Full face mask  Mask Size Small  Respiratory Rate 37 breaths/min  IPAP 10 cmH20  EPAP 5 cmH2O  FiO2 (%) 60 %  Minute Ventilation 23.4  Leak 31  Peak Inspiratory Pressure (PIP) 10  Tidal Volume (Vt) 463  Patient Home Machine No  Patient Home Mask No  Patient Home Tubing No  Auto Titrate Yes  Device Plugged into RED Power Outlet Yes  BiPAP/CPAP /SiPAP Vitals  Bilateral Breath Sounds Clear;Diminished

## 2023-09-11 NOTE — Progress Notes (Signed)
 IVT consult placed for 2nd PIV. Patient has 1 PIV placed by ED staff. Meds ordered are compatible. Instructed to place new consult if additional access is needed and ED staff is unable to place.  Tunisha Ruland Loyola Mast, RN

## 2023-09-11 NOTE — Progress Notes (Addendum)
 CT angiogram of the chest revealed concerns for scattered left-sided pulmonary emboli with mild clot burden, and changing parenchymal lung opacities with areas of tissue and septal thickening left lung worse than the right.  Orders for this to transition to heparin drip.  Patient currently on BiPAP.

## 2023-09-11 NOTE — Progress Notes (Signed)
 Patient transported from ED #35 to CT and back to ED #35 with no complication noted.

## 2023-09-11 NOTE — ED Notes (Signed)
 Lactic acid flagged [facility] m.rn notified by at

## 2023-09-11 NOTE — ED Notes (Signed)
 Pt on 6L [place], oxygen saturation dipped to 75 percent. RT called to switch to high flow [place]. NRB placed on pt at 15L until RT able to switch to high flow.

## 2023-09-11 NOTE — ED Provider Notes (Signed)
 [facility] EMERGENCY DEPARTMENT AT [facility] Provider Note   CSN: [id number] Arrival date & time: 09/11/23  4098     History  Chief Complaint  Patient presents with   Shortness of Breath    Shelly [name] is a 73 y.o. female.   Shortness of Breath   73 year old female presents emergency department close of cough, shortness of breath.  Patient recently discharged from the hospital on 09/11/23 on 3 and half to 4 L at home.  States that she has been taking medications as prescribed as well as using at home oxygen.  Over the past 2 to 3 days, shortness of breath has worsened.  Denies any fevers/chills.  Called EMS today and was found to be 60% on her 4 L nasal cannula.  Was placed on 15 L nonrebreather with improvement in O2 sats 94%.  Patient denies any abdominal pain, nausea, vomiting, urinary symptoms, change in bowel habits.  Past medical history significant for lumbar radiculopathy, GERD, hyperlipidemia, cough.  Asthma, hypertension, malignancy, asthma  Home Medications Prior to Admission medications   Medication Sig Start Date End Date Taking? Authorizing Provider  Acetylcarnitine HCl (ACETYL L-CARNITINE PO) Take 1 tablet by mouth daily.    [provider]  acyclovir cream (ZOVIRAX) 5 % Apply 1 Application topically 2 (two) times daily as needed (cold sore).    [provider]  albuterol (PROAIR HFA) 108 (90 Base) MCG/ACT inhaler 2 puffs every 4 hours as needed only  if your can't catch your breath 05/03/23   Nyoka Cowden, MD  APPLE CIDER VINEGAR PO Take 1 tablet by mouth daily.    [provider]  budesonide-formoterol (SYMBICORT) 160-4.5 MCG/ACT inhaler Inhale 2 puffs into the lungs in the morning and at bedtime. 09/06/23   Almon Hercules, MD  buPROPion (WELLBUTRIN XL) 300 MG 24 hr tablet Take 300 mg by mouth daily.    [provider]  calcium carbonate (OSCAL) 1500 (600 Ca) MG TABS tablet Take 600 mg of elemental calcium by mouth  at bedtime.    [provider]  denosumab (PROLIA) 60 MG/ML SOLN injection Inject 60 mg into the skin every 6 (six) months.     [provider]  donepezil (ARICEPT) 10 MG tablet Take 1 tablet (10 mg total) by mouth at bedtime. 08/24/23   Glean Salvo, NP  FLUoxetine (PROZAC) 20 MG capsule Take 20 mg by mouth daily.    [provider]  FOLIC ACID-VIT B6-VIT B12 PO Take 1 tablet by mouth daily.    [provider]  guaiFENesin (MUCINEX) 600 MG 12 hr tablet Take 1 tablet (600 mg total) by mouth 2 (two) times daily for 5 days. 09/06/23 09/11/23  Almon Hercules, MD  ipratropium-albuterol (DUONEB) 0.5-2.5 (3) MG/3ML SOLN Take 3 mLs by nebulization every 6 (six) hours as needed (Shortness of breath, cough and/or wheeze). Use this or albuterol inhaler but not both. 09/06/23   Almon Hercules, MD  Lactobacillus (ACIDOPHILUS PO) Take 1 tablet by mouth daily.    [provider]  losartan (COZAAR) 50 MG tablet Take 1 tablet (50 mg total) by mouth daily. 08/08/23   Sharlene Dory, PA-C  MAGNESIUM GLYCINATE PO Take 1 tablet by mouth at bedtime.    [provider]  melatonin 3 MG TABS tablet Take 3 mg by mouth at bedtime.    [provider]  montelukast (SINGULAIR) 10 MG tablet TAKE 1 TABLET(10 MG) BY MOUTH DAILY Patient taking  differently: Take 10 mg by mouth at bedtime. 04/10/23   Nyoka Cowden, MD  Multiple Vitamins-Minerals (CENTRUM SILVER WOMEN 50+) TABS Take 1 tablet by mouth daily.    [provider]  ondansetron (ZOFRAN-ODT) 8 MG disintegrating tablet Take 8 mg by mouth every 6 (six) hours as needed for vomiting or nausea. 06/23/23   [provider]  pantoprazole (PROTONIX) 40 MG tablet TAKE 30 TO 60 MINUTES BEFORE THE FIRST AND LAST MEAL OF THE DAY Patient taking differently: Take 40 mg by mouth in the morning and at bedtime. 07/24/23   Nyoka Cowden, MD  Potassium 99 MG TABS Take 99 mg by mouth at bedtime.    [provider]   rosuvastatin (CRESTOR) 10 MG tablet Take 1 tablet (10 mg total) by mouth every evening. Patient taking differently: Take 10 mg by mouth at bedtime. 08/08/23   Sharlene Dory, PA-C  senna-docusate (SENOKOT-S) 8.6-50 MG tablet Take 1 tablet by mouth 2 (two) times daily between meals as needed for mild constipation. 09/06/23   Almon Hercules, MD  Specialty Vitamins Products (BIOTIN PLUS KERATIN PO) Take 1 tablet by mouth daily.    [provider]  THEANINE PO Take 1 tablet by mouth at bedtime.    [provider]  TURMERIC CURCUMIN PO Take 1 tablet by mouth daily.    [provider]  valACYclovir (VALTREX) 1000 MG tablet Take 1,000 mg by mouth See admin instructions. Take 1 tablet (1000mg ) twice daily as needed for cold sores - continue until cold sores resolve. 06/09/09   [provider]      Allergies    Delsym [dextromethorphan]    Review of Systems   Review of Systems  Respiratory:  Positive for shortness of breath.   All other systems reviewed and are negative.   Physical Exam Updated Vital Signs BP 109/82   Pulse (!) 112   Resp (!) 40   Ht 5\' 5"  (1.651 m)   Wt 74.9 kg   LMP  (LMP Unknown)   SpO2 92%   BMI 27.48 kg/m  Physical Exam Vitals and nursing note reviewed.  Constitutional:      General: She is not in acute distress.    Appearance: She is well-developed.  HENT:     Head: Normocephalic and atraumatic.  Eyes:     Conjunctiva/sclera: Conjunctivae normal.  Cardiovascular:     Rate and Rhythm: Normal rate and regular rhythm.     Heart sounds: No murmur heard. Pulmonary:     Effort: Tachypnea, accessory muscle usage and respiratory distress present.     Breath sounds: Rales present.     Comments: Patient tachypneic respiratory rates in the 30s on 15 L nonrebreather with accessory muscle usage.   Abdominal:     Palpations: Abdomen is soft.     Tenderness: There is no abdominal tenderness.  Musculoskeletal:        General: No swelling.      Cervical back: Neck supple.     Right lower leg: No edema.     Left lower leg: No edema.  Skin:    General: Skin is warm and dry.     Capillary Refill: Capillary refill takes less than 2 seconds.  Neurological:     Mental Status: She is alert.  Psychiatric:        Mood and Affect: Mood normal.     ED Results / Procedures / Treatments   Labs (all labs ordered are listed, but  only abnormal results are displayed) Labs Reviewed  RESP PANEL BY RT-PCR (RSV, FLU A&B, COVID)  RVPGX2  CULTURE, BLOOD (ROUTINE X 2)  CULTURE, BLOOD (ROUTINE X 2)  COMPREHENSIVE METABOLIC PANEL WITH GFR  CBC WITH DIFFERENTIAL/PLATELET  PROTIME-INR  URINALYSIS, W/ REFLEX TO CULTURE (INFECTION SUSPECTED)  BRAIN NATRIURETIC PEPTIDE  I-STAT CG4 LACTIC ACID, ED  I-STAT VENOUS BLOOD GAS, ED  TROPONIN I (HIGH SENSITIVITY)    EKG None  Radiology No results found.  Procedures .Critical Care  Performed by: Peter Garter, PA Authorized by: Peter Garter, PA   Critical care provider statement:    Critical care time (minutes):  48   Critical care was necessary to treat or prevent imminent or life-threatening deterioration of the following conditions:  Respiratory failure and sepsis   Critical care was time spent personally by me on the following activities:  Development of treatment plan with patient or surrogate, discussions with consultants, evaluation of patient's response to treatment, examination of patient, ordering and review of laboratory studies, ordering and review of radiographic studies, ordering and performing treatments and interventions, pulse oximetry, re-evaluation of patient's condition and review of old charts   I assumed direction of critical care for this patient from another provider in my specialty: no       Medications Ordered in ED Medications  lactated ringers infusion (has no administration in time range)    ED Course/ Medical Decision Making/ A&P Clinical Course as  of 09/11/23 2158  Mon Sep 11, 2023  0624 Reassessment of the patient showed improvement of O2 saturations as well as work of breathing on BiPAP. [CR]  0709 Patient is on BiPap, reports that she is feeling better. O2 94-96% on BiPAP. Troponin is mildly elevated, will need delta. Patient will be admitted for worsening multifocal PNA. [RR]    Clinical Course User Index [CR] Peter Garter, PA [RR] Achille Rich, PA-C                                 Medical Decision Making Amount and/or Complexity of Data Reviewed Labs: ordered. Radiology: ordered.  Risk Prescription drug management. Decision regarding hospitalization.   This patient presents to the ED for concern of shortness of breath, this involves an extensive number of treatment options, and is a complaint that carries with it a high risk of complications and morbidity.  The differential diagnosis includes ACS, PE, pneumothorax, pneumonia, viral URI, anemia, other   Co morbidities that complicate the patient evaluation  See HPI   Additional history obtained:  Additional history obtained from EMR External records from outside source obtained and reviewed including hospital records.  Patient with echocardiogram performed on 08/31/2023 with left ventricular ejection fraction 60 to 65%.   Lab Tests:  I Ordered, and personally interpreted labs.  The pertinent results include: Leukocytosis of 18.2 with left shift.  Anemia with hemoglobin of 10.7 which is near patient's baseline.  Platelets within normal range.  PT/INR within normal limits.  VBG respiratory alkalosis with pH of 7.5, PCO2 of 35.4 normal PO2, bicarb.  Other labs pending.   Imaging Studies ordered:  I ordered imaging studies including chest x-ray I independently visualized and interpreted imaging which showed multifocal pneumonia I agree with the radiologist interpretation   Cardiac Monitoring: / EKG:  The patient was maintained on a cardiac monitor.  I  personally viewed and interpreted the cardiac monitored which showed an underlying rhythm  of: Sinus tachycardia.  Probable left atrial enlargement.  Nonspecific ST elevation.   Consultations Obtained:  N/a   Problem List / ED Course / Critical interventions / Medication management   Acute respiratory failure with hypoxia, multifocal pneumonia, sepsis I ordered medication including 1 L lactated Ringer's, cefepime, vancomycin  Reevaluation of the patient after these medicines showed that the patient improved I have reviewed the patients home medicines and have made adjustments as needed   Social Determinants of Health:  Former cigarette use.  Denies illicit drug use.   Test / Admission - Considered:  Acute respiratory failure with hypoxia, multifocal pneumonia, sepsis Vitals signs significant for hypoxia, tachypnea, tachycardia. Otherwise within normal range and stable throughout visit. Laboratory/imaging studies significant for: See above 73 year old female presents emergency department close of cough, shortness of breath.  Patient recently discharged from the hospital on 09/11/23 on 3 and half to 4 L at home.  States that she has been taking medications as prescribed as well as using at home oxygen.  Over the past 2 to 3 days, shortness of breath has worsened.  Denies any fevers/chills.  Called EMS today and was found to be 60% on her 4 L nasal cannula.  Was placed on 15 L nonrebreather with improvement in O2 sats 94%.  Patient denies any abdominal pain, nausea, vomiting, urinary symptoms, change in bowel habits. On exam, patient with increased breathing respirations in the mid 30s on 15 L nonrebreather.  Rales present bilateral lung fields.  Chest x-ray concerning for worsening multifocal pneumonia.  Patient with significant leukocytosis with left shift if 18.2.  Pending rest of laboratory studies but expected admission for worsening multifocal pneumonia, acute respiratory failure with  hypoxia requiring BiPAP, meeting of SIRS criteria.  At time of shift change, patient care handed off to [facility].  Patient stable upon shift change.         Final Clinical Impression(s) / ED Diagnoses Final diagnoses:  None    Rx / DC Orders ED Discharge Orders     None         Peter Garter, [place] 09/11/23 2202    Shon Baton, MD 09/15/23 [phone]

## 2023-09-11 NOTE — ED Provider Notes (Cosign Needed Addendum)
  Physical Exam  BP 122/71   Pulse 100   Temp 98.1 F (36.7 C) (Oral)   Resp (!) 28   Ht 5\' 5"  (1.651 m)   Wt 74.9 kg   LMP  (LMP Unknown)   SpO2 97%   BMI 27.48 kg/m   Physical Exam Vitals and nursing note reviewed.  Constitutional:      Appearance: She is ill-appearing.  Cardiovascular:     Rate and Rhythm: Tachycardia present.  Pulmonary:     Comments: On BiPAP Skin:    General: Skin is warm and dry.  Neurological:     Mental Status: She is alert.     Procedures  Procedures  ED Course / MDM   Clinical Course as of 09/11/23 0919  Mon Sep 11, 2023  0624 Reassessment of the patient showed improvement of O2 saturations as well as work of breathing on BiPAP. [CR]  0709 Patient is on BiPap, reports that she is feeling better. O2 94-96% on BiPAP. Troponin is mildly elevated, will need delta. Patient will be admitted for worsening multifocal PNA. [RR]    Clinical Course User Index [CR] Peter Garter, PA [RR] Achille Rich, PA-C   Medical Decision Making Amount and/or Complexity of Data Reviewed Labs: ordered. Radiology: ordered.  Risk Prescription drug management. Decision regarding hospitalization.   Accepted handoff at shift change from [facility]. Please see prior provider note for more detail.   Briefly: Patient is 73 y.o. F presenting for evaluation of shortness of breath.  Patient recently discharged from the hospital for multifocal pneumonia and was discharged home on 4 L nasal cannula new.  Patient was found at home hypoxic on the oxygen.  She was further placed on BiPAP while here and is more comfortable.  Plan is to await the rest of the labs to result and admit to medicine.  Fluids and antibiotics for sepsis already initiated.  On re-evaluation, patient mildly tachy at 102, O2 at 96%. The patient reports that she is feeling much better in BiPAP.  CMP shows mildly decreased calcium, total protein, and albumin.  No other electrolyte or LFT  abnormality.  I spoke with patient's daughter, Nolberto Hanlon.  We reviewed patient's labs and plan for admission.  I did speak with Dr. Madelyn Flavors about the daughter wanting to speak with him regarding her concerns. She will be admitted back to hospitalist service.        Achille Rich, [place] 09/11/23 1610  1:52 PM ADDENDUM: Was called for critical report of CT angio.  Does show that she has a PE in the left upper lobe and right middle lobe.  They report that on the CT scan that she received her abdomen in late March it showed consolidation in the lower left lung infiltrate which is improving but now she has diffuse groundglass opacities.  He reports that her lungs in general are not well-appearing. Dr. Madelyn Flavors was alerted to this as he is the admitting physician and has confirmed he received the update.     Achille Rich, PA-C 09/11/23 1352    Eber Hong, MD 09/14/23 [phone]

## 2023-09-11 NOTE — Consult Note (Signed)
 NAME:  Shelly [name], MRN:  [id number], DOB:  06-16-1950, LOS: 0 ADMISSION DATE:  09/11/2023, CONSULTATION DATE:  09/11/23 REFERRING MD:  Dr. Katrinka Blazing, CHIEF COMPLAINT:  SOB   History of Present Illness:   31 yoF with PMH of HTN, HLD, PAD, Right [place] artery stenosis, asthma, RLE avascular necrosis s/p BKA, alzheimers dementia, and former smoker presenting from home with recurrent SOB and hypoxia.  Recently hospitalized 3/26- 4/2 for hypoxic respiratory failure felt related to rhinovirus, RLL strep PNA +/- aspiration PNA (given N/V), possible AECOPD.  Discharged home on 3-4L [place] with 3 days left on steroid taper.    Felt initially better but with progressive SOB, found hypoxic in the 60's on HOT w/ EMS improved on HFNC.  No reported recent fevers, increased cough, productive at times, denies any dysphagia, and complains slightly of left calf pain.  Placed on BiPAP for increased WOB in ER, requiring 60%.  Afebrile, low to normotensive BP.  CXR showing multifocal infiltrates.  SARS/ flu/ RSV neg.  BNP 469, trop hs flat x3 in 20-30's.  PCT 0.13, WBC 18.2.  Empiric vancomycin and cefepime started.  TRH called for admit.  Lactic increased 1.4> 2.  CTA chest showed few scattered segmental PE on left, mild clot burden but changing parenchymal lung opacities of interstitial septal thickening GGO, more left sided with several enlarged mediastinal nodules.  Since in ER awaiting bed, unable to tolerate BiPAP wean due to WOB and hypoxia.  PCCM called for further evaluation.   Pertinent  Medical History  HTN, HLD, PAD, Right [place] artery stenosis, asthma, RLE avascular necrosis s/p BKA, alzheimers dementia, former smoker  Significant Hospital Events: Including procedures, antibiotic start and stop dates in addition to other pertinent events   4/7 Admit recurrent hypoxic respiratory failure, PE, GGO on bipap  Interim History / Subjective:   Objective   Blood pressure 109/65, pulse 93, temperature 98 F (36.7 C),  temperature source Axillary, resp. rate (!) 27, height 5\' 5"  (1.651 m), weight 74.9 kg, SpO2 95%.    Vent Mode: PSV;CPAP FiO2 (%):  [60 %] 60 % PEEP:  [5 cmH20] 5 cmH20 Pressure Support:  [5 cmH20] 5 cmH20  No intake or output data in the 24 hours ending 09/11/23 1621 Filed Weights   09/11/23 0532  Weight: 74.9 kg   Examination: General:  Elderly female sitting upright in ER stretcher on BiPAP in NAD HEENT: full face BiPAP mask, no JVD Neuro: Awake, f/c, MAE, seems appropriate  CV: rr, no murmur PULM:  tachypneic on BiPAP, 10/5 getting consistent TV 450-525ml, 60%, coarse, np cough at times GI: soft, bs+, NT Extremities: warm/dry, R BKA, no LE edema or erythema, mild tenderness Skin: no rashes   Resolved Hospital Problem list    Assessment & Plan:   Recurrent hypoxic respiratory failure with increased bilateral GGO opacities, L> R - recent sed rate 68 on 4/1.  Patulous esophagus on chest CT with calcified node in subcarinal and left hilar c/w old granulomatous disease - recent tx rhinovirus, RLL strep pna (completed 4/2), and steroids (completed 4/5) - ddx include infectious vs inflammatory/ underlying lung process vs edema component.  Possible ongoing aspiration component as well.  Worsening leukocytosis but recently completed steroids, remains afebrile and PCT low.  PE not significant enough to be contributing to WOB/ hypoxia. P:  - will admit to ICU for airway watch, high risk for intubation - NPO w/aspiration precautions.  PPI - will try changing to HHFNC, BiPAP prn -  w/hx of anxiety/ alzheimer's, can consider precedex if needed - O2 sat goal > 90% - empiric broad abx for now> change cefepime to zosyn, azithro, d/c vanc if MRSA PCR neg and follow cultures- blood, sputum if able - solumedrol 120mg  daily for now.  Monitor CBGs on labs, add SSI if > 180 - recheck sed rate, urine legionella and strep - triple nebs - albumin f/b lasix given elevated BNP. Strict I/Os.  Recheck  BNP in am  - CXR in am - SLP when respiratory stable - remains full code pending further GOC with family. Pt deferring to family    Segmental left PE - L sided, mild clot burden, no evidence of heart strain on CT - heparin gtt per pharmacy - LE dupplex to r/o DVT - trop hs 20-30's, no STE on EKG.  Pending repeat echo.  Prior 3/27 w/ EF 60-65, G1DD, normal RV, no significant valvular    HFpEF HTN HLD - lasix as above, hold cozzar for now   Alzheimer's - delirium precautions, supportive care   Macrocytic anemia - H/H stable from prior admit, trend CBC - check B12, folate in am  Best Practice (right click and "Reselect all SmartList Selections" daily)   Diet/type: NPO DVT prophylaxis systemic heparin Pressure ulcer(s): pressure ulcer assessment deferred  GI prophylaxis: PPI Lines: N/A Foley:  N/A Code Status:  full code Last date of multidisciplinary goals of care discussion [4/7]  Family at bedside and daughter who is urgent care PA but out of state updated by Dr. Katrinka Blazing by phone  Labs   CBC: Recent Labs  Lab 09/05/23 0330 09/06/23 0404 09/11/23 0541 09/11/23 0550  WBC 16.0* 17.8* 18.2*  --   NEUTROABS  --   --  16.0*  --   HGB 9.4* 9.9* 10.7* 11.6*  HCT 28.4* 30.1* 34.5* 34.0*  MCV 95.6 96.5 100.3*  --   PLT 289 330 297  --     Basic Metabolic Panel: Recent Labs  Lab 09/05/23 0330 09/06/23 0404 09/11/23 0541 09/11/23 0550  NA 140 139 136 136  K 3.2* 3.7 4.6 4.5  CL 104 105 101  --   CO2 27 22 24   --   GLUCOSE 117* 95 98  --   BUN 15 16 12   --   CREATININE 0.66 0.73 0.77  --   CALCIUM 7.8* 7.9* 8.3*  --   MG 2.2 1.8  --   --   PHOS 2.3*  --   --   --    GFR: Estimated Creatinine Clearance: 62.5 mL/min (by C-G formula based on SCr of 0.77 mg/dL). Recent Labs  Lab 09/05/23 0330 09/06/23 0404 09/11/23 0541 09/11/23 0552 09/11/23 0733 09/11/23 0822  PROCALCITON 0.24 0.13  --   --   --  0.13  WBC 16.0* 17.8* 18.2*  --   --   --    LATICACIDVEN  --   --   --  1.4 2.0*  --     Liver Function Tests: Recent Labs  Lab 09/05/23 0330 09/06/23 0404 09/11/23 0541  AST  --  42* 29  ALT  --  63* 35  ALKPHOS  --  57 71  BILITOT  --  0.7 0.9  PROT  --  5.4* 6.0*  ALBUMIN 2.1* 2.2* 2.4*   No results for input(s): "LIPASE", "AMYLASE" in the last 168 hours. No results for input(s): "AMMONIA" in the last 168 hours.  ABG    Component Value Date/Time   PHART  7.44 09/03/2023 1052   PCO2ART 37 09/03/2023 1052   PO2ART 81 (L) 09/03/2023 1052   HCO3 27.9 09/11/2023 0550   TCO2 29 09/11/2023 0550   O2SAT 85 09/11/2023 0550     Coagulation Profile: Recent Labs  Lab 09/11/23 0541  INR 1.2    Cardiac Enzymes: No results for input(s): "CKTOTAL", "CKMB", "CKMBINDEX", "TROPONINI" in the last 168 hours.  HbA1C: No results found for: "HGBA1C"  CBG: No results for input(s): "GLUCAP" in the last 168 hours.  Review of Systems:   Limited given BiPAP  Past Medical History:  She,  has a past medical history of Anxiety, Arthritis, Asthma, Avascular necrosis of talus (HCC), Cancer (HCC), Depression, Gait abnormality (03/20/2017), GERD (gastroesophageal reflux disease), Hearing loss, History of pneumonia, Hyperlipidemia, Hypertension, Leg edema, Memory difficulty (03/20/2017), PONV (postoperative nausea and vomiting), and Stress incontinence.   Surgical History:   Past Surgical History:  Procedure Laterality Date   ANKLE FUSION  2011   right multiple    ANKLE FUSION     rear ankle fusion   ANKLE SURGERY  2010   right cordicompression   AORTIC ARCH ANGIOGRAPHY N/A 12/11/2017   Procedure: AORTIC ARCH ANGIOGRAPHY;  Surgeon: [name], MD;  Location: [facility];  Service: Cardiovascular;  Laterality: N/A;   APPENDECTOMY  05/10/2016   BELOW KNEE LEG AMPUTATION     CATARACT EXTRACTION W/ INTRAOCULAR LENS IMPLANT Left    LAPAROSCOPIC APPENDECTOMY N/A 05/10/2016   Procedure: APPENDECTOMY LAPAROSCOPIC;   Surgeon: [name], MD;  Location: [facility] OR;  Service: General;  Laterality: N/A;   LUMBAR LAMINECTOMY/DECOMPRESSION MICRODISCECTOMY N/A 09/09/2015   Procedure:  L4-S1 Decompression/ Discetomy;  Surgeon: [name], MD;  Location: [facility] OR;  Service: Orthopedics;  Laterality: N/A;   SKIN GRAFT     SPINAL CORD STIMULATOR BATTERY EXCHANGE  06/23/2023   SPINAL CORD STIMULATOR INSERTION N/A 01/10/2019   Procedure: LUMBAR SPINAL CORD STIMULATOR INSERTION;  Surgeon: [name], MD;  Location: [facility] OR;  Service: Orthopedics;  Laterality: N/A;  2.5 hrs   TONSILLECTOMY     TUBAL LIGATION  1983   VULVECTOMY N/A 01/28/2015   Procedure: WIDE EXCISION VULVECTOMY;  Surgeon: [name], MD;  Location: [facility];  Service: Gynecology;  Laterality: N/A;     Social History:   reports that she quit smoking about 16 years ago. Her smoking use included cigarettes. She started smoking about 56 years ago. She has a 40 pack-year smoking history. She has never used smokeless tobacco. She reports current alcohol use. She reports that she does not use drugs.   Family History:  Her family history includes Allergies in her mother; Breast cancer in her sister; Dementia (age of onset: 7) in her mother; Fibromyalgia in her mother; Heart attack (age of onset: 30) in her father; Heart disease in her brother; Heart murmur (age of onset: 57) in her sister; Multiple myeloma in her father; Other in her brother; Other (age of onset: 26) in her sister.   Allergies Allergies  Allergen Reactions   Delsym [Dextromethorphan] Other (See Comments)    Cognitive impairment     Home Medications  Prior to Admission medications   Medication Sig Start Date End Date Taking? Authorizing Provider  acyclovir cream (ZOVIRAX) 5 % Apply 1 Application topically 2 (two) times daily as needed (cold sore).   Yes [provider]  albuterol (PROAIR HFA) 108 (90 Base) MCG/ACT inhaler 2 puffs every 4 hours as needed only  if your can't  catch  your breath Patient taking differently: Inhale 2 puffs into the lungs every 4 (four) hours as needed for shortness of breath or wheezing. 2 puffs every 4 hours as needed only  if your can't catch your breath 05/03/23  Yes Nyoka Cowden, MD  budesonide-formoterol [facility]) 160-4.5 MCG/ACT inhaler Inhale 2 puffs into the lungs in the morning and at bedtime. 09/06/23  Yes Almon Hercules, MD  buPROPion (WELLBUTRIN XL) 300 MG 24 hr tablet Take 300 mg by mouth daily.   Yes [provider]  calcium carbonate (OSCAL) 1500 (600 Ca) MG TABS tablet Take 600 mg of elemental calcium by mouth at bedtime.   Yes [provider]  denosumab (PROLIA) 60 MG/ML SOLN injection Inject 60 mg into the skin every 6 (six) months.    Yes [provider]  donepezil (ARICEPT) 10 MG tablet Take 1 tablet (10 mg total) by mouth at bedtime. 08/24/23  Yes Glean Salvo, NP  FLUoxetine (PROZAC) 20 MG capsule Take 20 mg by mouth daily.   Yes [provider]  guaiFENesin (MUCINEX) 600 MG 12 hr tablet Take 1 tablet (600 mg total) by mouth 2 (two) times daily for 5 days. 09/06/23 09/11/23 Yes Gonfa, Boyce Medici, MD  ipratropium-albuterol (DUONEB) 0.5-2.5 (3) MG/3ML SOLN Take 3 mLs by nebulization every 6 (six) hours as needed (Shortness of breath, cough and/or wheeze). Use this or albuterol inhaler but not both. 09/06/23  Yes Almon Hercules, MD  losartan (COZAAR) 50 MG tablet Take 1 tablet (50 mg total) by mouth daily. 08/08/23  Yes Conte, Tessa N, PA-C  MAGNESIUM GLYCINATE PO Take 1 tablet by mouth at bedtime.   Yes [provider]  melatonin 3 MG TABS tablet Take 3 mg by mouth at bedtime.   Yes [provider]  montelukast (SINGULAIR) 10 MG tablet TAKE 1 TABLET(10 MG) BY MOUTH DAILY Patient taking differently: Take 10 mg by mouth at bedtime. 04/10/23  Yes Nyoka Cowden, MD  Multiple Vitamins-Minerals (CENTRUM SILVER WOMEN 50+) TABS Take 1 tablet by mouth daily.   Yes [provider]   ondansetron (ZOFRAN-ODT) 8 MG disintegrating tablet Take 8 mg by mouth every 6 (six) hours as needed for vomiting or nausea. 06/23/23  Yes [provider]  pantoprazole (PROTONIX) 40 MG tablet TAKE 30 TO 60 MINUTES BEFORE THE FIRST AND LAST MEAL OF THE DAY Patient taking differently: Take 40 mg by mouth in the morning and at bedtime. 07/24/23  Yes Nyoka Cowden, MD  Potassium 99 MG TABS Take 99 mg by mouth at bedtime.   Yes [provider]  rosuvastatin (CRESTOR) 10 MG tablet Take 1 tablet (10 mg total) by mouth every evening. Patient taking differently: Take 10 mg by mouth at bedtime. 08/08/23  Yes Conte, Tessa N, PA-C  TURMERIC CURCUMIN PO Take 1 tablet by mouth daily.   Yes [provider]  valACYclovir (VALTREX) 1000 MG tablet Take 1,000 mg by mouth See admin instructions. Take 1 tablet (1000mg ) twice daily as needed for cold sores - continue until cold sores resolve. 06/09/09  Yes [provider]  Acetylcarnitine HCl (ACETYL L-CARNITINE PO) Take 1 tablet by mouth daily. Patient not taking: Reported on 09/11/2023    [provider]  APPLE CIDER VINEGAR PO Take 1 tablet by mouth daily. Patient not taking: Reported on 09/11/2023    [provider]  FOLIC ACID-VIT B6-VIT B12 PO Take 1 tablet by mouth daily. Patient not taking: Reported on 09/11/2023  [provider]  Lactobacillus (ACIDOPHILUS PO) Take 1 tablet by mouth daily. Patient not taking: Reported on 09/11/2023    [provider]  senna-docusate (SENOKOT-S) 8.6-50 MG tablet Take 1 tablet by mouth 2 (two) times daily between meals as needed for mild constipation. Patient not taking: Reported on 09/11/2023 09/06/23   Almon Hercules, MD  Specialty Vitamins Products (BIOTIN PLUS KERATIN PO) Take 1 tablet by mouth daily. Patient not taking: Reported on 09/11/2023    [provider]  THEANINE PO Take 1 tablet by mouth at bedtime. Patient not taking: Reported on 09/11/2023     [provider]     Critical care time: 50 mins       Posey Boyer, MSN, AG-ACNP-BC [facility] 09/11/2023, 5:08 PM  See Amion for pager If no response to pager , please call [phone] until 7pm After 7:00 pm call Elink  [phone]

## 2023-09-11 NOTE — H&P (Signed)
 History and Physical    Patient: Shelly [name] ZOX:[id number] DOB: 19-Feb-1951 DOA: 09/11/2023 DOS: the patient was seen and examined on 09/11/2023 PCP: [name], MD  Patient coming from: Home  Chief Complaint:  Chief Complaint  Patient presents with   Shortness of Breath   HPI: Shelly [name] is a 73 y.o. female with medical history significant of hypertension, hyperlipidemia, peripheral artery disease, right subclavian artery stenosis, asthma, right lower extremity avascular necrosis s/p BKA,  Alzheimer's dementia, and prior tobacco abuse who presents with complaints of shortness of breath.  Just recently hospitalized from 3/26-4/2 for acute respiratory failure with hypoxia thought secondary to community acquired pneumonia/possible aspiration pneumonia, positive for rhinovirus, and possible COPD exacerbation.  Patient had treated with empiric antibiotics.  Ultimately patient required discharge home on 3-4 L oxygen.Following  discharge from the hospital, she experienced a brief period of improvement before her condition declined again. She describes her breathing as shallow and is unable to take deep breaths due to pain, particularly when coughing.  She reports having chest pain that is substernal.  No recent fevers. Her cough is productive, especially when using the spirometer, which helps to expel phlegm but also induces coughing.  Denies having any significant leg swelling, calf pain, nausea, vomiting, or diarrhea symptoms.  Patient denies getting choked up with eating.  Upon admission into the emergency department patient was noted to be afebrile, heart rate 79-1 12, respirations 25-40, blood pressure 17, O2 saturation currently maintained after being discharged with BiPAP.  Labs significant for WBC 18.2, hemoglobin 10.7, BNP 469.6, high-sensitivity troponin 25, and lactic acid 1.4->2.  Influenza, COVID-19, and RSV screening were negative.  Chest x-ray showed a progressive bilateral  airspace opacities compatible with multifocal pneumonia.  Blood cultures were obtained.  Patient had been given 1 L of lactated Ringer's, DuoNeb breathing treatment, vancomycin, and cefepime.   Review of Systems: As mentioned in the history of present illness. All other systems reviewed and are negative. Past Medical History:  Diagnosis Date   Anxiety    Arthritis    Asthma    Avascular necrosis of talus (HCC)    Cancer (HCC)    vulva pre cancer had surgery   Depression    Gait abnormality 03/20/2017   GERD (gastroesophageal reflux disease)    Hearing loss    "very minor"   History of pneumonia    Hyperlipidemia    Hypertension    Leg edema    left   Memory difficulty 03/20/2017   PONV (postoperative nausea and vomiting)    Stress incontinence    Past Surgical History:  Procedure Laterality Date   ANKLE FUSION  2011   right multiple    ANKLE FUSION     rear ankle fusion   ANKLE SURGERY  2010   right cordicompression   AORTIC ARCH ANGIOGRAPHY N/A 12/11/2017   Procedure: AORTIC ARCH ANGIOGRAPHY;  Surgeon: [name], MD;  [facility];  Service: Cardiovascular;  Laterality: N/A;   APPENDECTOMY  05/10/2016   BELOW KNEE LEG AMPUTATION     CATARACT EXTRACTION W/ INTRAOCULAR LENS IMPLANT Left    LAPAROSCOPIC APPENDECTOMY N/A 05/10/2016   Procedure: APPENDECTOMY LAPAROSCOPIC;  Surgeon: [name], MD;  Location: [facility];  Service: General;  Laterality: N/A;   LUMBAR LAMINECTOMY/DECOMPRESSION MICRODISCECTOMY N/A 09/09/2015   Procedure:  L4-S1 Decompression/ Discetomy;  Surgeon: [name], MD;  Location: [facility];  Service: Orthopedics;  Laterality: N/A;   SKIN GRAFT  SPINAL CORD STIMULATOR BATTERY EXCHANGE  06/23/2023   SPINAL CORD STIMULATOR INSERTION N/A 01/10/2019   Procedure: LUMBAR SPINAL CORD STIMULATOR INSERTION;  Surgeon: [name], MD;  Location: [facility];  Service: Orthopedics;  Laterality: N/A;  2.5 hrs   TONSILLECTOMY     TUBAL  LIGATION  1983   VULVECTOMY N/A 01/28/2015   Procedure: WIDE EXCISION VULVECTOMY;  Surgeon: [name], MD;  Location: [facility];  Service: Gynecology;  Laterality: N/A;   Social History:  reports that she quit smoking about 16 years ago. Her smoking use included cigarettes. She started smoking about 56 years ago. She has a 40 pack-year smoking history. She has never used smokeless tobacco. She reports current alcohol use. She reports that she does not use drugs.  Allergies  Allergen Reactions   Delsym [Dextromethorphan] Other (See Comments)    Cognitive impairment    Family History  Problem Relation Age of Onset   Dementia Mother 19       alive   Fibromyalgia Mother    Allergies Mother    Heart attack Father 79       deceased   Multiple myeloma Father    Heart murmur Sister 14       she had open heart surgery   Other Sister 94       She is bIpolar- diabetic   Breast cancer Sister    Other Brother        alive   Heart disease Brother        emergent CABG for 99% blocked CAD    Prior to Admission medications   Medication Sig Start Date End Date Taking? Authorizing Provider  albuterol (PROAIR HFA) 108 (90 Base) MCG/ACT inhaler 2 puffs every 4 hours as needed only  if your can't catch your breath Patient taking differently: Inhale 2 puffs into the lungs every 4 (four) hours as needed for shortness of breath or wheezing. 2 puffs every 4 hours as needed only  if your can't catch your breath 05/03/23  Yes Nyoka Cowden, MD  budesonide-formoterol [facility]) 160-4.5 MCG/ACT inhaler Inhale 2 puffs into the lungs in the morning and at bedtime. 09/06/23  Yes Almon Hercules, MD  buPROPion (WELLBUTRIN XL) 300 MG 24 hr tablet Take 300 mg by mouth daily.   Yes [provider]  FLUoxetine (PROZAC) 20 MG capsule Take 20 mg by mouth daily.   Yes [provider]  ipratropium-albuterol (DUONEB) 0.5-2.5 (3) MG/3ML SOLN Take 3 mLs by nebulization every 6 (six) hours as needed  (Shortness of breath, cough and/or wheeze). Use this or albuterol inhaler but not both. 09/06/23  Yes Gonfa, Boyce Medici, MD  montelukast (SINGULAIR) 10 MG tablet TAKE 1 TABLET(10 MG) BY MOUTH DAILY Patient taking differently: Take 10 mg by mouth at bedtime. 04/10/23  Yes Nyoka Cowden, MD  ondansetron (ZOFRAN-ODT) 8 MG disintegrating tablet Take 8 mg by mouth every 6 (six) hours as needed for vomiting or nausea. 06/23/23  Yes [provider]  Acetylcarnitine HCl (ACETYL L-CARNITINE PO) Take 1 tablet by mouth daily.    [provider]  acyclovir cream (ZOVIRAX) 5 % Apply 1 Application topically 2 (two) times daily as needed (cold sore).    [provider]  APPLE CIDER VINEGAR PO Take 1 tablet by mouth daily.    [provider]  calcium carbonate (OSCAL) 1500 (600 Ca) MG TABS tablet Take 600 mg of elemental calcium by mouth at bedtime.    [provider]  denosumab (PROLIA) 60 MG/ML SOLN injection Inject 60 mg into the skin every 6 (six) months.     [provider]  donepezil (ARICEPT) 10 MG tablet Take 1 tablet (10 mg total) by mouth at bedtime. 08/24/23   Glean Salvo, NP  FOLIC ACID-VIT B6-VIT B12 PO Take 1 tablet by mouth daily.    [provider]  guaiFENesin (MUCINEX) 600 MG 12 hr tablet Take 1 tablet (600 mg total) by mouth 2 (two) times daily for 5 days. 09/06/23 09/11/23  Almon Hercules, MD  Lactobacillus (ACIDOPHILUS PO) Take 1 tablet by mouth daily.    [provider]  losartan (COZAAR) 50 MG tablet Take 1 tablet (50 mg total) by mouth daily. 08/08/23   Sharlene Dory, PA-C  MAGNESIUM GLYCINATE PO Take 1 tablet by mouth at bedtime.    [provider]  melatonin 3 MG TABS tablet Take 3 mg by mouth at bedtime.    [provider]  Multiple Vitamins-Minerals (CENTRUM SILVER WOMEN 50+) TABS Take 1 tablet by mouth daily.    [provider]  pantoprazole (PROTONIX) 40 MG tablet TAKE 30 TO 60 MINUTES BEFORE THE  FIRST AND LAST MEAL OF THE DAY Patient taking differently: Take 40 mg by mouth in the morning and at bedtime. 07/24/23   Nyoka Cowden, MD  Potassium 99 MG TABS Take 99 mg by mouth at bedtime.    [provider]  rosuvastatin (CRESTOR) 10 MG tablet Take 1 tablet (10 mg total) by mouth every evening. Patient taking differently: Take 10 mg by mouth at bedtime. 08/08/23   Sharlene Dory, PA-C  senna-docusate (SENOKOT-S) 8.6-50 MG tablet Take 1 tablet by mouth 2 (two) times daily between meals as needed for mild constipation. 09/06/23   Almon Hercules, MD  Specialty Vitamins Products (BIOTIN PLUS KERATIN PO) Take 1 tablet by mouth daily.    [provider]  THEANINE PO Take 1 tablet by mouth at bedtime.    [provider]  TURMERIC CURCUMIN PO Take 1 tablet by mouth daily.    [provider]  valACYclovir (VALTREX) 1000 MG tablet Take 1,000 mg by mouth See admin instructions. Take 1 tablet (1000mg ) twice daily as needed for cold sores - continue until cold sores resolve. 06/09/09   [provider]    Physical Exam: Vitals:   09/11/23 0630 09/11/23 0700 09/11/23 0709 09/11/23 0709  BP: 121/67 122/71    Pulse: 99 (!) 103    Resp: (!) 25 (!) 38    Temp:   98.1 F (36.7 C)   TempSrc:   Oral   SpO2: 93% 94%  95%  Weight:      Height:         Constitutional: Elderly female who appears to be in respiratory distress Eyes: PERRL, lids and conjunctivae normal ENMT: Mucous membranes are moist. Dentures in place. Neck: normal, supple, no significant JVD appreciated. Respiratory: Coarse breath sounds with diffuse rhonchi appreciated in both lung fields.  Patient currently on BiPAP with decreased work of breathing. Cardiovascular: Tachycardic.  No significant lower extremity edema appreciated. Abdomen: no tenderness, no masses palpated.   Bowel sounds positive.  Musculoskeletal: no clubbing / cyanosis. No joint deformity upper and lower extremities. Good ROM, no  contractures. Normal muscle tone.  Skin: no rashes, lesions, ulcers. No induration Neurologic: CN 2-12 grossly intact. Sensation intact, DTR normal. Strength 5/5 in all 4.  Psychiatric: Normal judgment and insight. Alert and oriented x 3.  Normal mood.   Data Reviewed:  EKG reveals sinus tachycardia 112 bpm with left atrial enlargement and ST-T wave changes noted in the anterior lateral region..  Reviewed labs, imaging, and pertinent records as documented.  Assessment and Plan:    SIRS/Sepsis Acute respiratory failure with hypoxia due to multifocal pneumonia Patient presents with increasing shortness of breath.  O2 saturations reported to be as low as 60% on arrival EMS on 4 L of oxygen.  In the ED noted to be tachycardic and tachypneic with high blood cell count elevated to 18.2 meeting SIRS criteria.  She was switched to BiPAP and improvement in work of breathing with O2 saturation currently maintained.  Chest x-ray showing progressive multifocal pneumonia.  Clinically, COVID-19, and RSV screening are negative.  Blood cultures have been obtained and patient was started on empiric antibiotics of vancomycin and cefepime. -Admit to progressive bed -Aspiration precautions with elevation of the leg -Follow-up blood and sputum cultures -Continuous pulse oximetry with oxygen to maintain O2 saturation greater than 92% -Continue BiPAP.  Wean off once able -Incentive spirometry and flutter valve once able -N.p.o. until able to be weaned off BiPAP -Add-on procalcitonin -Check CT angiogram of the chest with and without contrast -Continue vancomycin and cefepime -DuoNebs 4 times daily and albuterol nebs as needed -Brovana and budesonide nebs -Mucinex   Chest pain Elevated troponin Acute.  Patient reports having substernal chest pain which she reports having difficulty taking deep inspiratory breath.  High-sensitivity troponins 25->36.  EKG noted some ST-T wave changes in the anterior lateral  leads.  Patient had echocardiogram during last hospitalization which noted EF to be preserved at 60 to 65% with grade 1 diastolic dysfunction which was unchanged from prior.   -Recheck EKG -Trend cardiac troponin -Follow-up CT angiogram of the chest  Diastolic dysfunction On physical exam patient does not appear grossly fluid overloaded.  BNP was noted to be elevated at 469.6. -Strict intake and output -Daily weights  Alzheimer's dementia without behavioral disturbance During last hospitalization patient did not tolerate sedating medications such as Haldol and trazodone. -Delirium precautions -Continue donepezil    S/p right BKA Patient has a history of avascular necrosis of the right lower extremity leading to BKA.  Anxiety and depression -Continue Prozac and donepezil once able  Hyperlipidemia -Continue Crestor  DVT prophylaxis: Lovenox Advance Care Planning:   Code Status: Full Code    Consults: PCCM  Family Communication: Family present at bedside including daughter over the phone updated  Severity of Illness: The appropriate patient status for this patient is INPATIENT. Inpatient status is judged to be reasonable and necessary in order to provide the required intensity of service to ensure the patient's safety. The patient's presenting symptoms, physical exam findings, and initial radiographic and laboratory data in the context of their chronic comorbidities is felt to place them at high risk for further clinical deterioration. Furthermore, it is not anticipated that the patient will be medically stable for discharge from the hospital within 2 midnights of admission.   * I certify that at the point of admission it is my clinical judgment that the patient will require inpatient hospital care spanning beyond 2 midnights from the point of admission due to high intensity of service, high risk for further deterioration and high frequency of surveillance  required.*  Author: Clydie Braun, MD 09/11/2023 7:27 AM  For on call review [email or web address].

## 2023-09-12 ENCOUNTER — Inpatient Hospital Stay (HOSPITAL_COMMUNITY)

## 2023-09-12 ENCOUNTER — Other Ambulatory Visit (HOSPITAL_COMMUNITY): Payer: Self-pay

## 2023-09-12 ENCOUNTER — Telehealth (HOSPITAL_COMMUNITY): Payer: Self-pay | Admitting: Pharmacy Technician

## 2023-09-12 DIAGNOSIS — Z86711 Personal history of pulmonary embolism: Secondary | ICD-10-CM

## 2023-09-12 DIAGNOSIS — J9601 Acute respiratory failure with hypoxia: Secondary | ICD-10-CM

## 2023-09-12 DIAGNOSIS — I2699 Other pulmonary embolism without acute cor pulmonale: Secondary | ICD-10-CM | POA: Diagnosis not present

## 2023-09-12 LAB — BASIC METABOLIC PANEL WITH GFR
Anion gap: 16 — ABNORMAL HIGH (ref 5–15)
BUN: 16 mg/dL (ref 8–23)
CO2: 27 mmol/L (ref 22–32)
Calcium: 8.5 mg/dL — ABNORMAL LOW (ref 8.9–10.3)
Chloride: 94 mmol/L — ABNORMAL LOW (ref 98–111)
Creatinine, Ser: 0.77 mg/dL (ref 0.44–1.00)
GFR, Estimated: >60 mL/min (ref >60)
Glucose, Bld: 133 mg/dL — ABNORMAL HIGH (ref 70–99)
Potassium: 3.7 mmol/L (ref 3.5–5.1)
Sodium: 137 mmol/L (ref 135–145)

## 2023-09-12 LAB — CBC
HCT: 27.2 % — ABNORMAL LOW (ref 36.0–46.0)
Hemoglobin: 8.9 g/dL — ABNORMAL LOW (ref 12.0–15.0)
MCH: 31.3 pg (ref 26.0–34.0)
MCHC: 32.7 g/dL (ref 30.0–36.0)
MCV: 95.8 fL (ref 80.0–100.0)
Platelets: 281 10*3/uL (ref 150–400)
RBC: 2.84 MIL/uL — ABNORMAL LOW (ref 3.87–5.11)
RDW: 13.7 % (ref 11.5–15.5)
WBC: 11 10*3/uL — ABNORMAL HIGH (ref 4.0–10.5)
nRBC: 0 % (ref 0.0–0.2)

## 2023-09-12 LAB — ECHOCARDIOGRAM LIMITED
Height: 65 in
S' Lateral: 2 cm
Weight: 2373.91 [oz_av]

## 2023-09-12 LAB — LACTIC ACID, PLASMA: Lactic Acid, Venous: 1 mmol/L (ref 0.5–1.9)

## 2023-09-12 LAB — VITAMIN B12: Vitamin B-12: 1285 pg/mL — ABNORMAL HIGH (ref 180–914)

## 2023-09-12 LAB — GLUCOSE, CAPILLARY
Glucose-Capillary: 94 mg/dL (ref 70–99)
Glucose-Capillary: 140 mg/dL — ABNORMAL HIGH (ref 70–99)

## 2023-09-12 LAB — HEPARIN LEVEL (UNFRACTIONATED)
Heparin Unfractionated: 0.47 [IU]/mL (ref 0.30–0.70)
Heparin Unfractionated: 0.29 [IU]/mL — ABNORMAL LOW (ref 0.30–0.70)

## 2023-09-12 LAB — FOLATE: Folate: 34.6 ng/mL (ref >5.9)

## 2023-09-12 MED ORDER — REVEFENACIN 175 MCG/3ML IN SOLN
175.0000 ug | Freq: Every day | RESPIRATORY_TRACT | Status: DC
  Administered 2023-09-12 – 2023-09-30 (×18): 175 ug via RESPIRATORY_TRACT
  Filled 2023-09-12 (×19): qty 3

## 2023-09-12 MED ORDER — POTASSIUM CHLORIDE CRYS ER 20 MEQ PO TBCR
40.0000 meq | EXTENDED_RELEASE_TABLET | Freq: Once | ORAL | Status: AC
  Administered 2023-09-12: 40 meq via ORAL
  Filled 2023-09-12: qty 2

## 2023-09-12 MED ORDER — FUROSEMIDE 10 MG/ML IJ SOLN
40.0000 mg | Freq: Four times a day (QID) | INTRAMUSCULAR | Status: DC
  Administered 2023-09-12: 40 mg via INTRAVENOUS
  Filled 2023-09-12: qty 4

## 2023-09-12 MED ORDER — MELATONIN 5 MG PO TABS
5.0000 mg | ORAL_TABLET | Freq: Once | ORAL | Status: AC
  Administered 2023-09-12: 5 mg via ORAL
  Filled 2023-09-12: qty 1

## 2023-09-12 MED ORDER — METHYLPREDNISOLONE SODIUM SUCC 125 MG IJ SOLR
60.0000 mg | INTRAMUSCULAR | Status: DC
  Administered 2023-09-12: 60 mg via INTRAVENOUS
  Filled 2023-09-12: qty 2

## 2023-09-12 MED ORDER — NOREPINEPHRINE 4 MG/250ML-% IV SOLN: 2.0000 ug/min | INTRAVENOUS | Status: DC

## 2023-09-12 MED ORDER — SODIUM CHLORIDE 0.9 % IV SOLN: 250.0000 mL | INTRAVENOUS | Status: AC

## 2023-09-12 MED ORDER — CHLORHEXIDINE GLUCONATE CLOTH 2 % EX PADS
6.0000 | MEDICATED_PAD | Freq: Every day | CUTANEOUS | Status: DC
  Administered 2023-09-12 – 2023-09-30 (×15): 6 via TOPICAL

## 2023-09-12 MED ORDER — LACTATED RINGERS IV BOLUS
500.0000 mL | Freq: Once | INTRAVENOUS | Status: AC
  Administered 2023-09-12: 500 mL via INTRAVENOUS

## 2023-09-12 NOTE — Telephone Encounter (Signed)
 Patient Product/process development scientist completed.    The patient is insured through [facility]. Patient has Medicare and is not eligible for a copay card, but may be able to apply for patient assistance or Medicare RX Payment Plan (Patient Must reach out to their plan, if eligible for payment plan), if available.    Ran test claim for Eliquis 5 mg and the current 30 day co-pay is $45.00.  Ran test claim for Xarelto 20 mg and the current 30 day co-pay is $45.00.  This test claim was processed through [facility]- copay amounts may vary at other pharmacies due to pharmacy/plan contracts, or as the patient moves through the different stages of their insurance plan.     Roland Earl, CPHT Pharmacy Technician III Certified Patient Advocate [facility] Patient Advocate Team Direct Number: [phone]  Fax: [phone]

## 2023-09-12 NOTE — Evaluation (Signed)
 Clinical/Bedside Swallow Evaluation Patient Details  Name: Shelly [name] MRN: [id number] Date of Birth: 06-25-1950  Today's Date: 09/12/2023 Time: SLP Start Time (ACUTE ONLY): 1010 SLP Stop Time (ACUTE ONLY): 1027 SLP Time Calculation (min) (ACUTE ONLY): 17 min  Past Medical History:  Past Medical History:  Diagnosis Date   Anxiety    Arthritis    Asthma    Avascular necrosis of talus (HCC)    Cancer (HCC)    vulva pre cancer had surgery   Depression    Gait abnormality 03/20/2017   GERD (gastroesophageal reflux disease)    Hearing loss    "very minor"   History of pneumonia    Hyperlipidemia    Hypertension    Leg edema    left   Memory difficulty 03/20/2017   PONV (postoperative nausea and vomiting)    Stress incontinence    Past Surgical History:  Past Surgical History:  Procedure Laterality Date   ANKLE FUSION  2011   right multiple    ANKLE FUSION     rear ankle fusion   ANKLE SURGERY  2010   right cordicompression   AORTIC ARCH ANGIOGRAPHY N/A 12/11/2017   Procedure: AORTIC ARCH ANGIOGRAPHY;  Surgeon: [name], MD;  [facility];  Service: Cardiovascular;  Laterality: N/A;   APPENDECTOMY  05/10/2016   BELOW KNEE LEG AMPUTATION     CATARACT EXTRACTION W/ INTRAOCULAR LENS IMPLANT Left    LAPAROSCOPIC APPENDECTOMY N/A 05/10/2016   Procedure: APPENDECTOMY LAPAROSCOPIC;  Surgeon: [name], MD;  Location: [facility] OR;  Service: General;  Laterality: N/A;   LUMBAR LAMINECTOMY/DECOMPRESSION MICRODISCECTOMY N/A 09/09/2015   Procedure:  L4-S1 Decompression/ Discetomy;  Surgeon: [name], MD;  Location: [facility] OR;  Service: Orthopedics;  Laterality: N/A;   SKIN GRAFT     SPINAL CORD STIMULATOR BATTERY EXCHANGE  06/23/2023   SPINAL CORD STIMULATOR INSERTION N/A 01/10/2019   Procedure: LUMBAR SPINAL CORD STIMULATOR INSERTION;  Surgeon: [name], MD;  Location: [facility] OR;  Service: Orthopedics;  Laterality: N/A;  2.5 hrs   TONSILLECTOMY      TUBAL LIGATION  1983   VULVECTOMY N/A 01/28/2015   Procedure: WIDE EXCISION VULVECTOMY;  Surgeon: [name], MD;  Location: [facility] ORS;  Service: Gynecology;  Laterality: N/A;   HPI:  45 yoF presenting from home with recurrent SOB and hypoxia.  Recently hospitalized 3/26- 4/2 for hypoxic respiratory failure felt related to rhinovirus, RLL strep PNA +/- aspiration PNA (given N/V), possible AECOPD.  Discharged home on 3-4L [place] with 3 days left on steroid taper. Felt initially better but with progressive SOB, found hypoxic in the 60's on HOT w/ EMS improved on HFNC.  No reported recent fevers, increased cough, productive at times, denies any dysphagia, and complains slightly of left calf pain.  Placed on BiPAP for increased WOB in ER, requiring 60%.  Afebrile, low to normotensive BP.  CXR showing multifocal infiltrates.  Since in ER awaiting bed, unable to tolerate BiPAP wean due to WOB and hypoxia. CT angiogram of the chest revealed concerns for scattered left-sided pulmonary emboli with mild clot burden, and changing parenchymal lung opacities with areas of tissue and septal thickening left lung worse than the right. Pt evalauted by SLP in initial admission, no finding of dysphagia.    Assessment / Plan / Recommendation  Clinical Impression  Pt demonstrates no signs of aspiration, she is eager to eat. She is on HHFNC at 20Lpm but has a natural awareness of needed careful timing  of breathing and swallowing. She takes some deep breaths before given herself a small sip. She denies any recent trouble swallowing, but does recall likely aspirating her vomit prior to initial admission. Will initaite a dys 3/thin diet for now and f/u with MBS when pt is off HHFNC to examine swallow function prior to d/c given pna. Mechanical soft recommended as pt doesnt have her bottom denture present SLP Visit Diagnosis: Dysphagia, unspecified (R13.10)    Aspiration Risk  Mild aspiration risk    Diet Recommendation  Dysphagia 3 (Mech soft);Thin liquid    Liquid Administration via: Cup;Straw Medication Administration: Whole meds with liquid Supervision: Patient able to self feed Compensations: Slow rate;Small sips/bites Postural Changes: Seated upright at 90 degrees    Other  Recommendations      Recommendations for follow up therapy are one component of a multi-disciplinary discharge planning process, led by the attending physician.  Recommendations may be updated based on patient status, additional functional criteria and insurance authorization.  Follow up Recommendations        Assistance Recommended at Discharge    Functional Status Assessment    Frequency and Duration            Prognosis Prognosis for improved oropharyngeal function: Good      Swallow Study   General HPI: 71 yoF presenting from home with recurrent SOB and hypoxia.  Recently hospitalized 3/26- 4/2 for hypoxic respiratory failure felt related to rhinovirus, RLL strep PNA +/- aspiration PNA (given N/V), possible AECOPD.  Discharged home on 3-4L [place] with 3 days left on steroid taper. Felt initially better but with progressive SOB, found hypoxic in the 60's on HOT w/ EMS improved on HFNC.  No reported recent fevers, increased cough, productive at times, denies any dysphagia, and complains slightly of left calf pain.  Placed on BiPAP for increased WOB in ER, requiring 60%.  Afebrile, low to normotensive BP.  CXR showing multifocal infiltrates.  Since in ER awaiting bed, unable to tolerate BiPAP wean due to WOB and hypoxia. CT angiogram of the chest revealed concerns for scattered left-sided pulmonary emboli with mild clot burden, and changing parenchymal lung opacities with areas of tissue and septal thickening left lung worse than the right. Pt evalauted by SLP in initial admission, no finding of dysphagia. Type of Study: Bedside Swallow Evaluation Previous Swallow Assessment: see impression Diet Prior to this Study:  NPO Temperature Spikes Noted: No Respiratory Status: Other (comment) (HHFNC 20Lpm) History of Recent Intubation: No Behavior/Cognition: Alert;Cooperative;Pleasant mood Oral Cavity Assessment: Within Functional Limits Oral Care Completed by SLP: No Oral Cavity - Dentition: Adequate natural dentition Vision: Functional for self-feeding Self-Feeding Abilities: Able to feed self Patient Positioning: Upright in bed Baseline Vocal Quality: Normal Volitional Cough: Strong Volitional Swallow: Able to elicit    Oral/Motor/Sensory Function Overall Oral Motor/Sensory Function: Within functional limits   Ice Chips     Thin Liquid Thin Liquid: Within functional limits    Nectar Thick Nectar Thick Liquid: Not tested   Honey Thick Honey Thick Liquid: Not tested   Puree Puree: Within functional limits   Solid     Solid: Within functional limits      Lanee Chain, Riley Nearing 09/12/2023,10:43 AM

## 2023-09-12 NOTE — TOC CM/SW Note (Addendum)
 Transition of Care [facility]) - Inpatient Brief Assessment   Patient Details  Name: Shelly [name] MRN: [id number] Date of Birth: 03-03-1951  [facility] CM/SW Contact:    Tom-Johnson, Hershal Coria, RN Phone Number: 09/12/2023, 1:32 PM   Clinical Narrative:  Patient presented to the ED with Shortness Of Breath. Admitted with Acute Respiratory Failure with Hypoxia and worsening Multifocal Pneumonia. CT Angiogram of the Chest showed concerns for scattered Lt-sided Pulmonary Emboli, started on Heparin gtt and IV abx. On 35L HFNC.  Patient recently admitted with same dx, Rhino Virus and possible COPD exacerbation  from 08/30/23 to 09/06/23. Has hx of PAD, Rt Subclavian Artery Stenosis, Rt BKA with Prosthesis, Dementia, HLD, HTN.    From home alone, has three supportive children who lives out of state. Has two supportive brothers nearby. Independent with care and drives self. Has all necessary DME's at home. Patient was discharged home from last admit with home O2 and Nebulizer machine from [place] and home health RN from [place].   PCP is [name], MD and uses [facility] on [address].   Patient requested CM contact her Prosthesis company about part replacement for her Prosthesis. CM called [facility] and spoke with [facility] [phone]). [place] Ma states she will send someone to check with patient in the hospital and assess needs.   Awaiting PT/OT eval for disposition.  Patient not Medically ready for discharge.  CM will continue to follow as patient progresses with care towards discharge.       Transition of Care Asessment: Insurance and Status: Insurance coverage has been reviewed Patient has primary care physician: Yes Home environment has been reviewed: Yes Prior level of function:: Modified Independent Prior/Current Home Services: Current home services Recruitment consultant) Social Drivers of Health Review: SDOH reviewed no interventions necessary Readmission  risk has been reviewed: Yes Transition of care needs: transition of care needs identified, TOC will continue to follow

## 2023-09-12 NOTE — Progress Notes (Signed)
 NAME:  Shelly [name], MRN:  [id number], DOB:  1951/02/20, LOS: 1 ADMISSION DATE:  09/11/2023, CONSULTATION DATE:  09/11/23 REFERRING MD:  Dr. Katrinka Blazing, CHIEF COMPLAINT:  SOB   History of Present Illness:   57 yoF with PMH of HTN, HLD, PAD, Right [place] artery stenosis, asthma, RLE avascular necrosis s/p BKA, alzheimers dementia, and former smoker presenting from home with recurrent SOB and hypoxia.  Recently hospitalized 3/26- 4/2 for hypoxic respiratory failure felt related to rhinovirus, RLL strep PNA +/- aspiration PNA (given N/V), possible AECOPD.  Discharged home on 3-4L [place] with 3 days left on steroid taper.    Felt initially better but with progressive SOB, found hypoxic in the 60's on HOT w/ EMS improved on HFNC.  No reported recent fevers, increased cough, productive at times, denies any dysphagia, and complains slightly of left calf pain.  Placed on BiPAP for increased WOB in ER, requiring 60%.  Afebrile, low to normotensive BP.  CXR showing multifocal infiltrates.  SARS/ flu/ RSV neg.  BNP 469, trop hs flat x3 in 20-30's.  PCT 0.13, WBC 18.2.  Empiric vancomycin and cefepime started.  TRH called for admit.  Lactic increased 1.4> 2.  CTA chest showed few scattered segmental PE on left, mild clot burden but changing parenchymal lung opacities of interstitial septal thickening GGO, more left sided with several enlarged mediastinal nodules.  Since in ER awaiting bed, unable to tolerate BiPAP wean due to WOB and hypoxia.  PCCM called for further evaluation.   Pertinent  Medical History  HTN, HLD, PAD, Right [place] artery stenosis, asthma, RLE avascular necrosis s/p BKA, alzheimers dementia, former smoker  Significant Hospital Events: Including procedures, antibiotic start and stop dates in addition to other pertinent events   4/7 Admit recurrent hypoxic respiratory failure, PE, GGO on bipap  Interim History / Subjective:   Remains on high flow nasal cannula. Wants something to eat  Objective    Blood pressure (!) 90/55, pulse 93, temperature 98.1 F (36.7 C), temperature source Oral, resp. rate (!) 27, height 5\' 5"  (1.651 m), weight 67.3 kg, SpO2 90%.    Vent Mode: PSV;CPAP FiO2 (%):  [60 %-65 %] 65 % PEEP:  [5 cmH20] 5 cmH20 Pressure Support:  [5 cmH20] 5 cmH20   Intake/Output Summary (Last 24 hours) at 09/12/2023 0956 Last data filed at 09/12/2023 0900 Gross per 24 hour  Intake 956.46 ml  Output 3400 ml  Net -2443.54 ml   Filed Weights   09/11/23 0532 09/11/23 2336 09/12/23 0424  Weight: 74.9 kg 67.3 kg 67.3 kg   Examination: Gen:      No acute distress HEENT:  EOMI, sclera anicteric Neck:     No masses; no thyromegaly Lungs:    Clear to auscultation bilaterally; normal respiratory effort CV:         Regular rate and rhythm; no murmurs Abd:      + bowel sounds; soft, non-tender; no palpable masses, no distension Ext:    Rt BKA Skin:      Warm and dry; no rash Neuro: alert and oriented x 3 Psych: normal mood and affect   Lab/imaging reviewed Metabolic panel is stable Significant for WBC 11.0, hemoglobin 8.9 Chest x-ray with stable bilateral airspace disease   Resolved Hospital Problem list    Assessment & Plan:  Recurrent hypoxic respiratory failure with increased bilateral GGO opacities, L> R - recent sed rate 68 on 4/1.  Patulous esophagus on chest CT with calcified node in subcarinal and  left hilar c/w old granulomatous disease - recent tx rhinovirus, RLL strep pna (completed 4/2), and steroids (completed 4/5) - ddx include infectious vs inflammatory/ underlying lung process vs edema component.  Possible ongoing aspiration component as well.  Worsening leukocytosis but recently completed steroids, remains afebrile and PCT low.  PE not significant enough to be contributing to WOB/ hypoxia. P:  Continue high flow nasal cannula Wean as tolerated Continue Zosyn.  Discontinue Vanco as MRSA PCR is negative Reduce Solu-Medrol to 60 mg/day Continue Brovana,  Pulmicort.  Add Yupelri nebs Follow cultures, urine Legionella, strep Follow intermittent chest x-ray  Concern for aspiration Keep NPO.  SLP evaluation ordered  Segmental left PE - L sided, mild clot burden, no evidence of heart strain on CT Continue heparin drip Lower extremity duplex, echocardiogram are pending  HFpEF HTN HLD Lasix for diuresis  Alzheimer's Delirium precautions, supportive care  Macrocytic anemia H/H stable from prior admit, trend CBC Check B12, folate in am  Best Practice (right click and "Reselect all SmartList Selections" daily)   Diet/type: NPO DVT prophylaxis systemic heparin Pressure ulcer(s): pressure ulcer assessment deferred  GI prophylaxis: PPI Lines: N/A Foley:  N/A Code Status:  full code Last date of multidisciplinary goals of care discussion [4/7]  Family at bedside and daughter who is urgent care PA but out of state updated by Dr. Katrinka Blazing by phone  Critical care time:    The patient is critically ill with multiple organ system failure and requires high complexity decision making for assessment and support, frequent evaluation and titration of therapies, advanced monitoring, review of radiographic studies and interpretation of complex data.   Critical Care Time devoted to patient care services, exclusive of separately billable procedures, described in this note is 35 minutes.   Chilton Greathouse MD [facility] See Amion for pager  If no response to pager , please call [phone] until 7pm After 7:00 pm call Elink  [phone] 09/12/2023, 10:09 AM

## 2023-09-12 NOTE — Progress Notes (Signed)
 PHARMACY - ANTICOAGULATION CONSULT NOTE  Pharmacy Consult for heparin Indication: pulmonary embolus  Allergies  Allergen Reactions   Delsym [Dextromethorphan] Other (See Comments)    Cognitive impairment    Patient Measurements: Height: 5\' 5"  (165.1 cm) Weight: 67.3 kg (148 lb 5.9 oz) IBW/kg (Calculated) : 57 HEPARIN DW (KG): 72.3  Vital Signs: Temp: 98.1 F (36.7 C) (04/08 1532) Temp Source: Oral (04/08 1532) BP: 104/52 (04/08 1630) Pulse Rate: 98 (04/08 1630)  Labs: Recent Labs    09/11/23 0541 09/11/23 0550 09/11/23 0719 09/11/23 1028 09/11/23 2334 09/12/23 0511 09/12/23 0815 09/12/23 1613  HGB 10.7* 11.6*  --   --   --  8.9*  --   --   HCT 34.5* 34.0*  --   --   --  27.2*  --   --   PLT 297  --   --   --   --  281  --   --   LABPROT 15.1  --   --   --   --   --   --   --   INR 1.2  --   --   --   --   --   --   --   HEPARINUNFRC  --   --   --   --  0.72*  --  0.47 0.29*  CREATININE 0.77  --   --   --   --  0.77  --   --   TROPONINIHS 25*  --  36* 26*  --   --   --   --     Estimated Creatinine Clearance: 55.5 mL/min (by C-G formula based on SCr of 0.77 mg/dL).   Medical History: Past Medical History:  Diagnosis Date   Anxiety    Arthritis    Asthma    Avascular necrosis of talus (HCC)    Cancer (HCC)    vulva pre cancer had surgery   Depression    Gait abnormality 03/20/2017   GERD (gastroesophageal reflux disease)    Hearing loss    "very minor"   History of pneumonia    Hyperlipidemia    Hypertension    Leg edema    left   Memory difficulty 03/20/2017   PONV (postoperative nausea and vomiting)    Stress incontinence     Assessment: 34 YOF presenting with SOB. CT angio chest with scattered segmental PE, mild clot burden. Not on AC PTA. Pharmacy has been consulted to dose heparin.  Doppler consistent with age indeterminate DVT.  Confirmatory heparin level is slightly sub-therapeutic at 0.29 units/mL.  No issue with heparin infusion per  RN.  No bleeding reported.  Goal of Therapy:  Heparin level 0.3-0.7 units/ml Monitor platelets by anticoagulation protocol: Yes   Plan:  Increase heparin gtt to 1300 units/hr Daily heparin level and CBC Monitor s/sx of bleeding F/u long term AC plan  Reece Fehnel D. Laney Potash, PharmD, BCPS, BCCCP 09/12/2023, 5:30 PM

## 2023-09-12 NOTE — Progress Notes (Signed)
 Bilateral lower extremity venous duplex has been completed   Results can be found in chart review under CV Proc.  09/12/2023 3:32 PM  Fernande Bras, RVT.

## 2023-09-12 NOTE — Progress Notes (Signed)
 PHARMACY - ANTICOAGULATION  Pharmacy Consult for heparin Indication: pulmonary embolus Brief A/P: Heparin level slightly supratherapeutic   Continue Heparin at current rate for now  Allergies  Allergen Reactions   Delsym [Dextromethorphan] Other (See Comments)    Cognitive impairment    Patient Measurements: Height: 5\' 5"  (165.1 cm) Weight: 67.3 kg (148 lb 5.9 oz) IBW/kg (Calculated) : 57 HEPARIN DW (KG): 72.3  Vital Signs: Temp: 97.9 F (36.6 C) (04/07 2349) Temp Source: Oral (04/07 2349) BP: 89/53 (04/07 2330) Pulse Rate: 83 (04/08 0000)  Labs: Recent Labs    09/11/23 0541 09/11/23 0550 09/11/23 0719 09/11/23 1028 09/11/23 2334  HGB 10.7* 11.6*  --   --   --   HCT 34.5* 34.0*  --   --   --   PLT 297  --   --   --   --   LABPROT 15.1  --   --   --   --   INR 1.2  --   --   --   --   HEPARINUNFRC  --   --   --   --  0.72*  CREATININE 0.77  --   --   --   --   TROPONINIHS 25*  --  36* 26*  --     Estimated Creatinine Clearance: 55.5 mL/min (by C-G formula based on SCr of 0.77 mg/dL).  Assessment: 73 y.o. female with PE for heparin  Goal of Therapy:  Heparin level 0.3-0.7 units/ml Monitor platelets by anticoagulation protocol: Yes   Plan:  No change to heparin for now Check heparin level in 8 hours and adjust rate at that time if necessary  Geannie Risen, PharmD, BCPS  09/12/2023 12:20 AM

## 2023-09-12 NOTE — Plan of Care (Signed)

## 2023-09-12 NOTE — Progress Notes (Signed)
 PHARMACY - ANTICOAGULATION CONSULT NOTE  Pharmacy Consult for heparin Indication: pulmonary embolus  Allergies  Allergen Reactions   Delsym [Dextromethorphan] Other (See Comments)    Cognitive impairment    Patient Measurements: Height: 5\' 5"  (165.1 cm) Weight: 67.3 kg (148 lb 5.9 oz) IBW/kg (Calculated) : 57 HEPARIN DW (KG): 72.3  Vital Signs: Temp: 98.1 F (36.7 C) (04/08 0726) Temp Source: Oral (04/08 0726) BP: 91/42 (04/08 0900) Pulse Rate: 95 (04/08 0900)  Labs: Recent Labs    09/11/23 0541 09/11/23 0550 09/11/23 0719 09/11/23 1028 09/11/23 2334 09/12/23 0511 09/12/23 0815  HGB 10.7* 11.6*  --   --   --  8.9*  --   HCT 34.5* 34.0*  --   --   --  27.2*  --   PLT 297  --   --   --   --  281  --   LABPROT 15.1  --   --   --   --   --   --   INR 1.2  --   --   --   --   --   --   HEPARINUNFRC  --   --   --   --  0.72*  --  0.47  CREATININE 0.77  --   --   --   --  0.77  --   TROPONINIHS 25*  --  36* 26*  --   --   --     Estimated Creatinine Clearance: 55.5 mL/min (by C-G formula based on SCr of 0.77 mg/dL).   Medical History: Past Medical History:  Diagnosis Date   Anxiety    Arthritis    Asthma    Avascular necrosis of talus (HCC)    Cancer (HCC)    vulva pre cancer had surgery   Depression    Gait abnormality 03/20/2017   GERD (gastroesophageal reflux disease)    Hearing loss    "very minor"   History of pneumonia    Hyperlipidemia    Hypertension    Leg edema    left   Memory difficulty 03/20/2017   PONV (postoperative nausea and vomiting)    Stress incontinence     Assessment: 9 YOF presenting with SOB. CT angio chest with scattered segmental PE, mild clot burden. Not on AC PTA. Pharmacy has been consulted to dose heparin.  Heparin level is therapeutic (0.47) on 1200 units/hr. No issues with infusion or s/sx of bleeding per RN. CBC Hgbs relatively stable around 9-10.  Goal of Therapy:  Heparin level 0.3-0.7 units/ml Monitor  platelets by anticoagulation protocol: Yes   Plan:  Continue heparin gtt at 1200 units/hr F/u 8hr cHL Daily heparin level and CBC Monitor s/sx of bleeding F/u long term [facility] plan  Nicole Kindred, PharmD PGY1 Pharmacy Resident 09/12/2023 9:29 AM

## 2023-09-13 ENCOUNTER — Encounter (HOSPITAL_COMMUNITY)

## 2023-09-13 ENCOUNTER — Encounter: Admitting: Physical Therapy

## 2023-09-13 DIAGNOSIS — J9601 Acute respiratory failure with hypoxia: Secondary | ICD-10-CM | POA: Diagnosis not present

## 2023-09-13 DIAGNOSIS — I2699 Other pulmonary embolism without acute cor pulmonale: Secondary | ICD-10-CM | POA: Diagnosis not present

## 2023-09-13 LAB — LEGIONELLA PNEUMOPHILA SEROGP 1 UR AG: L. pneumophila Serogp 1 Ur Ag: NEGATIVE

## 2023-09-13 LAB — CBC
HCT: 26.1 % — ABNORMAL LOW (ref 36.0–46.0)
Hemoglobin: 8.5 g/dL — ABNORMAL LOW (ref 12.0–15.0)
MCH: 31.1 pg (ref 26.0–34.0)
MCHC: 32.6 g/dL (ref 30.0–36.0)
MCV: 95.6 fL (ref 80.0–100.0)
Platelets: 321 10*3/uL (ref 150–400)
RBC: 2.73 MIL/uL — ABNORMAL LOW (ref 3.87–5.11)
RDW: 13.7 % (ref 11.5–15.5)
WBC: 11.5 10*3/uL — ABNORMAL HIGH (ref 4.0–10.5)
nRBC: 0 % (ref 0.0–0.2)

## 2023-09-13 LAB — BASIC METABOLIC PANEL WITH GFR
Anion gap: 9 (ref 5–15)
BUN: 17 mg/dL (ref 8–23)
CO2: 30 mmol/L (ref 22–32)
Calcium: 8.4 mg/dL — ABNORMAL LOW (ref 8.9–10.3)
Chloride: 98 mmol/L (ref 98–111)
Creatinine, Ser: 0.58 mg/dL (ref 0.44–1.00)
GFR, Estimated: >60 mL/min (ref >60)
Glucose, Bld: 165 mg/dL — ABNORMAL HIGH (ref 70–99)
Potassium: 3.8 mmol/L (ref 3.5–5.1)
Sodium: 137 mmol/L (ref 135–145)

## 2023-09-13 LAB — EXPECTORATED SPUTUM ASSESSMENT W GRAM STAIN, RFLX TO RESP C: Special Requests: NORMAL

## 2023-09-13 LAB — HEPARIN LEVEL (UNFRACTIONATED): Heparin Unfractionated: 0.5 [IU]/mL (ref 0.30–0.70)

## 2023-09-13 MED ORDER — PREDNISONE 20 MG PO TABS: 20.0000 mg | ORAL_TABLET | Freq: Every day | ORAL | Status: DC

## 2023-09-13 MED ORDER — IPRATROPIUM-ALBUTEROL 0.5-2.5 (3) MG/3ML IN SOLN
3.0000 mL | RESPIRATORY_TRACT | Status: DC | PRN
  Administered 2023-09-25: 3 mL via RESPIRATORY_TRACT
  Filled 2023-09-13: qty 3

## 2023-09-13 MED ORDER — MELATONIN 3 MG PO TABS
3.0000 mg | ORAL_TABLET | Freq: Every day | ORAL | Status: DC
  Administered 2023-09-14 – 2023-09-29 (×17): 3 mg via ORAL
  Filled 2023-09-13 (×17): qty 1

## 2023-09-13 MED ORDER — BENZOCAINE 10 % MT GEL
Freq: Three times a day (TID) | OROMUCOSAL | Status: DC | PRN
  Filled 2023-09-13: qty 9

## 2023-09-13 MED ORDER — BUPROPION HCL ER (XL) 150 MG PO TB24
300.0000 mg | ORAL_TABLET | Freq: Every day | ORAL | Status: DC
  Administered 2023-09-13 – 2023-09-30 (×18): 300 mg via ORAL
  Filled 2023-09-13: qty 1
  Filled 2023-09-13 (×16): qty 2

## 2023-09-13 MED ORDER — APIXABAN 5 MG PO TABS
5.0000 mg | ORAL_TABLET | Freq: Two times a day (BID) | ORAL | Status: DC
  Administered 2023-09-20 – 2023-09-30 (×21): 5 mg via ORAL
  Filled 2023-09-13 (×21): qty 1

## 2023-09-13 MED ORDER — POTASSIUM CHLORIDE CRYS ER 20 MEQ PO TBCR
40.0000 meq | EXTENDED_RELEASE_TABLET | Freq: Once | ORAL | Status: AC
  Administered 2023-09-13: 40 meq via ORAL
  Filled 2023-09-13: qty 2

## 2023-09-13 MED ORDER — PREDNISONE 20 MG PO TABS: 40.0000 mg | ORAL_TABLET | Freq: Every day | ORAL | Status: DC

## 2023-09-13 MED ORDER — APIXABAN 5 MG PO TABS
10.0000 mg | ORAL_TABLET | Freq: Two times a day (BID) | ORAL | Status: AC
Start: 2023-09-13 — End: 2023-09-19
  Administered 2023-09-13 – 2023-09-19 (×14): 10 mg via ORAL
  Filled 2023-09-13 (×14): qty 2

## 2023-09-13 MED ORDER — MONTELUKAST SODIUM 10 MG PO TABS
10.0000 mg | ORAL_TABLET | Freq: Every day | ORAL | Status: DC
  Administered 2023-09-14 – 2023-09-29 (×17): 10 mg via ORAL
  Filled 2023-09-13 (×17): qty 1

## 2023-09-13 MED ORDER — PANTOPRAZOLE SODIUM 40 MG PO TBEC
40.0000 mg | DELAYED_RELEASE_TABLET | Freq: Every day | ORAL | Status: DC
  Administered 2023-09-13 – 2023-09-30 (×18): 40 mg via ORAL
  Filled 2023-09-13 (×19): qty 1

## 2023-09-13 MED ORDER — PREDNISONE 5 MG PO TABS: 10.0000 mg | ORAL_TABLET | Freq: Every day | ORAL | Status: DC

## 2023-09-13 MED ORDER — PREDNISONE 5 MG PO TABS: 50.0000 mg | ORAL_TABLET | Freq: Every day | ORAL | Status: DC

## 2023-09-13 MED ORDER — PREDNISONE 20 MG PO TABS
60.0000 mg | ORAL_TABLET | Freq: Every day | ORAL | Status: AC
  Administered 2023-09-13 – 2023-09-15 (×3): 60 mg via ORAL
  Filled 2023-09-13 (×3): qty 3

## 2023-09-13 NOTE — Plan of Care (Signed)
  Problem: Coping: Goal: Level of anxiety will decrease Outcome: Not Progressing   Problem: Education: Goal: Knowledge of General Education information will improve Description: Including pain rating scale, medication(s)/side effects and non-pharmacologic comfort measures Outcome: Progressing   Problem: Health Behavior/Discharge Planning: Goal: Ability to manage health-related needs will improve Outcome: Progressing   Problem: Clinical Measurements: Goal: Ability to maintain clinical measurements within normal limits will improve Outcome: Progressing Goal: Will remain free from infection Outcome: Progressing Goal: Diagnostic test results will improve Outcome: Progressing Goal: Respiratory complications will improve Outcome: Progressing Goal: Cardiovascular complication will be avoided Outcome: Progressing   Problem: Activity: Goal: Risk for activity intolerance will decrease Outcome: Progressing   Problem: Nutrition: Goal: Adequate nutrition will be maintained Outcome: Progressing   Problem: Elimination: Goal: Will not experience complications related to bowel motility Outcome: Progressing Goal: Will not experience complications related to urinary retention Outcome: Progressing   Problem: Pain Managment: Goal: General experience of comfort will improve and/or be controlled Outcome: Progressing   Problem: Safety: Goal: Ability to remain free from injury will improve Outcome: Progressing   Problem: Skin Integrity: Goal: Risk for impaired skin integrity will decrease Outcome: Progressing

## 2023-09-13 NOTE — Progress Notes (Signed)
 NAME:  Shelly [name], MRN:  [id number], DOB:  1950-09-22, LOS: 2 ADMISSION DATE:  09/11/2023, CONSULTATION DATE:  09/11/23 REFERRING MD:  Dr. Katrinka Blazing, CHIEF COMPLAINT:  SOB   History of Present Illness:   49 yoF with PMH of HTN, HLD, PAD, Right [place] artery stenosis, asthma, RLE avascular necrosis s/p BKA, alzheimers dementia, and former smoker presenting from home with recurrent SOB and hypoxia.  Recently hospitalized 3/26- 4/2 for hypoxic respiratory failure felt related to rhinovirus, RLL strep PNA +/- aspiration PNA (given N/V), possible AECOPD.  Discharged home on 3-4L [place] with 3 days left on steroid taper.    Felt initially better but with progressive SOB, found hypoxic in the 60's on HOT w/ EMS improved on HFNC.  No reported recent fevers, increased cough, productive at times, denies any dysphagia, and complains slightly of left calf pain.  Placed on BiPAP for increased WOB in ER, requiring 60%.  Afebrile, low to normotensive BP.  CXR showing multifocal infiltrates.  SARS/ flu/ RSV neg.  BNP 469, trop hs flat x3 in 20-30's.  PCT 0.13, WBC 18.2.  Empiric vancomycin and cefepime started.  TRH called for admit.  Lactic increased 1.4> 2.  CTA chest showed few scattered segmental PE on left, mild clot burden but changing parenchymal lung opacities of interstitial septal thickening GGO, more left sided with several enlarged mediastinal nodules.  Since in ER awaiting bed, unable to tolerate BiPAP wean due to WOB and hypoxia.  PCCM called for further evaluation.   Pertinent  Medical History  HTN, HLD, PAD, Right [place] artery stenosis, asthma, RLE avascular necrosis s/p BKA, alzheimers dementia, former smoker  Significant Hospital Events: Including procedures, antibiotic start and stop dates in addition to other pertinent events   4/7 Admit recurrent hypoxic respiratory failure, PE, GGO on bipap  Interim History / Subjective:   On 60% and 35 l/m HHFNC w/ sats 90%  Objective   Blood pressure (!) 117/59,  pulse 76, temperature 98.1 F (36.7 C), temperature source Oral, resp. rate (!) 29, height 5\' 5"  (1.651 m), weight 67.3 kg, SpO2 93%.    FiO2 (%):  [59 %-72 %] 59 %   Intake/Output Summary (Last 24 hours) at 09/13/2023 0730 Last data filed at 09/13/2023 0600 Gross per 24 hour  Intake 1591.14 ml  Output 900 ml  Net 691.14 ml   Filed Weights   09/11/23 2336 09/12/23 0424 09/13/23 0500  Weight: 67.3 kg 67.3 kg 67.3 kg   Examination: General:   elderly female in NAD HEENT: MM pink/moist; HHFNC in place Neuro: Aox3; MAE CV: s1s2, RRR, no m/r/g PULM:  dim clear BS bilaterally; HHFNC 60% 35 l/m GI: soft, bsx4 active  Extremities: warm/dry, LLE w/ pain on palpation  Resolved Hospital Problem list    Assessment & Plan:  Recurrent hypoxic respiratory failure with increased bilateral GGO opacities, L> R - recent sed rate 68 on 4/1.  Patulous esophagus on chest CT with calcified node in subcarinal and left hilar c/w old granulomatous disease - recent tx rhinovirus, RLL strep pna (completed 4/2), and steroids (completed 4/5) - ddx include infectious vs inflammatory/ underlying lung process vs edema component.  Possible ongoing aspiration component as well.  Worsening leukocytosis but recently completed steroids, remains afebrile and PCT low.  PE not significant enough to be contributing to WOB/ hypoxia. P:  -cont HHFNC for sats >92% -cont zosyn and azithro; follow cultures -pulm toiletry: Flutter/is -PT/OT/OOB -cont triple therapy nebs; prn duoneb -cont mucinex -strep pna negative;  legionella pending -slow steroid taper  Concern for aspiration P: -SLP eval w/ no signs of aspiration; dysphagia 3/thin liquid diet  Segmental left PE LLE dvt - L sided, mild clot burden, no evidence of heart strain on CT P: -switch to eliquis  HFpEF HTN HLD P: -daily weights -resume statin  Alzheimer's P: -Delirium precautions -resume aricept, wellbutrin, and prozac  Macrocytic  anemia P: -trend cbc  Best Practice (right click and "Reselect all SmartList Selections" daily)   Diet/type: dysphagia diet (see orders) DVT prophylaxis systemic heparin; switch to doac Pressure ulcer(s): pressure ulcer assessment deferred  GI prophylaxis: PPI Lines: N/A Foley:  N/A Code Status:  full code Last date of multidisciplinary goals of care discussion [4/7]  Critical care time:    [facility] 09/13/2023, 9:28 AM  Please see [email or web address] for pager details.  From 7A-7P if no response, please call [phone]. After hours, please call ELink [phone].

## 2023-09-13 NOTE — Progress Notes (Signed)
 Speech Language Pathology Treatment: Dysphagia  Patient Details Name: Pattye Meda MRN: [id number] DOB: [birth year] Today's Date: 09/13/2023 Time: 1050-1107 SLP Time Calculation (min) (ACUTE ONLY): 17 min  Assessment / Plan / Recommendation Clinical Impression  Pt seen up in chair on 35Lpm on HHFNC. Daughter also at bedside. Pt reports tolerating PO since yesterday. Asked RN and pt to hold PO if O2 flow over 40 LPM. Discussed plan for instrumental testing given some concern that pt may have worsened previously after initiating PO. SLP observed pt attempt oral rinse resulting in immediate hard cough, suggesting good sensation for airway protection. Silent/occult aspiration doubtful but will proceed with FEES tomorrow at 10 am for instrumental assessment for more confidence with safety. If pt were to come off HHFNC then MBS would be a better test for this pt. Will check in tomorrow.   HPI HPI: 41 yoF presenting from home with recurrent SOB and hypoxia.  Recently hospitalized 3/26- 4/2 for hypoxic respiratory failure felt related to rhinovirus, RLL strep PNA +/- aspiration PNA (given N/V), possible AECOPD.  Discharged home on 3-4L [place] with 3 days left on steroid taper. Felt initially better but with progressive SOB, found hypoxic in the 60's on HOT w/ EMS improved on HFNC.  No reported recent fevers, increased cough, productive at times, denies any dysphagia, and complains slightly of left calf pain.  Placed on BiPAP for increased WOB in ER, requiring 60%.  Afebrile, low to normotensive BP.  CXR showing multifocal infiltrates.  Since in ER awaiting bed, unable to tolerate BiPAP wean due to WOB and hypoxia. CT angiogram of the chest revealed concerns for scattered left-sided pulmonary emboli with mild clot burden, and changing parenchymal lung opacities with areas of tissue and septal thickening left lung worse than the right. Pt evalauted by SLP in initial admission, no finding of dysphagia.      SLP  Plan  Continue with current plan of care      Recommendations for follow up therapy are one component of a multi-disciplinary discharge planning process, led by the attending physician.  Recommendations may be updated based on patient status, additional functional criteria and insurance authorization.    Recommendations  Diet recommendations: Thin liquid;Dysphagia 3 (mechanical soft) Liquids provided via: Cup;Straw Medication Administration: Whole meds with liquid Supervision: Patient able to self feed Compensations: Slow rate;Small sips/bites Postural Changes and/or Swallow Maneuvers: Seated upright 90 degrees                  Oral care BID           Continue with current plan of care     Temitayo Covalt, Riley Nearing  09/13/2023, 11:30 AM

## 2023-09-13 NOTE — TOC Progression Note (Signed)
 Transition of Care [facility]) - Progression Note    Patient Details  Name: Shelly [name] MRN: [id number] Date of Birth: Aug 30, 1950  [facility] CM/SW Contact  Shelly [name], Shelly Coria, RN Phone Number: 09/13/2023, 12:48 PM  Clinical Narrative:     CM spoke with patient at bedside about home health recommendations. Patient currently active with RN disciplines with [facility]. New recommendations added to resumption of care order, Shelly [name] noted acceptance and info on AVS.  Patient notified CM that a tech from [facility] named Shelly [name], came to her room to assess her prosthesis, states Shelly [name] will return tomorrow to pickup prosthesis and make necessary repairs and order parts needed.  Patient continues on 35L/60% HHFNC, IV abx, Neb tx and transitioned from Heparin gtt to Eliquis.   Patient not Medically ready for discharge.  CM will continue to follow as patient progresses with care towards discharge         Expected Discharge Plan and Services                                   HH Arranged: PT, OT, RN, Disease Management, Nurse's Aide   Date [facility] Agency Contacted: 09/13/23 Time HH Agency Contacted: 1000 Representative spoke with at [facility] Agency: Shelly [name]   Social Determinants of Health (SDOH) Interventions SDOH Screenings   Food Insecurity: No Food Insecurity (09/11/2023)  Housing: Low Risk  (09/11/2023)  Transportation Needs: No Transportation Needs (09/11/2023)  Utilities: Not At Risk (09/11/2023)  Social Connections: Moderately Isolated (09/11/2023)  Tobacco Use: Medium Risk (09/11/2023)    Readmission Risk Interventions    09/12/2023    1:24 PM 09/05/2023    4:03 PM  Readmission Risk Prevention Plan  Transportation Screening Complete Complete  PCP or Specialist Appt within 5-7 Days  Complete  PCP or Specialist Appt within 3-5 Days Complete   Home Care Screening  Complete  Medication Review (RN CM)  Complete  HRI or Home Care Consult Complete   Social Work Consult for  Recovery Care Planning/Counseling Complete   Palliative Care Screening Not Applicable   Medication Review Oceanographer) Referral to Pharmacy

## 2023-09-13 NOTE — Progress Notes (Signed)
 PHARMACY - ANTICOAGULATION CONSULT NOTE  Pharmacy Consult for heparin > Eliquis Indication: pulmonary embolus  Allergies  Allergen Reactions   Delsym [Dextromethorphan] Other (See Comments)    Cognitive impairment    Patient Measurements: Height: 5\' 5"  (165.1 cm) Weight: 67.3 kg (148 lb 5.9 oz) IBW/kg (Calculated) : 57 HEPARIN DW (KG): 72.3  Vital Signs: Temp: 98.2 F (36.8 C) (04/09 0745) Temp Source: Oral (04/09 0745) BP: 113/83 (04/09 0700) Pulse Rate: 68 (04/09 0745)  Labs: Recent Labs    09/11/23 0541 09/11/23 0550 09/11/23 0719 09/11/23 1028 09/11/23 2334 09/12/23 0511 09/12/23 0815 09/12/23 1613 09/13/23 0437  HGB 10.7* 11.6*  --   --   --  8.9*  --   --  8.5*  HCT 34.5* 34.0*  --   --   --  27.2*  --   --  26.1*  PLT 297  --   --   --   --  281  --   --  321  LABPROT 15.1  --   --   --   --   --   --   --   --   INR 1.2  --   --   --   --   --   --   --   --   HEPARINUNFRC  --   --   --   --    < >  --  0.47 0.29* 0.50  CREATININE 0.77  --   --   --   --  0.77  --   --  0.58  TROPONINIHS 25*  --  36* 26*  --   --   --   --   --    < > = values in this interval not displayed.    Estimated Creatinine Clearance: 55.5 mL/min (by C-G formula based on SCr of 0.58 mg/dL).   Medical History: Past Medical History:  Diagnosis Date   Anxiety    Arthritis    Asthma    Avascular necrosis of talus (HCC)    Cancer (HCC)    vulva pre cancer had surgery   Depression    Gait abnormality 03/20/2017   GERD (gastroesophageal reflux disease)    Hearing loss    "very minor"   History of pneumonia    Hyperlipidemia    Hypertension    Leg edema    left   Memory difficulty 03/20/2017   PONV (postoperative nausea and vomiting)    Stress incontinence     Assessment: 53 YOF presenting with SOB. CT angio chest with scattered segmental PE, mild clot burden. Not on AC PTA. Pharmacy has been consulted to dose heparin.  Doppler consistent with age indeterminate  DVT.  Approaching 48hrs of heparin therapy. Okay to switch to Eliquis per CCM. Hgb slightly down to 8.5, but no s/sx of bleeding reported.  Goal of Therapy:  Heparin level 0.3-0.7 units/ml Monitor platelets by anticoagulation protocol: Yes   Plan:  Discontinue heparin gtt at time of first Eliquis dose Start Eliquis 10mg  PO BID x 7 days, then 5mg  PO BID Pharmacy will continue to monitor peripherally  Nicole Kindred, PharmD PGY1 Pharmacy Resident 09/13/2023 8:30 AM

## 2023-09-13 NOTE — Progress Notes (Signed)
 PHARMACY - ANTICOAGULATION CONSULT NOTE  Pharmacy Consult for heparin Indication: pulmonary embolus  Allergies  Allergen Reactions   Delsym [Dextromethorphan] Other (See Comments)    Cognitive impairment    Patient Measurements: Height: 5\' 5"  (165.1 cm) Weight: 67.3 kg (148 lb 5.9 oz) IBW/kg (Calculated) : 57 HEPARIN DW (KG): 72.3  Vital Signs: Temp: 98.1 F (36.7 C) (04/09 0315) Temp Source: Oral (04/09 0315) BP: 90/51 (04/09 0400) Pulse Rate: 62 (04/09 0400)  Labs: Recent Labs    09/11/23 0541 09/11/23 0550 09/11/23 0719 09/11/23 1028 09/11/23 2334 09/12/23 0511 09/12/23 0815 09/12/23 1613 09/13/23 0437  HGB 10.7* 11.6*  --   --   --  8.9*  --   --  8.5*  HCT 34.5* 34.0*  --   --   --  27.2*  --   --  26.1*  PLT 297  --   --   --   --  281  --   --  321  LABPROT 15.1  --   --   --   --   --   --   --   --   INR 1.2  --   --   --   --   --   --   --   --   HEPARINUNFRC  --   --   --   --    < >  --  0.47 0.29* 0.50  CREATININE 0.77  --   --   --   --  0.77  --   --  0.58  TROPONINIHS 25*  --  36* 26*  --   --   --   --   --    < > = values in this interval not displayed.    Estimated Creatinine Clearance: 55.5 mL/min (by C-G formula based on SCr of 0.58 mg/dL).   Medical History: Past Medical History:  Diagnosis Date   Anxiety    Arthritis    Asthma    Avascular necrosis of talus (HCC)    Cancer (HCC)    vulva pre cancer had surgery   Depression    Gait abnormality 03/20/2017   GERD (gastroesophageal reflux disease)    Hearing loss    "very minor"   History of pneumonia    Hyperlipidemia    Hypertension    Leg edema    left   Memory difficulty 03/20/2017   PONV (postoperative nausea and vomiting)    Stress incontinence     Assessment: 80 YOF presenting with SOB. CT angio chest with scattered segmental PE, mild clot burden. Not on AC PTA. Pharmacy has been consulted to dose heparin.  Doppler consistent with age indeterminate  DVT.  Confirmatory heparin level is slightly sub-therapeutic at 0.29 units/mL.  No issue with heparin infusion per RN.  No bleeding reported.  4/9 AM update:  Heparin level therapeutic   Goal of Therapy:  Heparin level 0.3-0.7 units/ml Monitor platelets by anticoagulation protocol: Yes   Plan:  Cont heparin 1300 units/hr Heparin level in 8 hours  Abran Duke, PharmD, BCPS Clinical Pharmacist Phone: [phone]

## 2023-09-13 NOTE — Plan of Care (Signed)

## 2023-09-13 NOTE — Progress Notes (Signed)
 Updated Daughter Nolberto Hanlon over phone.  JD Anselm Lis [facility] 09/13/2023, 1:51 PM  Please see [email or web address] for pager details.  From 7A-7P if no response, please call [phone]. After hours, please call ELink [phone].

## 2023-09-13 NOTE — Discharge Instructions (Addendum)
 Information on my medicine - ELIQUIS  (apixaban )  This medication education was reviewed with me or my healthcare representative as part of my discharge preparation.  Why was Eliquis  prescribed for you? Eliquis  was prescribed to treat blood clots that may have been found in the veins of your legs (deep vein thrombosis) or in your lungs (pulmonary embolism) and to reduce the risk of them occurring again.  What do You need to know about Eliquis  ? Your dose is ONE 5 mg tablet taken TWICE daily.  Eliquis  may be taken with or without food.   Try to take the dose about the same time in the morning and in the evening. If you have difficulty swallowing the tablet whole please discuss with your pharmacist how to take the medication safely.  Take Eliquis  exactly as prescribed and DO NOT stop taking Eliquis  without talking to the doctor who prescribed the medication.  Stopping may increase your risk of developing a new blood clot.  Refill your prescription before you run out.  After discharge, you should have regular check-up appointments with your healthcare provider that is prescribing your Eliquis .    What do you do if you miss a dose? If a dose of ELIQUIS  is not taken at the scheduled time, take it as soon as possible on the same day and twice-daily administration should be resumed. The dose should not be doubled to make up for a missed dose.  Important Safety Information A possible side effect of Eliquis  is bleeding. You should call your healthcare provider right away if you experience any of the following: Bleeding from an injury or your nose that does not stop. Unusual colored urine (red or dark brown) or unusual colored stools (red or black). Unusual bruising for unknown reasons. A serious fall or if you hit your head (even if there is no bleeding).  Some medicines may interact with Eliquis  and might increase your risk of bleeding or clotting while on Eliquis . To help avoid this,  consult your healthcare provider or pharmacist prior to using any new prescription or non-prescription medications, including herbals, vitamins, non-steroidal anti-inflammatory drugs (NSAIDs) and supplements.  This website has more information on Eliquis  (apixaban ): [email or web address] Shelly [name]      Follow with Primary MD Faustina Hood, MD in 7 days   Get CBC, CMP, Magnesium , 2 view Chest X ray -  checked next visit with your primary MD or SNF MD  in 2-3 days  Activity: As tolerated with Full fall precautions use walker/cane & assistance as needed  Disposition SNF  Diet: Dysphagia 3 diet with 1.5 L fluid restriction per day, with feeding assistance and aspiration precautions.  Special Instructions: If you have smoked or chewed Tobacco  in the last 2 yrs please stop smoking, stop any regular Alcohol   and or any Recreational drug use.  On your next visit with your primary care physician please Get Medicines reviewed and adjusted.  Please request your Prim.MD to go over all Hospital Tests and Procedure/Radiological results at the follow up, please get all Hospital records sent to your Prim MD by signing hospital release before you go home.  If you experience worsening of your admission symptoms, develop shortness of breath, life threatening emergency, suicidal or homicidal thoughts you must seek medical attention immediately by calling 911 or calling your MD immediately  if symptoms less severe.  You Must read complete instructions/literature along with all the possible adverse reactions/side effects for all the Medicines you take and that have been prescribed to  you. Take any new Medicines after you have completely understood and accpet all the possible adverse reactions/side effects.   Do not drive when taking Pain medications.  Do not take more than prescribed Pain, Sleep and Anxiety Medications  Wear Seat belts while driving.

## 2023-09-13 NOTE — Evaluation (Signed)
 Physical Therapy Evaluation Patient Details Name: Shelly [name] MRN: [id number] DOB: 12-Jan-1951 Today's Date: 09/13/2023  History of Present Illness  73 y.o. female admitted 09/11/23 with hypoxemia and PE. PMhx: Admission 3/26-4/2 with N/V, falls, CAP. Rt BKA, HTN, HLD, mild early Alzheimer's dementia, asthma, fibromyalgia, anxiety.  Clinical Impression  Pt admitted with above diagnosis and presents to PT with functional limitations due to deficits listed below (See PT problem list). Pt needs skilled PT to maximize independence and safety. Pt lives alone and had returned to independence without the use of walker after her recent hospitalization. She plans to return to her apartment and plans to move to [facility] independent living at some point in the near future. She moved fairly well today but limited to bed to bsc to chair due to pt on HHFNC and with coughing fit resulting in decr SpO2. Expect she will make good progress as respiratory status improves.           If plan is discharge home, recommend the following: Assist for transportation;Help with stairs or ramp for entrance;Assistance with cooking/housework   Can travel by private vehicle        Equipment Recommendations None recommended by PT  Recommendations for Other Services       Functional Status Assessment Patient has had a recent decline in their functional status and demonstrates the ability to make significant improvements in function in a reasonable and predictable amount of time.     Precautions / Restrictions Precautions Precautions: Fall Recall of Precautions/Restrictions: Intact Precaution/Restrictions Comments: watch SpO2 Restrictions Weight Bearing Restrictions Per Provider Order: No      Mobility  Bed Mobility Overal bed mobility: Needs Assistance Bed Mobility: Supine to Sit     Supine to sit: Supervision, HOB elevated     General bed mobility comments: supervision for safety and lines     Transfers Overall transfer level: Needs assistance Equipment used: Rolling walker (2 wheels), None Transfers: Sit to/from Stand, Bed to chair/wheelchair/BSC Sit to Stand: Contact guard assist   Step pivot transfers: Contact guard assist       General transfer comment: Assist for safety and lines. Bed to [facility] pivoted with prosthetic but no assistive device. BSC to recliner using walker    Ambulation/Gait               General Gait Details: Did not attempt due to pt on HHFNC and SpO2 dropping to 70's with coughing on getting OOB  Stairs            Wheelchair Mobility     Tilt Bed    Modified Rankin (Stroke Patients Only)       Balance Overall balance assessment: Needs assistance Sitting-balance support: No upper extremity supported, Feet supported Sitting balance-Leahy Scale: Good     Standing balance support: Single extremity supported, Bilateral upper extremity supported Standing balance-Leahy Scale: Poor Standing balance comment: UE support                             Pertinent Vitals/Pain Pain Assessment Pain Assessment: No/denies pain    Home Living Family/patient expects to be discharged to:: Private residence Living Arrangements: Alone Available Help at Discharge: Family;Available PRN/intermittently Type of Home: House Home Access: Stairs to enter Entrance Stairs-Rails: None Entrance Stairs-Number of Steps: 2 Alternate Level Stairs-Number of Steps: chair lift Home Layout: Two level;Bed/bath upstairs Home Equipment: Rolling Walker (2 wheels);Wheelchair - manual;Toilet riser;Cane - single point;Hand held  shower head;Grab bars - tub/shower;Grab bars - toilet;Tub bench;Other (comment);Lift chair (knee scooter)      Prior Function Prior Level of Function : Independent/Modified Independent             Mobility Comments: Used prosthetic but had advanced past using walker since last admission       Extremity/Trunk Assessment    Upper Extremity Assessment Upper Extremity Assessment: Defer to OT evaluation    Lower Extremity Assessment Lower Extremity Assessment: Generalized weakness;RLE deficits/detail RLE Deficits / Details: BKA with prosthetic       Communication   Communication Communication: No apparent difficulties    Cognition Arousal: Alert Behavior During Therapy: WFL for tasks assessed/performed   PT - Cognitive impairments: History of cognitive impairments                       PT - Cognition Comments: mild early Alzheimer's dementia Following commands: Intact       Cueing Cueing Techniques: Verbal cues     General Comments      Exercises     Assessment/Plan    PT Assessment Patient needs continued PT services  PT Problem List Decreased strength;Decreased activity tolerance;Decreased balance;Decreased mobility       PT Treatment Interventions DME instruction;Gait training;Functional mobility training;Therapeutic activities;Therapeutic exercise;Balance training;Patient/family education;Stair training    PT Goals (Current goals can be found in the Care Plan section)  Acute Rehab PT Goals Patient Stated Goal: return home with plans to transition to [facility] soon PT Goal Formulation: With patient/family Time For Goal Achievement: 09/27/23 Potential to Achieve Goals: Good    Frequency Min 2X/week     Co-evaluation               AM-PAC PT "6 Clicks" Mobility  Outcome Measure Help needed turning from your back to your side while in a flat bed without using bedrails?: None Help needed moving from lying on your back to sitting on the side of a flat bed without using bedrails?: A Little Help needed moving to and from a bed to a chair (including a wheelchair)?: A Little Help needed standing up from a chair using your arms (e.g., wheelchair or bedside chair)?: A Little Help needed to walk in hospital room?: Total Help needed climbing 3-5  steps with a railing? : Total 6 Click Score: 15    End of Session Equipment Utilized During Treatment: Oxygen Activity Tolerance: Treatment limited secondary to medical complications (Comment) (Limited due to coughing and decr SpO2) Patient left: in chair;with call bell/phone within reach;with chair alarm set;with family/visitor present;with nursing/sitter in room Nurse Communication: Mobility status PT Visit Diagnosis: Other abnormalities of gait and mobility (R26.89);Muscle weakness (generalized) (M62.81)    Time: 9147-8295 PT Time Calculation (min) (ACUTE ONLY): 37 min   Charges:   PT Evaluation $PT Eval Moderate Complexity: 1 Mod PT Treatments $Therapeutic Activity: 8-22 mins PT General Charges $$ ACUTE PT VISIT: 1 Visit         [facility] PT Acute Rehabilitation Services Office [phone]   Angelina Ok [facility] 09/13/2023, 10:00 AM

## 2023-09-14 ENCOUNTER — Inpatient Hospital Stay (HOSPITAL_COMMUNITY)

## 2023-09-14 DIAGNOSIS — I5189 Other ill-defined heart diseases: Secondary | ICD-10-CM | POA: Diagnosis not present

## 2023-09-14 DIAGNOSIS — I2699 Other pulmonary embolism without acute cor pulmonale: Secondary | ICD-10-CM | POA: Diagnosis not present

## 2023-09-14 DIAGNOSIS — J189 Pneumonia, unspecified organism: Secondary | ICD-10-CM | POA: Diagnosis not present

## 2023-09-14 DIAGNOSIS — J9601 Acute respiratory failure with hypoxia: Secondary | ICD-10-CM | POA: Diagnosis not present

## 2023-09-14 DIAGNOSIS — I2609 Other pulmonary embolism with acute cor pulmonale: Secondary | ICD-10-CM | POA: Diagnosis not present

## 2023-09-14 LAB — CBC
HCT: 26.9 % — ABNORMAL LOW (ref 36.0–46.0)
Hemoglobin: 8.6 g/dL — ABNORMAL LOW (ref 12.0–15.0)
MCH: 31.2 pg (ref 26.0–34.0)
MCHC: 32 g/dL (ref 30.0–36.0)
MCV: 97.5 fL (ref 80.0–100.0)
Platelets: 338 10*3/uL (ref 150–400)
RBC: 2.76 MIL/uL — ABNORMAL LOW (ref 3.87–5.11)
RDW: 13.8 % (ref 11.5–15.5)
WBC: 11.9 10*3/uL — ABNORMAL HIGH (ref 4.0–10.5)
nRBC: 0 % (ref 0.0–0.2)

## 2023-09-14 LAB — BASIC METABOLIC PANEL WITH GFR
Anion gap: 7 (ref 5–15)
BUN: 20 mg/dL (ref 8–23)
CO2: 32 mmol/L (ref 22–32)
Calcium: 8.7 mg/dL — ABNORMAL LOW (ref 8.9–10.3)
Chloride: 100 mmol/L (ref 98–111)
Creatinine, Ser: 0.62 mg/dL (ref 0.44–1.00)
GFR, Estimated: >60 mL/min (ref >60)
Glucose, Bld: 110 mg/dL — ABNORMAL HIGH (ref 70–99)
Potassium: 3.6 mmol/L (ref 3.5–5.1)
Sodium: 139 mmol/L (ref 135–145)

## 2023-09-14 MED ORDER — SODIUM CHLORIDE 0.9 % IV SOLN: INTRAVENOUS | Status: AC | PRN

## 2023-09-14 NOTE — Progress Notes (Signed)
 Physical Therapy Treatment Patient Details Name: Shelly [name] MRN: [id number] DOB: July 28, 1950 Today's Date: 09/14/2023   History of Present Illness 73 y.o. female admitted 09/11/23 with hypoxemia and PE. PMhx: Admission 3/26-4/2 with N/V, falls, CAP. Rt BKA, HTN, HLD, mild early Alzheimer's dementia, asthma, fibromyalgia, anxiety.    PT Comments  Progressing towards established acute functional goals. Supervision for transfer and gait with RW for support. Able to independently donne RLE prosthesis. Educated on gait techniques to maximize safety with RW. SpO2 88-90% with variable waveform throughout ambulatory bout (on 15L - tanks only offer 10 and 15L in the needed range; she desats on 10L at rest so 15L was utilized.) She is feeling confident about returning home. With RW, pt did not experience any loss of balance today. States she has been ambulating at home with O2 tubing and this has been cumbersome but denies tripping or falling PTA. Patient will continue to benefit from skilled physical therapy services to further improve independence with functional mobility.     If plan is discharge home, recommend the following: Assist for transportation;Help with stairs or ramp for entrance;Assistance with cooking/housework   Can travel by private vehicle        Equipment Recommendations  None recommended by PT    Recommendations for Other Services       Precautions / Restrictions Precautions Precautions: Fall Recall of Precautions/Restrictions: Intact Precaution/Restrictions Comments: watch SpO2 Restrictions Weight Bearing Restrictions Per Provider Order: No     Mobility  Bed Mobility               General bed mobility comments: in recliner    Transfers Overall transfer level: Needs assistance Equipment used: Rolling walker (2 wheels), None Transfers: Sit to/from Stand Sit to Stand: Supervision           General transfer comment: Supervision for safety after  independently donning her RLE prosthesis. Slow rise but no LOB with RW for support.    Ambulation/Gait Ambulation/Gait assistance: Supervision Gait Distance (Feet): 105 Feet Assistive device: Rolling walker (2 wheels) Gait Pattern/deviations: Step-through pattern, Decreased stride length, Trunk flexed Gait velocity: dec Gait velocity interpretation: <1.8 ft/sec, indicate of risk for recurrent falls   General Gait Details: Rt prosthesis may not have been charged (C-leg) causing occasional early forefoot strike on Rt but able to self correct and stabilize her self adequately with RW for support. Good awareness. (Recharging at end of session.) Educated on upright posture, proximity to AD to maximize safety and support. SpO2 88-90% with variable waveform throughout ambulatory bout (on 15L - tanks only offer 10 and 15L in the needed range; she desats on 10L at rest so 15L was utilized.)   Comptroller Bed    Modified Rankin (Stroke Patients Only)       Balance Overall balance assessment: Needs assistance Sitting-balance support: No upper extremity supported, Feet supported Sitting balance-Leahy Scale: Good     Standing balance support: No upper extremity supported, During functional activity Standing balance-Leahy Scale: Fair Standing balance comment: Able to stand adjust RLE prosthesis with BIL UEs, at a CGA level for safety.                            Communication Communication Communication: No apparent difficulties  Cognition Arousal: Alert Behavior During Therapy: WFL for tasks assessed/performed   PT - Cognitive  impairments: History of cognitive impairments                         Following commands: Intact      Cueing Cueing Techniques: Verbal cues  Exercises Other Exercises Other Exercises: IS x 5 reps cues for technique and frequency Other Exercises: Educated on repositioning techniques, pressure  redistribution for sacrum due to discomfort reported.    General Comments General comments (skin integrity, edema, etc.): At rest SpO2 95% on 12L, 88% on 10L HFNC. HR 83; RR 25; BP 112/51. Ambulating SpO2 read 88-90% on 15L.      Pertinent Vitals/Pain Pain Assessment Pain Assessment: Faces Faces Pain Scale: Hurts little more Pain Location: buttocks Pain Descriptors / Indicators: Sore Pain Intervention(s): Monitored during session, Repositioned, Other (comment) (pillows, upright position in chair with sacrum placed against back rest of chair.)    Home Living Family/patient expects to be discharged to:: Private residence Living Arrangements: Alone Available Help at Discharge: Family;Available PRN/intermittently Type of Home: House Home Access: Stairs to enter Entrance Stairs-Rails: None Entrance Stairs-Number of Steps: 1+1 Alternate Level Stairs-Number of Steps: chair lift Home Layout: Two level;Bed/bath upstairs Home Equipment: Rolling Walker (2 wheels);Wheelchair - manual;Toilet riser;Cane - single point;Hand held shower head;Grab bars - tub/shower;Grab bars - toilet;Tub bench;Other (comment);Lift chair (knee scooter) Additional Comments: Pt lives alone, has stair lift.    Prior Function            PT Goals (current goals can now be found in the care plan section) Acute Rehab PT Goals Patient Stated Goal: return home with plans to transition to [facility] soon PT Goal Formulation: With patient/family Time For Goal Achievement: 09/27/23 Potential to Achieve Goals: Good Progress towards PT goals: Progressing toward goals    Frequency    Min 2X/week      PT Plan      Co-evaluation              AM-PAC PT "6 Clicks" Mobility   Outcome Measure  Help needed turning from your back to your side while in a flat bed without using bedrails?: None Help needed moving from lying on your back to sitting on the side of a flat bed without using  bedrails?: A Little Help needed moving to and from a bed to a chair (including a wheelchair)?: A Little Help needed standing up from a chair using your arms (e.g., wheelchair or bedside chair)?: A Little Help needed to walk in hospital room?: A Little Help needed climbing 3-5 steps with a railing? : Total 6 Click Score: 17    End of Session Equipment Utilized During Treatment: Oxygen Activity Tolerance: Patient tolerated treatment well Patient left: in chair;with call bell/phone within reach;with chair alarm set   PT Visit Diagnosis: Other abnormalities of gait and mobility (R26.89);Muscle weakness (generalized) (M62.81)     Time: 4098-1191 PT Time Calculation (min) (ACUTE ONLY): 46 min  Charges:    $Gait Training: 8-22 mins $Therapeutic Activity: 23-37 mins PT General Charges $$ ACUTE PT VISIT: 1 Visit                     Kathlyn Sacramento, PT, DPT [facility]  Rehabilitation Services Physical Therapist Office: [phone] Website: [email or web address]    Berton Mount 09/14/2023, 11:45 AM

## 2023-09-14 NOTE — Evaluation (Signed)
 Occupational Therapy Evaluation Patient Details Name: Shelly [name] MRN: [id number] DOB: Nov 20, 1950 Today's Date: 09/14/2023   History of Present Illness   73 y.o. female admitted 09/11/23 with hypoxemia and PE. PMhx: Admission 3/26-4/2 with N/V, falls, CAP. Rt BKA, HTN, HLD, mild early Alzheimer's dementia, asthma, fibromyalgia, anxiety.     Clinical Impressions Pt c/o fatigue, SOB with activity, on 15L O2 via [place], resting at 95% O2. Pt lives alone, has stair lift and 1+1 STE, PLOF mod I using R prosthetic, did not need AD for ambulation. Pt at this time displays good overall independence for ADLs limited mainly by SOB and desaturation to low 80's with activity. Pt set up/supervision for ADLs, completed 10X STS at supervision level using RW on 15L O2, desats to 80's and requires 2-3 minutes to recover to 95% O2. Will continue to see Pt acutely to progress as able, HHOT recommended at this time to address poor activity tolerance and SOB, if improves quickly likely will not need follow up. Pt has all DME needed to remain safe/independent.      If plan is discharge home, recommend the following:   A little help with walking and/or transfers;A little help with bathing/dressing/bathroom;Assistance with cooking/housework;Assist for transportation;Help with stairs or ramp for entrance     Functional Status Assessment   Patient has had a recent decline in their functional status and demonstrates the ability to make significant improvements in function in a reasonable and predictable amount of time.     Equipment Recommendations   None recommended by OT     Recommendations for Other Services         Precautions/Restrictions   Precautions Precautions: Fall Recall of Precautions/Restrictions: Intact Precaution/Restrictions Comments: watch SpO2 Restrictions Weight Bearing Restrictions Per Provider Order: No     Mobility Bed Mobility               General bed mobility  comments: in recliner    Transfers Overall transfer level: Needs assistance Equipment used: Rolling walker (2 wheels), None Transfers: Sit to/from Stand Sit to Stand: Supervision           General transfer comment: supervision for sfaety with standing by recliner, completed 10X STS at supervision level.      Balance Overall balance assessment: Needs assistance Sitting-balance support: No upper extremity supported, Feet supported Sitting balance-Leahy Scale: Good     Standing balance support: No upper extremity supported, During functional activity Standing balance-Leahy Scale: Fair Standing balance comment: stands with RW at supervision level with R prosthetic donned.                           ADL either performed or assessed with clinical judgement   ADL Overall ADL's : Needs assistance/impaired                                       General ADL Comments: set up/supervision for ADLs, good overall strength to complete tasks, limited by SOB and desats with activity to 80's. Able to don/doff prosthetic     Vision Baseline Vision/History: 1 Wears glasses Ability to See in Adequate Light: 0 Adequate Patient Visual Report: No change from baseline       Perception         Praxis         Pertinent Vitals/Pain Pain Assessment Pain Assessment: No/denies pain  Extremity/Trunk Assessment Upper Extremity Assessment Upper Extremity Assessment: Overall WFL for tasks assessed           Communication Communication Communication: No apparent difficulties   Cognition Arousal: Alert Behavior During Therapy: WFL for tasks assessed/performed Cognition: No apparent impairments                               Following commands: Intact       Cueing  General Comments   Cueing Techniques: Verbal cues  At rest SpO2 95% on 12L, 88% on 10L HFNC. HR 83; RR 25; BP 112/51. Ambulating SpO2 read 88-90% on 15L.   Exercises      Shoulder Instructions      Home Living Family/patient expects to be discharged to:: Private residence Living Arrangements: Alone Available Help at Discharge: Family;Available PRN/intermittently Type of Home: House Home Access: Stairs to enter Entrance Stairs-Number of Steps: 1+1 Entrance Stairs-Rails: None Home Layout: Two level;Bed/bath upstairs Alternate Level Stairs-Number of Steps: chair lift   Bathroom Shower/Tub: Chief Strategy Officer: Handicapped height Bathroom Accessibility: Yes How Accessible: Accessible via walker Home Equipment: Rolling Walker (2 wheels);Wheelchair - manual;Toilet riser;Cane - single point;Hand held shower head;Grab bars - tub/shower;Grab bars - toilet;Tub bench;Other (comment);Lift chair (knee scooter)   Additional Comments: Pt lives alone, has stair lift.      Prior Functioning/Environment Prior Level of Function : Independent/Modified Independent             Mobility Comments: Used prosthetic but had advanced past using walker since last admission ADLs Comments: ind    OT Problem List: Decreased strength;Decreased activity tolerance   OT Treatment/Interventions: Self-care/ADL training;Therapeutic exercise;Energy conservation;DME and/or AE instruction;Therapeutic activities;Patient/family education;Balance training      OT Goals(Current goals can be found in the care plan section)   Acute Rehab OT Goals Patient Stated Goal: to return home, improve activity tolerance OT Goal Formulation: With patient Time For Goal Achievement: 09/28/23 Potential to Achieve Goals: Good   OT Frequency:  Min 2X/week    Co-evaluation              AM-PAC OT "6 Clicks" Daily Activity     Outcome Measure Help from another person eating meals?: None Help from another person taking care of personal grooming?: A Little Help from another person toileting, which includes using toliet, bedpan, or urinal?: A Little Help from another person  bathing (including washing, rinsing, drying)?: A Little Help from another person to put on and taking off regular upper body clothing?: A Little Help from another person to put on and taking off regular lower body clothing?: A Little 6 Click Score: 19   End of Session Equipment Utilized During Treatment: Gait belt;Rolling walker (2 wheels) Nurse Communication: Mobility status  Activity Tolerance: Patient tolerated treatment well Patient left: in chair;with call bell/phone within reach  OT Visit Diagnosis: Unsteadiness on feet (R26.81);Other abnormalities of gait and mobility (R26.89);Muscle weakness (generalized) (M62.81)                Time: 1610-9604 OT Time Calculation (min): 29 min Charges:  OT General Charges $OT Visit: 1 Visit OT Evaluation $OT Eval Low Complexity: 1 Low OT Treatments $Self Care/Home Management : 8-22 mins  [address] OTR/L   Alexis Goodell 09/14/2023, 2:06 PM

## 2023-09-14 NOTE — Progress Notes (Signed)
 NAME:  Shelly [name], MRN:  [id number], DOB:  [birth year], LOS: 3 ADMISSION DATE:  09/11/2023, CONSULTATION DATE:  09/11/23 REFERRING MD:  Dr. Katrinka Blazing, CHIEF COMPLAINT:  SOB   History of Present Illness:   7 yoF with PMH of HTN, HLD, PAD, Right [place] artery stenosis, asthma, RLE avascular necrosis s/p BKA, alzheimers dementia, and former smoker presenting from home with recurrent SOB and hypoxia.  Recently hospitalized 3/26- 4/2 for hypoxic respiratory failure felt related to rhinovirus, RLL strep PNA +/- aspiration PNA (given N/V), possible AECOPD.  Discharged home on 3-4L [place] with 3 days left on steroid taper.    Felt initially better but with progressive SOB, found hypoxic in the 60's on HOT w/ EMS improved on HFNC.  No reported recent fevers, increased cough, productive at times, denies any dysphagia, and complains slightly of left calf pain.  Placed on BiPAP for increased WOB in ER, requiring 60%.  Afebrile, low to normotensive BP.  CXR showing multifocal infiltrates.  SARS/ flu/ RSV neg.  BNP 469, trop hs flat x3 in 20-30's.  PCT 0.13, WBC 18.2.  Empiric vancomycin and cefepime started.  TRH called for admit.  Lactic increased 1.4> 2.  CTA chest showed few scattered segmental PE on left, mild clot burden but changing parenchymal lung opacities of interstitial septal thickening GGO, more left sided with several enlarged mediastinal nodules.  Since in ER awaiting bed, unable to tolerate BiPAP wean due to WOB and hypoxia.  PCCM called for further evaluation.   Pertinent  Medical History  HTN, HLD, PAD, Right [place] artery stenosis, asthma, RLE avascular necrosis s/p BKA, alzheimers dementia, former smoker  Significant Hospital Events: Including procedures, antibiotic start and stop dates in addition to other pertinent events   4/7 Admit recurrent hypoxic respiratory failure, PE, GGO on bipap  Interim History / Subjective:  [place] HFNC 10 l/m sats 96%  Objective   Blood pressure (!) 123/55, pulse 71,  temperature 98.7 F (37.1 C), resp. rate (!) 23, height 5\' 5"  (1.651 m), weight 68.1 kg, SpO2 (!) 89%.        Intake/Output Summary (Last 24 hours) at 09/14/2023 1332 Last data filed at 09/13/2023 1800 Gross per 24 hour  Intake 264.69 ml  Output 225 ml  Net 39.69 ml   Filed Weights   09/12/23 0424 09/13/23 0500 09/14/23 0500  Weight: 67.3 kg 67.3 kg 68.1 kg   Examination: General:   elderly female in NAD HEENT: MM pink/moist; [place] in place Neuro: Aox3; MAE CV: s1s2, RRR, no m/r/g PULM:  dim clear BS bilaterally; salter [place] 10 l/m GI: soft, bsx4 active  Extremities: warm/dry  Resolved Hospital Problem list    Assessment & Plan:  Recurrent hypoxic respiratory failure with increased bilateral GGO opacities, L> R - recent sed rate 68 on 4/1.  Patulous esophagus on chest CT with calcified node in subcarinal and left hilar c/w old granulomatous disease - recent tx rhinovirus, RLL strep pna (completed 4/2), and steroids (completed 4/5) - ddx include infectious vs inflammatory/ underlying lung process vs edema component.  Possible ongoing aspiration component as well.  Worsening leukocytosis but recently completed steroids, remains afebrile and PCT low.  PE not significant enough to be contributing to WOB/ hypoxia. Hx of COPD P:  -cont to wean salter hfnc for sats >92% -cont zosyn; follow cultures -pulm toiletry: Flutter/is -PT/OT/OOB -cont triple therapy nebs; prn duoneb -cont mucinex -legionella/strep negative -cont slow steroid taper  Concern for aspiration P: -SLP following  Segmental left  PE LLE dvt - L sided, mild clot burden, no evidence of heart strain on CT P: -eliquis  HFpEF HTN HLD Alzheimer's Macrocytic anemia P: -per primary  Best Practice (right click and "Reselect all SmartList Selections" daily)   Per primary  Critical care time:    Margot [facility] 09/14/2023, 1:32 PM  Please see [email or web address] for pager  details.  From 7A-7P if no response, please call [phone]. After hours, please call ELink [phone].

## 2023-09-14 NOTE — Progress Notes (Addendum)
 PROGRESS NOTE        PATIENT DETAILS Name: Shelly [name] Age: 73 y.o. Sex: female Date of Birth: [birth year] Admit Date: 09/11/2023 Admitting Physician Lorin Glass, MD ZOX:[id number], Sonny Masters, MD  Brief Summary: Patient is a 73 y.o.  female with history of dementia, right BKA, HTN, HLD, COPD-who was hospitalized from 3/26-4/2 for acute hypoxic respiratory failure secondary to rhinovirus/strep pneumonia infection-patient was stabilized-discharged on home oxygen-presented to the ED on 4/7 with worsening shortness of breath-found to be severely hypoxemic-CTA chest showed a small PE-she was started on heated high flow and subsequently admitted by the hospitalist service but due to worsening hypoxemia-she was transferred to the ICU for close monitoring.  She was stabilized and transferred back to [facility] on 4/10.  Significant events: 3/26-4/2>> hospitalization for hypoxia-rhinovirus/strep pneumo antigen positive-discharged on home O2 4/7>> worsening shortness of breath-worsening hypoxemia-admit to [facility] but later transferred to ICU 4/10>> transferred to [facility]  Significant studies: 4/7>> CTA chest: Few scattered segmental PE medial left upper lobe/middle lobe-changing parenchymal lung opacities with areas of interstitial septal thickening and groundglass opacities. 4/8>> echo: EF 65-70% 4/8>> B/L lower extremity Doppler: Age indeterminate DVT left peroneal vein  Significant microbiology data: 4/7>> COVID/influenza/RSV PCR: Negative 4/7>> sputum culture: Pending 4/7>> blood culture: No growth  Procedures: None  Consults: PCCM  Subjective: Some back pain-Down to 12 L of HFNC.  Objective: Vitals: Blood pressure (!) 112/51, pulse 71, temperature 98.1 F (36.7 C), temperature source Axillary, resp. rate (!) 25, height 5\' 5"  (1.651 m), weight 68.1 kg, SpO2 (!) 89%.   Exam: Gen Exam:Alert awake-not in any distress HEENT:atraumatic, normocephalic Chest: B/L clear to  auscultation anteriorly CVS:S1S2 regular Abdomen:soft non tender, non distended Extremities:no edema Neurology: Non focal Skin: no rash  Pertinent Labs/Radiology:    Latest Ref Rng & Units 09/14/2023    4:40 AM 09/13/2023    4:37 AM 09/12/2023    5:11 AM  CBC  WBC 4.0 - 10.5 K/uL 11.9  11.5  11.0   Hemoglobin 12.0 - 15.0 g/dL 8.6  8.5  8.9   Hematocrit 36.0 - 46.0 % 26.9  26.1  27.2   Platelets 150 - 400 K/uL 338  321  281     Lab Results  Component Value Date   NA 139 09/14/2023   K 3.6 09/14/2023   CL 100 09/14/2023   CO2 32 09/14/2023      Assessment/Plan: Acute hypoxic respiratory failure secondary to recent rhinovirus/strep pneumoniae-in now small PE Slowly improving-Down to 12 L of HFNC Remains on Zosyn Slow steroid taper Continue anticoagulation Continue bronchodilators Volume status stable-does not require diuretics but will aim to keep in negative balance Appreciate SLP input-no obvious signs of aspiration.  Remains on dysphagia 3 diet. Incentive spirometry/flutter valve Mobilize with PT/OT May require repeat ambulatory O2 sat-possible that may require more oxygen than discharged on recently.  Segmental left-sided pulmonary embolism with left peroneal vein DVT Continue Eliquis  Normocytic anemia Likely due to critical illness No evidence of blood loss Folate/vitamin B12 level stable. Follow CBC  Chronic HFpEF Euvolemic As needed diuretics  COPD Not in exacerbation Continue bronchodilators  HLD Statin  HTN BP stable-losartan remains on hold  Mood disorder Stable Continue Wellbutrin/Prozac  Dementia Delirium precautions Aricept  History of right BKA-uses prosthesis  Code status:   Code Status: Full Code   DVT Prophylaxis: apixaban (  ELIQUIS) tablet 10 mg  apixaban (ELIQUIS) tablet 5 mg     Family Communication: Daughter-[phone]-updated 4/10   Disposition Plan: Status is: Inpatient Remains inpatient appropriate  because: Severity of illness   Planned Discharge Destination:Home   Diet: Diet Order             DIET DYS 3 Room service appropriate? Yes with Assist; Fluid consistency: Thin  Diet effective now                     Antimicrobial agents: Anti-infectives (From admission, onward)    Start     Dose/Rate Route Frequency Ordered Stop   09/11/23 2200  vancomycin (VANCOREADY) IVPB 750 mg/150 mL  Status:  Discontinued        750 mg 150 mL/hr over 60 Minutes Intravenous Every 12 hours 09/11/23 0926 09/12/23 0958   09/11/23 2200  piperacillin-tazobactam (ZOSYN) IVPB 3.375 g       Placed in "Followed by" Linked Group   3.375 g 12.5 mL/hr over 240 Minutes Intravenous Every 8 hours 09/11/23 1549     09/11/23 1600  azithromycin (ZITHROMAX) 500 mg in sodium chloride 0.9 % 250 mL IVPB        500 mg 250 mL/hr over 60 Minutes Intravenous Every 24 hours 09/11/23 1547 09/14/23 0857   09/11/23 1600  piperacillin-tazobactam (ZOSYN) IVPB 3.375 g       Placed in "Followed by" Linked Group   3.375 g 100 mL/hr over 30 Minutes Intravenous  Once 09/11/23 1549 09/11/23 2000   09/11/23 1200  ceFEPIme (MAXIPIME) 2 g in sodium chloride 0.9 % 100 mL IVPB  Status:  Discontinued        2 g 200 mL/hr over 30 Minutes Intravenous Every 8 hours 09/11/23 0926 09/11/23 1549   09/11/23 0615  vancomycin (VANCOCIN) IVPB 1000 mg/200 mL premix  Status:  Discontinued        1,000 mg 200 mL/hr over 60 Minutes Intravenous  Once 09/11/23 0603 09/11/23 0605   09/11/23 0615  ceFEPIme (MAXIPIME) 2 g in sodium chloride 0.9 % 100 mL IVPB        2 g 200 mL/hr over 30 Minutes Intravenous  Once 09/11/23 0603 09/11/23 0706   09/11/23 0615  vancomycin (VANCOREADY) IVPB 1500 mg/300 mL        1,500 mg 150 mL/hr over 120 Minutes Intravenous  Once 09/11/23 0605 09/11/23 0920        MEDICATIONS: Scheduled Meds:  apixaban  10 mg Oral BID   Followed by   Melene Muller ON 09/20/2023] apixaban  5 mg Oral BID   arformoterol  15 mcg  Nebulization BID   budesonide (PULMICORT) nebulizer solution  0.5 mg Nebulization BID   buPROPion  300 mg Oral Daily   Chlorhexidine Gluconate Cloth  6 each Topical Q0600   donepezil  10 mg Oral QHS   FLUoxetine  20 mg Oral Daily   guaiFENesin  600 mg Oral BID   melatonin  3 mg Oral QHS   montelukast  10 mg Oral QHS   pantoprazole  40 mg Oral Daily   predniSONE  60 mg Oral QPC breakfast   Followed by   Melene Muller ON 09/16/2023] predniSONE  50 mg Oral QPC breakfast   Followed by   Melene Muller ON 09/19/2023] predniSONE  40 mg Oral QPC breakfast   Followed by   Melene Muller ON 09/22/2023] predniSONE  20 mg Oral QPC breakfast   Followed by   Melene Muller ON 09/25/2023] predniSONE  10  mg Oral QPC breakfast   revefenacin  175 mcg Nebulization Daily   rosuvastatin  10 mg Oral QHS   sodium chloride flush  3 mL Intravenous Q12H   Continuous Infusions:  piperacillin-tazobactam (ZOSYN)  IV 3.375 g (09/14/23 0651)   PRN Meds:.acetaminophen **OR** acetaminophen, benzocaine, ipratropium-albuterol, ondansetron **OR** ondansetron (ZOFRAN) IV   I have personally reviewed following labs and imaging studies  LABORATORY DATA: CBC: Recent Labs  Lab 09/11/23 0541 09/11/23 0550 09/12/23 0511 09/13/23 0437 09/14/23 0440  WBC 18.2*  --  11.0* 11.5* 11.9*  NEUTROABS 16.0*  --   --   --   --   HGB 10.7* 11.6* 8.9* 8.5* 8.6*  HCT 34.5* 34.0* 27.2* 26.1* 26.9*  MCV 100.3*  --  95.8 95.6 97.5  PLT 297  --  281 321 338    Basic Metabolic Panel: Recent Labs  Lab 09/11/23 0541 09/11/23 0550 09/12/23 0511 09/13/23 0437 09/14/23 0440  NA 136 136 137 137 139  K 4.6 4.5 3.7 3.8 3.6  CL 101  --  94* 98 100  CO2 24  --  27 30 32  GLUCOSE 98  --  133* 165* 110*  BUN 12  --  16 17 20   CREATININE 0.77  --  0.77 0.58 0.62  CALCIUM 8.3*  --  8.5* 8.4* 8.7*    GFR: Estimated Creatinine Clearance: 55.5 mL/min (by C-G formula based on SCr of 0.62 mg/dL).  Liver Function Tests: Recent Labs  Lab 09/11/23 0541  AST 29   ALT 35  ALKPHOS 71  BILITOT 0.9  PROT 6.0*  ALBUMIN 2.4*   No results for input(s): "LIPASE", "AMYLASE" in the last 168 hours. No results for input(s): "AMMONIA" in the last 168 hours.  Coagulation Profile: Recent Labs  Lab 09/11/23 0541  INR 1.2    Cardiac Enzymes: No results for input(s): "CKTOTAL", "CKMB", "CKMBINDEX", "TROPONINI" in the last 168 hours.  BNP (last 3 results) No results for input(s): "PROBNP" in the last 8760 hours.  Lipid Profile: No results for input(s): "CHOL", "HDL", "LDLCALC", "TRIG", "CHOLHDL", "LDLDIRECT" in the last 72 hours.  Thyroid Function Tests: No results for input(s): "TSH", "T4TOTAL", "FREET4", "T3FREE", "THYROIDAB" in the last 72 hours.  Anemia Panel: Recent Labs    09/12/23 0511  VITAMINB12 1,285*  FOLATE 34.6    Urine analysis:    Component Value Date/Time   COLORURINE YELLOW 09/11/2023 0812   APPEARANCEUR HAZY (A) 09/11/2023 0812   LABSPEC 1.018 09/11/2023 0812   PHURINE 5.0 09/11/2023 0812   GLUCOSEU NEGATIVE 09/11/2023 0812   GLUCOSEU NEGATIVE 09/08/2010 1617   HGBUR NEGATIVE 09/11/2023 0812   BILIRUBINUR NEGATIVE 09/11/2023 0812   KETONESUR 20 (A) 09/11/2023 0812   PROTEINUR NEGATIVE 09/11/2023 0812   UROBILINOGEN 0.2 09/08/2010 1617   NITRITE NEGATIVE 09/11/2023 0812   LEUKOCYTESUR NEGATIVE 09/11/2023 0812    Sepsis Labs: Lactic Acid, Venous    Component Value Date/Time   LATICACIDVEN 1.0 09/12/2023 0511    MICROBIOLOGY: Recent Results (from the past 240 hours)  Blood Culture (routine x 2)     Status: None (Preliminary result)   Collection Time: 09/11/23  5:39 AM   Specimen: BLOOD  Result Value Ref Range Status   Specimen Description BLOOD BLOOD LEFT ARM  Final   Special Requests   Final    BOTTLES DRAWN AEROBIC AND ANAEROBIC Blood Culture results may not be optimal due to an inadequate volume of blood received in culture bottles   Culture   Final  NO GROWTH 3 DAYS Performed at [facility]  Lab, [address], [place], [place] [address]    Report Status PENDING  Incomplete  Resp panel by RT-PCR (RSV, Flu A&B, Covid) Anterior Nasal Swab     Status: None   Collection Time: 09/11/23  5:41 AM   Specimen: Anterior Nasal Swab  Result Value Ref Range Status   SARS Coronavirus 2 by RT PCR NEGATIVE NEGATIVE Final   Influenza A by PCR NEGATIVE NEGATIVE Final   Influenza B by PCR NEGATIVE NEGATIVE Final    Comment: (NOTE) The Xpert Xpress SARS-CoV-2/FLU/RSV plus assay is intended as an aid in the diagnosis of influenza from Nasopharyngeal swab specimens and should not be used as a sole basis for treatment. Nasal washings and aspirates are unacceptable for Xpert Xpress SARS-CoV-2/FLU/RSV testing.  Fact Sheet for Patients: BloggerCourse.com  Fact Sheet for Healthcare Providers: SeriousBroker.it  This test is not yet approved or cleared by the [place] FDA and has been authorized for detection and/or diagnosis of SARS-CoV-2 by FDA under an Emergency Use Authorization (EUA). This EUA will remain in effect (meaning this test can be used) for the duration of the COVID-19 declaration under Section 564(b)(1) of the Act, 21 U.S.C. section 360bbb-3(b)(1), unless the authorization is terminated or revoked.     Resp Syncytial Virus by PCR NEGATIVE NEGATIVE Final    Comment: (NOTE) Fact Sheet for Patients: BloggerCourse.com  Fact Sheet for Healthcare Providers: SeriousBroker.it  This test is not yet approved or cleared by the [place] FDA and has been authorized for detection and/or diagnosis of SARS-CoV-2 by FDA under an Emergency Use Authorization (EUA). This EUA will remain in effect (meaning this test can be used) for the duration of the COVID-19 declaration under Section 564(b)(1) of the Act, 21 U.S.C. section 360bbb-3(b)(1), unless the authorization is terminated  or revoked.  Performed at [facility], [address], [place], [place] [address]   Blood Culture (routine x 2)     Status: None (Preliminary result)   Collection Time: 09/11/23  6:06 AM   Specimen: BLOOD  Result Value Ref Range Status   Specimen Description BLOOD BLOOD LEFT HAND  Final   Special Requests   Final    BOTTLES DRAWN AEROBIC AND ANAEROBIC Blood Culture adequate volume   Culture   Final    NO GROWTH 3 DAYS Performed at [facility], [address] [place], [place] [address]    Report Status PENDING  Incomplete  Expectorated Sputum Assessment w Gram Stain, Rflx to Resp Cult     Status: None   Collection Time: 09/11/23  9:48 AM   Specimen: Sputum  Result Value Ref Range Status   Specimen Description SPUTUM  Final   Special Requests Normal  Final   Sputum evaluation   Final    THIS SPECIMEN IS ACCEPTABLE FOR SPUTUM CULTURE Performed at [facility], [address] [place], [place] [address]    Report Status 09/13/2023 FINAL  Final  Culture, Respiratory w Gram Stain     Status: None (Preliminary result)   Collection Time: 09/11/23  9:48 AM   Specimen: SPU  Result Value Ref Range Status   Specimen Description SPUTUM  Final   Special Requests Normal Reflexed from [id number]  Final   Gram Stain   Final    NO WBC SEEN RARE BUDDING YEAST SEEN Performed at [facility], [address], [place], [place] [address]    Culture  PENDING  Incomplete   Report Status PENDING  Incomplete  MRSA Next Gen by PCR, Nasal     Status: None   Collection Time: 09/11/23  5:39 PM   Specimen: Nasal Mucosa; Nasal Swab  Result Value Ref Range Status   MRSA by PCR Next Gen NOT DETECTED NOT DETECTED Final    Comment: (NOTE) The GeneXpert MRSA Assay (FDA approved for NASAL specimens only), is one component of a comprehensive MRSA colonization surveillance program. It is not intended to diagnose MRSA infection nor to guide or monitor treatment for MRSA  infections. Test performance is not FDA approved in patients less than 20 years old. Performed at [facility], [address] [place], [place] [address]     RADIOLOGY STUDIES/RESULTS: VAS [place] LOWER EXTREMITY VENOUS (DVT) Result Date: 09/12/2023  Lower Venous DVT Study Patient Name:  Aslee ARLETA OSTRUM  Date of Exam:   09/12/2023 Medical Rec #: [id number]          Accession #:    [id number] Date of Birth: [birth year]          Patient Gender: F Patient Age:   62 years Exam Location:  [facility] Procedure:      VAS [place] LOWER EXTREMITY VENOUS (DVT) Referring Phys: PAULA SIMPSON --------------------------------------------------------------------------------  Indications: SOB, and pulmonary embolism.  Comparison Study: Previous study on 10.4.2024. Performing Technologist: Fernande Bras  Examination Guidelines: A complete evaluation includes B-mode imaging, spectral Doppler, color Doppler, and power Doppler as needed of all accessible portions of each vessel. Bilateral testing is considered an integral part of a complete examination. Limited examinations for reoccurring indications may be performed as noted. The reflux portion of the exam is performed with the patient in reverse Trendelenburg.  +---------+---------------+---------+-----------+----------+--------------+ RIGHT    CompressibilityPhasicitySpontaneityPropertiesThrombus Aging +---------+---------------+---------+-----------+----------+--------------+ CFV      Full           Yes      Yes                                 +---------+---------------+---------+-----------+----------+--------------+ SFJ      Full           Yes      Yes                                 +---------+---------------+---------+-----------+----------+--------------+ FV Prox  Full                                                        +---------+---------------+---------+-----------+----------+--------------+ FV Mid   Full                                                         +---------+---------------+---------+-----------+----------+--------------+ FV DistalFull                                                        +---------+---------------+---------+-----------+----------+--------------+  PFV      Full                                                        +---------+---------------+---------+-----------+----------+--------------+ POP      Full           Yes      Yes                                 +---------+---------------+---------+-----------+----------+--------------+ Right BKA  +---------+---------------+---------+-----------+----------+--------------+ LEFT     CompressibilityPhasicitySpontaneityPropertiesThrombus Aging +---------+---------------+---------+-----------+----------+--------------+ CFV      Full           Yes      Yes                                 +---------+---------------+---------+-----------+----------+--------------+ SFJ      Full           Yes      Yes                                 +---------+---------------+---------+-----------+----------+--------------+ FV Prox  Full                                                        +---------+---------------+---------+-----------+----------+--------------+ FV Mid   Full                                                        +---------+---------------+---------+-----------+----------+--------------+ FV DistalFull                                                        +---------+---------------+---------+-----------+----------+--------------+ PFV      Full                                                        +---------+---------------+---------+-----------+----------+--------------+ POP      Full           Yes      Yes                                 +---------+---------------+---------+-----------+----------+--------------+ PTV      Full                                                         +---------+---------------+---------+-----------+----------+--------------+  PERO     None           No       No                                  +---------+---------------+---------+-----------+----------+--------------+ Only one of the paired peroneal veins is thrombosed.    Summary: RIGHT: - There is no evidence of deep vein thrombosis in the lower extremity.  - No cystic structure found in the popliteal fossa.  LEFT: - Findings consistent with age indeterminate deep vein thrombosis involving the left peroneal veins.  - No cystic structure found in the popliteal fossa.  *See table(s) above for measurements and observations. Electronically signed by Lemar Livings MD on 09/12/2023 at 7:46:34 PM.    Final      LOS: 3 days   Jeoffrey Massed, MD  [facility]    To contact the attending provider between 7A-7P or the covering provider during after hours 7P-7A, please log into the web site [email or web address] and access using universal [place] password for that web site. If you do not have the password, please call the hospital operator.  09/14/2023, 9:59 AM

## 2023-09-14 NOTE — Care Management Important Message (Signed)
 Important Message  Patient Details  Name: Shelly [name] MRN: [id number] Date of Birth: [birth year]   Important Message Given:  Yes - Medicare IM     Dorena Bodo 09/14/2023, 2:22 PM

## 2023-09-14 NOTE — Evaluation (Signed)
 Modified Barium Swallow Study  Patient Details  Name: Shelly [name] MRN: [id number] Date of Birth: 06-19-1950  Today's Date: 09/14/2023  Modified Barium Swallow completed.  Full report located under Chart Review in the Imaging Section.  History of Present Illness 32 yoF presenting from home with recurrent SOB and hypoxia.  Recently hospitalized 3/26- 4/2 for hypoxic respiratory failure felt related to rhinovirus, RLL strep PNA +/- aspiration PNA (given N/V), possible AECOPD.  Discharged home on 3-4L [place] with 3 days left on steroid taper. Felt initially better but with progressive SOB, found hypoxic in the 60's on HOT w/ EMS improved on HFNC.  No reported recent fevers, increased cough, productive at times, denies any dysphagia, and complains slightly of left calf pain.  Placed on BiPAP for increased WOB in ER, requiring 60%.  Afebrile, low to normotensive BP.  CXR showing multifocal infiltrates.  Since in ER awaiting bed, unable to tolerate BiPAP wean due to WOB and hypoxia. CT angiogram of the chest revealed concerns for scattered left-sided pulmonary emboli with mild clot burden, and changing parenchymal lung opacities with areas of tissue and septal thickening left lung worse than the right. Pt evalauted by SLP in initial admission, no finding of dysphagia.   Clinical Impression Pt presents with swallowing grossly WFL. Oral phase of swallow featured brisk bolus transit and complete clearance of bolus with no residue. Pt displayed consistent delayed swallow initiation at level of pyriform sinuses, which resulted in a single event of flash penetration with thin-liquids during multiple sips; pt states that she typically takes single sips when she drinks. Chin-tuck appeared to be effective for providing additional support for airway safety prior to swallow initiation. Hyolaryngeal excursion was adequate with complete epiglottic inversion. Pharyngeal residue was only trace. Tongue base retraction also  appeared WFL. An esophageal sweep was completed with complete clearance of bolus and pill. Due to pt healing from oral lesions and lack of dentures during study (pt has dentures typically), only a small piece of solid was presented. Pt masticated solid completely and had no issues with the swallow.   Recommend a dysphagia 3 (mech soft) and thin liquid diet. SLP will f/u to complete a diet check and to provide education about compensatory strategies to optimize pt's wellbeing.  Factors that may increase risk of adverse event in presence of aspiration Rubye Oaks & Clearance Coots 2021): Respiratory or GI disease  Swallow Evaluation Recommendations Recommendations: PO diet PO Diet Recommendation: Dysphagia 3 (Mechanical soft);Thin liquids (Level 0) Liquid Administration via: Cup;Straw Medication Administration: Whole meds with liquid Supervision: Patient able to self-feed Swallowing strategies  : Slow rate;Chin tuck Postural changes: Position pt fully upright for meals Oral care recommendations: Oral care BID (2x/day)      Rowe Robert 09/14/2023,2:27 PM

## 2023-09-14 NOTE — Progress Notes (Signed)
   09/14/23 2033  BiPAP/CPAP/SIPAP  BiPAP/CPAP/SIPAP Pt Type Adult  Reason BIPAP/CPAP not in use Non-compliant  BiPAP/CPAP /SiPAP Vitals  Pulse Rate 73  SpO2 97 %  Bilateral Breath Sounds Clear;Diminished  MEWS Score/Color  MEWS Score 2  MEWS Score Color Yellow

## 2023-09-14 NOTE — Progress Notes (Signed)
 Pharmacy Antibiotic Note  Shelly [name] is a 73 y.o. female admitted on 09/11/2023 with pneumonia.  Pharmacy was consulted on 09/11/23  for Zosyn dosing for aspiration pneumonia.  Renal function is stable with SCr 0.62, CrCl 55 ml/min.   Acute hypoxic respiratory failure secondary to recent rhinovirus/strep pneumoniae-in now small PE.  MD notes patient slowly improving-Down to 12 L of HFNC.  Remains on Zosyn.  Slow steroid taper  4/7 Sputum cx: GNR, pending 4/7 Blood cx no growth to date, pending  Plan: Continue Zosyn 3.375 g IV every 8 hours (extended 4 hour infusion) Monitor clinical status, renal function and culture results daily.    Height: 5\' 5"  (165.1 cm) Weight: 68.1 kg (150 lb 2.1 oz) IBW/kg (Calculated) : 57  Temp (24hrs), Avg:97.9 F (36.6 C), Min:97.4 F (36.3 C), Max:98.2 F (36.8 C)  Recent Labs  Lab 09/11/23 0541 09/11/23 0552 09/11/23 0733 09/12/23 0511 09/13/23 0437 09/14/23 0440  WBC 18.2*  --   --  11.0* 11.5* 11.9*  CREATININE 0.77  --   --  0.77 0.58 0.62  LATICACIDVEN  --  1.4 2.0* 1.0  --   --     Estimated Creatinine Clearance: 55.5 mL/min (by C-G formula based on SCr of 0.62 mg/dL).    Allergies  Allergen Reactions   Delsym [Dextromethorphan] Other (See Comments)    Cognitive impairment   Antimicrobials:  Zosyn 4/7 > Azithro 4/7 > (4/10) Vanc 4/7 > 4/8 Cefepime 4/7   Microbiology: 4/7 Bcx ngtd 4/7 MRSA (-) 4/7 step pneumo urAg (-) 4/7 legionella ip 4/7 Sputum cx: GNR, pending 4/7  Resp pcr: neg    Thank you for allowing pharmacy to be a part of this patient's care.  Noah Delaine, RPh Clinical Pharmacist 09/14/2023 12:05 PM

## 2023-09-15 ENCOUNTER — Inpatient Hospital Stay (HOSPITAL_COMMUNITY)

## 2023-09-15 DIAGNOSIS — J189 Pneumonia, unspecified organism: Secondary | ICD-10-CM | POA: Diagnosis not present

## 2023-09-15 DIAGNOSIS — I2609 Other pulmonary embolism with acute cor pulmonale: Secondary | ICD-10-CM | POA: Diagnosis not present

## 2023-09-15 DIAGNOSIS — I2699 Other pulmonary embolism without acute cor pulmonale: Secondary | ICD-10-CM | POA: Diagnosis not present

## 2023-09-15 DIAGNOSIS — J9601 Acute respiratory failure with hypoxia: Secondary | ICD-10-CM | POA: Diagnosis not present

## 2023-09-15 DIAGNOSIS — I5189 Other ill-defined heart diseases: Secondary | ICD-10-CM | POA: Diagnosis not present

## 2023-09-15 LAB — CBC
HCT: 28.9 % — ABNORMAL LOW (ref 36.0–46.0)
Hemoglobin: 9.2 g/dL — ABNORMAL LOW (ref 12.0–15.0)
MCH: 31 pg (ref 26.0–34.0)
MCHC: 31.8 g/dL (ref 30.0–36.0)
MCV: 97.3 fL (ref 80.0–100.0)
Platelets: 330 10*3/uL (ref 150–400)
RBC: 2.97 MIL/uL — ABNORMAL LOW (ref 3.87–5.11)
RDW: 13.7 % (ref 11.5–15.5)
WBC: 11.1 10*3/uL — ABNORMAL HIGH (ref 4.0–10.5)
nRBC: 0 % (ref 0.0–0.2)

## 2023-09-15 LAB — BASIC METABOLIC PANEL WITH GFR
Anion gap: 11 (ref 5–15)
BUN: 19 mg/dL (ref 8–23)
CO2: 30 mmol/L (ref 22–32)
Calcium: 8.7 mg/dL — ABNORMAL LOW (ref 8.9–10.3)
Chloride: 98 mmol/L (ref 98–111)
Creatinine, Ser: 0.64 mg/dL (ref 0.44–1.00)
GFR, Estimated: >60 mL/min (ref >60)
Glucose, Bld: 78 mg/dL (ref 70–99)
Potassium: 3.9 mmol/L (ref 3.5–5.1)
Sodium: 139 mmol/L (ref 135–145)

## 2023-09-15 LAB — SCLERODERMA DIAGNOSTIC PROFILE
Anti Nuclear Antibody (ANA): NEGATIVE
Scleroderma (Scl-70) (ENA) Antibody, IgG: <0.2 AI (ref 0.0–0.9)

## 2023-09-15 LAB — RHEUMATOID FACTOR: Rheumatoid fact SerPl-aCnc: <10 [IU]/mL (ref <14.0)

## 2023-09-15 LAB — RETICULOCYTES
Immature Retic Fract: 8.4 % (ref 2.3–15.9)
RBC.: 2.95 MIL/uL — ABNORMAL LOW (ref 3.87–5.11)
Retic Count, Absolute: 63.4 10*3/uL (ref 19.0–186.0)
Retic Ct Pct: 2.2 % (ref 0.4–3.1)

## 2023-09-15 LAB — CYCLIC CITRUL PEPTIDE ANTIBODY, IGG/IGA: CCP Antibodies IgG/IgA: 6 U (ref 0–19)

## 2023-09-15 LAB — IRON AND TIBC
Iron: 42 ug/dL (ref 28–170)
Saturation Ratios: 22 % (ref 10.4–31.8)
TIBC: 190 ug/dL — ABNORMAL LOW (ref 250–450)
UIBC: 148 ug/dL

## 2023-09-15 LAB — FERRITIN: Ferritin: 117 ng/mL (ref 11–307)

## 2023-09-15 LAB — ANA W/REFLEX IF POSITIVE: Anti Nuclear Antibody (ANA): NEGATIVE

## 2023-09-15 LAB — SJOGRENS SYNDROME-A EXTRACTABLE NUCLEAR ANTIBODY: SSA (Ro) (ENA) Antibody, IgG: <0.2 AI (ref 0.0–0.9)

## 2023-09-15 LAB — SJOGRENS SYNDROME-B EXTRACTABLE NUCLEAR ANTIBODY: SSB (La) (ENA) Antibody, IgG: <0.2 AI (ref 0.0–0.9)

## 2023-09-15 MED ORDER — FUROSEMIDE 10 MG/ML IJ SOLN
40.0000 mg | Freq: Two times a day (BID) | INTRAMUSCULAR | Status: DC
  Administered 2023-09-15: 40 mg via INTRAVENOUS
  Filled 2023-09-15: qty 4

## 2023-09-15 MED ORDER — METHYLPREDNISOLONE SODIUM SUCC 125 MG IJ SOLR
60.0000 mg | Freq: Two times a day (BID) | INTRAMUSCULAR | Status: AC
  Administered 2023-09-15 – 2023-09-19 (×9): 60 mg via INTRAVENOUS
  Filled 2023-09-15 (×9): qty 2

## 2023-09-15 NOTE — Progress Notes (Addendum)
 PROGRESS NOTE        PATIENT DETAILS Name: Shelly [name] Age: 73 y.o. Sex: female Date of Birth: August 29, 1950 Admit Date: 09/11/2023 Admitting Physician Shelly Glass, MD NGE:[id number], Shelly Masters, MD  Brief Summary: Patient is a 73 y.o.  female with history of dementia, right BKA, HTN, HLD, COPD-who was hospitalized from 3/26-4/2 for acute hypoxic respiratory failure secondary to rhinovirus/strep pneumonia infection-patient was stabilized-discharged on home oxygen-presented to the ED on 4/7 with worsening shortness of breath-found to be severely hypoxemic-CTA chest showed a small PE-she was started on heated high flow and subsequently admitted by the hospitalist service but due to worsening hypoxemia-she was transferred to the ICU for close monitoring.  She was stabilized and transferred back to [facility] on 4/10.  Significant events: 3/26-4/2>> hospitalization for hypoxia-rhinovirus/strep pneumo antigen positive-discharged on home O2 4/7>> worsening shortness of breath-worsening hypoxemia-admit to [facility] but later transferred to ICU 4/10>> transferred to [facility] 4/11>> O2 saturation in the 80s on 15 L of HFNC-CXR unchanged-switched to heated high flow-IV Lasix started-prednisone switched to Solu-Medrol.  Significant studies: 4/7>> CTA chest: Few scattered segmental PE medial left upper lobe/middle lobe-changing parenchymal lung opacities with areas of interstitial septal thickening and groundglass opacities. 4/8>> echo: EF 65-70% 4/8>> B/L lower extremity Doppler: Age indeterminate DVT left peroneal vein  Significant microbiology data: 4/7>> COVID/influenza/RSV PCR: Negative 4/7>> sputum culture: Pending 4/7>> blood culture: No growth  Procedures: None  Consults: PCCM  Subjective: More short of breath this morning-gets easily winded with just moving around in bed.  Switched to heated high flow this morning.  Objective: Vitals: Blood pressure (!) 113/48, pulse 75,  temperature 98.1 F (36.7 C), temperature source Oral, resp. rate (!) 26, height 5\' 5"  (1.651 m), weight 68.1 kg, SpO2 95%.   Exam: Gen Exam:Alert awake-not in any distress HEENT:atraumatic, normocephalic Chest: B/L clear to auscultation anteriorly CVS:S1S2 regular Abdomen:soft non tender, non distended Extremities:no edema Neurology: Non focal Skin: no rash  Pertinent Labs/Radiology:    Latest Ref Rng & Units 09/15/2023    5:51 AM 09/14/2023    4:40 AM 09/13/2023    4:37 AM  CBC  WBC 4.0 - 10.5 K/uL 11.1  11.9  11.5   Hemoglobin 12.0 - 15.0 g/dL 9.2  8.6  8.5   Hematocrit 36.0 - 46.0 % 28.9  26.9  26.1   Platelets 150 - 400 K/uL 330  338  321     Lab Results  Component Value Date   NA 139 09/15/2023   K 3.9 09/15/2023   CL 98 09/15/2023   CO2 30 09/15/2023     Assessment/Plan: Acute hypoxic respiratory failure secondary to recent rhinovirus/strep pneumoniae-possible interstitial lung disease related to recent pneumonia-and now small PE Hypoxia worse today-switch to heated high flow CXR today essentially unchanged Discussed with PCCM Dr. Sharla Kidney prednisone to Solu-Medrol, although not volume overloaded-will give a few days of IV Lasix to ensure negative balance Continue anticoagulation Continue bronchodilators Continue Zosyn x 7 days total Incentive spirometry/flutter valve Mobilize with PT/OT Appreciate SLP input-no obvious signs of aspiration.  Remains on dysphagia 3 diet. Incentive spirometry/flutter valve  May require repeat ambulatory O2 sat-possible that may require more oxygen than discharged on recently.  Segmental left-sided pulmonary embolism with left peroneal vein DVT Continue Eliquis  Normocytic anemia Likely due to critical illness No evidence of blood loss Folate/vitamin B12 level stable.  Follow CBC  Chronic HFpEF Euvolemic Starting IV diuretics to ensure negative balance given worsening hypoxemia.  COPD Not in exacerbation Continue  bronchodilators  HLD Statin  HTN BP stable-losartan remains on hold  Mood disorder Stable Continue Wellbutrin/Prozac  Dementia Delirium precautions Aricept  History of right BKA-uses prosthesis  Code status:   Code Status: Full Code   DVT Prophylaxis: apixaban (ELIQUIS) tablet 10 mg  apixaban (ELIQUIS) tablet 5 mg    Family Communication: Daughter-Shelly [name]-updated 4/11  Disposition Plan: Status is: Inpatient Remains inpatient appropriate because: Severity of illness   Planned Discharge Destination:Home  The patient is critically ill with multiple organ system failure and requires high complexity decision making for assessment and support, frequent evaluation and titration of therapies, advanced monitoring, review of radiographic studies and interpretation of complex data.   Total Critical time spent equals 45 minutes    Diet: Diet Order             DIET DYS 3 Room service appropriate? Yes with Assist; Fluid consistency: Thin  Diet effective now                     Antimicrobial agents: Anti-infectives (From admission, onward)    Start     Dose/Rate Route Frequency Ordered Stop   09/11/23 2200  vancomycin (VANCOREADY) IVPB 750 mg/150 mL  Status:  Discontinued        750 mg 150 mL/hr over 60 Minutes Intravenous Every 12 hours 09/11/23 0926 09/12/23 0958   09/11/23 2200  piperacillin-tazobactam (ZOSYN) IVPB 3.375 g       Placed in "Followed by" Linked Group   3.375 g 12.5 mL/hr over 240 Minutes Intravenous Every 8 hours 09/11/23 1549     09/11/23 1600  azithromycin (ZITHROMAX) 500 mg in sodium chloride 0.9 % 250 mL IVPB        500 mg 250 mL/hr over 60 Minutes Intravenous Every 24 hours 09/11/23 1547 09/14/23 0857   09/11/23 1600  piperacillin-tazobactam (ZOSYN) IVPB 3.375 g       Placed in "Followed by" Linked Group   3.375 g 100 mL/hr over 30 Minutes Intravenous  Once 09/11/23 1549 09/11/23 2000   09/11/23 1200  ceFEPIme (MAXIPIME) 2 g  in sodium chloride 0.9 % 100 mL IVPB  Status:  Discontinued        2 g 200 mL/hr over 30 Minutes Intravenous Every 8 hours 09/11/23 0926 09/11/23 1549   09/11/23 0615  vancomycin (VANCOCIN) IVPB 1000 mg/200 mL premix  Status:  Discontinued        1,000 mg 200 mL/hr over 60 Minutes Intravenous  Once 09/11/23 0603 09/11/23 0605   09/11/23 0615  ceFEPIme (MAXIPIME) 2 g in sodium chloride 0.9 % 100 mL IVPB        2 g 200 mL/hr over 30 Minutes Intravenous  Once 09/11/23 0603 09/11/23 0706   09/11/23 0615  vancomycin (VANCOREADY) IVPB 1500 mg/300 mL        1,500 mg 150 mL/hr over 120 Minutes Intravenous  Once 09/11/23 0605 09/11/23 0920        MEDICATIONS: Scheduled Meds:  apixaban  10 mg Oral BID   Followed by   Melene Muller ON 09/20/2023] apixaban  5 mg Oral BID   arformoterol  15 mcg Nebulization BID   budesonide (PULMICORT) nebulizer solution  0.5 mg Nebulization BID   buPROPion  300 mg Oral Daily   Chlorhexidine Gluconate Cloth  6 each Topical Q0600   donepezil  10 mg Oral  QHS   FLUoxetine  20 mg Oral Daily   furosemide  40 mg Intravenous BID   guaiFENesin  600 mg Oral BID   melatonin  3 mg Oral QHS   methylPREDNISolone (SOLU-MEDROL) injection  60 mg Intravenous Q12H   montelukast  10 mg Oral QHS   pantoprazole  40 mg Oral Daily   revefenacin  175 mcg Nebulization Daily   rosuvastatin  10 mg Oral QHS   sodium chloride flush  3 mL Intravenous Q12H   Continuous Infusions:  sodium chloride 10 mL/hr at 09/14/23 1504   piperacillin-tazobactam (ZOSYN)  IV 3.375 g (09/15/23 0431)   PRN Meds:.sodium chloride, acetaminophen **OR** acetaminophen, benzocaine, ipratropium-albuterol, ondansetron **OR** ondansetron (ZOFRAN) IV   I have personally reviewed following labs and imaging studies  LABORATORY DATA: CBC: Recent Labs  Lab 09/11/23 0541 09/11/23 0550 09/12/23 0511 09/13/23 0437 09/14/23 0440 09/15/23 0551  WBC 18.2*  --  11.0* 11.5* 11.9* 11.1*  NEUTROABS 16.0*  --   --   --    --   --   HGB 10.7* 11.6* 8.9* 8.5* 8.6* 9.2*  HCT 34.5* 34.0* 27.2* 26.1* 26.9* 28.9*  MCV 100.3*  --  95.8 95.6 97.5 97.3  PLT 297  --  281 321 338 330    Basic Metabolic Panel: Recent Labs  Lab 09/11/23 0541 09/11/23 0550 09/12/23 0511 09/13/23 0437 09/14/23 0440 09/15/23 0551  NA 136 136 137 137 139 139  K 4.6 4.5 3.7 3.8 3.6 3.9  CL 101  --  94* 98 100 98  CO2 24  --  27 30 32 30  GLUCOSE 98  --  133* 165* 110* 78  BUN 12  --  16 17 20 19   CREATININE 0.77  --  0.77 0.58 0.62 0.64  CALCIUM 8.3*  --  8.5* 8.4* 8.7* 8.7*    GFR: Estimated Creatinine Clearance: 55.5 mL/min (by C-G formula based on SCr of 0.64 mg/dL).  Liver Function Tests: Recent Labs  Lab 09/11/23 0541  AST 29  ALT 35  ALKPHOS 71  BILITOT 0.9  PROT 6.0*  ALBUMIN 2.4*   No results for input(s): "LIPASE", "AMYLASE" in the last 168 hours. No results for input(s): "AMMONIA" in the last 168 hours.  Coagulation Profile: Recent Labs  Lab 09/11/23 0541  INR 1.2    Cardiac Enzymes: No results for input(s): "CKTOTAL", "CKMB", "CKMBINDEX", "TROPONINI" in the last 168 hours.  BNP (last 3 results) No results for input(s): "PROBNP" in the last 8760 hours.  Lipid Profile: No results for input(s): "CHOL", "HDL", "LDLCALC", "TRIG", "CHOLHDL", "LDLDIRECT" in the last 72 hours.  Thyroid Function Tests: No results for input(s): "TSH", "T4TOTAL", "FREET4", "T3FREE", "THYROIDAB" in the last 72 hours.  Anemia Panel: Recent Labs    09/15/23 0551  FERRITIN 117  TIBC 190*  IRON 42  RETICCTPCT 2.2    Urine analysis:    Component Value Date/Time   COLORURINE YELLOW 09/11/2023 0812   APPEARANCEUR HAZY (A) 09/11/2023 0812   LABSPEC 1.018 09/11/2023 0812   PHURINE 5.0 09/11/2023 0812   GLUCOSEU NEGATIVE 09/11/2023 0812   GLUCOSEU NEGATIVE 09/08/2010 1617   HGBUR NEGATIVE 09/11/2023 0812   BILIRUBINUR NEGATIVE 09/11/2023 0812   KETONESUR 20 (A) 09/11/2023 0812   PROTEINUR NEGATIVE 09/11/2023  0812   UROBILINOGEN 0.2 09/08/2010 1617   NITRITE NEGATIVE 09/11/2023 0812   LEUKOCYTESUR NEGATIVE 09/11/2023 0812    Sepsis Labs: Lactic Acid, Venous    Component Value Date/Time   LATICACIDVEN 1.0 09/12/2023 0511  MICROBIOLOGY: Recent Results (from the past 240 hours)  Blood Culture (routine x 2)     Status: None (Preliminary result)   Collection Time: 09/11/23  5:39 AM   Specimen: BLOOD  Result Value Ref Range Status   Specimen Description BLOOD BLOOD LEFT ARM  Final   Special Requests   Final    BOTTLES DRAWN AEROBIC AND ANAEROBIC Blood Culture results may not be optimal due to an inadequate volume of blood received in culture bottles   Culture   Final    NO GROWTH 4 DAYS Performed at [facility], [address], [place], [place] [address]    Report Status PENDING  Incomplete  Resp panel by RT-PCR (RSV, Flu A&B, Covid) Anterior Nasal Swab     Status: None   Collection Time: 09/11/23  5:41 AM   Specimen: Anterior Nasal Swab  Result Value Ref Range Status   SARS Coronavirus 2 by RT PCR NEGATIVE NEGATIVE Final   Influenza A by PCR NEGATIVE NEGATIVE Final   Influenza B by PCR NEGATIVE NEGATIVE Final    Comment: (NOTE) The Xpert Xpress SARS-CoV-2/FLU/RSV plus assay is intended as an aid in the diagnosis of influenza from Nasopharyngeal swab specimens and should not be used as a sole basis for treatment. Nasal washings and aspirates are unacceptable for Xpert Xpress SARS-CoV-2/FLU/RSV testing.  Fact Sheet for Patients: BloggerCourse.com  Fact Sheet for Healthcare Providers: SeriousBroker.it  This test is not yet approved or cleared by the [place] FDA and has been authorized for detection and/or diagnosis of SARS-CoV-2 by FDA under an Emergency Use Authorization (EUA). This EUA will remain in effect (meaning this test can be used) for the duration of the COVID-19 declaration under Section 564(b)(1) of the  Act, 21 U.S.C. section 360bbb-3(b)(1), unless the authorization is terminated or revoked.     Resp Syncytial Virus by PCR NEGATIVE NEGATIVE Final    Comment: (NOTE) Fact Sheet for Patients: BloggerCourse.com  Fact Sheet for Healthcare Providers: SeriousBroker.it  This test is not yet approved or cleared by the [place] FDA and has been authorized for detection and/or diagnosis of SARS-CoV-2 by FDA under an Emergency Use Authorization (EUA). This EUA will remain in effect (meaning this test can be used) for the duration of the COVID-19 declaration under Section 564(b)(1) of the Act, 21 U.S.C. section 360bbb-3(b)(1), unless the authorization is terminated or revoked.  Performed at [facility], [address], [place], [place] [address]   Blood Culture (routine x 2)     Status: None (Preliminary result)   Collection Time: 09/11/23  6:06 AM   Specimen: BLOOD  Result Value Ref Range Status   Specimen Description BLOOD BLOOD LEFT HAND  Final   Special Requests   Final    BOTTLES DRAWN AEROBIC AND ANAEROBIC Blood Culture adequate volume   Culture   Final    NO GROWTH 4 DAYS Performed at [facility], [address], [place], [place] [address]    Report Status PENDING  Incomplete  Expectorated Sputum Assessment w Gram Stain, Rflx to Resp Cult     Status: None   Collection Time: 09/11/23  9:48 AM   Specimen: Sputum  Result Value Ref Range Status   Specimen Description SPUTUM  Final   Special Requests Normal  Final   Sputum evaluation   Final    THIS SPECIMEN IS ACCEPTABLE FOR SPUTUM CULTURE Performed at [facility], [address], [place], [place] [address]  Report Status 09/13/2023 FINAL  Final  Culture, Respiratory w Gram Stain     Status: None (Preliminary result)   Collection Time: 09/11/23  9:48 AM   Specimen: SPU  Result Value Ref Range Status   Specimen Description SPUTUM  Final   Special  Requests Normal Reflexed from [id number]  Final   Gram Stain NO WBC SEEN RARE BUDDING YEAST SEEN   Final   Culture   Final    FEW KLEBSIELLA PNEUMONIAE CULTURE REINCUBATED FOR BETTER GROWTH SUSCEPTIBILITIES TO FOLLOW Performed at [facility], [address], [place], [place] [address]    Report Status PENDING  Incomplete  MRSA Next Gen by PCR, Nasal     Status: None   Collection Time: 09/11/23  5:39 PM   Specimen: Nasal Mucosa; Nasal Swab  Result Value Ref Range Status   MRSA by PCR Next Gen NOT DETECTED NOT DETECTED Final    Comment: (NOTE) The GeneXpert MRSA Assay (FDA approved for NASAL specimens only), is one component of a comprehensive MRSA colonization surveillance program. It is not intended to diagnose MRSA infection nor to guide or monitor treatment for MRSA infections. Test performance is not FDA approved in patients less than 94 years old. Performed at [facility], [address] [place], [place] [address]     RADIOLOGY STUDIES/RESULTS: DG Chest Port 1V same Day Result Date: 09/15/2023 CLINICAL DATA:  [id number] SOB (shortness of breath) [id number]. EXAM: PORTABLE CHEST 1 VIEW COMPARISON:  09/12/2023. FINDINGS: Low lung volume. Redemonstration of bilateral (left more than right) heterogeneous nonspecific opacities with relative sparing of the upper lung zones. No significant interval change. There is blunting of bilateral lateral costophrenic angles, which may represent underlying bilateral small pleural effusions. No pneumothorax. Stable cardio-mediastinal silhouette. No acute osseous abnormalities. The soft tissues are within normal limits. IMPRESSION: *No significant interval change in the bilateral heterogeneous opacities. Differential diagnosis includes multilobar pneumonia, ARDS, pulmonary edema, etc. Electronically Signed   By: Jules Schick M.D.   On: 09/15/2023 09:17   DG Swallowing Func-Speech Pathology Result Date: 09/14/2023 Table formatting from the  original result was not included. Modified Barium Swallow Study Study completed and documented by Rowe Robert, SLP Student Supervised and reviewed by Harlon Ditty MA CCC-SLP Patient Details Name: Shelly [name] MRN: [id number] Date of Birth: Sep 09, 1950 Today's Date: 09/14/2023 HPI/PMH: HPI: 17 yoF presenting from home with recurrent SOB and hypoxia.  Recently hospitalized 3/26- 4/2 for hypoxic respiratory failure felt related to rhinovirus, RLL strep PNA +/- aspiration PNA (given N/V), possible AECOPD.  Discharged home on 3-4L [place] with 3 days left on steroid taper. Felt initially better but with progressive SOB, found hypoxic in the 60's on HOT w/ EMS improved on HFNC.  No reported recent fevers, increased cough, productive at times, denies any dysphagia, and complains slightly of left calf pain.  Placed on BiPAP for increased WOB in ER, requiring 60%.  Afebrile, low to normotensive BP.  CXR showing multifocal infiltrates.  Since in ER awaiting bed, unable to tolerate BiPAP wean due to WOB and hypoxia. CT angiogram of the chest revealed concerns for scattered left-sided pulmonary emboli with mild clot burden, and changing parenchymal lung opacities with areas of tissue and septal thickening left lung worse than the right. Pt evalauted by SLP in initial admission, no finding of dysphagia. Clinical Impression: Clinical Impression: Pt presents with swallowing grossly WFL. Oral phase of swallow featured brisk bolus transit and complete clearance of bolus with no residue. Pt  displayed consistent delayed swallow initiation at level of pyriform sinuses, which resulted in a single event of flash penetration with thin-liquids during multiple sips; pt states that she typically takes single sips when she drinks. Chin-tuck appeared to be effective for providing additional support for airway safety prior to swallow initiation. Hyolaryngeal excursion was adequate with complete epiglottic inversion. Pharyngeal residue was only  trace. Tongue base retraction also appeared WFL. An esophageal sweep was completed with complete clearance of bolus and pill. Due to pt healing from oral lesions and lack of dentures during study (pt has dentures typically), only a small piece of solid was presented. Pt masticated solid completely and had no issues with the swallow. Recommend a dysphagia 3 (mech soft) and thin liquid diet. SLP will f/u to complete a diet check and to provide education about compensatory strategies to optimize pt's wellbeing. Factors that may increase risk of adverse event in presence of aspiration Rubye Oaks & Clearance Coots 2021): Factors that may increase risk of adverse event in presence of aspiration Rubye Oaks & Clearance Coots 2021): Respiratory or GI disease Recommendations/Plan: Swallowing Evaluation Recommendations Swallowing Evaluation Recommendations Recommendations: PO diet PO Diet Recommendation: Dysphagia 3 (Mechanical soft); Thin liquids (Level 0) Liquid Administration via: Cup; Straw Medication Administration: Whole meds with liquid Supervision: Patient able to self-feed Swallowing strategies  : Slow rate; Chin tuck Postural changes: Position pt fully upright for meals Oral care recommendations: Oral care BID (2x/day) Treatment Plan Treatment Plan Treatment recommendations: Therapy as outlined in treatment plan below Follow-up recommendations: Follow physicians's recommendations for discharge plan and follow up therapies Treatment frequency: Min 1x/week Treatment duration: 2 weeks Interventions: Diet toleration management by SLP; Patient/family education Recommendations Recommendations for follow up therapy are one component of a multi-disciplinary discharge planning process, led by the attending physician.  Recommendations may be updated based on patient status, additional functional criteria and insurance authorization. Assessment: Orofacial Exam: Orofacial Exam Oral Cavity - Dentition: Dentures, not available; Missing dentition  Anatomy: Anatomy: Prominent cricopharyngeus Boluses Administered: Boluses Administered Boluses Administered: Thin liquids (Level 0); Mildly thick liquids (Level 2, nectar thick); Puree; Solid  Oral Impairment Domain: Oral Impairment Domain Lip Closure: No labial escape Tongue control during bolus hold: Cohesive bolus between tongue to palatal seal Bolus preparation/mastication: Timely and efficient chewing and mashing Bolus transport/lingual motion: Brisk tongue motion Oral residue: Complete oral clearance Location of oral residue : N/A Initiation of pharyngeal swallow : Pyriform sinuses  Pharyngeal Impairment Domain: Pharyngeal Impairment Domain Soft palate elevation: No bolus between soft palate (SP)/pharyngeal wall (PW) Laryngeal elevation: Complete superior movement of thyroid cartilage with complete approximation of arytenoids to epiglottic petiole Anterior hyoid excursion: Complete anterior movement Epiglottic movement: Complete inversion Laryngeal vestibule closure: Incomplete, narrow column air/contrast in laryngeal vestibule Pharyngeal stripping wave : Present - complete Pharyngeal contraction (A/P view only): N/A Pharyngoesophageal segment opening: Complete distension and complete duration, no obstruction of flow Tongue base retraction: No contrast between tongue base and posterior pharyngeal wall (PPW) Pharyngeal residue: Trace residue within or on pharyngeal structures Location of pharyngeal residue: Valleculae  Esophageal Impairment Domain: Esophageal Impairment Domain Esophageal clearance upright position: Complete clearance, esophageal coating Pill: Pill Consistency administered: Thin liquids (Level 0) Thin liquids (Level 0): [facility] Penetration/Aspiration Scale Score: Penetration/Aspiration Scale Score 1.  Material does not enter airway: Mildly thick liquids (Level 2, nectar thick); Puree; Solid; Pill 2.  Material enters airway, remains ABOVE vocal cords then ejected out: Thin liquids (Level 0)  Compensatory Strategies: Compensatory Strategies Compensatory strategies: Yes Chin tuck: Effective  General Information: Caregiver present: No  No data recorded  Temperature : Normal   Respiratory Status: WFL   Supplemental O2: High flow nasal cannula   History of Recent Intubation: No  Behavior/Cognition: Alert; Cooperative; Pleasant mood Self-Feeding Abilities: Able to self-feed Baseline vocal quality/speech: Normal Volitional Cough: Able to elicit Volitional Swallow: Able to elicit Exam Limitations: No limitations Goal Planning: Prognosis for improved oropharyngeal function: Good No data recorded No data recorded No data recorded Consulted and agree with results and recommendations: Patient Pain: Pain Assessment Pain Assessment: No/denies pain Faces Pain Scale: 4 Pain Location: buttocks Pain Descriptors / Indicators: Sore Pain Intervention(s): Monitored during session; Repositioned; Other (comment) (pillows, upright position in chair with sacrum placed against back rest of chair.) End of Session: Start Time:SLP Start Time (ACUTE ONLY): 1210 Stop Time: SLP Stop Time (ACUTE ONLY): 1228 Time Calculation:SLP Time Calculation (min) (ACUTE ONLY): 18 min Charges: SLP Evaluations $ SLP Speech Visit: 1 Visit SLP Evaluations $MBS Swallow: 1 Procedure $Swallowing Treatment: 1 Procedure SLP visit diagnosis: No data recorded Past Medical History: Past Medical History: Diagnosis Date  Anxiety   Arthritis   Asthma   Avascular necrosis of [place] (HCC)   Cancer (HCC)   vulva pre cancer had surgery  Depression   Gait abnormality 03/20/2017  GERD (gastroesophageal reflux disease)   Hearing loss   "very minor"  History of pneumonia   Hyperlipidemia   Hypertension   Leg edema   left  Memory difficulty 03/20/2017  PONV (postoperative nausea and vomiting)   Stress incontinence  Past Surgical History: Past Surgical History: Procedure Laterality Date  ANKLE FUSION  2011  right multiple   ANKLE FUSION    rear ankle fusion  ANKLE SURGERY   2010  right cordicompression  AORTIC ARCH ANGIOGRAPHY N/A 12/11/2017  Procedure: AORTIC ARCH ANGIOGRAPHY;  Surgeon: [name], MD;  Location: [facility] INVASIVE CV LAB;  Service: Cardiovascular;  Laterality: N/A;  APPENDECTOMY  05/10/2016  BELOW KNEE LEG AMPUTATION    CATARACT EXTRACTION W/ INTRAOCULAR LENS IMPLANT Left   LAPAROSCOPIC APPENDECTOMY N/A 05/10/2016  Procedure: APPENDECTOMY LAPAROSCOPIC;  Surgeon: [name], MD;  Location: [facility] OR;  Service: General;  Laterality: N/A;  LUMBAR LAMINECTOMY/DECOMPRESSION MICRODISCECTOMY N/A 09/09/2015  Procedure:  L4-S1 Decompression/ Discetomy;  Surgeon: [name], MD;  Location: [facility] OR;  Service: Orthopedics;  Laterality: N/A;  SKIN GRAFT    SPINAL CORD STIMULATOR BATTERY EXCHANGE  06/23/2023  SPINAL CORD STIMULATOR INSERTION N/A 01/10/2019  Procedure: LUMBAR SPINAL CORD STIMULATOR INSERTION;  Surgeon: [name], MD;  Location: [facility];  Service: Orthopedics;  Laterality: N/A;  2.5 hrs  TONSILLECTOMY    TUBAL LIGATION  1983  VULVECTOMY N/A 01/28/2015  Procedure: WIDE EXCISION VULVECTOMY;  Surgeon: [name], MD;  Location: [facility];  Service: Gynecology;  Laterality: N/A; [place], Riley Nearing 09/14/2023, 2:30 PM    LOS: 4 days   Jeoffrey Massed, MD  [facility]    To contact the attending provider between 7A-7P or the covering provider during after hours 7P-7A, please log into the web site [email or web address] and access using universal [place] password for that web site. If you do not have the password, please call the hospital operator.  09/15/2023, 11:44 AM

## 2023-09-15 NOTE — Progress Notes (Signed)
 NAME:  Shelly [name], MRN:  [id number], DOB:  [birth year], LOS: 4 ADMISSION DATE:  09/11/2023, CONSULTATION DATE:  09/11/23 REFERRING MD:  Dr. Katrinka Blazing, CHIEF COMPLAINT:  SOB   History of Present Illness:   63 yoF with PMH of HTN, HLD, PAD, Right [place] artery stenosis, asthma, RLE avascular necrosis s/p BKA, alzheimers dementia, and former smoker presenting from home with recurrent SOB and hypoxia.  Recently hospitalized 3/26- 4/2 for hypoxic respiratory failure felt related to rhinovirus, RLL strep PNA +/- aspiration PNA (given N/V), possible AECOPD.  Discharged home on 3-4L [place] with 3 days left on steroid taper.    Felt initially better but with progressive SOB, found hypoxic in the 60's on HOT w/ EMS improved on HFNC.  No reported recent fevers, increased cough, productive at times, denies any dysphagia, and complains slightly of left calf pain.  Placed on BiPAP for increased WOB in ER, requiring 60%.  Afebrile, low to normotensive BP.  CXR showing multifocal infiltrates.  SARS/ flu/ RSV neg.  BNP 469, trop hs flat x3 in 20-30's.  PCT 0.13, WBC 18.2.  Empiric vancomycin and cefepime started.  TRH called for admit.  Lactic increased 1.4> 2.  CTA chest showed few scattered segmental PE on left, mild clot burden but changing parenchymal lung opacities of interstitial septal thickening GGO, more left sided with several enlarged mediastinal nodules.  Since in ER awaiting bed, unable to tolerate BiPAP wean due to WOB and hypoxia.  PCCM called for further evaluation.   Pertinent  Medical History  HTN, HLD, PAD, Right [place] artery stenosis, asthma, RLE avascular necrosis s/p BKA, alzheimers dementia, former smoker  Significant Hospital Events: Including procedures, antibiotic start and stop dates in addition to other pertinent events   4/7 Admit recurrent hypoxic respiratory failure, PE, GGO on bipap  Interim History / Subjective:   Back on heated high flow overnight, more short of breath today  morning  Objective   Blood pressure (!) 113/48, pulse 75, temperature 98.1 F (36.7 C), temperature source Oral, resp. rate (!) 26, height 5\' 5"  (1.651 m), weight 68.1 kg, SpO2 95%.    FiO2 (%):  [95 %] 95 %   Intake/Output Summary (Last 24 hours) at 09/15/2023 1112 Last data filed at 09/15/2023 0430 Gross per 24 hour  Intake --  Output 250 ml  Net -250 ml   Filed Weights   09/12/23 0424 09/13/23 0500 09/14/23 0500  Weight: 67.3 kg 67.3 kg 68.1 kg   Examination: Blood pressure 108/63, pulse 77, temperature 98.1 F (36.7 C), temperature source Oral, resp. rate 19, height 5\' 5"  (1.651 m), weight 68.1 kg, SpO2 97%. Gen:      No acute distress HEENT:  EOMI, sclera anicteric Neck:     No masses; no thyromegaly Lungs:    Bilateral crackles CV:         Regular rate and rhythm; no murmurs Abd:      + bowel sounds; soft, non-tender; no palpable masses, no distension Ext:    No edema; adequate peripheral perfusion Skin:      Warm and dry; no rash Neuro: alert and oriented x 3 Psych: normal mood and affect    Lab/imaging reviewed Labs are stable Chest x-ray shows stable bilateral airspace disease  Resolved Hospital Problem list    Assessment & Plan:  Recurrent hypoxic respiratory failure with increased bilateral GGO opacities, L> R - recent sed rate 68 on 4/1.  Patulous esophagus on chest CT with calcified node in subcarinal and left hilar c/w old granulomatous disease - recent tx rhinovirus, RLL strep pna (completed 4/2), and steroids (completed 4/5) - ddx include infectious vs inflammatory/ underlying lung process vs edema component.  Possible ongoing aspiration component as well.  Worsening leukocytosis but recently completed steroids, remains afebrile and PCT low.  PE not significant enough to be contributing to WOB/ hypoxia. She may have underlying interstitial lung disease.  CTD serologies are pending  Hx of COPD P:   Requiring higher levels of oxygen today. CXR is stable Cont zosyn Start Lasix for diuresis Increase steroid dose to Solu-Medrol 60 mg IV every 12 Follow-up autoimmune panel Pulm toiletry: Flutter/is PT/OT/OOB cont triple therapy nebs; prn duoneb cont mucinex  Concern for aspiration P: SLP following  Segmental left PE LLE dvt - L sided, mild clot burden, no evidence of heart strain on CT P: Continue eliquis  HFpEF HTN HLD Alzheimer's Macrocytic anemia P: -per primary  Best Practice (right click and "Reselect all SmartList Selections" daily)   Per primary  Signature:   Chilton Greathouse MD [facility] See Amion for pager  If no response to pager , please call [phone] until 7pm After 7:00 pm call Elink  [phone] 09/15/2023, 2:56 PM

## 2023-09-15 NOTE — Progress Notes (Signed)
 Speech Language Pathology Treatment: Dysphagia  Patient Details Name: Norissa Bartee MRN: [id number] DOB: 1951-01-18 Today's Date: 09/15/2023 Time: 1205-1227 SLP Time Calculation (min) (ACUTE ONLY): 22 min  Assessment / Plan / Recommendation Clinical Impression  Pt was seen for a diet check. Pt notes having a bad night with increased SOB, leading to increased O2 requirements. Pt is currently on 60L/m with HFNC. A friend of the pt was present for most of the session. SLP provided education about the impact of higher levels of O2 on swallowing and instructed pt on compensatory strategies to optimize airway safety (chin-tuck, effortful swallow, small bites/sips, intermittent cough and throat clear). Pt was observed with lunch to practice compensatory strategies. Pt exhibited no overt signs of aspiration. Pt was 95% consistent with utilizing compensatory strategies for maximizing swallow safety and efficiency when given min verbal cueing. Pt will need reinforcement of compensatory strategies, especially while she is requiring >40 L of O2.   SLP will f/u to monitor pt's swallow and respiratory status to determine need for further intervention. SLP will provide handout for pt to reinforce compensatory strategies to support pt's wellbeing and safety during swallowing.    HPI HPI: 51 yoF presenting from home with recurrent SOB and hypoxia.  Recently hospitalized 3/26- 4/2 for hypoxic respiratory failure felt related to rhinovirus, RLL strep PNA +/- aspiration PNA (given N/V), possible AECOPD.  Discharged home on 3-4L [place] with 3 days left on steroid taper. Felt initially better but with progressive SOB, found hypoxic in the 60's on HOT w/ EMS improved on HFNC.  No reported recent fevers, increased cough, productive at times, denies any dysphagia, and complains slightly of left calf pain.  Placed on BiPAP for increased WOB in ER, requiring 60%.  Afebrile, low to normotensive BP.  CXR showing multifocal  infiltrates.  Since in ER awaiting bed, unable to tolerate BiPAP wean due to WOB and hypoxia. CT angiogram of the chest revealed concerns for scattered left-sided pulmonary emboli with mild clot burden, and changing parenchymal lung opacities with areas of tissue and septal thickening left lung worse than the right. Pt evalauted by SLP in initial admission, no finding of dysphagia.      SLP Plan  Continue with current plan of care      Recommendations for follow up therapy are one component of a multi-disciplinary discharge planning process, led by the attending physician.  Recommendations may be updated based on patient status, additional functional criteria and insurance authorization.    Recommendations  Compensations: Slow rate;Small sips/bites;Clear throat intermittently;Other (Comment);Use straw to facilitate chin tuck;Chin tuck;Effortful swallow (Hard cough intermittently) Postural Changes and/or Swallow Maneuvers: Seated upright 90 degrees                  Oral care BID   Intermittent Supervision/Assistance Dysphagia, unspecified (R13.10)     Continue with current plan of care     Rowe Robert  09/15/2023, 12:47 PM

## 2023-09-15 NOTE — Progress Notes (Signed)
 RN called d/t pt desat at 82% on 15 lpm HFNC.  Per pt, she feels SOB and feels like she can't "get a deep breath"  states she feels "bad".  Initially added NRB over HFNC while HHFNC was being set up.   Sat slowly improved to 88-90%.  Pt placed on HHFNC 35 lpm, 95% fio2, sat now 90-93%.  Per pt, this has helped.  Pt states she is feeling somewhat better now.  Noted pt is having dry coughing bursts occasionally this morning.

## 2023-09-16 DIAGNOSIS — J9601 Acute respiratory failure with hypoxia: Secondary | ICD-10-CM | POA: Diagnosis not present

## 2023-09-16 DIAGNOSIS — I5189 Other ill-defined heart diseases: Secondary | ICD-10-CM | POA: Diagnosis not present

## 2023-09-16 DIAGNOSIS — I2609 Other pulmonary embolism with acute cor pulmonale: Secondary | ICD-10-CM | POA: Diagnosis not present

## 2023-09-16 DIAGNOSIS — J189 Pneumonia, unspecified organism: Secondary | ICD-10-CM | POA: Diagnosis not present

## 2023-09-16 LAB — CULTURE, BLOOD (ROUTINE X 2)
Culture: NO GROWTH
Culture: NO GROWTH
Special Requests: ADEQUATE

## 2023-09-16 LAB — BASIC METABOLIC PANEL WITH GFR
Anion gap: 12 (ref 5–15)
BUN: 16 mg/dL (ref 8–23)
CO2: 31 mmol/L (ref 22–32)
Calcium: 8.7 mg/dL — ABNORMAL LOW (ref 8.9–10.3)
Chloride: 95 mmol/L — ABNORMAL LOW (ref 98–111)
Creatinine, Ser: 0.62 mg/dL (ref 0.44–1.00)
GFR, Estimated: >60 mL/min (ref >60)
Glucose, Bld: 122 mg/dL — ABNORMAL HIGH (ref 70–99)
Potassium: 3.6 mmol/L (ref 3.5–5.1)
Sodium: 138 mmol/L (ref 135–145)

## 2023-09-16 LAB — CBC
HCT: 29.4 % — ABNORMAL LOW (ref 36.0–46.0)
Hemoglobin: 9.4 g/dL — ABNORMAL LOW (ref 12.0–15.0)
MCH: 30.6 pg (ref 26.0–34.0)
MCHC: 32 g/dL (ref 30.0–36.0)
MCV: 95.8 fL (ref 80.0–100.0)
Platelets: 356 10*3/uL (ref 150–400)
RBC: 3.07 MIL/uL — ABNORMAL LOW (ref 3.87–5.11)
RDW: 13.4 % (ref 11.5–15.5)
WBC: 11.9 10*3/uL — ABNORMAL HIGH (ref 4.0–10.5)
nRBC: 0 % (ref 0.0–0.2)

## 2023-09-16 LAB — CULTURE, RESPIRATORY W GRAM STAIN
Gram Stain: NONE SEEN
Special Requests: NORMAL

## 2023-09-16 LAB — C-REACTIVE PROTEIN: CRP: 5.5 mg/dL — ABNORMAL HIGH (ref <1.0)

## 2023-09-16 MED ORDER — PIPERACILLIN-TAZOBACTAM 3.375 G IVPB
3.3750 g | Freq: Three times a day (TID) | INTRAVENOUS | Status: AC
  Administered 2023-09-16 – 2023-09-19 (×9): 3.375 g via INTRAVENOUS
  Filled 2023-09-16 (×9): qty 50

## 2023-09-16 MED ORDER — FUROSEMIDE 10 MG/ML IJ SOLN
40.0000 mg | Freq: Every day | INTRAMUSCULAR | Status: DC
  Administered 2023-09-16: 40 mg via INTRAVENOUS
  Filled 2023-09-16: qty 4

## 2023-09-16 MED ORDER — MAGIC MOUTHWASH W/LIDOCAINE
10.0000 mL | Freq: Four times a day (QID) | ORAL | Status: DC | PRN
  Administered 2023-09-16 – 2023-09-18 (×4): 10 mL via ORAL
  Filled 2023-09-16 (×7): qty 10

## 2023-09-16 MED ORDER — MIDODRINE HCL 5 MG PO TABS
10.0000 mg | ORAL_TABLET | Freq: Once | ORAL | Status: AC
  Administered 2023-09-16: 10 mg via ORAL
  Filled 2023-09-16: qty 2

## 2023-09-16 MED ORDER — SODIUM CHLORIDE 0.9 % IV BOLUS
500.0000 mL | Freq: Once | INTRAVENOUS | Status: AC
  Administered 2023-09-16: 500 mL via INTRAVENOUS

## 2023-09-16 NOTE — Progress Notes (Signed)
 NAME:  Shelly [name], MRN:  [id number], DOB:  1950/07/19, LOS: 5 ADMISSION DATE:  09/11/2023, CONSULTATION DATE:  09/11/23 REFERRING MD:  Dr. Felipe Horton, CHIEF COMPLAINT:  SOB   History of Present Illness:   75 yoF with PMH of HTN, HLD, PAD, Right [place] artery stenosis, asthma, RLE avascular necrosis s/p BKA, alzheimers dementia, and former smoker presenting from home with recurrent SOB and hypoxia.  Recently hospitalized 3/26- 4/2 for hypoxic respiratory failure felt related to rhinovirus, RLL strep PNA +/- aspiration PNA (given N/V), possible AECOPD.  Discharged home on 3-4L [place] with 3 days left on steroid taper.    Felt initially better but with progressive SOB, found hypoxic in the 60's on HOT w/ EMS improved on HFNC.  No reported recent fevers, increased cough, productive at times, denies any dysphagia, and complains slightly of left calf pain.  Placed on BiPAP for increased WOB in ER, requiring 60%.  Afebrile, low to normotensive BP.  CXR showing multifocal infiltrates.  SARS/ flu/ RSV neg.  BNP 469, trop hs flat x3 in 20-30's.  PCT 0.13, WBC 18.2.  Empiric vancomycin and cefepime started.  TRH called for admit.  Lactic increased 1.4> 2.  CTA chest showed few scattered segmental PE on left, mild clot burden but changing parenchymal lung opacities of interstitial septal thickening GGO, more left sided with several enlarged mediastinal nodules.  Since in ER awaiting bed, unable to tolerate BiPAP wean due to WOB and hypoxia.  PCCM called for further evaluation.   Pertinent  Medical History  HTN, HLD, PAD, Right [place] artery stenosis, asthma, RLE avascular necrosis s/p BKA, alzheimers dementia, former smoker  Significant Hospital Events: Including procedures, antibiotic start and stop dates in addition to other pertinent events   4/7 Admit recurrent hypoxic respiratory failure, PE, GGO on bipap 4/11 Back on heated high flow overnight, more short of breath today morning 4/12 H HFNC remains at 70% FiO2 35 L,  patient reports improvement in dyspnea this a.m.  Interim History / Subjective:  Seen sitting up in bedside recliner with reports of improvement in dyspnea  Objective   Blood pressure (!) 82/50, pulse 77, temperature (!) 97.5 F (36.4 C), temperature source Oral, resp. rate 20, height 5\' 5"  (1.651 m), weight 68.1 kg, SpO2 96%.    FiO2 (%):  [70 %-90 %] 70 %   Intake/Output Summary (Last 24 hours) at 09/16/2023 1204 Last data filed at 09/16/2023 1140 Gross per 24 hour  Intake --  Output 1350 ml  Net -1350 ml   Filed Weights   09/12/23 0424 09/13/23 0500 09/14/23 0500  Weight: 67.3 kg 67.3 kg 68.1 kg   Examination: General: Acute on chronic ill-appearing deconditioned elderly female sitting up in bedside recliner no acute distress HEENT: [place]/AT, MM pink/moist, PERRL,  Neuro: Alert and oriented x 3, nonfocal, weak CV: s1s2 regular rate and rhythm, no murmur, rubs, or gallops,  PULM: Slightly diminished air entry bilaterally, no wheezing, no rhonchi, on 70% FiO2 35 L HHFNC GI: soft, bowel sounds active in all 4 quadrants, non-tender, non-distended, tolerating oral diet Extremities: warm/dry, no edema  Skin: no rashes or lesions     Resolved Hospital Problem list    Assessment & Plan:  Recurrent hypoxic respiratory failure with increased bilateral GGO opacities, L> R - recent sed rate 68 on 4/1.  Patulous esophagus on chest CT with calcified node in subcarinal and left hilar c/w old granulomatous disease - recent tx rhinovirus, RLL strep pna (completed 4/2), and steroids (  completed 4/5) - ddx include infectious vs inflammatory/ underlying lung process vs edema component.  Possible ongoing aspiration component as well.  Worsening leukocytosis but recently completed steroids, remains afebrile and PCT low.  PE not significant enough to be contributing to WOB/ hypoxia. She may have underlying interstitial lung disease.  CTD serologies are pending Hx of COPD P:  Continue HHFNC, wean as  able Continue to diurese as able Remains on IV Solu-Medrol 60 mg every 12 Encourage pulmonary hygiene Mobilize as able Continue triple neb therapy and as needed DuoNebs Mucinex  Concern for aspiration P: SLP following, appreciate assistance  Segmental left PE LLE dvt - L sided, mild clot burden, no evidence of heart strain on CT P: Continue Eliquis  HFpEF HTN HLD Alzheimer's Macrocytic anemia P: -per primary  Best Practice (right click and "Reselect all SmartList Selections" daily)   Per primary  Signature:  Chanette Demo D. Harris, NP-C [facility] Personal contact information can be found on Amion  If no contact or response made please call [phone] 09/16/2023, 1:11 PM

## 2023-09-16 NOTE — Progress Notes (Signed)
 Notified Dr. Hilton Lucky of Pts. Hypotension.

## 2023-09-16 NOTE — Progress Notes (Signed)
 Mobility Specialist: Progress Note   09/16/23 1129  Mobility  Activity Stood at bedside  Level of Assistance Contact guard assist, steadying assist  Assistive Device Front wheel walker  Activity Response Tolerated poorly  Mobility Referral Yes  Mobility visit 1 Mobility  Mobility Specialist Start Time (ACUTE ONLY) 1017  Mobility Specialist Stop Time (ACUTE ONLY) 1034  Mobility Specialist Time Calculation (min) (ACUTE ONLY) 17 min    Pt was agreeable to mobility session with little encouragement - received in chair. C/o being fatigued. Unable to participate much this session, performed STS from chair but couldn't complete any marching exercises before needing to sit down. Started having a coughing spell causing SpO2 desat to 82-83% on 30LO2. Further mobility deferred. Waited till SpO2 maintained 90% 30LO2 before leaving. Left in chair with all needs met, call bell in reach.   Deloria Fetch Mobility Specialist Please contact via SecureChat or Rehab office at [phone]

## 2023-09-16 NOTE — Progress Notes (Signed)
 PROGRESS NOTE        PATIENT DETAILS Name: Shelly [name] Age: 73 y.o. Sex: female Date of Birth: [birth year] Admit Date: 09/11/2023 Admitting Physician Josiah Nigh, MD NWG:NFAOZ, Corbett Desanctis, MD  Brief Summary: Patient is a 73 y.o.  female with history of dementia, right BKA, HTN, HLD, COPD-who was hospitalized from 3/26-4/2 for acute hypoxic respiratory failure secondary to rhinovirus/strep pneumonia infection-patient was stabilized-discharged on home oxygen-presented to the ED on 4/7 with worsening shortness of breath-found to be severely hypoxemic-CTA chest showed a small PE-she was started on heated high flow and subsequently admitted by the hospitalist service but due to worsening hypoxemia-she was transferred to the ICU for close monitoring.  She was stabilized and transferred back to [facility] on 4/10.  Significant events: 3/26-4/2>> hospitalization for hypoxia-rhinovirus/strep pneumo antigen positive-discharged on home O2 4/7>> worsening shortness of breath-worsening hypoxemia-admit to [facility] but later transferred to ICU 4/10>> transferred to [facility] 4/11>> O2 saturation in the 80s on 15 L of HFNC-CXR unchanged-switched to heated high flow-IV Lasix started-prednisone switched to Solu-Medrol.  Significant studies: 4/7>> CTA chest: Few scattered segmental PE medial left upper lobe/middle lobe-changing parenchymal lung opacities with areas of interstitial septal thickening and groundglass opacities. 4/8>> echo: EF 65-70% 4/8>> B/L lower extremity Doppler: Age indeterminate DVT left peroneal vein  Significant microbiology data: 4/7>> COVID/influenza/RSV PCR: Negative 4/7>> sputum culture: Pending 4/7>> blood culture: No growth  Procedures: None  Consults: PCCM  Subjective: Feels somewhat better today-remains on heated high flow.  Appears comfortable.  Objective: Vitals: Blood pressure 130/64, pulse 76, temperature 98.1 F (36.7 C), temperature source Oral,  resp. rate (!) 23, height 5\' 5"  (1.651 m), weight 68.1 kg, SpO2 94%.   Exam: Gen Exam:Alert awake-not in any distress HEENT:atraumatic, normocephalic Chest: B/L clear to auscultation anteriorly CVS:S1S2 regular Abdomen:soft non tender, non distended Extremities:no edema Neurology: Non focal Skin: no rash  Pertinent Labs/Radiology:    Latest Ref Rng & Units 09/16/2023    6:02 AM 09/15/2023    5:51 AM 09/14/2023    4:40 AM  CBC  WBC 4.0 - 10.5 K/uL 11.9  11.1  11.9   Hemoglobin 12.0 - 15.0 g/dL 9.4  9.2  8.6   Hematocrit 36.0 - 46.0 % 29.4  28.9  26.9   Platelets 150 - 400 K/uL 356  330  338     Lab Results  Component Value Date   NA 138 09/16/2023   K 3.6 09/16/2023   CL 95 (L) 09/16/2023   CO2 31 09/16/2023     Assessment/Plan: Acute hypoxic respiratory failure secondary to recent rhinovirus/strep pneumoniae-possible interstitial lung disease related to recent pneumonia-and now small PE Remains on heated high flow  Switched to IV Solu-Medrol on 4/11-given worsening hypoxemia Continue IV Lasix to keep negative balance-although no overt signs of volume overload.   Remains on empiric Zosyn-x 7 days total planned Continue bronchodilators/anticoagulation Mobilize with PT/OT Encourage use of incentive spirometry/flutter valve. Note-Per SLP input-no obvious signs of aspiration-remains on dysphagia 3 diet.  May require repeat ambulatory O2 sat-possible that may require more oxygen than discharged on recently.  Segmental left-sided pulmonary embolism with left peroneal vein DVT Continue Eliquis  Normocytic anemia Likely due to critical illness No evidence of blood loss Folate/vitamin B12 level stable. Follow CBC  Chronic HFpEF Euvolemic Starting IV diuretics to ensure negative balance given worsening hypoxemia.  COPD  Not in exacerbation Continue bronchodilators  HLD Statin  HTN BP stable-losartan remains on hold  Mood disorder Stable Continue  Wellbutrin/Prozac  Dementia Delirium precautions Aricept  History of right BKA-uses prosthesis  Code status:   Code Status: Full Code   DVT Prophylaxis: apixaban (ELIQUIS) tablet 10 mg  apixaban (ELIQUIS) tablet 5 mg    Family Communication: Daughter-Marissa-[phone]-updated 4/11  Disposition Plan: Status is: Inpatient Remains inpatient appropriate because: Severity of illness   Planned Discharge Destination:Home  The patient is critically ill with multiple organ system failure and requires high complexity decision making for assessment and support, frequent evaluation and titration of therapies, advanced monitoring, review of radiographic studies and interpretation of complex data.   Total Critical time spent equals 45 minutes    Diet: Diet Order             DIET DYS 3 Room service appropriate? Yes with Assist; Fluid consistency: Thin  Diet effective now                     Antimicrobial agents: Anti-infectives (From admission, onward)    Start     Dose/Rate Route Frequency Ordered Stop   09/11/23 2200  vancomycin (VANCOREADY) IVPB 750 mg/150 mL  Status:  Discontinued        750 mg 150 mL/hr over 60 Minutes Intravenous Every 12 hours 09/11/23 0926 09/12/23 0958   09/11/23 2200  piperacillin-tazobactam (ZOSYN) IVPB 3.375 g       Placed in "Followed by" Linked Group   3.375 g 12.5 mL/hr over 240 Minutes Intravenous Every 8 hours 09/11/23 1549     09/11/23 1600  azithromycin (ZITHROMAX) 500 mg in sodium chloride 0.9 % 250 mL IVPB        500 mg 250 mL/hr over 60 Minutes Intravenous Every 24 hours 09/11/23 1547 09/14/23 0857   09/11/23 1600  piperacillin-tazobactam (ZOSYN) IVPB 3.375 g       Placed in "Followed by" Linked Group   3.375 g 100 mL/hr over 30 Minutes Intravenous  Once 09/11/23 1549 09/11/23 2000   09/11/23 1200  ceFEPIme (MAXIPIME) 2 g in sodium chloride 0.9 % 100 mL IVPB  Status:  Discontinued        2 g 200 mL/hr over 30 Minutes  Intravenous Every 8 hours 09/11/23 0926 09/11/23 1549   09/11/23 0615  vancomycin (VANCOCIN) IVPB 1000 mg/200 mL premix  Status:  Discontinued        1,000 mg 200 mL/hr over 60 Minutes Intravenous  Once 09/11/23 0603 09/11/23 0605   09/11/23 0615  ceFEPIme (MAXIPIME) 2 g in sodium chloride 0.9 % 100 mL IVPB        2 g 200 mL/hr over 30 Minutes Intravenous  Once 09/11/23 0603 09/11/23 0706   09/11/23 0615  vancomycin (VANCOREADY) IVPB 1500 mg/300 mL        1,500 mg 150 mL/hr over 120 Minutes Intravenous  Once 09/11/23 0605 09/11/23 0920        MEDICATIONS: Scheduled Meds:  apixaban  10 mg Oral BID   Followed by   Cecily Cohen ON 09/20/2023] apixaban  5 mg Oral BID   arformoterol  15 mcg Nebulization BID   budesonide (PULMICORT) nebulizer solution  0.5 mg Nebulization BID   buPROPion  300 mg Oral Daily   Chlorhexidine Gluconate Cloth  6 each Topical Q0600   donepezil  10 mg Oral QHS   FLUoxetine  20 mg Oral Daily   furosemide  40 mg Intravenous Daily  guaiFENesin  600 mg Oral BID   melatonin  3 mg Oral QHS   methylPREDNISolone (SOLU-MEDROL) injection  60 mg Intravenous Q12H   montelukast  10 mg Oral QHS   pantoprazole  40 mg Oral Daily   revefenacin  175 mcg Nebulization Daily   rosuvastatin  10 mg Oral QHS   sodium chloride flush  3 mL Intravenous Q12H   Continuous Infusions:  piperacillin-tazobactam (ZOSYN)  IV 3.375 g (09/16/23 0843)   PRN Meds:.acetaminophen **OR** acetaminophen, benzocaine, ipratropium-albuterol, ondansetron **OR** ondansetron (ZOFRAN) IV   I have personally reviewed following labs and imaging studies  LABORATORY DATA: CBC: Recent Labs  Lab 09/11/23 0541 09/11/23 0550 09/12/23 0511 09/13/23 0437 09/14/23 0440 09/15/23 0551 09/16/23 0602  WBC 18.2*  --  11.0* 11.5* 11.9* 11.1* 11.9*  NEUTROABS 16.0*  --   --   --   --   --   --   HGB 10.7*   < > 8.9* 8.5* 8.6* 9.2* 9.4*  HCT 34.5*   < > 27.2* 26.1* 26.9* 28.9* 29.4*  MCV 100.3*  --  95.8 95.6  97.5 97.3 95.8  PLT 297  --  281 321 338 330 356   < > = values in this interval not displayed.    Basic Metabolic Panel: Recent Labs  Lab 09/12/23 0511 09/13/23 0437 09/14/23 0440 09/15/23 0551 09/16/23 0602  NA 137 137 139 139 138  K 3.7 3.8 3.6 3.9 3.6  CL 94* 98 100 98 95*  CO2 27 30 32 30 31  GLUCOSE 133* 165* 110* 78 122*  BUN 16 17 20 19 16   CREATININE 0.77 0.58 0.62 0.64 0.62  CALCIUM 8.5* 8.4* 8.7* 8.7* 8.7*    GFR: Estimated Creatinine Clearance: 55.5 mL/min (by C-G formula based on SCr of 0.62 mg/dL).  Liver Function Tests: Recent Labs  Lab 09/11/23 0541  AST 29  ALT 35  ALKPHOS 71  BILITOT 0.9  PROT 6.0*  ALBUMIN 2.4*   No results for input(s): "LIPASE", "AMYLASE" in the last 168 hours. No results for input(s): "AMMONIA" in the last 168 hours.  Coagulation Profile: Recent Labs  Lab 09/11/23 0541  INR 1.2    Cardiac Enzymes: No results for input(s): "CKTOTAL", "CKMB", "CKMBINDEX", "TROPONINI" in the last 168 hours.  BNP (last 3 results) No results for input(s): "PROBNP" in the last 8760 hours.  Lipid Profile: No results for input(s): "CHOL", "HDL", "LDLCALC", "TRIG", "CHOLHDL", "LDLDIRECT" in the last 72 hours.  Thyroid Function Tests: No results for input(s): "TSH", "T4TOTAL", "FREET4", "T3FREE", "THYROIDAB" in the last 72 hours.  Anemia Panel: Recent Labs    09/15/23 0551  FERRITIN 117  TIBC 190*  IRON 42  RETICCTPCT 2.2    Urine analysis:    Component Value Date/Time   COLORURINE YELLOW 09/11/2023 0812   APPEARANCEUR HAZY (A) 09/11/2023 0812   LABSPEC 1.018 09/11/2023 0812   PHURINE 5.0 09/11/2023 0812   GLUCOSEU NEGATIVE 09/11/2023 0812   GLUCOSEU NEGATIVE 09/08/2010 1617   HGBUR NEGATIVE 09/11/2023 0812   BILIRUBINUR NEGATIVE 09/11/2023 0812   KETONESUR 20 (A) 09/11/2023 0812   PROTEINUR NEGATIVE 09/11/2023 0812   UROBILINOGEN 0.2 09/08/2010 1617   NITRITE NEGATIVE 09/11/2023 0812   LEUKOCYTESUR NEGATIVE 09/11/2023  0812    Sepsis Labs: Lactic Acid, Venous    Component Value Date/Time   LATICACIDVEN 1.0 09/12/2023 0511    MICROBIOLOGY: Recent Results (from the past 240 hours)  Blood Culture (routine x 2)     Status: None  Collection Time: 09/11/23  5:39 AM   Specimen: BLOOD  Result Value Ref Range Status   Specimen Description BLOOD BLOOD LEFT ARM  Final   Special Requests   Final    BOTTLES DRAWN AEROBIC AND ANAEROBIC Blood Culture results may not be optimal due to an inadequate volume of blood received in culture bottles   Culture   Final    NO GROWTH 5 DAYS Performed at [facility], [address] [place], [place] [address]    Report Status 09/16/2023 FINAL  Final  Resp panel by RT-PCR (RSV, Flu A&B, Covid) Anterior Nasal Swab     Status: None   Collection Time: 09/11/23  5:41 AM   Specimen: Anterior Nasal Swab  Result Value Ref Range Status   SARS Coronavirus 2 by RT PCR NEGATIVE NEGATIVE Final   Influenza A by PCR NEGATIVE NEGATIVE Final   Influenza B by PCR NEGATIVE NEGATIVE Final    Comment: (NOTE) The Xpert Xpress SARS-CoV-2/FLU/RSV plus assay is intended as an aid in the diagnosis of influenza from Nasopharyngeal swab specimens and should not be used as a sole basis for treatment. Nasal washings and aspirates are unacceptable for Xpert Xpress SARS-CoV-2/FLU/RSV testing.  Fact Sheet for Patients: BloggerCourse.com  Fact Sheet for Healthcare Providers: SeriousBroker.it  This test is not yet approved or cleared by the [place]  FDA and has been authorized for detection and/or diagnosis of SARS-CoV-2 by FDA under an Emergency Use Authorization (EUA). This EUA will remain in effect (meaning this test can be used) for the duration of the COVID-19 declaration under Section 564(b)(1) of the Act, 21 U.S.C. section 360bbb-3(b)(1), unless the authorization is terminated or revoked.     Resp Syncytial Virus by PCR  NEGATIVE NEGATIVE Final    Comment: (NOTE) Fact Sheet for Patients: BloggerCourse.com  Fact Sheet for Healthcare Providers: SeriousBroker.it  This test is not yet approved or cleared by the [place]  FDA and has been authorized for detection and/or diagnosis of SARS-CoV-2 by FDA under an Emergency Use Authorization (EUA). This EUA will remain in effect (meaning this test can be used) for the duration of the COVID-19 declaration under Section 564(b)(1) of the Act, 21 U.S.C. section 360bbb-3(b)(1), unless the authorization is terminated or revoked.  Performed at [facility], [address] [place], [place] [address]   Blood Culture (routine x 2)     Status: None   Collection Time: 09/11/23  6:06 AM   Specimen: BLOOD  Result Value Ref Range Status   Specimen Description BLOOD BLOOD LEFT HAND  Final   Special Requests   Final    BOTTLES DRAWN AEROBIC AND ANAEROBIC Blood Culture adequate volume   Culture   Final    NO GROWTH 5 DAYS Performed at [facility], [address] [place], [place] [address]    Report Status 09/16/2023 FINAL  Final  Expectorated Sputum Assessment w Gram Stain, Rflx to Resp Cult     Status: None   Collection Time: 09/11/23  9:48 AM   Specimen: Sputum  Result Value Ref Range Status   Specimen Description SPUTUM  Final   Special Requests Normal  Final   Sputum evaluation   Final    THIS SPECIMEN IS ACCEPTABLE FOR SPUTUM CULTURE Performed at [facility], [address], [place], [place] [address]    Report Status 09/13/2023 FINAL  Final  Culture, Respiratory w Gram Stain     Status: None   Collection Time:  09/11/23  9:48 AM   Specimen: SPU  Result Value Ref Range Status   Specimen Description SPUTUM  Final   Special Requests Normal Reflexed from [id number]  Final   Gram Stain   Final    NO WBC SEEN RARE BUDDING YEAST SEEN Performed at [facility], [address]  [place], [place] [address]    Culture   Final    FEW KLEBSIELLA PNEUMONIAE ABUNDANT CANDIDA ALBICANS    Report Status 09/16/2023 FINAL  Final   Organism ID, Bacteria KLEBSIELLA PNEUMONIAE  Final      Susceptibility   Klebsiella pneumoniae - MIC*    AMPICILLIN RESISTANT Resistant     CEFEPIME <=0.12 SENSITIVE Sensitive     CEFTAZIDIME <=1 SENSITIVE Sensitive     CEFTRIAXONE <=0.25 SENSITIVE Sensitive     CIPROFLOXACIN <=0.25 SENSITIVE Sensitive     GENTAMICIN <=1 SENSITIVE Sensitive     IMIPENEM <=0.25 SENSITIVE Sensitive     TRIMETH/SULFA <=20 SENSITIVE Sensitive     AMPICILLIN/SULBACTAM 4 SENSITIVE Sensitive     PIP/TAZO <=4 SENSITIVE Sensitive ug/mL    * FEW KLEBSIELLA PNEUMONIAE  MRSA Next Gen by PCR, Nasal     Status: None   Collection Time: 09/11/23  5:39 PM   Specimen: Nasal Mucosa; Nasal Swab  Result Value Ref Range Status   MRSA by PCR Next Gen NOT DETECTED NOT DETECTED Final    Comment: (NOTE) The GeneXpert MRSA Assay (FDA approved for NASAL specimens only), is one component of a comprehensive MRSA colonization surveillance program. It is not intended to diagnose MRSA infection nor to guide or monitor treatment for MRSA infections. Test performance is not FDA approved in patients less than 15 years old. Performed at [facility], [address] [place], [place] [address]     RADIOLOGY STUDIES/RESULTS: DG Chest Port 1V same Day Result Date: 09/15/2023 CLINICAL DATA:  [id number] SOB (shortness of breath) [id number]. EXAM: PORTABLE CHEST 1 VIEW COMPARISON:  09/12/2023. FINDINGS: Low lung volume. Redemonstration of bilateral (left more than right) heterogeneous nonspecific opacities with relative sparing of the upper lung zones. No significant interval change. There is blunting of bilateral lateral costophrenic angles, which may represent underlying bilateral small pleural effusions. No pneumothorax. Stable cardio-mediastinal silhouette. No acute osseous abnormalities. The soft  tissues are within normal limits. IMPRESSION: *No significant interval change in the bilateral heterogeneous opacities. Differential diagnosis includes multilobar pneumonia, ARDS, pulmonary edema, etc. Electronically Signed   By: Beula Brunswick M.D.   On: 09/15/2023 09:17   DG Swallowing Func-Speech Pathology Result Date: 09/14/2023 Table formatting from the original result was not included. Modified Barium Swallow Study Study completed and documented by Aurelia Leeks, SLP Student Supervised and reviewed by Noble Bateman MA CCC-SLP Patient Details Name: Shelly [name] MRN: [id number] Date of Birth: Aug 20, 1950 Today's Date: 09/14/2023 HPI/PMH: HPI: 83 yoF presenting from home with recurrent SOB and hypoxia.  Recently hospitalized 3/26- 4/2 for hypoxic respiratory failure felt related to rhinovirus, RLL strep PNA +/- aspiration PNA (given N/V), possible AECOPD.  Discharged home on 3-4L [place] with 3 days left on steroid taper. Felt initially better but with progressive SOB, found hypoxic in the 60's on HOT w/ EMS improved on HFNC.  No reported recent fevers, increased cough, productive at times, denies any dysphagia, and complains slightly of left calf pain.  Placed on BiPAP for increased WOB in ER, requiring 60%.  Afebrile, low to normotensive BP.  CXR showing multifocal infiltrates.  Since in ER awaiting  bed, unable to tolerate BiPAP wean due to WOB and hypoxia. CT angiogram of the chest revealed concerns for scattered left-sided pulmonary emboli with mild clot burden, and changing parenchymal lung opacities with areas of tissue and septal thickening left lung worse than the right. Pt evalauted by SLP in initial admission, no finding of dysphagia. Clinical Impression: Clinical Impression: Pt presents with swallowing grossly WFL. Oral phase of swallow featured brisk bolus transit and complete clearance of bolus with no residue. Pt displayed consistent delayed swallow initiation at level of pyriform sinuses, which  resulted in a single event of flash penetration with thin-liquids during multiple sips; pt states that she typically takes single sips when she drinks. Chin-tuck appeared to be effective for providing additional support for airway safety prior to swallow initiation. Hyolaryngeal excursion was adequate with complete epiglottic inversion. Pharyngeal residue was only trace. Tongue base retraction also appeared WFL. An esophageal sweep was completed with complete clearance of bolus and pill. Due to pt healing from oral lesions and lack of dentures during study (pt has dentures typically), only a small piece of solid was presented. Pt masticated solid completely and had no issues with the swallow. Recommend a dysphagia 3 (mech soft) and thin liquid diet. SLP will f/u to complete a diet check and to provide education about compensatory strategies to optimize pt's wellbeing. Factors that may increase risk of adverse event in presence of aspiration Roderick Civatte & Jessy Morocco 2021): Factors that may increase risk of adverse event in presence of aspiration Roderick Civatte & Jessy Morocco 2021): Respiratory or GI disease Recommendations/Plan: Swallowing Evaluation Recommendations Swallowing Evaluation Recommendations Recommendations: PO diet PO Diet Recommendation: Dysphagia 3 (Mechanical soft); Thin liquids (Level 0) Liquid Administration via: Cup; Straw Medication Administration: Whole meds with liquid Supervision: Patient able to self-feed Swallowing strategies  : Slow rate; Chin tuck Postural changes: Position pt fully upright for meals Oral care recommendations: Oral care BID (2x/day) Treatment Plan Treatment Plan Treatment recommendations: Therapy as outlined in treatment plan below Follow-up recommendations: Follow physicians's recommendations for discharge plan and follow up therapies Treatment frequency: Min 1x/week Treatment duration: 2 weeks Interventions: Diet toleration management by SLP; Patient/family education Recommendations  Recommendations for follow up therapy are one component of a multi-disciplinary discharge planning process, led by the attending physician.  Recommendations may be updated based on patient status, additional functional criteria and insurance authorization. Assessment: Orofacial Exam: Orofacial Exam Oral Cavity - Dentition: Dentures, not available; Missing dentition Anatomy: Anatomy: Prominent cricopharyngeus Boluses Administered: Boluses Administered Boluses Administered: Thin liquids (Level 0); Mildly thick liquids (Level 2, nectar thick); Puree; Solid  Oral Impairment Domain: Oral Impairment Domain Lip Closure: No labial escape Tongue control during bolus hold: Cohesive bolus between tongue to palatal seal Bolus preparation/mastication: Timely and efficient chewing and mashing Bolus transport/lingual motion: Brisk tongue motion Oral residue: Complete oral clearance Location of oral residue : N/A Initiation of pharyngeal swallow : Pyriform sinuses  Pharyngeal Impairment Domain: Pharyngeal Impairment Domain Soft palate elevation: No bolus between soft palate (SP)/pharyngeal wall (PW) Laryngeal elevation: Complete superior movement of thyroid cartilage with complete approximation of arytenoids to epiglottic petiole Anterior hyoid excursion: Complete anterior movement Epiglottic movement: Complete inversion Laryngeal vestibule closure: Incomplete, narrow column air/contrast in laryngeal vestibule Pharyngeal stripping wave : Present - complete Pharyngeal contraction (A/P view only): N/A Pharyngoesophageal segment opening: Complete distension and complete duration, no obstruction of flow Tongue base retraction: No contrast between tongue base and posterior pharyngeal wall (PPW) Pharyngeal residue: Trace residue within or on pharyngeal structures Location of  pharyngeal residue: Valleculae  Esophageal Impairment Domain: Esophageal Impairment Domain Esophageal clearance upright position: Complete clearance, esophageal  coating Pill: Pill Consistency administered: Thin liquids (Level 0) Thin liquids (Level 0): [facility] Penetration/Aspiration Scale Score: Penetration/Aspiration Scale Score 1.  Material does not enter airway: Mildly thick liquids (Level 2, nectar thick); Puree; Solid; Pill 2.  Material enters airway, remains ABOVE vocal cords then ejected out: Thin liquids (Level 0) Compensatory Strategies: Compensatory Strategies Compensatory strategies: Yes Chin tuck: Effective   General Information: Caregiver present: No  No data recorded  Temperature : Normal   Respiratory Status: WFL   Supplemental O2: High flow nasal cannula   History of Recent Intubation: No  Behavior/Cognition: Alert; Cooperative; Pleasant mood Self-Feeding Abilities: Able to self-feed Baseline vocal quality/speech: Normal Volitional Cough: Able to elicit Volitional Swallow: Able to elicit Exam Limitations: No limitations Goal Planning: Prognosis for improved oropharyngeal function: Good No data recorded No data recorded No data recorded Consulted and agree with results and recommendations: Patient Pain: Pain Assessment Pain Assessment: No/denies pain Faces Pain Scale: 4 Pain Location: buttocks Pain Descriptors / Indicators: Sore Pain Intervention(s): Monitored during session; Repositioned; Other (comment) (pillows, upright position in chair with sacrum placed against back rest of chair.) End of Session: Start Time:SLP Start Time (ACUTE ONLY): 1210 Stop Time: SLP Stop Time (ACUTE ONLY): 1228 Time Calculation:SLP Time Calculation (min) (ACUTE ONLY): 18 min Charges: SLP Evaluations $ SLP Speech Visit: 1 Visit SLP Evaluations $MBS Swallow: 1 Procedure $Swallowing Treatment: 1 Procedure SLP visit diagnosis: No data recorded Past Medical History: Past Medical History: Diagnosis Date  Anxiety   Arthritis   Asthma   Avascular necrosis of [place] (HCC)   Cancer (HCC)   vulva pre cancer had surgery  Depression   Gait abnormality 03/20/2017  GERD (gastroesophageal reflux  disease)   Hearing loss   "very minor"  History of pneumonia   Hyperlipidemia   Hypertension   Leg edema   left  Memory difficulty 03/20/2017  PONV (postoperative nausea and vomiting)   Stress incontinence  Past Surgical History: Past Surgical History: Procedure Laterality Date  ANKLE FUSION  2011  right multiple   ANKLE FUSION    rear ankle fusion  ANKLE SURGERY  2010  right cordicompression  AORTIC ARCH ANGIOGRAPHY N/A 12/11/2017  Procedure: AORTIC ARCH ANGIOGRAPHY;  Surgeon: Richrd Char, MD;  Location: [facility];  Service: Cardiovascular;  Laterality: N/A;  APPENDECTOMY  05/10/2016  BELOW KNEE LEG AMPUTATION    CATARACT EXTRACTION W/ INTRAOCULAR LENS IMPLANT Left   LAPAROSCOPIC APPENDECTOMY N/A 05/10/2016  Procedure: APPENDECTOMY LAPAROSCOPIC;  Surgeon: Oza Blumenthal, MD;  Location: [facility] OR;  Service: General;  Laterality: N/A;  LUMBAR LAMINECTOMY/DECOMPRESSION MICRODISCECTOMY N/A 09/09/2015  Procedure:  L4-S1 Decompression/ Discetomy;  Surgeon: Mort Ards, MD;  Location: [facility];  Service: Orthopedics;  Laterality: N/A;  SKIN GRAFT    SPINAL CORD STIMULATOR BATTERY EXCHANGE  06/23/2023  SPINAL CORD STIMULATOR INSERTION N/A 01/10/2019  Procedure: LUMBAR SPINAL CORD STIMULATOR INSERTION;  Surgeon: Mort Ards, MD;  Location: [facility] OR;  Service: Orthopedics;  Laterality: N/A;  2.5 hrs  TONSILLECTOMY    TUBAL LIGATION  1983  VULVECTOMY N/A 01/28/2015  Procedure: WIDE EXCISION VULVECTOMY;  Surgeon: Johnn Najjar, MD;  Location: [facility];  Service: Gynecology;  Laterality: N/A; DeBlois, Hardin Leys 09/14/2023, 2:30 PM    LOS: 5 days   Kimberly Penna, MD  [facility]    To contact the attending provider between 7A-7P or the covering provider during after hours 7P-7A,  please log into the web site [email or web address] and access using universal [place] password for that web site. If you do not have the password, please call the hospital operator.  09/16/2023, 11:22 AM

## 2023-09-17 ENCOUNTER — Inpatient Hospital Stay (HOSPITAL_COMMUNITY)

## 2023-09-17 DIAGNOSIS — I5189 Other ill-defined heart diseases: Secondary | ICD-10-CM | POA: Diagnosis not present

## 2023-09-17 DIAGNOSIS — J9601 Acute respiratory failure with hypoxia: Secondary | ICD-10-CM | POA: Diagnosis not present

## 2023-09-17 DIAGNOSIS — J189 Pneumonia, unspecified organism: Secondary | ICD-10-CM | POA: Diagnosis not present

## 2023-09-17 DIAGNOSIS — I2699 Other pulmonary embolism without acute cor pulmonale: Secondary | ICD-10-CM | POA: Diagnosis not present

## 2023-09-17 DIAGNOSIS — I2609 Other pulmonary embolism with acute cor pulmonale: Secondary | ICD-10-CM | POA: Diagnosis not present

## 2023-09-17 LAB — CBC
HCT: 28.9 % — ABNORMAL LOW (ref 36.0–46.0)
Hemoglobin: 9.4 g/dL — ABNORMAL LOW (ref 12.0–15.0)
MCH: 31.1 pg (ref 26.0–34.0)
MCHC: 32.5 g/dL (ref 30.0–36.0)
MCV: 95.7 fL (ref 80.0–100.0)
Platelets: 380 10*3/uL (ref 150–400)
RBC: 3.02 MIL/uL — ABNORMAL LOW (ref 3.87–5.11)
RDW: 13.5 % (ref 11.5–15.5)
WBC: 14.5 10*3/uL — ABNORMAL HIGH (ref 4.0–10.5)
nRBC: 0 % (ref 0.0–0.2)

## 2023-09-17 LAB — BASIC METABOLIC PANEL WITH GFR
Anion gap: 9 (ref 5–15)
BUN: 16 mg/dL (ref 8–23)
CO2: 33 mmol/L — ABNORMAL HIGH (ref 22–32)
Calcium: 8.6 mg/dL — ABNORMAL LOW (ref 8.9–10.3)
Chloride: 95 mmol/L — ABNORMAL LOW (ref 98–111)
Creatinine, Ser: 0.6 mg/dL (ref 0.44–1.00)
GFR, Estimated: >60 mL/min (ref >60)
Glucose, Bld: 130 mg/dL — ABNORMAL HIGH (ref 70–99)
Potassium: 3.6 mmol/L (ref 3.5–5.1)
Sodium: 137 mmol/L (ref 135–145)

## 2023-09-17 LAB — BRAIN NATRIURETIC PEPTIDE: B Natriuretic Peptide: 175 pg/mL — ABNORMAL HIGH (ref 0.0–100.0)

## 2023-09-17 LAB — C-REACTIVE PROTEIN: CRP: 2.2 mg/dL — ABNORMAL HIGH (ref <1.0)

## 2023-09-17 NOTE — Progress Notes (Signed)
 NAME:  Shelly [name], MRN:  [id number], DOB:  May 08, 1951, LOS: 6 ADMISSION DATE:  09/11/2023, CONSULTATION DATE:  09/11/23 REFERRING MD:  Dr. Felipe Horton, CHIEF COMPLAINT:  SOB   History of Present Illness:   16 yoF with PMH of HTN, HLD, PAD, Right [place] artery stenosis, asthma, RLE avascular necrosis s/p BKA, alzheimers dementia, and former smoker presenting from home with recurrent SOB and hypoxia.  Recently hospitalized 3/26- 4/2 for hypoxic respiratory failure felt related to rhinovirus, RLL strep PNA +/- aspiration PNA (given N/V), possible AECOPD.  Discharged home on 3-4L [place] with 3 days left on steroid taper.    Felt initially better but with progressive SOB, found hypoxic in the 60's on HOT w/ EMS improved on HFNC.  No reported recent fevers, increased cough, productive at times, denies any dysphagia, and complains slightly of left calf pain.  Placed on BiPAP for increased WOB in ER, requiring 60%.  Afebrile, low to normotensive BP.  CXR showing multifocal infiltrates.  SARS/ flu/ RSV neg.  BNP 469, trop hs flat x3 in 20-30's.  PCT 0.13, WBC 18.2.  Empiric vancomycin and cefepime started.  [facility] called for admit.  Lactic increased 1.4> 2.  CTA chest showed few scattered segmental PE on left, mild clot burden but changing parenchymal lung opacities of interstitial septal thickening GGO, more left sided with several enlarged mediastinal nodules.  Since in ER awaiting bed, unable to tolerate BiPAP wean due to WOB and hypoxia.  PCCM called for further evaluation.   Pertinent  Medical History  HTN, HLD, PAD, Right [place] artery stenosis, asthma, RLE avascular necrosis s/p BKA, alzheimers dementia, former smoker  Significant Hospital Events: Including procedures, antibiotic start and stop dates in addition to other pertinent events   4/7 Admit recurrent hypoxic respiratory failure, PE, GGO on bipap 4/11 Back on heated high flow overnight, more short of breath today morning 4/12 H HFNC remains at 70% FiO2 35 L,  patient reports improvement in dyspnea this a.m.  Interim History / Subjective:  Diuresed yesterday with hypotension. Primary team holding further lasix Remains stable on FIO2 65-70% 35L Ambulatory O2 with PT requiring 15 O2 Objective   Blood pressure (!) 103/51, pulse 80, temperature 98.6 F (37 C), temperature source Oral, resp. rate 20, height 5\' 5"  (1.651 m), weight 71.5 kg, SpO2 96%.    FiO2 (%):  [65 %] 65 %  No intake or output data in the 24 hours ending 09/17/23 1411  Filed Weights   09/13/23 0500 09/14/23 0500 09/17/23 0545  Weight: 67.3 kg 68.1 kg 71.5 kg   Physical Exam: General: Chronically ill-appearing, no acute distress HENT: [place], AT, OP clear, MMM Eyes: EOMI, no scleral icterus Respiratory: Diminished to auscultation bilaterally.  No crackles, wheezing or rales Cardiovascular: RRR, -M/R/G, no JVD Extremities:-Edema,-tenderness Neuro: AAO x4, CNII-XII grossly intact  BMET acceptable CRP improved to 2.2 WBC increased to 14.5 Hg stable     Resolved Hospital Problem list    Assessment & Plan:  Recurrent hypoxic respiratory failure with increased bilateral GGO opacities, L> R - recent sed rate 68 on 4/1. Improving Patulous esophagus on chest CT with calcified node in subcarinal and left hilar c/w old granulomatous disease - recent tx rhinovirus, RLL strep pna (completed 4/2), and steroids (completed 4/5) - ddx include infectious vs inflammatory/ underlying lung process vs edema component.  Possible ongoing aspiration component as well.  Worsening leukocytosis but recently completed steroids, remains afebrile and PCT low.  PE not significant enough to be  contributing to WOB/ hypoxia. She may have underlying interstitial lung disease.  CTD serologies are pending Hx of COPD P:  Wean HHFNC for goal >88% Continue to diurese as able Remains on Solumedrol 60 mg BID Complete 7 days of Zosyn Agree holding on diuresis Encourage pulmonary hygiene Mobilize as  above Continue triple nebs therapy and as needed DuoNebs Mucinex  Concern for aspiration P: SLP following, appreciate assistance  Segmental left PE LLE dvt - L sided, mild clot burden, no evidence of heart strain on CT P: Continue Eliquis  HFpEF HTN HLD Alzheimer's Macrocytic anemia P: -per primary  Best Practice (right click and "Reselect all SmartList Selections" daily)   Per primary  Signature:   Care Time: 35 min  Genetta Kenning, M.D. [facility] 09/17/2023 2:11 PM   See Amion for personal pager For hours between 7 PM to 7 AM, please call Elink for urgent questions

## 2023-09-17 NOTE — Plan of Care (Signed)
 progressing

## 2023-09-17 NOTE — Progress Notes (Signed)
 PROGRESS NOTE        PATIENT DETAILS Name: Shelly [name] Age: 73 y.o. Sex: female Date of Birth: 10-28-1950 Admit Date: 09/11/2023 Admitting Physician Josiah Nigh, MD ZOX:[id number], Corbett Desanctis, MD  Brief Summary: Patient is a 73 y.o.  female with history of dementia, right BKA, HTN, HLD, COPD-who was hospitalized from 3/26-4/2 for acute hypoxic respiratory failure secondary to rhinovirus/strep pneumonia infection-patient was stabilized-discharged on home oxygen-presented to the ED on 4/7 with worsening shortness of breath-found to be severely hypoxemic-CTA chest showed a small PE-she was started on heated high flow and subsequently admitted by the hospitalist service but due to worsening hypoxemia-she was transferred to the ICU for close monitoring.  She was stabilized and transferred back to [facility] on 4/10.  Significant events: 3/26-4/2>> hospitalization for hypoxia-rhinovirus/strep pneumo antigen positive-discharged on home O2 4/7>> worsening shortness of breath-worsening hypoxemia-admit to [facility] but later transferred to ICU 4/10>> transferred to [facility] 4/11>> O2 saturation in the 80s on 15 L of HFNC-CXR unchanged-switched to heated high flow-IV Lasix started-prednisone switched to Solu-Medrol.  Significant studies: 4/7>> CTA chest: Few scattered segmental PE medial left upper lobe/middle lobe-changing parenchymal lung opacities with areas of interstitial septal thickening and groundglass opacities. 4/8>> echo: EF 65-70% 4/8>> B/L lower extremity Doppler: Age indeterminate DVT left peroneal vein  Significant microbiology data: 4/7>> COVID/influenza/RSV PCR: Negative 4/7>> sputum culture: Pending 4/7>> blood culture: No growth  Procedures: None  Consults: PCCM  Subjective: Essentially unchanged-remains on heated high flow.  No other complaints.  Objective: Vitals: Blood pressure 116/60, pulse 88, temperature 97.7 F (36.5 C), temperature source Oral, resp.  rate (!) 26, height 5\' 5"  (1.651 m), weight 71.5 kg, SpO2 92%.   Exam: Gen Exam:Alert awake-not in any distress HEENT:atraumatic, normocephalic Chest: B/L clear to auscultation anteriorly CVS:S1S2 regular Abdomen:soft non tender, non distended Extremities:no edema Neurology: Non focal Skin: no rash  Pertinent Labs/Radiology:    Latest Ref Rng & Units 09/17/2023    7:28 AM 09/16/2023    6:02 AM 09/15/2023    5:51 AM  CBC  WBC 4.0 - 10.5 K/uL 14.5  11.9  11.1   Hemoglobin 12.0 - 15.0 g/dL 9.4  9.4  9.2   Hematocrit 36.0 - 46.0 % 28.9  29.4  28.9   Platelets 150 - 400 K/uL 380  356  330     Lab Results  Component Value Date   NA 137 09/17/2023   K 3.6 09/17/2023   CL 95 (L) 09/17/2023   CO2 33 (H) 09/17/2023     Assessment/Plan: Acute hypoxic respiratory failure secondary to recent rhinovirus/strep pneumoniae-possible interstitial lung disease related to recent pneumonia-and now small PE Unchanged overnight-remains on heated high flow Switched to IV Solu-Medrol on 4/11-given worsening hypoxemia Developed hypotension on 4/12 after IV Lasix-required IV fluid bolus-and is no longer on Lasix-volume status is stable-will dose Lasix as needed. Remains on empiric Zosyn-x 7 days total planned Continue bronchodilators/anticoagulation Mobilize with PT/OT Encourage use of incentive spirometry/flutter valve. Note-Per SLP input-no obvious signs of aspiration-remains on dysphagia 3 diet.  May require repeat ambulatory O2 sat-possible that may require more oxygen than discharged on recently.  Segmental left-sided pulmonary embolism with left peroneal vein DVT Continue Eliquis  Normocytic anemia Likely due to critical illness No evidence of blood loss Folate/vitamin B12 level stable. Follow CBC  Chronic HFpEF Euvolemic IV Lasix as needed to  maintain negative balance  COPD Not in exacerbation Continue bronchodilators  HLD Statin  HTN BP stable-losartan remains on  hold  Mood disorder Stable Continue Wellbutrin/Prozac  Dementia Delirium precautions Aricept  History of right BKA-uses prosthesis  Code status:   Code Status: Full Code   DVT Prophylaxis: apixaban (ELIQUIS) tablet 10 mg  apixaban (ELIQUIS) tablet 5 mg    Family Communication: Daughter-[phone]-updated 4/11  Disposition Plan: Status is: Inpatient Remains inpatient appropriate because: Severity of illness   Planned Discharge Destination:Home   Diet: Diet Order             DIET DYS 3 Room service appropriate? Yes with Assist; Fluid consistency: Thin  Diet effective now                     Antimicrobial agents: Anti-infectives (From admission, onward)    Start     Dose/Rate Route Frequency Ordered Stop   09/16/23 1600  piperacillin-tazobactam (ZOSYN) IVPB 3.375 g       Placed in "Followed by" Linked Group   3.375 g 12.5 mL/hr over 240 Minutes Intravenous Every 8 hours 09/16/23 1144     09/11/23 2200  vancomycin (VANCOREADY) IVPB 750 mg/150 mL  Status:  Discontinued        750 mg 150 mL/hr over 60 Minutes Intravenous Every 12 hours 09/11/23 0926 09/12/23 0958   09/11/23 2200  piperacillin-tazobactam (ZOSYN) IVPB 3.375 g  Status:  Discontinued       Placed in "Followed by" Linked Group   3.375 g 12.5 mL/hr over 240 Minutes Intravenous Every 8 hours 09/11/23 1549 09/16/23 1144   09/11/23 1600  azithromycin (ZITHROMAX) 500 mg in sodium chloride 0.9 % 250 mL IVPB        500 mg 250 mL/hr over 60 Minutes Intravenous Every 24 hours 09/11/23 1547 09/14/23 0857   09/11/23 1600  piperacillin-tazobactam (ZOSYN) IVPB 3.375 g       Placed in "Followed by" Linked Group   3.375 g 100 mL/hr over 30 Minutes Intravenous  Once 09/11/23 1549 09/11/23 2000   09/11/23 1200  ceFEPIme (MAXIPIME) 2 g in sodium chloride 0.9 % 100 mL IVPB  Status:  Discontinued        2 g 200 mL/hr over 30 Minutes Intravenous Every 8 hours 09/11/23 0926 09/11/23 1549   09/11/23 0615   vancomycin (VANCOCIN) IVPB 1000 mg/200 mL premix  Status:  Discontinued        1,000 mg 200 mL/hr over 60 Minutes Intravenous  Once 09/11/23 0603 09/11/23 0605   09/11/23 0615  ceFEPIme (MAXIPIME) 2 g in sodium chloride 0.9 % 100 mL IVPB        2 g 200 mL/hr over 30 Minutes Intravenous  Once 09/11/23 0603 09/11/23 0706   09/11/23 0615  vancomycin (VANCOREADY) IVPB 1500 mg/300 mL        1,500 mg 150 mL/hr over 120 Minutes Intravenous  Once 09/11/23 0605 09/11/23 0920        MEDICATIONS: Scheduled Meds:  apixaban  10 mg Oral BID   Followed by   Cecily Cohen ON 09/20/2023] apixaban  5 mg Oral BID   arformoterol  15 mcg Nebulization BID   budesonide (PULMICORT) nebulizer solution  0.5 mg Nebulization BID   buPROPion  300 mg Oral Daily   Chlorhexidine Gluconate Cloth  6 each Topical Q0600   donepezil  10 mg Oral QHS   FLUoxetine  20 mg Oral Daily   guaiFENesin  600 mg Oral BID  melatonin  3 mg Oral QHS   methylPREDNISolone (SOLU-MEDROL) injection  60 mg Intravenous Q12H   montelukast  10 mg Oral QHS   pantoprazole  40 mg Oral Daily   revefenacin  175 mcg Nebulization Daily   rosuvastatin  10 mg Oral QHS   sodium chloride flush  3 mL Intravenous Q12H   Continuous Infusions:  piperacillin-tazobactam (ZOSYN)  IV 3.375 g (09/17/23 0852)   PRN Meds:.acetaminophen **OR** acetaminophen, benzocaine, ipratropium-albuterol, magic mouthwash w/lidocaine, ondansetron **OR** ondansetron (ZOFRAN) IV   I have personally reviewed following labs and imaging studies  LABORATORY DATA: CBC: Recent Labs  Lab 09/11/23 0541 09/11/23 0550 09/13/23 0437 09/14/23 0440 09/15/23 0551 09/16/23 0602 09/17/23 0728  WBC 18.2*   < > 11.5* 11.9* 11.1* 11.9* 14.5*  NEUTROABS 16.0*  --   --   --   --   --   --   HGB 10.7*   < > 8.5* 8.6* 9.2* 9.4* 9.4*  HCT 34.5*   < > 26.1* 26.9* 28.9* 29.4* 28.9*  MCV 100.3*   < > 95.6 97.5 97.3 95.8 95.7  PLT 297   < > 321 338 330 356 380   < > = values in this  interval not displayed.    Basic Metabolic Panel: Recent Labs  Lab 09/13/23 0437 09/14/23 0440 09/15/23 0551 09/16/23 0602 09/17/23 0728  NA 137 139 139 138 137  K 3.8 3.6 3.9 3.6 3.6  CL 98 100 98 95* 95*  CO2 30 32 30 31 33*  GLUCOSE 165* 110* 78 122* 130*  BUN 17 20 19 16 16   CREATININE 0.58 0.62 0.64 0.62 0.60  CALCIUM 8.4* 8.7* 8.7* 8.7* 8.6*    GFR: Estimated Creatinine Clearance: 61.2 mL/min (by C-G formula based on SCr of 0.6 mg/dL).  Liver Function Tests: Recent Labs  Lab 09/11/23 0541  AST 29  ALT 35  ALKPHOS 71  BILITOT 0.9  PROT 6.0*  ALBUMIN 2.4*   No results for input(s): "LIPASE", "AMYLASE" in the last 168 hours. No results for input(s): "AMMONIA" in the last 168 hours.  Coagulation Profile: Recent Labs  Lab 09/11/23 0541  INR 1.2    Cardiac Enzymes: No results for input(s): "CKTOTAL", "CKMB", "CKMBINDEX", "TROPONINI" in the last 168 hours.  BNP (last 3 results) No results for input(s): "PROBNP" in the last 8760 hours.  Lipid Profile: No results for input(s): "CHOL", "HDL", "LDLCALC", "TRIG", "CHOLHDL", "LDLDIRECT" in the last 72 hours.  Thyroid Function Tests: No results for input(s): "TSH", "T4TOTAL", "FREET4", "T3FREE", "THYROIDAB" in the last 72 hours.  Anemia Panel: Recent Labs    09/15/23 0551  FERRITIN 117  TIBC 190*  IRON 42  RETICCTPCT 2.2    Urine analysis:    Component Value Date/Time   COLORURINE YELLOW 09/11/2023 0812   APPEARANCEUR HAZY (A) 09/11/2023 0812   LABSPEC 1.018 09/11/2023 0812   PHURINE 5.0 09/11/2023 0812   GLUCOSEU NEGATIVE 09/11/2023 0812   GLUCOSEU NEGATIVE 09/08/2010 1617   HGBUR NEGATIVE 09/11/2023 0812   BILIRUBINUR NEGATIVE 09/11/2023 0812   KETONESUR 20 (A) 09/11/2023 0812   PROTEINUR NEGATIVE 09/11/2023 0812   UROBILINOGEN 0.2 09/08/2010 1617   NITRITE NEGATIVE 09/11/2023 0812   LEUKOCYTESUR NEGATIVE 09/11/2023 0812    Sepsis Labs: Lactic Acid, Venous    Component Value Date/Time    LATICACIDVEN 1.0 09/12/2023 0511    MICROBIOLOGY: Recent Results (from the past 240 hours)  Blood Culture (routine x 2)     Status: None   Collection Time:  09/11/23  5:39 AM   Specimen: BLOOD  Result Value Ref Range Status   Specimen Description BLOOD BLOOD LEFT ARM  Final   Special Requests   Final    BOTTLES DRAWN AEROBIC AND ANAEROBIC Blood Culture results may not be optimal due to an inadequate volume of blood received in culture bottles   Culture   Final    NO GROWTH 5 DAYS Performed at [facility], [address], [place], [place] [address]    Report Status 09/16/2023 FINAL  Final  Resp panel by RT-PCR (RSV, Flu A&B, Covid) Anterior Nasal Swab     Status: None   Collection Time: 09/11/23  5:41 AM   Specimen: Anterior Nasal Swab  Result Value Ref Range Status   SARS Coronavirus 2 by RT PCR NEGATIVE NEGATIVE Final   Influenza A by PCR NEGATIVE NEGATIVE Final   Influenza B by PCR NEGATIVE NEGATIVE Final    Comment: (NOTE) The Xpert Xpress SARS-CoV-2/FLU/RSV plus assay is intended as an aid in the diagnosis of influenza from Nasopharyngeal swab specimens and should not be used as a sole basis for treatment. Nasal washings and aspirates are unacceptable for Xpert Xpress SARS-CoV-2/FLU/RSV testing.  Fact Sheet for Patients: BloggerCourse.com  Fact Sheet for Healthcare Providers: SeriousBroker.it  This test is not yet approved or cleared by the [place]  FDA and has been authorized for detection and/or diagnosis of SARS-CoV-2 by FDA under an Emergency Use Authorization (EUA). This EUA will remain in effect (meaning this test can be used) for the duration of the COVID-19 declaration under Section 564(b)(1) of the Act, 21 U.S.C. section 360bbb-3(b)(1), unless the authorization is terminated or revoked.     Resp Syncytial Virus by PCR NEGATIVE NEGATIVE Final    Comment: (NOTE) Fact Sheet for  Patients: BloggerCourse.com  Fact Sheet for Healthcare Providers: SeriousBroker.it  This test is not yet approved or cleared by the [place]  FDA and has been authorized for detection and/or diagnosis of SARS-CoV-2 by FDA under an Emergency Use Authorization (EUA). This EUA will remain in effect (meaning this test can be used) for the duration of the COVID-19 declaration under Section 564(b)(1) of the Act, 21 U.S.C. section 360bbb-3(b)(1), unless the authorization is terminated or revoked.  Performed at [facility], [address] [place], [place] [address]   Blood Culture (routine x 2)     Status: None   Collection Time: 09/11/23  6:06 AM   Specimen: BLOOD  Result Value Ref Range Status   Specimen Description BLOOD BLOOD LEFT HAND  Final   Special Requests   Final    BOTTLES DRAWN AEROBIC AND ANAEROBIC Blood Culture adequate volume   Culture   Final    NO GROWTH 5 DAYS Performed at [facility], [address], [place], [place] [address]    Report Status 09/16/2023 FINAL  Final  Expectorated Sputum Assessment w Gram Stain, Rflx to Resp Cult     Status: None   Collection Time: 09/11/23  9:48 AM   Specimen: Sputum  Result Value Ref Range Status   Specimen Description SPUTUM  Final   Special Requests Normal  Final   Sputum evaluation   Final    THIS SPECIMEN IS ACCEPTABLE FOR SPUTUM CULTURE Performed at [facility], [address] [place], [place] [address]    Report Status 09/13/2023 FINAL  Final  Culture, Respiratory w Gram Stain     Status: None   Collection Time: 09/11/23  9:48 AM   Specimen: SPU  Result Value Ref Range Status   Specimen Description SPUTUM  Final   Special Requests Normal Reflexed from [id number]  Final   Gram Stain   Final    NO WBC SEEN RARE BUDDING YEAST SEEN Performed at [facility], [address] [place], [place] [address]    Culture   Final    FEW KLEBSIELLA  PNEUMONIAE ABUNDANT CANDIDA ALBICANS    Report Status 09/16/2023 FINAL  Final   Organism ID, Bacteria KLEBSIELLA PNEUMONIAE  Final      Susceptibility   Klebsiella pneumoniae - MIC*    AMPICILLIN RESISTANT Resistant     CEFEPIME <=0.12 SENSITIVE Sensitive     CEFTAZIDIME <=1 SENSITIVE Sensitive     CEFTRIAXONE <=0.25 SENSITIVE Sensitive     CIPROFLOXACIN <=0.25 SENSITIVE Sensitive     GENTAMICIN <=1 SENSITIVE Sensitive     IMIPENEM <=0.25 SENSITIVE Sensitive     TRIMETH/SULFA <=20 SENSITIVE Sensitive     AMPICILLIN/SULBACTAM 4 SENSITIVE Sensitive     PIP/TAZO <=4 SENSITIVE Sensitive ug/mL    * FEW KLEBSIELLA PNEUMONIAE  MRSA Next Gen by PCR, Nasal     Status: None   Collection Time: 09/11/23  5:39 PM   Specimen: Nasal Mucosa; Nasal Swab  Result Value Ref Range Status   MRSA by PCR Next Gen NOT DETECTED NOT DETECTED Final    Comment: (NOTE) The GeneXpert MRSA Assay (FDA approved for NASAL specimens only), is one component of a comprehensive MRSA colonization surveillance program. It is not intended to diagnose MRSA infection nor to guide or monitor treatment for MRSA infections. Test performance is not FDA approved in patients less than 80 years old. Performed at [facility], [address] [place], [place] [address]     RADIOLOGY STUDIES/RESULTS: DG Chest Port 1 View Result Date: 09/17/2023 CLINICAL DATA:  Dyspnea EXAM: PORTABLE CHEST 1 VIEW COMPARISON:  September 15, 2023 FINDINGS: Low lung volumes. Similar distribution of the patchy airspace opacities throughout the lungs. No pneumothorax. Blunting of both costophrenic sulci, possible left and small pleural effusions. Unchanged cardiomegaly. Tortuous aorta with aortic atherosclerosis. Multilevel thoracic osteophytosis. Spinal stimulator device leads again noted in the mid thorax. IMPRESSION: No significant interval change to the airspace disease throughout the lungs. Electronically Signed   By: Rance Burrows M.D.   On:  09/17/2023 08:46     LOS: 6 days   Kimberly Penna, MD  [facility]    To contact the attending provider between 7A-7P or the covering provider during after hours 7P-7A, please log into the web site [email or web address] and access using universal [place] password for that web site. If you do not have the password, please call the hospital operator.  09/17/2023, 9:59 AM

## 2023-09-17 NOTE — Progress Notes (Signed)
 Mobility Specialist: Progress Note   09/17/23 1254  Mobility  Activity Stood at bedside  Level of Assistance Standby assist, set-up cues, supervision of patient - no hands on  Assistive Device Front wheel walker  Range of Motion/Exercises Right leg;Left leg  Activity Response Tolerated well  Mobility Referral Yes  Mobility visit 1 Mobility  Mobility Specialist Start Time (ACUTE ONLY) 1015  Mobility Specialist Stop Time (ACUTE ONLY) 1032  Mobility Specialist Time Calculation (min) (ACUTE ONLY) 17 min    Pt was eager and agreeable to mobility session - received in chair. SV for STS, Completed 2 sets of STS paired with 20 marches with a seated break in between each set. Desat 2x to SpO2 81-83% 15LO2 HHFNC but recovered with seated break and cues for PLB. Left in chair with all needs met, call bell in reach.   Deloria Fetch Mobility Specialist Please contact via SecureChat or Rehab office at [phone]

## 2023-09-17 NOTE — Plan of Care (Signed)
  Problem: Clinical Measurements: Goal: Will remain free from infection Outcome: Progressing Goal: Diagnostic test results will improve Outcome: Progressing Goal: Respiratory complications will improve Outcome: Progressing Goal: Cardiovascular complication will be avoided Outcome: Progressing   Problem: Activity: Goal: Risk for activity intolerance will decrease Outcome: Progressing   Problem: Nutrition: Goal: Adequate nutrition will be maintained Outcome: Progressing   Problem: Safety: Goal: Ability to remain free from injury will improve Outcome: Progressing

## 2023-09-18 ENCOUNTER — Encounter: Admitting: Physical Therapy

## 2023-09-18 DIAGNOSIS — J189 Pneumonia, unspecified organism: Secondary | ICD-10-CM | POA: Diagnosis not present

## 2023-09-18 DIAGNOSIS — I2699 Other pulmonary embolism without acute cor pulmonale: Secondary | ICD-10-CM | POA: Diagnosis not present

## 2023-09-18 DIAGNOSIS — J9601 Acute respiratory failure with hypoxia: Secondary | ICD-10-CM | POA: Diagnosis not present

## 2023-09-18 DIAGNOSIS — I2609 Other pulmonary embolism with acute cor pulmonale: Secondary | ICD-10-CM | POA: Diagnosis not present

## 2023-09-18 DIAGNOSIS — I5189 Other ill-defined heart diseases: Secondary | ICD-10-CM | POA: Diagnosis not present

## 2023-09-18 LAB — CBC
HCT: 30.5 % — ABNORMAL LOW (ref 36.0–46.0)
Hemoglobin: 9.7 g/dL — ABNORMAL LOW (ref 12.0–15.0)
MCH: 30.8 pg (ref 26.0–34.0)
MCHC: 31.8 g/dL (ref 30.0–36.0)
MCV: 96.8 fL (ref 80.0–100.0)
Platelets: 378 10*3/uL (ref 150–400)
RBC: 3.15 MIL/uL — ABNORMAL LOW (ref 3.87–5.11)
RDW: 13.7 % (ref 11.5–15.5)
WBC: 11.9 10*3/uL — ABNORMAL HIGH (ref 4.0–10.5)
nRBC: 0 % (ref 0.0–0.2)

## 2023-09-18 LAB — BASIC METABOLIC PANEL WITH GFR
Anion gap: 8 (ref 5–15)
BUN: 17 mg/dL (ref 8–23)
CO2: 32 mmol/L (ref 22–32)
Calcium: 8.7 mg/dL — ABNORMAL LOW (ref 8.9–10.3)
Chloride: 99 mmol/L (ref 98–111)
Creatinine, Ser: 0.72 mg/dL (ref 0.44–1.00)
GFR, Estimated: >60 mL/min (ref >60)
Glucose, Bld: 143 mg/dL — ABNORMAL HIGH (ref 70–99)
Potassium: 4.4 mmol/L (ref 3.5–5.1)
Sodium: 139 mmol/L (ref 135–145)

## 2023-09-18 LAB — C-REACTIVE PROTEIN: CRP: 1.1 mg/dL — ABNORMAL HIGH (ref <1.0)

## 2023-09-18 MED ORDER — MAGIC MOUTHWASH W/LIDOCAINE
10.0000 mL | Freq: Three times a day (TID) | ORAL | Status: AC
  Administered 2023-09-18 – 2023-09-23 (×20): 10 mL via ORAL
  Filled 2023-09-18 (×20): qty 10

## 2023-09-18 NOTE — Progress Notes (Signed)
 Occupational Therapy Treatment Patient Details Name: Pennelope Basque MRN: [id number] DOB: 26-Apr-1951 Today's Date: 09/18/2023   History of present illness 73 y.o. female admitted 09/11/23 with hypoxemia and PE. PMhx: Admission 3/26-4/2 with N/V, falls, CAP. Rt BKA, HTN, HLD, mild early Alzheimer's dementia, asthma, fibromyalgia, anxiety.   OT comments  Pt remains eager to participate in OOB activities. On entry, pt requesting personal assistance after BM in bed. Once peri care provided and clean pull up donned per pt request, pt able to don prosthetic LE with Setup assist. Pt then able to demo bed mobility and transfers with Supervision using RW. Limited in gait progression with HHFNC setup but SpO2 WFL throughout.      If plan is discharge home, recommend the following:  A little help with walking and/or transfers;A little help with bathing/dressing/bathroom;Assistance with cooking/housework;Assist for transportation;Help with stairs or ramp for entrance   Equipment Recommendations  None recommended by OT    Recommendations for Other Services      Precautions / Restrictions Precautions Precautions: Fall Recall of Precautions/Restrictions: Intact Precaution/Restrictions Comments: watch SpO2, HHFNC Restrictions Weight Bearing Restrictions Per Provider Order: No       Mobility Bed Mobility Overal bed mobility: Needs Assistance Bed Mobility: Supine to Sit, Rolling Rolling: Supervision   Supine to sit: Supervision, HOB elevated          Transfers Overall transfer level: Needs assistance Equipment used: Rolling walker (2 wheels) Transfers: Sit to/from Stand, Bed to chair/wheelchair/BSC Sit to Stand: Supervision     Step pivot transfers: Supervision     General transfer comment: from bed > recliner with RW. limited in further mobility due to [facility]     Balance Overall balance assessment: Needs assistance Sitting-balance support: No upper extremity supported, Feet  supported Sitting balance-Leahy Scale: Good     Standing balance support: Bilateral upper extremity supported, During functional activity Standing balance-Leahy Scale: Fair                             ADL either performed or assessed with clinical judgement   ADL Overall ADL's : Needs assistance/impaired Eating/Feeding: Independent;Sitting   Grooming: Set up;Sitting               Lower Body Dressing: Moderate assistance;Sitting/lateral leans;Bed level;Sit to/from stand Lower Body Dressing Details (indicate cue type and reason): Assist to doff soiled pull up bed level and don clean pull up via bridging. pt able to don prosthetic LE bed level and EOB with Setup assist     Toileting- Clothing Manipulation and Hygiene: Total assistance;Bed level Toileting - Clothing Manipulation Details (indicate cue type and reason): for cleanup after reported feeling of having a BM. Increased time needed for cleanup d/t purewick soiled/stuck            Extremity/Trunk Assessment Upper Extremity Assessment Upper Extremity Assessment: Overall WFL for tasks assessed;Right hand dominant   Lower Extremity Assessment Lower Extremity Assessment: Defer to PT evaluation        Vision   Vision Assessment?: No apparent visual deficits   Perception     Praxis     Communication Communication Communication: No apparent difficulties   Cognition Arousal: Alert Behavior During Therapy: WFL for tasks assessed/performed Cognition: No apparent impairments                               Following commands: Intact  Cueing   Cueing Techniques: Verbal cues  Exercises      Shoulder Instructions       General Comments Prior to OT session, pt noted on 35 L HHFNC, 65%FiO2. During session, noted pt on 20 L HHFNC. Maintaining high 90s at rest, low 90s with activity, denied significant SOB    Pertinent Vitals/ Pain       Pain Assessment Pain Assessment: No/denies  pain  Home Living                                          Prior Functioning/Environment              Frequency  Min 2X/week        Progress Toward Goals  OT Goals(current goals can now be found in the care plan section)  Progress towards OT goals: Progressing toward goals  Acute Rehab OT Goals Patient Stated Goal: wean down off of O2 OT Goal Formulation: With patient Time For Goal Achievement: 09/28/23 Potential to Achieve Goals: Good ADL Goals Pt Will Perform Upper Body Dressing: with modified independence Pt Will Perform Lower Body Dressing: with modified independence Pt Will Transfer to Toilet: with modified independence Pt Will Perform Toileting - Clothing Manipulation and hygiene: with modified independence Additional ADL Goal #1: Pt will be able to tolerate 5 mins of continuous OOB activity while maintaining safety awareness and stable O2 levels to maximize activity tolerance and return to PLOF.  Plan      Co-evaluation                 AM-PAC OT "6 Clicks" Daily Activity     Outcome Measure   Help from another person eating meals?: None Help from another person taking care of personal grooming?: A Little Help from another person toileting, which includes using toliet, bedpan, or urinal?: A Little Help from another person bathing (including washing, rinsing, drying)?: A Little Help from another person to put on and taking off regular upper body clothing?: A Little Help from another person to put on and taking off regular lower body clothing?: A Little 6 Click Score: 19    End of Session Equipment Utilized During Treatment: Rolling walker (2 wheels);Oxygen  OT Visit Diagnosis: Unsteadiness on feet (R26.81);Other abnormalities of gait and mobility (R26.89);Muscle weakness (generalized) (M62.81)   Activity Tolerance Patient tolerated treatment well   Patient Left in chair;with call bell/phone within reach;with chair alarm set    Nurse Communication Other (comment) (NT - new purewick)        Time: 1610-9604 OT Time Calculation (min): 42 min  Charges: OT General Charges $OT Visit: 1 Visit OT Treatments $Self Care/Home Management : 23-37 mins $Therapeutic Activity: 8-22 mins  Lawrence Pretty, OTR/L Acute Rehab Services Office: [phone]   Annabella Barr 09/18/2023, 10:20 AM

## 2023-09-18 NOTE — Progress Notes (Signed)
   09/18/23 1926  BiPAP/CPAP/SIPAP  BiPAP/CPAP/SIPAP Pt Type Adult  Reason BIPAP/CPAP not in use Non-compliant  BiPAP/CPAP /SiPAP Vitals  Pulse Rate 71  Resp (!) 23  SpO2 95 %  Bilateral Breath Sounds Clear;Diminished  MEWS Score/Color  MEWS Score 1  MEWS Score Color Marrie Sizer

## 2023-09-18 NOTE — TOC Progression Note (Signed)
 Transition of Care [facility]) - Progression Note    Patient Details  Name: Shelly [name] MRN: [id number] Date of Birth: 1951/05/05  Transition of [facility] CM/SW Contact  Jannine Meo, RN Phone Number: 09/18/2023, 12:11 PM  Clinical Narrative:   Progression rounds report, pt on HFNC 35 L @ 65%, not close to baseline.          Expected Discharge Plan and Services                                   HH Arranged: PT, OT, RN, Disease Management, Nurse's Aide   Date [facility] Agency Contacted: 09/13/23 Time HH Agency Contacted: 1000 Representative spoke with at [facility] Agency: Randel Buss   Social Determinants of Health (SDOH) Interventions SDOH Screenings   Food Insecurity: No Food Insecurity (09/11/2023)  Housing: Low Risk  (09/11/2023)  Transportation Needs: No Transportation Needs (09/11/2023)  Utilities: Not At Risk (09/11/2023)  Social Connections: Moderately Isolated (09/11/2023)  Tobacco Use: Medium Risk (09/11/2023)    Readmission Risk Interventions    09/12/2023    1:24 PM 09/05/2023    4:03 PM  Readmission Risk Prevention Plan  Transportation Screening Complete Complete  PCP or Specialist Appt within 5-7 Days  Complete  PCP or Specialist Appt within 3-5 Days Complete   Home Care Screening  Complete  Medication Review (RN CM)  Complete  HRI or Home Care Consult Complete   Social Work Consult for Recovery Care Planning/Counseling Complete   Palliative Care Screening Not Applicable   Medication Review Oceanographer) Referral to Pharmacy

## 2023-09-18 NOTE — Plan of Care (Signed)
 progressing

## 2023-09-18 NOTE — Progress Notes (Signed)
 NAME:  Shelly [name], MRN:  [id number], DOB:  03/01/1951, LOS: 7 ADMISSION DATE:  09/11/2023, CONSULTATION DATE:  09/11/23 REFERRING MD:  Dr. Katrinka Blazing, CHIEF COMPLAINT:  SOB   History of Present Illness:   45 yoF with PMH of HTN, HLD, PAD, Right [place] artery stenosis, asthma, RLE avascular necrosis s/p BKA, alzheimers dementia, and former smoker presenting from home with recurrent SOB and hypoxia.  Recently hospitalized 3/26- 4/2 for hypoxic respiratory failure felt related to rhinovirus, RLL strep PNA +/- aspiration PNA (given N/V), possible AECOPD.  Discharged home on 3-4L [place] with 3 days left on steroid taper.    Felt initially better but with progressive SOB, found hypoxic in the 60's on HOT w/ EMS improved on HFNC.  No reported recent fevers, increased cough, productive at times, denies any dysphagia, and complains slightly of left calf pain.  Placed on BiPAP for increased WOB in ER, requiring 60%.  Afebrile, low to normotensive BP.  CXR showing multifocal infiltrates.  SARS/ flu/ RSV neg.  BNP 469, trop hs flat x3 in 20-30's.  PCT 0.13, WBC 18.2.  Empiric vancomycin and cefepime started.  TRH called for admit.  Lactic increased 1.4> 2.  CTA chest showed few scattered segmental PE on left, mild clot burden but changing parenchymal lung opacities of interstitial septal thickening GGO, more left sided with several enlarged mediastinal nodules.  Since in ER awaiting bed, unable to tolerate BiPAP wean due to WOB and hypoxia.  PCCM called for further evaluation.   Pertinent  Medical History  HTN, HLD, PAD, Right [place] artery stenosis, asthma, RLE avascular necrosis s/p BKA, alzheimers dementia, former smoker  Significant Hospital Events: Including procedures, antibiotic start and stop dates in addition to other pertinent events   4/7 Admit recurrent hypoxic respiratory failure, PE, GGO on bipap 4/11 Back on heated high flow overnight, more short of breath today morning 4/12 H HFNC remains at 70% FiO2 35 L,  patient reports improvement in dyspnea this a.m.  Interim History / Subjective:  Unchanged O2 requirement Tolerating steroids Objective   Blood pressure 118/65, pulse 66, temperature 98.1 F (36.7 C), temperature source Axillary, resp. rate (!) 22, height 5\' 5"  (1.651 m), weight 68.1 kg, SpO2 96%.    FiO2 (%):  [65 %] 65 %   Intake/Output Summary (Last 24 hours) at 09/18/2023 1416 Last data filed at 09/18/2023 1150 Gross per 24 hour  Intake 170.68 ml  Output 900 ml  Net -729.32 ml    Filed Weights   09/14/23 0500 09/17/23 0545 09/18/23 0831  Weight: 68.1 kg 71.5 kg 68.1 kg   Physical Exam: General: Chronically ill-appearing and elderly, no acute distress HENT: [place], AT, OP clear, MMM Eyes: EOMI, no scleral icterus Respiratory: Diminished, inspiratory fine crackles to auscultation bilaterally.  Cardiovascular: RRR, -M/R/G, no JVD Extremities:-Edema,-tenderness Neuro: AAO x4, CNII-XII grossly intact  WBC improved to 11 CRP improved to 1.1     Resolved Hospital Problem list    Assessment & Plan:  Recurrent hypoxic respiratory failure with increased bilateral GGO opacities, L> R - recent sed rate 68 on 4/1. Improving Patulous esophagus on chest CT with calcified node in subcarinal and left hilar c/w old granulomatous disease - recent tx rhinovirus, RLL strep pna (completed 4/2), and steroids (completed 4/5) - ddx include infectious vs inflammatory/ underlying lung process vs edema component.  Possible ongoing aspiration component as well.  Worsening leukocytosis but recently completed steroids, remains afebrile and PCT low.  PE not significant enough to be  contributing to WOB/ hypoxia. She may have underlying interstitial lung disease.  CTD serologies are pending Hx of COPD P:  Wean HHFNC for goal >88% Continue solumedrol 60 mg BID Completed 7 days of Zosyn Agree holding on diuresis Encourage pulmonary hygiene Mobilize as above Continue triple nebs therapy and as needed  DuoNebs Mucinex  Concern for aspiration P: SLP following, appreciate assistance  Segmental left PE LLE dvt - L sided, mild clot burden, no evidence of heart strain on CT P: Continue Eliquis  HFpEF HTN HLD Alzheimer's Macrocytic anemia P: -per primary  Best Practice (right click and "Reselect all SmartList Selections" daily)   Per primary  Signature:   Care Time: 25 min  Genetta Kenning, M.D. [facility] Pulmonary/Critical Care Medicine 09/18/2023 2:16 PM   See Amion for personal pager For hours between 7 PM to 7 AM, please call Elink for urgent questions

## 2023-09-18 NOTE — Progress Notes (Signed)
 PROGRESS NOTE        PATIENT DETAILS Name: Shelly [name] Age: 73 y.o. Sex: female Date of Birth: 1950/08/05 Admit Date: 09/11/2023 Admitting Physician Lorin Glass, MD OZH:[id number], Sonny Masters, MD  Brief Summary: Patient is a 73 y.o.  female with history of dementia, right BKA, HTN, HLD, COPD-who was hospitalized from 3/26-4/2 for acute hypoxic respiratory failure secondary to rhinovirus/strep pneumonia infection-patient was stabilized-discharged on home oxygen-presented to the ED on 4/7 with worsening shortness of breath-found to be severely hypoxemic-CTA chest showed a small PE-she was started on heated high flow and subsequently admitted by the hospitalist service but due to worsening hypoxemia-she was transferred to the ICU for close monitoring.  She was stabilized and transferred back to [facility] on 4/10.  Significant events: 3/26-4/2>> hospitalization for hypoxia-rhinovirus/strep pneumo antigen positive-discharged on home O2 4/7>> worsening shortness of breath-worsening hypoxemia-admit to [facility] but later transferred to ICU 4/10>> transferred to [facility] 4/11>> O2 saturation in the 80s on 15 L of HFNC-CXR unchanged-switched to heated high flow-IV Lasix started-prednisone switched to Solu-Medrol.  Significant studies: 4/7>> CTA chest: Few scattered segmental PE medial left upper lobe/middle lobe-changing parenchymal lung opacities with areas of interstitial septal thickening and groundglass opacities. 4/8>> echo: EF 65-70% 4/8>> B/L lower extremity Doppler: Age indeterminate DVT left peroneal vein  Significant microbiology data: 4/7>> COVID/influenza/RSV PCR: Negative 4/7>> sputum culture: Pending 4/7>> blood culture: No growth  Procedures: None  Consults: PCCM  Subjective: No complaints-unchanged-remains on heated high flow.  Objective: Vitals: Blood pressure (!) 108/56, pulse 71, temperature 97.9 F (36.6 C), resp. rate (!) 25, height 5\' 5"  (1.651 m),  weight 68.1 kg, SpO2 99%.   Exam: Gen Exam:Alert awake-not in any distress HEENT:atraumatic, normocephalic Chest: B/L clear to auscultation anteriorly CVS:S1S2 regular Abdomen:soft non tender, non distended Extremities:no edema Neurology: Non focal Skin: no rash  Pertinent Labs/Radiology:    Latest Ref Rng & Units 09/18/2023    4:32 AM 09/17/2023    7:28 AM 09/16/2023    6:02 AM  CBC  WBC 4.0 - 10.5 K/uL 11.9  14.5  11.9   Hemoglobin 12.0 - 15.0 g/dL 9.7  9.4  9.4   Hematocrit 36.0 - 46.0 % 30.5  28.9  29.4   Platelets 150 - 400 K/uL 378  380  356     Lab Results  Component Value Date   NA 139 09/18/2023   K 4.4 09/18/2023   CL 99 09/18/2023   CO2 32 09/18/2023     Assessment/Plan: Acute hypoxic respiratory failure secondary to recent rhinovirus/strep pneumoniae-possible interstitial lung disease related to recent pneumonia-and now small PE No major issues overnight-remains unchanged-on heated high flow Remains on IV Solu-Medrol (switched from prednisone to Solu-Medrol on 4/11 given worsening hypoxemia) Volume status stable-does not require Lasix (developed hypotension on 4/12 after IV Lasix) Zosyn empirically x 7 days total Mobilize with PT/OT Continue incentive spirometry/flutter valve Per SLP-no obvious signs of aspiration-tolerating dysphagia 3 diet well Continue Trileptal slowly titrate down FiO2 PCCM following-await further recommendations.   May require repeat ambulatory O2 sat-possible that may require more oxygen than discharged on recently.  Segmental left-sided pulmonary embolism with left peroneal vein DVT Continue Eliquis  Normocytic anemia Likely due to critical illness No evidence of blood loss Folate/vitamin B12 level stable. Follow CBC  Chronic HFpEF Euvolemic IV Lasix as needed to maintain negative balance  COPD  Not in exacerbation Continue bronchodilators  HLD Statin  HTN BP stable-losartan remains on hold  Mood  disorder Stable Continue Wellbutrin/Prozac  Dementia Delirium precautions Aricept  History of right BKA-uses prosthesis  Code status:   Code Status: Full Code   DVT Prophylaxis: apixaban (ELIQUIS) tablet 10 mg  apixaban (ELIQUIS) tablet 5 mg    Family Communication: Daughter-[phone]-updated 4/14  Disposition Plan: Status is: Inpatient Remains inpatient appropriate because: Severity of illness   Planned Discharge Destination:Home   Diet: Diet Order             DIET DYS 3 Room service appropriate? Yes with Assist; Fluid consistency: Thin  Diet effective now                     Antimicrobial agents: Anti-infectives (From admission, onward)    Start     Dose/Rate Route Frequency Ordered Stop   09/16/23 1600  piperacillin-tazobactam (ZOSYN) IVPB 3.375 g       Placed in "Followed by" Linked Group   3.375 g 12.5 mL/hr over 240 Minutes Intravenous Every 8 hours 09/16/23 1144 09/19/23 1559   09/11/23 2200  vancomycin (VANCOREADY) IVPB 750 mg/150 mL  Status:  Discontinued        750 mg 150 mL/hr over 60 Minutes Intravenous Every 12 hours 09/11/23 0926 09/12/23 0958   09/11/23 2200  piperacillin-tazobactam (ZOSYN) IVPB 3.375 g  Status:  Discontinued       Placed in "Followed by" Linked Group   3.375 g 12.5 mL/hr over 240 Minutes Intravenous Every 8 hours 09/11/23 1549 09/16/23 1144   09/11/23 1600  azithromycin (ZITHROMAX) 500 mg in sodium chloride 0.9 % 250 mL IVPB        500 mg 250 mL/hr over 60 Minutes Intravenous Every 24 hours 09/11/23 1547 09/14/23 0857   09/11/23 1600  piperacillin-tazobactam (ZOSYN) IVPB 3.375 g       Placed in "Followed by" Linked Group   3.375 g 100 mL/hr over 30 Minutes Intravenous  Once 09/11/23 1549 09/11/23 2000   09/11/23 1200  ceFEPIme (MAXIPIME) 2 g in sodium chloride 0.9 % 100 mL IVPB  Status:  Discontinued        2 g 200 mL/hr over 30 Minutes Intravenous Every 8 hours 09/11/23 0926 09/11/23 1549   09/11/23 0615   vancomycin (VANCOCIN) IVPB 1000 mg/200 mL premix  Status:  Discontinued        1,000 mg 200 mL/hr over 60 Minutes Intravenous  Once 09/11/23 0603 09/11/23 0605   09/11/23 0615  ceFEPIme (MAXIPIME) 2 g in sodium chloride 0.9 % 100 mL IVPB        2 g 200 mL/hr over 30 Minutes Intravenous  Once 09/11/23 0603 09/11/23 0706   09/11/23 0615  vancomycin (VANCOREADY) IVPB 1500 mg/300 mL        1,500 mg 150 mL/hr over 120 Minutes Intravenous  Once 09/11/23 0605 09/11/23 0920        MEDICATIONS: Scheduled Meds:  apixaban  10 mg Oral BID   Followed by   Melene Muller ON 09/20/2023] apixaban  5 mg Oral BID   arformoterol  15 mcg Nebulization BID   budesonide (PULMICORT) nebulizer solution  0.5 mg Nebulization BID   buPROPion  300 mg Oral Daily   Chlorhexidine Gluconate Cloth  6 each Topical Q0600   donepezil  10 mg Oral QHS   FLUoxetine  20 mg Oral Daily   guaiFENesin  600 mg Oral BID   melatonin  3 mg  Oral QHS   methylPREDNISolone (SOLU-MEDROL) injection  60 mg Intravenous Q12H   montelukast  10 mg Oral QHS   pantoprazole  40 mg Oral Daily   revefenacin  175 mcg Nebulization Daily   rosuvastatin  10 mg Oral QHS   sodium chloride flush  3 mL Intravenous Q12H   Continuous Infusions:  piperacillin-tazobactam (ZOSYN)  IV 3.375 g (09/18/23 0823)   PRN Meds:.acetaminophen **OR** acetaminophen, benzocaine, ipratropium-albuterol, magic mouthwash w/lidocaine, ondansetron **OR** ondansetron (ZOFRAN) IV   I have personally reviewed following labs and imaging studies  LABORATORY DATA: CBC: Recent Labs  Lab 09/14/23 0440 09/15/23 0551 09/16/23 0602 09/17/23 0728 09/18/23 0432  WBC 11.9* 11.1* 11.9* 14.5* 11.9*  HGB 8.6* 9.2* 9.4* 9.4* 9.7*  HCT 26.9* 28.9* 29.4* 28.9* 30.5*  MCV 97.5 97.3 95.8 95.7 96.8  PLT 338 330 356 380 378    Basic Metabolic Panel: Recent Labs  Lab 09/14/23 0440 09/15/23 0551 09/16/23 0602 09/17/23 0728 09/18/23 0432  NA 139 139 138 137 139  K 3.6 3.9 3.6 3.6  4.4  CL 100 98 95* 95* 99  CO2 32 30 31 33* 32  GLUCOSE 110* 78 122* 130* 143*  BUN 20 19 16 16 17   CREATININE 0.62 0.64 0.62 0.60 0.72  CALCIUM 8.7* 8.7* 8.7* 8.6* 8.7*    GFR: Estimated Creatinine Clearance: 55.5 mL/min (by C-G formula based on SCr of 0.72 mg/dL).  Liver Function Tests: No results for input(s): "AST", "ALT", "ALKPHOS", "BILITOT", "PROT", "ALBUMIN" in the last 168 hours.  No results for input(s): "LIPASE", "AMYLASE" in the last 168 hours. No results for input(s): "AMMONIA" in the last 168 hours.  Coagulation Profile: No results for input(s): "INR", "PROTIME" in the last 168 hours.   Cardiac Enzymes: No results for input(s): "CKTOTAL", "CKMB", "CKMBINDEX", "TROPONINI" in the last 168 hours.  BNP (last 3 results) No results for input(s): "PROBNP" in the last 8760 hours.  Lipid Profile: No results for input(s): "CHOL", "HDL", "LDLCALC", "TRIG", "CHOLHDL", "LDLDIRECT" in the last 72 hours.  Thyroid Function Tests: No results for input(s): "TSH", "T4TOTAL", "FREET4", "T3FREE", "THYROIDAB" in the last 72 hours.  Anemia Panel: No results for input(s): "VITAMINB12", "FOLATE", "FERRITIN", "TIBC", "IRON", "RETICCTPCT" in the last 72 hours.   Urine analysis:    Component Value Date/Time   COLORURINE YELLOW 09/11/2023 0812   APPEARANCEUR HAZY (A) 09/11/2023 0812   LABSPEC 1.018 09/11/2023 0812   PHURINE 5.0 09/11/2023 0812   GLUCOSEU NEGATIVE 09/11/2023 0812   GLUCOSEU NEGATIVE 09/08/2010 1617   HGBUR NEGATIVE 09/11/2023 0812   BILIRUBINUR NEGATIVE 09/11/2023 0812   KETONESUR 20 (A) 09/11/2023 0812   PROTEINUR NEGATIVE 09/11/2023 0812   UROBILINOGEN 0.2 09/08/2010 1617   NITRITE NEGATIVE 09/11/2023 0812   LEUKOCYTESUR NEGATIVE 09/11/2023 0812    Sepsis Labs: Lactic Acid, Venous    Component Value Date/Time   LATICACIDVEN 1.0 09/12/2023 0511    MICROBIOLOGY: Recent Results (from the past 240 hours)  Blood Culture (routine x 2)     Status: None    Collection Time: 09/11/23  5:39 AM   Specimen: BLOOD  Result Value Ref Range Status   Specimen Description BLOOD BLOOD LEFT ARM  Final   Special Requests   Final    BOTTLES DRAWN AEROBIC AND ANAEROBIC Blood Culture results may not be optimal due to an inadequate volume of blood received in culture bottles   Culture   Final    NO GROWTH 5 DAYS Performed at [facility], [address] [place], [place] [address]    Report Status 09/16/2023 FINAL  Final  Resp panel by RT-PCR (RSV, Flu A&B, Covid) Anterior Nasal Swab     Status: None   Collection Time: 09/11/23  5:41 AM   Specimen: Anterior Nasal Swab  Result Value Ref Range Status   SARS Coronavirus 2 by RT PCR NEGATIVE NEGATIVE Final   Influenza A by PCR NEGATIVE NEGATIVE Final   Influenza B by PCR NEGATIVE NEGATIVE Final    Comment: (NOTE) The Xpert Xpress SARS-CoV-2/FLU/RSV plus assay is intended as an aid in the diagnosis of influenza from Nasopharyngeal swab specimens and should not be used as a sole basis for treatment. Nasal washings and aspirates are unacceptable for Xpert Xpress SARS-CoV-2/FLU/RSV testing.  Fact Sheet for Patients: BloggerCourse.com  Fact Sheet for Healthcare Providers: SeriousBroker.it  This test is not yet approved or cleared by the [place]  FDA and has been authorized for detection and/or diagnosis of SARS-CoV-2 by FDA under an Emergency Use Authorization (EUA). This EUA will remain in effect (meaning this test can be used) for the duration of the COVID-19 declaration under Section 564(b)(1) of the Act, 21 U.S.C. section 360bbb-3(b)(1), unless the authorization is terminated or revoked.     Resp Syncytial Virus by PCR NEGATIVE NEGATIVE Final    Comment: (NOTE) Fact Sheet for Patients: BloggerCourse.com  Fact Sheet for Healthcare Providers: SeriousBroker.it  This test is not yet  approved or cleared by the [place]  FDA and has been authorized for detection and/or diagnosis of SARS-CoV-2 by FDA under an Emergency Use Authorization (EUA). This EUA will remain in effect (meaning this test can be used) for the duration of the COVID-19 declaration under Section 564(b)(1) of the Act, 21 U.S.C. section 360bbb-3(b)(1), unless the authorization is terminated or revoked.  Performed at [facility], [address] [place], [place] [address]   Blood Culture (routine x 2)     Status: None   Collection Time: 09/11/23  6:06 AM   Specimen: BLOOD  Result Value Ref Range Status   Specimen Description BLOOD BLOOD LEFT HAND  Final   Special Requests   Final    BOTTLES DRAWN AEROBIC AND ANAEROBIC Blood Culture adequate volume   Culture   Final    NO GROWTH 5 DAYS Performed at [facility], [address], [place], [place] [address]    Report Status 09/16/2023 FINAL  Final  Expectorated Sputum Assessment w Gram Stain, Rflx to Resp Cult     Status: None   Collection Time: 09/11/23  9:48 AM   Specimen: Sputum  Result Value Ref Range Status   Specimen Description SPUTUM  Final   Special Requests Normal  Final   Sputum evaluation   Final    THIS SPECIMEN IS ACCEPTABLE FOR SPUTUM CULTURE Performed at [facility], [address] [place], [place] [address]    Report Status 09/13/2023 FINAL  Final  Culture, Respiratory w Gram Stain     Status: None   Collection Time: 09/11/23  9:48 AM   Specimen: SPU  Result Value Ref Range Status   Specimen Description SPUTUM  Final   Special Requests Normal Reflexed from [id number]  Final   Gram Stain   Final    NO WBC SEEN RARE BUDDING YEAST SEEN Performed at [facility], [address] [place], [place] [address]    Culture   Final    FEW KLEBSIELLA PNEUMONIAE ABUNDANT CANDIDA ALBICANS    Report  Status 09/16/2023 FINAL  Final   Organism ID, Bacteria KLEBSIELLA PNEUMONIAE  Final      Susceptibility    Klebsiella pneumoniae - MIC*    AMPICILLIN RESISTANT Resistant     CEFEPIME <=0.12 SENSITIVE Sensitive     CEFTAZIDIME <=1 SENSITIVE Sensitive     CEFTRIAXONE <=0.25 SENSITIVE Sensitive     CIPROFLOXACIN <=0.25 SENSITIVE Sensitive     GENTAMICIN <=1 SENSITIVE Sensitive     IMIPENEM <=0.25 SENSITIVE Sensitive     TRIMETH/SULFA <=20 SENSITIVE Sensitive     AMPICILLIN/SULBACTAM 4 SENSITIVE Sensitive     PIP/TAZO <=4 SENSITIVE Sensitive ug/mL    * FEW KLEBSIELLA PNEUMONIAE  MRSA Next Gen by PCR, Nasal     Status: None   Collection Time: 09/11/23  5:39 PM   Specimen: Nasal Mucosa; Nasal Swab  Result Value Ref Range Status   MRSA by PCR Next Gen NOT DETECTED NOT DETECTED Final    Comment: (NOTE) The GeneXpert MRSA Assay (FDA approved for NASAL specimens only), is one component of a comprehensive MRSA colonization surveillance program. It is not intended to diagnose MRSA infection nor to guide or monitor treatment for MRSA infections. Test performance is not FDA approved in patients less than 68 years old. Performed at [facility], [address] [place], [place] [address]     RADIOLOGY STUDIES/RESULTS: DG Chest Port 1 View Result Date: 09/17/2023 CLINICAL DATA:  Dyspnea EXAM: PORTABLE CHEST 1 VIEW COMPARISON:  September 15, 2023 FINDINGS: Low lung volumes. Similar distribution of the patchy airspace opacities throughout the lungs. No pneumothorax. Blunting of both costophrenic sulci, possible left and small pleural effusions. Unchanged cardiomegaly. Tortuous aorta with aortic atherosclerosis. Multilevel thoracic osteophytosis. Spinal stimulator device leads again noted in the mid thorax. IMPRESSION: No significant interval change to the airspace disease throughout the lungs. Electronically Signed   By: Rance Burrows M.D.   On: 09/17/2023 08:46     LOS: 7 days   Kimberly Penna, MD  [facility]    To contact the attending provider between 7A-7P or the covering provider  during after hours 7P-7A, please log into the web site [email or web address] and access using universal [place] password for that web site. If you do not have the password, please call the hospital operator.  09/18/2023, 10:08 AM

## 2023-09-18 NOTE — Progress Notes (Signed)
 Titrated patient off HHFC to a [place] HFNC at 6L. Patient tolerated well. RN notified.

## 2023-09-18 NOTE — Progress Notes (Signed)
 Physical Therapy Treatment Patient Details Name: Shelly [name] MRN: [id number] DOB: 1950-07-16 Today's Date: 09/18/2023   History of Present Illness 73 y.o. female admitted 09/11/23 with hypoxemia and PE. PMhx: Admission 3/26-4/2 with N/V, falls, CAP. Rt BKA, HTN, HLD, mild early Alzheimer's dementia, asthma, fibromyalgia, anxiety.    PT Comments  Emphasis on LE exercises, transfer training, and pre-gait/standing activities as tolerated today while on HHFNC attached to wall supply. Sats nadir at 87% briefly while performing standing exercises; improve to 93% while sitting at rest - HHFNC 60% FiO2. RLE prosthesis donned. Min assist for transfers with hand held support. Patient will continue to benefit from skilled physical therapy services to further improve independence with functional mobility.    If plan is discharge home, recommend the following: Assist for transportation;Help with stairs or ramp for entrance;Assistance with cooking/housework   Can travel by private vehicle        Equipment Recommendations  None recommended by PT    Recommendations for Other Services       Precautions / Restrictions Precautions Precautions: Fall Recall of Precautions/Restrictions: Intact Precaution/Restrictions Comments: watch SpO2, HHFNC Restrictions Weight Bearing Restrictions Per Provider Order: No     Mobility  Bed Mobility               General bed mobility comments: In recliner    Transfers Overall transfer level: Needs assistance Equipment used: 1 person hand held assist Transfers: Sit to/from Stand Sit to Stand: Min assist           General transfer comment: Min assist for sit to stand transfer training with single UE support. Cues for LUE to push from recliner. Performed x2.    Ambulation/Gait             Pre-gait activities: Static march, weight shift lateral and AP. SpO2 down to 88%.     Stairs             Wheelchair Mobility     Tilt Bed     Modified Rankin (Stroke Patients Only)       Balance Overall balance assessment: Needs assistance Sitting-balance support: No upper extremity supported, Feet supported Sitting balance-Leahy Scale: Good     Standing balance support: During functional activity, Single extremity supported Standing balance-Leahy Scale: Poor Standing balance comment: Single UE to steady. RLE prosthesis on.                            Communication Communication Communication: No apparent difficulties  Cognition Arousal: Alert Behavior During Therapy: WFL for tasks assessed/performed   PT - Cognitive impairments: History of cognitive impairments                         Following commands: Intact      Cueing Cueing Techniques: Verbal cues  Exercises General Exercises - Lower Extremity Ankle Circles/Pumps: AROM, Left, 15 reps, Seated Gluteal Sets: Strengthening, Both, 15 reps, Seated Long Arc Quad: Strengthening, Both, 15 reps, Seated Hip ABduction/ADduction: Strengthening, Both, 15 reps, Seated Hip Flexion/Marching: Strengthening, Both, 15 reps, Seated Other Exercises Other Exercises: Standing march    General Comments General comments (skin integrity, edema, etc.): 15L 60% FiO2 on HHFNC. Sats ranged 87-93% throughout session, recover to 90s with seated rest.      Pertinent Vitals/Pain Pain Assessment Pain Assessment: No/denies pain    Home Living  Prior Function            PT Goals (current goals can now be found in the care plan section) Acute Rehab PT Goals Patient Stated Goal: return home with plans to transition to [facility] soon PT Goal Formulation: With patient/family Time For Goal Achievement: 09/27/23 Potential to Achieve Goals: Good Progress towards PT goals: Progressing toward goals    Frequency    Min 2X/week      PT Plan      Co-evaluation              AM-PAC PT "6  Clicks" Mobility   Outcome Measure  Help needed turning from your back to your side while in a flat bed without using bedrails?: None Help needed moving from lying on your back to sitting on the side of a flat bed without using bedrails?: A Little Help needed moving to and from a bed to a chair (including a wheelchair)?: A Little Help needed standing up from a chair using your arms (e.g., wheelchair or bedside chair)?: A Little Help needed to walk in hospital room?: A Little Help needed climbing 3-5 steps with a railing? : Total 6 Click Score: 17    End of Session Equipment Utilized During Treatment: Oxygen Activity Tolerance: Patient tolerated treatment well;Other (comment) (Gait limited by HHFNC attached to wall supply) Patient left: in chair;with call bell/phone within reach;with chair alarm set Nurse Communication: Mobility status PT Visit Diagnosis: Other abnormalities of gait and mobility (R26.89);Muscle weakness (generalized) (M62.81)     Time: 3664-4034 PT Time Calculation (min) (ACUTE ONLY): 21 min  Charges:    $Therapeutic Exercise: 8-22 mins PT General Charges $$ ACUTE PT VISIT: 1 Visit                     Jory Ng, PT, DPT [facility]  Rehabilitation Services Physical Therapist Office: [phone] Website: [email or web address]    Alinda Irani 09/18/2023, 3:41 PM

## 2023-09-19 DIAGNOSIS — I2609 Other pulmonary embolism with acute cor pulmonale: Secondary | ICD-10-CM | POA: Diagnosis not present

## 2023-09-19 DIAGNOSIS — I5189 Other ill-defined heart diseases: Secondary | ICD-10-CM | POA: Diagnosis not present

## 2023-09-19 DIAGNOSIS — I2699 Other pulmonary embolism without acute cor pulmonale: Secondary | ICD-10-CM | POA: Diagnosis not present

## 2023-09-19 DIAGNOSIS — J9601 Acute respiratory failure with hypoxia: Secondary | ICD-10-CM | POA: Diagnosis not present

## 2023-09-19 DIAGNOSIS — J189 Pneumonia, unspecified organism: Secondary | ICD-10-CM | POA: Diagnosis not present

## 2023-09-19 LAB — BASIC METABOLIC PANEL WITH GFR
Anion gap: 7 (ref 5–15)
BUN: 16 mg/dL (ref 8–23)
CO2: 32 mmol/L (ref 22–32)
Calcium: 8.6 mg/dL — ABNORMAL LOW (ref 8.9–10.3)
Chloride: 101 mmol/L (ref 98–111)
Creatinine, Ser: 0.65 mg/dL (ref 0.44–1.00)
GFR, Estimated: >60 mL/min (ref >60)
Glucose, Bld: 127 mg/dL — ABNORMAL HIGH (ref 70–99)
Potassium: 4.1 mmol/L (ref 3.5–5.1)
Sodium: 140 mmol/L (ref 135–145)

## 2023-09-19 MED ORDER — BENZONATATE 200 MG PO CAPS: 200.0000 mg | ORAL_CAPSULE | Freq: Three times a day (TID) | ORAL | 1 refills | Status: DC | PRN

## 2023-09-19 MED ORDER — METHYLPREDNISOLONE SODIUM SUCC 125 MG IJ SOLR
60.0000 mg | Freq: Every day | INTRAMUSCULAR | Status: DC
  Administered 2023-09-19 – 2023-09-22 (×4): 60 mg via INTRAVENOUS
  Filled 2023-09-19 (×4): qty 2

## 2023-09-19 NOTE — Progress Notes (Signed)
 Speech Language Pathology Treatment: Dysphagia  Patient Details Name: Shelly [name] MRN: [id number] DOB: 02-11-1951 Today's Date: 09/19/2023 Time: 7829-5621 SLP Time Calculation (min) (ACUTE ONLY): 22 min  Assessment / Plan / Recommendation Clinical Impression  Pt was seen for dysphagia tx with a focus on reinforcing compensatory strategies. Pt notes that she experience increased breathing difficulty this morning but is feeling better now. Compared to previous session, pt's oxygen requirement is below 40 L/m. Pt states that she is still having trouble with her mouth wounds, especially when she is eating more solid foods. Pt was observed with thin-liquids and demonstrated understanding of compensatory strategies discussed in previous session (chin-tuck, effortful swallow, throat clear/cough after 2-3 bites/sips). Pt was independent with strategies and displayed no overt signs of aspiration throughout session. SLP discussed current diet with pt and pt feels that she wants to stick with dysphagia 3 until she goes home. Pt was interested in getting vanilla Ensure to help with supplementing nutrition due to oral discomfort. SLP provided vanilla Ensure and pt utilized compensatory strategies again with independence and no signs of discomfort.    Due to pt's compliance with swallow strategies and completed education, SLP will sign off.    HPI HPI: 5 yoF presenting from home with recurrent SOB and hypoxia.  Recently hospitalized 3/26- 4/2 for hypoxic respiratory failure felt related to rhinovirus, RLL strep PNA +/- aspiration PNA (given N/V), possible AECOPD.  Discharged home on 3-4L [place] with 3 days left on steroid taper. Felt initially better but with progressive SOB, found hypoxic in the 60's on HOT w/ EMS improved on HFNC.  No reported recent fevers, increased cough, productive at times, denies any dysphagia, and complains slightly of left calf pain.  Placed on BiPAP for increased WOB in ER, requiring  60%.  Afebrile, low to normotensive BP.  CXR showing multifocal infiltrates.  Since in ER awaiting bed, unable to tolerate BiPAP wean due to WOB and hypoxia. CT angiogram of the chest revealed concerns for scattered left-sided pulmonary emboli with mild clot burden, and changing parenchymal lung opacities with areas of tissue and septal thickening left lung worse than the right. Pt evalauted by SLP in initial admission, no finding of dysphagia.      SLP Plan  All goals met      Recommendations for follow up therapy are one component of a multi-disciplinary discharge planning process, led by the attending physician.  Recommendations may be updated based on patient status, additional functional criteria and insurance authorization.    Recommendations  Liquids provided via: Cup;Straw Compensations: Small sips/bites;Clear throat intermittently;Chin tuck;Use straw to facilitate chin tuck;Effortful swallow Postural Changes and/or Swallow Maneuvers: Seated upright 90 degrees                  Oral care BID   PRN       All goals met     Aurelia Leeks  09/19/2023, 1:32 PM

## 2023-09-19 NOTE — Telephone Encounter (Signed)
 Refill sent.

## 2023-09-19 NOTE — Plan of Care (Signed)

## 2023-09-19 NOTE — Progress Notes (Signed)
 NAME:  Shelly [name], MRN:  [id number], DOB:  Jul 23, 1950, LOS: 8 ADMISSION DATE:  09/11/2023, CONSULTATION DATE:  09/11/23 REFERRING MD:  Dr. Katrinka Blazing, CHIEF COMPLAINT:  SOB   History of Present Illness:   18 yoF with PMH of HTN, HLD, PAD, Right [place] artery stenosis, asthma, RLE avascular necrosis s/p BKA, alzheimers dementia, and former smoker presenting from home with recurrent SOB and hypoxia.  Recently hospitalized 3/26- 4/2 for hypoxic respiratory failure felt related to rhinovirus, RLL strep PNA +/- aspiration PNA (given N/V), possible AECOPD.  Discharged home on 3-4L [place] with 3 days left on steroid taper.    Felt initially better but with progressive SOB, found hypoxic in the 60's on HOT w/ EMS improved on HFNC.  No reported recent fevers, increased cough, productive at times, denies any dysphagia, and complains slightly of left calf pain.  Placed on BiPAP for increased WOB in ER, requiring 60%.  Afebrile, low to normotensive BP.  CXR showing multifocal infiltrates.  SARS/ flu/ RSV neg.  BNP 469, trop hs flat x3 in 20-30's.  PCT 0.13, WBC 18.2.  Empiric vancomycin and cefepime started.  TRH called for admit.  Lactic increased 1.4> 2.  CTA chest showed few scattered segmental PE on left, mild clot burden but changing parenchymal lung opacities of interstitial septal thickening GGO, more left sided with several enlarged mediastinal nodules.  Since in ER awaiting bed, unable to tolerate BiPAP wean due to WOB and hypoxia.  PCCM called for further evaluation.   Pertinent  Medical History  HTN, HLD, PAD, Right [place] artery stenosis, asthma, RLE avascular necrosis s/p BKA, alzheimers dementia, former smoker  Significant Hospital Events: Including procedures, antibiotic start and stop dates in addition to other pertinent events   4/7 Admit recurrent hypoxic respiratory failure, PE, GGO on bipap 4/11 Back on heated high flow overnight, more short of breath today morning 4/12 H HFNC remains at 70% FiO2 35 L,  patient reports improvement in dyspnea this a.m. 4/15 Improved oxygen requirement to 6-8L  Interim History / Subjective:  Improved O2 requirement Objective   Blood pressure 128/60, pulse 78, temperature (!) 97.3 F (36.3 C), temperature source Oral, resp. rate (!) 25, height 5\' 5"  (1.651 m), weight 70.7 kg, SpO2 95%.    FiO2 (%):  [50 %-62 %] 50 %   Intake/Output Summary (Last 24 hours) at 09/19/2023 1312 Last data filed at 09/19/2023 0513 Gross per 24 hour  Intake --  Output 800 ml  Net -800 ml    Filed Weights   09/17/23 0545 09/18/23 0831 09/19/23 0500  Weight: 71.5 kg 68.1 kg 70.7 kg   Physical Exam: General: Well-appearing, no acute distress HENT: Blanket, AT Eyes: EOMI, no scleral icterus Respiratory: Diminished with fine inspiratory crackles bilaterally.  No crackles, wheezing or rales Cardiovascular: RRR, -M/R/G, no JVD Extremities:-Edema,-tenderness Neuro: AAO x4, CNII-XII grossly intact Psych: Normal mood, normal affect  4/14 WBC improved to 11 CRP improved to 1.1     Resolved Hospital Problem list    Assessment & Plan:  Recurrent hypoxic respiratory failure with increased bilateral GGO opacities, L> R - recent sed rate 68 on 4/1. Improving Patulous esophagus on chest CT with calcified node in subcarinal and left hilar c/w old granulomatous disease - recent tx rhinovirus, RLL strep pna (completed 4/2), and steroids (completed 4/5) - ddx include infectious vs inflammatory/ underlying lung process vs edema component.  Possible ongoing aspiration component as well.  Worsening leukocytosis but recently completed steroids, remains afebrile and  PCT low.  PE not significant enough to be contributing to WOB/ hypoxia. She may have underlying interstitial lung disease.  CTD serologies are pending Hx of COPD P:  Wean HFNC for goal >88% Decrease solumedrol to 60 mg daily starting tomorrow. Will plan to decrease by 10 mg weekly Completed 7 days of Zosyn Agree holding on  diuresis Encourage pulmonary hygiene Mobilize as above Continue triple nebs therapy and as needed DuoNebs Mucinex  Concern for aspiration P: SLP following, appreciate assistance  Segmental left PE LLE dvt - L sided, mild clot burden, no evidence of heart strain on CT P: Continue Eliquis  HFpEF HTN HLD Alzheimer's Macrocytic anemia P: -per primary  Best Practice (right click and "Reselect all SmartList Selections" daily)   Per primary  Signature:   Care Time: 36 min  Genetta Kenning, M.D. [facility] Pulmonary/Critical Care Medicine 09/19/2023 1:12 PM   See Amion for personal pager For hours between 7 PM to 7 AM, please call Elink for urgent questions

## 2023-09-19 NOTE — Plan of Care (Signed)

## 2023-09-19 NOTE — Progress Notes (Signed)
 Mobility Specialist Progress Note:    09/19/23 1634  Mobility  Activity Transferred from bed to chair  Level of Assistance Contact guard assist, steadying assist  Assistive Device Front wheel walker  Distance Ambulated (ft) 5 ft  Activity Response Tolerated well  Mobility Referral Yes  Mobility visit 1 Mobility  Mobility Specialist Start Time (ACUTE ONLY) 1420  Mobility Specialist Stop Time (ACUTE ONLY) 1438  Mobility Specialist Time Calculation (min) (ACUTE ONLY) 18 min   Pt received in chair requesting assistance getting back to bed. Assisted donning RLE prosthesis. Was able to stand and transfer to the chair w/ no physical assistance. Upon reaching chair SPO2 dropped to 85% on 6L/min. W/ laying down and PLB SPO2 went back to 90%. Call bell in reach. All needs met. RN in room.  Inetta Manes Mobility Specialist  Please contact vis Secure Chat or  Rehab Office [phone]

## 2023-09-19 NOTE — Telephone Encounter (Signed)
 Sent to the clinic pool not at this office

## 2023-09-19 NOTE — Progress Notes (Signed)
 PROGRESS NOTE        PATIENT DETAILS Name: Shelly [name] Age: 73 y.o. Sex: female Date of Birth: 1951/05/07 Admit Date: 09/11/2023 Admitting Physician Lorin Glass, MD NWG:NFAOZ, Sonny Masters, MD  Brief Summary: Patient is a 73 y.o.  female with history of dementia, right BKA, HTN, HLD, COPD-who was hospitalized from 3/26-4/2 for acute hypoxic respiratory failure secondary to rhinovirus/strep pneumonia infection-patient was stabilized-discharged on home oxygen-presented to the ED on 4/7 with worsening shortness of breath-found to be severely hypoxemic-CTA chest showed a small PE-she was started on heated high flow and subsequently admitted by the hospitalist service but due to worsening hypoxemia-she was transferred to the ICU for close monitoring.  She was stabilized and transferred back to [facility] on 4/10.  Significant events: 3/26-4/2>> hospitalization for hypoxia-rhinovirus/strep pneumo antigen positive-discharged on home O2 4/7>> worsening shortness of breath-worsening hypoxemia-admit to [facility] but later transferred to ICU 4/10>> transferred to [facility] 4/11>> O2 saturation in the 80s on 15 L of HFNC-CXR unchanged-switched to heated high flow-IV Lasix started-prednisone switched to Solu-Medrol. 4/14>> switch to salter high flow 4/15>> down to 5 L [place] high flow  Significant studies: 4/7>> CTA chest: Few scattered segmental PE medial left upper lobe/middle lobe-changing parenchymal lung opacities with areas of interstitial septal thickening and groundglass opacities. 4/8>> echo: EF 65-70% 4/8>> B/L lower extremity Doppler: Age indeterminate DVT left peroneal vein  Significant microbiology data: 4/7>> COVID/influenza/RSV PCR: Negative 4/7>> sputum culture: Pending 4/7>> blood culture: No growth  Procedures: None  Consults: PCCM  Subjective: Feels much better-Down to 5 L of salter high flow.  Objective: Vitals: Blood pressure 128/60, pulse 88, temperature  (!) 97.3 F (36.3 C), temperature source Oral, resp. rate (!) 37, height 5\' 5"  (1.651 m), weight 70.7 kg, SpO2 (!) 89%.   Exam: Gen Exam:Alert awake-not in any distress HEENT:atraumatic, normocephalic Chest: B/L clear to auscultation anteriorly CVS:S1S2 regular Abdomen:soft non tender, non distended Extremities:no edema-right BKA. Neurology: Non focal Skin: no rash  Pertinent Labs/Radiology:    Latest Ref Rng & Units 09/18/2023    4:32 AM 09/17/2023    7:28 AM 09/16/2023    6:02 AM  CBC  WBC 4.0 - 10.5 K/uL 11.9  14.5  11.9   Hemoglobin 12.0 - 15.0 g/dL 9.7  9.4  9.4   Hematocrit 36.0 - 46.0 % 30.5  28.9  29.4   Platelets 150 - 400 K/uL 378  380  356     Lab Results  Component Value Date   NA 140 09/19/2023   K 4.1 09/19/2023   CL 101 09/19/2023   CO2 32 09/19/2023     Assessment/Plan: Acute hypoxic respiratory failure secondary to recent rhinovirus/strep pneumoniae-possible interstitial lung disease related to recent pneumonia-and now small PE Much better-was able to be transitioned to salter high flow 4/14-FiO2 requirements have come down significantly-Down to 5 L of HFNC this morning Will discuss with PCCM-plan on a slow steroid taper Volume status stable-does not require Lasix (developed hypotension on 4/12 after IV Lasix) Completed 7 days of empiric IV Zosyn on 4/15 Encourage use of I-S/flutter valve Mobilize as much as possible Continue to titrate down FiO2-will require reassessment-May require more home oxygen then previously discharged on.  Segmental left-sided pulmonary embolism with left peroneal vein DVT Continue Eliquis  Normocytic anemia Likely due to critical illness No evidence of blood loss Folate/vitamin B12 level  stable. Follow CBC  Chronic HFpEF Euvolemic IV Lasix as needed to maintain negative balance  COPD Not in exacerbation Continue bronchodilators  HLD Statin  HTN BP stable-losartan remains on hold  Mood disorder Stable Continue  Wellbutrin/Prozac  Dementia Delirium precautions Aricept  History of right BKA-uses prosthesis  Code status:   Code Status: Full Code   DVT Prophylaxis: apixaban (ELIQUIS) tablet 10 mg  apixaban (ELIQUIS) tablet 5 mg    Family Communication: Daughter-Marissa-[phone]-updated 4/15  Disposition Plan: Status is: Inpatient Remains inpatient appropriate because: Severity of illness   Planned Discharge Destination:Home   Diet: Diet Order             DIET DYS 3 Room service appropriate? Yes with Assist; Fluid consistency: Thin  Diet effective now                     Antimicrobial agents: Anti-infectives (From admission, onward)    Start     Dose/Rate Route Frequency Ordered Stop   09/16/23 1600  piperacillin-tazobactam (ZOSYN) IVPB 3.375 g       Placed in "Followed by" Linked Group   3.375 g 12.5 mL/hr over 240 Minutes Intravenous Every 8 hours 09/16/23 1144 09/19/23 1559   09/11/23 2200  vancomycin (VANCOREADY) IVPB 750 mg/150 mL  Status:  Discontinued        750 mg 150 mL/hr over 60 Minutes Intravenous Every 12 hours 09/11/23 0926 09/12/23 0958   09/11/23 2200  piperacillin-tazobactam (ZOSYN) IVPB 3.375 g  Status:  Discontinued       Placed in "Followed by" Linked Group   3.375 g 12.5 mL/hr over 240 Minutes Intravenous Every 8 hours 09/11/23 1549 09/16/23 1144   09/11/23 1600  azithromycin (ZITHROMAX) 500 mg in sodium chloride 0.9 % 250 mL IVPB        500 mg 250 mL/hr over 60 Minutes Intravenous Every 24 hours 09/11/23 1547 09/14/23 0857   09/11/23 1600  piperacillin-tazobactam (ZOSYN) IVPB 3.375 g       Placed in "Followed by" Linked Group   3.375 g 100 mL/hr over 30 Minutes Intravenous  Once 09/11/23 1549 09/11/23 2000   09/11/23 1200  ceFEPIme (MAXIPIME) 2 g in sodium chloride 0.9 % 100 mL IVPB  Status:  Discontinued        2 g 200 mL/hr over 30 Minutes Intravenous Every 8 hours 09/11/23 0926 09/11/23 1549   09/11/23 0615  vancomycin (VANCOCIN) IVPB  1000 mg/200 mL premix  Status:  Discontinued        1,000 mg 200 mL/hr over 60 Minutes Intravenous  Once 09/11/23 0603 09/11/23 0605   09/11/23 0615  ceFEPIme (MAXIPIME) 2 g in sodium chloride 0.9 % 100 mL IVPB        2 g 200 mL/hr over 30 Minutes Intravenous  Once 09/11/23 0603 09/11/23 0706   09/11/23 0615  vancomycin (VANCOREADY) IVPB 1500 mg/300 mL        1,500 mg 150 mL/hr over 120 Minutes Intravenous  Once 09/11/23 0605 09/11/23 0920        MEDICATIONS: Scheduled Meds:  apixaban  10 mg Oral BID   Followed by   Melene Muller ON 09/20/2023] apixaban  5 mg Oral BID   arformoterol  15 mcg Nebulization BID   budesonide (PULMICORT) nebulizer solution  0.5 mg Nebulization BID   buPROPion  300 mg Oral Daily   Chlorhexidine Gluconate Cloth  6 each Topical Q0600   donepezil  10 mg Oral QHS   FLUoxetine  20  mg Oral Daily   guaiFENesin  600 mg Oral BID   magic mouthwash w/lidocaine  10 mL Oral TID AC & HS   melatonin  3 mg Oral QHS   methylPREDNISolone (SOLU-MEDROL) injection  60 mg Intravenous Q12H   montelukast  10 mg Oral QHS   pantoprazole  40 mg Oral Daily   revefenacin  175 mcg Nebulization Daily   rosuvastatin  10 mg Oral QHS   sodium chloride flush  3 mL Intravenous Q12H   Continuous Infusions:  piperacillin-tazobactam (ZOSYN)  IV 3.375 g (09/19/23 0935)   PRN Meds:.acetaminophen **OR** acetaminophen, benzocaine, ipratropium-albuterol, ondansetron **OR** ondansetron (ZOFRAN) IV   I have personally reviewed following labs and imaging studies  LABORATORY DATA: CBC: Recent Labs  Lab 09/14/23 0440 09/15/23 0551 09/16/23 0602 09/17/23 0728 09/18/23 0432  WBC 11.9* 11.1* 11.9* 14.5* 11.9*  HGB 8.6* 9.2* 9.4* 9.4* 9.7*  HCT 26.9* 28.9* 29.4* 28.9* 30.5*  MCV 97.5 97.3 95.8 95.7 96.8  PLT 338 330 356 380 378    Basic Metabolic Panel: Recent Labs  Lab 09/15/23 0551 09/16/23 0602 09/17/23 0728 09/18/23 0432 09/19/23 0435  NA 139 138 137 139 140  K 3.9 3.6 3.6 4.4  4.1  CL 98 95* 95* 99 101  CO2 30 31 33* 32 32  GLUCOSE 78 122* 130* 143* 127*  BUN 19 16 16 17 16   CREATININE 0.64 0.62 0.60 0.72 0.65  CALCIUM 8.7* 8.7* 8.6* 8.7* 8.6*    GFR: Estimated Creatinine Clearance: 60.9 mL/min (by C-G formula based on SCr of 0.65 mg/dL).  Liver Function Tests: No results for input(s): "AST", "ALT", "ALKPHOS", "BILITOT", "PROT", "ALBUMIN" in the last 168 hours.  No results for input(s): "LIPASE", "AMYLASE" in the last 168 hours. No results for input(s): "AMMONIA" in the last 168 hours.  Coagulation Profile: No results for input(s): "INR", "PROTIME" in the last 168 hours.   Cardiac Enzymes: No results for input(s): "CKTOTAL", "CKMB", "CKMBINDEX", "TROPONINI" in the last 168 hours.  BNP (last 3 results) No results for input(s): "PROBNP" in the last 8760 hours.  Lipid Profile: No results for input(s): "CHOL", "HDL", "LDLCALC", "TRIG", "CHOLHDL", "LDLDIRECT" in the last 72 hours.  Thyroid Function Tests: No results for input(s): "TSH", "T4TOTAL", "FREET4", "T3FREE", "THYROIDAB" in the last 72 hours.  Anemia Panel: No results for input(s): "VITAMINB12", "FOLATE", "FERRITIN", "TIBC", "IRON", "RETICCTPCT" in the last 72 hours.   Urine analysis:    Component Value Date/Time   COLORURINE YELLOW 09/11/2023 0812   APPEARANCEUR HAZY (A) 09/11/2023 0812   LABSPEC 1.018 09/11/2023 0812   PHURINE 5.0 09/11/2023 0812   GLUCOSEU NEGATIVE 09/11/2023 0812   GLUCOSEU NEGATIVE 09/08/2010 1617   HGBUR NEGATIVE 09/11/2023 0812   BILIRUBINUR NEGATIVE 09/11/2023 0812   KETONESUR 20 (A) 09/11/2023 0812   PROTEINUR NEGATIVE 09/11/2023 0812   UROBILINOGEN 0.2 09/08/2010 1617   NITRITE NEGATIVE 09/11/2023 0812   LEUKOCYTESUR NEGATIVE 09/11/2023 0812    Sepsis Labs: Lactic Acid, Venous    Component Value Date/Time   LATICACIDVEN 1.0 09/12/2023 0511    MICROBIOLOGY: Recent Results (from the past 240 hours)  Blood Culture (routine x 2)     Status: None    Collection Time: 09/11/23  5:39 AM   Specimen: BLOOD  Result Value Ref Range Status   Specimen Description BLOOD BLOOD LEFT ARM  Final   Special Requests   Final    BOTTLES DRAWN AEROBIC AND ANAEROBIC Blood Culture results may not be optimal due to an inadequate volume of  blood received in culture bottles   Culture   Final    NO GROWTH 5 DAYS Performed at [facility], [address] [place], [place] [address]    Report Status 09/16/2023 FINAL  Final  Resp panel by RT-PCR (RSV, Flu A&B, Covid) Anterior Nasal Swab     Status: None   Collection Time: 09/11/23  5:41 AM   Specimen: Anterior Nasal Swab  Result Value Ref Range Status   SARS Coronavirus 2 by RT PCR NEGATIVE NEGATIVE Final   Influenza A by PCR NEGATIVE NEGATIVE Final   Influenza B by PCR NEGATIVE NEGATIVE Final    Comment: (NOTE) The Xpert Xpress SARS-CoV-2/FLU/RSV plus assay is intended as an aid in the diagnosis of influenza from Nasopharyngeal swab specimens and should not be used as a sole basis for treatment. Nasal washings and aspirates are unacceptable for Xpert Xpress SARS-CoV-2/FLU/RSV testing.  Fact Sheet for Patients: BloggerCourse.com  Fact Sheet for Healthcare Providers: SeriousBroker.it  This test is not yet approved or cleared by the [place]  FDA and has been authorized for detection and/or diagnosis of SARS-CoV-2 by FDA under an Emergency Use Authorization (EUA). This EUA will remain in effect (meaning this test can be used) for the duration of the COVID-19 declaration under Section 564(b)(1) of the Act, 21 U.S.C. section 360bbb-3(b)(1), unless the authorization is terminated or revoked.     Resp Syncytial Virus by PCR NEGATIVE NEGATIVE Final    Comment: (NOTE) Fact Sheet for Patients: BloggerCourse.com  Fact Sheet for Healthcare Providers: SeriousBroker.it  This test is not yet  approved or cleared by the [place]  FDA and has been authorized for detection and/or diagnosis of SARS-CoV-2 by FDA under an Emergency Use Authorization (EUA). This EUA will remain in effect (meaning this test can be used) for the duration of the COVID-19 declaration under Section 564(b)(1) of the Act, 21 U.S.C. section 360bbb-3(b)(1), unless the authorization is terminated or revoked.  Performed at [facility], [address] [place], [place] [address]   Blood Culture (routine x 2)     Status: None   Collection Time: 09/11/23  6:06 AM   Specimen: BLOOD  Result Value Ref Range Status   Specimen Description BLOOD BLOOD LEFT HAND  Final   Special Requests   Final    BOTTLES DRAWN AEROBIC AND ANAEROBIC Blood Culture adequate volume   Culture   Final    NO GROWTH 5 DAYS Performed at [facility], [address] [place], [place] [address]    Report Status 09/16/2023 FINAL  Final  Expectorated Sputum Assessment w Gram Stain, Rflx to Resp Cult     Status: None   Collection Time: 09/11/23  9:48 AM   Specimen: Sputum  Result Value Ref Range Status   Specimen Description SPUTUM  Final   Special Requests Normal  Final   Sputum evaluation   Final    THIS SPECIMEN IS ACCEPTABLE FOR SPUTUM CULTURE Performed at [facility], [address], [place], [place] [address]    Report Status 09/13/2023 FINAL  Final  Culture, Respiratory w Gram Stain     Status: None   Collection Time: 09/11/23  9:48 AM   Specimen: SPU  Result Value Ref Range Status   Specimen Description SPUTUM  Final   Special Requests Normal Reflexed from [id number]  Final   Gram Stain   Final    NO WBC SEEN RARE BUDDING YEAST SEEN Performed at [facility],  [address], [place], [place] [address]    Culture   Final    FEW KLEBSIELLA PNEUMONIAE ABUNDANT CANDIDA ALBICANS    Report Status 09/16/2023 FINAL  Final   Organism ID, Bacteria KLEBSIELLA PNEUMONIAE  Final      Susceptibility    Klebsiella pneumoniae - MIC*    AMPICILLIN RESISTANT Resistant     CEFEPIME <=0.12 SENSITIVE Sensitive     CEFTAZIDIME <=1 SENSITIVE Sensitive     CEFTRIAXONE <=0.25 SENSITIVE Sensitive     CIPROFLOXACIN <=0.25 SENSITIVE Sensitive     GENTAMICIN <=1 SENSITIVE Sensitive     IMIPENEM <=0.25 SENSITIVE Sensitive     TRIMETH/SULFA <=20 SENSITIVE Sensitive     AMPICILLIN/SULBACTAM 4 SENSITIVE Sensitive     PIP/TAZO <=4 SENSITIVE Sensitive ug/mL    * FEW KLEBSIELLA PNEUMONIAE  MRSA Next Gen by PCR, Nasal     Status: None   Collection Time: 09/11/23  5:39 PM   Specimen: Nasal Mucosa; Nasal Swab  Result Value Ref Range Status   MRSA by PCR Next Gen NOT DETECTED NOT DETECTED Final    Comment: (NOTE) The GeneXpert MRSA Assay (FDA approved for NASAL specimens only), is one component of a comprehensive MRSA colonization surveillance program. It is not intended to diagnose MRSA infection nor to guide or monitor treatment for MRSA infections. Test performance is not FDA approved in patients less than 75 years old. Performed at [facility], [address], [place], [place] [address]     RADIOLOGY STUDIES/RESULTS: No results found.    LOS: 8 days   Kimberly Penna, MD  [facility]    To contact the attending provider between 7A-7P or the covering provider during after hours 7P-7A, please log into the web site [email or web address] and access using universal [place] password for that web site. If you do not have the password, please call the hospital operator.  09/19/2023, 10:14 AM

## 2023-09-20 ENCOUNTER — Encounter: Admitting: Physical Therapy

## 2023-09-20 DIAGNOSIS — I2699 Other pulmonary embolism without acute cor pulmonale: Secondary | ICD-10-CM | POA: Diagnosis not present

## 2023-09-20 DIAGNOSIS — J189 Pneumonia, unspecified organism: Secondary | ICD-10-CM | POA: Diagnosis not present

## 2023-09-20 DIAGNOSIS — I5189 Other ill-defined heart diseases: Secondary | ICD-10-CM | POA: Diagnosis not present

## 2023-09-20 DIAGNOSIS — I2609 Other pulmonary embolism with acute cor pulmonale: Secondary | ICD-10-CM | POA: Diagnosis not present

## 2023-09-20 DIAGNOSIS — J9601 Acute respiratory failure with hypoxia: Secondary | ICD-10-CM | POA: Diagnosis not present

## 2023-09-20 LAB — BASIC METABOLIC PANEL WITH GFR
Anion gap: 7 (ref 5–15)
BUN: 18 mg/dL (ref 8–23)
CO2: 29 mmol/L (ref 22–32)
Calcium: 8.3 mg/dL — ABNORMAL LOW (ref 8.9–10.3)
Chloride: 103 mmol/L (ref 98–111)
Creatinine, Ser: 0.59 mg/dL (ref 0.44–1.00)
GFR, Estimated: >60 mL/min (ref >60)
Glucose, Bld: 102 mg/dL — ABNORMAL HIGH (ref 70–99)
Potassium: 4 mmol/L (ref 3.5–5.1)
Sodium: 139 mmol/L (ref 135–145)

## 2023-09-20 NOTE — Progress Notes (Signed)
 PROGRESS NOTE        PATIENT DETAILS Name: Shelly [name] Age: 73 y.o. Sex: female Date of Birth: [birth year] Admit Date: 09/11/2023 Admitting Physician Lorin Glass, MD ZOX:[id number], Sonny Masters, MD  Brief Summary: Patient is a 73 y.o.  female with history of dementia, right BKA, HTN, HLD, COPD-who was hospitalized from 3/26-4/2 for acute hypoxic respiratory failure secondary to rhinovirus/strep pneumonia infection-patient was stabilized-discharged on home oxygen-presented to the ED on 4/7 with worsening shortness of breath-found to be severely hypoxemic-CTA chest showed a small PE-she was started on heated high flow and subsequently admitted by the hospitalist service but due to worsening hypoxemia-she was transferred to the ICU for close monitoring.  She was stabilized and transferred back to [facility] on 4/10.  Significant events: 3/26-4/2>> hospitalization for hypoxia-rhinovirus/strep pneumo antigen positive-discharged on home O2 4/7>> worsening shortness of breath-worsening hypoxemia-admit to [facility] but later transferred to ICU 4/10>> transferred to [facility] 4/11>> O2 saturation in the 80s on 15 L of HFNC-CXR unchanged-switched to heated high flow-IV Lasix started-prednisone switched to Solu-Medrol. 4/14>> switch to salter high flow 4/15>> down to 5 L salter high flow  Significant studies: 4/7>> CTA chest: Few scattered segmental PE medial left upper lobe/middle lobe-changing parenchymal lung opacities with areas of interstitial septal thickening and groundglass opacities. 4/8>> echo: EF 65-70% 4/8>> B/L lower extremity Doppler: Age indeterminate DVT left peroneal vein  Significant microbiology data: 4/7>> COVID/influenza/RSV PCR: Negative 4/7>> sputum culture: Pending 4/7>> blood culture: No growth  Procedures: None  Consults: PCCM  Subjective: No major issues overnight-gets short of breath with movement but stable at rest-on 5 L of oxygen at  rest.  Objective: Vitals: Blood pressure (!) 110/55, pulse 82, temperature 98.2 F (36.8 C), temperature source Oral, resp. rate (!) 21, height 5\' 5"  (1.651 m), weight 70.7 kg, SpO2 90%.   Exam: Gen Exam:Alert awake-not in any distress HEENT:atraumatic, normocephalic Chest: B/L clear to auscultation anteriorly CVS:S1S2 regular Abdomen:soft non tender, non distended Extremities: Right BKA. Neurology: Non focal Skin: no rash  Pertinent Labs/Radiology:    Latest Ref Rng & Units 09/18/2023    4:32 AM 09/17/2023    7:28 AM 09/16/2023    6:02 AM  CBC  WBC 4.0 - 10.5 K/uL 11.9  14.5  11.9   Hemoglobin 12.0 - 15.0 g/dL 9.7  9.4  9.4   Hematocrit 36.0 - 46.0 % 30.5  28.9  29.4   Platelets 150 - 400 K/uL 378  380  356     Lab Results  Component Value Date   NA 139 09/20/2023   K 4.0 09/20/2023   CL 103 09/20/2023   CO2 29 09/20/2023     Assessment/Plan: Acute hypoxic respiratory failure secondary to recent rhinovirus/strep pneumoniae-possible interstitial lung disease related to recent pneumonia-and now small PE Slowly improving-stable on 5 L of salter high flow Completed IV Zosyn x 7 days on 4/15 Solu-Medrol changed to daily dosing by PCCM Volume status stable-does not require diuretics today Continue use of IS/flutter valve. Slowly titrate oxygen as tolerated further Will need reassessment of home O2 requirement on discharge.  Segmental left-sided pulmonary embolism with left peroneal vein DVT Continue Eliquis  Normocytic anemia Likely due to critical illness No evidence of blood loss Folate/vitamin B12 level stable. Follow CBC  Chronic HFpEF Euvolemic IV Lasix as needed to maintain negative balance  COPD Not in exacerbation  Continue bronchodilators  HLD Statin  HTN BP stable-losartan remains on hold  Mood disorder Stable Continue Wellbutrin/Prozac  Dementia Delirium precautions Aricept  History of right BKA-uses prosthesis  Code status:   Code  Status: Full Code   DVT Prophylaxis: apixaban (ELIQUIS) tablet 5 mg    Family Communication: Daughter-[phone]-updated 4/15  Disposition Plan: Status is: Inpatient Remains inpatient appropriate because: Severity of illness   Planned Discharge Destination:Home   Diet: Diet Order             DIET DYS 3 Room service appropriate? Yes with Assist; Fluid consistency: Thin  Diet effective now                     Antimicrobial agents: Anti-infectives (From admission, onward)    Start     Dose/Rate Route Frequency Ordered Stop   09/16/23 1600  piperacillin-tazobactam (ZOSYN) IVPB 3.375 g       Placed in "Followed by" Linked Group   3.375 g 12.5 mL/hr over 240 Minutes Intravenous Every 8 hours 09/16/23 1144 09/19/23 1442   09/11/23 2200  vancomycin (VANCOREADY) IVPB 750 mg/150 mL  Status:  Discontinued        750 mg 150 mL/hr over 60 Minutes Intravenous Every 12 hours 09/11/23 0926 09/12/23 0958   09/11/23 2200  piperacillin-tazobactam (ZOSYN) IVPB 3.375 g  Status:  Discontinued       Placed in "Followed by" Linked Group   3.375 g 12.5 mL/hr over 240 Minutes Intravenous Every 8 hours 09/11/23 1549 09/16/23 1144   09/11/23 1600  azithromycin (ZITHROMAX) 500 mg in sodium chloride 0.9 % 250 mL IVPB        500 mg 250 mL/hr over 60 Minutes Intravenous Every 24 hours 09/11/23 1547 09/14/23 0857   09/11/23 1600  piperacillin-tazobactam (ZOSYN) IVPB 3.375 g       Placed in "Followed by" Linked Group   3.375 g 100 mL/hr over 30 Minutes Intravenous  Once 09/11/23 1549 09/11/23 2000   09/11/23 1200  ceFEPIme (MAXIPIME) 2 g in sodium chloride 0.9 % 100 mL IVPB  Status:  Discontinued        2 g 200 mL/hr over 30 Minutes Intravenous Every 8 hours 09/11/23 0926 09/11/23 1549   09/11/23 0615  vancomycin (VANCOCIN) IVPB 1000 mg/200 mL premix  Status:  Discontinued        1,000 mg 200 mL/hr over 60 Minutes Intravenous  Once 09/11/23 0603 09/11/23 0605   09/11/23 0615  ceFEPIme  (MAXIPIME) 2 g in sodium chloride 0.9 % 100 mL IVPB        2 g 200 mL/hr over 30 Minutes Intravenous  Once 09/11/23 0603 09/11/23 0706   09/11/23 0615  vancomycin (VANCOREADY) IVPB 1500 mg/300 mL        1,500 mg 150 mL/hr over 120 Minutes Intravenous  Once 09/11/23 0605 09/11/23 0920        MEDICATIONS: Scheduled Meds:  apixaban  5 mg Oral BID   arformoterol  15 mcg Nebulization BID   budesonide (PULMICORT) nebulizer solution  0.5 mg Nebulization BID   buPROPion  300 mg Oral Daily   Chlorhexidine Gluconate Cloth  6 each Topical Q0600   donepezil  10 mg Oral QHS   FLUoxetine  20 mg Oral Daily   guaiFENesin  600 mg Oral BID   magic mouthwash w/lidocaine  10 mL Oral TID AC & HS   melatonin  3 mg Oral QHS   methylPREDNISolone (SOLU-MEDROL) injection  60 mg Intravenous  Daily   montelukast  10 mg Oral QHS   pantoprazole  40 mg Oral Daily   revefenacin  175 mcg Nebulization Daily   rosuvastatin  10 mg Oral QHS   sodium chloride flush  3 mL Intravenous Q12H   Continuous Infusions:   PRN Meds:.acetaminophen **OR** acetaminophen, benzocaine, ipratropium-albuterol, ondansetron **OR** ondansetron (ZOFRAN) IV   I have personally reviewed following labs and imaging studies  LABORATORY DATA: CBC: Recent Labs  Lab 09/14/23 0440 09/15/23 0551 09/16/23 0602 09/17/23 0728 09/18/23 0432  WBC 11.9* 11.1* 11.9* 14.5* 11.9*  HGB 8.6* 9.2* 9.4* 9.4* 9.7*  HCT 26.9* 28.9* 29.4* 28.9* 30.5*  MCV 97.5 97.3 95.8 95.7 96.8  PLT 338 330 356 380 378    Basic Metabolic Panel: Recent Labs  Lab 09/16/23 0602 09/17/23 0728 09/18/23 0432 09/19/23 0435 09/20/23 0433  NA 138 137 139 140 139  K 3.6 3.6 4.4 4.1 4.0  CL 95* 95* 99 101 103  CO2 31 33* 32 32 29  GLUCOSE 122* 130* 143* 127* 102*  BUN 16 16 17 16 18   CREATININE 0.62 0.60 0.72 0.65 0.59  CALCIUM 8.7* 8.6* 8.7* 8.6* 8.3*    GFR: Estimated Creatinine Clearance: 60.9 mL/min (by C-G formula based on SCr of 0.59  mg/dL).  Liver Function Tests: No results for input(s): "AST", "ALT", "ALKPHOS", "BILITOT", "PROT", "ALBUMIN" in the last 168 hours.  No results for input(s): "LIPASE", "AMYLASE" in the last 168 hours. No results for input(s): "AMMONIA" in the last 168 hours.  Coagulation Profile: No results for input(s): "INR", "PROTIME" in the last 168 hours.   Cardiac Enzymes: No results for input(s): "CKTOTAL", "CKMB", "CKMBINDEX", "TROPONINI" in the last 168 hours.  BNP (last 3 results) No results for input(s): "PROBNP" in the last 8760 hours.  Lipid Profile: No results for input(s): "CHOL", "HDL", "LDLCALC", "TRIG", "CHOLHDL", "LDLDIRECT" in the last 72 hours.  Thyroid Function Tests: No results for input(s): "TSH", "T4TOTAL", "FREET4", "T3FREE", "THYROIDAB" in the last 72 hours.  Anemia Panel: No results for input(s): "VITAMINB12", "FOLATE", "FERRITIN", "TIBC", "IRON", "RETICCTPCT" in the last 72 hours.   Urine analysis:    Component Value Date/Time   COLORURINE YELLOW 09/11/2023 0812   APPEARANCEUR HAZY (A) 09/11/2023 0812   LABSPEC 1.018 09/11/2023 0812   PHURINE 5.0 09/11/2023 0812   GLUCOSEU NEGATIVE 09/11/2023 0812   GLUCOSEU NEGATIVE 09/08/2010 1617   HGBUR NEGATIVE 09/11/2023 0812   BILIRUBINUR NEGATIVE 09/11/2023 0812   KETONESUR 20 (A) 09/11/2023 0812   PROTEINUR NEGATIVE 09/11/2023 0812   UROBILINOGEN 0.2 09/08/2010 1617   NITRITE NEGATIVE 09/11/2023 0812   LEUKOCYTESUR NEGATIVE 09/11/2023 0812    Sepsis Labs: Lactic Acid, Venous    Component Value Date/Time   LATICACIDVEN 1.0 09/12/2023 0511    MICROBIOLOGY: Recent Results (from the past 240 hours)  Blood Culture (routine x 2)     Status: None   Collection Time: 09/11/23  5:39 AM   Specimen: BLOOD  Result Value Ref Range Status   Specimen Description BLOOD BLOOD LEFT ARM  Final   Special Requests   Final    BOTTLES DRAWN AEROBIC AND ANAEROBIC Blood Culture results may not be optimal due to an inadequate  volume of blood received in culture bottles   Culture   Final    NO GROWTH 5 DAYS Performed at [facility], [address], [place], [place] [address]    Report Status 09/16/2023 FINAL  Final  Resp panel by RT-PCR (RSV, Flu A&B, Covid) Anterior  Nasal Swab     Status: None   Collection Time: 09/11/23  5:41 AM   Specimen: Anterior Nasal Swab  Result Value Ref Range Status   SARS Coronavirus 2 by RT PCR NEGATIVE NEGATIVE Final   Influenza A by PCR NEGATIVE NEGATIVE Final   Influenza B by PCR NEGATIVE NEGATIVE Final    Comment: (NOTE) The Xpert Xpress SARS-CoV-2/FLU/RSV plus assay is intended as an aid in the diagnosis of influenza from Nasopharyngeal swab specimens and should not be used as a sole basis for treatment. Nasal washings and aspirates are unacceptable for Xpert Xpress SARS-CoV-2/FLU/RSV testing.  Fact Sheet for Patients: BloggerCourse.com  Fact Sheet for Healthcare Providers: SeriousBroker.it  This test is not yet approved or cleared by the [place]  FDA and has been authorized for detection and/or diagnosis of SARS-CoV-2 by FDA under an Emergency Use Authorization (EUA). This EUA will remain in effect (meaning this test can be used) for the duration of the COVID-19 declaration under Section 564(b)(1) of the Act, 21 U.S.C. section 360bbb-3(b)(1), unless the authorization is terminated or revoked.     Resp Syncytial Virus by PCR NEGATIVE NEGATIVE Final    Comment: (NOTE) Fact Sheet for Patients: BloggerCourse.com  Fact Sheet for Healthcare Providers: SeriousBroker.it  This test is not yet approved or cleared by the [place]  FDA and has been authorized for detection and/or diagnosis of SARS-CoV-2 by FDA under an Emergency Use Authorization (EUA). This EUA will remain in effect (meaning this test can be used) for the duration of the COVID-19  declaration under Section 564(b)(1) of the Act, 21 U.S.C. section 360bbb-3(b)(1), unless the authorization is terminated or revoked.  Performed at [facility], [address], [place], [place] [address]   Blood Culture (routine x 2)     Status: None   Collection Time: 09/11/23  6:06 AM   Specimen: BLOOD  Result Value Ref Range Status   Specimen Description BLOOD BLOOD LEFT HAND  Final   Special Requests   Final    BOTTLES DRAWN AEROBIC AND ANAEROBIC Blood Culture adequate volume   Culture   Final    NO GROWTH 5 DAYS Performed at [facility], [address], [place], [place] [address]    Report Status 09/16/2023 FINAL  Final  Expectorated Sputum Assessment w Gram Stain, Rflx to Resp Cult     Status: None   Collection Time: 09/11/23  9:48 AM   Specimen: Sputum  Result Value Ref Range Status   Specimen Description SPUTUM  Final   Special Requests Normal  Final   Sputum evaluation   Final    THIS SPECIMEN IS ACCEPTABLE FOR SPUTUM CULTURE Performed at [facility], [address] [place], [place] [address]    Report Status 09/13/2023 FINAL  Final  Culture, Respiratory w Gram Stain     Status: None   Collection Time: 09/11/23  9:48 AM   Specimen: SPU  Result Value Ref Range Status   Specimen Description SPUTUM  Final   Special Requests Normal Reflexed from [id number]  Final   Gram Stain   Final    NO WBC SEEN RARE BUDDING YEAST SEEN Performed at [facility], [address], [place], [place] [address]    Culture   Final    FEW KLEBSIELLA PNEUMONIAE ABUNDANT CANDIDA ALBICANS    Report Status 09/16/2023 FINAL  Final   Organism ID, Bacteria KLEBSIELLA PNEUMONIAE  Final      Susceptibility   Klebsiella  pneumoniae - MIC*    AMPICILLIN RESISTANT Resistant     CEFEPIME <=0.12 SENSITIVE Sensitive     CEFTAZIDIME <=1 SENSITIVE Sensitive     CEFTRIAXONE <=0.25 SENSITIVE Sensitive     CIPROFLOXACIN <=0.25 SENSITIVE Sensitive     GENTAMICIN <=1 SENSITIVE  Sensitive     IMIPENEM <=0.25 SENSITIVE Sensitive     TRIMETH/SULFA <=20 SENSITIVE Sensitive     AMPICILLIN/SULBACTAM 4 SENSITIVE Sensitive     PIP/TAZO <=4 SENSITIVE Sensitive ug/mL    * FEW KLEBSIELLA PNEUMONIAE  MRSA Next Gen by PCR, Nasal     Status: None   Collection Time: 09/11/23  5:39 PM   Specimen: Nasal Mucosa; Nasal Swab  Result Value Ref Range Status   MRSA by PCR Next Gen NOT DETECTED NOT DETECTED Final    Comment: (NOTE) The GeneXpert MRSA Assay (FDA approved for NASAL specimens only), is one component of a comprehensive MRSA colonization surveillance program. It is not intended to diagnose MRSA infection nor to guide or monitor treatment for MRSA infections. Test performance is not FDA approved in patients less than 92 years old. Performed at [facility], [address] [place], [place] [address]     RADIOLOGY STUDIES/RESULTS: No results found.    LOS: 9 days   Kimberly Penna, MD  [facility]    To contact the attending provider between 7A-7P or the covering provider during after hours 7P-7A, please log into the web site [email or web address] and access using universal [facility] password for that web site. If you do not have the password, please call the hospital operator.  09/20/2023, 10:46 AM

## 2023-09-20 NOTE — Progress Notes (Signed)
 Physical Therapy Treatment Patient Details Name: Shelly [name] MRN: [id number] DOB: 1950-07-15 Today's Date: 09/20/2023   History of Present Illness 73 y.o. female admitted 09/11/23 with hypoxemia and PE. PMhx: Admission 3/26-4/2 with N/V, falls, CAP. Rt BKA, HTN, HLD, mild early Alzheimer's dementia, asthma, fibromyalgia, anxiety.    PT Comments  Pt in bed on arrival. She reported requiring increase from 5L to 9L this morning due to desat/coughing after laughing and having conversation with her friend on the phone. Pt sats at 92% on arrival. She required supervision bed mobility, and min assist transfer bed to recliner. Increased time required for all mobility due to continual desat with all activity, primarily extended conversation with resulting in coughing. Pt able to don RLE prosthesis independently sitting EOB. After transferring to recliner, SpO2 in mid to upper 70s. Provided pt with ice chips and encouraged relaxation, pursed lip breathing, and limited conversation. After 5-7 minutes, pt recovered to 90% (which required brief increase to 11L). Pt in recliner at end of session with feet elevated.     If plan is discharge home, recommend the following: Assist for transportation;Help with stairs or ramp for entrance;Assistance with cooking/housework   Can travel by private vehicle        Equipment Recommendations  None recommended by PT    Recommendations for Other Services       Precautions / Restrictions Precautions Precautions: Fall Recall of Precautions/Restrictions: Intact Precaution/Restrictions Comments: watch SpO2, HFNC     Mobility  Bed Mobility Overal bed mobility: Needs Assistance Bed Mobility: Supine to Sit     Supine to sit: Supervision, HOB elevated, Used rails          Transfers Overall transfer level: Needs assistance Equipment used: 1 person hand held assist Transfers: Sit to/from Stand, Bed to chair/wheelchair/BSC Sit to Stand: Min assist Stand  pivot transfers: Min assist         General transfer comment: assist to maintain balance and pivot. Increased assist today due to fatigue, resp distress.    Ambulation/Gait               General Gait Details: deferred due to resp status   Stairs             Wheelchair Mobility     Tilt Bed    Modified Rankin (Stroke Patients Only)       Balance Overall balance assessment: Needs assistance Sitting-balance support: No upper extremity supported, Feet supported Sitting balance-Leahy Scale: Good Sitting balance - Comments: Pt able to don RLE prosthesis independently EOB.   Standing balance support: During functional activity, Single extremity supported Standing balance-Leahy Scale: Poor                              Communication Communication Communication: No apparent difficulties  Cognition Arousal: Alert Behavior During Therapy: WFL for tasks assessed/performed   PT - Cognitive impairments: History of cognitive impairments                       PT - Cognition Comments: mild early Alzheimer's dementia Following commands: Intact      Cueing Cueing Techniques: Verbal cues  Exercises      General Comments General comments (skin integrity, edema, etc.): SpO2 92% on 9L on arrival. Desat into the 70s during mobility. 5-7 minute recovery time to return to 90%, with brief increase to 11L.      Pertinent Vitals/Pain Pain  Assessment Pain Assessment: No/denies pain    Home Living                          Prior Function            PT Goals (current goals can now be found in the care plan section) Acute Rehab PT Goals Patient Stated Goal: return home with plans to transition to [facility] soon Progress towards PT goals: Progressing toward goals    Frequency    Min 2X/week      PT Plan      Co-evaluation              AM-PAC PT "6 Clicks" Mobility   Outcome Measure  Help needed  turning from your back to your side while in a flat bed without using bedrails?: None Help needed moving from lying on your back to sitting on the side of a flat bed without using bedrails?: A Little Help needed moving to and from a bed to a chair (including a wheelchair)?: A Little Help needed standing up from a chair using your arms (e.g., wheelchair or bedside chair)?: A Little Help needed to walk in hospital room?: A Little Help needed climbing 3-5 steps with a railing? : Total 6 Click Score: 17    End of Session Equipment Utilized During Treatment: Oxygen Activity Tolerance: Treatment limited secondary to medical complications (Comment) (desat with minimal activity) Patient left: in chair;with call bell/phone within reach;with chair alarm set Nurse Communication: Mobility status PT Visit Diagnosis: Other abnormalities of gait and mobility (R26.89);Muscle weakness (generalized) (M62.81)     Time: 1610-9604 PT Time Calculation (min) (ACUTE ONLY): 24 min  Charges:    $Therapeutic Activity: 23-37 mins PT General Charges $$ ACUTE PT VISIT: 1 Visit                     Dorothye Gathers., PT  Office # [phone]    Shelly [name] 09/20/2023, 11:39 AM

## 2023-09-20 NOTE — Plan of Care (Signed)
  Problem: Education: Goal: Knowledge of General Education information will improve Description: Including pain rating scale, medication(s)/side effects and non-pharmacologic comfort measures 09/20/2023 0713 by Mileigh Tilley, Arvel Lather, RN Outcome: Progressing 09/20/2023 0713 by Merelyn Klump, Arvel Lather, RN Outcome: Progressing   Problem: Health Behavior/Discharge Planning: Goal: Ability to manage health-related needs will improve 09/20/2023 0713 by Izayiah Tibbitts, Arvel Lather, RN Outcome: Progressing 09/20/2023 0713 by Daune Divirgilio, Arvel Lather, RN Outcome: Progressing   Problem: Clinical Measurements: Goal: Ability to maintain clinical measurements within normal limits will improve 09/20/2023 0713 by Maycel Riffe, Arvel Lather, RN Outcome: Progressing 09/20/2023 0713 by Shevawn Langenberg, Arvel Lather, RN Outcome: Progressing Goal: Will remain free from infection 09/20/2023 0713 by Jhayden Demuro, Arvel Lather, RN Outcome: Progressing 09/20/2023 0713 by Nas Wafer, Arvel Lather, RN Outcome: Progressing Goal: Diagnostic test results will improve 09/20/2023 0713 by Kearston Putman, Arvel Lather, RN Outcome: Progressing 09/20/2023 0713 by Meggan Dhaliwal, Arvel Lather, RN Outcome: Progressing Goal: Respiratory complications will improve 09/20/2023 0713 by Banks Chaikin, Arvel Lather, RN Outcome: Progressing 09/20/2023 0713 by Bralynn Donado, Arvel Lather, RN Outcome: Progressing Goal: Cardiovascular complication will be avoided 09/20/2023 0713 by Lorah Kalina, Arvel Lather, RN Outcome: Progressing 09/20/2023 0713 by Stephine Langbehn, Arvel Lather, RN Outcome: Progressing   Problem: Activity: Goal: Risk for activity intolerance will decrease 09/20/2023 0713 by Dondi Burandt, Arvel Lather, RN Outcome: Progressing 09/20/2023 0713 by Lavonne Cass, Arvel Lather, RN Outcome: Progressing   Problem: Nutrition: Goal: Adequate nutrition will be maintained 09/20/2023 0713 by Leilanie Rauda, Arvel Lather, RN Outcome: Progressing 09/20/2023 0713 by Zaden Sako, Arvel Lather, RN Outcome: Progressing   Problem: Coping: Goal: Level  of anxiety will decrease 09/20/2023 0713 by Burdette Forehand, Arvel Lather, RN Outcome: Progressing 09/20/2023 0713 by Zariah Cavendish, Arvel Lather, RN Outcome: Progressing   Problem: Elimination: Goal: Will not experience complications related to bowel motility 09/20/2023 0713 by Durelle Zepeda, Arvel Lather, RN Outcome: Progressing 09/20/2023 0713 by Fawzi Melman, Arvel Lather, RN Outcome: Progressing Goal: Will not experience complications related to urinary retention 09/20/2023 0713 by Aireanna Luellen, Arvel Lather, RN Outcome: Progressing 09/20/2023 0713 by Nicholaus Steinke, Arvel Lather, RN Outcome: Progressing   Problem: Pain Managment: Goal: General experience of comfort will improve and/or be controlled 09/20/2023 0713 by Taqwa Deem, Arvel Lather, RN Outcome: Progressing 09/20/2023 0713 by Alto Gandolfo, Arvel Lather, RN Outcome: Progressing   Problem: Safety: Goal: Ability to remain free from injury will improve 09/20/2023 0713 by Makailee Nudelman, Arvel Lather, RN Outcome: Progressing 09/20/2023 0713 by Antonique Langford, Arvel Lather, RN Outcome: Progressing   Problem: Skin Integrity: Goal: Risk for impaired skin integrity will decrease 09/20/2023 0713 by Biance Moncrief, Arvel Lather, RN Outcome: Progressing 09/20/2023 0713 by Nolita Kutter, Arvel Lather, RN Outcome: Progressing

## 2023-09-20 NOTE — Plan of Care (Signed)

## 2023-09-20 NOTE — Progress Notes (Signed)
 NAME:  Shelly [name], MRN:  [id number], DOB:  1951-01-07, LOS: 9 ADMISSION DATE:  09/11/2023, CONSULTATION DATE:  09/11/23 REFERRING MD:  Dr. Katrinka Blazing, CHIEF COMPLAINT:  SOB   History of Present Illness:   71 yoF with PMH of HTN, HLD, PAD, Right [place] artery stenosis, asthma, RLE avascular necrosis s/p BKA, alzheimers dementia, and former smoker presenting from home with recurrent SOB and hypoxia.  Recently hospitalized 3/26- 4/2 for hypoxic respiratory failure felt related to rhinovirus, RLL strep PNA +/- aspiration PNA (given N/V), possible AECOPD.  Discharged home on 3-4L [place] with 3 days left on steroid taper.    Felt initially better but with progressive SOB, found hypoxic in the 60's on HOT w/ EMS improved on HFNC.  No reported recent fevers, increased cough, productive at times, denies any dysphagia, and complains slightly of left calf pain.  Placed on BiPAP for increased WOB in ER, requiring 60%.  Afebrile, low to normotensive BP.  CXR showing multifocal infiltrates.  SARS/ flu/ RSV neg.  BNP 469, trop hs flat x3 in 20-30's.  PCT 0.13, WBC 18.2.  Empiric vancomycin and cefepime started.  TRH called for admit.  Lactic increased 1.4> 2.  CTA chest showed few scattered segmental PE on left, mild clot burden but changing parenchymal lung opacities of interstitial septal thickening GGO, more left sided with several enlarged mediastinal nodules.  Since in ER awaiting bed, unable to tolerate BiPAP wean due to WOB and hypoxia.  PCCM called for further evaluation.   Pertinent  Medical History  HTN, HLD, PAD, Right [place] artery stenosis, asthma, RLE avascular necrosis s/p BKA, alzheimers dementia, former smoker  Significant Hospital Events: Including procedures, antibiotic start and stop dates in addition to other pertinent events   4/7 Admit recurrent hypoxic respiratory failure, PE, GGO on bipap 4/11 Back on heated high flow overnight, more short of breath today morning 4/12 H HFNC remains at 70% FiO2 35 L,  patient reports improvement in dyspnea this a.m. 4/15 Improved oxygen requirement to 6-8L 4/16 Weaned solumedrol to 60 mg daily  Interim History / Subjective:  Improved O2 requirement to 5-6L at rest Requiring up to 9-10 L with exertion Objective   Blood pressure (!) 103/51, pulse 77, temperature 97.6 F (36.4 C), resp. rate (!) 23, height 5\' 5"  (1.651 m), weight 70.7 kg, SpO2 99%.        Intake/Output Summary (Last 24 hours) at 09/20/2023 1306 Last data filed at 09/20/2023 0900 Gross per 24 hour  Intake 120 ml  Output --  Net 120 ml    Filed Weights   09/17/23 0545 09/18/23 0831 09/19/23 0500  Weight: 71.5 kg 68.1 kg 70.7 kg   Physical Exam: General: Well-appearing, no acute distress HENT: [place], AT Eyes: EOMI, no scleral icterus Respiratory: Diminished with inspiratory crackles bilaterally.   Cardiovascular: RRR, -M/R/G, no JVD Extremities:-Edema,-tenderness Neuro: AAO x4, CNII-XII grossly intact Psych: Normal mood, normal affect   4/14 WBC improved to 11 CRP improved to 1.1     Resolved Hospital Problem list    Assessment & Plan:  Recurrent hypoxic respiratory failure with increased bilateral GGO opacities, L> R - recent sed rate 68 on 4/1. Improving Patulous esophagus on chest CT with calcified node in subcarinal and left hilar c/w old granulomatous disease - recent tx rhinovirus, RLL strep pna (completed 4/2), and steroids (completed 4/5) - ddx include infectious vs inflammatory/ underlying lung process vs edema component.  Possible ongoing aspiration component as well.  Worsening leukocytosis but recently  completed steroids, remains afebrile and PCT low.  PE not significant enough to be contributing to WOB/ hypoxia. She may have underlying interstitial lung disease.  CTD serologies are pending Hx of COPD P:  Wean HFNC for goal >88%. O2 requirement slowly improving. Continues to require up to 10L with activity but at rest O2 5-6L. Solumedrol weaned to to 60 mg daily  4/16>. Will plan to decrease by 10 mg weekly Completed 7 days of Zosyn Agree holding on diuresis Encourage pulmonary hygiene Mobilize as above Continue triple nebs therapy and as needed DuoNebs Mucinex  Concern for aspiration P: SLP following, appreciate assistance  Segmental left PE LLE dvt - L sided, mild clot burden, no evidence of heart strain on CT P: Continue Eliquis  HFpEF HTN HLD Alzheimer's Macrocytic anemia P: -per primary  Best Practice (right click and "Reselect all SmartList Selections" daily)   Per primary  Signature:   Care Time: 25 min  Genetta Kenning, M.D. [facility] Pulmonary/Critical Care Medicine 09/20/2023 1:06 PM   See Amion for personal pager For hours between 7 PM to 7 AM, please call Elink for urgent questions

## 2023-09-20 NOTE — Progress Notes (Signed)
   09/20/23 0949  Mobility  Activity  (Chair Exercises)  Level of Assistance Standby assist, set-up cues, supervision of patient - no hands on  Assistive Device None  Range of Motion/Exercises Right leg;Left leg  Activity Response Tolerated fair  Mobility Referral Yes  Mobility visit 1 Mobility  Mobility Specialist Start Time (ACUTE ONLY) 0949  Mobility Specialist Stop Time (ACUTE ONLY) 1002  Mobility Specialist Time Calculation (min) (ACUTE ONLY) 13 min   Mobility Specialist: Progress Note  Pre-Mobility:      HR 83, SpO2 93% 9L During Mobility:              SpO2 91% 9L Post-Mobility:    HR 88, SpO2 95% 9L  Pt agreeable to mobility session - received in chair with footrest up. C/o having a breathing attack this morning after transferring to chair, SPO2 recovery took some time according to pt. Pt states its more difficult to raise RLE d/t heaviness of prosthetic. Returned to sitting upright in chair with feet up with all needs met - call bell within reach.   BLE exercises : heel slides (x10), leg raises (x20).  LLE exercises : ankle pumps (x10) and ankle circles (x10)  Isla Mari, BS Mobility Specialist Please contact via SecureChat or  Rehab office at [phone].

## 2023-09-21 ENCOUNTER — Telehealth (HOSPITAL_BASED_OUTPATIENT_CLINIC_OR_DEPARTMENT_OTHER): Payer: Self-pay | Admitting: Pulmonary Disease

## 2023-09-21 DIAGNOSIS — I2699 Other pulmonary embolism without acute cor pulmonale: Secondary | ICD-10-CM | POA: Diagnosis not present

## 2023-09-21 DIAGNOSIS — I2609 Other pulmonary embolism with acute cor pulmonale: Secondary | ICD-10-CM | POA: Diagnosis not present

## 2023-09-21 DIAGNOSIS — I5189 Other ill-defined heart diseases: Secondary | ICD-10-CM | POA: Diagnosis not present

## 2023-09-21 DIAGNOSIS — J189 Pneumonia, unspecified organism: Secondary | ICD-10-CM | POA: Diagnosis not present

## 2023-09-21 DIAGNOSIS — J9601 Acute respiratory failure with hypoxia: Secondary | ICD-10-CM | POA: Diagnosis not present

## 2023-09-21 NOTE — TOC Progression Note (Signed)
 Transition of Care [facility]) - Progression Note    Patient Details  Name: Shelly [name] MRN: [id number] Date of Birth: 1951-05-09  Transition of Care [facility] CM/SW Contact  Eusebio High, RN Phone Number: 09/21/2023, 11:38 AM  Clinical Narrative:    RNCM met with patient bedside to discuss DC plan. Patient lives alone. She stated her adult children live out of state but are very involved, and that her Daughter, [name] is handling "all things relating to this" . Patient state he has a wheelchair, a knee walker, a tub bench and has a chair lift on her interior staircase. She is currently on Home O2 and unsure of who provider is.  RNCM called Daughter, [name] at [phone].  She had questions regarding resources for her Mother at home as she lives alone. RNCM will email [employer] service company information to daughter at [email or web address].   Daughter states patient is receiving SN services from [place] but she is requesting additional disciplines. PT/OT and an AIDE have been added and order placed.   Daughter states that patient's Brother will most likely be able to transport patient home at DC. I informed Daughter that at this time the EDD is 1-2 days. Daughter would like a call when DC order is placed so she can determine if she will come from her home 4 hours away to also assist her Mother.   TOC will continue to follow patient for any additional discharge needs          Expected Discharge Plan and Services                                   HH Arranged: PT, OT, RN, Disease Management, Nurse's Aide   Date [facility] Agency Contacted: 09/13/23 Time HH Agency Contacted: 1000 Representative spoke with at [facility] Agency: Randel Buss   Social Determinants of Health (SDOH) Interventions SDOH Screenings   Food Insecurity: No Food Insecurity (09/11/2023)  Housing: Low Risk  (09/11/2023)  Transportation Needs: No Transportation Needs (09/11/2023)  Utilities: Not At Risk (09/11/2023)  Social  Connections: Moderately Isolated (09/11/2023)  Tobacco Use: Medium Risk (09/11/2023)    Readmission Risk Interventions    09/12/2023    1:24 PM 09/05/2023    4:03 PM  Readmission Risk Prevention Plan  Transportation Screening Complete Complete  PCP or Specialist Appt within 5-7 Days  Complete  PCP or Specialist Appt within 3-5 Days Complete   Home Care Screening  Complete  Medication Review (RN CM)  Complete  HRI or Home Care Consult Complete   Social Work Consult for Recovery Care Planning/Counseling Complete   Palliative Care Screening Not Applicable   Medication Review Oceanographer) Referral to Pharmacy

## 2023-09-21 NOTE — Plan of Care (Signed)

## 2023-09-21 NOTE — Telephone Encounter (Signed)
 Please schedule patient for follow-up with Dr. Waymond Hailey or NP in 2-4 weeks for hospital follow-up at [facility] location.

## 2023-09-21 NOTE — Progress Notes (Addendum)
 Occupational Therapy Treatment Patient Details Name: Shelly [name] MRN: [id number] DOB: 09-16-1950 Today's Date: 09/21/2023   History of present illness 73 y.o. female admitted 09/11/23 with hypoxemia and PE. PMhx: Admission 3/26-4/2 with N/V, falls, CAP. Rt BKA, HTN, HLD, mild early Alzheimer's dementia, asthma, fibromyalgia, anxiety.   OT comments  Pt remains eager to work with therapies and regain independence. Overall, pt able to manage bed mobility, donning prosthetic LE and brief in-room mobility using RW without physical assistance. However, deficits in cardiopulmonary endurance continue w/ up to 8 L O2 required though desats to 74% still noted. Pt requiring increased recovery time w/ seated rest break and cues for breathing techniques to recover upper 80s w/ gradual titration of O2 in coordination with MD. Provided energy conservation handout w/ plans to further reinforce this education in next session.       If plan is discharge home, recommend the following:  A little help with bathing/dressing/bathroom;Assistance with cooking/housework;Assist for transportation;Help with stairs or ramp for entrance   Equipment Recommendations  None recommended by OT    Recommendations for Other Services      Precautions / Restrictions Precautions Precautions: Fall Recall of Precautions/Restrictions: Intact Precaution/Restrictions Comments: watch SpO2 Restrictions Weight Bearing Restrictions Per Provider Order: No       Mobility Bed Mobility Overal bed mobility: Modified Independent Bed Mobility: Supine to Sit                Transfers Overall transfer level: Needs assistance Equipment used: Rolling walker (2 wheels) Transfers: Sit to/from Stand Sit to Stand: Supervision                 Balance Overall balance assessment: Needs assistance Sitting-balance support: No upper extremity supported, Feet supported Sitting balance-Leahy Scale: Good     Standing balance  support: During functional activity, Single extremity supported Standing balance-Leahy Scale: Poor Standing balance comment: Single UE to steady. RLE prosthesis on.                           ADL either performed or assessed with clinical judgement   ADL Overall ADL's : Needs assistance/impaired Eating/Feeding: Independent;Sitting               Upper Body Dressing : Set up;Sitting   Lower Body Dressing: Set up;Sitting/lateral leans;Sit to/from stand Lower Body Dressing Details (indicate cue type and reason): to don prosthetic LE sitting EOB             Functional mobility during ADLs: Supervision/safety;Rolling walker (2 wheels) General ADL Comments: Emphasis on assessment of recovery time for O2 sats, breathing techniques. provided energy conservation handout and initial education on modifying ADL routine, potential use of wheelchair more to conserve energy, using pulse ox to monitor O2 and establish parameters/self monitoring symptons. Encouraged assist for IADLs/groceries (noted plan to move to [facility] in near future)    Extremity/Trunk Assessment Upper Extremity Assessment Upper Extremity Assessment: Overall WFL for tasks assessed;Right hand dominant   Lower Extremity Assessment Lower Extremity Assessment: Defer to PT evaluation        Vision   Vision Assessment?: No apparent visual deficits   Perception     Praxis     Communication Communication Communication: No apparent difficulties   Cognition Arousal: Alert Behavior During Therapy: WFL for tasks assessed/performed Cognition: No apparent impairments  Following commands: Intact        Cueing   Cueing Techniques: Verbal cues  Exercises      Shoulder Instructions       General Comments MD present at bedside, discussed O2 increase for activity and assessments    Pertinent Vitals/ Pain       Pain Assessment Pain Assessment: No/denies  pain  Home Living                                          Prior Functioning/Environment              Frequency  Min 2X/week        Progress Toward Goals  OT Goals(current goals can now be found in the care plan section)  Progress towards OT goals: Progressing toward goals  Acute Rehab OT Goals Patient Stated Goal: home soon, improve O2 status OT Goal Formulation: With patient Time For Goal Achievement: 09/28/23 Potential to Achieve Goals: Good ADL Goals Pt Will Perform Upper Body Dressing: with modified independence Pt Will Perform Lower Body Dressing: with modified independence Pt Will Transfer to Toilet: with modified independence Pt Will Perform Toileting - Clothing Manipulation and hygiene: with modified independence Additional ADL Goal #1: Pt will be able to tolerate 5 mins of continuous OOB activity while maintaining safety awareness and stable O2 levels to maximize activity tolerance and return to PLOF.  Plan      Co-evaluation                 AM-PAC OT "6 Clicks" Daily Activity     Outcome Measure   Help from another person eating meals?: None Help from another person taking care of personal grooming?: A Little Help from another person toileting, which includes using toliet, bedpan, or urinal?: A Little Help from another person bathing (including washing, rinsing, drying)?: A Little Help from another person to put on and taking off regular upper body clothing?: A Little Help from another person to put on and taking off regular lower body clothing?: A Little 6 Click Score: 19    End of Session Equipment Utilized During Treatment: Rolling walker (2 wheels);Oxygen  OT Visit Diagnosis: Unsteadiness on feet (R26.81);Other abnormalities of gait and mobility (R26.89);Muscle weakness (generalized) (M62.81)   Activity Tolerance Patient tolerated treatment well   Patient Left in chair;with call bell/phone within reach;with chair alarm  set;with family/visitor present   Nurse Communication Other (comment) (Discussed O2 with MD)        Time: 3086-5784 OT Time Calculation (min): 35 min  Charges: OT General Charges $OT Visit: 1 Visit OT Treatments $Self Care/Home Management : 8-22 mins $Therapeutic Activity: 8-22 mins  Lawrence Pretty, OTR/L Acute Rehab Services Office: [phone]   Annabella Barr 09/21/2023, 8:19 AM

## 2023-09-21 NOTE — Progress Notes (Signed)
 NAME:  Shelly [name], MRN:  [id number], DOB:  1950/07/23, LOS: 10 ADMISSION DATE:  09/11/2023, CONSULTATION DATE:  09/11/23 REFERRING MD:  Dr. Katrinka Blazing, CHIEF COMPLAINT:  SOB   History of Present Illness:   30 yoF with PMH of HTN, HLD, PAD, Right [place] artery stenosis, asthma, RLE avascular necrosis s/p BKA, alzheimers dementia, and former smoker presenting from home with recurrent SOB and hypoxia.  Recently hospitalized 3/26- 4/2 for hypoxic respiratory failure felt related to rhinovirus, RLL strep PNA +/- aspiration PNA (given N/V), possible AECOPD.  Discharged home on 3-4L [place] with 3 days left on steroid taper.    Felt initially better but with progressive SOB, found hypoxic in the 60's on HOT w/ EMS improved on HFNC.  No reported recent fevers, increased cough, productive at times, denies any dysphagia, and complains slightly of left calf pain.  Placed on BiPAP for increased WOB in ER, requiring 60%.  Afebrile, low to normotensive BP.  CXR showing multifocal infiltrates.  SARS/ flu/ RSV neg.  BNP 469, trop hs flat x3 in 20-30's.  PCT 0.13, WBC 18.2.  Empiric vancomycin and cefepime started.  TRH called for admit.  Lactic increased 1.4> 2.  CTA chest showed few scattered segmental PE on left, mild clot burden but changing parenchymal lung opacities of interstitial septal thickening GGO, more left sided with several enlarged mediastinal nodules.  Since in ER awaiting bed, unable to tolerate BiPAP wean due to WOB and hypoxia.  PCCM called for further evaluation.   Pertinent  Medical History  HTN, HLD, PAD, Right [place] artery stenosis, asthma, RLE avascular necrosis s/p BKA, alzheimers dementia, former smoker  Significant Hospital Events: Including procedures, antibiotic start and stop dates in addition to other pertinent events   4/7 Admit recurrent hypoxic respiratory failure, PE, GGO on bipap 4/11 Back on heated high flow overnight, more short of breath today morning 4/12 H HFNC remains at 70% FiO2 35 L,  patient reports improvement in dyspnea this a.m. 4/15 Improved oxygen requirement to 6-8L 4/16 Weaned solumedrol to 60 mg daily  Interim History / Subjective:  Improved O2 requirement to 5-6L at rest Requiring up to 9-10 L with exertion Objective   Blood pressure (!) 112/47, pulse 92, temperature 98.5 F (36.9 C), temperature source Oral, resp. rate 20, height 5\' 5"  (1.651 m), weight 70.7 kg, SpO2 97%.        Intake/Output Summary (Last 24 hours) at 09/21/2023 1543 Last data filed at 09/21/2023 1300 Gross per 24 hour  Intake 480 ml  Output --  Net 480 ml    Filed Weights   09/17/23 0545 09/18/23 0831 09/19/23 0500  Weight: 71.5 kg 68.1 kg 70.7 kg   Physical Exam: General: Well-appearing, no acute distress HENT: [place], AT Eyes: EOMI, no scleral icterus Respiratory: Diminished with inspiratory crackles bilaterally.   Cardiovascular: RRR, -M/R/G, no JVD Extremities:-Edema,-tenderness Neuro: AAO x4, CNII-XII grossly intact Psych: Normal mood, normal affect   4/14 WBC improved to 11 CRP improved to 1.1     Resolved Hospital Problem list    Assessment & Plan:  Recurrent hypoxic respiratory failure with increased bilateral GGO opacities, L> R - recent sed rate 68 on 4/1. Improving Patulous esophagus on chest CT with calcified node in subcarinal and left hilar c/w old granulomatous disease - recent tx rhinovirus, RLL strep pna (completed 4/2), and steroids (completed 4/5) - ddx include infectious vs inflammatory/ underlying lung process vs edema component.  Possible ongoing aspiration component as well.  Worsening leukocytosis  but recently completed steroids, remains afebrile and PCT low.  PE not significant enough to be contributing to WOB/ hypoxia. She may have underlying interstitial lung disease.  CTD serologies are pending Hx of COPD P:  Wean HFNC for goal >88%. O2 requirement improving. Weaned to 4L at rest. Will need ambulatory O2 to determine home O2 nedds Solumedrol  weaned to to 60 mg daily 4/16>. Will plan to decrease by 10 mg weekly on PO prednisone Completed 7 days of Zosyn Agree holding on diuresis Encourage pulmonary hygiene Mobilize as above Continue triple nebs therapy and as needed DuoNebs Mucinex  Concern for aspiration P: SLP following, appreciate assistance  Segmental left PE LLE dvt - L sided, mild clot burden, no evidence of heart strain on CT P: Continue Eliquis  HFpEF HTN HLD Alzheimer's Macrocytic anemia P: -per primary  Pulmonary team will sign off. Will arrange follow-up in 2-4 weeks with her primary pulmonologist to follow taper and O2 needs.  Best Practice (right click and "Reselect all SmartList Selections" daily)   Per primary  Signature:   Care Time: 34 min  Genetta Kenning, M.D. [facility] Pulmonary/Critical Care Medicine 09/21/2023 3:43 PM   See Amion for personal pager For hours between 7 PM to 7 AM, please call Elink for urgent questions

## 2023-09-21 NOTE — Progress Notes (Signed)
 Spo2 while walking on 8L was between 83-87.

## 2023-09-21 NOTE — Progress Notes (Signed)
 PROGRESS NOTE        PATIENT DETAILS Name: Shelly [name] Age: 73 y.o. Sex: female Date of Birth: March 06, 1951 Admit Date: 09/11/2023 Admitting Physician Lorin Glass, MD ZOX:[id number], Sonny Masters, MD  Brief Summary: Patient is a 73 y.o.  female with history of dementia, right BKA, HTN, HLD, COPD-who was hospitalized from 3/26-4/2 for acute hypoxic respiratory failure secondary to rhinovirus/strep pneumonia infection-patient was stabilized-discharged on home oxygen-presented to the ED on 4/7 with worsening shortness of breath-found to be severely hypoxemic-CTA chest showed a small PE-she was started on heated high flow and subsequently admitted by the hospitalist service but due to worsening hypoxemia-she was transferred to the ICU for close monitoring.  She was stabilized and transferred back to [facility] on 4/10.  Significant events: 3/26-4/2>> hospitalization for hypoxia-rhinovirus/strep pneumo antigen positive-discharged on home O2 4/7>> worsening shortness of breath-worsening hypoxemia-admit to [facility] but later transferred to ICU 4/10>> transferred to [facility] 4/11>> O2 saturation in the 80s on 15 L of HFNC-CXR unchanged-switched to heated high flow-IV Lasix started-prednisone switched to Solu-Medrol. 4/14>> switch to salter high flow 4/15>> down to 5 L salter high flow 4/17>> Down to 2 L of oxygen-but still gets short of breath with minimal ambulation requiring around 8 L of oxygen.  Significant studies: 4/7>> CTA chest: Few scattered segmental PE medial left upper lobe/middle lobe-changing parenchymal lung opacities with areas of interstitial septal thickening and groundglass opacities. 4/8>> echo: EF 65-70% 4/8>> B/L lower extremity Doppler: Age indeterminate DVT left peroneal vein  Significant microbiology data: 4/7>> COVID/influenza/RSV PCR: Negative 4/7>> sputum culture: Pending 4/7>> blood culture: No  growth  Procedures: None  Consults: PCCM  Subjective: Feels better-Down to 2 L of oxygen at rest-but gets very short of breath with minimal activity.  Requires up 8 L of oxygen with ambulation.  Objective: Vitals: Blood pressure (!) 112/47, pulse 87, temperature 98.5 F (36.9 C), temperature source Oral, resp. rate 20, height 5\' 5"  (1.651 m), weight 70.7 kg, SpO2 90%.   Exam: Gen Exam:Alert awake-not in any distress HEENT:atraumatic, normocephalic Chest: B/L clear to auscultation anteriorly CVS:S1S2 regular Abdomen:soft non tender, non distended Extremities: Right AKA Neurology: Non focal Skin: no rash  Pertinent Labs/Radiology:    Latest Ref Rng & Units 09/18/2023    4:32 AM 09/17/2023    7:28 AM 09/16/2023    6:02 AM  CBC  WBC 4.0 - 10.5 K/uL 11.9  14.5  11.9   Hemoglobin 12.0 - 15.0 g/dL 9.7  9.4  9.4   Hematocrit 36.0 - 46.0 % 30.5  28.9  29.4   Platelets 150 - 400 K/uL 378  380  356     Lab Results  Component Value Date   NA 139 09/20/2023   K 4.0 09/20/2023   CL 103 09/20/2023   CO2 29 09/20/2023     Assessment/Plan: Acute hypoxic respiratory failure secondary to recent rhinovirus/strep pneumoniae-possible interstitial lung disease related to recent pneumonia-and now small PE Slowly improving-down to just 2 L of oxygen at rest but still very short of breath/with minimal activity-requiring up to 8 L of oxygen with ambulation. Completed IV Zosyn x 7 days on 4/15 Solu-Medrol changed to daily dosing by PCCM Volume status stable-does not require diuretics today Continue use of IS/flutter valve. Slowly titrate oxygen as tolerated further Will need reassessment of home O2 requirement on discharge.  Segmental left-sided pulmonary  embolism with left peroneal vein DVT Continue Eliquis  Normocytic anemia Likely due to critical illness No evidence of blood loss Folate/vitamin B12 level stable. Follow CBC  Chronic HFpEF Euvolemic IV Lasix as needed to maintain  negative balance  COPD Not in exacerbation Continue bronchodilators  HLD Statin  HTN BP stable-losartan remains on hold  Mood disorder Stable Continue Wellbutrin/Prozac  Dementia Delirium precautions Aricept  History of right BKA-uses prosthesis  Code status:   Code Status: Full Code   DVT Prophylaxis: apixaban (ELIQUIS) tablet 5 mg    Family Communication: Daughter-Marissa-[phone]-updated 4/17  Disposition Plan: Status is: Inpatient Remains inpatient appropriate because: Severity of illness   Planned Discharge Destination:Home   Diet: Diet Order             DIET DYS 3 Room service appropriate? Yes with Assist; Fluid consistency: Thin  Diet effective now                     Antimicrobial agents: Anti-infectives (From admission, onward)    Start     Dose/Rate Route Frequency Ordered Stop   09/16/23 1600  piperacillin-tazobactam (ZOSYN) IVPB 3.375 g       Placed in "Followed by" Linked Group   3.375 g 12.5 mL/hr over 240 Minutes Intravenous Every 8 hours 09/16/23 1144 09/19/23 1442   09/11/23 2200  vancomycin (VANCOREADY) IVPB 750 mg/150 mL  Status:  Discontinued        750 mg 150 mL/hr over 60 Minutes Intravenous Every 12 hours 09/11/23 0926 09/12/23 0958   09/11/23 2200  piperacillin-tazobactam (ZOSYN) IVPB 3.375 g  Status:  Discontinued       Placed in "Followed by" Linked Group   3.375 g 12.5 mL/hr over 240 Minutes Intravenous Every 8 hours 09/11/23 1549 09/16/23 1144   09/11/23 1600  azithromycin (ZITHROMAX) 500 mg in sodium chloride 0.9 % 250 mL IVPB        500 mg 250 mL/hr over 60 Minutes Intravenous Every 24 hours 09/11/23 1547 09/14/23 0857   09/11/23 1600  piperacillin-tazobactam (ZOSYN) IVPB 3.375 g       Placed in "Followed by" Linked Group   3.375 g 100 mL/hr over 30 Minutes Intravenous  Once 09/11/23 1549 09/11/23 2000   09/11/23 1200  ceFEPIme (MAXIPIME) 2 g in sodium chloride 0.9 % 100 mL IVPB  Status:  Discontinued        2  g 200 mL/hr over 30 Minutes Intravenous Every 8 hours 09/11/23 0926 09/11/23 1549   09/11/23 0615  vancomycin (VANCOCIN) IVPB 1000 mg/200 mL premix  Status:  Discontinued        1,000 mg 200 mL/hr over 60 Minutes Intravenous  Once 09/11/23 0603 09/11/23 0605   09/11/23 0615  ceFEPIme (MAXIPIME) 2 g in sodium chloride 0.9 % 100 mL IVPB        2 g 200 mL/hr over 30 Minutes Intravenous  Once 09/11/23 0603 09/11/23 0706   09/11/23 0615  vancomycin (VANCOREADY) IVPB 1500 mg/300 mL        1,500 mg 150 mL/hr over 120 Minutes Intravenous  Once 09/11/23 0605 09/11/23 0920        MEDICATIONS: Scheduled Meds:  apixaban  5 mg Oral BID   arformoterol  15 mcg Nebulization BID   budesonide (PULMICORT) nebulizer solution  0.5 mg Nebulization BID   buPROPion  300 mg Oral Daily   Chlorhexidine Gluconate Cloth  6 each Topical Q0600   donepezil  10 mg Oral QHS  FLUoxetine  20 mg Oral Daily   guaiFENesin  600 mg Oral BID   magic mouthwash w/lidocaine  10 mL Oral TID AC & HS   melatonin  3 mg Oral QHS   methylPREDNISolone (SOLU-MEDROL) injection  60 mg Intravenous Daily   montelukast  10 mg Oral QHS   pantoprazole  40 mg Oral Daily   revefenacin  175 mcg Nebulization Daily   rosuvastatin  10 mg Oral QHS   sodium chloride flush  3 mL Intravenous Q12H   Continuous Infusions:   PRN Meds:.acetaminophen **OR** acetaminophen, benzocaine, ipratropium-albuterol, ondansetron **OR** ondansetron (ZOFRAN) IV   I have personally reviewed following labs and imaging studies  LABORATORY DATA: CBC: Recent Labs  Lab 09/15/23 0551 09/16/23 0602 09/17/23 0728 09/18/23 0432  WBC 11.1* 11.9* 14.5* 11.9*  HGB 9.2* 9.4* 9.4* 9.7*  HCT 28.9* 29.4* 28.9* 30.5*  MCV 97.3 95.8 95.7 96.8  PLT 330 356 380 378    Basic Metabolic Panel: Recent Labs  Lab 09/16/23 0602 09/17/23 0728 09/18/23 0432 09/19/23 0435 09/20/23 0433  NA 138 137 139 140 139  K 3.6 3.6 4.4 4.1 4.0  CL 95* 95* 99 101 103  CO2 31  33* 32 32 29  GLUCOSE 122* 130* 143* 127* 102*  BUN 16 16 17 16 18   CREATININE 0.62 0.60 0.72 0.65 0.59  CALCIUM 8.7* 8.6* 8.7* 8.6* 8.3*    GFR: Estimated Creatinine Clearance: 60.9 mL/min (by C-G formula based on SCr of 0.59 mg/dL).  Liver Function Tests: No results for input(s): "AST", "ALT", "ALKPHOS", "BILITOT", "PROT", "ALBUMIN" in the last 168 hours.  No results for input(s): "LIPASE", "AMYLASE" in the last 168 hours. No results for input(s): "AMMONIA" in the last 168 hours.  Coagulation Profile: No results for input(s): "INR", "PROTIME" in the last 168 hours.   Cardiac Enzymes: No results for input(s): "CKTOTAL", "CKMB", "CKMBINDEX", "TROPONINI" in the last 168 hours.  BNP (last 3 results) No results for input(s): "PROBNP" in the last 8760 hours.  Lipid Profile: No results for input(s): "CHOL", "HDL", "LDLCALC", "TRIG", "CHOLHDL", "LDLDIRECT" in the last 72 hours.  Thyroid Function Tests: No results for input(s): "TSH", "T4TOTAL", "FREET4", "T3FREE", "THYROIDAB" in the last 72 hours.  Anemia Panel: No results for input(s): "VITAMINB12", "FOLATE", "FERRITIN", "TIBC", "IRON", "RETICCTPCT" in the last 72 hours.   Urine analysis:    Component Value Date/Time   COLORURINE YELLOW 09/11/2023 0812   APPEARANCEUR HAZY (A) 09/11/2023 0812   LABSPEC 1.018 09/11/2023 0812   PHURINE 5.0 09/11/2023 0812   GLUCOSEU NEGATIVE 09/11/2023 0812   GLUCOSEU NEGATIVE 09/08/2010 1617   HGBUR NEGATIVE 09/11/2023 0812   BILIRUBINUR NEGATIVE 09/11/2023 0812   KETONESUR 20 (A) 09/11/2023 0812   PROTEINUR NEGATIVE 09/11/2023 0812   UROBILINOGEN 0.2 09/08/2010 1617   NITRITE NEGATIVE 09/11/2023 0812   LEUKOCYTESUR NEGATIVE 09/11/2023 0812    Sepsis Labs: Lactic Acid, Venous    Component Value Date/Time   LATICACIDVEN 1.0 09/12/2023 0511    MICROBIOLOGY: Recent Results (from the past 240 hours)  MRSA Next Gen by PCR, Nasal     Status: None   Collection Time: 09/11/23  5:39 PM    Specimen: Nasal Mucosa; Nasal Swab  Result Value Ref Range Status   MRSA by PCR Next Gen NOT DETECTED NOT DETECTED Final    Comment: (NOTE) The GeneXpert MRSA Assay (FDA approved for NASAL specimens only), is one component of a comprehensive MRSA colonization surveillance program. It is not intended to diagnose MRSA infection nor to  guide or monitor treatment for MRSA infections. Test performance is not FDA approved in patients less than 77 years old. Performed at [facility], [address], [place], [place] [address]     RADIOLOGY STUDIES/RESULTS: No results found.    LOS: 10 days   Kimberly Penna, MD  [facility]    To contact the attending provider between 7A-7P or the covering provider during after hours 7P-7A, please log into the web site [email or web address] and access using universal [place] password for that web site. If you do not have the password, please call the hospital operator.  09/21/2023, 10:46 AM

## 2023-09-21 NOTE — Progress Notes (Addendum)
   09/21/23 1033  Mobility  Activity Ambulated with assistance in room  Level of Assistance Standby assist, set-up cues, supervision of patient - no hands on  Assistive Device Front wheel walker  Distance Ambulated (ft) 20 ft  Activity Response Tolerated fair  Mobility Referral Yes  Mobility visit 1 Mobility  Mobility Specialist Start Time (ACUTE ONLY) 1005  Mobility Specialist Stop Time (ACUTE ONLY) 1033  Mobility Specialist Time Calculation (min) (ACUTE ONLY) 28 min   Mobility Specialist: Progress Note - Visits:2  Pre-Mobility:      HR 109, SpO2 85% 4L During Mobility:               SpO2 83-86% 8L Post-Mobility:    HR 100, SpO2 92% 9L  Pt agreeable to mobility session - received in chair. Pt with consistent cough causing desat limiting further mobility. RN approval to titrate O2. Pt left on 9L to maintain sufficient SPO2 levels - RN aware. Returned to chair with all needs met - call bell within reach. Chair alarm on.   Pre-Mobility:      HR 82, SpO2 90% 3.5L Post-Mobility:    HR 88, SpO2 90 % 3.5L  Pt requesting transfer back to bed - agreeable to mobility session.  Pt with consistent cough throughout. Pt states "if I stay calm, I wont loose my breath." Pt with PLB for SPO2 recovery, did  not require titration. Return to bed with all needs met - call bell within reach.   Isla Mari, BS Mobility Specialist Please contact via SecureChat or  Rehab office at [phone].

## 2023-09-21 NOTE — Telephone Encounter (Signed)
 Called patient and got her scheduled for 5/15 at 10:45 A.M.

## 2023-09-22 DIAGNOSIS — J9601 Acute respiratory failure with hypoxia: Secondary | ICD-10-CM | POA: Diagnosis not present

## 2023-09-22 DIAGNOSIS — J189 Pneumonia, unspecified organism: Secondary | ICD-10-CM | POA: Diagnosis not present

## 2023-09-22 DIAGNOSIS — I5189 Other ill-defined heart diseases: Secondary | ICD-10-CM | POA: Diagnosis not present

## 2023-09-22 DIAGNOSIS — I2609 Other pulmonary embolism with acute cor pulmonale: Secondary | ICD-10-CM | POA: Diagnosis not present

## 2023-09-22 LAB — BASIC METABOLIC PANEL WITH GFR
Anion gap: 9 (ref 5–15)
BUN: 20 mg/dL (ref 8–23)
CO2: 28 mmol/L (ref 22–32)
Calcium: 8.6 mg/dL — ABNORMAL LOW (ref 8.9–10.3)
Chloride: 100 mmol/L (ref 98–111)
Creatinine, Ser: 0.59 mg/dL (ref 0.44–1.00)
GFR, Estimated: >60 mL/min (ref >60)
Glucose, Bld: 83 mg/dL (ref 70–99)
Potassium: 4.9 mmol/L (ref 3.5–5.1)
Sodium: 137 mmol/L (ref 135–145)

## 2023-09-22 LAB — GLUCOSE, CAPILLARY: Glucose-Capillary: 122 mg/dL — ABNORMAL HIGH (ref 70–99)

## 2023-09-22 MED ORDER — PREDNISONE 20 MG PO TABS
60.0000 mg | ORAL_TABLET | Freq: Every day | ORAL | Status: DC
  Administered 2023-09-23 – 2023-09-25 (×3): 60 mg via ORAL
  Filled 2023-09-22 (×3): qty 3

## 2023-09-22 NOTE — Progress Notes (Signed)
 Physical Therapy Treatment Patient Details Name: Shelly [name] MRN: [id number] DOB: [birth year] Today's Date: 09/22/2023   History of Present Illness 73 y.o. female admitted 09/11/23 with hypoxemia and PE. PMhx: Admission 3/26-4/2 with N/V, falls, CAP. Rt BKA, HTN, HLD, mild early Alzheimer's dementia, asthma, fibromyalgia, anxiety.    PT Comments  Slow progress towards functional goals, dry coughing spells, rapid drop in O2 sats with minimal mobility, and hypotension today (details below.) Able to ambulate short distance towards hallway and back to room with close supervision and +2 for safety/equipment support, RW to stabilize, no buckling but fatigues rapidly and stops frequently due to cough, moderate DOE. Reviewed LE exercises in chair. Encourage IS and flutter valve. Patient will continue to benefit from skilled physical therapy services to further improve independence with functional mobility.   Supine at rest 3L supplemental O2 sats 95%. Seated EOB 82% on 6L supplemental O2, 87-89% on 8L, 90% on 10L. Ambualted short distance 10L dropped quickly to 79% with increased dry cough episodes. Increased to 15L to return to room. Upon sitting sats improved and she was weaned to 8L within 5 minutes, 6L within 10 minutes. Unable to maintain sats > 88% on 4L so remained on 6L. BP noted to be 94/48 (MAP 63) reclined back significantlly and improved 114/86. (RN notified.)       If plan is discharge home, recommend the following: Assist for transportation;Help with stairs or ramp for entrance;Assistance with cooking/housework;A little help with walking and/or transfers;A little help with bathing/dressing/bathroom   Can travel by private vehicle        Equipment Recommendations  None recommended by PT    Recommendations for Other Services       Precautions / Restrictions Precautions Precautions: Fall Recall of Precautions/Restrictions: Intact Precaution/Restrictions Comments: watch  SpO2 Restrictions Weight Bearing Restrictions Per Provider Order: No     Mobility  Bed Mobility               General bed mobility comments: Sitting EOB when PT entered room.    Transfers Overall transfer level: Needs assistance Equipment used: Rolling walker (2 wheels) Transfers: Sit to/from Stand Sit to Stand: Supervision           General transfer comment: Supervision for safety. Able to safely donne RLE prosthesis at EOB. Good boost to stand and stabilized with RW for support. Details below regarding O2 sats.    Ambulation/Gait Ambulation/Gait assistance: Supervision, +2 safety/equipment Gait Distance (Feet): 28 Feet Assistive device: Rolling walker (2 wheels) Gait Pattern/deviations: Step-through pattern, Decreased stride length, Trunk flexed Gait velocity: dec Gait velocity interpretation: <1.8 ft/sec, indicate of risk for recurrent falls   General Gait Details: Initially on 10L supplemental O2, able to ambulate about 10 feet before sats dropped, dry cough, fatigue SpO2 79%, increased to 15L and ambulated to recliner with +2 assist for equipment. VC for breathing techniques, energy conservation. Navigates well with RW. 2/3 dyspnea, no dizziness but distracted by coughing.   Stairs             Wheelchair Mobility     Tilt Bed    Modified Rankin (Stroke Patients Only)       Balance Overall balance assessment: Needs assistance Sitting-balance support: No upper extremity supported, Feet supported Sitting balance-Leahy Scale: Good     Standing balance support: During functional activity, Single extremity supported Standing balance-Leahy Scale: Poor Standing balance comment: Single UE to steady. RLE prosthesis on.  Communication Communication Communication: No apparent difficulties  Cognition Arousal: Alert Behavior During Therapy: WFL for tasks assessed/performed   PT - Cognitive impairments: History of  cognitive impairments                       PT - Cognition Comments: mild early Alzheimer's dementia per prior notes. Following commands: Intact      Cueing Cueing Techniques: Verbal cues  Exercises General Exercises - Lower Extremity Ankle Circles/Pumps: AROM, Left, 15 reps, Seated Quad Sets: Strengthening, Both, 10 reps, Seated Gluteal Sets: Strengthening, Both, 15 reps, Seated    General Comments General comments (skin integrity, edema, etc.): Supine at rest 3L supplemental O2 sats 95%. Seated EOB 82% on 6L supplemental O2, 87-89% on 8L, 90% on 10L. Ambualted short distance 10L dropped quickly to 79% with increased dry cough episodes. Increased to 15L to return to room. Upon sitting sats improved and she was weaned to 8L within 5 minutes, 6L within 10 minutes. Unable to maintain sats > 88% on 4L so remained on 6L. BP noted to be 94/48 (MAP 63) reclined back significantlly and improved 114/86. (RN notified.)      Pertinent Vitals/Pain Pain Assessment Pain Assessment: No/denies pain    Home Living                          Prior Function            PT Goals (current goals can now be found in the care plan section) Acute Rehab PT Goals Patient Stated Goal: return home with plans to transition to [facility] soon PT Goal Formulation: With patient/family Time For Goal Achievement: 09/27/23 Potential to Achieve Goals: Good Progress towards PT goals: Progressing toward goals    Frequency    Min 2X/week      PT Plan      Co-evaluation              AM-PAC PT "6 Clicks" Mobility   Outcome Measure  Help needed turning from your back to your side while in a flat bed without using bedrails?: None Help needed moving from lying on your back to sitting on the side of a flat bed without using bedrails?: A Little Help needed moving to and from a bed to a chair (including a wheelchair)?: A Little Help needed standing up from a chair  using your arms (e.g., wheelchair or bedside chair)?: A Little Help needed to walk in hospital room?: A Little Help needed climbing 3-5 steps with a railing? : Total 6 Click Score: 17    End of Session Equipment Utilized During Treatment: Oxygen  Activity Tolerance: Treatment limited secondary to medical complications (Comment) (desat with minimal activity) Patient left: in chair;with call bell/phone within reach;with chair alarm set;with SCD's reapplied Nurse Communication: Mobility status (on 6L. Reclined back due to low BP with sitting upright in chair with feet elevated.) PT Visit Diagnosis: Other abnormalities of gait and mobility (R26.89);Muscle weakness (generalized) (M62.81);Difficulty in walking, not elsewhere classified (R26.2)     Time: 1610-9604 PT Time Calculation (min) (ACUTE ONLY): 23 min  Charges:    $Gait Training: 8-22 mins $Therapeutic Activity: 8-22 mins PT General Charges $$ ACUTE PT VISIT: 1 Visit                     Jory Ng, PT, DPT [facility]  Rehabilitation Services Physical Therapist Office: [phone] Website: [email or web address]  Alinda Irani 09/22/2023, 11:10 AM

## 2023-09-22 NOTE — Progress Notes (Signed)
 PROGRESS NOTE        PATIENT DETAILS Name: Shelly [name] Age: 73 y.o. Sex: female Date of Birth: July 09, 1950 Admit Date: 09/11/2023 Admitting Physician Josiah Nigh, MD ONG:[id number], Corbett Desanctis, MD  Brief Summary: Patient is a 73 y.o.  female with history of dementia, right BKA, HTN, HLD, COPD-who was hospitalized from 3/26-4/2 for acute hypoxic respiratory failure secondary to rhinovirus/strep pneumonia infection-patient was stabilized-discharged on home oxygen -presented to the ED on 4/7 with worsening shortness of breath-found to be severely hypoxemic-CTA chest showed a small PE-she was started on heated high flow and subsequently admitted by the hospitalist service but due to worsening hypoxemia-she was transferred to the ICU for close monitoring.  She was stabilized and transferred back to [facility] on 4/10.  Significant events: 3/26-4/2>> hospitalization for hypoxia-rhinovirus/strep pneumo antigen positive-discharged on home O2 4/7>> worsening shortness of breath-worsening hypoxemia-admit to [facility] but later transferred to ICU 4/10>> transferred to [facility] 4/11>> O2 saturation in the 80s on 15 L of HFNC-CXR unchanged-switched to heated high flow-IV Lasix  started-prednisone  switched to Solu-Medrol . 4/14>> switch to salter high flow 4/15>> down to 5 L salter high flow 4/17>> Down to 2-3 L of oxygen -but still gets short of breath with minimal ambulation requiring around 8-10 L of oxygen .  Significant studies: 4/7>> CTA chest: Few scattered segmental PE medial left upper lobe/middle lobe-changing parenchymal lung opacities with areas of interstitial septal thickening and groundglass opacities. 4/8>> echo: EF 65-70% 4/8>> B/L lower extremity Doppler: Age indeterminate DVT left peroneal vein  Significant microbiology data: 4/7>> COVID/influenza/RSV PCR: Negative 4/7>> sputum culture: Pending 4/7>> blood culture: No  growth  Procedures: None  Consults: PCCM  Subjective: Stable on 3-4 L of HFNC overnight at rest-but when she ambulated with physical therapy today required around 15 L to maintain O2 saturations.  Weaned back slowly down to 6 L.  Objective: Vitals: Blood pressure (!) 106/43, pulse 84, temperature 98.3 F (36.8 C), temperature source Oral, resp. rate 18, height 5\' 5"  (1.651 m), weight 70.7 kg, SpO2 97%.   Exam: Gen Exam:Alert awake-not in any distress HEENT:atraumatic, normocephalic Chest: B/L clear to auscultation anteriorly CVS:S1S2 regular Abdomen:soft non tender, non distended Extremities: Right AKA Neurology: Non focal Skin: no rash  Pertinent Labs/Radiology:    Latest Ref Rng & Units 09/18/2023    4:32 AM 09/17/2023    7:28 AM 09/16/2023    6:02 AM  CBC  WBC 4.0 - 10.5 K/uL 11.9  14.5  11.9   Hemoglobin 12.0 - 15.0 g/dL 9.7  9.4  9.4   Hematocrit 36.0 - 46.0 % 30.5  28.9  29.4   Platelets 150 - 400 K/uL 378  380  356     Lab Results  Component Value Date   NA 137 09/22/2023   K 4.9 09/22/2023   CL 100 09/22/2023   CO2 28 09/22/2023     Assessment/Plan: Acute hypoxic respiratory failure secondary to recent rhinovirus/strep pneumoniae-possible interstitial lung disease related to recent pneumonia-and now small PE Gradually improving-Down to 3-4 L of oxygen  at rest but still requires anywhere from 10-15 L of HFNC to maintain sats with ambulation. Completed IV Zosyn  x 7 days on 4/15 Was on Solu-Medrol  twice daily dosing-changed to daily dosing on 4/16-suspect can be switched to prednisone  60 mg daily today-with plans to decrease by 10 mg every week. CRP levels have essentially normalized. Volume status  stable-does not require diuretics (developed hypotension after IV Lasix ) Continue to mobilize as much as possible Encourage use of incentive spirometry/flutter valve Will require assessment for home O2 prior to discharge.    Segmental left-sided pulmonary embolism  with left peroneal vein DVT Continue Eliquis   Normocytic anemia Likely due to critical illness No evidence of blood loss Folate/vitamin B12 level stable. Follow CBC  Chronic HFpEF Euvolemic IV Lasix  as needed to maintain negative balance  COPD Not in exacerbation Continue bronchodilators  HLD Statin  HTN BP stable-losartan  remains on hold  Mood disorder Stable Continue Wellbutrin /Prozac   Dementia Delirium precautions Aricept   History of right BKA-uses prosthesis  Code status:   Code Status: Full Code   DVT Prophylaxis: apixaban  (ELIQUIS ) tablet 5 mg    Family Communication: Daughter-Marissa-[phone]-updated 4/18  Disposition Plan: Status is: Inpatient Remains inpatient appropriate because: Severity of illness   Planned Discharge Destination:Home   Diet: Diet Order             DIET DYS 3 Room service appropriate? Yes with Assist; Fluid consistency: Thin  Diet effective now                     Antimicrobial agents: Anti-infectives (From admission, onward)    Start     Dose/Rate Route Frequency Ordered Stop   09/16/23 1600  piperacillin -tazobactam (ZOSYN ) IVPB 3.375 g       Placed in "Followed by" Linked Group   3.375 g 12.5 mL/hr over 240 Minutes Intravenous Every 8 hours 09/16/23 1144 09/19/23 1442   09/11/23 2200  vancomycin  (VANCOREADY) IVPB 750 mg/150 mL  Status:  Discontinued        750 mg 150 mL/hr over 60 Minutes Intravenous Every 12 hours 09/11/23 0926 09/12/23 0958   09/11/23 2200  piperacillin -tazobactam (ZOSYN ) IVPB 3.375 g  Status:  Discontinued       Placed in "Followed by" Linked Group   3.375 g 12.5 mL/hr over 240 Minutes Intravenous Every 8 hours 09/11/23 1549 09/16/23 1144   09/11/23 1600  azithromycin  (ZITHROMAX ) 500 mg in sodium chloride  0.9 % 250 mL IVPB        500 mg 250 mL/hr over 60 Minutes Intravenous Every 24 hours 09/11/23 1547 09/14/23 0857   09/11/23 1600  piperacillin -tazobactam (ZOSYN ) IVPB 3.375 g        Placed in "Followed by" Linked Group   3.375 g 100 mL/hr over 30 Minutes Intravenous  Once 09/11/23 1549 09/11/23 2000   09/11/23 1200  ceFEPIme  (MAXIPIME ) 2 g in sodium chloride  0.9 % 100 mL IVPB  Status:  Discontinued        2 g 200 mL/hr over 30 Minutes Intravenous Every 8 hours 09/11/23 0926 09/11/23 1549   09/11/23 0615  vancomycin  (VANCOCIN ) IVPB 1000 mg/200 mL premix  Status:  Discontinued        1,000 mg 200 mL/hr over 60 Minutes Intravenous  Once 09/11/23 0603 09/11/23 0605   09/11/23 0615  ceFEPIme  (MAXIPIME ) 2 g in sodium chloride  0.9 % 100 mL IVPB        2 g 200 mL/hr over 30 Minutes Intravenous  Once 09/11/23 0603 09/11/23 0706   09/11/23 0615  vancomycin  (VANCOREADY) IVPB 1500 mg/300 mL        1,500 mg 150 mL/hr over 120 Minutes Intravenous  Once 09/11/23 0605 09/11/23 0920        MEDICATIONS: Scheduled Meds:  apixaban   5 mg Oral BID   arformoterol   15 mcg Nebulization BID  budesonide  (PULMICORT ) nebulizer solution  0.5 mg Nebulization BID   buPROPion   300 mg Oral Daily   Chlorhexidine  Gluconate Cloth  6 each Topical Q0600   donepezil   10 mg Oral QHS   FLUoxetine   20 mg Oral Daily   guaiFENesin   600 mg Oral BID   magic mouthwash w/lidocaine   10 mL Oral TID AC & HS   melatonin  3 mg Oral QHS   methylPREDNISolone  (SOLU-MEDROL ) injection  60 mg Intravenous Daily   montelukast   10 mg Oral QHS   pantoprazole   40 mg Oral Daily   revefenacin   175 mcg Nebulization Daily   rosuvastatin   10 mg Oral QHS   sodium chloride  flush  3 mL Intravenous Q12H   Continuous Infusions:   PRN Meds:.acetaminophen  **OR** acetaminophen , benzocaine , ipratropium-albuterol , ondansetron  **OR** ondansetron  (ZOFRAN ) IV   I have personally reviewed following labs and imaging studies  LABORATORY DATA: CBC: Recent Labs  Lab 09/16/23 0602 09/17/23 0728 09/18/23 0432  WBC 11.9* 14.5* 11.9*  HGB 9.4* 9.4* 9.7*  HCT 29.4* 28.9* 30.5*  MCV 95.8 95.7 96.8  PLT 356 380 378    Basic  Metabolic Panel: Recent Labs  Lab 09/17/23 0728 09/18/23 0432 09/19/23 0435 09/20/23 0433 09/22/23 0432  NA 137 139 140 139 137  K 3.6 4.4 4.1 4.0 4.9  CL 95* 99 101 103 100  CO2 33* 32 32 29 28  GLUCOSE 130* 143* 127* 102* 83  BUN 16 17 16 18 20   CREATININE 0.60 0.72 0.65 0.59 0.59  CALCIUM  8.6* 8.7* 8.6* 8.3* 8.6*    GFR: Estimated Creatinine Clearance: 60.9 mL/min (by C-G formula based on SCr of 0.59 mg/dL).  Liver Function Tests: No results for input(s): "AST", "ALT", "ALKPHOS", "BILITOT", "PROT", "ALBUMIN " in the last 168 hours.  No results for input(s): "LIPASE", "AMYLASE" in the last 168 hours. No results for input(s): "AMMONIA" in the last 168 hours.  Coagulation Profile: No results for input(s): "INR", "PROTIME" in the last 168 hours.   Cardiac Enzymes: No results for input(s): "CKTOTAL", "CKMB", "CKMBINDEX", "TROPONINI" in the last 168 hours.  BNP (last 3 results) No results for input(s): "PROBNP" in the last 8760 hours.  Lipid Profile: No results for input(s): "CHOL", "HDL", "LDLCALC", "TRIG", "CHOLHDL", "LDLDIRECT" in the last 72 hours.  Thyroid  Function Tests: No results for input(s): "TSH", "T4TOTAL", "FREET4", "T3FREE", "THYROIDAB" in the last 72 hours.  Anemia Panel: No results for input(s): "VITAMINB12", "FOLATE", "FERRITIN", "TIBC", "IRON", "RETICCTPCT" in the last 72 hours.   Urine analysis:    Component Value Date/Time   COLORURINE YELLOW 09/11/2023 0812   APPEARANCEUR HAZY (A) 09/11/2023 0812   LABSPEC 1.018 09/11/2023 0812   PHURINE 5.0 09/11/2023 0812   GLUCOSEU NEGATIVE 09/11/2023 0812   GLUCOSEU NEGATIVE 09/08/2010 1617   HGBUR NEGATIVE 09/11/2023 0812   BILIRUBINUR NEGATIVE 09/11/2023 0812   KETONESUR 20 (A) 09/11/2023 0812   PROTEINUR NEGATIVE 09/11/2023 0812   UROBILINOGEN 0.2 09/08/2010 1617   NITRITE NEGATIVE 09/11/2023 0812   LEUKOCYTESUR NEGATIVE 09/11/2023 0812    Sepsis Labs: Lactic Acid, Venous    Component Value  Date/Time   LATICACIDVEN 1.0 09/12/2023 0511    MICROBIOLOGY: No results found for this or any previous visit (from the past 240 hours).   RADIOLOGY STUDIES/RESULTS: No results found.    LOS: 11 days   Kimberly Penna, MD  [facility]    To contact the attending provider between 7A-7P or the covering provider during after hours 7P-7A, please log into the web  site [email or web address] and access using universal [place] password for that web site. If you do not have the password, please call the hospital operator.  09/22/2023, 11:22 AM

## 2023-09-22 NOTE — Plan of Care (Signed)

## 2023-09-22 NOTE — Progress Notes (Addendum)
 Mobility Specialist Progress Note:   09/22/23 1200  Mobility  Activity Dangled on edge of bed  Level of Assistance Minimal assist, patient does 75% or more  Assistive Device Other (Comment) (HHA)  Activity Response Tolerated well  Mobility Referral Yes  Mobility visit 1 Mobility  Mobility Specialist Start Time (ACUTE ONLY) [id number]  Mobility Specialist Stop Time (ACUTE ONLY) 0934  Mobility Specialist Time Calculation (min) (ACUTE ONLY) 12 min   Pt received in bed having no complaints and agreeable to mobility. Pt required MinA HHA to get to EOB. Upon reaching EOB O2 fell to 85% O2 flow increased to 6L/min, still pt rested at 85%-86%. PT entered room to begin  PT session. All needs met.   Pre Mobility 3L/min SPO2 95%   Inetta Manes Mobility Specialist  Please contact vis Secure Chat or  Rehab Office [phone]

## 2023-09-23 DIAGNOSIS — J9601 Acute respiratory failure with hypoxia: Secondary | ICD-10-CM | POA: Diagnosis not present

## 2023-09-23 LAB — BASIC METABOLIC PANEL WITH GFR
Anion gap: 11 (ref 5–15)
BUN: 16 mg/dL (ref 8–23)
CO2: 29 mmol/L (ref 22–32)
Calcium: 8.5 mg/dL — ABNORMAL LOW (ref 8.9–10.3)
Chloride: 96 mmol/L — ABNORMAL LOW (ref 98–111)
Creatinine, Ser: 0.57 mg/dL (ref 0.44–1.00)
GFR, Estimated: >60 mL/min (ref >60)
Glucose, Bld: 84 mg/dL (ref 70–99)
Potassium: 4 mmol/L (ref 3.5–5.1)
Sodium: 136 mmol/L (ref 135–145)

## 2023-09-23 LAB — CBC
HCT: 29.7 % — ABNORMAL LOW (ref 36.0–46.0)
Hemoglobin: 9.6 g/dL — ABNORMAL LOW (ref 12.0–15.0)
MCH: 31.2 pg (ref 26.0–34.0)
MCHC: 32.3 g/dL (ref 30.0–36.0)
MCV: 96.4 fL (ref 80.0–100.0)
Platelets: 310 10*3/uL (ref 150–400)
RBC: 3.08 MIL/uL — ABNORMAL LOW (ref 3.87–5.11)
RDW: 14 % (ref 11.5–15.5)
WBC: 12.9 10*3/uL — ABNORMAL HIGH (ref 4.0–10.5)
nRBC: 0 % (ref 0.0–0.2)

## 2023-09-23 LAB — C-REACTIVE PROTEIN: CRP: 1.6 mg/dL — ABNORMAL HIGH (ref <1.0)

## 2023-09-23 LAB — BRAIN NATRIURETIC PEPTIDE: B Natriuretic Peptide: 27.7 pg/mL (ref 0.0–100.0)

## 2023-09-23 LAB — GLUCOSE, CAPILLARY: Glucose-Capillary: 99 mg/dL (ref 70–99)

## 2023-09-23 MED ORDER — MAGIC MOUTHWASH W/LIDOCAINE
5.0000 mL | Freq: Four times a day (QID) | ORAL | Status: DC | PRN
  Filled 2023-09-23: qty 5

## 2023-09-23 MED ORDER — POLYVINYL ALCOHOL 1.4 % OP SOLN
1.0000 [drp] | OPHTHALMIC | Status: DC | PRN
  Filled 2023-09-23 (×2): qty 15

## 2023-09-23 NOTE — Progress Notes (Signed)
   09/23/23 1730  Mobility  Activity Ambulated with assistance in hallway  Level of Assistance Standby assist, set-up cues, supervision of patient - no hands on  Assistive Device Front wheel walker  Distance Ambulated (ft) 50 ft  Activity Response Tolerated fair  Mobility Referral Yes  Mobility visit 1 Mobility  Mobility Specialist Start Time (ACUTE ONLY) 1707  Mobility Specialist Stop Time (ACUTE ONLY) 1730  Mobility Specialist Time Calculation (min) (ACUTE ONLY) 23 min   Mobility Specialist: Progress Note  Pre-Mobility:      HR 76, SpO2 96% 6L During Mobility: SPO2 80-26% 6L to SpO2 92-95% 10L Post-Mobility:    HR 89, SpO2 89% 6L  Pt agreeable to mobility session - received in chair. C/o dry mouth and cough with exhale. Pt coughs with exhale causing desat and has to take a standing or seated break for spo2 recovery. Three standing breaks and one seated break required. Returned to chair with all needs met - call bell within reach.   Isla Mari, BS Mobility Specialist Please contact via SecureChat or  Rehab office at [phone].

## 2023-09-23 NOTE — Progress Notes (Signed)
 PROGRESS NOTE        PATIENT DETAILS Name: Shelly [name] Age: 73 y.o. Sex: female Date of Birth: [birth year] Admit Date: 09/11/2023 Admitting Physician Josiah Nigh, MD ZOX:[id number], Corbett Desanctis, MD  Brief Summary: Patient is a 73 y.o.  female with history of dementia, right BKA, HTN, HLD, COPD-who was hospitalized from 3/26-4/2 for acute hypoxic respiratory failure secondary to rhinovirus/strep pneumonia infection-patient was stabilized-discharged on home oxygen -presented to the ED on 4/7 with worsening shortness of breath-found to be severely hypoxemic-CTA chest showed a small PE-she was started on heated high flow and subsequently admitted by the hospitalist service but due to worsening hypoxemia-she was transferred to the ICU for close monitoring.  She was stabilized and transferred back to [facility] on 4/10.  Significant events: 3/26-4/2>> hospitalization for hypoxia-rhinovirus/strep pneumo antigen positive-discharged on home O2 4/7>> worsening shortness of breath-worsening hypoxemia-admit to [facility] but later transferred to ICU 4/10>> transferred to [facility] 4/11>> O2 saturation in the 80s on 15 L of HFNC-CXR unchanged-switched to heated high flow-IV Lasix  started-prednisone  switched to Solu-Medrol . 4/14>> switch to salter high flow 4/15>> down to 5 L salter high flow 4/17>> Down to 2-3 L of oxygen -but still gets short of breath with minimal ambulation requiring around 8-10 L of oxygen .  Significant studies: 4/7>> CTA chest: Few scattered segmental PE medial left upper lobe/middle lobe-changing parenchymal lung opacities with areas of interstitial septal thickening and groundglass opacities. 4/8>> echo: EF 65-70% 4/8>> B/L lower extremity Doppler: Age indeterminate DVT left peroneal vein  Significant microbiology data: 4/7>> COVID/influenza/RSV PCR: Negative 4/7>> sputum culture: Pending 4/7>> blood culture: No  growth  Procedures: None  Consults: PCCM  Subjective: Patient in bed, appears comfortable, denies any headache, no fever, no chest pain or pressure, improving shortness of breath , no abdominal pain. No new focal weakness.    Objective: Vitals: Blood pressure (!) 103/55, pulse 80, temperature (!) 97.4 F (36.3 C), temperature source Oral, resp. rate (!) 28, height 5\' 5"  (1.651 m), weight 66.7 kg, SpO2 97%.   Exam:  Awake Alert, No new F.N deficits, Normal affect [place].AT,PERRAL Supple Neck, No JVD,   Symmetrical Chest wall movement, Good air movement bilaterally, CTAB RRR,No Gallops, Rubs or new Murmurs,  +ve B.Sounds, Abd Soft, No tenderness,   No Cyanosis, Clubbing or edema    Assessment/Plan: Acute hypoxic respiratory failure secondary to recent rhinovirus/strep pneumoniae-possible interstitial lung disease related to recent pneumonia-and now small PE Gradually improving-Down to 3-4 L of oxygen  at rest but still requires anywhere from 10-15 L of HFNC to maintain sats with ambulation. Completed IV Zosyn  x 7 days on 4/15 Was on Solu-Medrol  twice daily dosing-changed to daily dosing on 4/16-suspect can be switched to prednisone  60 mg daily today-with plans to decrease by 10 mg every week. CRP levels have essentially normalized. Volume status stable-does not require diuretics (developed hypotension after IV Lasix ) Continue to mobilize as much as possible Encourage use of incentive spirometry/flutter valve Will require assessment for home O2 prior to discharge.    SpO2: 97 % O2 Flow Rate (L/min): 6 L/min FiO2 (%): 50 % (Titrated to 50%, O2 saturations 94%94)   Segmental left-sided pulmonary embolism with left peroneal vein DVT Continue Eliquis   Normocytic anemia Likely due to critical illness No evidence of blood loss Folate/vitamin B12 level stable. Follow CBC  Chronic HFpEF Euvolemic IV Lasix  as needed to maintain  negative balance  COPD Not in  exacerbation Continue bronchodilators  HLD Statin  HTN BP stable-losartan  remains on hold  Mood disorder Stable Continue Wellbutrin /Prozac   Dementia Delirium precautions Aricept   History of right BKA-uses prosthesis  Code status:   Code Status: Full Code   DVT Prophylaxis: apixaban  (ELIQUIS ) tablet 5 mg    Family Communication: Daughter-Marissa-[phone]-updated 4/18  Disposition Plan: Status is: Inpatient Remains inpatient appropriate because: Severity of illness   Planned Discharge Destination:Home   Diet: Diet Order             DIET DYS 3 Room service appropriate? Yes with Assist; Fluid consistency: Thin  Diet effective now                    MEDICATIONS: Scheduled Meds:  apixaban   5 mg Oral BID   arformoterol   15 mcg Nebulization BID   budesonide  (PULMICORT ) nebulizer solution  0.5 mg Nebulization BID   buPROPion   300 mg Oral Daily   Chlorhexidine  Gluconate Cloth  6 each Topical Q0600   donepezil   10 mg Oral QHS   FLUoxetine   20 mg Oral Daily   guaiFENesin   600 mg Oral BID   melatonin  3 mg Oral QHS   montelukast   10 mg Oral QHS   pantoprazole   40 mg Oral Daily   predniSONE   60 mg Oral Q breakfast   revefenacin   175 mcg Nebulization Daily   rosuvastatin   10 mg Oral QHS   sodium chloride  flush  3 mL Intravenous Q12H   Continuous Infusions:   PRN Meds:.acetaminophen  **OR** acetaminophen , benzocaine , ipratropium-albuterol , ondansetron  **OR** ondansetron  (ZOFRAN ) IV   I have personally reviewed following labs and imaging studies  LABORATORY DATA: CBC: Recent Labs  Lab 09/17/23 0728 09/18/23 0432 09/23/23 0438  WBC 14.5* 11.9* 12.9*  HGB 9.4* 9.7* 9.6*  HCT 28.9* 30.5* 29.7*  MCV 95.7 96.8 96.4  PLT 380 378 310    Basic Metabolic Panel: Recent Labs  Lab 09/18/23 0432 09/19/23 0435 09/20/23 0433 09/22/23 0432 09/23/23 0438  NA 139 140 139 137 136  K 4.4 4.1 4.0 4.9 4.0  CL 99 101 103 100 96*  CO2 32 32 29 28 29    GLUCOSE 143* 127* 102* 83 84  BUN 17 16 18 20 16   CREATININE 0.72 0.65 0.59 0.59 0.57  CALCIUM  8.7* 8.6* 8.3* 8.6* 8.5*    Urine analysis:    Component Value Date/Time   COLORURINE YELLOW 09/11/2023 0812   APPEARANCEUR HAZY (A) 09/11/2023 0812   LABSPEC 1.018 09/11/2023 0812   PHURINE 5.0 09/11/2023 0812   GLUCOSEU NEGATIVE 09/11/2023 0812   GLUCOSEU NEGATIVE 09/08/2010 1617   HGBUR NEGATIVE 09/11/2023 0812   BILIRUBINUR NEGATIVE 09/11/2023 0812   KETONESUR 20 (A) 09/11/2023 0812   PROTEINUR NEGATIVE 09/11/2023 0812   UROBILINOGEN 0.2 09/08/2010 1617   NITRITE NEGATIVE 09/11/2023 0812   LEUKOCYTESUR NEGATIVE 09/11/2023 0812    Sepsis Labs: Lactic Acid, Venous    Component Value Date/Time   LATICACIDVEN 1.0 09/12/2023 0511    MICROBIOLOGY: No results found for this or any previous visit (from the past 240 hours).   RADIOLOGY STUDIES/RESULTS: No results found.    LOS: 12 days   Signature  -    Lynnwood Sauer M.D on 09/23/2023 at 9:25 AM   -  To page go to [email or web address]

## 2023-09-23 NOTE — Plan of Care (Signed)

## 2023-09-24 ENCOUNTER — Inpatient Hospital Stay (HOSPITAL_COMMUNITY)

## 2023-09-24 DIAGNOSIS — J9601 Acute respiratory failure with hypoxia: Secondary | ICD-10-CM | POA: Diagnosis not present

## 2023-09-24 NOTE — Plan of Care (Signed)

## 2023-09-24 NOTE — Progress Notes (Signed)
 PROGRESS NOTE        PATIENT DETAILS Name: Shelly [name] Age: 73 y.o. Sex: female Date of Birth: April 25, 1951 Admit Date: 09/11/2023 Admitting Physician Josiah Nigh, MD [id number], Corbett Desanctis, MD  Brief Summary: Patient is a 73 y.o.  female with history of dementia, right BKA, HTN, HLD, COPD-who was hospitalized from 3/26-4/2 for acute hypoxic respiratory failure secondary to rhinovirus/strep pneumonia infection-patient was stabilized-discharged on home oxygen -presented to the ED on 4/7 with worsening shortness of breath-found to be severely hypoxemic-CTA chest showed a small PE-she was started on heated high flow and subsequently admitted by the hospitalist service but due to worsening hypoxemia-she was transferred to the ICU for close monitoring.  She was stabilized and transferred back to [facility] on 4/10.  Significant events: 3/26-4/2>> hospitalization for hypoxia-rhinovirus/strep pneumo antigen positive-discharged on home O2 4/7>> worsening shortness of breath-worsening hypoxemia-admit to [facility] but later transferred to ICU 4/10>> transferred to [facility] 4/11>> O2 saturation in the 80s on 15 L of HFNC-CXR unchanged-switched to heated high flow-IV Lasix  started-prednisone  switched to Solu-Medrol . 4/14>> switch to salter high flow 4/15>> down to 5 L salter high flow 4/17>> Down to 2-3 L of oxygen -but still gets short of breath with minimal ambulation requiring around 8-10 L of oxygen .  Significant studies: 4/7>> CTA chest: Few scattered segmental PE medial left upper lobe/middle lobe-changing parenchymal lung opacities with areas of interstitial septal thickening and groundglass opacities. 4/8>> echo: EF 65-70% 4/8>> B/L lower extremity Doppler: Age indeterminate DVT left peroneal vein  Significant microbiology data: 4/7>> COVID/influenza/RSV PCR: Negative 4/7>> sputum culture: Pending 4/7>> blood culture: No growth  Procedures: None  Consults: PCCM  Subjective:   Patient in bed, appears comfortable, denies any headache, no fever, no chest pain or pressure, continues to have improvement in her shortness of breath , no abdominal pain. No focal weakness.   Objective: Vitals: Blood pressure (!) 116/59, pulse 71, temperature 97.8 F (36.6 C), temperature source Oral, resp. rate 17, height 5\' 5"  (1.651 m), weight 65.6 kg, SpO2 97%.   Exam:  Awake Alert, No new F.N deficits, Normal affect [place].AT,PERRAL Supple Neck, No JVD,   Symmetrical Chest wall movement, Good air movement bilaterally, CTAB RRR,No Gallops, Rubs or new Murmurs,  +ve B.Sounds, Abd Soft, No tenderness,   No Cyanosis, Clubbing or edema    Assessment/Plan:  Acute hypoxic respiratory failure secondary to recent rhinovirus/strep pneumoniae-possible interstitial lung disease related to recent pneumonia-and now small PE Gradually improving-Down to 3-4 L of oxygen  at rest but still requires anywhere from 10-15 L of HFNC to maintain sats with ambulation. Completed IV Zosyn  x 7 days on 4/15 Was on Solu-Medrol  twice daily dosing-changed to daily dosing on 4/16-suspect can be switched to prednisone  60 mg daily today-with plans to decrease by 10 mg every week. CRP levels have essentially normalized. Volume status stable-does not require diuretics (developed hypotension after IV Lasix ) Continue to mobilize as much as possible Encourage use of incentive spirometry/flutter valve Will require assessment for home O2 prior to discharge.    SpO2: 97 % O2 Flow Rate (L/min): 4 L/min FiO2 (%): 50 % (Titrated to 50%, O2 saturations 94%94)   Segmental left-sided pulmonary embolism with left peroneal vein DVT Continue Eliquis   Normocytic anemia Likely due to critical illness No evidence of blood loss Folate/vitamin B12 level stable. Follow CBC  Chronic HFpEF Euvolemic IV Lasix  as  needed to maintain negative balance  COPD Not in exacerbation Continue bronchodilators  HLD Statin  HTN BP  stable-losartan  remains on hold  Mood disorder Stable Continue Wellbutrin /Prozac   Dementia Delirium precautions Aricept   History of right BKA-uses prosthesis  Code status:   Code Status: Full Code   DVT Prophylaxis: apixaban  (ELIQUIS ) tablet 5 mg    Family Communication: Daughter-Marissa-[phone]-updated 4/18  Disposition Plan: Status is: Inpatient Remains inpatient appropriate because: Severity of illness   Planned Discharge Destination:Home   Diet: Diet Order             DIET DYS 3 Room service appropriate? Yes with Assist; Fluid consistency: Thin  Diet effective now                    MEDICATIONS: Scheduled Meds:  apixaban   5 mg Oral BID   arformoterol   15 mcg Nebulization BID   budesonide  (PULMICORT ) nebulizer solution  0.5 mg Nebulization BID   buPROPion   300 mg Oral Daily   Chlorhexidine  Gluconate Cloth  6 each Topical Q0600   donepezil   10 mg Oral QHS   FLUoxetine   20 mg Oral Daily   guaiFENesin   600 mg Oral BID   melatonin  3 mg Oral QHS   montelukast   10 mg Oral QHS   pantoprazole   40 mg Oral Daily   predniSONE   60 mg Oral Q breakfast   revefenacin   175 mcg Nebulization Daily   rosuvastatin   10 mg Oral QHS   sodium chloride  flush  3 mL Intravenous Q12H   Continuous Infusions:   PRN Meds:.acetaminophen  **OR** acetaminophen , ipratropium-albuterol , magic mouthwash w/lidocaine , ondansetron  **OR** ondansetron  (ZOFRAN ) IV, polyvinyl alcohol    I have personally reviewed following labs and imaging studies  LABORATORY DATA: CBC: Recent Labs  Lab 09/18/23 0432 09/23/23 0438  WBC 11.9* 12.9*  HGB 9.7* 9.6*  HCT 30.5* 29.7*  MCV 96.8 96.4  PLT 378 310    Basic Metabolic Panel: Recent Labs  Lab 09/18/23 0432 09/19/23 0435 09/20/23 0433 09/22/23 0432 09/23/23 0438  NA 139 140 139 137 136  K 4.4 4.1 4.0 4.9 4.0  CL 99 101 103 100 96*  CO2 32 32 29 28 29   GLUCOSE 143* 127* 102* 83 84  BUN 17 16 18 20 16   CREATININE 0.72 0.65  0.59 0.59 0.57  CALCIUM  8.7* 8.6* 8.3* 8.6* 8.5*    Urine analysis:    Component Value Date/Time   COLORURINE YELLOW 09/11/2023 0812   APPEARANCEUR HAZY (A) 09/11/2023 0812   LABSPEC 1.018 09/11/2023 0812   PHURINE 5.0 09/11/2023 0812   GLUCOSEU NEGATIVE 09/11/2023 0812   GLUCOSEU NEGATIVE 09/08/2010 1617   HGBUR NEGATIVE 09/11/2023 0812   BILIRUBINUR NEGATIVE 09/11/2023 0812   KETONESUR 20 (A) 09/11/2023 0812   PROTEINUR NEGATIVE 09/11/2023 0812   UROBILINOGEN 0.2 09/08/2010 1617   NITRITE NEGATIVE 09/11/2023 0812   LEUKOCYTESUR NEGATIVE 09/11/2023 0812    Sepsis Labs: Lactic Acid, Venous    Component Value Date/Time   LATICACIDVEN 1.0 09/12/2023 0511    MICROBIOLOGY: No results found for this or any previous visit (from the past 240 hours).   RADIOLOGY STUDIES/RESULTS: DG Chest Port 1 View Result Date: 09/24/2023 CLINICAL DATA:  Shortness of breath EXAM: PORTABLE CHEST 1 VIEW COMPARISON:  09/17/2023 FINDINGS: Lungs are hypoinflated. Stable cardiomediastinal contours. Unchanged bilateral lower lung zone reticular interstitial opacities. Compared with the previous exam there has been no significant change in aeration to lungs. Visualized osseous structures appear unremarkable. IMPRESSION: 1. Persistent bilateral  reticular interstitial opacities with a lower lung zone predominance. No new findings. Electronically Signed   By: Kimberley Penman M.D.   On: 09/24/2023 08:39    LOS: 13 days   Signature  -    Lynnwood Sauer M.D on 09/24/2023 at 10:47 AM   -  To page go to [email or web address]

## 2023-09-24 NOTE — Progress Notes (Signed)
   09/24/23 1530  Mobility  Activity Ambulated with assistance in hallway  Level of Assistance Standby assist, set-up cues, supervision of patient - no hands on  Assistive Device Front wheel walker  Distance Ambulated (ft) 50 ft  Activity Response Tolerated fair  Mobility Referral Yes  Mobility visit 1 Mobility  Mobility Specialist Start Time (ACUTE ONLY) 1530  Mobility Specialist Stop Time (ACUTE ONLY) 1600  Mobility Specialist Time Calculation (min) (ACUTE ONLY) 30 min   Mobility Specialist: Progress Note   Pre-Mobility:    SpO2 96% 4L During Mobility: SPO2 80-85% 8L to SpO2 92-95% 10L Post-Mobility:   SpO2 90% 4L   Pt agreeable to mobility session - received in chair. C/o phelm cough with exhale. Pt with blood on tissue after blowing nose - RN immediately notified. Pt coughs with exhale causing desat and has to take a standing or seated break for spo2 recovery. One standing breaks and one seated break required. Returned to chair with all needs met - call bell within reach.    Isla Mari, BS Mobility Specialist Please contact via SecureChat or  Rehab office at [phone].

## 2023-09-25 ENCOUNTER — Encounter: Admitting: Physical Therapy

## 2023-09-25 DIAGNOSIS — J9601 Acute respiratory failure with hypoxia: Secondary | ICD-10-CM | POA: Diagnosis not present

## 2023-09-25 LAB — CBC WITH DIFFERENTIAL/PLATELET
Abs Immature Granulocytes: 0.16 10*3/uL — ABNORMAL HIGH (ref 0.00–0.07)
Basophils Absolute: 0 10*3/uL (ref 0.0–0.1)
Basophils Relative: 0 %
Eosinophils Absolute: 0.5 10*3/uL (ref 0.0–0.5)
Eosinophils Relative: 3 %
HCT: 31.4 % — ABNORMAL LOW (ref 36.0–46.0)
Hemoglobin: 10.1 g/dL — ABNORMAL LOW (ref 12.0–15.0)
Immature Granulocytes: 1 %
Lymphocytes Relative: 18 %
Lymphs Abs: 2.4 10*3/uL (ref 0.7–4.0)
MCH: 31 pg (ref 26.0–34.0)
MCHC: 32.2 g/dL (ref 30.0–36.0)
MCV: 96.3 fL (ref 80.0–100.0)
Monocytes Absolute: 0.9 10*3/uL (ref 0.1–1.0)
Monocytes Relative: 7 %
Neutro Abs: 9.3 10*3/uL — ABNORMAL HIGH (ref 1.7–7.7)
Neutrophils Relative %: 71 %
Platelets: 314 10*3/uL (ref 150–400)
RBC: 3.26 MIL/uL — ABNORMAL LOW (ref 3.87–5.11)
RDW: 14 % (ref 11.5–15.5)
WBC: 13.2 10*3/uL — ABNORMAL HIGH (ref 4.0–10.5)
nRBC: 0 % (ref 0.0–0.2)

## 2023-09-25 LAB — BASIC METABOLIC PANEL WITH GFR
Anion gap: 6 (ref 5–15)
BUN: 14 mg/dL (ref 8–23)
CO2: 32 mmol/L (ref 22–32)
Calcium: 8.9 mg/dL (ref 8.9–10.3)
Chloride: 99 mmol/L (ref 98–111)
Creatinine, Ser: 0.62 mg/dL (ref 0.44–1.00)
GFR, Estimated: >60 mL/min (ref >60)
Glucose, Bld: 81 mg/dL (ref 70–99)
Potassium: 4.2 mmol/L (ref 3.5–5.1)
Sodium: 137 mmol/L (ref 135–145)

## 2023-09-25 LAB — MAGNESIUM: Magnesium: 2 mg/dL (ref 1.7–2.4)

## 2023-09-25 LAB — BRAIN NATRIURETIC PEPTIDE: B Natriuretic Peptide: 20.5 pg/mL (ref 0.0–100.0)

## 2023-09-25 LAB — PROCALCITONIN: Procalcitonin: <0.1 ng/mL

## 2023-09-25 LAB — C-REACTIVE PROTEIN: CRP: 0.7 mg/dL (ref <1.0)

## 2023-09-25 MED ORDER — PREDNISONE 20 MG PO TABS
40.0000 mg | ORAL_TABLET | Freq: Every day | ORAL | Status: DC
  Administered 2023-09-26 – 2023-09-29 (×4): 40 mg via ORAL
  Filled 2023-09-25 (×4): qty 2

## 2023-09-25 MED ORDER — LACTATED RINGERS IV SOLN: INTRAVENOUS | Status: AC

## 2023-09-25 NOTE — Progress Notes (Signed)
 PROGRESS NOTE        PATIENT DETAILS Name: Shelly [name] Age: 73 y.o. Sex: female Date of Birth: 12-Jun-1950 Admit Date: 09/11/2023 Admitting Physician Josiah Nigh, MD [id number], Corbett Desanctis, MD  Brief Summary: Patient is a 73 y.o.  female with history of dementia, right BKA, HTN, HLD, COPD-who was hospitalized from 3/26-4/2 for acute hypoxic respiratory failure secondary to rhinovirus/strep pneumonia infection-patient was stabilized-discharged on home oxygen -presented to the ED on 4/7 with worsening shortness of breath-found to be severely hypoxemic-CTA chest showed a small PE-she was started on heated high flow and subsequently admitted by the hospitalist service but due to worsening hypoxemia-she was transferred to the ICU for close monitoring.  She was stabilized and transferred back to [facility] on 4/10.  Significant events: 3/26-4/2>> hospitalization for hypoxia-rhinovirus/strep pneumo antigen positive-discharged on home O2 4/7>> worsening shortness of breath-worsening hypoxemia-admit to [facility] but later transferred to ICU 4/10>> transferred to [facility] 4/11>> O2 saturation in the 80s on 15 L of HFNC-CXR unchanged-switched to heated high flow-IV Lasix  started-prednisone  switched to Solu-Medrol . 4/14>> switch to salter high flow 4/15>> down to 5 L salter high flow 4/17>> Down to 2-3 L of oxygen -but still gets short of breath with minimal ambulation requiring around 8-10 L of oxygen .  Significant studies: 4/7>> CTA chest: Few scattered segmental PE medial left upper lobe/middle lobe-changing parenchymal lung opacities with areas of interstitial septal thickening and groundglass opacities. 4/8>> echo: EF 65-70% 4/8>> B/L lower extremity Doppler: Age indeterminate DVT left peroneal vein  Significant microbiology data: 4/7>> COVID/influenza/RSV PCR: Negative 4/7>> sputum culture: Pending 4/7>> blood culture: No growth  Procedures: None  Consults: PCCM  Subjective:  Patient in bed, appears comfortable, denies any headache, no fever, no chest pain or pressure, no shortness of breath , no abdominal pain. No focal weakness.   Objective: Vitals: Blood pressure (!) 104/58, pulse 71, temperature 97.8 F (36.6 C), temperature source Axillary, resp. rate 17, height 5\' 5"  (1.651 m), weight 65.3 kg, SpO2 95%.   Exam:  Awake Alert, No new F.N deficits, Normal affect [place].AT,PERRAL Supple Neck, No JVD,   Symmetrical Chest wall movement, Good air movement bilaterally, CTAB RRR,No Gallops, Rubs or new Murmurs,  +ve B.Sounds, Abd Soft, No tenderness,   No Cyanosis, Clubbing or edema    Assessment/Plan:  Acute hypoxic respiratory failure secondary to recent rhinovirus/strep pneumoniae-possible interstitial lung disease related to recent pneumonia-and now small PE Gradually improving-Down to 3-4 L of oxygen  at rest but still requires anywhere from 10-15 L of HFNC to maintain sats with ambulation. Completed IV Zosyn  x 7 days on 4/15 On prednisone  taper, gradually taper over 2-3 weeks.    She has shown significant improvement in the last 2 days upon sitting up in chair and using I-S and flutter valve for pulmonary toiletry, down to 2 L nasal cannula oxygen  from 6-8.  Continue to monitor.  SpO2: 95 % O2 Flow Rate (L/min): 2 L/min FiO2 (%): 50 % (Titrated to 50%, O2 saturations 94%94)   Segmental left-sided pulmonary embolism with left peroneal vein DVT Continue Eliquis   Normocytic anemia Likely due to critical illness No evidence of blood loss Folate/vitamin B12 level stable. Follow CBC  Chronic HFpEF Euvolemic IV Lasix  as needed to maintain negative balance  COPD Not in exacerbation Continue bronchodilators  HLD Statin  HTN BP stable-losartan  remains on hold  Mood disorder Stable  Continue Wellbutrin /Prozac   Dementia Delirium precautions Aricept   History of right BKA-uses prosthesis  Code status:   Code Status: Full Code   DVT  Prophylaxis: apixaban  (ELIQUIS ) tablet 5 mg    Family Communication: Daughter-Marissa-[phone]-updated 4/18  Disposition Plan: Status is: Inpatient Remains inpatient appropriate because: Severity of illness   Planned Discharge Destination:Home   Diet: Diet Order             DIET DYS 3 Room service appropriate? Yes with Assist; Fluid consistency: Thin  Diet effective now                    MEDICATIONS: Scheduled Meds:  apixaban   5 mg Oral BID   arformoterol   15 mcg Nebulization BID   budesonide  (PULMICORT ) nebulizer solution  0.5 mg Nebulization BID   buPROPion   300 mg Oral Daily   Chlorhexidine  Gluconate Cloth  6 each Topical Q0600   donepezil   10 mg Oral QHS   FLUoxetine   20 mg Oral Daily   guaiFENesin   600 mg Oral BID   melatonin  3 mg Oral QHS   montelukast   10 mg Oral QHS   pantoprazole   40 mg Oral Daily   [START ON 09/26/2023] predniSONE   40 mg Oral Q breakfast   revefenacin   175 mcg Nebulization Daily   rosuvastatin   10 mg Oral QHS   sodium chloride  flush  3 mL Intravenous Q12H   Continuous Infusions:  lactated ringers       PRN Meds:.acetaminophen  **OR** acetaminophen , ipratropium-albuterol , magic mouthwash w/lidocaine , ondansetron  **OR** ondansetron  (ZOFRAN ) IV, polyvinyl alcohol    I have personally reviewed following labs and imaging studies  LABORATORY DATA: CBC: Recent Labs  Lab 09/23/23 0438 09/25/23 0436  WBC 12.9* 13.2*  NEUTROABS  --  9.3*  HGB 9.6* 10.1*  HCT 29.7* 31.4*  MCV 96.4 96.3  PLT 310 314    Basic Metabolic Panel: Recent Labs  Lab 09/19/23 0435 09/20/23 0433 09/22/23 0432 09/23/23 0438 09/25/23 0436  NA 140 139 137 136 137  K 4.1 4.0 4.9 4.0 4.2  CL 101 103 100 96* 99  CO2 32 29 28 29  32  GLUCOSE 127* 102* 83 84 81  BUN 16 18 20 16 14   CREATININE 0.65 0.59 0.59 0.57 0.62  CALCIUM  8.6* 8.3* 8.6* 8.5* 8.9  MG  --   --   --   --  2.0    Urine analysis:    Component Value Date/Time   COLORURINE YELLOW  09/11/2023 0812   APPEARANCEUR HAZY (A) 09/11/2023 0812   LABSPEC 1.018 09/11/2023 0812   PHURINE 5.0 09/11/2023 0812   GLUCOSEU NEGATIVE 09/11/2023 0812   GLUCOSEU NEGATIVE 09/08/2010 1617   HGBUR NEGATIVE 09/11/2023 0812   BILIRUBINUR NEGATIVE 09/11/2023 0812   KETONESUR 20 (A) 09/11/2023 0812   PROTEINUR NEGATIVE 09/11/2023 0812   UROBILINOGEN 0.2 09/08/2010 1617   NITRITE NEGATIVE 09/11/2023 0812   LEUKOCYTESUR NEGATIVE 09/11/2023 0812    Sepsis Labs: Lactic Acid, Venous    Component Value Date/Time   LATICACIDVEN 1.0 09/12/2023 0511    MICROBIOLOGY: No results found for this or any previous visit (from the past 240 hours).   RADIOLOGY STUDIES/RESULTS: DG Chest Port 1 View Result Date: 09/24/2023 CLINICAL DATA:  Shortness of breath EXAM: PORTABLE CHEST 1 VIEW COMPARISON:  09/17/2023 FINDINGS: Lungs are hypoinflated. Stable cardiomediastinal contours. Unchanged bilateral lower lung zone reticular interstitial opacities. Compared with the previous exam there has been no significant change in aeration to lungs. Visualized osseous structures appear unremarkable.  IMPRESSION: 1. Persistent bilateral reticular interstitial opacities with a lower lung zone predominance. No new findings. Electronically Signed   By: Kimberley Penman M.D.   On: 09/24/2023 08:39    LOS: 14 days   Signature  -    Lynnwood Sauer M.D on 09/25/2023 at 9:06 AM   -  To page go to [email or web address]

## 2023-09-25 NOTE — Plan of Care (Signed)
  Problem: Clinical Measurements: Goal: Will remain free from infection Outcome: Progressing Goal: Respiratory complications will improve Outcome: Progressing Goal: Cardiovascular complication will be avoided Outcome: Progressing   Problem: Activity: Goal: Risk for activity intolerance will decrease Outcome: Progressing   Problem: Nutrition: Goal: Adequate nutrition will be maintained Outcome: Progressing   Problem: Coping: Goal: Level of anxiety will decrease Outcome: Progressing   Problem: Elimination: Goal: Will not experience complications related to bowel motility Outcome: Progressing

## 2023-09-25 NOTE — Progress Notes (Signed)
 Occupational Therapy Treatment Patient Details Name: Shelly [name] MRN: [id number] DOB: 1950/09/24 Today's Date: 09/25/2023   History of present illness 73 y.o. female admitted 09/11/23 with hypoxemia and PE. PMhx: Admission 3/26-4/2 with N/V, falls, CAP. Rt BKA, HTN, HLD, mild early Alzheimer's dementia, asthma, fibromyalgia, anxiety.   OT comments  Pt making fair progress towards OT goals though remains limited by cardiopulmonary endurance deficits. Pt able to mobilize to/from bathroom using RW with no more than Supervision-CGA for ADLs/mobility. However, pt requiring up to 8 L O2 to prevent desats into the 70s during session. Brief requirement of 10 L O2 for recovery at end of session but overall tolerated 8 L O2 during session. Emphasis on self monitoring symptoms, ADL modification at home to minimize energy required to complete tasks.       If plan is discharge home, recommend the following:  A little help with bathing/dressing/bathroom;Assistance with cooking/housework;Assist for transportation;Help with stairs or ramp for entrance   Equipment Recommendations  None recommended by OT    Recommendations for Other Services      Precautions / Restrictions Precautions Precautions: Fall Recall of Precautions/Restrictions: Intact Precaution/Restrictions Comments: watch SpO2 Restrictions Weight Bearing Restrictions Per Provider Order: No       Mobility Bed Mobility Overal bed mobility: Modified Independent Bed Mobility: Supine to Sit                Transfers Overall transfer level: Needs assistance Equipment used: Rolling walker (2 wheels) Transfers: Sit to/from Stand Sit to Stand: Supervision           General transfer comment: from bedside and toilet with RW     Balance Overall balance assessment: Needs assistance Sitting-balance support: No upper extremity supported, Feet supported Sitting balance-Leahy Scale: Good     Standing balance support: During  functional activity, No upper extremity supported, Bilateral upper extremity supported Standing balance-Leahy Scale: Fair                             ADL either performed or assessed with clinical judgement   ADL Overall ADL's : Needs assistance/impaired     Grooming: Set up;Sitting;Wash/dry hands                   Toilet Transfer: Supervision/safety;Ambulation;Rolling walker (2 wheels);Regular Toilet   Toileting- Clothing Manipulation and Hygiene: Supervision/safety;Sit to/from stand;Contact guard assist;Sitting/lateral lean Toileting - Clothing Manipulation Details (indicate cue type and reason): able to perform hygiene seated on toilet with setup assist. CGA for clothing mgmt in standing though no LOB.     Functional mobility during ADLs: Supervision/safety;Rolling walker (2 wheels)      Extremity/Trunk Assessment Upper Extremity Assessment Upper Extremity Assessment: Overall WFL for tasks assessed;Right hand dominant   Lower Extremity Assessment Lower Extremity Assessment: Defer to PT evaluation        Vision   Vision Assessment?: No apparent visual deficits   Perception     Praxis     Communication Communication Communication: No apparent difficulties   Cognition Arousal: Alert Behavior During Therapy: WFL for tasks assessed/performed Cognition: No apparent impairments                               Following commands: Intact        Cueing   Cueing Techniques: Verbal cues  Exercises      Shoulder Instructions  General Comments recevied on 2 L O2 with SpO2 94%, desats to 74% to sit EOB, requiring increase to 4 L to increase to 83%, 6 L to increase to 87%. able to walk to bathroom on 6 L O2 but desat to 74% requiring 8 L O2 to increase to 85%, up to 89% seated on toilet > 10 min. down to 80% with mobility from bathroom on 8 L O2, iincrease to 10 L with quick recovery to 91%. Began slow titration back down to 4 L O2 with  nursing present.    Pertinent Vitals/ Pain       Pain Assessment Pain Assessment: No/denies pain  Home Living                                          Prior Functioning/Environment              Frequency  Min 2X/week        Progress Toward Goals  OT Goals(current goals can now be found in the care plan section)  Progress towards OT goals: Progressing toward goals  Acute Rehab OT Goals Patient Stated Goal: be able to go home OT Goal Formulation: With patient Time For Goal Achievement: 09/28/23 Potential to Achieve Goals: Good ADL Goals Pt Will Perform Upper Body Dressing: with modified independence Pt Will Perform Lower Body Dressing: with modified independence Pt Will Transfer to Toilet: with modified independence Pt Will Perform Toileting - Clothing Manipulation and hygiene: with modified independence Additional ADL Goal #1: Pt will be able to tolerate 5 mins of continuous OOB activity while maintaining safety awareness and stable O2 levels to maximize activity tolerance and return to PLOF.  Plan      Co-evaluation                 AM-PAC OT "6 Clicks" Daily Activity     Outcome Measure   Help from another person eating meals?: None Help from another person taking care of personal grooming?: A Little Help from another person toileting, which includes using toliet, bedpan, or urinal?: A Little Help from another person bathing (including washing, rinsing, drying)?: A Little Help from another person to put on and taking off regular upper body clothing?: A Little Help from another person to put on and taking off regular lower body clothing?: A Little 6 Click Score: 19    End of Session Equipment Utilized During Treatment: Gait belt;Rolling walker (2 wheels);Oxygen   OT Visit Diagnosis: Unsteadiness on feet (R26.81);Other abnormalities of gait and mobility (R26.89);Muscle weakness (generalized) (M62.81)   Activity Tolerance Patient  tolerated treatment well   Patient Left in chair;with call bell/phone within reach;with chair alarm set   Nurse Communication          Time: [phone] OT Time Calculation (min): 50 min  Charges: OT General Charges $OT Visit: 1 Visit OT Treatments $Self Care/Home Management : 23-37 mins $Therapeutic Activity: 8-22 mins  Lawrence Pretty, OTR/L Acute Rehab Services Office: [phone]   Annabella Barr 09/25/2023, 9:03 AM

## 2023-09-26 DIAGNOSIS — J9601 Acute respiratory failure with hypoxia: Secondary | ICD-10-CM | POA: Diagnosis not present

## 2023-09-26 MED ORDER — LOSARTAN POTASSIUM 50 MG PO TABS
50.0000 mg | ORAL_TABLET | Freq: Every day | ORAL | Status: DC
  Administered 2023-09-26 – 2023-09-27 (×2): 50 mg via ORAL
  Filled 2023-09-26 (×2): qty 1

## 2023-09-26 MED ORDER — ACETAMINOPHEN 325 MG PO TABS
650.0000 mg | ORAL_TABLET | Freq: Four times a day (QID) | ORAL | Status: DC | PRN
  Administered 2023-09-26 – 2023-09-29 (×4): 650 mg via ORAL
  Filled 2023-09-26 (×4): qty 2

## 2023-09-26 MED ORDER — FUROSEMIDE 10 MG/ML IJ SOLN
40.0000 mg | Freq: Once | INTRAMUSCULAR | Status: AC
Start: 2023-09-26 — End: 2023-09-26
  Administered 2023-09-26: 40 mg via INTRAVENOUS
  Filled 2023-09-26: qty 4

## 2023-09-26 MED ORDER — ACETAMINOPHEN 650 MG RE SUPP: 650.0000 mg | Freq: Four times a day (QID) | RECTAL | Status: DC | PRN

## 2023-09-26 NOTE — Progress Notes (Signed)
   09/26/23 0909  Mobility  Activity Transferred from bed to chair (and Chair Exercises)  Level of Assistance Standby assist, set-up cues, supervision of patient - no hands on  Assistive Device Other (Comment) (armrests)  Range of Motion/Exercises Active;Right leg;Left leg  Activity Response Tolerated fair  Mobility Referral Yes  Mobility visit 1 Mobility  Mobility Specialist Start Time (ACUTE ONLY) 0909  Mobility Specialist Stop Time (ACUTE ONLY) 0936  Mobility Specialist Time Calculation (min) (ACUTE ONLY) 27 min   Mobility Specialist: Progress Note  Pre-Mobility:      HR 83, SpO2 96% 3.5L During Mobility:             SpO2 77-90% 3.5L Post-Mobility:    HR 88, SpO2 89% 3.5L  Pt agreeable to mobility session - received in bed. C/o SOB with coughing after transferring. Spo2 recovery over 5 mins, VSS. Returned to chair with all needs met - call bell within reach.  BLE Exercises: Leg Extensions (x20), Leg Raises (x20).  LLE Exercises: Ankle Pumps (x10). Toe Taps (x10).   Isla Mari, BS Mobility Specialist Please contact via SecureChat or  Rehab office at [phone].

## 2023-09-26 NOTE — Plan of Care (Signed)

## 2023-09-26 NOTE — Progress Notes (Signed)
 PROGRESS NOTE        PATIENT DETAILS Name: Shelly [name] Age: 73 y.o. Sex: female Date of Birth: 1950/10/28 Admit Date: 09/11/2023 Admitting Physician Josiah Nigh, MD ZOX:[id number], Corbett Desanctis, MD  Brief Summary: Patient is a 73 y.o.  female with history of dementia, right BKA, HTN, HLD, COPD-who was hospitalized from 3/26-4/2 for acute hypoxic respiratory failure secondary to rhinovirus/strep pneumonia infection-patient was stabilized-discharged on home oxygen -presented to the ED on 4/7 with worsening shortness of breath-found to be severely hypoxemic-CTA chest showed a small PE-she was started on heated high flow and subsequently admitted by the hospitalist service but due to worsening hypoxemia-she was transferred to the ICU for close monitoring.  She was stabilized and transferred back to [facility] on 4/10.  Significant events: 3/26-4/2>> hospitalization for hypoxia-rhinovirus/strep pneumo antigen positive-discharged on home O2 4/7>> worsening shortness of breath-worsening hypoxemia-admit to [facility] but later transferred to ICU 4/10>> transferred to [facility] 4/11>> O2 saturation in the 80s on 15 L of HFNC-CXR unchanged-switched to heated high flow-IV Lasix  started-prednisone  switched to Solu-Medrol . 4/14>> switch to salter high flow 4/15>> down to 5 L salter high flow 4/17>> Down to 2-3 L of oxygen -but still gets short of breath with minimal ambulation requiring around 8-10 L of oxygen .  Significant studies: 4/7>> CTA chest: Few scattered segmental PE medial left upper lobe/middle lobe-changing parenchymal lung opacities with areas of interstitial septal thickening and groundglass opacities. 4/8>> echo: EF 65-70% 4/8>> B/L lower extremity Doppler: Age indeterminate DVT left peroneal vein  Significant microbiology data: 4/7>> COVID/influenza/RSV PCR: Negative 4/7>> sputum culture: Pending 4/7>> blood culture: No growth  Procedures: None  Consults: PCCM  Subjective:  Patient in bed, appears comfortable, denies any headache, no fever, no chest pain or pressure, improving shortness of breath , no abdominal pain. No focal weakness.   Objective: Vitals: Blood pressure (!) 140/59, pulse 68, temperature 97.8 F (36.6 C), temperature source Oral, resp. rate 16, height 5\' 5"  (1.651 m), weight 65.3 kg, SpO2 97%.   Exam:  Awake Alert, No new F.N deficits, Normal affect [place].AT,PERRAL Supple Neck, No JVD,   Symmetrical Chest wall movement, Good air movement bilaterally, CTAB RRR,No Gallops, Rubs or new Murmurs,  +ve B.Sounds, Abd Soft, No tenderness,   No Cyanosis, Clubbing or edema    Assessment/Plan:  Acute hypoxic respiratory failure secondary to recent rhinovirus/strep pneumoniae-possible interstitial lung disease related to recent pneumonia-and now small PE Gradually improving-Down to 3-4 L of oxygen  at rest but still requires anywhere from 10-15 L of HFNC to maintain sats with ambulation. Completed IV Zosyn  x 7 days on 4/15 On prednisone  taper, gradually taper over 2-3 weeks.    She has shown significant improvement in the last 2 days upon sitting up in chair and using I-S and flutter valve for pulmonary toiletry, down to 3-4 L nasal cannula oxygen  from 6-8 at rest, upon ambulation at times she requires more than 5 L of oxygen .  Continue to monitor.  SpO2: 97 % O2 Flow Rate (L/min): 4 L/min FiO2 (%): 50 % (Titrated to 50%, O2 saturations 94%94)   Segmental left-sided pulmonary embolism with left peroneal vein DVT Continue Eliquis   Normocytic anemia Likely due to critical illness No evidence of blood loss Folate/vitamin B12 level stable. Follow CBC  Chronic HFpEF Euvolemic IV Lasix  as needed to maintain negative balance  COPD Not in exacerbation Continue bronchodilators  HLD Statin  HTN BP stable resume home ARB  Mood disorder Stable Continue Wellbutrin /Prozac   Dementia Delirium precautions Aricept   History of right BKA-uses  prosthesis  Code status:   Code Status: Full Code   DVT Prophylaxis: apixaban  (ELIQUIS ) tablet 5 mg    Family Communication: Daughter-Marissa-[phone]-updated 4/18  Disposition Plan: Status is: Inpatient Remains inpatient appropriate because: Severity of illness   Planned Discharge Destination:Home   Diet: Diet Order             DIET DYS 3 Room service appropriate? Yes with Assist; Fluid consistency: Thin  Diet effective now                    MEDICATIONS: Scheduled Meds:  apixaban   5 mg Oral BID   arformoterol   15 mcg Nebulization BID   budesonide  (PULMICORT ) nebulizer solution  0.5 mg Nebulization BID   buPROPion   300 mg Oral Daily   Chlorhexidine  Gluconate Cloth  6 each Topical Q0600   donepezil   10 mg Oral QHS   FLUoxetine   20 mg Oral Daily   guaiFENesin   600 mg Oral BID   melatonin  3 mg Oral QHS   montelukast   10 mg Oral QHS   pantoprazole   40 mg Oral Daily   predniSONE   40 mg Oral Q breakfast   revefenacin   175 mcg Nebulization Daily   rosuvastatin   10 mg Oral QHS   sodium chloride  flush  3 mL Intravenous Q12H   Continuous Infusions:  PRN Meds:.acetaminophen  **OR** acetaminophen , ipratropium-albuterol , magic mouthwash w/lidocaine , ondansetron  **OR** ondansetron  (ZOFRAN ) IV, polyvinyl alcohol    I have personally reviewed following labs and imaging studies  LABORATORY DATA: CBC: Recent Labs  Lab 09/23/23 0438 09/25/23 0436  WBC 12.9* 13.2*  NEUTROABS  --  9.3*  HGB 9.6* 10.1*  HCT 29.7* 31.4*  MCV 96.4 96.3  PLT 310 314    Basic Metabolic Panel: Recent Labs  Lab 09/20/23 0433 09/22/23 0432 09/23/23 0438 09/25/23 0436  NA 139 137 136 137  K 4.0 4.9 4.0 4.2  CL 103 100 96* 99  CO2 29 28 29  32  GLUCOSE 102* 83 84 81  BUN 18 20 16 14   CREATININE 0.59 0.59 0.57 0.62  CALCIUM  8.3* 8.6* 8.5* 8.9  MG  --   --   --  2.0    Urine analysis:    Component Value Date/Time   COLORURINE YELLOW 09/11/2023 0812   APPEARANCEUR HAZY (A)  09/11/2023 0812   LABSPEC 1.018 09/11/2023 0812   PHURINE 5.0 09/11/2023 0812   GLUCOSEU NEGATIVE 09/11/2023 0812   GLUCOSEU NEGATIVE 09/08/2010 1617   HGBUR NEGATIVE 09/11/2023 0812   BILIRUBINUR NEGATIVE 09/11/2023 0812   KETONESUR 20 (A) 09/11/2023 0812   PROTEINUR NEGATIVE 09/11/2023 0812   UROBILINOGEN 0.2 09/08/2010 1617   NITRITE NEGATIVE 09/11/2023 0812   LEUKOCYTESUR NEGATIVE 09/11/2023 0812    Sepsis Labs: Lactic Acid, Venous    Component Value Date/Time   LATICACIDVEN 1.0 09/12/2023 0511    MICROBIOLOGY: No results found for this or any previous visit (from the past 240 hours).   RADIOLOGY STUDIES/RESULTS: No results found.   LOS: 15 days   Signature  -    Lynnwood Sauer M.D on 09/26/2023 at 10:26 AM   -  To page go to [email or web address]

## 2023-09-26 NOTE — TOC Initial Note (Signed)
 Transition of Care [facility]) - Initial/Assessment Note    Patient Details  Name: Shelly [name] MRN: [id number] Date of Birth: [birth year]  Transition of Care [facility]) CM/SW Contact:    Jannice Mends, LCSW Phone Number: 09/26/2023, 3:07 PM  Clinical Narrative:                 CSW following for reduced O2 needs. Patient and family requesting SNF for rehab but SNF unable to accept patient above 5L O2.   Expected Discharge Plan: Skilled Nursing Facility Barriers to Discharge: Continued Medical Work up, SNF Pending bed offer   Patient Goals and CMS Choice Patient states their goals for this hospitalization and ongoing recovery are:: Rehab CMS Medicare.gov Compare Post Acute Care list provided to:: Patient Choice offered to / list presented to : Patient [place] ownership interest in [facility].provided to:: Patient    Expected Discharge Plan and Services In-house Referral: Clinical Social Work   Post Acute Care Choice: Skilled Nursing Facility Living arrangements for the past 2 months: Single Family Home                           HH Arranged: PT, OT, RN, Disease Management, Nurse's Aide   Date HH Agency Contacted: 09/13/23 Time HH Agency Contacted: 1000 Representative spoke with at [facility] Agency: Randel Buss  Prior Living Arrangements/Services Living arrangements for the past 2 months: Single Family Home Lives with:: Self Patient language and need for interpreter reviewed:: Yes        Need for Family Participation in Patient Care: Yes (Comment) Care giver support system in place?: Yes (comment)   Criminal Activity/Legal Involvement Pertinent to Current Situation/Hospitalization: No - Comment as needed  Activities of Daily Living   ADL Screening (condition at time of admission) Independently performs ADLs?: No Does the patient have a NEW difficulty with bathing/dressing/toileting/self-feeding that is expected to last >3 days?: No (needs assist) Does the patient have a  NEW difficulty with getting in/out of bed, walking, or climbing stairs that is expected to last >3 days?: No (needs assist) Does the patient have a NEW difficulty with communication that is expected to last >3 days?: No Is the patient deaf or have difficulty hearing?: No Does the patient have difficulty seeing, even when wearing glasses/contacts?: No Does the patient have difficulty concentrating, remembering, or making decisions?: No  Permission Sought/Granted Permission sought to share information with : Case Manager, Magazine features editor, Family Supports Permission granted to share information with : Yes, Verbal Permission Granted     Permission granted to share info w AGENCY: SNFs        Emotional Assessment Appearance:: Appears stated age     Orientation: : Oriented to Self, Oriented to Place, Oriented to  Time, Oriented to Situation Alcohol  / Substance Use: Not Applicable Psych Involvement: No (comment)  Admission diagnosis:  ARDS (adult respiratory distress syndrome) (HCC) [J80] Acute respiratory failure with hypoxia (HCC) [J96.01] Multifocal pneumonia [J18.9] Sepsis, due to unspecified organism, unspecified whether acute organ dysfunction present [facility]) [A41.9] Patient Active Problem List   Diagnosis Date Noted   Sepsis (HCC) 09/11/2023   Chest pain 09/11/2023   Acute pulmonary embolism (HCC) 09/11/2023   Diastolic dysfunction 09/11/2023   Alzheimer's dementia (HCC) 09/11/2023   ARDS (adult respiratory distress syndrome) (HCC) 09/11/2023   Acute respiratory failure with hypoxia (HCC) 09/04/2023   Multifocal pneumonia 08/30/2023   Anxiety and depression 05/19/2022   Acute bronchitis 04/11/2022  GERD (gastroesophageal reflux disease) 02/22/2022   Lumbar radiculopathy 09/14/2021   Upper airway cough syndrome 03/26/2019   Chronic pain 01/10/2019   MCI (mild cognitive impairment) 03/20/2017   Gait abnormality 03/20/2017   History of right below knee  amputation (HCC) 07/11/2016   Adenomatous colon polyp 05/10/2016   Coronary artery calcification 01/07/2016   Essential hypertension 01/07/2016   Back pain 09/09/2015   Abnormal CXR 05/16/2014   Cough variant asthma ? assoc with [facility] 05/15/2014   Acute post-operative pain 02/18/2012   Adiposity 02/17/2012   Does use eyeglasses 02/13/2012   H/O arthrodesis 10/21/2011   Fibromyalgia 04/11/2011   Hyperlipidemia 04/11/2011   Avascular necrosis of talus (HCC) 04/11/2011   Osteomyelitis of ankle (HCC) 04/11/2011   Leg edema 09/08/2010   PCP:  Faustina Hood, MD Pharmacy:   [facility] [phone] [place], [place] - [address] AT [facility] [address] [facility] [address] [place] [address] Phone: [phone] Fax: [phone]  [facility] - [place], [place] - [address] [place] [address] Phone: [phone] Fax: [phone]  [facility] [address] [place] [address] Phone: [phone] Fax: [phone]     Social Drivers of Health (SDOH) Social History: SDOH Screenings   Food Insecurity: No Food Insecurity (09/11/2023)  Housing: Low Risk  (09/11/2023)  Transportation Needs: No Transportation Needs (09/11/2023)  Utilities: Not At Risk (09/11/2023)  Social Connections: Moderately Isolated (09/11/2023)  Tobacco Use: Medium Risk (09/11/2023)   SDOH Interventions: Transportation Interventions: Intervention Not Indicated, Inpatient TOC, Patient Resources (Friends/Family)   Readmission Risk Interventions    09/12/2023    1:24 PM 09/05/2023    4:03 PM  Readmission Risk Prevention Plan  Transportation Screening Complete Complete  PCP or Specialist Appt within 5-7 Days  Complete  PCP or Specialist Appt within 3-5 Days Complete   Home Care Screening  Complete  Medication Review (RN CM)  Complete  HRI or Home Care Consult Complete   Social Work Consult for Recovery Care Planning/Counseling Complete    Palliative Care Screening Not Applicable   Medication Review Oceanographer) Referral to Pharmacy

## 2023-09-26 NOTE — Progress Notes (Signed)
 Physical Therapy Treatment Patient Details Name: Shelly [name] MRN: [id number] DOB: Sep 18, 1950 Today's Date: 09/26/2023   History of Present Illness 73 y.o. female admitted 09/11/23 with hypoxemia and PE. PMhx: Admission 3/26-4/2 with N/V, falls, CAP. Rt BKA, HTN, HLD, mild early Alzheimer's dementia, asthma, fibromyalgia, anxiety.    PT Comments  Function continues to be limited by rapid desaturation of O2 with minimal exertion. Required 5L to lean forward and donne prosthesis, drops to 82% on 3.5L which is stable at rest. Desats to 79% in standing and required 8L to recover while sitting. Increased dyspnea and coughing with functional tasks. Further education for breathing techniques, RPE awareness with activities, transfer safety, and progression as tolerated. Patient will continue to benefit from skilled physical therapy services to further improve independence with functional mobility. She is interested in going to SNF as she states there is no assist available at home and she is too fatigued to walk/care for herself at home. Patient will benefit from continued inpatient follow up therapy, <3 hours/day. Will progress as tolerated.    If plan is discharge home, recommend the following: Assist for transportation;Help with stairs or ramp for entrance;Assistance with cooking/housework;A little help with walking and/or transfers;A little help with bathing/dressing/bathroom   Can travel by private vehicle     Yes  Equipment Recommendations  None recommended by PT    Recommendations for Other Services       Precautions / Restrictions Precautions Precautions: Fall Recall of Precautions/Restrictions: Intact Precaution/Restrictions Comments: watch SpO2, check orthostatics Restrictions Weight Bearing Restrictions Per Provider Order: No     Mobility  Bed Mobility               General bed mobility comments: in recliner    Transfers Overall transfer level: Needs  assistance Equipment used: Rolling walker (2 wheels) Transfers: Sit to/from Stand Sit to Stand: Contact guard assist           General transfer comment: CGA for safety to stand from recliner, cues for hand placement, prosthesis donned. SpO2 dropped to 79% while standing. Required seated rest to recover, and increase in supplemental O2 to 8L. Cues for pursed lip breathing.    Ambulation/Gait               General Gait Details: Deferred due to drop in SpO2 79% on 6L; dyspneic with coughing spell.   Stairs             Wheelchair Mobility     Tilt Bed    Modified Rankin (Stroke Patients Only)       Balance Overall balance assessment: Needs assistance Sitting-balance support: No upper extremity supported, Feet supported Sitting balance-Leahy Scale: Good Sitting balance - Comments: Pt able to don RLE prosthesis independently EOB. but SpO2 drops to 82% on 3.5L; Required 5L to sit forward edge of chair at 89% SpO2.   Standing balance support: Bilateral upper extremity supported, Reliant on assistive device for balance Standing balance-Leahy Scale: Poor Standing balance comment: BIL UEs to stand today. Trunk flexed, SOB.                            Communication Communication Communication: No apparent difficulties  Cognition Arousal: Alert Behavior During Therapy: WFL for tasks assessed/performed   PT - Cognitive impairments: History of cognitive impairments                       PT - Cognition  Comments: mild early Alzheimer's dementia per prior notes. Following commands: Intact      Cueing Cueing Techniques: Verbal cues  Exercises General Exercises - Lower Extremity Hip ABduction/ADduction: Strengthening, Both, Seated, 10 reps (desats to low 80s on 5-6L)    General Comments General comments (skin integrity, edema, etc.): SpO2 91% on 3.5 L at rest; drops to 82% just leaning forward to donne prosthesis and develops coughing spells. She  can donne prosthesis on 5L at 89% SpO2 but dropped to 79% upon standing with 5L, and 82% on 6L. Good pleth, checked multiple locations. Required 8L and seated rest to recover breathing and SpO2. Cues for breathing techniques. MD notified.      Pertinent Vitals/Pain Pain Assessment Pain Assessment: No/denies pain    Home Living                          Prior Function            PT Goals (current goals can now be found in the care plan section) Acute Rehab PT Goals Patient Stated Goal: Unsure PT Goal Formulation: With patient/family Time For Goal Achievement: 10/04/23 Potential to Achieve Goals: Fair Progress towards PT goals: Progressing toward goals    Frequency    Min 2X/week      PT Plan      Co-evaluation              AM-PAC PT "6 Clicks" Mobility   Outcome Measure  Help needed turning from your back to your side while in a flat bed without using bedrails?: None Help needed moving from lying on your back to sitting on the side of a flat bed without using bedrails?: A Little Help needed moving to and from a bed to a chair (including a wheelchair)?: A Little Help needed standing up from a chair using your arms (e.g., wheelchair or bedside chair)?: A Little Help needed to walk in hospital room?: A Little Help needed climbing 3-5 steps with a railing? : Total 6 Click Score: 17    End of Session Equipment Utilized During Treatment: Oxygen  Activity Tolerance: Treatment limited secondary to medical complications (Comment) (desat with minimal activity) Patient left: in chair;with call bell/phone within reach;with chair alarm set Nurse Communication: Mobility status PT Visit Diagnosis: Other abnormalities of gait and mobility (R26.89);Muscle weakness (generalized) (M62.81);Difficulty in walking, not elsewhere classified (R26.2)     Time: 1610-9604 PT Time Calculation (min) (ACUTE ONLY): 32 min  Charges:    $Therapeutic Activity: 23-37 mins PT  General Charges $$ ACUTE PT VISIT: 1 Visit                     Jory Ng, PT, DPT [facility]  Rehabilitation Services Physical Therapist Office: [phone] Website: [email or web address]    Shelly [name] 09/26/2023, 1:03 PM

## 2023-09-27 ENCOUNTER — Inpatient Hospital Stay (HOSPITAL_COMMUNITY)

## 2023-09-27 ENCOUNTER — Encounter: Admitting: Physical Therapy

## 2023-09-27 DIAGNOSIS — J9601 Acute respiratory failure with hypoxia: Secondary | ICD-10-CM | POA: Diagnosis not present

## 2023-09-27 LAB — GLUCOSE, CAPILLARY
Glucose-Capillary: 121 mg/dL — ABNORMAL HIGH (ref 70–99)
Glucose-Capillary: 152 mg/dL — ABNORMAL HIGH (ref 70–99)
Glucose-Capillary: 143 mg/dL — ABNORMAL HIGH (ref 70–99)
Glucose-Capillary: 266 mg/dL — ABNORMAL HIGH (ref 70–99)

## 2023-09-27 MED ORDER — MIDODRINE HCL 5 MG PO TABS
5.0000 mg | ORAL_TABLET | Freq: Three times a day (TID) | ORAL | Status: DC
  Administered 2023-09-27 – 2023-09-28 (×3): 5 mg via ORAL
  Filled 2023-09-27 (×3): qty 1

## 2023-09-27 MED ORDER — FUROSEMIDE 10 MG/ML IJ SOLN
20.0000 mg | Freq: Once | INTRAMUSCULAR | Status: AC
  Administered 2023-09-27: 20 mg via INTRAVENOUS
  Filled 2023-09-27: qty 2

## 2023-09-27 MED ORDER — LOSARTAN POTASSIUM 50 MG PO TABS
25.0000 mg | ORAL_TABLET | Freq: Every day | ORAL | Status: DC
Start: 2023-09-28 — End: 2023-09-27

## 2023-09-27 MED ORDER — SPIRONOLACTONE 25 MG PO TABS
25.0000 mg | ORAL_TABLET | Freq: Once | ORAL | Status: AC
  Administered 2023-09-27: 25 mg via ORAL
  Filled 2023-09-27: qty 1

## 2023-09-27 NOTE — Plan of Care (Signed)
 Pt has rested quietly throughout the night with no distress noted. Alert and oriented. On O2 6LHFNC. SR on the monitor. Purewick intact to suction. Medicated once for pain with relief noted. No other complaints voiced.     Problem: Education: Goal: Knowledge of General Education information will improve Description: Including pain rating scale, medication(s)/side effects and non-pharmacologic comfort measures Outcome: Progressing   Problem: Health Behavior/Discharge Planning: Goal: Ability to manage health-related needs will improve Outcome: Progressing   Problem: Clinical Measurements: Goal: Respiratory complications will improve Outcome: Progressing Goal: Cardiovascular complication will be avoided Outcome: Progressing   Problem: Nutrition: Goal: Adequate nutrition will be maintained Outcome: Progressing   Problem: Pain Managment: Goal: General experience of comfort will improve and/or be controlled Outcome: Progressing

## 2023-09-27 NOTE — [place] FL2 (Signed)
 [place]  MEDICAID FL2 LEVEL OF CARE FORM     IDENTIFICATION  Patient Name: Shelly [name] Birthdate: September 23, 1950 Sex: female Admission Date (Current Location): 09/11/2023  [facility] and [place] Number:  Producer, television/film/video and Address:  [facility], [address] [place], [place] [address]      Provider Number: [id number]  Attending Physician Name and Address:  Cala Castleman, MD  Relative Name and Phone Number:       Current Level of Care: Hospital Recommended Level of Care: Skilled Nursing Facility Prior Approval Number:    Date Approved/Denied:   PASRR Number: [id number]  Discharge Plan: SNF    Current Diagnoses: Patient Active Problem List   Diagnosis Date Noted   Sepsis (HCC) 09/11/2023   Chest pain 09/11/2023   Acute pulmonary embolism (HCC) 09/11/2023   Diastolic dysfunction 09/11/2023   Alzheimer's dementia (HCC) 09/11/2023   ARDS (adult respiratory distress syndrome) (HCC) 09/11/2023   Acute respiratory failure with hypoxia (HCC) 09/04/2023   Multifocal pneumonia 08/30/2023   Anxiety and depression 05/19/2022   Acute bronchitis 04/11/2022   GERD (gastroesophageal reflux disease) 02/22/2022   Lumbar radiculopathy 09/14/2021   Upper airway cough syndrome 03/26/2019   Chronic pain 01/10/2019   MCI (mild cognitive impairment) 03/20/2017   Gait abnormality 03/20/2017   History of right below knee amputation (HCC) 07/11/2016   Adenomatous colon polyp 05/10/2016   Coronary artery calcification 01/07/2016   Essential hypertension 01/07/2016   Back pain 09/09/2015   Abnormal CXR 05/16/2014   Cough variant asthma ? assoc with [facility] 05/15/2014   Acute post-operative pain 02/18/2012   Adiposity 02/17/2012   Does use eyeglasses 02/13/2012   H/O arthrodesis 10/21/2011   Fibromyalgia 04/11/2011   Hyperlipidemia 04/11/2011   Avascular necrosis of talus (HCC) 04/11/2011   Osteomyelitis of ankle (HCC) 04/11/2011   Leg edema  09/08/2010    Orientation RESPIRATION BLADDER Height & Weight     Self, Time, Situation, Place  O2 (2-5L nasal cannula upon ambulation) Continent Weight: 147 lb 11.3 oz (67 kg) Height:  5\' 5"  (165.1 cm)  BEHAVIORAL SYMPTOMS/MOOD NEUROLOGICAL BOWEL NUTRITION STATUS      Continent Diet (See dc summary)  AMBULATORY STATUS COMMUNICATION OF NEEDS Skin   Limited Assist Verbally                         Personal Care Assistance Level of Assistance  Bathing, Feeding, Dressing Bathing Assistance: Limited assistance Feeding assistance: Independent Dressing Assistance: Limited assistance     Functional Limitations Info  Sight Sight Info: Impaired        SPECIAL CARE FACTORS FREQUENCY  PT (By licensed PT), OT (By licensed OT)     PT Frequency: 5x/week OT Frequency: 5x/week            Contractures Contractures Info: Not present    Additional Factors Info  Code Status, Allergies, Psychotropic Code Status Info: Full Allergies Info: Delsym (Dextromethorphan) Psychotropic Info: See dc summary         Current Medications (09/27/2023):  This is the current hospital active medication list Current Facility-Administered Medications  Medication Dose Route Frequency Provider Last Rate Last Admin   acetaminophen  (TYLENOL ) tablet 650 mg  650 mg Oral Q6H PRN Singh, Prashant K, MD   650 mg at 09/26/23 2215   Or   acetaminophen  (TYLENOL ) suppository 650 mg  650 mg Rectal Q6H PRN Singh, Prashant K, MD  apixaban  (ELIQUIS ) tablet 5 mg  5 mg Oral BID Mannam, Praveen, MD   5 mg at 09/27/23 0915   arformoterol  (BROVANA ) nebulizer solution 15 mcg  15 mcg Nebulization BID Smith, Rondell A, MD   15 mcg at 09/27/23 0758   budesonide  (PULMICORT ) nebulizer solution 0.5 mg  0.5 mg Nebulization BID Smith, Rondell A, MD   0.5 mg at 09/27/23 0454   buPROPion  (WELLBUTRIN  XL) 24 hr tablet 300 mg  300 mg Oral Daily Payne, John D, PA-C   300 mg at 09/27/23 0981   Chlorhexidine  Gluconate Cloth 2 %  PADS 6 each  6 each Topical Q0600 Josiah Nigh, MD   6 each at 09/27/23 1914   donepezil  (ARICEPT ) tablet 10 mg  10 mg Oral QHS Smith, Rondell A, MD   10 mg at 09/26/23 2209   FLUoxetine  (PROZAC ) capsule 20 mg  20 mg Oral Daily Smith, Rondell A, MD   20 mg at 09/27/23 0915   guaiFENesin  (MUCINEX ) 12 hr tablet 600 mg  600 mg Oral BID Smith, Rondell A, MD   600 mg at 09/27/23 0915   ipratropium-albuterol  (DUONEB) 0.5-2.5 (3) MG/3ML nebulizer solution 3 mL  3 mL Nebulization Q4H PRN Payne, John D, PA-C   3 mL at 09/25/23 1516   magic mouthwash w/lidocaine   5 mL Oral QID PRN Corrinne Din, MD       melatonin tablet 3 mg  3 mg Oral QHS Payne, John D, PA-C   3 mg at 09/26/23 2209   midodrine  (PROAMATINE ) tablet 5 mg  5 mg Oral TID WC Singh, Prashant K, MD   5 mg at 09/27/23 1009   montelukast  (SINGULAIR ) tablet 10 mg  10 mg Oral QHS Payne, John D, PA-C   10 mg at 09/26/23 2209   ondansetron  (ZOFRAN ) tablet 4 mg  4 mg Oral Q6H PRN Lena Qualia, MD       Or   ondansetron  (ZOFRAN ) injection 4 mg  4 mg Intravenous Q6H PRN Lena Qualia, MD       pantoprazole  (PROTONIX ) EC tablet 40 mg  40 mg Oral Daily Marybelle Smiling, RPH   40 mg at 09/27/23 7829   polyvinyl alcohol  (LIQUIFILM TEARS) 1.4 % ophthalmic solution 1 drop  1 drop Both Eyes PRN Cala Castleman, MD       predniSONE  (DELTASONE ) tablet 40 mg  40 mg Oral Q breakfast Singh, Prashant K, MD   40 mg at 09/27/23 0915   revefenacin  (YUPELRI ) nebulizer solution 175 mcg  175 mcg Nebulization Daily Mannam, Praveen, MD   175 mcg at 09/27/23 0758   rosuvastatin  (CRESTOR ) tablet 10 mg  10 mg Oral QHS Smith, Rondell A, MD   10 mg at 09/26/23 2209   sodium chloride  flush (NS) 0.9 % injection 3 mL  3 mL Intravenous Q12H Manny Sees A, MD   3 mL at 09/27/23 0915     Discharge Medications: Please see discharge summary for a list of discharge medications.  Relevant Imaging Results:  Relevant Lab Results:   Additional Information SSn: [id number] [address] Cadi Rhinehart, LCSW

## 2023-09-27 NOTE — Progress Notes (Addendum)
   09/27/23 1532  Mobility  Activity Dangled on edge of bed;Stood at bedside (Seated Exercises)  Level of Assistance Standby assist, set-up cues, supervision of patient - no hands on  Assistive Device Front wheel walker  Range of Motion/Exercises Right leg;Left leg;Active  Activity Response Tolerated fair  Mobility Referral Yes  Mobility visit 1 Mobility  Mobility Specialist Start Time (ACUTE ONLY) 1532  Mobility Specialist Stop Time (ACUTE ONLY) 1550  Mobility Specialist Time Calculation (min) (ACUTE ONLY) 18 min   Mobility Specialist: Progress Note   Pre-Mobility:      SpO2 96% 3.5L During Mobility: SpO2 87-90% 3.5L Post-Mobility:    SpO2 92% 3.5L   Pt agreeable to mobility session - received in bed. C/o SOB with coughing after sitting EOB, VSS. Further mobility deferred by pt. Returned to chair with all needs met - call bell within reach. Sister in [place] present.    BLE Exercises: Leg Extensions (x20), Leg Raises (x20).  LLE Exercises: Ankle Pumps (x10), Toe Taps (x10).    Shelly [name], BS Mobility Specialist Please contact via SecureChat or  Rehab office at [phone].

## 2023-09-27 NOTE — Progress Notes (Addendum)
 PROGRESS NOTE        PATIENT DETAILS Name: Shelly [name] Age: 73 y.o. Sex: female Date of Birth: 25-Feb-1951 Admit Date: 09/11/2023 Admitting Physician Josiah Nigh, MD EAV:[id number], Corbett Desanctis, MD  Brief Summary: Patient is a 73 y.o.  female with history of dementia, right BKA, HTN, HLD, COPD-who was hospitalized from 3/26-4/2 for acute hypoxic respiratory failure secondary to rhinovirus/strep pneumonia infection-patient was stabilized-discharged on home oxygen -presented to the ED on 4/7 with worsening shortness of breath-found to be severely hypoxemic-CTA chest showed a small PE-she was started on heated high flow and subsequently admitted by the hospitalist service but due to worsening hypoxemia-she was transferred to the ICU for close monitoring.  She was stabilized and transferred back to [facility] on 4/10.  Significant events: 3/26-4/2>> hospitalization for hypoxia-rhinovirus/strep pneumo antigen positive-discharged on home O2 4/7>> worsening shortness of breath-worsening hypoxemia-admit to [facility] but later transferred to ICU 4/10>> transferred to [facility] 4/11>> O2 saturation in the 80s on 15 L of HFNC-CXR unchanged-switched to heated high flow-IV Lasix  started-prednisone  switched to Solu-Medrol . 4/14>> switch to salter high flow 4/15>> down to 5 L salter high flow 4/17>> Down to 2-3 L of oxygen -but still gets short of breath with minimal ambulation requiring around 8-10 L of oxygen .  Significant studies: 4/7>> CTA chest: Few scattered segmental PE medial left upper lobe/middle lobe-changing parenchymal lung opacities with areas of interstitial septal thickening and groundglass opacities. 4/8>> echo: EF 65-70% 4/8>> B/L lower extremity Doppler: Age indeterminate DVT left peroneal vein  Significant microbiology data: 4/7>> COVID/influenza/RSV PCR: Negative 4/7>> sputum culture: Pending 4/7>> blood culture: No growth  Procedures: None  Consults: PCCM  Subjective:  Patient in bed, appears comfortable, denies any headache, no fever, no chest pain or pressure, improved shortness of breath , no abdominal pain. No new focal weakness.    Objective: Vitals: Blood pressure (!) 97/58, pulse 84, temperature 97.7 F (36.5 C), temperature source Oral, resp. rate 18, height 5\' 5"  (1.651 m), weight 67 kg, SpO2 91%.   Exam:  Awake Alert, No new F.N deficits, Normal affect [place].AT,PERRAL Supple Neck, No JVD,   Symmetrical Chest wall movement, Good air movement bilaterally, few rales RRR,No Gallops, Rubs or new Murmurs,  +ve B.Sounds, Abd Soft, No tenderness,   No Cyanosis, Clubbing or edema    Assessment/Plan:  Acute hypoxic respiratory failure secondary to recent rhinovirus/strep pneumoniae-possible interstitial lung disease related to recent pneumonia-and now small PE Gradually improving-Down to 3-4 L of oxygen  at rest but still requires anywhere from 10-15 L of HFNC to maintain sats with ambulation. Completed IV Zosyn  x 7 days on 4/15 On prednisone  taper, gradually taper over 2-3 weeks.    She has shown significant improvement in the last 2 days upon sitting up in chair and using I-S and flutter valve for pulmonary toiletry.  Patient at rest now requiring 3 L nasal cannula oxygen , upon ambulation pulse ox drops to 86% and she requires 5 L of nasal cannula oxygen  which gets better after 10 to 15 minutes of rest, she is saturating between 90 to 95% on 3 L nasal cannula oxygen  at rest.  SpO2: 91 % O2 Flow Rate (L/min): 3 L/min FiO2 (%): 50 % (Titrated to 50%, O2 saturations 94%94)   Segmental left-sided pulmonary embolism with left peroneal vein DVT  Continue Eliquis   Normocytic anemia Likely due to critical illness No  evidence of blood loss Folate/vitamin B12 level stable. Follow CBC  Chronic HFpEF Euvolemic Lasix  being used as needed will give a dose on 09/27/2023.  COPD Not in exacerbation Continue bronchodilators  HLD Statin  HTN BP  soft, hold ARB.  Mood disorder Stable Continue Wellbutrin /Prozac   Dementia Delirium precautions Aricept   History of right BKA- uses prosthesis  Code status:   Code Status: Full Code   DVT Prophylaxis: apixaban  (ELIQUIS ) tablet 5 mg    Family Communication: Daughter-[phone]-updated 4/18  Disposition Plan: Status is: Inpatient Remains inpatient appropriate because: Severity of illness   Planned Discharge Destination:Home   Diet: Diet Order             DIET DYS 3 Room service appropriate? Yes with Assist; Fluid consistency: Thin  Diet effective now                    MEDICATIONS: Scheduled Meds:  apixaban   5 mg Oral BID   arformoterol   15 mcg Nebulization BID   budesonide  (PULMICORT ) nebulizer solution  0.5 mg Nebulization BID   buPROPion   300 mg Oral Daily   Chlorhexidine  Gluconate Cloth  6 each Topical Q0600   donepezil   10 mg Oral QHS   FLUoxetine   20 mg Oral Daily   furosemide   20 mg Intravenous Once   guaiFENesin   600 mg Oral BID   melatonin  3 mg Oral QHS   midodrine   5 mg Oral TID WC   montelukast   10 mg Oral QHS   pantoprazole   40 mg Oral Daily   predniSONE   40 mg Oral Q breakfast   revefenacin   175 mcg Nebulization Daily   rosuvastatin   10 mg Oral QHS   sodium chloride  flush  3 mL Intravenous Q12H   spironolactone   25 mg Oral Once   Continuous Infusions:  PRN Meds:.acetaminophen  **OR** acetaminophen , ipratropium-albuterol , magic mouthwash w/lidocaine , ondansetron  **OR** ondansetron  (ZOFRAN ) IV, polyvinyl alcohol    I have personally reviewed following labs and imaging studies  LABORATORY DATA: CBC: Recent Labs  Lab 09/23/23 0438 09/25/23 0436  WBC 12.9* 13.2*  NEUTROABS  --  9.3*  HGB 9.6* 10.1*  HCT 29.7* 31.4*  MCV 96.4 96.3  PLT 310 314    Basic Metabolic Panel: Recent Labs  Lab 09/22/23 0432 09/23/23 0438 09/25/23 0436  NA 137 136 137  K 4.9 4.0 4.2  CL 100 96* 99  CO2 28 29 32  GLUCOSE 83 84 81  BUN 20  16 14   CREATININE 0.59 0.57 0.62  CALCIUM  8.6* 8.5* 8.9  MG  --   --  2.0    Urine analysis:    Component Value Date/Time   COLORURINE YELLOW 09/11/2023 0812   APPEARANCEUR HAZY (A) 09/11/2023 0812   LABSPEC 1.018 09/11/2023 0812   PHURINE 5.0 09/11/2023 0812   GLUCOSEU NEGATIVE 09/11/2023 0812   GLUCOSEU NEGATIVE 09/08/2010 1617   HGBUR NEGATIVE 09/11/2023 0812   BILIRUBINUR NEGATIVE 09/11/2023 0812   KETONESUR 20 (A) 09/11/2023 0812   PROTEINUR NEGATIVE 09/11/2023 0812   UROBILINOGEN 0.2 09/08/2010 1617   NITRITE NEGATIVE 09/11/2023 0812   LEUKOCYTESUR NEGATIVE 09/11/2023 0812    Sepsis Labs: Lactic Acid, Venous    Component Value Date/Time   LATICACIDVEN 1.0 09/12/2023 0511    MICROBIOLOGY: No results found for this or any previous visit (from the past 240 hours).   RADIOLOGY STUDIES/RESULTS: No results found.   LOS: 16 days   Signature  -    Lynnwood Sauer M.D on 09/27/2023 at  9:30 AM   -  To page go to [email or web address]

## 2023-09-27 NOTE — Progress Notes (Signed)
 Pt has PRN BIPAP orders, no distress noted at this time.

## 2023-09-27 NOTE — TOC Progression Note (Addendum)
 Transition of Care [facility]) - Progression Note    Patient Details  Name: Shelly [name] MRN: [id number] Date of Birth: 1950/08/28  [facility] CM/SW Contact  Jannice Mends, LCSW Phone Number: 09/27/2023, 3:17 PM  Clinical Narrative:    CSW met with patient and provided SNF bed offers and Medicare ratings. She consented for CSW to contact her daughters for assistance on choosing a facility. Her prosthesis is at bedside.   CSW spoke with patient's daughter, Connell Degree (left vm for [employer]). CSW emailed her SNF bed offers securely to [email or web address].    Expected Discharge Plan: Skilled Nursing Facility Barriers to Discharge: Continued Medical Work up, SNF Pending bed offer  Expected Discharge Plan and Services In-house Referral: Clinical Social Work   Post Acute Care Choice: Skilled Nursing Facility Living arrangements for the past 2 months: Single Family Home                           HH Arranged: PT, OT, RN, Disease Management, Nurse's Aide   Date HH Agency Contacted: 09/13/23 Time HH Agency Contacted: 1000 Representative spoke with at [facility] Agency: Randel Buss   Social Determinants of Health (SDOH) Interventions SDOH Screenings   Food Insecurity: No Food Insecurity (09/11/2023)  Housing: Low Risk  (09/11/2023)  Transportation Needs: No Transportation Needs (09/11/2023)  Utilities: Not At Risk (09/11/2023)  Social Connections: Moderately Isolated (09/11/2023)  Tobacco Use: Medium Risk (09/11/2023)    Readmission Risk Interventions    09/12/2023    1:24 PM 09/05/2023    4:03 PM  Readmission Risk Prevention Plan  Transportation Screening Complete Complete  PCP or Specialist Appt within 5-7 Days  Complete  PCP or Specialist Appt within 3-5 Days Complete   Home Care Screening  Complete  Medication Review (RN CM)  Complete  HRI or Home Care Consult Complete   Social Work Consult for Recovery Care Planning/Counseling Complete   Palliative Care Screening Not Applicable    Medication Review Ocean[facility]) Referral to Pharmacy

## 2023-09-28 DIAGNOSIS — J9601 Acute respiratory failure with hypoxia: Secondary | ICD-10-CM | POA: Diagnosis not present

## 2023-09-28 LAB — BASIC METABOLIC PANEL WITH GFR
Anion gap: 11 (ref 5–15)
BUN: 20 mg/dL (ref 8–23)
CO2: 28 mmol/L (ref 22–32)
Calcium: 8.9 mg/dL (ref 8.9–10.3)
Chloride: 96 mmol/L — ABNORMAL LOW (ref 98–111)
Creatinine, Ser: 0.69 mg/dL (ref 0.44–1.00)
GFR, Estimated: >60 mL/min (ref >60)
Glucose, Bld: 92 mg/dL (ref 70–99)
Potassium: 3.9 mmol/L (ref 3.5–5.1)
Sodium: 135 mmol/L (ref 135–145)

## 2023-09-28 LAB — GLUCOSE, CAPILLARY
Glucose-Capillary: 91 mg/dL (ref 70–99)
Glucose-Capillary: 141 mg/dL — ABNORMAL HIGH (ref 70–99)
Glucose-Capillary: 186 mg/dL — ABNORMAL HIGH (ref 70–99)
Glucose-Capillary: 204 mg/dL — ABNORMAL HIGH (ref 70–99)

## 2023-09-28 LAB — CBC WITH DIFFERENTIAL/PLATELET
Abs Immature Granulocytes: 0.16 10*3/uL — ABNORMAL HIGH (ref 0.00–0.07)
Basophils Absolute: 0 10*3/uL (ref 0.0–0.1)
Basophils Relative: 0 %
Eosinophils Absolute: 0.1 10*3/uL (ref 0.0–0.5)
Eosinophils Relative: 1 %
HCT: 33.4 % — ABNORMAL LOW (ref 36.0–46.0)
Hemoglobin: 11 g/dL — ABNORMAL LOW (ref 12.0–15.0)
Immature Granulocytes: 1 %
Lymphocytes Relative: 14 %
Lymphs Abs: 2.3 10*3/uL (ref 0.7–4.0)
MCH: 31.4 pg (ref 26.0–34.0)
MCHC: 32.9 g/dL (ref 30.0–36.0)
MCV: 95.4 fL (ref 80.0–100.0)
Monocytes Absolute: 0.9 10*3/uL (ref 0.1–1.0)
Monocytes Relative: 6 %
Neutro Abs: 12.6 10*3/uL — ABNORMAL HIGH (ref 1.7–7.7)
Neutrophils Relative %: 78 %
Platelets: 322 10*3/uL (ref 150–400)
RBC: 3.5 MIL/uL — ABNORMAL LOW (ref 3.87–5.11)
RDW: 13.9 % (ref 11.5–15.5)
WBC: 16.1 10*3/uL — ABNORMAL HIGH (ref 4.0–10.5)
nRBC: 0 % (ref 0.0–0.2)

## 2023-09-28 LAB — MAGNESIUM: Magnesium: 1.9 mg/dL (ref 1.7–2.4)

## 2023-09-28 LAB — BRAIN NATRIURETIC PEPTIDE: B Natriuretic Peptide: 20.9 pg/mL (ref 0.0–100.0)

## 2023-09-28 MED ORDER — MIDODRINE HCL 5 MG PO TABS
10.0000 mg | ORAL_TABLET | Freq: Three times a day (TID) | ORAL | Status: DC
  Administered 2023-09-28 – 2023-09-30 (×6): 10 mg via ORAL
  Filled 2023-09-28 (×7): qty 2

## 2023-09-28 MED ORDER — POTASSIUM CHLORIDE CRYS ER 20 MEQ PO TBCR
40.0000 meq | EXTENDED_RELEASE_TABLET | Freq: Once | ORAL | Status: AC
  Administered 2023-09-28: 40 meq via ORAL
  Filled 2023-09-28: qty 2

## 2023-09-28 MED ORDER — FUROSEMIDE 20 MG PO TABS
20.0000 mg | ORAL_TABLET | Freq: Once | ORAL | Status: AC
  Administered 2023-09-28: 20 mg via ORAL
  Filled 2023-09-28: qty 1

## 2023-09-28 NOTE — Progress Notes (Signed)
 Physical Therapy Treatment Patient Details Name: Modest Draeger MRN: [id number] DOB: 10/03/1950 Today's Date: 09/28/2023   History of Present Illness 73 y.o. female admitted 09/11/23 with hypoxemia and PE. PMhx: Admission 3/26-4/2 with N/V, falls, CAP. Rt BKA, HTN, HLD, mild early Alzheimer's dementia, asthma, fibromyalgia, anxiety.    PT Comments  Tolerated a bit better today. Seated sats a bit better but still with rapid drop in SpO2 with out of bed mobility. Ambulated 25 in room today with RW and CGA. Reviewed LE exercises on 6L, desats to 87% but improves within 1 min rest between sets. Pt feeling a bit more encouraged.  On 3.5L at rest in bed, BP 104/53. Sitting EOB 94% with 6L BP 97/57 HR 93. No dizziness. Gait training with sats dropping to 79% but improved to 88% within 2.5 minutes upon sitting with breathing techniques while still on 6L. HR returned to 107, from 130 at peak while ambulating.    Patient will benefit from continued inpatient follow up therapy, <3 hours/day    If plan is discharge home, recommend the following: Assist for transportation;Help with stairs or ramp for entrance;Assistance with cooking/housework;A little help with walking and/or transfers;A little help with bathing/dressing/bathroom   Can travel by private vehicle     Yes  Equipment Recommendations  None recommended by PT    Recommendations for Other Services       Precautions / Restrictions Precautions Precautions: Fall Recall of Precautions/Restrictions: Intact Precaution/Restrictions Comments: watch SpO2, check orthostatics Restrictions Weight Bearing Restrictions Per Provider Order: No     Mobility  Bed Mobility Overal bed mobility: Needs Assistance Bed Mobility: Supine to Sit     Supine to sit: Supervision, HOB elevated, Used rails     General bed mobility comments: Supervision for safety, line/lead management. No coughing spells this time. BP 97/57 HR 93 sitting EOB.     Transfers Overall transfer level: Needs assistance Equipment used: Rolling walker (2 wheels) Transfers: Sit to/from Stand Sit to Stand: Contact guard assist           General transfer comment: CGA for safety to stand, SpO2 90% upon standing at 6L supplemental O2, mild coughing spell.    Ambulation/Gait Ambulation/Gait assistance: Contact guard assist Gait Distance (Feet): 25 Feet Assistive device: Rolling walker (2 wheels) Gait Pattern/deviations: Step-through pattern, Decreased stride length, Trunk flexed Gait velocity: dec Gait velocity interpretation: <1.8 ft/sec, indicate of risk for recurrent falls   General Gait Details: Cues for technique, RW placement, assisted with line/lead management. Educated on pacing, breathing techniques, and energy conservation techniques. Mild coughing throughout, sats began to drop rapidly, nadir at 79% on 6L, increased dyspnea sat down to recover.   Stairs             Wheelchair Mobility     Tilt Bed    Modified Rankin (Stroke Patients Only)       Balance Overall balance assessment: Needs assistance Sitting-balance support: No upper extremity supported, Feet supported Sitting balance-Leahy Scale: Good Sitting balance - Comments: Sat EOB with good control, donned RLE prosthesis. 6L maintains 94% today.   Standing balance support: Bilateral upper extremity supported, Reliant on assistive device for balance Standing balance-Leahy Scale: Poor Standing balance comment: BIL UEs to stand today. Trunk flexed, SOB.                            Communication Communication Communication: No apparent difficulties  Cognition Arousal: Alert Behavior During Therapy: [facility]  for tasks assessed/performed   PT - Cognitive impairments: History of cognitive impairments, No apparent impairments                       PT - Cognition Comments: mild early Alzheimer's dementia per prior notes. Following commands: Intact       Cueing Cueing Techniques: Verbal cues  Exercises General Exercises - Lower Extremity Ankle Circles/Pumps: AROM, Seated, Left, 15 reps Quad Sets: Strengthening, Both, 10 reps, Seated Gluteal Sets: Strengthening, Both, 15 reps, Seated Long Arc Quad: Strengthening, Both, Seated, 10 reps Hip ABduction/ADduction: Strengthening, Both, Seated, 10 reps Other Exercises Other Exercises: IS x 5 reps cues for technique and frequency Other Exercises: Educated on repositioning techniques, pressure redistribution for sacrum due to discomfort reported.    General Comments General comments (skin integrity, edema, etc.): On 3.5L at rest in bed, BP 104/53. Sitting EOB 94% with 6L BP 97/57 HR 93. Gait training with sats dropping to 79% but improved to 88% within 2.5 minutes upon sitting with breathing techniques. HR returned to 107, from 130 at peak while ambulating.      Pertinent Vitals/Pain Pain Assessment Pain Assessment: No/denies pain    Home Living                          Prior Function            PT Goals (current goals can now be found in the care plan section) Acute Rehab PT Goals Patient Stated Goal: Unsure PT Goal Formulation: With patient/family Time For Goal Achievement: 10/04/23 Potential to Achieve Goals: Fair Progress towards PT goals: Progressing toward goals    Frequency    Min 2X/week      PT Plan      Co-evaluation              AM-PAC PT "6 Clicks" Mobility   Outcome Measure  Help needed turning from your back to your side while in a flat bed without using bedrails?: None Help needed moving from lying on your back to sitting on the side of a flat bed without using bedrails?: A Little Help needed moving to and from a bed to a chair (including a wheelchair)?: A Little Help needed standing up from a chair using your arms (e.g., wheelchair or bedside chair)?: A Little Help needed to walk in hospital room?: A Little Help needed climbing 3-5 steps  with a railing? : Total 6 Click Score: 17    End of Session Equipment Utilized During Treatment: Oxygen  Activity Tolerance: Treatment limited secondary to medical complications (Comment) (desat with minimal activity) Patient left: in chair;with call bell/phone within reach;with chair alarm set   PT Visit Diagnosis: Other abnormalities of gait and mobility (R26.89);Muscle weakness (generalized) (M62.81);Difficulty in walking, not elsewhere classified (R26.2)     Time: 1207-1238 PT Time Calculation (min) (ACUTE ONLY): 31 min  Charges:    $Therapeutic Exercise: 8-22 mins $Therapeutic Activity: 8-22 mins PT General Charges $$ ACUTE PT VISIT: 1 Visit                     Jory Ng, PT, DPT [facility]  Rehabilitation Services Physical Therapist Office: [phone] Website: [email or web address]    Alinda Irani 09/28/2023, 1:48 PM

## 2023-09-28 NOTE — Progress Notes (Signed)
 Occupational Therapy Treatment Patient Details Name: Shelly [name] MRN: [id number] DOB: May 03, 1951 Today's Date: 09/28/2023   History of present illness 73 y.o. female admitted 09/11/23 with hypoxemia and PE. PMhx: Admission 3/26-4/2 with N/V, falls, CAP. Rt BKA, HTN, HLD, mild early Alzheimer's dementia, asthma, fibromyalgia, anxiety.   OT comments  Pt c/o loosening of RLE outer prosthetic sleeve as she states she has lost 30lbs recently and it is loose. It does appear loose but Pt is able to use prosthetic for short distances, states she has to be very careful or it will slip off. Pt able to complete ADLs with set-up/CGA for safety, significantly limited by SOB and decreased activity tolerance. Pt has worked with PT earlier, and mobility twice, so is feeling out of it. Recommending updated to postacute rehab <3hrs/day, will continue to follow Pt acutely to progress with functional strength and activity tolerance.       If plan is discharge home, recommend the following:  A little help with bathing/dressing/bathroom;Assistance with cooking/housework;Assist for transportation;Help with stairs or ramp for entrance   Equipment Recommendations  None recommended by OT    Recommendations for Other Services      Precautions / Restrictions Precautions Precautions: Fall Recall of Precautions/Restrictions: Intact Precaution/Restrictions Comments: watch SpO2, check orthostatics Restrictions Weight Bearing Restrictions Per Provider Order: No       Mobility Bed Mobility Overal bed mobility: Needs Assistance             General bed mobility comments: in recliner    Transfers Overall transfer level: Needs assistance Equipment used: Rolling walker (2 wheels) Transfers: Sit to/from Stand Sit to Stand: Contact guard assist           General transfer comment: CGA for safety     Balance Overall balance assessment: Needs assistance Sitting-balance support: No upper extremity  supported, Feet supported Sitting balance-Leahy Scale: Good     Standing balance support: Bilateral upper extremity supported, Reliant on assistive device for balance Standing balance-Leahy Scale: Poor Standing balance comment: reliant on RW for support                           ADL either performed or assessed with clinical judgement   ADL Overall ADL's : Needs assistance/impaired Eating/Feeding: Independent;Sitting   Grooming: Set up;Sitting;Wash/dry hands           Upper Body Dressing : Set up;Sitting   Lower Body Dressing: Set up;Sitting/lateral leans;Sit to/from stand                 General ADL Comments: assisted Pt in recliner with LB ADLs, don/doff prosthetic supervision/set up level. Pt c/o of loosening of outer sleeve that holds prosthetic on as she has lost 30lbs. Pt is able to safely use to ambulate short distances without problem.    Extremity/Trunk Assessment              Vision       Restaurant manager, fast food Communication: No apparent difficulties   Cognition Arousal: Alert Behavior During Therapy: WFL for tasks assessed/performed Cognition: No apparent impairments                               Following commands: Intact        Cueing   Cueing Techniques: Verbal cues  Exercises      Shoulder Instructions  General Comments On 3.5L at rest in bed, BP 104/53. Sitting EOB 94% with 6L BP 97/57 HR 93. Gait training with sats dropping to 79% but improved to 88% within 2.5 minutes upon sitting with breathing techniques. HR returned to 107, from 130 at peak while ambulating.    Pertinent Vitals/ Pain       Pain Assessment Pain Assessment: No/denies pain  Home Living                                          Prior Functioning/Environment              Frequency  Min 2X/week        Progress Toward Goals  OT Goals(current goals can now be found in  the care plan section)  Progress towards OT goals: Progressing toward goals  Acute Rehab OT Goals Patient Stated Goal: to improve activity tolerance OT Goal Formulation: With patient Time For Goal Achievement: 10/12/23 Potential to Achieve Goals: Good ADL Goals Pt Will Perform Upper Body Dressing: with modified independence Pt Will Perform Lower Body Dressing: with modified independence Pt Will Transfer to Toilet: with modified independence Pt Will Perform Toileting - Clothing Manipulation and hygiene: with modified independence Additional ADL Goal #1: Pt will be able to tolerate 5 mins of continuous OOB activity while maintaining safety awareness and stable O2 levels to maximize activity tolerance and return to PLOF.  Plan      Co-evaluation                 AM-PAC OT "6 Clicks" Daily Activity     Outcome Measure   Help from another person eating meals?: None Help from another person taking care of personal grooming?: A Little Help from another person toileting, which includes using toliet, bedpan, or urinal?: A Little Help from another person bathing (including washing, rinsing, drying)?: A Little Help from another person to put on and taking off regular upper body clothing?: A Little Help from another person to put on and taking off regular lower body clothing?: A Little 6 Click Score: 19    End of Session Equipment Utilized During Treatment: Rolling walker (2 wheels)  OT Visit Diagnosis: Unsteadiness on feet (R26.81);Other abnormalities of gait and mobility (R26.89);Muscle weakness (generalized) (M62.81)   Activity Tolerance Patient limited by fatigue   Patient Left in chair;with call bell/phone within reach   Nurse Communication Mobility status        Time: 1336-1350 OT Time Calculation (min): 14 min  Charges: OT General Charges $OT Visit: 1 Visit OT Treatments $Self Care/Home Management : 8-22 mins  [place], OTR/L   Scherry Curtis 09/28/2023,  3:58 PM

## 2023-09-28 NOTE — Progress Notes (Signed)
   09/28/23 1458  Mobility  Activity  (BUE Exercises)  Level of Assistance Standby assist, set-up cues, supervision of patient - no hands on  Assistive Device None  Range of Motion/Exercises Right arm;Left arm;Active  Activity Response Tolerated well  Mobility Referral Yes  Mobility visit 1 Mobility  Mobility Specialist Start Time (ACUTE ONLY) 1458  Mobility Specialist Stop Time (ACUTE ONLY) 1510  Mobility Specialist Time Calculation (min) (ACUTE ONLY) 12 min   Mobility Specialist: Progress Note  Pre-Mobility:      SpO2 94% 3L During Mobility: SpO2 89-91% 3L Post-Mobility:    SpO2 93% 3L  Pt agreeable to mobility session - received in chair. Pt with no complaints, coughing towards EOS, VSS. Pt states "this feels good" when performing BUE exercises. Pt deferred further mobility as she had a coughing fit this morning and worked with PT recently. Returned to bed with all needs met - call bell within reach.   BUE Exercises: Finger Pulses (x20), Cross Arm Reaches (x10), Bicep Curls (x10), Overhead Triceps Extensions (using pillow, x10), Chest Press (using pillow, x10), shoulder shrugs (x10).     Shelly [name], BS Mobility Specialist Please contact via SecureChat or  Rehab office at [phone].

## 2023-09-28 NOTE — Progress Notes (Signed)
 PROGRESS NOTE        PATIENT DETAILS Name: Shelly [name] Age: 73 y.o. Sex: female Date of Birth: 1951/03/12 Admit Date: 09/11/2023 Admitting Physician Josiah Nigh, MD [id number], Corbett Desanctis, MD  Brief Summary: Patient is a 73 y.o.  female with history of dementia, right BKA, HTN, HLD, COPD-who was hospitalized from 3/26-4/2 for acute hypoxic respiratory failure secondary to rhinovirus/strep pneumonia infection-patient was stabilized-discharged on home oxygen -presented to the ED on 4/7 with worsening shortness of breath-found to be severely hypoxemic-CTA chest showed a small PE-she was started on heated high flow and subsequently admitted by the hospitalist service but due to worsening hypoxemia-she was transferred to the ICU for close monitoring.  She was stabilized and transferred back to [facility] on 4/10.  Significant events: 3/26-4/2>> hospitalization for hypoxia-rhinovirus/strep pneumo antigen positive-discharged on home O2 4/7>> worsening shortness of breath-worsening hypoxemia-admit to [facility] but later transferred to ICU 4/10>> transferred to [facility] 4/11>> O2 saturation in the 80s on 15 L of HFNC-CXR unchanged-switched to heated high flow-IV Lasix  started-prednisone  switched to Solu-Medrol . 4/14>> switch to salter high flow 4/15>> down to 5 L salter high flow 4/17>> Down to 2-3 L of oxygen -but still gets short of breath with minimal ambulation requiring around 8-10 L of oxygen .  Significant studies: 4/7>> CTA chest: Few scattered segmental PE medial left upper lobe/middle lobe-changing parenchymal lung opacities with areas of interstitial septal thickening and groundglass opacities. 4/8>> echo: EF 65-70% 4/8>> B/L lower extremity Doppler: Age indeterminate DVT left peroneal vein  Significant microbiology data: 4/7>> COVID/influenza/RSV PCR: Negative 4/7>> sputum culture: Pending 4/7>> blood culture: No growth  Procedures: None  Consults: PCCM  Subjective:   Patient in bed, appears comfortable, denies any headache, no fever, no chest pain or pressure, no shortness of breath , no abdominal pain. No new focal weakness.  Objective: Vitals: Blood pressure (!) 103/57, pulse 71, temperature 97.7 F (36.5 C), temperature source Oral, resp. rate (!) 21, height 5\' 5"  (1.651 m), weight 67 kg, SpO2 95%.   Exam:  Awake Alert, No new F.N deficits, Normal affect [place].AT,PERRAL Supple Neck, No JVD,   Symmetrical Chest wall movement, Good air movement bilaterally, few rales RRR,No Gallops, Rubs or new Murmurs,  +ve B.Sounds, Abd Soft, No tenderness,   No Cyanosis, Clubbing or edema    Assessment/Plan:  Acute hypoxic respiratory failure secondary to recent rhinovirus/strep pneumoniae-possible interstitial lung disease related to recent pneumonia-and now small PE Gradually improving-Down to 3-4 L of oxygen  at rest but still requires anywhere from 10-15 L of HFNC to maintain sats with ambulation. Completed IV Zosyn  x 7 days on 4/15 On prednisone  taper, gradually taper over 2-3 weeks.    She has shown significant improvement in the last 2 days upon sitting up in chair and using I-S and flutter valve for pulmonary toiletry.  Patient at rest now requiring 2-3 L nasal cannula oxygen , upon ambulation pulse ox drops to 86% and she requires 5 L of nasal cannula oxygen  which gets better after 10 to 15 minutes of rest, she is saturating between 90 to 95% on 3 L nasal cannula oxygen  at rest.  SpO2: 95 % O2 Flow Rate (L/min): 2.5 L/min FiO2 (%): 50 % (Titrated to 50%, O2 saturations 94%94)   Segmental left-sided pulmonary embolism with left peroneal vein DVT  Continue Eliquis   Normocytic anemia Likely due to critical illness No  evidence of blood loss Folate/vitamin B12 level stable. Follow CBC  Chronic HFpEF Euvolemic Lasix  being used as needed will give a dose on 09/27/2023.  COPD Not in exacerbation Continue bronchodilators  HLD Statin  HTN BP  soft, hold ARB.  Mood disorder Stable Continue Wellbutrin /Prozac   Dementia Delirium precautions Aricept   History of right BKA- uses prosthesis  Code status:   Code Status: Full Code   DVT Prophylaxis: apixaban  (ELIQUIS ) tablet 5 mg    Family Communication: Daughter-Marissa-[phone]-updated 4/18  Disposition Plan: Status is: Inpatient Remains inpatient appropriate because: Severity of illness   Planned Discharge Destination:Home   Diet: Diet Order             DIET DYS 3 Room service appropriate? Yes with Assist; Fluid consistency: Thin  Diet effective now                    MEDICATIONS: Scheduled Meds:  apixaban   5 mg Oral BID   arformoterol   15 mcg Nebulization BID   budesonide  (PULMICORT ) nebulizer solution  0.5 mg Nebulization BID   buPROPion   300 mg Oral Daily   Chlorhexidine  Gluconate Cloth  6 each Topical Q0600   donepezil   10 mg Oral QHS   FLUoxetine   20 mg Oral Daily   furosemide   20 mg Oral Once   guaiFENesin   600 mg Oral BID   melatonin  3 mg Oral QHS   midodrine   10 mg Oral TID WC   montelukast   10 mg Oral QHS   pantoprazole   40 mg Oral Daily   potassium chloride   40 mEq Oral Once   predniSONE   40 mg Oral Q breakfast   revefenacin   175 mcg Nebulization Daily   rosuvastatin   10 mg Oral QHS   sodium chloride  flush  3 mL Intravenous Q12H   Continuous Infusions:  PRN Meds:.acetaminophen  **OR** acetaminophen , ipratropium-albuterol , magic mouthwash w/lidocaine , ondansetron  **OR** ondansetron  (ZOFRAN ) IV, polyvinyl alcohol    I have personally reviewed following labs and imaging studies  LABORATORY DATA: CBC: Recent Labs  Lab 09/23/23 0438 09/25/23 0436 09/28/23 0450  WBC 12.9* 13.2* 16.1*  NEUTROABS  --  9.3* 12.6*  HGB 9.6* 10.1* 11.0*  HCT 29.7* 31.4* 33.4*  MCV 96.4 96.3 95.4  PLT 310 314 322    Basic Metabolic Panel: Recent Labs  Lab 09/22/23 0432 09/23/23 0438 09/25/23 0436 09/28/23 0450  NA 137 136 137 135  K 4.9  4.0 4.2 3.9  CL 100 96* 99 96*  CO2 28 29 32 28  GLUCOSE 83 84 81 92  BUN 20 16 14 20   CREATININE 0.59 0.57 0.62 0.69  CALCIUM  8.6* 8.5* 8.9 8.9  MG  --   --  2.0 1.9    Urine analysis:    Component Value Date/Time   COLORURINE YELLOW 09/11/2023 0812   APPEARANCEUR HAZY (A) 09/11/2023 0812   LABSPEC 1.018 09/11/2023 0812   PHURINE 5.0 09/11/2023 0812   GLUCOSEU NEGATIVE 09/11/2023 0812   GLUCOSEU NEGATIVE 09/08/2010 1617   HGBUR NEGATIVE 09/11/2023 0812   BILIRUBINUR NEGATIVE 09/11/2023 0812   KETONESUR 20 (A) 09/11/2023 0812   PROTEINUR NEGATIVE 09/11/2023 0812   UROBILINOGEN 0.2 09/08/2010 1617   NITRITE NEGATIVE 09/11/2023 0812   LEUKOCYTESUR NEGATIVE 09/11/2023 0812    Sepsis Labs: Lactic Acid, Venous    Component Value Date/Time   LATICACIDVEN 1.0 09/12/2023 0511    MICROBIOLOGY: No results found for this or any previous visit (from the past 240 hours).   RADIOLOGY STUDIES/RESULTS: DG Chest  Port 1 View Result Date: 09/27/2023 CLINICAL DATA:  Shortness of breath EXAM: PORTABLE CHEST 1 VIEW COMPARISON:  09/24/2023 FINDINGS: Cardiac shadow is stable. Spinal stimulator is again noted. Bibasilar airspace opacities are again seen and stable. No new focal infiltrate is noted. No effusion is seen. Bony structures appear within normal limits. IMPRESSION: Stable basilar airspace opacities Electronically Signed   By: Violeta Grey M.D.   On: 09/27/2023 11:21     LOS: 17 days   Signature  -    Lynnwood Sauer M.D on 09/28/2023 at 9:09 AM   -  To page go to [email or web address]

## 2023-09-28 NOTE — Plan of Care (Signed)
 Pt has rested quietly throughout the night with no distress noted. Alert and oriented. On O22LNC. SR on the monitor. Purewick intact to suction. Medicated with tylenol  once with relief noted. No other complaints voiced.    Problem: Education: Goal: Knowledge of General Education information will improve Description: Including pain rating scale, medication(s)/side effects and non-pharmacologic comfort measures Outcome: Progressing   Problem: Health Behavior/Discharge Planning: Goal: Ability to manage health-related needs will improve Outcome: Progressing   Problem: Clinical Measurements: Goal: Respiratory complications will improve Outcome: Progressing Goal: Cardiovascular complication will be avoided Outcome: Progressing   Problem: Pain Managment: Goal: General experience of comfort will improve and/or be controlled Outcome: Progressing

## 2023-09-28 NOTE — Progress Notes (Addendum)
   09/28/23 0933  Mobility  Activity Transferred to/from [facility]  Level of Assistance Contact guard assist, steadying assist  Assistive Device Front wheel walker  Activity Response Tolerated fair  Mobility Referral Yes  Mobility visit 1 Mobility  Mobility Specialist Start Time (ACUTE ONLY) 0933  Mobility Specialist Stop Time (ACUTE ONLY) 0956  Mobility Specialist Time Calculation (min) (ACUTE ONLY) 23 min   Mobility Specialist: Progress Note  Pre-Mobility:      HR 86, SpO2 94% 2.5L During Mobility: HR 125, SpO2 73-77% 6L Post-Mobility:    HR 100, SpO2 90% 11L  Pt agreeable to mobility session - received in bed. Attempted backwards Scoot with pt to [facility]. This triggered a coughing fit lasting about 10 minutes causing desat.  C/o SOB and consistent coughing with exertion. Pt was able to stand and use RW to get back to bed. Returned to bed with all needs met - call bell within reach. RN present. RN titrated patient to 11LO2. Shortly after session RN stated pt was back on 4LO2.   Isla Mari, BS Mobility Specialist Please contact via SecureChat or  Rehab office at [phone].

## 2023-09-29 DIAGNOSIS — J9601 Acute respiratory failure with hypoxia: Secondary | ICD-10-CM | POA: Diagnosis not present

## 2023-09-29 LAB — GLUCOSE, CAPILLARY
Glucose-Capillary: 89 mg/dL (ref 70–99)
Glucose-Capillary: 179 mg/dL — ABNORMAL HIGH (ref 70–99)
Glucose-Capillary: 208 mg/dL — ABNORMAL HIGH (ref 70–99)

## 2023-09-29 NOTE — Progress Notes (Deleted)
 Mobility Specialist Progress Note:  Pre Mobility 3L/min SPO2 97% During Mobility 6L/min to keep SPO2 above 88% Post Mobility 3L/min SPO2 94%  Shelly [name] Mobility Specialist  Please contact vis Secure Chat or  Rehab Office [phone]

## 2023-09-29 NOTE — Plan of Care (Signed)

## 2023-09-29 NOTE — Progress Notes (Signed)
 PROGRESS NOTE        PATIENT DETAILS Name: Shelly [name] Age: 73 y.o. Sex: female Date of Birth: 1951/01/12 Admit Date: 09/11/2023 Admitting Physician Josiah Nigh, MD [id number], Corbett Desanctis, MD  Brief Summary: Patient is a 73 y.o.  female with history of dementia, right BKA, HTN, HLD, COPD-who was hospitalized from 3/26-4/2 for acute hypoxic respiratory failure secondary to rhinovirus/strep pneumonia infection-patient was stabilized-discharged on home oxygen -presented to the ED on 4/7 with worsening shortness of breath-found to be severely hypoxemic-CTA chest showed a small PE-she was started on heated high flow and subsequently admitted by the hospitalist service but due to worsening hypoxemia-she was transferred to the ICU for close monitoring.  She was stabilized and transferred back to [facility] on 4/10.  Significant events: 3/26-4/2>> hospitalization for hypoxia-rhinovirus/strep pneumo antigen positive-discharged on home O2 4/7>> worsening shortness of breath-worsening hypoxemia-admit to [facility] but later transferred to ICU 4/10>> transferred to [facility] 4/11>> O2 saturation in the 80s on 15 L of HFNC-CXR unchanged-switched to heated high flow-IV Lasix  started-prednisone  switched to Solu-Medrol . 4/14>> switch to salter high flow 4/15>> down to 5 L salter high flow 4/17>> Down to 2-3 L of oxygen -but still gets short of breath with minimal ambulation requiring around 8-10 L of oxygen .  Significant studies: 4/7>> CTA chest: Few scattered segmental PE medial left upper lobe/middle lobe-changing parenchymal lung opacities with areas of interstitial septal thickening and groundglass opacities. 4/8>> echo: EF 65-70% 4/8>> B/L lower extremity Doppler: Age indeterminate DVT left peroneal vein  Significant microbiology data: 4/7>> COVID/influenza/RSV PCR: Negative 4/7>> sputum culture: Pending 4/7>> blood culture: No growth  Procedures: None  Consults: PCCM  Subjective:   Patient in bed, appears comfortable, denies any headache, no fever, no chest pain or pressure, no shortness of breath , no abdominal pain. No new focal weakness.   Objective: Vitals: Blood pressure 114/63, pulse 77, temperature 97.6 F (36.4 C), temperature source Oral, resp. rate 19, height 5\' 5"  (1.651 m), weight 68.1 kg, SpO2 99%.   Exam:  Awake Alert, No new F.N deficits, Normal affect [place].AT,PERRAL Supple Neck, No JVD,   Symmetrical Chest wall movement, Good air movement bilaterally, few rales RRR,No Gallops, Rubs or new Murmurs,  +ve B.Sounds, Abd Soft, No tenderness,   No Cyanosis, Clubbing or edema    Assessment/Plan:  Acute hypoxic respiratory failure secondary to recent rhinovirus/strep pneumoniae-possible interstitial lung disease related to recent pneumonia-and now small PE Gradually improving-Down to 3-4 L of oxygen  at rest but still requires anywhere from 10-15 L of HFNC to maintain sats with ambulation. Completed IV Zosyn  x 7 days on 4/15 On prednisone  taper, gradually taper over 2-3 weeks.    She has shown significant improvement in the last 2 days upon sitting up in chair and using I-S and flutter valve for pulmonary toiletry.  Patient at rest now requiring 2-3 L nasal cannula oxygen , upon ambulation pulse ox drops to 86% and she requires 5 L of nasal cannula oxygen  which gets better after 10 to 15 minutes of rest, she is saturating between 90 to 95% on 3 L nasal cannula oxygen  at rest.  She requires an SNF bed, has not picked up a facility yet according to the patient.  SpO2: 99 % O2 Flow Rate (L/min): 3 L/min FiO2 (%): 50 % (Titrated to 50%, O2 saturations 94%94)   Segmental left-sided pulmonary embolism with  left peroneal vein DVT  Continue Eliquis   Normocytic anemia Likely due to critical illness No evidence of blood loss Folate/vitamin B12 level stable. Follow CBC  Chronic HFpEF Euvolemic Lasix  being used as needed will give a dose on  09/27/2023.  COPD Not in exacerbation Continue bronchodilators  HLD Statin  HTN BP soft, hold ARB.  Mood disorder Stable Continue Wellbutrin /Prozac   Dementia Delirium precautions Aricept   History of right BKA- uses prosthesis  Code status:   Code Status: Full Code   DVT Prophylaxis: apixaban  (ELIQUIS ) tablet 5 mg    Family Communication: Daughter-[phone]-updated 4/18  Disposition Plan: Status is: Inpatient Remains inpatient appropriate because: Severity of illness   Planned Discharge Destination:Home   Diet: Diet Order             DIET DYS 3 Room service appropriate? Yes with Assist; Fluid consistency: Thin  Diet effective now                    MEDICATIONS: Scheduled Meds:  apixaban   5 mg Oral BID   arformoterol   15 mcg Nebulization BID   budesonide  (PULMICORT ) nebulizer solution  0.5 mg Nebulization BID   buPROPion   300 mg Oral Daily   Chlorhexidine  Gluconate Cloth  6 each Topical Q0600   donepezil   10 mg Oral QHS   FLUoxetine   20 mg Oral Daily   guaiFENesin   600 mg Oral BID   melatonin  3 mg Oral QHS   midodrine   10 mg Oral TID WC   montelukast   10 mg Oral QHS   pantoprazole   40 mg Oral Daily   predniSONE   40 mg Oral Q breakfast   revefenacin   175 mcg Nebulization Daily   rosuvastatin   10 mg Oral QHS   sodium chloride  flush  3 mL Intravenous Q12H   Continuous Infusions:  PRN Meds:.acetaminophen  **OR** acetaminophen , ipratropium-albuterol , magic mouthwash w/lidocaine , ondansetron  **OR** ondansetron  (ZOFRAN ) IV, polyvinyl alcohol    I have personally reviewed following labs and imaging studies  LABORATORY DATA: CBC: Recent Labs  Lab 09/23/23 0438 09/25/23 0436 09/28/23 0450  WBC 12.9* 13.2* 16.1*  NEUTROABS  --  9.3* 12.6*  HGB 9.6* 10.1* 11.0*  HCT 29.7* 31.4* 33.4*  MCV 96.4 96.3 95.4  PLT 310 314 322    Basic Metabolic Panel: Recent Labs  Lab 09/23/23 0438 09/25/23 0436 09/28/23 0450  NA 136 137 135  K  4.0 4.2 3.9  CL 96* 99 96*  CO2 29 32 28  GLUCOSE 84 81 92  BUN 16 14 20   CREATININE 0.57 0.62 0.69  CALCIUM  8.5* 8.9 8.9  MG  --  2.0 1.9    Urine analysis:    Component Value Date/Time   COLORURINE YELLOW 09/11/2023 0812   APPEARANCEUR HAZY (A) 09/11/2023 0812   LABSPEC 1.018 09/11/2023 0812   PHURINE 5.0 09/11/2023 0812   GLUCOSEU NEGATIVE 09/11/2023 0812   GLUCOSEU NEGATIVE 09/08/2010 1617   HGBUR NEGATIVE 09/11/2023 0812   BILIRUBINUR NEGATIVE 09/11/2023 0812   KETONESUR 20 (A) 09/11/2023 0812   PROTEINUR NEGATIVE 09/11/2023 0812   UROBILINOGEN 0.2 09/08/2010 1617   NITRITE NEGATIVE 09/11/2023 0812   LEUKOCYTESUR NEGATIVE 09/11/2023 0812    Sepsis Labs: Lactic Acid, Venous    Component Value Date/Time   LATICACIDVEN 1.0 09/12/2023 0511    MICROBIOLOGY: No results found for this or any previous visit (from the past 240 hours).   RADIOLOGY STUDIES/RESULTS: DG Chest Port 1 View Result Date: 09/27/2023 CLINICAL DATA:  Shortness of breath EXAM: PORTABLE  CHEST 1 VIEW COMPARISON:  09/24/2023 FINDINGS: Cardiac shadow is stable. Spinal stimulator is again noted. Bibasilar airspace opacities are again seen and stable. No new focal infiltrate is noted. No effusion is seen. Bony structures appear within normal limits. IMPRESSION: Stable basilar airspace opacities Electronically Signed   By: Violeta Grey M.D.   On: 09/27/2023 11:21     LOS: 18 days   Signature  -    Lynnwood Sauer M.D on 09/29/2023 at 8:31 AM   -  To page go to [email or web address]

## 2023-09-29 NOTE — TOC Progression Note (Addendum)
 Transition of Care [facility]) - Progression Note    Patient Details  Name: Ameliarose Shark MRN: [id number] Date of Birth: Feb 07, 1951  Transition of Care [facility]) CM/SW Contact  Jossalin Chervenak A [place], LCSW Phone Number: 09/29/2023, 11:34 AM  Clinical Narrative:      Update 1357 CSW was contacted by Alston Jerry at [facility]. Pt accepted for short term rehab. EDD tomorrow.  CSW notified provided. Contacted daughter Connell Degree and provided update.    Update 1307 CSW was reached out to by pt's daughter, Connell Degree, [phone]. CSW informed her that [facility] was going to get back to CSW about confirming bed placement for rehab and would reach back out once finalized. Explained DC process as well. CSW to follow up with Connell Degree and pt once CSW hears back from [employer]. Requested to be POC for possible DC tomorrow. Weekend CSW will be made aware.   1225 CSW spoke with Alston Jerry at [facility]. She informed CSW that she is waiting for nursing to provide clinical acceptance to rehab. Confirmed pt's insurance as well. Pt possible DC tomorrow, informed facility. She stated that she would let me know if accepted and can take admission tomorrow.   1134 CSW met with pt at bedside to provide follow up. Answered questions regarding discharge process and informed her would follow up once confirmed bed acceptance with [facility]. CSW provided contact information and pt informed CSW her daughters will be reaching out if needed as well.    TOC will continue to follow.   Expected Discharge Plan: Skilled Nursing Facility Barriers to Discharge: Continued Medical Work up, SNF Pending bed offer  Expected Discharge Plan and Services In-house Referral: Clinical Social Work   Post Acute Care Choice: Skilled Nursing Facility Living arrangements for the past 2 months: Single Family Home                           HH Arranged: PT, OT, RN, Disease Management, Nurse's Aide   Date HH Agency  Contacted: 09/13/23 Time HH Agency Contacted: 1000 Representative spoke with at [facility] Agency: Randel Buss   Social Determinants of Health (SDOH) Interventions SDOH Screenings   Food Insecurity: No Food Insecurity (09/11/2023)  Housing: Low Risk  (09/11/2023)  Transportation Needs: No Transportation Needs (09/11/2023)  Utilities: Not At Risk (09/11/2023)  Social Connections: Moderately Isolated (09/11/2023)  Tobacco Use: Medium Risk (09/11/2023)    Readmission Risk Interventions    09/12/2023    1:24 PM 09/05/2023    4:03 PM  Readmission Risk Prevention Plan  Transportation Screening Complete Complete  PCP or Specialist Appt within 5-7 Days  Complete  PCP or Specialist Appt within 3-5 Days Complete   Home Care Screening  Complete  Medication Review (RN CM)  Complete  HRI or Home Care Consult Complete   Social Work Consult for Recovery Care Planning/Counseling Complete   Palliative Care Screening Not Applicable   Medication Review Oceanographer) Referral to Pharmacy

## 2023-09-29 NOTE — Progress Notes (Signed)
 Mobility Specialist Progress Note:   09/29/23 1000  Mobility  Activity Transferred from bed to chair  Level of Assistance Contact guard assist, steadying assist  Assistive Device Front wheel walker  Distance Ambulated (ft) 5 ft  Activity Response Tolerated well  Mobility Referral Yes  Mobility visit 1 Mobility  Mobility Specialist Start Time (ACUTE ONLY) [id number]  Mobility Specialist Stop Time (ACUTE ONLY) 0946  Mobility Specialist Time Calculation (min) (ACUTE ONLY) 20 min   Pt received in bed agreeable to mobility. No physical assistance required. Attempted to ambulate on 3L/min but d/t desat to 83% O2 flow increased to 6L/min. Was able to don R prosthesis independently. Took a couple steps towards the chair w/o fault. Left in chair w/ call bell and personal belongings in reach. All needs met.  Pre Mobility 3L SPO2 97% During Mobility 6L/min SPO2 88%-94% Post Mobility 3L/min SPO2 94%  Inetta Manes Mobility Specialist  Please contact vis Secure Chat or  Rehab Office [phone]

## 2023-09-29 NOTE — Plan of Care (Signed)
 Pt has rested quietly throughout the night with no distress noted. Alert and oriented. ON O22LNC. SR on the monitor. Purewick intact to suction. Medicated with tylenol  for pain with relief noted. No other complaints voiced.      Problem: Education: Goal: Knowledge of General Education information will improve Description: Including pain rating scale, medication(s)/side effects and non-pharmacologic comfort measures Outcome: Progressing   Problem: Health Behavior/Discharge Planning: Goal: Ability to manage health-related needs will improve Outcome: Progressing   Problem: Clinical Measurements: Goal: Respiratory complications will improve Outcome: Progressing Goal: Cardiovascular complication will be avoided Outcome: Progressing   Problem: Pain Managment: Goal: General experience of comfort will improve and/or be controlled Outcome: Progressing

## 2023-09-30 DIAGNOSIS — J9601 Acute respiratory failure with hypoxia: Secondary | ICD-10-CM | POA: Diagnosis not present

## 2023-09-30 DIAGNOSIS — M51362 Other intervertebral disc degeneration, lumbar region with discogenic back pain and lower extremity pain: Secondary | ICD-10-CM | POA: Diagnosis not present

## 2023-09-30 DIAGNOSIS — K5901 Slow transit constipation: Secondary | ICD-10-CM | POA: Diagnosis not present

## 2023-09-30 DIAGNOSIS — M5416 Radiculopathy, lumbar region: Secondary | ICD-10-CM | POA: Diagnosis not present

## 2023-09-30 DIAGNOSIS — E782 Mixed hyperlipidemia: Secondary | ICD-10-CM | POA: Diagnosis not present

## 2023-09-30 DIAGNOSIS — A4189 Other specified sepsis: Secondary | ICD-10-CM | POA: Diagnosis not present

## 2023-09-30 DIAGNOSIS — J9611 Chronic respiratory failure with hypoxia: Secondary | ICD-10-CM | POA: Diagnosis not present

## 2023-09-30 DIAGNOSIS — R2689 Other abnormalities of gait and mobility: Secondary | ICD-10-CM | POA: Diagnosis not present

## 2023-09-30 DIAGNOSIS — R531 Weakness: Secondary | ICD-10-CM | POA: Diagnosis not present

## 2023-09-30 DIAGNOSIS — J45998 Other asthma: Secondary | ICD-10-CM | POA: Diagnosis not present

## 2023-09-30 DIAGNOSIS — I959 Hypotension, unspecified: Secondary | ICD-10-CM | POA: Diagnosis not present

## 2023-09-30 DIAGNOSIS — G309 Alzheimer's disease, unspecified: Secondary | ICD-10-CM | POA: Diagnosis not present

## 2023-09-30 DIAGNOSIS — M810 Age-related osteoporosis without current pathological fracture: Secondary | ICD-10-CM | POA: Diagnosis not present

## 2023-09-30 DIAGNOSIS — Q791 Other congenital malformations of diaphragm: Secondary | ICD-10-CM | POA: Diagnosis not present

## 2023-09-30 DIAGNOSIS — M25811 Other specified joint disorders, right shoulder: Secondary | ICD-10-CM | POA: Diagnosis not present

## 2023-09-30 DIAGNOSIS — L309 Dermatitis, unspecified: Secondary | ICD-10-CM | POA: Diagnosis not present

## 2023-09-30 DIAGNOSIS — I5032 Chronic diastolic (congestive) heart failure: Secondary | ICD-10-CM | POA: Diagnosis not present

## 2023-09-30 DIAGNOSIS — G4709 Other insomnia: Secondary | ICD-10-CM | POA: Diagnosis not present

## 2023-09-30 DIAGNOSIS — J45991 Cough variant asthma: Secondary | ICD-10-CM | POA: Diagnosis not present

## 2023-09-30 DIAGNOSIS — G3184 Mild cognitive impairment, so stated: Secondary | ICD-10-CM | POA: Diagnosis not present

## 2023-09-30 DIAGNOSIS — Z8739 Personal history of other diseases of the musculoskeletal system and connective tissue: Secondary | ICD-10-CM | POA: Diagnosis not present

## 2023-09-30 DIAGNOSIS — Z89511 Acquired absence of right leg below knee: Secondary | ICD-10-CM | POA: Diagnosis not present

## 2023-09-30 DIAGNOSIS — J9811 Atelectasis: Secondary | ICD-10-CM | POA: Diagnosis not present

## 2023-09-30 DIAGNOSIS — J189 Pneumonia, unspecified organism: Secondary | ICD-10-CM | POA: Diagnosis not present

## 2023-09-30 DIAGNOSIS — J9600 Acute respiratory failure, unspecified whether with hypoxia or hypercapnia: Secondary | ICD-10-CM | POA: Diagnosis not present

## 2023-09-30 DIAGNOSIS — J4540 Moderate persistent asthma, uncomplicated: Secondary | ICD-10-CM | POA: Diagnosis not present

## 2023-09-30 DIAGNOSIS — E785 Hyperlipidemia, unspecified: Secondary | ICD-10-CM | POA: Diagnosis not present

## 2023-09-30 DIAGNOSIS — F32A Depression, unspecified: Secondary | ICD-10-CM | POA: Diagnosis not present

## 2023-09-30 DIAGNOSIS — D649 Anemia, unspecified: Secondary | ICD-10-CM | POA: Diagnosis not present

## 2023-09-30 DIAGNOSIS — I2510 Atherosclerotic heart disease of native coronary artery without angina pectoris: Secondary | ICD-10-CM | POA: Diagnosis not present

## 2023-09-30 DIAGNOSIS — M6281 Muscle weakness (generalized): Secondary | ICD-10-CM | POA: Diagnosis not present

## 2023-09-30 DIAGNOSIS — Z7401 Bed confinement status: Secondary | ICD-10-CM | POA: Diagnosis not present

## 2023-09-30 DIAGNOSIS — K5900 Constipation, unspecified: Secondary | ICD-10-CM | POA: Diagnosis not present

## 2023-09-30 DIAGNOSIS — R262 Difficulty in walking, not elsewhere classified: Secondary | ICD-10-CM | POA: Diagnosis not present

## 2023-09-30 DIAGNOSIS — R29898 Other symptoms and signs involving the musculoskeletal system: Secondary | ICD-10-CM | POA: Diagnosis not present

## 2023-09-30 DIAGNOSIS — E0859 Diabetes mellitus due to underlying condition with other circulatory complications: Secondary | ICD-10-CM | POA: Diagnosis not present

## 2023-09-30 DIAGNOSIS — J302 Other seasonal allergic rhinitis: Secondary | ICD-10-CM | POA: Diagnosis not present

## 2023-09-30 DIAGNOSIS — T148XXA Other injury of unspecified body region, initial encounter: Secondary | ICD-10-CM | POA: Diagnosis not present

## 2023-09-30 DIAGNOSIS — R918 Other nonspecific abnormal finding of lung field: Secondary | ICD-10-CM | POA: Diagnosis not present

## 2023-09-30 DIAGNOSIS — F419 Anxiety disorder, unspecified: Secondary | ICD-10-CM | POA: Diagnosis not present

## 2023-09-30 DIAGNOSIS — J849 Interstitial pulmonary disease, unspecified: Secondary | ICD-10-CM | POA: Diagnosis not present

## 2023-09-30 DIAGNOSIS — F0280 Dementia in other diseases classified elsewhere without behavioral disturbance: Secondary | ICD-10-CM | POA: Diagnosis not present

## 2023-09-30 DIAGNOSIS — F329 Major depressive disorder, single episode, unspecified: Secondary | ICD-10-CM | POA: Diagnosis not present

## 2023-09-30 DIAGNOSIS — F411 Generalized anxiety disorder: Secondary | ICD-10-CM | POA: Diagnosis not present

## 2023-09-30 DIAGNOSIS — K219 Gastro-esophageal reflux disease without esophagitis: Secondary | ICD-10-CM | POA: Diagnosis not present

## 2023-09-30 DIAGNOSIS — I2609 Other pulmonary embolism with acute cor pulmonale: Secondary | ICD-10-CM | POA: Diagnosis not present

## 2023-09-30 DIAGNOSIS — J80 Acute respiratory distress syndrome: Secondary | ICD-10-CM | POA: Diagnosis not present

## 2023-09-30 DIAGNOSIS — I2699 Other pulmonary embolism without acute cor pulmonale: Secondary | ICD-10-CM | POA: Diagnosis not present

## 2023-09-30 DIAGNOSIS — I10 Essential (primary) hypertension: Secondary | ICD-10-CM | POA: Diagnosis not present

## 2023-09-30 DIAGNOSIS — J449 Chronic obstructive pulmonary disease, unspecified: Secondary | ICD-10-CM | POA: Diagnosis not present

## 2023-09-30 DIAGNOSIS — K5909 Other constipation: Secondary | ICD-10-CM | POA: Diagnosis not present

## 2023-09-30 DIAGNOSIS — R41841 Cognitive communication deficit: Secondary | ICD-10-CM | POA: Diagnosis not present

## 2023-09-30 MED ORDER — PREDNISONE 5 MG PO TABS
30.0000 mg | ORAL_TABLET | Freq: Every day | ORAL | Status: DC
  Administered 2023-09-30: 30 mg via ORAL
  Filled 2023-09-30: qty 2

## 2023-09-30 MED ORDER — APIXABAN 5 MG PO TABS: 5.0000 mg | ORAL_TABLET | Freq: Two times a day (BID) | ORAL | Status: DC

## 2023-09-30 MED ORDER — MIDODRINE HCL 5 MG PO TABS: 5.0000 mg | ORAL_TABLET | Freq: Three times a day (TID) | ORAL | Status: DC

## 2023-09-30 MED ORDER — SPIRONOLACTONE 25 MG PO TABS
25.0000 mg | ORAL_TABLET | Freq: Once | ORAL | Status: AC
  Administered 2023-09-30: 25 mg via ORAL
  Filled 2023-09-30: qty 1

## 2023-09-30 MED ORDER — PREDNISONE 5 MG PO TABS: ORAL_TABLET | ORAL | Status: DC

## 2023-09-30 MED ORDER — POTASSIUM CHLORIDE CRYS ER 20 MEQ PO TBCR
20.0000 meq | EXTENDED_RELEASE_TABLET | Freq: Once | ORAL | Status: AC
  Administered 2023-09-30: 20 meq via ORAL
  Filled 2023-09-30: qty 1

## 2023-09-30 MED ORDER — FUROSEMIDE 10 MG/ML IJ SOLN
20.0000 mg | Freq: Once | INTRAMUSCULAR | Status: AC
Start: 2023-09-30 — End: 2023-09-30
  Administered 2023-09-30: 20 mg via INTRAVENOUS
  Filled 2023-09-30: qty 2

## 2023-09-30 MED ORDER — ONDANSETRON 4 MG PO TBDP: 4.0000 mg | ORAL_TABLET | Freq: Three times a day (TID) | ORAL | Status: DC | PRN

## 2023-09-30 MED ORDER — METOPROLOL TARTRATE 25 MG PO TABS
25.0000 mg | ORAL_TABLET | Freq: Two times a day (BID) | ORAL | Status: DC
Start: 2023-09-30 — End: 2023-11-14

## 2023-09-30 NOTE — TOC Transition Note (Signed)
 Transition of Care [facility]) - Discharge Note   Patient Details  Name: Shelly [name] MRN: [id number] Date of Birth: [birth year]  Transition of Care [facility]) CM/SW Contact:  Adline Hook, LCSW Phone Number: 09/30/2023, 12:42 PM   Clinical Narrative:     Per MD patient ready for DC to [facility]. RN, patient, patient's family, and facility notified of DC. Discharge Summary and [facility] sent fax number [phone]. DC packet on chart. Insurance [employer] has been received.  Ambulance transport requested for patient.    RN to call report to amber at [phone], if no one answers call [phone].   CSW will sign off for now as social work intervention is no longer needed. Please consult us  again if new needs arise.   Final next level of care: Skilled Nursing Facility Barriers to Discharge: Barriers Resolved   Patient Goals and CMS Choice Patient states their goals for this hospitalization and ongoing recovery are:: Rehab CMS Medicare.gov Compare Post Acute Care list provided to:: Patient Choice offered to / list presented to : Patient [place] ownership interest in [facility].provided to:: Patient    Discharge Placement              Patient chooses bed at: [facility] Patient to be transferred to facility by: [facility] Name of family member notified: family Patient and family notified of of transfer: 09/30/23  Discharge Plan and Services Additional resources added to the After Visit Summary for   In-house Referral: Clinical Social Work   Post Acute Care Choice: Skilled Nursing Facility                    HH Arranged: PT, OT, RN, Disease Management, Nurse's Aide   Date HH Agency Contacted: 09/13/23 Time HH Agency Contacted: 1000 Representative spoke with at [facility] Agency: Randel Buss  Social Drivers of Health (SDOH) Interventions SDOH Screenings   Food Insecurity: No Food Insecurity (09/11/2023)  Housing: Low Risk  (09/11/2023)  Transportation Needs: No  Transportation Needs (09/11/2023)  Utilities: Not At Risk (09/11/2023)  Social Connections: Moderately Isolated (09/11/2023)  Tobacco Use: Medium Risk (09/11/2023)     Readmission Risk Interventions    09/12/2023    1:24 PM 09/05/2023    4:03 PM  Readmission Risk Prevention Plan  Transportation Screening Complete Complete  PCP or Specialist Appt within 5-7 Days  Complete  PCP or Specialist Appt within 3-5 Days Complete   Home Care Screening  Complete  Medication Review (RN CM)  Complete  HRI or Home Care Consult Complete   Social Work Consult for Recovery Care Planning/Counseling Complete   Palliative Care Screening Not Applicable   Medication Review Oceanographer) Referral to Pharmacy

## 2023-09-30 NOTE — Progress Notes (Signed)
 Physical Therapy Treatment Patient Details Name: Shelly [name] MRN: [id number] DOB: 1950-06-30 Today's Date: 09/30/2023   History of Present Illness 73 y.o. female admitted 09/11/23 with hypoxemia and PE. PMhx: Admission 3/26-4/2 with N/V, falls, CAP. Rt BKA, HTN, HLD, mild early Alzheimer's dementia, asthma, fibromyalgia, anxiety.    PT Comments  Pt greeted seated in recliner chair, pleasant and agreeable to PT session. She engaged in three short bouts of gait each followed by a ~57min seated rest. Pt ambulated a total of ~54ft using RW with CGA. She desaturated to 80% SpO2 while on 4L O2 and required a bump up to 8L and cues for PLB technique to recover to >88% SpO2. Pt experiences DOE and becomes tachycardic during gait. Discussed energy conservation techniques and activity pacing. Will continue to follow acutely and advance appropriately.    If plan is discharge home, recommend the following: Assist for transportation;Help with stairs or ramp for entrance;Assistance with cooking/housework;A little help with walking and/or transfers;A little help with bathing/dressing/bathroom   Can travel by private vehicle     Yes  Equipment Recommendations  None recommended by PT    Recommendations for Other Services       Precautions / Restrictions Precautions Precautions: Fall Recall of Precautions/Restrictions: Intact Precaution/Restrictions Comments: watch SpO2 Restrictions Weight Bearing Restrictions Per Provider Order: No     Mobility  Bed Mobility               General bed mobility comments: Not assessed. Pt greeted in recliner chair and returned there at end of session.    Transfers Overall transfer level: Needs assistance Equipment used: Rolling walker (2 wheels) Transfers: Sit to/from Stand Sit to Stand: Contact guard assist           General transfer comment: Pt stood from recliner chair. She demonstrated proper hand placement using RW. Good eccentric control with  sitting.    Ambulation/Gait Ambulation/Gait assistance: Contact guard assist Gait Distance (Feet): 40 Feet (1x30, seated rest, 1x40, seated rest, 1x10) Assistive device: Rolling walker (2 wheels) Gait Pattern/deviations: Step-through pattern, Decreased stride length, Trunk flexed Gait velocity: decreased Gait velocity interpretation: <1.31 ft/sec, indicative of household ambulator   General Gait Details: Pt ambulated with a reciprocal gait pattern, even weight shift, and good step length. She maintained body inside RW and navigated room/hallway well. Cued PLB techniques. Pt with mild coughing throughout gait. She reported DOE and required seated rest breaks between bouts.   Stairs             Wheelchair Mobility     Tilt Bed    Modified Rankin (Stroke Patients Only)       Balance Overall balance assessment: Needs assistance Sitting-balance support: No upper extremity supported, Feet supported Sitting balance-Leahy Scale: Good     Standing balance support: Bilateral upper extremity supported, Reliant on assistive device for balance Standing balance-Leahy Scale: Poor Standing balance comment: Pt dependent on RW                            Communication Communication Communication: No apparent difficulties  Cognition Arousal: Alert Behavior During Therapy: WFL for tasks assessed/performed   PT - Cognitive impairments: History of cognitive impairments, No apparent impairments                       PT - Cognition Comments: mild early Alzheimer's dementia per prior notes. Following commands: Intact  Cueing Cueing Techniques: Verbal cues  Exercises      General Comments General comments (skin integrity, edema, etc.): Pt greeted on 4L O2 via [place]. She destaturated to 80% SpO2 with gait, required bump up to 8L and cues for PLB technique to recover to >88% SpO2. Once seated and resting able to titrate pt back down to 4L. HR at rest 90s and with  activity 115-125bpm.      Pertinent Vitals/Pain Pain Assessment Pain Assessment: No/denies pain    Home Living                          Prior Function            PT Goals (current goals can now be found in the care plan section) Acute Rehab PT Goals Patient Stated Goal: Get better and move easier Progress towards PT goals: Progressing toward goals    Frequency    Min 2X/week      PT Plan      Co-evaluation              AM-PAC PT "6 Clicks" Mobility   Outcome Measure  Help needed turning from your back to your side while in a flat bed without using bedrails?: None Help needed moving from lying on your back to sitting on the side of a flat bed without using bedrails?: A Little Help needed moving to and from a bed to a chair (including a wheelchair)?: A Little Help needed standing up from a chair using your arms (e.g., wheelchair or bedside chair)?: A Little Help needed to walk in hospital room?: A Little Help needed climbing 3-5 steps with a railing? : Total 6 Click Score: 17    End of Session Equipment Utilized During Treatment: Gait belt;Oxygen  Activity Tolerance: Patient tolerated treatment well Patient left: in chair;with call bell/phone within reach;with chair alarm set Nurse Communication: Mobility status PT Visit Diagnosis: Other abnormalities of gait and mobility (R26.89);Muscle weakness (generalized) (M62.81);Difficulty in walking, not elsewhere classified (R26.2)     Time: 5366-4403 PT Time Calculation (min) (ACUTE ONLY): 25 min  Charges:    $Gait Training: 23-37 mins PT General Charges $$ ACUTE PT VISIT: 1 Visit                     Glenford Lanes, PT, DPT Acute Rehabilitation Services Office: [phone] Secure Chat Preferred  Riva Chester 09/30/2023, 1:29 PM

## 2023-09-30 NOTE — Progress Notes (Signed)
 Report called [facility]

## 2023-09-30 NOTE — Progress Notes (Signed)
 AVS in packet for transport.

## 2023-09-30 NOTE — Plan of Care (Signed)
 Pt has rested quietly throughout the night with no distress noted. Alert and oriented. On O2 2LNC. SR on the monitor. Purewick intact to suction. Medicated with tylenol  with relief noted. No other complaints voiced.     Problem: Education: Goal: Knowledge of General Education information will improve Description: Including pain rating scale, medication(s)/side effects and non-pharmacologic comfort measures Outcome: Progressing   Problem: Health Behavior/Discharge Planning: Goal: Ability to manage health-related needs will improve Outcome: Progressing   Problem: Clinical Measurements: Goal: Respiratory complications will improve Outcome: Progressing Goal: Cardiovascular complication will be avoided Outcome: Progressing   Problem: Nutrition: Goal: Adequate nutrition will be maintained Outcome: Progressing   Problem: Pain Managment: Goal: General experience of comfort will improve and/or be controlled Outcome: Progressing

## 2023-09-30 NOTE — Discharge Summary (Signed)
 Shelly [name] ZOX:[id number] DOB: [birth year] DOA: 09/11/2023  PCP: Faustina Hood, MD  Admit date: 09/11/2023  Discharge date: 09/30/2023  Admitted From: Home   Disposition:  SNF   Recommendations for Outpatient Follow-up:   Follow up with PCP in 1-2 weeks  PCP Please obtain BMP/CBC, 2 view CXR in 1week,  (see Discharge instructions)   PCP Please follow up on the following pending results:    Home Health: None   Equipment/Devices: None   Consultations: PCCM  Discharge Condition: Stable    CODE STATUS: Full    Diet Recommendation: Dysphagia 3 diet with 1.5 L fluid restriction per day    Chief Complaint  Patient presents with   Shortness of Breath     Brief history of present illness from the day of admission and additional interim summary    73 y.o.  female with history of dementia, right BKA, HTN, HLD, COPD-who was hospitalized from 3/26-4/2 for acute hypoxic respiratory failure secondary to rhinovirus/strep pneumonia infection-patient was stabilized-discharged on home oxygen -presented to the ED on 4/7 with worsening shortness of breath-found to be severely hypoxemic-CTA chest showed a small PE-she was started on heated high flow and subsequently admitted by the hospitalist service but due to worsening hypoxemia-she was transferred to the ICU for close monitoring.  She was stabilized and transferred back to [facility] on 4/10.   Significant events: 3/26-4/2>> hospitalization for hypoxia-rhinovirus/strep pneumo antigen positive-discharged on home O2 4/7>> worsening shortness of breath-worsening hypoxemia-admit to [facility] but later transferred to ICU 4/10>> transferred to [facility] 4/11>> O2 saturation in the 80s on 15 L of HFNC-CXR unchanged-switched to heated high flow-IV Lasix  started-prednisone  switched to  Solu-Medrol . 4/14>> switch to salter high flow 4/15>> down to 5 L salter high flow 4/17>> Down to 2-3 L of oxygen -but still gets short of breath with minimal ambulation requiring around 8-10 L of oxygen .   Significant studies: 4/7>> CTA chest: Few scattered segmental PE medial left upper lobe/middle lobe-changing parenchymal lung opacities with areas of interstitial septal thickening and groundglass opacities. 4/8>> echo: EF 65-70% 4/8>> B/L lower extremity Doppler: Age indeterminate DVT left peroneal vein   Significant microbiology data: 4/7>> COVID/influenza/RSV PCR: Negative 4/7>> sputum culture: Pending 4/7>> blood culture: No growth                                                                   Hospital Course    Acute hypoxic respiratory failure secondary to recent rhinovirus/strep pneumoniae-possible interstitial lung disease related to recent pneumonia-and now small PE  She required 10-15 liters of oxygen , now much improved after completing her antibiotic course, being placed on Eliquis  and intermittent diuresis with Lasix .  She was encouraged to sit in chair use I-S and flutter valve every 30 minutes while awake, much improved now, will still require  prednisone  taper over the next 2 weeks as outlined below.  Please continue to encourage her to use I-S and flutter valve sitting up every hour while awake at SNF, gently titrate down oxygen  as tolerated.  Overall significantly improved.   Patient at rest now requiring 2  L nasal cannula oxygen , upon ambulation pulse ox drops to 86% and she requires 3-4 L of nasal cannula oxygen  which gets better after 10 to 15 minutes of rest, she is saturating between 90 to 95% on 2 L nasal cannula oxygen  at rest.  She requires an SNF bed, has not picked up a facility yet according to the patient.   SpO2: 97 % O2 Flow Rate (L/min): 2 L/min FiO2 (%): 50 % (Titrated to 50%, O2 saturations 94%94)   Segmental left-sided pulmonary embolism with  left peroneal vein DVT  Continue Eliquis , post discharge follow-up with pulmonary in 1 to 2 months   Normocytic anemia Likely due to critical illness No evidence of blood loss Folate/vitamin B12 level stable. Follow CBC intermittently by PCP   Chronic HFpEF Euvolemic Lasix  being used as needed will give a dose on 09/30/2023 prior to her discharge.   COPD Treatment as above Continue bronchodilators, currently on 2 L nasal cannula oxygen  at rest and symptom-free.   HLD Statin   HTN BP now stable low-dose beta-blocker, gradually titrate off midodrine    Mood disorder Stable Continue Wellbutrin /Prozac    Dementia Delirium precautions Aricept  Stable  Discharge diagnosis     Principal Problem:   Acute respiratory failure with hypoxia (HCC) Active Problems:   Multifocal pneumonia   Sepsis (HCC)   Chest pain   Acute pulmonary embolism (HCC)   Diastolic dysfunction   Alzheimer's dementia (HCC)   History of right below knee amputation (HCC)   Anxiety and depression   Hyperlipidemia   ARDS (adult respiratory distress syndrome) (HCC)    Discharge instructions    Discharge Instructions     Discharge instructions   Complete by: As directed    Follow with Primary MD Faustina Hood, MD in 7 days   Get CBC, CMP, Magnesium , 2 view Chest X ray -  checked next visit with your primary MD or SNF MD  in 2-3 days  Activity: As tolerated with Full fall precautions use walker/cane & assistance as needed  Disposition SNF  Diet: Dysphagia 3 diet with 1.5 L fluid restriction per day, with feeding assistance and aspiration precautions.  Special Instructions: If you have smoked or chewed Tobacco  in the last 2 yrs please stop smoking, stop any regular Alcohol   and or any Recreational drug use.  On your next visit with your primary care physician please Get Medicines reviewed and adjusted.  Please request your Prim.MD to go over all Hospital Tests and Procedure/Radiological  results at the follow up, please get all Hospital records sent to your Prim MD by signing hospital release before you go home.  If you experience worsening of your admission symptoms, develop shortness of breath, life threatening emergency, suicidal or homicidal thoughts you must seek medical attention immediately by calling 911 or calling your MD immediately  if symptoms less severe.  You Must read complete instructions/literature along with all the possible adverse reactions/side effects for all the Medicines you take and that have been prescribed to you. Take any new Medicines after you have completely understood and accpet all the possible adverse reactions/side effects.   Do not drive when taking Pain medications.  Do not take more than prescribed Pain,  Sleep and Anxiety Medications  Wear Seat belts while driving.   Increase activity slowly   Complete by: As directed        Discharge Medications   Allergies as of 09/30/2023       Reactions   Delsym [dextromethorphan] Other (See Comments)   Cognitive impairment        Medication List     STOP taking these medications    ACETYL L-CARNITINE PO   ACIDOPHILUS PO   APPLE CIDER VINEGAR PO   BIOTIN PLUS KERATIN PO   FOLIC ACID-VIT B6-VIT B12 PO   guaiFENesin  600 MG 12 hr tablet Commonly known as: MUCINEX    losartan  50 MG tablet Commonly known as: COZAAR    senna-docusate 8.6-50 MG tablet Commonly known as: Senokot-S   THEANINE PO       TAKE these medications    acyclovir cream 5 % Commonly known as: ZOVIRAX Apply 1 Application topically 2 (two) times daily as needed (cold sore).   albuterol  108 (90 Base) MCG/ACT inhaler Commonly known as: ProAir  HFA 2 puffs every 4 hours as needed only  if your can't catch your breath What changed:  how much to take how to take this when to take this reasons to take this   apixaban  5 MG Tabs tablet Commonly known as: ELIQUIS  Take 1 tablet (5 mg total) by mouth 2  (two) times daily.   benzonatate  200 MG capsule Commonly known as: TESSALON  Take 1 capsule (200 mg total) by mouth 3 (three) times daily as needed for cough. TAKE 1 CAPSULE(200 MG) BY MOUTH THREE TIMES DAILY AS NEEDED FOR COUGH   budesonide -formoterol  160-4.5 MCG/ACT inhaler Commonly known as: Symbicort  Inhale 2 puffs into the lungs in the morning and at bedtime.   buPROPion  300 MG 24 hr tablet Commonly known as: WELLBUTRIN  XL Take 300 mg by mouth daily.   calcium  carbonate 1500 (600 Ca) MG Tabs tablet Commonly known as: OSCAL Take 600 mg of elemental calcium  by mouth at bedtime.   Centrum Silver Women 50+ Tabs Take 1 tablet by mouth daily.   denosumab  60 MG/ML Soln injection Commonly known as: PROLIA  Inject 60 mg into the skin every 6 (six) months.   donepezil  10 MG tablet Commonly known as: ARICEPT  Take 1 tablet (10 mg total) by mouth at bedtime.   FLUoxetine  20 MG capsule Commonly known as: PROZAC  Take 20 mg by mouth daily.   ipratropium-albuterol  0.5-2.5 (3) MG/3ML Soln Commonly known as: DUONEB Take 3 mLs by nebulization every 6 (six) hours as needed (Shortness of breath, cough and/or wheeze). Use this or albuterol  inhaler but not both.   MAGNESIUM  GLYCINATE PO Take 1 tablet by mouth at bedtime.   melatonin 3 MG Tabs tablet Take 3 mg by mouth at bedtime.   metoprolol tartrate 25 MG tablet Commonly known as: LOPRESSOR Take 1 tablet (25 mg total) by mouth 2 (two) times daily.   midodrine  5 MG tablet Commonly known as: PROAMATINE  Take 1 tablet (5 mg total) by mouth 3 (three) times daily with meals.   montelukast  10 MG tablet Commonly known as: SINGULAIR  TAKE 1 TABLET(10 MG) BY MOUTH DAILY What changed: See the new instructions.   ondansetron  4 MG disintegrating tablet Commonly known as: ZOFRAN -ODT Take 1 tablet (4 mg total) by mouth every 8 (eight) hours as needed for vomiting or nausea. What changed:  medication strength how much to take when to take  this   pantoprazole  40 MG tablet Commonly known as: PROTONIX  TAKE 30  TO 60 MINUTES BEFORE THE FIRST AND LAST MEAL OF THE DAY What changed:  how much to take how to take this when to take this additional instructions   Potassium 99 MG Tabs Take 99 mg by mouth at bedtime.   predniSONE  5 MG tablet Commonly known as: DELTASONE  Take  6 Pills PO for 4 days, 4 Pills PO for 4 days, 2 Pills PO for 4 days, 1 Pills PO for 4 days, 1/2 Pill  PO for 4 days then STOP. Start taking on: October 01, 2023   rosuvastatin  10 MG tablet Commonly known as: CRESTOR  Take 1 tablet (10 mg total) by mouth every evening. What changed: when to take this   TURMERIC CURCUMIN PO Take 1 tablet by mouth daily.   valACYclovir 1000 MG tablet Commonly known as: VALTREX Take 1,000 mg by mouth See admin instructions. Take 1 tablet (1000mg ) twice daily as needed for cold sores - continue until cold sores resolve.               Durable Medical Equipment  (From admission, onward)           Start     Ordered   09/25/23 0907  For home use only DME oxygen   Once       Question Answer Comment  Length of Need 6 Months   Mode or (Route) Nasal cannula   Liters per Minute 2   Frequency Continuous (stationary and portable oxygen  unit needed)   Oxygen  conserving device Yes   Oxygen  delivery system Gas      09/25/23 0906   09/21/23 1157  For home use only DME Walker rolling  Once       Question Answer Comment  Walker: With 5 Inch Wheels   Patient needs a walker to treat with the following condition Acute respiratory failure with hypoxia (HCC)      09/21/23 1157   09/21/23 1157  For home use only DME Bedside commode  Once       Question:  Patient needs a bedside commode to treat with the following condition  Answer:  Acute respiratory failure with hypoxia (HCC)   09/21/23 1157             Contact information for follow-up providers     [facility], [facility] Follow up.   Specialty: Home Health  Services Why: Someone will call you to schedule resumption of care visit. Contact information: [address] [place] [address] [phone]         Faustina Hood, MD. Schedule an appointment as soon as possible for a visit in 1 week(s).   Specialty: Family Medicine Contact information: [phone] [address] [place] [address] [phone]              Contact information for after-discharge care     Destination     [facility] .   Service: Skilled Nursing Contact information: [address] [place]  [phone]                     Major procedures and Radiology Reports - PLEASE review detailed and final reports thoroughly  -       DG Chest [facility] 1 View Result Date: 09/27/2023 CLINICAL DATA:  Shortness of breath EXAM: PORTABLE CHEST 1 VIEW COMPARISON:  09/24/2023 FINDINGS: Cardiac shadow is stable. Spinal stimulator is again noted. Bibasilar airspace opacities are again seen and stable. No new  focal infiltrate is noted. No effusion is seen. Bony structures appear within normal limits. IMPRESSION: Stable basilar airspace opacities Electronically Signed   By: Violeta Grey M.D.   On: 09/27/2023 11:21   DG Chest Port 1 View Result Date: 09/24/2023 CLINICAL DATA:  Shortness of breath EXAM: PORTABLE CHEST 1 VIEW COMPARISON:  09/17/2023 FINDINGS: Lungs are hypoinflated. Stable cardiomediastinal contours. Unchanged bilateral lower lung zone reticular interstitial opacities. Compared with the previous exam there has been no significant change in aeration to lungs. Visualized osseous structures appear unremarkable. IMPRESSION: 1. Persistent bilateral reticular interstitial opacities with a lower lung zone predominance. No new findings. Electronically Signed   By: Kimberley Penman M.D.   On: 09/24/2023 08:39   DG Chest Port 1 View Result Date: 09/17/2023 CLINICAL DATA:  Dyspnea EXAM: PORTABLE CHEST  1 VIEW COMPARISON:  September 15, 2023 FINDINGS: Low lung volumes. Similar distribution of the patchy airspace opacities throughout the lungs. No pneumothorax. Blunting of both costophrenic sulci, possible left and small pleural effusions. Unchanged cardiomegaly. Tortuous aorta with aortic atherosclerosis. Multilevel thoracic osteophytosis. Spinal stimulator device leads again noted in the mid thorax. IMPRESSION: No significant interval change to the airspace disease throughout the lungs. Electronically Signed   By: Rance Burrows M.D.   On: 09/17/2023 08:46   DG Chest Port 1V same Day Result Date: 09/15/2023 CLINICAL DATA:  141880 SOB (shortness of breath) [id number]. EXAM: PORTABLE CHEST 1 VIEW COMPARISON:  09/12/2023. FINDINGS: Low lung volume. Redemonstration of bilateral (left more than right) heterogeneous nonspecific opacities with relative sparing of the upper lung zones. No significant interval change. There is blunting of bilateral lateral costophrenic angles, which may represent underlying bilateral small pleural effusions. No pneumothorax. Stable cardio-mediastinal silhouette. No acute osseous abnormalities. The soft tissues are within normal limits. IMPRESSION: *No significant interval change in the bilateral heterogeneous opacities. Differential diagnosis includes multilobar pneumonia, ARDS, pulmonary edema, etc. Electronically Signed   By: Beula Brunswick M.D.   On: 09/15/2023 09:17   DG Swallowing Func-Speech Pathology Result Date: 09/14/2023 Table formatting from the original result was not included. Modified Barium Swallow Study Study completed and documented by Aurelia Leeks, SLP Student Supervised and reviewed by Noble Bateman MA CCC-SLP Patient Details Name: Shelly [name] MRN: [id number] Date of Birth: 1950-11-04 Today's Date: 09/14/2023 HPI/PMH: HPI: 73 yoF presenting from home with recurrent SOB and hypoxia.  Recently hospitalized 3/26- 4/2 for hypoxic respiratory failure felt related to  rhinovirus, RLL strep PNA +/- aspiration PNA (given N/V), possible AECOPD.  Discharged home on 3-4L [place] with 3 days left on steroid taper. Felt initially better but with progressive SOB, found hypoxic in the 60's on HOT w/ EMS improved on HFNC.  No reported recent fevers, increased cough, productive at times, denies any dysphagia, and complains slightly of left calf pain.  Placed on BiPAP for increased WOB in ER, requiring 60%.  Afebrile, low to normotensive BP.  CXR showing multifocal infiltrates.  Since in ER awaiting bed, unable to tolerate BiPAP wean due to WOB and hypoxia. CT angiogram of the chest revealed concerns for scattered left-sided pulmonary emboli with mild clot burden, and changing parenchymal lung opacities with areas of tissue and septal thickening left lung worse than the right. Pt evalauted by SLP in initial admission, no finding of dysphagia. Clinical Impression: Clinical Impression: Pt presents with swallowing grossly WFL. Oral phase of swallow featured brisk bolus transit and complete clearance of bolus with no residue. Pt displayed consistent delayed swallow initiation at level of pyriform  sinuses, which resulted in a single event of flash penetration with thin-liquids during multiple sips; pt states that she typically takes single sips when she drinks. Chin-tuck appeared to be effective for providing additional support for airway safety prior to swallow initiation. Hyolaryngeal excursion was adequate with complete epiglottic inversion. Pharyngeal residue was only trace. Tongue base retraction also appeared WFL. An esophageal sweep was completed with complete clearance of bolus and pill. Due to pt healing from oral lesions and lack of dentures during study (pt has dentures typically), only a small piece of solid was presented. Pt masticated solid completely and had no issues with the swallow. Recommend a dysphagia 3 (mech soft) and thin liquid diet. SLP will f/u to complete a diet check and to  provide education about compensatory strategies to optimize pt's wellbeing. Factors that may increase risk of adverse event in presence of aspiration Roderick Civatte & Jessy Morocco 2021): Factors that may increase risk of adverse event in presence of aspiration Roderick Civatte & Jessy Morocco 2021): Respiratory or GI disease Recommendations/Plan: Swallowing Evaluation Recommendations Swallowing Evaluation Recommendations Recommendations: PO diet PO Diet Recommendation: Dysphagia 3 (Mechanical soft); Thin liquids (Level 0) Liquid Administration via: Cup; Straw Medication Administration: Whole meds with liquid Supervision: Patient able to self-feed Swallowing strategies  : Slow rate; Chin tuck Postural changes: Position pt fully upright for meals Oral care recommendations: Oral care BID (2x/day) Treatment Plan Treatment Plan Treatment recommendations: Therapy as outlined in treatment plan below Follow-up recommendations: Follow physicians's recommendations for discharge plan and follow up therapies Treatment frequency: Min 1x/week Treatment duration: 2 weeks Interventions: Diet toleration management by SLP; Patient/family education Recommendations Recommendations for follow up therapy are one component of a multi-disciplinary discharge planning process, led by the attending physician.  Recommendations may be updated based on patient status, additional functional criteria and insurance authorization. Assessment: Orofacial Exam: Orofacial Exam Oral Cavity - Dentition: Dentures, not available; Missing dentition Anatomy: Anatomy: Prominent cricopharyngeus Boluses Administered: Boluses Administered Boluses Administered: Thin liquids (Level 0); Mildly thick liquids (Level 2, nectar thick); Puree; Solid  Oral Impairment Domain: Oral Impairment Domain Lip Closure: No labial escape Tongue control during bolus hold: Cohesive bolus between tongue to palatal seal Bolus preparation/mastication: Timely and efficient chewing and mashing Bolus  transport/lingual motion: Brisk tongue motion Oral residue: Complete oral clearance Location of oral residue : N/A Initiation of pharyngeal swallow : Pyriform sinuses  Pharyngeal Impairment Domain: Pharyngeal Impairment Domain Soft palate elevation: No bolus between soft palate (SP)/pharyngeal wall (PW) Laryngeal elevation: Complete superior movement of thyroid  cartilage with complete approximation of arytenoids to epiglottic petiole Anterior hyoid excursion: Complete anterior movement Epiglottic movement: Complete inversion Laryngeal vestibule closure: Incomplete, narrow column air/contrast in laryngeal vestibule Pharyngeal stripping wave : Present - complete Pharyngeal contraction (A/P view only): N/A Pharyngoesophageal segment opening: Complete distension and complete duration, no obstruction of flow Tongue base retraction: No contrast between tongue base and posterior pharyngeal wall (PPW) Pharyngeal residue: Trace residue within or on pharyngeal structures Location of pharyngeal residue: Valleculae  Esophageal Impairment Domain: Esophageal Impairment Domain Esophageal clearance upright position: Complete clearance, esophageal coating Pill: Pill Consistency administered: Thin liquids (Level 0) Thin liquids (Level 0): [facility] Penetration/Aspiration Scale Score: Penetration/Aspiration Scale Score 1.  Material does not enter airway: Mildly thick liquids (Level 2, nectar thick); Puree; Solid; Pill 2.  Material enters airway, remains ABOVE vocal cords then ejected out: Thin liquids (Level 0) Compensatory Strategies: Compensatory Strategies Compensatory strategies: Yes Chin tuck: Effective   General Information: Caregiver present: No  No data recorded  Temperature : Normal   Respiratory Status: WFL   Supplemental O2: High flow nasal cannula   History of Recent Intubation: No  Behavior/Cognition: Alert; Cooperative; Pleasant mood Self-Feeding Abilities: Able to self-feed Baseline vocal quality/speech: Normal Volitional  Cough: Able to elicit Volitional Swallow: Able to elicit Exam Limitations: No limitations Goal Planning: Prognosis for improved oropharyngeal function: Good No data recorded No data recorded No data recorded Consulted and agree with results and recommendations: Patient Pain: Pain Assessment Pain Assessment: No/denies pain Faces Pain Scale: 4 Pain Location: buttocks Pain Descriptors / Indicators: Sore Pain Intervention(s): Monitored during session; Repositioned; Other (comment) (pillows, upright position in chair with sacrum placed against back rest of chair.) End of Session: Start Time:SLP Start Time (ACUTE ONLY): 1210 Stop Time: SLP Stop Time (ACUTE ONLY): 1228 Time Calculation:SLP Time Calculation (min) (ACUTE ONLY): 18 min Charges: SLP Evaluations $ SLP Speech Visit: 1 Visit SLP Evaluations $MBS Swallow: 1 Procedure $Swallowing Treatment: 1 Procedure SLP visit diagnosis: No data recorded Past Medical History: Past Medical History: Diagnosis Date  Anxiety   Arthritis   Asthma   Avascular necrosis of [place] (HCC)   Cancer (HCC)   vulva pre cancer had surgery  Depression   Gait abnormality 03/20/2017  GERD (gastroesophageal reflux disease)   Hearing loss   "very minor"  History of pneumonia   Hyperlipidemia   Hypertension   Leg edema   left  Memory difficulty 03/20/2017  PONV (postoperative nausea and vomiting)   Stress incontinence  Past Surgical History: Past Surgical History: Procedure Laterality Date  ANKLE FUSION  2011  right multiple   ANKLE FUSION    rear ankle fusion  ANKLE SURGERY  2010  right cordicompression  AORTIC ARCH ANGIOGRAPHY N/A 12/11/2017  Procedure: AORTIC ARCH ANGIOGRAPHY;  Surgeon: Richrd Char, MD;  Location: [facility];  Service: Cardiovascular;  Laterality: N/A;  APPENDECTOMY  05/10/2016  BELOW KNEE LEG AMPUTATION    CATARACT EXTRACTION W/ INTRAOCULAR LENS IMPLANT Left   LAPAROSCOPIC APPENDECTOMY N/A 05/10/2016  Procedure: APPENDECTOMY LAPAROSCOPIC;  Surgeon: Oza Blumenthal,  MD;  Location: [facility] OR;  Service: General;  Laterality: N/A;  LUMBAR LAMINECTOMY/DECOMPRESSION MICRODISCECTOMY N/A 09/09/2015  Procedure:  L4-S1 Decompression/ Discetomy;  Surgeon: Mort Ards, MD;  Location: [facility] OR;  Service: Orthopedics;  Laterality: N/A;  SKIN GRAFT    SPINAL CORD STIMULATOR BATTERY EXCHANGE  06/23/2023  SPINAL CORD STIMULATOR INSERTION N/A 01/10/2019  Procedure: LUMBAR SPINAL CORD STIMULATOR INSERTION;  Surgeon: Mort Ards, MD;  Location: [facility] OR;  Service: Orthopedics;  Laterality: N/A;  2.5 hrs  TONSILLECTOMY    TUBAL LIGATION  1983  VULVECTOMY N/A 01/28/2015  Procedure: WIDE EXCISION VULVECTOMY;  Surgeon: Johnn Najjar, MD;  Location: [facility];  Service: Gynecology;  Laterality: N/A; DeBlois, Hardin Leys 09/14/2023, 2:30 PM  VAS US  LOWER EXTREMITY VENOUS (DVT) Result Date: 09/12/2023  Lower Venous DVT Study Patient Name:  Shelly [name]  Date of Exam:   09/12/2023 Medical Rec #: [id number]          Accession #:    [id number] Date of Birth: 02-12-1951          Patient Gender: F Patient Age:   68 years Exam Location:  [facility] Procedure:      VAS US  LOWER EXTREMITY VENOUS (DVT) Referring Phys: PAULA SIMPSON --------------------------------------------------------------------------------  Indications: SOB, and pulmonary embolism.  Comparison Study: Previous study on 10.4.2024. Performing Technologist: Ria Chad  Examination Guidelines: A complete evaluation includes B-mode imaging, spectral Doppler, color Doppler, and  power Doppler as needed of all accessible portions of each vessel. Bilateral testing is considered an integral part of a complete examination. Limited examinations for reoccurring indications may be performed as noted. The reflux portion of the exam is performed with the patient in reverse Trendelenburg.  +---------+---------------+---------+-----------+----------+--------------+ RIGHT    CompressibilityPhasicitySpontaneityPropertiesThrombus Aging  +---------+---------------+---------+-----------+----------+--------------+ CFV      Full           Yes      Yes                                 +---------+---------------+---------+-----------+----------+--------------+ SFJ      Full           Yes      Yes                                 +---------+---------------+---------+-----------+----------+--------------+ FV Prox  Full                                                        +---------+---------------+---------+-----------+----------+--------------+ FV Mid   Full                                                        +---------+---------------+---------+-----------+----------+--------------+ FV DistalFull                                                        +---------+---------------+---------+-----------+----------+--------------+ PFV      Full                                                        +---------+---------------+---------+-----------+----------+--------------+ POP      Full           Yes      Yes                                 +---------+---------------+---------+-----------+----------+--------------+ Right BKA  +---------+---------------+---------+-----------+----------+--------------+ LEFT     CompressibilityPhasicitySpontaneityPropertiesThrombus Aging +---------+---------------+---------+-----------+----------+--------------+ CFV      Full           Yes      Yes                                 +---------+---------------+---------+-----------+----------+--------------+ SFJ      Full           Yes      Yes                                 +---------+---------------+---------+-----------+----------+--------------+ FV Prox  Full                                                        +---------+---------------+---------+-----------+----------+--------------+  FV Mid   Full                                                         +---------+---------------+---------+-----------+----------+--------------+ FV DistalFull                                                        +---------+---------------+---------+-----------+----------+--------------+ PFV      Full                                                        +---------+---------------+---------+-----------+----------+--------------+ POP      Full           Yes      Yes                                 +---------+---------------+---------+-----------+----------+--------------+ PTV      Full                                                        +---------+---------------+---------+-----------+----------+--------------+ PERO     None           No       No                                  +---------+---------------+---------+-----------+----------+--------------+ Only one of the paired peroneal veins is thrombosed.    Summary: RIGHT: - There is no evidence of deep vein thrombosis in the lower extremity.  - No cystic structure found in the popliteal fossa.  LEFT: - Findings consistent with age indeterminate deep vein thrombosis involving the left peroneal veins.  - No cystic structure found in the popliteal fossa.  *See table(s) above for measurements and observations. Electronically signed by Angela Kell MD on 09/12/2023 at 7:46:34 PM.    Final    ECHOCARDIOGRAM LIMITED Result Date: 09/12/2023    ECHOCARDIOGRAM LIMITED REPORT   Patient Name:   Shelly [name] Date of Exam: 09/12/2023 Medical Rec #:  [id number]         Height:       65.0 in Accession #:    [id number]        Weight:       148.4 lb Date of Birth:  11-29-1950         BSA:          1.742 m Patient Age:    72 years          BP:           91/42 mmHg Patient Gender: F                 HR:  90 bpm. Exam Location:  Inpatient Procedure: Limited Echo (Both Spectral and Color Flow Doppler were utilized            during procedure). Indications:    Pulmonary Embolism  History:        Patient  has prior history of Echocardiogram examinations, most                 recent 08/31/2023. Risk Factors:Hypertension.  Sonographer:    Astrid Blamer Referring Phys: [id number] RONDELL A SMITH IMPRESSIONS  1. Left ventricular ejection fraction, by estimation, is 65 to 70%. The left ventricle has normal function.  2. Basal function is preserved. Right ventricular systolic function was not well visualized. The right ventricular size is not well visualized.  3. The mitral valve is grossly normal.  4. Limited study. Comparison(s): No significant change from prior study. Prior images reviewed side by side. FINDINGS  Left Ventricle: Left ventricular ejection fraction, by estimation, is 65 to 70%. The left ventricle has normal function. The left ventricular internal cavity size was normal in size. There is no left ventricular hypertrophy. Right Ventricle: Basal function is preserved. The right ventricular size is not well visualized. Right ventricular systolic function was not well visualized. Left Atrium: Left atrial size was normal in size. Right Atrium: Right atrial size was normal in size. Pericardium: There is no evidence of pericardial effusion. Mitral Valve: The mitral valve is grossly normal. Aorta: The aortic root and ascending aorta are structurally normal, with no evidence of dilitation. LEFT VENTRICLE PLAX 2D LVIDd:         3.80 cm LVIDs:         2.00 cm LV PW:         0.70 cm LV IVS:        0.90 cm LVOT diam:     1.70 cm LVOT Area:     2.27 cm  LEFT ATRIUM             Index LA Vol (A2C):   33.6 ml 19.28 ml/m LA Vol (A4C):   33.5 ml 19.23 ml/m LA Biplane Vol: 36.7 ml 21.06 ml/m   AORTA Ao Root diam: 2.80 cm  SHUNTS Systemic Diam: 1.70 cm Shelly Larger MD Electronically signed by Shelly Larger MD Signature Date/Time: 09/12/2023/10:19:07 AM    Final    DG Chest Port 1 View Result Date: 09/12/2023 CLINICAL DATA:  Respiratory failure. EXAM: PORTABLE CHEST 1 VIEW COMPARISON:  September 11, 2023. FINDINGS:  Stable cardiomediastinal silhouette. Stable bilateral lung opacities are noted concerning for pneumonia. Bony thorax is unremarkable. Elevated left hemidiaphragm. IMPRESSION: Stable bilateral lung opacities are noted concerning for multifocal pneumonia. Electronically Signed   By: Rosalene Colon M.D.   On: 09/12/2023 09:50   CT Angio Chest Pulmonary Embolism (PE) W or WO Contrast Result Date: 09/11/2023 CLINICAL DATA:  Shortness of breath. Questionable sepsis. Positive D-dimer EXAM: CT ANGIOGRAPHY CHEST WITH CONTRAST TECHNIQUE: Multidetector CT imaging of the chest was performed using the standard protocol during bolus administration of intravenous contrast. Multiplanar CT image reconstructions and MIPs were obtained to evaluate the vascular anatomy. RADIATION DOSE REDUCTION: This exam was performed according to the departmental dose-optimization program which includes automated exposure control, adjustment of the mA and/or kV according to patient size and/or use of iterative reconstruction technique. CONTRAST:  75mL OMNIPAQUE  IOHEXOL  350 MG/ML SOLN COMPARISON:  X-ray earlier 09/11/2023.  CTA chest 01/11/2019. FINDINGS: Cardiovascular: Trace pericardial fluid. Heart nonenlarged. The thoracic aorta is normal course and caliber with  calcified scattered atherosclerotic plaque. There is plaque extending along the great vessels as well. Some enlargement of the pulmonary artery. Please correlate for pulmonary artery hypertension. There is some pulmonary emboli identified. These include areas along the medial aspect of the lower middle lobe, anteromedial left upper lobe. Few smaller areas as well. Overall mild clot burden. Mediastinum/Nodes: Preserved thyroid  gland. Patulous esophagus. There are some small less than 1 cm in size mediastinal nodes identified, nonpathologic by size criteria. There are some calcified nodes as well including in the subcarinal region of the mediastinum and left hilum consistent with old  granulomatous disease. There also several mildly enlarged mediastinal nodes. Example towards the AP window measuring 16 by 10 mm. Paratracheal nodes are seen, subcarinal. No specific abnormal lymph node enlargement in the axillary regions or hila. Lungs/Pleura: Extensive patchy areas of ground-glass with interstitial septal thickening, possible components of crazy paving. Distribution includes diffusely in the left lung with some mild areas of sparing at the left lung apex and superior segment of the left lower lobe medially. Less areas in the right lung with some specific areas such as right lung apex as well as more confluent areas in the right lower lobe with patchy areas in the middle lobe. These are new from the remote CT scan of the chest compared to the abdomen pelvis CT of 08/30/2023 the lung bases the opacities have changed. Less confluent at the right lower lobe with new areas elsewhere in the visualized thorax. No frank consolidation. No pneumothorax or effusion. Upper Abdomen: Adrenal glands are preserved in the upper abdomen. Hepatic and splenic granulomas are identified. Musculoskeletal: Mild curvature of the spine. Moderate degenerative changes scattered along the spine. There is neurostimulator leads extending along the central canal of the spine. Battery pack is not included in the imaging field nor is the entirety of the soft tissue course of the leads. Review of the MIP images confirms the above findings. IMPRESSION: Few scattered segmental pulmonary emboli such as medial left upper lobe and middle lobe. Mild clot burden. Changing parenchymal lung opacities with areas of interstitial septal thickening ground-glass today. Almost crazy paving in appearance. More left lung than right. In addition there are several enlarged mediastinal nodes. These could be reactive with the lung findings. Please correlate with clinical presentation and follow-up. Some enlargement of the main pulmonary arteries.  Please correlate for pulmonary artery hypertension. Evidence of old granulomatous disease. Critical Value/emergent results were called by telephone at the time of interpretation on 09/11/2023 at 1:44 pm to provider PA Randsom , who verbally acknowledged these results. Aortic Atherosclerosis (ICD10-I70.0). Electronically Signed   By: Adrianna Horde M.D.   On: 09/11/2023 13:48   DG Chest Port 1 View Result Date: 09/11/2023 CLINICAL DATA:  Evaluate shortness of breath.  Questionable sepsis. EXAM: PORTABLE CHEST 1 VIEW COMPARISON:  09/04/2023 FINDINGS: Stable cardiomediastinal contours. Progressive bilateral airspace opacities. There is been increase in left mid and left lower lung airspace disease with persistent opacification of the right lower lung. No pneumothorax. No significant pleural effusion. Thoracic scoliosis. Dorsal column stimulator noted with lead terminating in the midthoracic spine. IMPRESSION: Progressive bilateral airspace opacities compatible with multifocal pneumonia. Electronically Signed   By: Kimberley Penman M.D.   On: 09/11/2023 05:55   DG Chest 2 View Result Date: 09/04/2023 CLINICAL DATA:  Hypoxia. EXAM: CHEST - 2 VIEW COMPARISON:  Chest radiograph dated 09/03/2023 FINDINGS: No significant interval change in bilateral mid to lower lung field airspace densities. No pneumothorax. Stable cardiomediastinal silhouette.  No acute osseous pathology. IMPRESSION: No significant interval change in bilateral mid to lower lung field airspace densities. Electronically Signed   By: Angus Bark M.D.   On: 09/04/2023 14:31   DG CHEST PORT 1 VIEW Result Date: 09/03/2023 CLINICAL DATA:  Shortness of breath EXAM: PORTABLE CHEST 1 VIEW COMPARISON:  08/30/2023 FINDINGS: Heart and mediastinal contours within normal limits. Worsening consolidation in the right lower lobe and lingula compatible with pneumonia. Left mid lung nodule again noted, unchanged. No visible effusion or acute bony abnormality.  IMPRESSION: Worsening right lower lobe and left perihilar/lingular consolidation concerning for pneumonia. Electronically Signed   By: Janeece Mechanic M.D.   On: 09/03/2023 00:16   ECHOCARDIOGRAM COMPLETE Result Date: 08/31/2023    ECHOCARDIOGRAM REPORT   Patient Name:   Debora Mekonnen Date of Exam: 08/31/2023 Medical Rec #:  [id number]         Height:       65.0 in Accession #:    [id number]        Weight:       165.1 lb Date of Birth:  09-08-1950         BSA:          1.823 m Patient Age:    72 years          BP:           91/47 mmHg Patient Gender: F                 HR:           93 bpm. Exam Location:  Inpatient Procedure: 2D Echo, Cardiac Doppler and Color Doppler (Both Spectral and Color            Flow Doppler were utilized during procedure). Indications:    Chest Pain  History:        Patient has no prior history of Echocardiogram examinations.                 Risk Factors:Hypertension.  Sonographer:    Astrid Blamer Referring Phys: Bernadine Melecio [facility] IMPRESSIONS  1. Left ventricular ejection fraction, by estimation, is 60 to 65%. The left ventricle has normal function. The left ventricle has no regional wall motion abnormalities. There is mild left ventricular hypertrophy of the basal-septal segment. Left ventricular diastolic parameters are consistent with Grade I diastolic dysfunction (impaired relaxation).  2. Right ventricular systolic function is normal. The right ventricular size is normal.  3. The mitral valve is normal in structure. No evidence of mitral valve regurgitation. No evidence of mitral stenosis.  4. The aortic valve is normal in structure. Aortic valve regurgitation is not visualized. No aortic stenosis is present.  5. The inferior vena cava is normal in size with greater than 50% respiratory variability, suggesting right atrial pressure of 3 mmHg. FINDINGS  Left Ventricle: Left ventricular ejection fraction, by estimation, is 60 to 65%. The left ventricle has normal function. The left  ventricle has no regional wall motion abnormalities. The left ventricular internal cavity size was normal in size. There is  mild left ventricular hypertrophy of the basal-septal segment. Left ventricular diastolic parameters are consistent with Grade I diastolic dysfunction (impaired relaxation). Right Ventricle: The right ventricular size is normal. No increase in right ventricular wall thickness. Right ventricular systolic function is normal. Left Atrium: Left atrial size was normal in size. Right Atrium: Right atrial size was normal in size. Pericardium: There is no evidence of pericardial effusion. Mitral Valve: The  mitral valve is normal in structure. No evidence of mitral valve regurgitation. No evidence of mitral valve stenosis. Tricuspid Valve: The tricuspid valve is normal in structure. Tricuspid valve regurgitation is not demonstrated. No evidence of tricuspid stenosis. Aortic Valve: The aortic valve is normal in structure. Aortic valve regurgitation is not visualized. No aortic stenosis is present. Aortic valve mean gradient measures 5.0 mmHg. Aortic valve peak gradient measures 9.5 mmHg. Aortic valve area, by VTI measures 1.96 cm. Pulmonic Valve: The pulmonic valve was normal in structure. Pulmonic valve regurgitation is not visualized. No evidence of pulmonic stenosis. Aorta: The aortic root is normal in size and structure. Venous: The inferior vena cava is normal in size with greater than 50% respiratory variability, suggesting right atrial pressure of 3 mmHg. IAS/Shunts: No atrial level shunt detected by color flow Doppler.  LEFT VENTRICLE PLAX 2D LVIDd:         2.80 cm   Diastology LVIDs:         1.90 cm   LV e' medial:    11.40 cm/s LV PW:         0.60 cm   LV E/e' medial:  9.0 LV IVS:        1.20 cm   LV e' lateral:   13.30 cm/s LVOT diam:     1.70 cm   LV E/e' lateral: 7.7 LV SV:         58 LV SV Index:   32 LVOT Area:     2.27 cm  RIGHT VENTRICLE RV S prime:     14.50 cm/s TAPSE (M-mode): 2.8  cm LEFT ATRIUM             Index        RIGHT ATRIUM           Index LA Vol (A2C):   38.0 ml 20.84 ml/m  RA Area:     12.40 cm LA Vol (A4C):   22.7 ml 12.45 ml/m  RA Volume:   27.90 ml  15.30 ml/m LA Biplane Vol: 28.7 ml 15.74 ml/m  AORTIC VALVE AV Area (Vmax):    1.74 cm AV Area (Vmean):   1.89 cm AV Area (VTI):     1.96 cm AV Vmax:           154.00 cm/s AV Vmean:          109.000 cm/s AV VTI:            0.298 m AV Peak Grad:      9.5 mmHg AV Mean Grad:      5.0 mmHg LVOT Vmax:         118.00 cm/s LVOT Vmean:        90.600 cm/s LVOT VTI:          0.257 m LVOT/AV VTI ratio: 0.86  AORTA Ao Root diam: 2.80 cm MITRAL VALVE MV Area (PHT): 2.91 cm     SHUNTS MV Decel Time: 261 msec     Systemic VTI:  0.26 m MV E velocity: 103.00 cm/s  Systemic Diam: 1.70 cm MV A velocity: 130.00 cm/s MV E/A ratio:  0.79 Dorothye Gathers MD Electronically signed by Dorothye Gathers MD Signature Date/Time: 08/31/2023/2:31:14 PM    Final     Micro Results     No results found for this or any previous visit (from the past 240 hours).  Today   Subjective    Darice Edelman today has no headache,no chest abdominal pain,no new weakness tingling or  numbness, feels much better wants to go home today.     Objective   Blood pressure (!) 129/59, pulse 61, temperature (!) 97.5 F (36.4 C), temperature source Oral, resp. rate 15, height 5\' 5"  (1.651 m), weight 67.9 kg, SpO2 97%.   Intake/Output Summary (Last 24 hours) at 09/30/2023 0754 Last data filed at 09/30/2023 0600 Gross per 24 hour  Intake 240 ml  Output 1100 ml  Net -860 ml    Exam  Awake Alert, No new F.N deficits,    [place].AT,PERRAL Supple Neck,   Symmetrical Chest wall movement, Good air movement bilaterally, CTAB RRR,No Gallops,   +ve B.Sounds, Abd Soft, Non tender,  No Cyanosis, Clubbing or edema    Data Review   Recent Labs  Lab 09/25/23 0436 09/28/23 0450  WBC 13.2* 16.1*  HGB 10.1* 11.0*  HCT 31.4* 33.4*  PLT 314 322  MCV 96.3 95.4  MCH 31.0 31.4   MCHC 32.2 32.9  RDW 14.0 13.9  LYMPHSABS 2.4 2.3  MONOABS 0.9 0.9  EOSABS 0.5 0.1  BASOSABS 0.0 0.0    Recent Labs  Lab 09/25/23 0436 09/28/23 0450  NA 137 135  K 4.2 3.9  CL 99 96*  CO2 32 28  ANIONGAP 6 11  GLUCOSE 81 92  BUN 14 20  CREATININE 0.62 0.69  CRP 0.7  --   PROCALCITON <0.10  --   BNP 20.5 20.9  MG 2.0 1.9  CALCIUM  8.9 8.9    Total Time in preparing paper work, data evaluation and todays exam - 35 minutes  Signature  -    Lynnwood Sauer M.D on 09/30/2023 at 7:54 AM   -  To page go to [email or web address]

## 2023-10-02 ENCOUNTER — Encounter: Admitting: Physical Therapy

## 2023-10-02 ENCOUNTER — Non-Acute Institutional Stay (SKILLED_NURSING_FACILITY): Payer: Self-pay | Admitting: Sports Medicine

## 2023-10-02 ENCOUNTER — Encounter: Payer: Self-pay | Admitting: Sports Medicine

## 2023-10-02 DIAGNOSIS — I2609 Other pulmonary embolism with acute cor pulmonale: Secondary | ICD-10-CM | POA: Diagnosis not present

## 2023-10-02 DIAGNOSIS — J302 Other seasonal allergic rhinitis: Secondary | ICD-10-CM

## 2023-10-02 DIAGNOSIS — I10 Essential (primary) hypertension: Secondary | ICD-10-CM

## 2023-10-02 DIAGNOSIS — M810 Age-related osteoporosis without current pathological fracture: Secondary | ICD-10-CM | POA: Diagnosis not present

## 2023-10-02 DIAGNOSIS — K219 Gastro-esophageal reflux disease without esophagitis: Secondary | ICD-10-CM | POA: Diagnosis not present

## 2023-10-02 DIAGNOSIS — F329 Major depressive disorder, single episode, unspecified: Secondary | ICD-10-CM

## 2023-10-02 DIAGNOSIS — J9601 Acute respiratory failure with hypoxia: Secondary | ICD-10-CM | POA: Diagnosis not present

## 2023-10-02 NOTE — Progress Notes (Signed)
 Provider:  Dr. Tye Gall Location:  [facility] Place of Service:   Skilled care    PCP: Faustina Hood, MD Patient Care Team: Faustina Hood, MD as PCP - General (Family Medicine) Wenona Hamilton, MD as PCP - Cardiology (Cardiology) Adelaide Adjutant, MD as Consulting Physician (Physical Medicine and Rehabilitation)  Extended Emergency Contact Information Primary Emergency Contact: May,Brian Address: [address]          [place], [place] [address] [place] Home Phone: [phone] Work Phone: [phone] Mobile Phone: [phone] Relation: Brother Secondary Emergency Contact: graham,marisa Mobile Phone: [phone] Relation: Daughter  Goals of Care: Advanced Directive information    09/11/2023   10:50 AM  Advanced Directives  Does Patient Have a Medical Advance Directive? Yes  Type of Advance Directive Healthcare Power of Attorney  Does patient want to make changes to medical advance directive? Yes (Inpatient - patient defers changing a medical advance directive and declines information at this time)  Copy of Healthcare Power of Attorney in Chart? No - copy requested         History of Present Illness  HPI: Patient is a 73 y.o. female seen today for admission to [facility].   Patient seen and examined in her room.  She is sitting in her recliner chair reading on her Kindle. She is able to speak in full sentences, does not appear to be in distress.  She is currently on 3 L nasal cannula.  As per nursing staff the patient was hypoxic with her O2 sat dropping to 80s when she was working with physical therapy this morning. Patient denies chest pain but complains of exertional shortness of breath.  She is requiring 1 person assistance with transferring and ambulating. Complains of intermittent dry cough. Denies chest pain, orthopnea, PND, dizziness.  Gerd Patient reports that she is not eating much.  She had a vomiting  just a while ago.  Denies abdominal pain.  Ate her breakfast this morning with no vomitings.  History of PE Denies chest pain, palpitations Currently on Eliquis  No signs of bleeding  Constipation Patient complains of constipation.  Moves her bowels every other day.  Last bowel movement yesterday  Past Medical History:  Diagnosis Date   Anxiety    Arthritis    Asthma    Avascular necrosis of talus (HCC)    Cancer (HCC)    vulva pre cancer had surgery   Depression    Gait abnormality 03/20/2017   GERD (gastroesophageal reflux disease)    Hearing loss    "very minor"   History of pneumonia    Hyperlipidemia    Hypertension    Leg edema    left   Memory difficulty 03/20/2017   PONV (postoperative nausea and vomiting)    Stress incontinence    Past Surgical History:  Procedure Laterality Date   ANKLE FUSION  2011   right multiple    ANKLE FUSION     rear ankle fusion   ANKLE SURGERY  2010   right cordicompression   AORTIC ARCH ANGIOGRAPHY N/A 12/11/2017   Procedure: AORTIC ARCH ANGIOGRAPHY;  Surgeon: Richrd Char, MD;  Location: [facility] INVASIVE CV LAB;  Service: Cardiovascular;  Laterality: N/A;   APPENDECTOMY  05/10/2016   BELOW KNEE LEG AMPUTATION     CATARACT EXTRACTION W/ INTRAOCULAR LENS IMPLANT Left    LAPAROSCOPIC APPENDECTOMY N/A 05/10/2016   Procedure: APPENDECTOMY LAPAROSCOPIC;  Surgeon: Oza Blumenthal, MD;  Location: [facility] OR;  Service: General;  Laterality: N/A;   LUMBAR LAMINECTOMY/DECOMPRESSION MICRODISCECTOMY N/A 09/09/2015   Procedure:  L4-S1 Decompression/ Discetomy;  Surgeon: Mort Ards, MD;  Location: [facility] OR;  Service: Orthopedics;  Laterality: N/A;   SKIN GRAFT     SPINAL CORD STIMULATOR BATTERY EXCHANGE  06/23/2023   SPINAL CORD STIMULATOR INSERTION N/A 01/10/2019   Procedure: LUMBAR SPINAL CORD STIMULATOR INSERTION;  Surgeon: Mort Ards, MD;  Location: [facility] OR;  Service: Orthopedics;  Laterality: N/A;  2.5 hrs   TONSILLECTOMY     TUBAL  LIGATION  1983   VULVECTOMY N/A 01/28/2015   Procedure: WIDE EXCISION VULVECTOMY;  Surgeon: Johnn Najjar, MD;  Location: [facility] ORS;  Service: Gynecology;  Laterality: N/A;    reports that she quit smoking about 16 years ago. Her smoking use included cigarettes. She started smoking about 56 years ago. She has a 40 pack-year smoking history. She has never used smokeless tobacco. She reports current alcohol  use. She reports that she does not use drugs. Social History   Socioeconomic History   Marital status: Divorced    Spouse name: Not on file   Number of children: 3   Years of education: 14   Highest education level: Not on file  Occupational History   Occupation: ACCOUNTING    Employer: [employer]  Tobacco Use   Smoking status: Former    Current packs/day: 0.00    Average packs/day: 1 pack/day for 40.0 years (40.0 ttl pk-yrs)    Types: Cigarettes    Start date: 02/05/1967    Quit date: 02/05/2007    Years since quitting: 16.6   Smokeless tobacco: Never  Vaping Use   Vaping status: Never Used  Substance and Sexual Activity   Alcohol  use: Yes    Alcohol /week: 0.0 standard drinks of alcohol     Comment: socially   Drug use: No   Sexual activity: Not on file  Other Topics Concern   Not on file  Social History Narrative   Lives alone   Caffeine use: Tea every morning    Right handed    Social Drivers of Health   Financial Resource Strain: Not on file  Food Insecurity: No Food Insecurity (09/11/2023)   Hunger Vital Sign    Worried About Running Out of Food in the Last Year: Never true    Ran Out of Food in the Last Year: Never true  Transportation Needs: No Transportation Needs (09/11/2023)   PRAPARE - Administrator, Civil Service (Medical): No    Lack of Transportation (Non-Medical): No  Physical Activity: Not on file  Stress: Not on file  Social Connections: Moderately Isolated (09/11/2023)   Social Connection and Isolation Panel [NHANES]    Frequency of  Communication with Friends and Family: More than three times a week    Frequency of Social Gatherings with Friends and Family: Once a week    Attends Religious Services: Never    Database administrator or Organizations: No    Attends Banker Meetings: Never    Marital Status: Married  Catering manager Violence: Not At Risk (09/11/2023)   Humiliation, Afraid, Rape, and Kick questionnaire    Fear of Current or Ex-Partner: No    Emotionally Abused: No    Physically Abused: No    Sexually Abused: No    Functional Status Survey:    Family History  Problem Relation Age of Onset   Dementia Mother 41       alive   Fibromyalgia Mother  Allergies Mother    Heart attack Father 102       deceased   Multiple myeloma Father    Heart murmur Sister 75       she had open heart surgery   Other Sister 94       She is bIpolar- diabetic   Breast cancer Sister    Other Brother        alive   Heart disease Brother        emergent CABG for 99% blocked CAD    Health Maintenance  Topic Date Due   Hepatitis C Screening  Never done   COVID-19 Vaccine (5 - 2024-25 season) 02/05/2023   INFLUENZA VACCINE  01/05/2024   Medicare Annual Wellness (AWV)  03/29/2024   MAMMOGRAM  06/13/2025   Colonoscopy  03/22/2026   DTaP/Tdap/Td (4 - Td or Tdap) 06/24/2033   Pneumonia Vaccine 5+ Years old  Completed   DEXA SCAN  Completed   Zoster Vaccines- Shingrix  Completed   HPV VACCINES  Aged Out   Meningococcal B Vaccine  Aged Out    Allergies  Allergen Reactions   Delsym [Dextromethorphan] Other (See Comments)    Cognitive impairment    Outpatient Encounter Medications as of 10/02/2023  Medication Sig   acyclovir cream (ZOVIRAX) 5 % Apply 1 Application topically 2 (two) times daily as needed (cold sore).   albuterol  (PROAIR  HFA) 108 (90 Base) MCG/ACT inhaler 2 puffs every 4 hours as needed only  if your can't catch your breath (Patient taking differently: Inhale 2 puffs into the lungs  every 4 (four) hours as needed for shortness of breath or wheezing. 2 puffs every 4 hours as needed only  if your can't catch your breath)   apixaban  (ELIQUIS ) 5 MG TABS tablet Take 1 tablet (5 mg total) by mouth 2 (two) times daily.   benzonatate  (TESSALON ) 200 MG capsule Take 1 capsule (200 mg total) by mouth 3 (three) times daily as needed for cough. TAKE 1 CAPSULE(200 MG) BY MOUTH THREE TIMES DAILY AS NEEDED FOR COUGH   budesonide -formoterol  (SYMBICORT ) 160-4.5 MCG/ACT inhaler Inhale 2 puffs into the lungs in the morning and at bedtime.   buPROPion  (WELLBUTRIN  XL) 300 MG 24 hr tablet Take 300 mg by mouth daily.   calcium  carbonate (OSCAL) 1500 (600 Ca) MG TABS tablet Take 600 mg of elemental calcium  by mouth at bedtime.   denosumab  (PROLIA ) 60 MG/ML SOLN injection Inject 60 mg into the skin every 6 (six) months.    donepezil  (ARICEPT ) 10 MG tablet Take 1 tablet (10 mg total) by mouth at bedtime.   FLUoxetine  (PROZAC ) 20 MG capsule Take 20 mg by mouth daily.   ipratropium-albuterol  (DUONEB) 0.5-2.5 (3) MG/3ML SOLN Take 3 mLs by nebulization every 6 (six) hours as needed (Shortness of breath, cough and/or wheeze). Use this or albuterol  inhaler but not both.   MAGNESIUM  GLYCINATE PO Take 1 tablet by mouth at bedtime.   melatonin 3 MG TABS tablet Take 3 mg by mouth at bedtime.   metoprolol tartrate (LOPRESSOR) 25 MG tablet Take 1 tablet (25 mg total) by mouth 2 (two) times daily.   midodrine  (PROAMATINE ) 5 MG tablet Take 1 tablet (5 mg total) by mouth 3 (three) times daily with meals.   montelukast  (SINGULAIR ) 10 MG tablet TAKE 1 TABLET(10 MG) BY MOUTH DAILY (Patient taking differently: Take 10 mg by mouth at bedtime.)   Multiple Vitamins-Minerals (CENTRUM SILVER WOMEN 50+) TABS Take 1 tablet by mouth daily.  ondansetron  (ZOFRAN -ODT) 4 MG disintegrating tablet Take 1 tablet (4 mg total) by mouth every 8 (eight) hours as needed for vomiting or nausea.   pantoprazole  (PROTONIX ) 40 MG tablet TAKE 30  TO 60 MINUTES BEFORE THE FIRST AND LAST MEAL OF THE DAY (Patient taking differently: Take 40 mg by mouth in the morning and at bedtime.)   Potassium 99 MG TABS Take 99 mg by mouth at bedtime.   predniSONE  (DELTASONE ) 5 MG tablet Take  6 Pills PO for 4 days, 4 Pills PO for 4 days, 2 Pills PO for 4 days, 1 Pills PO for 4 days, 1/2 Pill  PO for 4 days then STOP.   rosuvastatin  (CRESTOR ) 10 MG tablet Take 1 tablet (10 mg total) by mouth every evening. (Patient taking differently: Take 10 mg by mouth at bedtime.)   TURMERIC CURCUMIN PO Take 1 tablet by mouth daily.   valACYclovir (VALTREX) 1000 MG tablet Take 1,000 mg by mouth See admin instructions. Take 1 tablet (1000mg ) twice daily as needed for cold sores - continue until cold sores resolve.   No facility-administered encounter medications on file as of 10/02/2023.    Review of Systems  There were no vitals filed for this visit. There is no height or weight on file to calculate BMI. BP Readings from Last 3 Encounters:  09/30/23 118/69  09/06/23 135/71  08/24/23 108/62   Wt Readings from Last 3 Encounters:  09/30/23 149 lb 11.1 oz (67.9 kg)  08/30/23 165 lb 2 oz (74.9 kg)  08/24/23 165 lb 9.6 oz (75.1 kg)   Physical Exam  Labs reviewed: Basic Metabolic Panel: Recent Labs    09/03/23 0242 09/04/23 0357 09/05/23 0330 09/06/23 0404 09/11/23 0541 09/23/23 0438 09/25/23 0436 09/28/23 0450  NA  --  138 140 139   < > 136 137 135  K  --  3.2* 3.2* 3.7   < > 4.0 4.2 3.9  CL  --  101 104 105   < > 96* 99 96*  CO2  --  26 27 22    < > 29 32 28  GLUCOSE  --  150* 117* 95   < > 84 81 92  BUN  --  12 15 16    < > 16 14 20   CREATININE  --  0.68 0.66 0.73   < > 0.57 0.62 0.69  CALCIUM   --  7.6* 7.8* 7.9*   < > 8.5* 8.9 8.9  MG 1.8 2.0 2.2 1.8  --   --  2.0 1.9  PHOS 2.7 2.9 2.3*  --   --   --   --   --    < > = values in this interval not displayed.   Liver Function Tests: Recent Labs    09/03/23 0200 09/05/23 0330 09/06/23 0404  09/11/23 0541  AST 53*  --  42* 29  ALT 46*  --  63* 35  ALKPHOS 64  --  57 71  BILITOT 1.2  --  0.7 0.9  PROT 5.9*  --  5.4* 6.0*  ALBUMIN  2.5* 2.1* 2.2* 2.4*   Recent Labs    04/01/23 1847  LIPASE 14   No results for input(s): "AMMONIA" in the last 8760 hours. CBC: Recent Labs    09/11/23 0541 09/11/23 0550 09/23/23 0438 09/25/23 0436 09/28/23 0450  WBC 18.2*   < > 12.9* 13.2* 16.1*  NEUTROABS 16.0*  --   --  9.3* 12.6*  HGB 10.7*   < > 9.6* 10.1*  11.0*  HCT 34.5*   < > 29.7* 31.4* 33.4*  MCV 100.3*   < > 96.4 96.3 95.4  PLT 297   < > 310 314 322   < > = values in this interval not displayed.   Cardiac Enzymes: No results for input(s): "CKTOTAL", "CKMB", "CKMBINDEX", "TROPONINI" in the last 8760 hours. BNP: Invalid input(s): "POCBNP" No results found for: "HGBA1C" Lab Results  Component Value Date   TSH 1.153 08/30/2023   Lab Results  Component Value Date   VITAMINB12 1,285 (H) 09/12/2023   Lab Results  Component Value Date   FOLATE 34.6 09/12/2023   Lab Results  Component Value Date   IRON 42 09/15/2023   TIBC 190 (L) 09/15/2023   FERRITIN 117 09/15/2023    Imaging and Procedures obtained prior to SNF admission: VAS US  LOWER EXTREMITY VENOUS (DVT) Result Date: 09/12/2023  Lower Venous DVT Study Patient Name:  Lateefah CHANELL HEIST  Date of Exam:   09/12/2023 Medical Rec #: [id number]          Accession #:    [id number] Date of Birth: Jul 02, 1950          Patient Gender: F Patient Age:   11 years Exam Location:  [facility] Procedure:      VAS US  LOWER EXTREMITY VENOUS (DVT) Referring Phys: PAULA SIMPSON --------------------------------------------------------------------------------  Indications: SOB, and pulmonary embolism.  Comparison Study: Previous study on 10.4.2024. Performing Technologist: Ria Chad  Examination Guidelines: A complete evaluation includes B-mode imaging, spectral Doppler, color Doppler, and power Doppler as needed of all  accessible portions of each vessel. Bilateral testing is considered an integral part of a complete examination. Limited examinations for reoccurring indications may be performed as noted. The reflux portion of the exam is performed with the patient in reverse Trendelenburg.  +---------+---------------+---------+-----------+----------+--------------+ RIGHT    CompressibilityPhasicitySpontaneityPropertiesThrombus Aging +---------+---------------+---------+-----------+----------+--------------+ CFV      Full           Yes      Yes                                 +---------+---------------+---------+-----------+----------+--------------+ SFJ      Full           Yes      Yes                                 +---------+---------------+---------+-----------+----------+--------------+ FV Prox  Full                                                        +---------+---------------+---------+-----------+----------+--------------+ FV Mid   Full                                                        +---------+---------------+---------+-----------+----------+--------------+ FV DistalFull                                                        +---------+---------------+---------+-----------+----------+--------------+  PFV      Full                                                        +---------+---------------+---------+-----------+----------+--------------+ POP      Full           Yes      Yes                                 +---------+---------------+---------+-----------+----------+--------------+ Right BKA  +---------+---------------+---------+-----------+----------+--------------+ LEFT     CompressibilityPhasicitySpontaneityPropertiesThrombus Aging +---------+---------------+---------+-----------+----------+--------------+ CFV      Full           Yes      Yes                                  +---------+---------------+---------+-----------+----------+--------------+ SFJ      Full           Yes      Yes                                 +---------+---------------+---------+-----------+----------+--------------+ FV Prox  Full                                                        +---------+---------------+---------+-----------+----------+--------------+ FV Mid   Full                                                        +---------+---------------+---------+-----------+----------+--------------+ FV DistalFull                                                        +---------+---------------+---------+-----------+----------+--------------+ PFV      Full                                                        +---------+---------------+---------+-----------+----------+--------------+ POP      Full           Yes      Yes                                 +---------+---------------+---------+-----------+----------+--------------+ PTV      Full                                                        +---------+---------------+---------+-----------+----------+--------------+  PERO     None           No       No                                  +---------+---------------+---------+-----------+----------+--------------+ Only one of the paired peroneal veins is thrombosed.    Summary: RIGHT: - There is no evidence of deep vein thrombosis in the lower extremity.  - No cystic structure found in the popliteal fossa.  LEFT: - Findings consistent with age indeterminate deep vein thrombosis involving the left peroneal veins.  - No cystic structure found in the popliteal fossa.  *See table(s) above for measurements and observations. Electronically signed by Angela Kell MD on 09/12/2023 at 7:46:34 PM.    Final    ECHOCARDIOGRAM LIMITED Result Date: 09/12/2023    ECHOCARDIOGRAM LIMITED REPORT   Patient Name:   Lashawnta Lindsay Rho Date of Exam: 09/12/2023 Medical Rec #:   [id number]         Height:       65.0 in Accession #:    [id number]        Weight:       148.4 lb Date of Birth:  1951-01-08         BSA:          1.742 m Patient Age:    72 years          BP:           91/42 mmHg Patient Gender: F                 HR:           90 bpm. Exam Location:  Inpatient Procedure: Limited Echo (Both Spectral and Color Flow Doppler were utilized            during procedure). Indications:    Pulmonary Embolism  History:        Patient has prior history of Echocardiogram examinations, most                 recent 08/31/2023. Risk Factors:Hypertension.  Sonographer:    Astrid Blamer Referring Phys: [id number] RONDELL A SMITH IMPRESSIONS  1. Left ventricular ejection fraction, by estimation, is 65 to 70%. The left ventricle has normal function.  2. Basal function is preserved. Right ventricular systolic function was not well visualized. The right ventricular size is not well visualized.  3. The mitral valve is grossly normal.  4. Limited study. Comparison(s): No significant change from prior study. Prior images reviewed side by side. FINDINGS  Left Ventricle: Left ventricular ejection fraction, by estimation, is 65 to 70%. The left ventricle has normal function. The left ventricular internal cavity size was normal in size. There is no left ventricular hypertrophy. Right Ventricle: Basal function is preserved. The right ventricular size is not well visualized. Right ventricular systolic function was not well visualized. Left Atrium: Left atrial size was normal in size. Right Atrium: Right atrial size was normal in size. Pericardium: There is no evidence of pericardial effusion. Mitral Valve: The mitral valve is grossly normal. Aorta: The aortic root and ascending aorta are structurally normal, with no evidence of dilitation. LEFT VENTRICLE PLAX 2D LVIDd:         3.80 cm LVIDs:         2.00 cm LV PW:         0.70 cm LV IVS:  0.90 cm LVOT diam:     1.70 cm LVOT Area:     2.27 cm  LEFT ATRIUM              Index LA Vol (A2C):   33.6 ml 19.28 ml/m LA Vol (A4C):   33.5 ml 19.23 ml/m LA Biplane Vol: 36.7 ml 21.06 ml/m   AORTA Ao Root diam: 2.80 cm  SHUNTS Systemic Diam: 1.70 cm Gloriann Larger MD Electronically signed by Gloriann Larger MD Signature Date/Time: 09/12/2023/10:19:07 AM    Final    DG Chest Port 1 View Result Date: 09/12/2023 CLINICAL DATA:  Respiratory failure. EXAM: PORTABLE CHEST 1 VIEW COMPARISON:  September 11, 2023. FINDINGS: Stable cardiomediastinal silhouette. Stable bilateral lung opacities are noted concerning for pneumonia. Bony thorax is unremarkable. Elevated left hemidiaphragm. IMPRESSION: Stable bilateral lung opacities are noted concerning for multifocal pneumonia. Electronically Signed   By: Rosalene Colon M.D.   On: 09/12/2023 09:50   CT Angio Chest Pulmonary Embolism (PE) W or WO Contrast Result Date: 09/11/2023 CLINICAL DATA:  Shortness of breath. Questionable sepsis. Positive D-dimer EXAM: CT ANGIOGRAPHY CHEST WITH CONTRAST TECHNIQUE: Multidetector CT imaging of the chest was performed using the standard protocol during bolus administration of intravenous contrast. Multiplanar CT image reconstructions and MIPs were obtained to evaluate the vascular anatomy. RADIATION DOSE REDUCTION: This exam was performed according to the departmental dose-optimization program which includes automated exposure control, adjustment of the mA and/or kV according to patient size and/or use of iterative reconstruction technique. CONTRAST:  75mL OMNIPAQUE  IOHEXOL  350 MG/ML SOLN COMPARISON:  X-ray earlier 09/11/2023.  CTA chest 01/11/2019. FINDINGS: Cardiovascular: Trace pericardial fluid. Heart nonenlarged. The thoracic aorta is normal course and caliber with calcified scattered atherosclerotic plaque. There is plaque extending along the great vessels as well. Some enlargement of the pulmonary artery. Please correlate for pulmonary artery hypertension. There is some pulmonary emboli identified.  These include areas along the medial aspect of the lower middle lobe, anteromedial left upper lobe. Few smaller areas as well. Overall mild clot burden. Mediastinum/Nodes: Preserved thyroid  gland. Patulous esophagus. There are some small less than 1 cm in size mediastinal nodes identified, nonpathologic by size criteria. There are some calcified nodes as well including in the subcarinal region of the mediastinum and left hilum consistent with old granulomatous disease. There also several mildly enlarged mediastinal nodes. Example towards the AP window measuring 16 by 10 mm. Paratracheal nodes are seen, subcarinal. No specific abnormal lymph node enlargement in the axillary regions or hila. Lungs/Pleura: Extensive patchy areas of ground-glass with interstitial septal thickening, possible components of crazy paving. Distribution includes diffusely in the left lung with some mild areas of sparing at the left lung apex and superior segment of the left lower lobe medially. Less areas in the right lung with some specific areas such as right lung apex as well as more confluent areas in the right lower lobe with patchy areas in the middle lobe. These are new from the remote CT scan of the chest compared to the abdomen pelvis CT of 08/30/2023 the lung bases the opacities have changed. Less confluent at the right lower lobe with new areas elsewhere in the visualized thorax. No frank consolidation. No pneumothorax or effusion. Upper Abdomen: Adrenal glands are preserved in the upper abdomen. Hepatic and splenic granulomas are identified. Musculoskeletal: Mild curvature of the spine. Moderate degenerative changes scattered along the spine. There is neurostimulator leads extending along the central canal of the spine. Battery pack is not included  in the imaging field nor is the entirety of the soft tissue course of the leads. Review of the MIP images confirms the above findings. IMPRESSION: Few scattered segmental pulmonary  emboli such as medial left upper lobe and middle lobe. Mild clot burden. Changing parenchymal lung opacities with areas of interstitial septal thickening ground-glass today. Almost crazy paving in appearance. More left lung than right. In addition there are several enlarged mediastinal nodes. These could be reactive with the lung findings. Please correlate with clinical presentation and follow-up. Some enlargement of the main pulmonary arteries. Please correlate for pulmonary artery hypertension. Evidence of old granulomatous disease. Critical Value/emergent results were called by telephone at the time of interpretation on 09/11/2023 at 1:44 pm to provider PA Randsom , who verbally acknowledged these results. Aortic Atherosclerosis (ICD10-I70.0). Electronically Signed   By: Adrianna Horde M.D.   On: 09/11/2023 13:48   DG Chest Port 1 View Result Date: 09/11/2023 CLINICAL DATA:  Evaluate shortness of breath.  Questionable sepsis. EXAM: PORTABLE CHEST 1 VIEW COMPARISON:  09/04/2023 FINDINGS: Stable cardiomediastinal contours. Progressive bilateral airspace opacities. There is been increase in left mid and left lower lung airspace disease with persistent opacification of the right lower lung. No pneumothorax. No significant pleural effusion. Thoracic scoliosis. Dorsal column stimulator noted with lead terminating in the midthoracic spine. IMPRESSION: Progressive bilateral airspace opacities compatible with multifocal pneumonia. Electronically Signed   By: Kimberley Penman M.D.   On: 09/11/2023 05:55    Assessment and Plan Assessment & Plan  Hospital discharge follow-up Hypoxic respiratory failure Coarse breath sounds lower lung fields Patient able to speak in full sentences, does not appear to be in distress Continue with supplemental oxygen  Continue with the DuoNebs   Continue with PT, OT Will check CBC, BMP in 1 week Chest x-ray in 2 weeks  History of PE Continue with Eliquis  No signs of  bleeding Pulmonary follow-up  GERD  Will increase protonix  to 40 mg bid Cont with zofran   Encouraged patient to eat small frequent meals Avoid laying on bed immediately after eating meals   Depression Continue with Wellbutrin , Prozac   History of osteoporosis  continue with outpatient Prolia  injection  Hypertension Continue with metoprolol 25 mg twice daily Patient was started on midodrine  3 times daily Will monitor blood pressure and consider weaning   Seasonal allergies Continue with Singulair .  40 min Total time spent for obtaining history,  performing a medically appropriate examination and evaluation, reviewing the tests,ordering  tests,  documenting clinical information in the electronic or other health record,  ,care coordination (not separately reported)

## 2023-10-04 ENCOUNTER — Encounter: Admitting: Physical Therapy

## 2023-10-06 ENCOUNTER — Non-Acute Institutional Stay (SKILLED_NURSING_FACILITY): Payer: Self-pay | Admitting: Sports Medicine

## 2023-10-06 DIAGNOSIS — R918 Other nonspecific abnormal finding of lung field: Secondary | ICD-10-CM | POA: Diagnosis not present

## 2023-10-06 DIAGNOSIS — J9601 Acute respiratory failure with hypoxia: Secondary | ICD-10-CM

## 2023-10-06 DIAGNOSIS — J849 Interstitial pulmonary disease, unspecified: Secondary | ICD-10-CM | POA: Diagnosis not present

## 2023-10-06 DIAGNOSIS — K5900 Constipation, unspecified: Secondary | ICD-10-CM | POA: Diagnosis not present

## 2023-10-06 DIAGNOSIS — I10 Essential (primary) hypertension: Secondary | ICD-10-CM

## 2023-10-06 DIAGNOSIS — I2699 Other pulmonary embolism without acute cor pulmonale: Secondary | ICD-10-CM | POA: Diagnosis not present

## 2023-10-06 NOTE — Progress Notes (Unsigned)
 Provider:  Dr. Tye Gall Location:  [facility] Place of Service:   Skilled care    PCP: Faustina Hood, MD Patient Care Team: Faustina Hood, MD as PCP - General (Family Medicine) Wenona Hamilton, MD as PCP - Cardiology (Cardiology) Adelaide Adjutant, MD as Consulting Physician (Physical Medicine and Rehabilitation)  Extended Emergency Contact Information Primary Emergency Contact: May,Brian Address: [address]          [place], [place] [address] [place] Home Phone: [phone] Work Phone: [phone] Mobile Phone: [phone] Relation: Brother Secondary Emergency Contact: graham,marisa Mobile Phone: [phone] Relation: Daughter  Goals of Care: Advanced Directive information    09/11/2023   10:50 AM  Advanced Directives  Does Patient Have a Medical Advance Directive? Yes  Type of Advance Directive Healthcare Power of Attorney  Does patient want to make changes to medical advance directive? Yes (Inpatient - patient defers changing a medical advance directive and declines information at this time)  Copy of Healthcare Power of Attorney in Chart? No - copy requested       History of Present Illness  73 year old female with a past medical history of COPD, PE, hypoxic respiratory failure, ARDS, constipation is evaluated for low blood pressure.  Pt seen and examined in her room  Brother at bedside Pt is able to speak in full sentences Does not appear to be in distress C/o exertional SOB, she is ambulate with her walker from there room to half way in the hall way. Able to lay flat. Currently on supplemental o2. States she has cough but is improving  Chest x ray showed basal opacities   Past Medical History:  Diagnosis Date   Anxiety    Arthritis    Asthma    Avascular necrosis of talus (HCC)    Cancer (HCC)    vulva pre cancer had surgery   Depression    Gait abnormality 03/20/2017   GERD (gastroesophageal reflux disease)     Hearing loss    "very minor"   History of pneumonia    Hyperlipidemia    Hypertension    Leg edema    left   Memory difficulty 03/20/2017   PONV (postoperative nausea and vomiting)    Stress incontinence    Past Surgical History:  Procedure Laterality Date   ANKLE FUSION  2011   right multiple    ANKLE FUSION     rear ankle fusion   ANKLE SURGERY  2010   right cordicompression   AORTIC ARCH ANGIOGRAPHY N/A 12/11/2017   Procedure: AORTIC ARCH ANGIOGRAPHY;  Surgeon: Richrd Char, MD;  Location: [facility];  Service: Cardiovascular;  Laterality: N/A;   APPENDECTOMY  05/10/2016   BELOW KNEE LEG AMPUTATION     CATARACT EXTRACTION W/ INTRAOCULAR LENS IMPLANT Left    LAPAROSCOPIC APPENDECTOMY N/A 05/10/2016   Procedure: APPENDECTOMY LAPAROSCOPIC;  Surgeon: Oza Blumenthal, MD;  Location: [facility] OR;  Service: General;  Laterality: N/A;   LUMBAR LAMINECTOMY/DECOMPRESSION MICRODISCECTOMY N/A 09/09/2015   Procedure:  L4-S1 Decompression/ Discetomy;  Surgeon: Mort Ards, MD;  Location: [facility] OR;  Service: Orthopedics;  Laterality: N/A;   SKIN GRAFT     SPINAL CORD STIMULATOR BATTERY EXCHANGE  06/23/2023   SPINAL CORD STIMULATOR INSERTION N/A 01/10/2019   Procedure: LUMBAR SPINAL CORD STIMULATOR INSERTION;  Surgeon: Mort Ards, MD;  Location: [facility] OR;  Service: Orthopedics;  Laterality: N/A;  2.5 hrs   TONSILLECTOMY     TUBAL LIGATION  1983   VULVECTOMY N/A  01/28/2015   Procedure: WIDE EXCISION VULVECTOMY;  Surgeon: Johnn Najjar, MD;  Location: [facility];  Service: Gynecology;  Laterality: N/A;    reports that she quit smoking about 16 years ago. Her smoking use included cigarettes. She started smoking about 56 years ago. She has a 40 pack-year smoking history. She has never used smokeless tobacco. She reports current alcohol  use. She reports that she does not use drugs. Social History   Socioeconomic History   Marital status: Divorced    Spouse name: Not on file   Number  of children: 3   Years of education: 14   Highest education level: Not on file  Occupational History   Occupation: ACCOUNTING    Employer: [employer]  Tobacco Use   Smoking status: Former    Current packs/day: 0.00    Average packs/day: 1 pack/day for 40.0 years (40.0 ttl pk-yrs)    Types: Cigarettes    Start date: 02/05/1967    Quit date: 02/05/2007    Years since quitting: 16.6   Smokeless tobacco: Never  Vaping Use   Vaping status: Never Used  Substance and Sexual Activity   Alcohol  use: Yes    Alcohol /week: 0.0 standard drinks of alcohol     Comment: socially   Drug use: No   Sexual activity: Not on file  Other Topics Concern   Not on file  Social History Narrative   Lives alone   Caffeine use: Tea every morning    Right handed    Social Drivers of Health   Financial Resource Strain: Not on file  Food Insecurity: No Food Insecurity (09/11/2023)   Hunger Vital Sign    Worried About Running Out of Food in the Last Year: Never true    Ran Out of Food in the Last Year: Never true  Transportation Needs: No Transportation Needs (09/11/2023)   PRAPARE - Administrator, Civil Service (Medical): No    Lack of Transportation (Non-Medical): No  Physical Activity: Not on file  Stress: Not on file  Social Connections: Moderately Isolated (09/11/2023)   Social Connection and Isolation Panel [NHANES]    Frequency of Communication with Friends and Family: More than three times a week    Frequency of Social Gatherings with Friends and Family: Once a week    Attends Religious Services: Never    Database administrator or Organizations: No    Attends Banker Meetings: Never    Marital Status: Married  Catering manager Violence: Not At Risk (09/11/2023)   Humiliation, Afraid, Rape, and Kick questionnaire    Fear of Current or Ex-Partner: No    Emotionally Abused: No    Physically Abused: No    Sexually Abused: No    Functional Status Survey:    Family  History  Problem Relation Age of Onset   Dementia Mother 33       alive   Fibromyalgia Mother    Allergies Mother    Heart attack Father 29       deceased   Multiple myeloma Father    Heart murmur Sister 85       she had open heart surgery   Other Sister 66       She is bIpolar- diabetic   Breast cancer Sister    Other Brother        alive   Heart disease Brother        emergent CABG for 99% blocked CAD    Health Maintenance  Topic Date Due   Hepatitis C Screening  Never done   COVID-19 Vaccine (5 - 2024-25 season) 02/05/2023   INFLUENZA VACCINE  01/05/2024   Medicare Annual Wellness (AWV)  03/29/2024   MAMMOGRAM  06/13/2025   Colonoscopy  03/22/2026   DTaP/Tdap/Td (4 - Td or Tdap) 06/24/2033   Pneumonia Vaccine 70+ Years old  Completed   DEXA SCAN  Completed   Zoster Vaccines- Shingrix  Completed   HPV VACCINES  Aged Out   Meningococcal B Vaccine  Aged Out    Allergies  Allergen Reactions   Delsym [Dextromethorphan] Other (See Comments)    Cognitive impairment    Outpatient Encounter Medications as of 10/06/2023  Medication Sig   acyclovir cream (ZOVIRAX) 5 % Apply 1 Application topically 2 (two) times daily as needed (cold sore).   albuterol  (PROAIR  HFA) 108 (90 Base) MCG/ACT inhaler 2 puffs every 4 hours as needed only  if your can't catch your breath (Patient taking differently: Inhale 2 puffs into the lungs every 4 (four) hours as needed for shortness of breath or wheezing. 2 puffs every 4 hours as needed only  if your can't catch your breath)   apixaban  (ELIQUIS ) 5 MG TABS tablet Take 1 tablet (5 mg total) by mouth 2 (two) times daily.   benzonatate  (TESSALON ) 200 MG capsule Take 1 capsule (200 mg total) by mouth 3 (three) times daily as needed for cough. TAKE 1 CAPSULE(200 MG) BY MOUTH THREE TIMES DAILY AS NEEDED FOR COUGH   budesonide -formoterol  (SYMBICORT ) 160-4.5 MCG/ACT inhaler Inhale 2 puffs into the lungs in the morning and at bedtime.   buPROPion   (WELLBUTRIN  XL) 300 MG 24 hr tablet Take 300 mg by mouth daily.   calcium  carbonate (OSCAL) 1500 (600 Ca) MG TABS tablet Take 600 mg of elemental calcium  by mouth at bedtime.   denosumab  (PROLIA ) 60 MG/ML SOLN injection Inject 60 mg into the skin every 6 (six) months.    donepezil  (ARICEPT ) 10 MG tablet Take 1 tablet (10 mg total) by mouth at bedtime.   FLUoxetine  (PROZAC ) 20 MG capsule Take 20 mg by mouth daily.   ipratropium-albuterol  (DUONEB) 0.5-2.5 (3) MG/3ML SOLN Take 3 mLs by nebulization every 6 (six) hours as needed (Shortness of breath, cough and/or wheeze). Use this or albuterol  inhaler but not both.   MAGNESIUM  GLYCINATE PO Take 1 tablet by mouth at bedtime.   melatonin 3 MG TABS tablet Take 3 mg by mouth at bedtime.   metoprolol  tartrate (LOPRESSOR ) 25 MG tablet Take 1 tablet (25 mg total) by mouth 2 (two) times daily.   midodrine  (PROAMATINE ) 5 MG tablet Take 1 tablet (5 mg total) by mouth 3 (three) times daily with meals.   montelukast  (SINGULAIR ) 10 MG tablet TAKE 1 TABLET(10 MG) BY MOUTH DAILY (Patient taking differently: Take 10 mg by mouth at bedtime.)   Multiple Vitamins-Minerals (CENTRUM SILVER WOMEN 50+) TABS Take 1 tablet by mouth daily.   ondansetron  (ZOFRAN -ODT) 4 MG disintegrating tablet Take 1 tablet (4 mg total) by mouth every 8 (eight) hours as needed for vomiting or nausea.   pantoprazole  (PROTONIX ) 40 MG tablet TAKE 30 TO 60 MINUTES BEFORE THE FIRST AND LAST MEAL OF THE DAY (Patient taking differently: Take 40 mg by mouth in the morning and at bedtime.)   Potassium 99 MG TABS Take 99 mg by mouth at bedtime.   predniSONE  (DELTASONE ) 5 MG tablet Take  6 Pills PO for 4 days, 4 Pills PO for 4 days, 2 Pills PO  for 4 days, 1 Pills PO for 4 days, 1/2 Pill  PO for 4 days then STOP.   rosuvastatin  (CRESTOR ) 10 MG tablet Take 1 tablet (10 mg total) by mouth every evening. (Patient taking differently: Take 10 mg by mouth at bedtime.)   TURMERIC CURCUMIN PO Take 1 tablet by mouth  daily.   valACYclovir (VALTREX) 1000 MG tablet Take 1,000 mg by mouth See admin instructions. Take 1 tablet (1000mg ) twice daily as needed for cold sores - continue until cold sores resolve.   No facility-administered encounter medications on file as of 10/06/2023.    Review of Systems  Constitutional:  Negative for fever.  Respiratory:  Positive for shortness of breath (exertional SOB). Negative for cough and wheezing.   Cardiovascular:  Negative for chest pain.  Gastrointestinal:  Negative for abdominal distention, abdominal pain, blood in stool, constipation, diarrhea, nausea and vomiting.  Genitourinary:  Negative for dysuria and frequency.  Neurological:  Negative for dizziness.   Negative unless indicated in HPI.  There were no vitals filed for this visit. There is no height or weight on file to calculate BMI. BP Readings from Last 3 Encounters:  10/02/23 (!) 104/55  09/30/23 118/69  09/06/23 135/71   Wt Readings from Last 3 Encounters:  10/02/23 147 lb (66.7 kg)  09/30/23 149 lb 11.1 oz (67.9 kg)  08/30/23 165 lb 2 oz (74.9 kg)   Physical Exam Constitutional:      Appearance: Normal appearance.  HENT:     Head: Normocephalic and atraumatic.  Cardiovascular:     Rate and Rhythm: Normal rate and regular rhythm.  Pulmonary:     Effort: Pulmonary effort is normal. No respiratory distress.     Breath sounds: Normal breath sounds. No wheezing.  Abdominal:     General: Bowel sounds are normal. There is no distension.     Tenderness: There is no abdominal tenderness. There is no guarding.     Comments:    Musculoskeletal:        General: No swelling.  Neurological:     Mental Status: She is alert. Mental status is at baseline.     Labs reviewed: Basic Metabolic Panel: Recent Labs    09/03/23 0242 09/04/23 0357 09/05/23 0330 09/06/23 0404 09/11/23 0541 09/23/23 0438 09/25/23 0436 09/28/23 0450  NA  --  138 140 139   < > 136 137 135  K  --  3.2* 3.2* 3.7   < >  4.0 4.2 3.9  CL  --  101 104 105   < > 96* 99 96*  CO2  --  26 27 22    < > 29 32 28  GLUCOSE  --  150* 117* 95   < > 84 81 92  BUN  --  12 15 16    < > 16 14 20   CREATININE  --  0.68 0.66 0.73   < > 0.57 0.62 0.69  CALCIUM   --  7.6* 7.8* 7.9*   < > 8.5* 8.9 8.9  MG 1.8 2.0 2.2 1.8  --   --  2.0 1.9  PHOS 2.7 2.9 2.3*  --   --   --   --   --    < > = values in this interval not displayed.   Liver Function Tests: Recent Labs    09/03/23 0200 09/05/23 0330 09/06/23 0404 09/11/23 0541  AST 53*  --  42* 29  ALT 46*  --  63* 35  ALKPHOS 64  --  57 71  BILITOT 1.2  --  0.7 0.9  PROT 5.9*  --  5.4* 6.0*  ALBUMIN  2.5* 2.1* 2.2* 2.4*   Recent Labs    04/01/23 1847  LIPASE 14   No results for input(s): "AMMONIA" in the last 8760 hours. CBC: Recent Labs    09/11/23 0541 09/11/23 0550 09/23/23 0438 09/25/23 0436 09/28/23 0450  WBC 18.2*   < > 12.9* 13.2* 16.1*  NEUTROABS 16.0*  --   --  9.3* 12.6*  HGB 10.7*   < > 9.6* 10.1* 11.0*  HCT 34.5*   < > 29.7* 31.4* 33.4*  MCV 100.3*   < > 96.4 96.3 95.4  PLT 297   < > 310 314 322   < > = values in this interval not displayed.   Cardiac Enzymes: No results for input(s): "CKTOTAL", "CKMB", "CKMBINDEX", "TROPONINI" in the last 8760 hours. BNP: Invalid input(s): "POCBNP" No results found for: "HGBA1C" Lab Results  Component Value Date   TSH 1.153 08/30/2023   Lab Results  Component Value Date   VITAMINB12 1,285 (H) 09/12/2023   Lab Results  Component Value Date   FOLATE 34.6 09/12/2023   Lab Results  Component Value Date   IRON 42 09/15/2023   TIBC 190 (L) 09/15/2023   FERRITIN 117 09/15/2023    Imaging and Procedures obtained prior to SNF admission: VAS US  LOWER EXTREMITY VENOUS (DVT) Result Date: 09/12/2023  Lower Venous DVT Study Patient Name:  Rosalynn ALEYSSA PALOMAR  Date of Exam:   09/12/2023 Medical Rec #: [id number]          Accession #:    [id number] Date of Birth: 1950-09-13          Patient Gender: F Patient Age:   73  years Exam [facility]:  [facility] Procedure:      VAS US  LOWER EXTREMITY VENOUS (DVT) Referring Phys: PAULA SIMPSON --------------------------------------------------------------------------------  Indications: SOB, and pulmonary embolism.  Comparison Study: Previous study on 10.4.2024. Performing Technologist: Ria Chad  Examination Guidelines: A complete evaluation includes B-mode imaging, spectral Doppler, color Doppler, and power Doppler as needed of all accessible portions of each vessel. Bilateral testing is considered an integral part of a complete examination. Limited examinations for reoccurring indications may be performed as noted. The reflux portion of the exam is performed with the patient in reverse Trendelenburg.  +---------+---------------+---------+-----------+----------+--------------+ RIGHT    CompressibilityPhasicitySpontaneityPropertiesThrombus Aging +---------+---------------+---------+-----------+----------+--------------+ CFV      Full           Yes      Yes                                 +---------+---------------+---------+-----------+----------+--------------+ SFJ      Full           Yes      Yes                                 +---------+---------------+---------+-----------+----------+--------------+ FV Prox  Full                                                        +---------+---------------+---------+-----------+----------+--------------+ FV Mid   Full                                                        +---------+---------------+---------+-----------+----------+--------------+  FV DistalFull                                                        +---------+---------------+---------+-----------+----------+--------------+ PFV      Full                                                        +---------+---------------+---------+-----------+----------+--------------+ POP      Full           Yes      Yes                                  +---------+---------------+---------+-----------+----------+--------------+ Right BKA  +---------+---------------+---------+-----------+----------+--------------+ LEFT     CompressibilityPhasicitySpontaneityPropertiesThrombus Aging +---------+---------------+---------+-----------+----------+--------------+ CFV      Full           Yes      Yes                                 +---------+---------------+---------+-----------+----------+--------------+ SFJ      Full           Yes      Yes                                 +---------+---------------+---------+-----------+----------+--------------+ FV Prox  Full                                                        +---------+---------------+---------+-----------+----------+--------------+ FV Mid   Full                                                        +---------+---------------+---------+-----------+----------+--------------+ FV DistalFull                                                        +---------+---------------+---------+-----------+----------+--------------+ PFV      Full                                                        +---------+---------------+---------+-----------+----------+--------------+ POP      Full           Yes      Yes                                 +---------+---------------+---------+-----------+----------+--------------+  PTV      Full                                                        +---------+---------------+---------+-----------+----------+--------------+ PERO     None           No       No                                  +---------+---------------+---------+-----------+----------+--------------+ Only one of the paired peroneal veins is thrombosed.    Summary: RIGHT: - There is no evidence of deep vein thrombosis in the lower extremity.  - No cystic structure found in the popliteal fossa.  LEFT: - Findings consistent with age  indeterminate deep vein thrombosis involving the left peroneal veins.  - No cystic structure found in the popliteal fossa.  *See table(s) above for measurements and observations. Electronically signed by Angela Kell MD on 09/12/2023 at 7:46:34 PM.    Final    ECHOCARDIOGRAM LIMITED Result Date: 09/12/2023    ECHOCARDIOGRAM LIMITED REPORT   Patient Name:   Shelly [name] Date of Exam: 09/12/2023 Medical Rec #:  [id number]         Height:       65.0 in Accession #:    [id number]        Weight:       148.4 lb Date of Birth:  Apr 26, 1951         BSA:          1.742 m Patient Age:    72 years          BP:           91/42 mmHg Patient Gender: F                 HR:           90 bpm. Exam Location:  Inpatient Procedure: Limited Echo (Both Spectral and Color Flow Doppler were utilized            during procedure). Indications:    Pulmonary Embolism  History:        Patient has prior history of Echocardiogram examinations, most                 recent 08/31/2023. Risk Factors:Hypertension.  Sonographer:    Astrid Blamer Referring Phys: [id number] RONDELL A SMITH IMPRESSIONS  1. Left ventricular ejection fraction, by estimation, is 65 to 70%. The left ventricle has normal function.  2. Basal function is preserved. Right ventricular systolic function was not well visualized. The right ventricular size is not well visualized.  3. The mitral valve is grossly normal.  4. Limited study. Comparison(s): No significant change from prior study. Prior images reviewed side by side. FINDINGS  Left Ventricle: Left ventricular ejection fraction, by estimation, is 65 to 70%. The left ventricle has normal function. The left ventricular internal cavity size was normal in size. There is no left ventricular hypertrophy. Right Ventricle: Basal function is preserved. The right ventricular size is not well visualized. Right ventricular systolic function was not well visualized. Left Atrium: Left atrial size was normal in size. Right Atrium: Right atrial  size was normal in size. Pericardium: There is no evidence of pericardial effusion.  Mitral Valve: The mitral valve is grossly normal. Aorta: The aortic root and ascending aorta are structurally normal, with no evidence of dilitation. LEFT VENTRICLE PLAX 2D LVIDd:         3.80 cm LVIDs:         2.00 cm LV PW:         0.70 cm LV IVS:        0.90 cm LVOT diam:     1.70 cm LVOT Area:     2.27 cm  LEFT ATRIUM             Index LA Vol (A2C):   33.6 ml 19.28 ml/m LA Vol (A4C):   33.5 ml 19.23 ml/m LA Biplane Vol: 36.7 ml 21.06 ml/m   AORTA Ao Root diam: 2.80 cm  SHUNTS Systemic Diam: 1.70 cm Gloriann Larger MD Electronically signed by Gloriann Larger MD Signature Date/Time: 09/12/2023/10:19:07 AM    Final    DG Chest Port 1 View Result Date: 09/12/2023 CLINICAL DATA:  Respiratory failure. EXAM: PORTABLE CHEST 1 VIEW COMPARISON:  September 11, 2023. FINDINGS: Stable cardiomediastinal silhouette. Stable bilateral lung opacities are noted concerning for pneumonia. Bony thorax is unremarkable. Elevated left hemidiaphragm. IMPRESSION: Stable bilateral lung opacities are noted concerning for multifocal pneumonia. Electronically Signed   By: Rosalene Colon M.D.   On: 09/12/2023 09:50   CT Angio Chest Pulmonary Embolism (PE) W or WO Contrast Result Date: 09/11/2023 CLINICAL DATA:  Shortness of breath. Questionable sepsis. Positive D-dimer EXAM: CT ANGIOGRAPHY CHEST WITH CONTRAST TECHNIQUE: Multidetector CT imaging of the chest was performed using the standard protocol during bolus administration of intravenous contrast. Multiplanar CT image reconstructions and MIPs were obtained to evaluate the vascular anatomy. RADIATION DOSE REDUCTION: This exam was performed according to the departmental dose-optimization program which includes automated exposure control, adjustment of the mA and/or kV according to patient size and/or use of iterative reconstruction technique. CONTRAST:  75mL OMNIPAQUE  IOHEXOL  350 MG/ML SOLN  COMPARISON:  X-ray earlier 09/11/2023.  CTA chest 01/11/2019. FINDINGS: Cardiovascular: Trace pericardial fluid. Heart nonenlarged. The thoracic aorta is normal course and caliber with calcified scattered atherosclerotic plaque. There is plaque extending along the great vessels as well. Some enlargement of the pulmonary artery. Please correlate for pulmonary artery hypertension. There is some pulmonary emboli identified. These include areas along the medial aspect of the lower middle lobe, anteromedial left upper lobe. Few smaller areas as well. Overall mild clot burden. Mediastinum/Nodes: Preserved thyroid  gland. Patulous esophagus. There are some small less than 1 cm in size mediastinal nodes identified, nonpathologic by size criteria. There are some calcified nodes as well including in the subcarinal region of the mediastinum and left hilum consistent with old granulomatous disease. There also several mildly enlarged mediastinal nodes. Example towards the AP window measuring 16 by 10 mm. Paratracheal nodes are seen, subcarinal. No specific abnormal lymph node enlargement in the axillary regions or hila. Lungs/Pleura: Extensive patchy areas of ground-glass with interstitial septal thickening, possible components of crazy paving. Distribution includes diffusely in the left lung with some mild areas of sparing at the left lung apex and superior segment of the left lower lobe medially. Less areas in the right lung with some specific areas such as right lung apex as well as more confluent areas in the right lower lobe with patchy areas in the middle lobe. These are new from the remote CT scan of the chest compared to the abdomen pelvis CT of 08/30/2023 the lung bases the opacities have  changed. Less confluent at the right lower lobe with new areas elsewhere in the visualized thorax. No frank consolidation. No pneumothorax or effusion. Upper Abdomen: Adrenal glands are preserved in the upper abdomen. Hepatic and  splenic granulomas are identified. Musculoskeletal: Mild curvature of the spine. Moderate degenerative changes scattered along the spine. There is neurostimulator leads extending along the central canal of the spine. Battery pack is not included in the imaging field nor is the entirety of the soft tissue course of the leads. Review of the MIP images confirms the above findings. IMPRESSION: Few scattered segmental pulmonary emboli such as medial left upper lobe and middle lobe. Mild clot burden. Changing parenchymal lung opacities with areas of interstitial septal thickening ground-glass today. Almost crazy paving in appearance. More left lung than right. In addition there are several enlarged mediastinal nodes. These could be reactive with the lung findings. Please correlate with clinical presentation and follow-up. Some enlargement of the main pulmonary arteries. Please correlate for pulmonary artery hypertension. Evidence of old granulomatous disease. Critical Value/emergent results were called by telephone at the time of interpretation on 09/11/2023 at 1:44 pm to provider PA Randsom , who verbally acknowledged these results. Aortic Atherosclerosis (ICD10-I70.0). Electronically Signed   By: Adrianna Horde M.D.   On: 09/11/2023 13:48   DG Chest Port 1 View Result Date: 09/11/2023 CLINICAL DATA:  Evaluate shortness of breath.  Questionable sepsis. EXAM: PORTABLE CHEST 1 VIEW COMPARISON:  09/04/2023 FINDINGS: Stable cardiomediastinal contours. Progressive bilateral airspace opacities. There is been increase in left mid and left lower lung airspace disease with persistent opacification of the right lower lung. No pneumothorax. No significant pleural effusion. Thoracic scoliosis. Dorsal column stimulator noted with lead terminating in the midthoracic spine. IMPRESSION: Progressive bilateral airspace opacities compatible with multifocal pneumonia. Electronically Signed   By: Kimberley Penman M.D.   On: 09/11/2023 05:55     Assessment and Plan Assessment & Plan Hypoxic respiratory failure  Does not appear to be in distress  Cont with supplemental o2  Monitor for sob, o2, Cont with duonebs   Hypertension  Bp running on the lower side Will decrease metoprolol  to 12.5 mg bid  Monitor bp   H/o PE Cont with eliquis   Constipation  Will start senna , colace    30 min Total time spent for obtaining history,  performing a medically appropriate examination and evaluation, reviewing the tests,   documenting clinical information in the electronic or other health record, I care coordination (not separately reported)

## 2023-10-09 ENCOUNTER — Non-Acute Institutional Stay (SKILLED_NURSING_FACILITY): Payer: Self-pay | Admitting: Sports Medicine

## 2023-10-09 ENCOUNTER — Encounter: Payer: Self-pay | Admitting: Sports Medicine

## 2023-10-09 DIAGNOSIS — J9601 Acute respiratory failure with hypoxia: Secondary | ICD-10-CM

## 2023-10-09 DIAGNOSIS — I959 Hypotension, unspecified: Secondary | ICD-10-CM | POA: Diagnosis not present

## 2023-10-09 DIAGNOSIS — K5900 Constipation, unspecified: Secondary | ICD-10-CM | POA: Diagnosis not present

## 2023-10-09 DIAGNOSIS — I2609 Other pulmonary embolism with acute cor pulmonale: Secondary | ICD-10-CM | POA: Diagnosis not present

## 2023-10-09 NOTE — Progress Notes (Signed)
 Location:  [facility] Room Number: [phone] Place of Service:  SNF (31) Provider:  Al Alias, MD  Patient Care Team: Faustina Hood, MD as PCP - General (Family Medicine) Wenona Hamilton, MD as PCP - Cardiology (Cardiology) Adelaide Adjutant, MD as Consulting Physician (Physical Medicine and Rehabilitation)  Extended Emergency Contact Information Primary Emergency Contact: May,Brian Address: [address]          [place], [place] [address] [place] Home Phone: [phone] Work Phone: [phone] Mobile Phone: [phone] Relation: Brother Secondary Emergency Contact: graham,marisa Mobile Phone: [phone] Relation: Daughter  Code Status:  Full Code  Goals of care: Advanced Directive information    09/11/2023   10:50 AM  Advanced Directives  Does Patient Have a Medical Advance Directive? Yes  Type of Advance Directive Healthcare Power of Attorney  Does patient want to make changes to medical advance directive? Yes (Inpatient - patient defers changing a medical advance directive and declines information at this time)  Copy of Healthcare Power of Attorney in Chart? No - copy requested     Chief Complaint  Patient presents with   Hypotension    Low blood pressure concerns     HPI:  Pt is a 73 y.o. female with past medical history of COPD, PE, hypoxic respiratory failure, diastolic dysfunction, history of right below-knee amputation ARDS is evaluated today for an acute visit for  low blood pressure. Patient seen and examined in her room. Patient is sitting in the recliner chair.  Seems pleasant and comfortable.  She is currently on 2 L nasal cannula.   As per physical therapy palpation is desaturating with her oxygen  and but dropping to 70s upon walking 10 to 20 feet and in 80s while sitting during exercises.  She is able to speak in full sentences, does not appear to be in distress. Complains of intermittent productive  cough.  She is able to lay on her bed with slight head elevation.  Denies chest pain, abdominal pain, nausea, vomiting, dysuria, hematuria, bloody or dark-colored stools.   Hypertension-staff reported that blood pressure has been running low Patient denies dizziness, blurring or double vision, slurring of speech, weakness. Metoprolol  dose was decreased last weekend.  She is currently getting midodrine  5 mg with each meals  Constipation Patient complains of passing small stools.  Denies abdominal pain, nausea, vomitings.    Past Medical History:  Diagnosis Date   Anxiety    Arthritis    Asthma    Avascular necrosis of talus (HCC)    Cancer (HCC)    vulva pre cancer had surgery   Depression    Gait abnormality 03/20/2017   GERD (gastroesophageal reflux disease)    Hearing loss    "very minor"   History of pneumonia    Hyperlipidemia    Hypertension    Leg edema    left   Memory difficulty 03/20/2017   PONV (postoperative nausea and vomiting)    Stress incontinence    Past Surgical History:  Procedure Laterality Date   ANKLE FUSION  2011   right multiple    ANKLE FUSION     rear ankle fusion   ANKLE SURGERY  2010   right cordicompression   AORTIC ARCH ANGIOGRAPHY N/A 12/11/2017   Procedure: AORTIC ARCH ANGIOGRAPHY;  Surgeon: Richrd Char, MD;  Location: [facility];  Service: Cardiovascular;  Laterality: N/A;   APPENDECTOMY  05/10/2016   BELOW KNEE LEG AMPUTATION  CATARACT EXTRACTION W/ INTRAOCULAR LENS IMPLANT Left    LAPAROSCOPIC APPENDECTOMY N/A 05/10/2016   Procedure: APPENDECTOMY LAPAROSCOPIC;  Surgeon: Oza Blumenthal, MD;  Location: [facility] OR;  Service: General;  Laterality: N/A;   LUMBAR LAMINECTOMY/DECOMPRESSION MICRODISCECTOMY N/A 09/09/2015   Procedure:  L4-S1 Decompression/ Discetomy;  Surgeon: Mort Ards, MD;  Location: [facility] OR;  Service: Orthopedics;  Laterality: N/A;   SKIN GRAFT     SPINAL CORD STIMULATOR BATTERY EXCHANGE  06/23/2023    SPINAL CORD STIMULATOR INSERTION N/A 01/10/2019   Procedure: LUMBAR SPINAL CORD STIMULATOR INSERTION;  Surgeon: Mort Ards, MD;  Location: [facility] OR;  Service: Orthopedics;  Laterality: N/A;  2.5 hrs   TONSILLECTOMY     TUBAL LIGATION  1983   VULVECTOMY N/A 01/28/2015   Procedure: WIDE EXCISION VULVECTOMY;  Surgeon: Johnn Najjar, MD;  Location: [facility] ORS;  Service: Gynecology;  Laterality: N/A;    Allergies  Allergen Reactions   Delsym [Dextromethorphan] Other (See Comments)    Cognitive impairment    Outpatient Encounter Medications as of 10/09/2023  Medication Sig   acyclovir cream (ZOVIRAX) 5 % Apply 1 Application topically 2 (two) times daily as needed (cold sore).   albuterol  (PROAIR  HFA) 108 (90 Base) MCG/ACT inhaler 2 puffs every 4 hours as needed only  if your can't catch your breath   apixaban  (ELIQUIS ) 5 MG TABS tablet Take 1 tablet (5 mg total) by mouth 2 (two) times daily.   benzonatate  (TESSALON ) 200 MG capsule Take 1 capsule (200 mg total) by mouth 3 (three) times daily as needed for cough. TAKE 1 CAPSULE(200 MG) BY MOUTH THREE TIMES DAILY AS NEEDED FOR COUGH   budesonide -formoterol  (SYMBICORT ) 160-4.5 MCG/ACT inhaler Inhale 2 puffs into the lungs in the morning and at bedtime.   buPROPion  (WELLBUTRIN  XL) 300 MG 24 hr tablet Take 300 mg by mouth daily.   calcium  carbonate (OSCAL) 1500 (600 Ca) MG TABS tablet Take 600 mg of elemental calcium  by mouth at bedtime.   denosumab  (PROLIA ) 60 MG/ML SOLN injection Inject 60 mg into the skin every 6 (six) months.    donepezil  (ARICEPT ) 10 MG tablet Take 1 tablet (10 mg total) by mouth at bedtime.   FLUoxetine  (PROZAC ) 20 MG capsule Take 20 mg by mouth daily.   ipratropium-albuterol  (DUONEB) 0.5-2.5 (3) MG/3ML SOLN Take 3 mLs by nebulization every 6 (six) hours as needed (Shortness of breath, cough and/or wheeze). Use this or albuterol  inhaler but not both.   lactose free nutrition (BOOST) LIQD Take 237 mLs by mouth daily. Prefers vanilla    MAGNESIUM  GLYCINATE PO Take 1 tablet by mouth at bedtime. 200 mg   melatonin 3 MG TABS tablet Take 3 mg by mouth at bedtime.   metoprolol  tartrate (LOPRESSOR ) 12.5 mg TABS tablet Take 12.5 mg by mouth 2 (two) times daily.   midodrine  (PROAMATINE ) 5 MG tablet Take 1 tablet (5 mg total) by mouth 3 (three) times daily with meals.   montelukast  (SINGULAIR ) 10 MG tablet TAKE 1 TABLET(10 MG) BY MOUTH DAILY   Multiple Vitamins-Minerals (CENTRUM SILVER WOMEN 50+) TABS Take 1 tablet by mouth daily.   ondansetron  (ZOFRAN -ODT) 4 MG disintegrating tablet Take 1 tablet (4 mg total) by mouth every 8 (eight) hours as needed for vomiting or nausea.   OXYGEN  Inhale into the lungs. O2 2L, Monitor O2 sat Qshift to maintain O2 Sat >95% every shift   pantoprazole  (PROTONIX ) 40 MG tablet TAKE 30 TO 60 MINUTES BEFORE THE FIRST AND LAST MEAL OF THE DAY  Potassium 99 MG TABS Take 99 mg by mouth at bedtime.   predniSONE  (DELTASONE ) 5 MG tablet Take  6 Pills PO for 4 days, 4 Pills PO for 4 days, 2 Pills PO for 4 days, 1 Pills PO for 4 days, 1/2 Pill  PO for 4 days then STOP.   rosuvastatin  (CRESTOR ) 10 MG tablet Take 1 tablet (10 mg total) by mouth every evening.   TURMERIC CURCUMIN PO Take 1 tablet by mouth daily.   valACYclovir (VALTREX) 1000 MG tablet Take 1,000 mg by mouth See admin instructions. Take 1 tablet (1000mg ) twice daily as needed for cold sores - continue until cold sores resolve.   metoprolol  tartrate (LOPRESSOR ) 25 MG tablet Take 1 tablet (25 mg total) by mouth 2 (two) times daily. (Patient not taking: Reported on 10/09/2023)   No facility-administered encounter medications on file as of 10/09/2023.    Review of Systems  Constitutional:  Negative for fever.  HENT:  Negative for sinus pressure and sore throat.   Respiratory:  Positive for cough and shortness of breath. Negative for wheezing.   Cardiovascular:  Negative for chest pain, palpitations and leg swelling.  Gastrointestinal:  Positive for  constipation. Negative for abdominal distention, abdominal pain, blood in stool, diarrhea, nausea and vomiting.  Genitourinary:  Negative for dysuria, frequency and urgency.  Neurological:  Negative for dizziness, weakness and numbness.  Psychiatric/Behavioral:  Negative for confusion.     Immunization History  Administered Date(s) Administered   Influenza Split 02/06/2011, 02/24/2011, 03/05/2013, 04/06/2014   Influenza, High Dose Seasonal PF 03/08/2021   Influenza,inj,Quad PF,6+ Mos 04/24/2015   Influenza,inj,quad, With Preservative 04/27/2017   Influenza-Unspecified 02/22/2011, 02/19/2012, 05/13/2016, 05/02/2017, 05/22/2018, 03/11/2019, 03/03/2020, 03/04/2021, 04/11/2022   PFIZER Comirnaty(Gray Top)Covid-19 Tri-Sucrose Vaccine 02/05/2023   PFIZER(Purple Top)SARS-COV-2 Vaccination 06/23/2019, 07/13/2019, 03/08/2020   PNEUMOCOCCAL CONJUGATE-20 09/24/2021   Pneumococcal Conjugate-13 11/15/2013   Pneumococcal Polysaccharide-23 12/25/2014   Tdap 04/11/2011, 09/24/2021, 06/25/2023   Unspecified SARS-COV-2 Vaccination 05/21/2021   Zoster Recombinant(Shingrix) 09/28/2017, 02/01/2018   Zoster, Live 05/07/2012, 09/28/2017, 02/01/2018   Pertinent  Health Maintenance Due  Topic Date Due   INFLUENZA VACCINE  01/05/2024   MAMMOGRAM  06/13/2025   Colonoscopy  03/22/2026   DEXA SCAN  Completed      01/10/2019   11:33 AM 01/10/2019    6:58 PM 01/10/2019    8:45 PM 01/11/2019    2:25 PM 03/02/2021    7:27 AM  Fall Risk  Falls in the past year?     1  Was there an injury with Fall?     1  Was there an injury with Fall? - Comments     bruises  Fall Risk Category Calculator     3  Fall Risk Category (Retired)     High  (RETIRED) Patient Fall Risk Level Moderate fall risk High fall risk Moderate fall risk Low fall risk    Functional Status Survey:    Vitals:   10/09/23 1047  BP: (!) 82/43  Pulse: 76  SpO2: 95%  Weight: 141 lb 8 oz (64.2 kg)  Height: 5\' 5"  (1.651 m)   Body mass index is  23.55 kg/m. Physical Exam Constitutional:      Appearance: Normal appearance.  HENT:     Head: Normocephalic and atraumatic.  Cardiovascular:     Rate and Rhythm: Normal rate and regular rhythm.  Pulmonary:     Effort: Pulmonary effort is normal. No respiratory distress.     Breath sounds: Normal breath sounds. No wheezing.  Comments: Coarse breath sounds left lung base  Abdominal:     General: Bowel sounds are normal. There is no distension.     Tenderness: There is no abdominal tenderness. There is no guarding.     Comments:    Musculoskeletal:     Comments: Rt above knee amputation  Neurological:     Mental Status: She is alert. Mental status is at baseline.     Motor: No weakness.     Labs reviewed: Recent Labs    09/03/23 0242 09/04/23 0357 09/05/23 0330 09/06/23 0404 09/11/23 0541 09/23/23 0438 09/25/23 0436 09/28/23 0450  NA  --  138 140 139   < > 136 137 135  K  --  3.2* 3.2* 3.7   < > 4.0 4.2 3.9  CL  --  101 104 105   < > 96* 99 96*  CO2  --  26 27 22    < > 29 32 28  GLUCOSE  --  150* 117* 95   < > 84 81 92  BUN  --  12 15 16    < > 16 14 20   CREATININE  --  0.68 0.66 0.73   < > 0.57 0.62 0.69  CALCIUM   --  7.6* 7.8* 7.9*   < > 8.5* 8.9 8.9  MG 1.8 2.0 2.2 1.8  --   --  2.0 1.9  PHOS 2.7 2.9 2.3*  --   --   --   --   --    < > = values in this interval not displayed.   Recent Labs    09/03/23 0200 09/05/23 0330 09/06/23 0404 09/11/23 0541  AST 53*  --  42* 29  ALT 46*  --  63* 35  ALKPHOS 64  --  57 71  BILITOT 1.2  --  0.7 0.9  PROT 5.9*  --  5.4* 6.0*  ALBUMIN  2.5* 2.1* 2.2* 2.4*   Recent Labs    09/11/23 0541 09/11/23 0550 09/23/23 0438 09/25/23 0436 09/28/23 0450  WBC 18.2*   < > 12.9* 13.2* 16.1*  NEUTROABS 16.0*  --   --  9.3* 12.6*  HGB 10.7*   < > 9.6* 10.1* 11.0*  HCT 34.5*   < > 29.7* 31.4* 33.4*  MCV 100.3*   < > 96.4 96.3 95.4  PLT 297   < > 310 314 322   < > = values in this interval not displayed.   Lab Results   Component Value Date   TSH 1.153 08/30/2023   No results found for: "HGBA1C" No results found for: "CHOL", "HDL", "LDLCALC", "LDLDIRECT", "TRIG", "CHOLHDL"  Significant Diagnostic Results in last 30 days:  DG Chest Port 1 View Result Date: 09/27/2023 CLINICAL DATA:  Shortness of breath EXAM: PORTABLE CHEST 1 VIEW COMPARISON:  09/24/2023 FINDINGS: Cardiac shadow is stable. Spinal stimulator is again noted. Bibasilar airspace opacities are again seen and stable. No new focal infiltrate is noted. No effusion is seen. Bony structures appear within normal limits. IMPRESSION: Stable basilar airspace opacities Electronically Signed   By: Violeta Grey M.D.   On: 09/27/2023 11:21   DG Chest Port 1 View Result Date: 09/24/2023 CLINICAL DATA:  Shortness of breath EXAM: PORTABLE CHEST 1 VIEW COMPARISON:  09/17/2023 FINDINGS: Lungs are hypoinflated. Stable cardiomediastinal contours. Unchanged bilateral lower lung zone reticular interstitial opacities. Compared with the previous exam there has been no significant change in aeration to lungs. Visualized osseous structures appear unremarkable. IMPRESSION: 1. Persistent bilateral reticular interstitial opacities  with a lower lung zone predominance. No new findings. Electronically Signed   By: Kimberley Penman M.D.   On: 09/24/2023 08:39   DG Chest Port 1 View Result Date: 09/17/2023 CLINICAL DATA:  Dyspnea EXAM: PORTABLE CHEST 1 VIEW COMPARISON:  September 15, 2023 FINDINGS: Low lung volumes. Similar distribution of the patchy airspace opacities throughout the lungs. No pneumothorax. Blunting of both costophrenic sulci, possible left and small pleural effusions. Unchanged cardiomegaly. Tortuous aorta with aortic atherosclerosis. Multilevel thoracic osteophytosis. Spinal stimulator device leads again noted in the mid thorax. IMPRESSION: No significant interval change to the airspace disease throughout the lungs. Electronically Signed   By: Rance Burrows M.D.   On:  09/17/2023 08:46   DG Chest Port 1V same Day Result Date: 09/15/2023 CLINICAL DATA:  141880 SOB (shortness of breath) 141880. EXAM: PORTABLE CHEST 1 VIEW COMPARISON:  09/12/2023. FINDINGS: Low lung volume. Redemonstration of bilateral (left more than right) heterogeneous nonspecific opacities with relative sparing of the upper lung zones. No significant interval change. There is blunting of bilateral lateral costophrenic angles, which may represent underlying bilateral small pleural effusions. No pneumothorax. Stable cardio-mediastinal silhouette. No acute osseous abnormalities. The soft tissues are within normal limits. IMPRESSION: *No significant interval change in the bilateral heterogeneous opacities. Differential diagnosis includes multilobar pneumonia, ARDS, pulmonary edema, etc. Electronically Signed   By: Beula Brunswick M.D.   On: 09/15/2023 09:17   DG Swallowing Func-Speech Pathology Result Date: 09/14/2023 Table formatting from the original result was not included. Modified Barium Swallow Study Study completed and documented by Aurelia Leeks, SLP Student Supervised and reviewed by Noble Bateman MA CCC-SLP Patient Details Name: Dylilah Staser MRN: [id number] Date of Birth: [birth year] Today's Date: 09/14/2023 HPI/PMH: HPI: 67 yoF presenting from home with recurrent SOB and hypoxia.  Recently hospitalized 3/26- 4/2 for hypoxic respiratory failure felt related to rhinovirus, RLL strep PNA +/- aspiration PNA (given N/V), possible AECOPD.  Discharged home on 3-4L [place] with 3 days left on steroid taper. Felt initially better but with progressive SOB, found hypoxic in the 60's on HOT w/ EMS improved on HFNC.  No reported recent fevers, increased cough, productive at times, denies any dysphagia, and complains slightly of left calf pain.  Placed on BiPAP for increased WOB in ER, requiring 60%.  Afebrile, low to normotensive BP.  CXR showing multifocal infiltrates.  Since in ER awaiting bed, unable to tolerate  BiPAP wean due to WOB and hypoxia. CT angiogram of the chest revealed concerns for scattered left-sided pulmonary emboli with mild clot burden, and changing parenchymal lung opacities with areas of tissue and septal thickening left lung worse than the right. Pt evalauted by SLP in initial admission, no finding of dysphagia. Clinical Impression: Clinical Impression: Pt presents with swallowing grossly WFL. Oral phase of swallow featured brisk bolus transit and complete clearance of bolus with no residue. Pt displayed consistent delayed swallow initiation at level of pyriform sinuses, which resulted in a single event of flash penetration with thin-liquids during multiple sips; pt states that she typically takes single sips when she drinks. Chin-tuck appeared to be effective for providing additional support for airway safety prior to swallow initiation. Hyolaryngeal excursion was adequate with complete epiglottic inversion. Pharyngeal residue was only trace. Tongue base retraction also appeared WFL. An esophageal sweep was completed with complete clearance of bolus and pill. Due to pt healing from oral lesions and lack of dentures during study (pt has dentures typically), only a small piece of solid was presented. Pt  masticated solid completely and had no issues with the swallow. Recommend a dysphagia 3 (mech soft) and thin liquid diet. SLP will f/u to complete a diet check and to provide education about compensatory strategies to optimize pt's wellbeing. Factors that may increase risk of adverse event in presence of aspiration Roderick Civatte & Jessy Morocco 2021): Factors that may increase risk of adverse event in presence of aspiration Roderick Civatte & Jessy Morocco 2021): Respiratory or GI disease Recommendations/Plan: Swallowing Evaluation Recommendations Swallowing Evaluation Recommendations Recommendations: PO diet PO Diet Recommendation: Dysphagia 3 (Mechanical soft); Thin liquids (Level 0) Liquid Administration via: Cup; Straw  Medication Administration: Whole meds with liquid Supervision: Patient able to self-feed Swallowing strategies  : Slow rate; Chin tuck Postural changes: Position pt fully upright for meals Oral care recommendations: Oral care BID (2x/day) Treatment Plan Treatment Plan Treatment recommendations: Therapy as outlined in treatment plan below Follow-up recommendations: Follow physicians's recommendations for discharge plan and follow up therapies Treatment frequency: Min 1x/week Treatment duration: 2 weeks Interventions: Diet toleration management by SLP; Patient/family education Recommendations Recommendations for follow up therapy are one component of a multi-disciplinary discharge planning process, led by the attending physician.  Recommendations may be updated based on patient status, additional functional criteria and insurance authorization. Assessment: Orofacial Exam: Orofacial Exam Oral Cavity - Dentition: Dentures, not available; Missing dentition Anatomy: Anatomy: Prominent cricopharyngeus Boluses Administered: Boluses Administered Boluses Administered: Thin liquids (Level 0); Mildly thick liquids (Level 2, nectar thick); Puree; Solid  Oral Impairment Domain: Oral Impairment Domain Lip Closure: No labial escape Tongue control during bolus hold: Cohesive bolus between tongue to palatal seal Bolus preparation/mastication: Timely and efficient chewing and mashing Bolus transport/lingual motion: Brisk tongue motion Oral residue: Complete oral clearance Location of oral residue : N/A Initiation of pharyngeal swallow : Pyriform sinuses  Pharyngeal Impairment Domain: Pharyngeal Impairment Domain Soft palate elevation: No bolus between soft palate (SP)/pharyngeal wall (PW) Laryngeal elevation: Complete superior movement of thyroid  cartilage with complete approximation of arytenoids to epiglottic petiole Anterior hyoid excursion: Complete anterior movement Epiglottic movement: Complete inversion Laryngeal vestibule  closure: Incomplete, narrow column air/contrast in laryngeal vestibule Pharyngeal stripping wave : Present - complete Pharyngeal contraction (A/P view only): N/A Pharyngoesophageal segment opening: Complete distension and complete duration, no obstruction of flow Tongue base retraction: No contrast between tongue base and posterior pharyngeal wall (PPW) Pharyngeal residue: Trace residue within or on pharyngeal structures Location of pharyngeal residue: Valleculae  Esophageal Impairment Domain: Esophageal Impairment Domain Esophageal clearance upright position: Complete clearance, esophageal coating Pill: Pill Consistency administered: Thin liquids (Level 0) Thin liquids (Level 0): [facility] Penetration/Aspiration Scale Score: Penetration/Aspiration Scale Score 1.  Material does not enter airway: Mildly thick liquids (Level 2, nectar thick); Puree; Solid; Pill 2.  Material enters airway, remains ABOVE vocal cords then ejected out: Thin liquids (Level 0) Compensatory Strategies: Compensatory Strategies Compensatory strategies: Yes Chin tuck: Effective   General Information: Caregiver present: No  No data recorded  Temperature : Normal   Respiratory Status: WFL   Supplemental O2: High flow nasal cannula   History of Recent Intubation: No  Behavior/Cognition: Alert; Cooperative; Pleasant mood Self-Feeding Abilities: Able to self-feed Baseline vocal quality/speech: Normal Volitional Cough: Able to elicit Volitional Swallow: Able to elicit Exam Limitations: No limitations Goal Planning: Prognosis for improved oropharyngeal function: Good No data recorded No data recorded No data recorded Consulted and agree with results and recommendations: Patient Pain: Pain Assessment Pain Assessment: No/denies pain Faces Pain Scale: 4 Pain Location: buttocks Pain Descriptors / Indicators: Sore Pain Intervention(s): Monitored  during session; Repositioned; Other (comment) (pillows, upright position in chair with sacrum placed against back rest  of chair.) End of Session: Start Time:SLP Start Time (ACUTE ONLY): 1210 Stop Time: SLP Stop Time (ACUTE ONLY): 1228 Time Calculation:SLP Time Calculation (min) (ACUTE ONLY): 18 min Charges: SLP Evaluations $ SLP Speech Visit: 1 Visit SLP Evaluations $MBS Swallow: 1 Procedure $Swallowing Treatment: 1 Procedure SLP visit diagnosis: No data recorded Past Medical History: Past Medical History: Diagnosis Date  Anxiety   Arthritis   Asthma   Avascular necrosis of talus (HCC)   Cancer (HCC)   vulva pre cancer had surgery  Depression   Gait abnormality 03/20/2017  GERD (gastroesophageal reflux disease)   Hearing loss   "very minor"  History of pneumonia   Hyperlipidemia   Hypertension   Leg edema   left  Memory difficulty 03/20/2017  PONV (postoperative nausea and vomiting)   Stress incontinence  Past Surgical History: Past Surgical History: Procedure Laterality Date  ANKLE FUSION  2011  right multiple   ANKLE FUSION    rear ankle fusion  ANKLE SURGERY  2010  right cordicompression  AORTIC ARCH ANGIOGRAPHY N/A 12/11/2017  Procedure: AORTIC ARCH ANGIOGRAPHY;  Surgeon: Richrd Char, MD;  Location: [facility];  Service: Cardiovascular;  Laterality: N/A;  APPENDECTOMY  05/10/2016  BELOW KNEE LEG AMPUTATION    CATARACT EXTRACTION W/ INTRAOCULAR LENS IMPLANT Left   LAPAROSCOPIC APPENDECTOMY N/A 05/10/2016  Procedure: APPENDECTOMY LAPAROSCOPIC;  Surgeon: Oza Blumenthal, MD;  Location: [facility] OR;  Service: General;  Laterality: N/A;  LUMBAR LAMINECTOMY/DECOMPRESSION MICRODISCECTOMY N/A 09/09/2015  Procedure:  L4-S1 Decompression/ Discetomy;  Surgeon: Mort Ards, MD;  Location: [facility] OR;  Service: Orthopedics;  Laterality: N/A;  SKIN GRAFT    SPINAL CORD STIMULATOR BATTERY EXCHANGE  06/23/2023  SPINAL CORD STIMULATOR INSERTION N/A 01/10/2019  Procedure: LUMBAR SPINAL CORD STIMULATOR INSERTION;  Surgeon: Mort Ards, MD;  Location: [facility] OR;  Service: Orthopedics;  Laterality: N/A;  2.5 hrs  TONSILLECTOMY    TUBAL LIGATION   1983  VULVECTOMY N/A 01/28/2015  Procedure: WIDE EXCISION VULVECTOMY;  Surgeon: Johnn Najjar, MD;  Location: [facility] ORS;  Service: Gynecology;  Laterality: N/A; DeBlois, Hardin Leys 09/14/2023, 2:30 PM  VAS US  LOWER EXTREMITY VENOUS (DVT) Result Date: 09/12/2023  Lower Venous DVT Study Patient Name:  Khali SHAMANE CARGILL  Date of Exam:   09/12/2023 Medical Rec #: [id number]          Accession #:    [id number] Date of Birth: 08/21/1950          Patient Gender: F Patient Age:   29 years Exam Location:  [facility] Procedure:      VAS US  LOWER EXTREMITY VENOUS (DVT) Referring Phys: PAULA SIMPSON --------------------------------------------------------------------------------  Indications: SOB, and pulmonary embolism.  Comparison Study: Previous study on 10.4.2024. Performing Technologist: Ria Chad  Examination Guidelines: A complete evaluation includes B-mode imaging, spectral Doppler, color Doppler, and power Doppler as needed of all accessible portions of each vessel. Bilateral testing is considered an integral part of a complete examination. Limited examinations for reoccurring indications may be performed as noted. The reflux portion of the exam is performed with the patient in reverse Trendelenburg.  +---------+---------------+---------+-----------+----------+--------------+ RIGHT    CompressibilityPhasicitySpontaneityPropertiesThrombus Aging +---------+---------------+---------+-----------+----------+--------------+ CFV      Full           Yes      Yes                                 +---------+---------------+---------+-----------+----------+--------------+  SFJ      Full           Yes      Yes                                 +---------+---------------+---------+-----------+----------+--------------+ FV Prox  Full                                                        +---------+---------------+---------+-----------+----------+--------------+ FV Mid   Full                                                         +---------+---------------+---------+-----------+----------+--------------+ FV DistalFull                                                        +---------+---------------+---------+-----------+----------+--------------+ PFV      Full                                                        +---------+---------------+---------+-----------+----------+--------------+ POP      Full           Yes      Yes                                 +---------+---------------+---------+-----------+----------+--------------+ Right BKA  +---------+---------------+---------+-----------+----------+--------------+ LEFT     CompressibilityPhasicitySpontaneityPropertiesThrombus Aging +---------+---------------+---------+-----------+----------+--------------+ CFV      Full           Yes      Yes                                 +---------+---------------+---------+-----------+----------+--------------+ SFJ      Full           Yes      Yes                                 +---------+---------------+---------+-----------+----------+--------------+ FV Prox  Full                                                        +---------+---------------+---------+-----------+----------+--------------+ FV Mid   Full                                                        +---------+---------------+---------+-----------+----------+--------------+  FV DistalFull                                                        +---------+---------------+---------+-----------+----------+--------------+ PFV      Full                                                        +---------+---------------+---------+-----------+----------+--------------+ POP      Full           Yes      Yes                                 +---------+---------------+---------+-----------+----------+--------------+ PTV      Full                                                         +---------+---------------+---------+-----------+----------+--------------+ PERO     None           No       No                                  +---------+---------------+---------+-----------+----------+--------------+ Only one of the paired peroneal veins is thrombosed.    Summary: RIGHT: - There is no evidence of deep vein thrombosis in the lower extremity.  - No cystic structure found in the popliteal fossa.  LEFT: - Findings consistent with age indeterminate deep vein thrombosis involving the left peroneal veins.  - No cystic structure found in the popliteal fossa.  *See table(s) above for measurements and observations. Electronically signed by Angela Kell MD on 09/12/2023 at 7:46:34 PM.    Final    ECHOCARDIOGRAM LIMITED Result Date: 09/12/2023    ECHOCARDIOGRAM LIMITED REPORT   Patient Name:   Eternity Lindsay Rho Date of Exam: 09/12/2023 Medical Rec #:  [id number]         Height:       65.0 in Accession #:    [id number]        Weight:       148.4 lb Date of Birth:  10-09-1950         BSA:          1.742 m Patient Age:    72 years          BP:           91/42 mmHg Patient Gender: F                 HR:           90 bpm. Exam Location:  Inpatient Procedure: Limited Echo (Both Spectral and Color Flow Doppler were utilized            during procedure). Indications:    Pulmonary Embolism  History:        Patient has prior history of Echocardiogram examinations, most  recent 08/31/2023. Risk Factors:Hypertension.  Sonographer:    Astrid Blamer Referring Phys: [id number] RONDELL A SMITH IMPRESSIONS  1. Left ventricular ejection fraction, by estimation, is 65 to 70%. The left ventricle has normal function.  2. Basal function is preserved. Right ventricular systolic function was not well visualized. The right ventricular size is not well visualized.  3. The mitral valve is grossly normal.  4. Limited study. Comparison(s): No significant change from prior study. Prior images reviewed side by side.  FINDINGS  Left Ventricle: Left ventricular ejection fraction, by estimation, is 65 to 70%. The left ventricle has normal function. The left ventricular internal cavity size was normal in size. There is no left ventricular hypertrophy. Right Ventricle: Basal function is preserved. The right ventricular size is not well visualized. Right ventricular systolic function was not well visualized. Left Atrium: Left atrial size was normal in size. Right Atrium: Right atrial size was normal in size. Pericardium: There is no evidence of pericardial effusion. Mitral Valve: The mitral valve is grossly normal. Aorta: The aortic root and ascending aorta are structurally normal, with no evidence of dilitation. LEFT VENTRICLE PLAX 2D LVIDd:         3.80 cm LVIDs:         2.00 cm LV PW:         0.70 cm LV IVS:        0.90 cm LVOT diam:     1.70 cm LVOT Area:     2.27 cm  LEFT ATRIUM             Index LA Vol (A2C):   33.6 ml 19.28 ml/m LA Vol (A4C):   33.5 ml 19.23 ml/m LA Biplane Vol: 36.7 ml 21.06 ml/m   AORTA Ao Root diam: 2.80 cm  SHUNTS Systemic Diam: 1.70 cm Gloriann Larger MD Electronically signed by Gloriann Larger MD Signature Date/Time: 09/12/2023/10:19:07 AM    Final    DG Chest Port 1 View Result Date: 09/12/2023 CLINICAL DATA:  Respiratory failure. EXAM: PORTABLE CHEST 1 VIEW COMPARISON:  September 11, 2023. FINDINGS: Stable cardiomediastinal silhouette. Stable bilateral lung opacities are noted concerning for pneumonia. Bony thorax is unremarkable. Elevated left hemidiaphragm. IMPRESSION: Stable bilateral lung opacities are noted concerning for multifocal pneumonia. Electronically Signed   By: Rosalene Colon M.D.   On: 09/12/2023 09:50   CT Angio Chest Pulmonary Embolism (PE) W or WO Contrast Result Date: 09/11/2023 CLINICAL DATA:  Shortness of breath. Questionable sepsis. Positive D-dimer EXAM: CT ANGIOGRAPHY CHEST WITH CONTRAST TECHNIQUE: Multidetector CT imaging of the chest was performed using the  standard protocol during bolus administration of intravenous contrast. Multiplanar CT image reconstructions and MIPs were obtained to evaluate the vascular anatomy. RADIATION DOSE REDUCTION: This exam was performed according to the departmental dose-optimization program which includes automated exposure control, adjustment of the mA and/or kV according to patient size and/or use of iterative reconstruction technique. CONTRAST:  75mL OMNIPAQUE  IOHEXOL  350 MG/ML SOLN COMPARISON:  X-ray earlier 09/11/2023.  CTA chest 01/11/2019. FINDINGS: Cardiovascular: Trace pericardial fluid. Heart nonenlarged. The thoracic aorta is normal course and caliber with calcified scattered atherosclerotic plaque. There is plaque extending along the great vessels as well. Some enlargement of the pulmonary artery. Please correlate for pulmonary artery hypertension. There is some pulmonary emboli identified. These include areas along the medial aspect of the lower middle lobe, anteromedial left upper lobe. Few smaller areas as well. Overall mild clot burden. Mediastinum/Nodes: Preserved thyroid  gland. Patulous esophagus. There are some small less than  1 cm in size mediastinal nodes identified, nonpathologic by size criteria. There are some calcified nodes as well including in the subcarinal region of the mediastinum and left hilum consistent with old granulomatous disease. There also several mildly enlarged mediastinal nodes. Example towards the AP window measuring 16 by 10 mm. Paratracheal nodes are seen, subcarinal. No specific abnormal lymph node enlargement in the axillary regions or hila. Lungs/Pleura: Extensive patchy areas of ground-glass with interstitial septal thickening, possible components of crazy paving. Distribution includes diffusely in the left lung with some mild areas of sparing at the left lung apex and superior segment of the left lower lobe medially. Less areas in the right lung with some specific areas such as right  lung apex as well as more confluent areas in the right lower lobe with patchy areas in the middle lobe. These are new from the remote CT scan of the chest compared to the abdomen pelvis CT of 08/30/2023 the lung bases the opacities have changed. Less confluent at the right lower lobe with new areas elsewhere in the visualized thorax. No frank consolidation. No pneumothorax or effusion. Upper Abdomen: Adrenal glands are preserved in the upper abdomen. Hepatic and splenic granulomas are identified. Musculoskeletal: Mild curvature of the spine. Moderate degenerative changes scattered along the spine. There is neurostimulator leads extending along the central canal of the spine. Battery pack is not included in the imaging field nor is the entirety of the soft tissue course of the leads. Review of the MIP images confirms the above findings. IMPRESSION: Few scattered segmental pulmonary emboli such as medial left upper lobe and middle lobe. Mild clot burden. Changing parenchymal lung opacities with areas of interstitial septal thickening ground-glass today. Almost crazy paving in appearance. More left lung than right. In addition there are several enlarged mediastinal nodes. These could be reactive with the lung findings. Please correlate with clinical presentation and follow-up. Some enlargement of the main pulmonary arteries. Please correlate for pulmonary artery hypertension. Evidence of old granulomatous disease. Critical Value/emergent results were called by telephone at the time of interpretation on 09/11/2023 at 1:44 pm to provider PA Randsom , who verbally acknowledged these results. Aortic Atherosclerosis (ICD10-I70.0). Electronically Signed   By: Adrianna Horde M.D.   On: 09/11/2023 13:48   DG Chest Port 1 View Result Date: 09/11/2023 CLINICAL DATA:  Evaluate shortness of breath.  Questionable sepsis. EXAM: PORTABLE CHEST 1 VIEW COMPARISON:  09/04/2023 FINDINGS: Stable cardiomediastinal contours. Progressive  bilateral airspace opacities. There is been increase in left mid and left lower lung airspace disease with persistent opacification of the right lower lung. No pneumothorax. No significant pleural effusion. Thoracic scoliosis. Dorsal column stimulator noted with lead terminating in the midthoracic spine. IMPRESSION: Progressive bilateral airspace opacities compatible with multifocal pneumonia. Electronically Signed   By: Kimberley Penman M.D.   On: 09/11/2023 05:55    Assessment/Plan   Hypoxic respiratory failure Pulmonary embolism As per physical therapy patient is desaturating with exertion. Instructed staff to check her O2 sat every shift and titrate to keep her O2 sat above 92% Patient is able to speak in full sentences, does not appear to be in distress during my examination today. Continue with the prednisone  taper Cont with symbicort   Cont with albuterol  prn  Follow-up with pulmonology in 2 weeks   Hypertension Patient denies dizzy, lightheadedness Will stop metoprolol  Continue with midodrine  Monitor blood pressure daily   Anemia Will get CBC No signs of bleeding   Constipation Started on bowel regimen last  week Will instruct staff to give  miralax  today   30 min Total time spent for obtaining history,  performing a medically appropriate examination and evaluation, reviewing the tests,ordering  tests,  documenting clinical information in the electronic or other health record,  care coordination (not separately reported)

## 2023-10-10 LAB — BASIC METABOLIC PANEL WITH GFR
BUN: 33 — AB (ref 4–21)
CO2: 31 — AB (ref 13–22)
Chloride: 103 (ref 99–108)
Creatinine: 0.5 (ref 0.5–1.1)
Glucose: 83
Potassium: 4.3 meq/L (ref 3.5–5.1)
Sodium: 140 (ref 137–147)

## 2023-10-10 LAB — CBC AND DIFFERENTIAL
HCT: 31 — AB (ref 36–46)
Hemoglobin: 10 — AB (ref 12.0–16.0)
Platelets: 234 10*3/uL (ref 150–400)
WBC: 8.9

## 2023-10-10 LAB — CBC: RBC: 3.3 — AB (ref 3.87–5.11)

## 2023-10-10 LAB — COMPREHENSIVE METABOLIC PANEL WITH GFR
Calcium: 8.3 — AB (ref 8.7–10.7)
eGFR: 99

## 2023-10-17 LAB — CBC AND DIFFERENTIAL
HCT: 32 — AB (ref 36–46)
Hemoglobin: 10 — AB (ref 12.0–16.0)
Neutrophils Absolute: 3478
Platelets: 276 10*3/uL (ref 150–400)
WBC: 6.6

## 2023-10-17 LAB — CBC: RBC: 3.29 — AB (ref 3.87–5.11)

## 2023-10-19 ENCOUNTER — Inpatient Hospital Stay: Admitting: Internal Medicine

## 2023-10-23 ENCOUNTER — Encounter: Payer: Self-pay | Admitting: Nurse Practitioner

## 2023-10-23 ENCOUNTER — Non-Acute Institutional Stay (SKILLED_NURSING_FACILITY): Payer: Self-pay | Admitting: Nurse Practitioner

## 2023-10-23 DIAGNOSIS — F0280 Dementia in other diseases classified elsewhere without behavioral disturbance: Secondary | ICD-10-CM | POA: Diagnosis not present

## 2023-10-23 DIAGNOSIS — F419 Anxiety disorder, unspecified: Secondary | ICD-10-CM

## 2023-10-23 DIAGNOSIS — K219 Gastro-esophageal reflux disease without esophagitis: Secondary | ICD-10-CM

## 2023-10-23 DIAGNOSIS — J9601 Acute respiratory failure with hypoxia: Secondary | ICD-10-CM

## 2023-10-23 DIAGNOSIS — E0859 Diabetes mellitus due to underlying condition with other circulatory complications: Secondary | ICD-10-CM | POA: Insufficient documentation

## 2023-10-23 DIAGNOSIS — G309 Alzheimer's disease, unspecified: Secondary | ICD-10-CM

## 2023-10-23 DIAGNOSIS — D649 Anemia, unspecified: Secondary | ICD-10-CM | POA: Diagnosis not present

## 2023-10-23 DIAGNOSIS — F32A Depression, unspecified: Secondary | ICD-10-CM

## 2023-10-23 DIAGNOSIS — K5901 Slow transit constipation: Secondary | ICD-10-CM | POA: Insufficient documentation

## 2023-10-23 DIAGNOSIS — I2699 Other pulmonary embolism without acute cor pulmonale: Secondary | ICD-10-CM

## 2023-10-23 DIAGNOSIS — J9811 Atelectasis: Secondary | ICD-10-CM | POA: Diagnosis not present

## 2023-10-23 DIAGNOSIS — I10 Essential (primary) hypertension: Secondary | ICD-10-CM | POA: Diagnosis not present

## 2023-10-23 DIAGNOSIS — Q791 Other congenital malformations of diaphragm: Secondary | ICD-10-CM | POA: Diagnosis not present

## 2023-10-23 NOTE — Assessment & Plan Note (Signed)
Stable, on MiraLax 

## 2023-10-23 NOTE — Assessment & Plan Note (Signed)
 Acute respiratory failure with hypoxia, resolved an episode of SOB, O2 desaturation, HR 120s after O2 [place] and Neb/HFA used, followed by Pulmonology

## 2023-10-23 NOTE — Assessment & Plan Note (Signed)
taking Donepezil

## 2023-10-23 NOTE — Assessment & Plan Note (Signed)
 Vit B12 1285 09/12/23, Iron 42 09/15/23, Hgb 10.0  5.13.25

## 2023-10-23 NOTE — Assessment & Plan Note (Signed)
on Eliquis 

## 2023-10-23 NOTE — Assessment & Plan Note (Addendum)
 Blood pressure is controlled, on midodrine , off Metoprolol , Bun/creat 16/0.49 10/17/23

## 2023-10-23 NOTE — Assessment & Plan Note (Signed)
 S/p R BKA, obtain Hgb A1c, TSH, lipids, CBC/diff, CMP/eGFR

## 2023-10-23 NOTE — Assessment & Plan Note (Signed)
Stable, on Fluoxetine

## 2023-10-23 NOTE — Assessment & Plan Note (Signed)
Stable, on Pantoprazole 

## 2023-10-23 NOTE — Progress Notes (Signed)
 Location:   [facility] Room Number: 20 Place of Service:  SNF (31) Provider: Abner Hoffman Shelly Speights NP  Faustina Hood, MD  Patient Care Team: Faustina Hood, MD as PCP - General (Family Medicine) Wenona Hamilton, MD as PCP - Cardiology (Cardiology) Adelaide Adjutant, MD as Consulting Physician (Physical Medicine and Rehabilitation)  Extended Emergency Contact Information Primary Emergency Contact: May,Brian Address: [address]          [place], [place] [address] [place] Home Phone: [phone] Work Phone: [phone] Mobile Phone: [phone] Relation: Brother Secondary Emergency Contact: graham,marisa Mobile Phone: [phone] Relation: Daughter  Code Status:  DNR Goals of care: Advanced Directive information    09/11/2023   10:50 AM  Advanced Directives  Does Patient Have a Medical Advance Directive? Yes  Type of Advance Directive Healthcare Power of Attorney  Does patient want to make changes to medical advance directive? Yes (Inpatient - patient defers changing a medical advance directive and declines information at this time)  Copy of Healthcare Power of Attorney in Chart? No - copy requested     Chief Complaint  Patient presents with   Medical Management of Chronic Issues    HPI:  Pt is a 73 y.o. female seen today for medical management of chronic diseases.      Acute respiratory failure with hypoxia, resolved an episode of SOB, O2 desaturation, HR 120s after O2 [place] and Neb/HFA used, followed by Pulmonology. On Montelukast  and Tessalon   PE, on Eliquis   Hypotension, on midodrine , off Metoprolol , Bun/creat 16/0.49 10/17/23  Anemia, Vit B12 1285 09/12/23, Iron 42 09/15/23, Hgb 10.0  5.13.25  Constipation, on MiraLax   Cognitive impairment, taking Donepezil .   Depression, on Fluoxetine .   GERD, on Pantoprazole   Hx of T2DM, s/p R BKA  OP, on Prolia .   LDL taking Rosuvastatin .   Past Medical History:  Diagnosis Date   Anxiety    Arthritis     Asthma    Avascular necrosis of talus (HCC)    Cancer (HCC)    vulva pre cancer had surgery   Depression    Gait abnormality 03/20/2017   GERD (gastroesophageal reflux disease)    Hearing loss    "very minor"   History of pneumonia    Hyperlipidemia    Hypertension    Leg edema    left   Memory difficulty 03/20/2017   PONV (postoperative nausea and vomiting)    Stress incontinence    Past Surgical History:  Procedure Laterality Date   ANKLE FUSION  2011   right multiple    ANKLE FUSION     rear ankle fusion   ANKLE SURGERY  2010   right cordicompression   AORTIC ARCH ANGIOGRAPHY N/A 12/11/2017   Procedure: AORTIC ARCH ANGIOGRAPHY;  Surgeon: Richrd Char, MD;  Location: [facility];  Service: Cardiovascular;  Laterality: N/A;   APPENDECTOMY  05/10/2016   BELOW KNEE LEG AMPUTATION     CATARACT EXTRACTION W/ INTRAOCULAR LENS IMPLANT Left    LAPAROSCOPIC APPENDECTOMY N/A 05/10/2016   Procedure: APPENDECTOMY LAPAROSCOPIC;  Surgeon: Oza Blumenthal, MD;  Location: [facility] OR;  Service: General;  Laterality: N/A;   LUMBAR LAMINECTOMY/DECOMPRESSION MICRODISCECTOMY N/A 09/09/2015   Procedure:  L4-S1 Decompression/ Discetomy;  Surgeon: Mort Ards, MD;  Location: [facility] OR;  Service: Orthopedics;  Laterality: N/A;   SKIN GRAFT     SPINAL CORD STIMULATOR BATTERY EXCHANGE  06/23/2023   SPINAL CORD STIMULATOR INSERTION N/A 01/10/2019   Procedure: LUMBAR SPINAL CORD STIMULATOR  INSERTION;  Surgeon: Mort Ards, MD;  Location: [facility] OR;  Service: Orthopedics;  Laterality: N/A;  2.5 hrs   TONSILLECTOMY     TUBAL LIGATION  1983   VULVECTOMY N/A 01/28/2015   Procedure: WIDE EXCISION VULVECTOMY;  Surgeon: Johnn Najjar, MD;  Location: [facility] ORS;  Service: Gynecology;  Laterality: N/A;    Allergies  Allergen Reactions   Delsym [Dextromethorphan] Other (See Comments)    Cognitive impairment    Allergies as of 10/23/2023       Reactions   Delsym [dextromethorphan] Other (See  Comments)   Cognitive impairment        Medication List        Accurate as of Oct 23, 2023 12:38 PM. If you have any questions, ask your nurse or doctor.          acyclovir cream 5 % Commonly known as: ZOVIRAX Apply 1 Application topically 2 (two) times daily as needed (cold sore).   albuterol  108 (90 Base) MCG/ACT inhaler Commonly known as: ProAir  HFA 2 puffs every 4 hours as needed only  if your can't catch your breath   apixaban  5 MG Tabs tablet Commonly known as: ELIQUIS  Take 1 tablet (5 mg total) by mouth 2 (two) times daily.   benzonatate  200 MG capsule Commonly known as: TESSALON  Take 1 capsule (200 mg total) by mouth 3 (three) times daily as needed for cough. TAKE 1 CAPSULE(200 MG) BY MOUTH THREE TIMES DAILY AS NEEDED FOR COUGH   budesonide -formoterol  160-4.5 MCG/ACT inhaler Commonly known as: Symbicort  Inhale 2 puffs into the lungs in the morning and at bedtime.   buPROPion  300 MG 24 hr tablet Commonly known as: WELLBUTRIN  XL Take 300 mg by mouth daily.   calcium  carbonate 1500 (600 Ca) MG Tabs tablet Commonly known as: OSCAL Take 600 mg of elemental calcium  by mouth at bedtime.   Centrum Silver Women 50+ Tabs Take 1 tablet by mouth daily.   denosumab  60 MG/ML Soln injection Commonly known as: PROLIA  Inject 60 mg into the skin every 6 (six) months.   donepezil  10 MG tablet Commonly known as: ARICEPT  Take 1 tablet (10 mg total) by mouth at bedtime.   FLUoxetine  20 MG capsule Commonly known as: PROZAC  Take 20 mg by mouth daily.   ipratropium-albuterol  0.5-2.5 (3) MG/3ML Soln Commonly known as: DUONEB Take 3 mLs by nebulization every 6 (six) hours as needed (Shortness of breath, cough and/or wheeze). Use this or albuterol  inhaler but not both.   lactose free nutrition Liqd Take 237 mLs by mouth daily. Prefers vanilla   MAGNESIUM  GLYCINATE PO Take 1 tablet by mouth at bedtime. 200 mg   melatonin 3 MG Tabs tablet Take 3 mg by mouth at  bedtime.   metoprolol  tartrate 12.5 mg Tabs tablet Commonly known as: LOPRESSOR  Take 12.5 mg by mouth 2 (two) times daily.   metoprolol  tartrate 25 MG tablet Commonly known as: LOPRESSOR  Take 1 tablet (25 mg total) by mouth 2 (two) times daily.   midodrine  5 MG tablet Commonly known as: PROAMATINE  Take 1 tablet (5 mg total) by mouth 3 (three) times daily with meals.   montelukast  10 MG tablet Commonly known as: SINGULAIR  TAKE 1 TABLET(10 MG) BY MOUTH DAILY   ondansetron  4 MG disintegrating tablet Commonly known as: ZOFRAN -ODT Take 1 tablet (4 mg total) by mouth every 8 (eight) hours as needed for vomiting or nausea.   OXYGEN  Inhale into the lungs. O2 2L, Monitor O2 sat Qshift to maintain O2 Sat >95%  every shift   pantoprazole  40 MG tablet Commonly known as: PROTONIX  TAKE 30 TO 60 MINUTES BEFORE THE FIRST AND LAST MEAL OF THE DAY   Potassium 99 MG Tabs Take 99 mg by mouth at bedtime.   predniSONE  5 MG tablet Commonly known as: DELTASONE  Take  6 Pills PO for 4 days, 4 Pills PO for 4 days, 2 Pills PO for 4 days, 1 Pills PO for 4 days, 1/2 Pill  PO for 4 days then STOP.   rosuvastatin  10 MG tablet Commonly known as: CRESTOR  Take 1 tablet (10 mg total) by mouth every evening.   TURMERIC CURCUMIN PO Take 1 tablet by mouth daily.   valACYclovir 1000 MG tablet Commonly known as: VALTREX Take 1,000 mg by mouth See admin instructions. Take 1 tablet (1000mg ) twice daily as needed for cold sores - continue until cold sores resolve.        Review of Systems  Constitutional:  Positive for fatigue. Negative for appetite change, diaphoresis and fever.  HENT:  Negative for congestion, trouble swallowing and voice change.   Eyes:  Negative for visual disturbance.  Respiratory:  Positive for cough and shortness of breath. Negative for chest tightness and wheezing.   Gastrointestinal:  Negative for abdominal pain, constipation, nausea and vomiting.  Genitourinary:  Negative for  dysuria, frequency and urgency.  Musculoskeletal:  Positive for arthralgias and gait problem.  Skin:  Negative for color change.  Neurological:  Negative for speech difficulty, weakness and headaches.  Psychiatric/Behavioral:  Negative for behavioral problems and sleep disturbance. The patient is not nervous/anxious.     Immunization History  Administered Date(s) Administered   Influenza Split 02/06/2011, 02/24/2011, 03/05/2013, 04/06/2014   Influenza, High Dose Seasonal PF 03/08/2021   Influenza,inj,Quad PF,6+ Mos 04/24/2015   Influenza,inj,quad, With Preservative 04/27/2017   Influenza-Unspecified 02/22/2011, 02/19/2012, 05/13/2016, 05/02/2017, 05/22/2018, 03/11/2019, 03/03/2020, 03/04/2021, 04/11/2022   PFIZER Comirnaty(Gray Top)Covid-19 Tri-Sucrose Vaccine 02/05/2023   PFIZER(Purple Top)SARS-COV-2 Vaccination 06/23/2019, 07/13/2019, 03/08/2020   PNEUMOCOCCAL CONJUGATE-20 09/24/2021   Pneumococcal Conjugate-13 11/15/2013   Pneumococcal Polysaccharide-23 12/25/2014   Tdap 04/11/2011, 09/24/2021, 06/25/2023   Unspecified SARS-COV-2 Vaccination 05/21/2021   Zoster Recombinant(Shingrix) 09/28/2017, 02/01/2018   Zoster, Live 05/07/2012, 09/28/2017, 02/01/2018   Pertinent  Health Maintenance Due  Topic Date Due   INFLUENZA VACCINE  01/05/2024   MAMMOGRAM  06/13/2025   Colonoscopy  03/22/2026   DEXA SCAN  Completed      01/10/2019   11:33 AM 01/10/2019    6:58 PM 01/10/2019    8:45 PM 01/11/2019    2:25 PM 03/02/2021    7:27 AM  Fall Risk  Falls in the past year?     1  Was there an injury with Fall?     1  Was there an injury with Fall? - Comments     bruises  Fall Risk Category Calculator     3  Fall Risk Category (Retired)     High  (RETIRED) Patient Fall Risk Level Moderate fall risk High fall risk Moderate fall risk Low fall risk    Functional Status Survey:    Vitals:   10/23/23 1202  BP: (!) 119/58  Pulse: 90  Resp: 16  Temp: 97.6 F (36.4 C)  SpO2: 97%  Weight: 142  lb 9.6 oz (64.7 kg)   Body mass index is 23.73 kg/m. Physical Exam Vitals and nursing note reviewed.  Constitutional:      Appearance: Normal appearance.  HENT:     Head: Normocephalic and atraumatic.  Nose: Nose normal.     Mouth/Throat:     Mouth: Mucous membranes are moist.  Eyes:     Extraocular Movements: Extraocular movements intact.     Conjunctiva/sclera: Conjunctivae normal.     Pupils: Pupils are equal, round, and reactive to light.  Cardiovascular:     Rate and Rhythm: Normal rate and regular rhythm.     Heart sounds: No murmur heard. Pulmonary:     Effort: Pulmonary effort is normal.     Breath sounds: No wheezing, rhonchi or rales.  Abdominal:     General: Bowel sounds are normal.     Palpations: Abdomen is soft.     Tenderness: There is no abdominal tenderness.  Musculoskeletal:        General: Normal range of motion.     Cervical back: Normal range of motion and neck supple.     Right lower leg: No edema.     Left lower leg: No edema.  Skin:    General: Skin is warm and dry.     Findings: No rash.  Neurological:     General: No focal deficit present.     Mental Status: She is alert and oriented to person, place, and time. Mental status is at baseline.     Motor: No weakness.     Coordination: Coordination normal.     Gait: Gait abnormal.     Comments: R BKA  Psychiatric:        Mood and Affect: Mood normal.        Behavior: Behavior normal.        Thought Content: Thought content normal.        Judgment: Judgment normal.     Labs reviewed: Recent Labs    09/03/23 0242 09/04/23 0357 09/05/23 0330 09/06/23 0404 09/11/23 0541 09/23/23 0438 09/25/23 0436 09/28/23 0450  NA  --  138 140 139   < > 136 137 135  K  --  3.2* 3.2* 3.7   < > 4.0 4.2 3.9  CL  --  101 104 105   < > 96* 99 96*  CO2  --  26 27 22    < > 29 32 28  GLUCOSE  --  150* 117* 95   < > 84 81 92  BUN  --  12 15 16    < > 16 14 20   CREATININE  --  0.68 0.66 0.73   < > 0.57  0.62 0.69  CALCIUM   --  7.6* 7.8* 7.9*   < > 8.5* 8.9 8.9  MG 1.8 2.0 2.2 1.8  --   --  2.0 1.9  PHOS 2.7 2.9 2.3*  --   --   --   --   --    < > = values in this interval not displayed.   Recent Labs    09/03/23 0200 09/05/23 0330 09/06/23 0404 09/11/23 0541  AST 53*  --  42* 29  ALT 46*  --  63* 35  ALKPHOS 64  --  57 71  BILITOT 1.2  --  0.7 0.9  PROT 5.9*  --  5.4* 6.0*  ALBUMIN  2.5* 2.1* 2.2* 2.4*   Recent Labs    09/11/23 0541 09/11/23 0550 09/23/23 0438 09/25/23 0436 09/28/23 0450  WBC 18.2*   < > 12.9* 13.2* 16.1*  NEUTROABS 16.0*  --   --  9.3* 12.6*  HGB 10.7*   < > 9.6* 10.1* 11.0*  HCT 34.5*   < > 29.7*  31.4* 33.4*  MCV 100.3*   < > 96.4 96.3 95.4  PLT 297   < > 310 314 322   < > = values in this interval not displayed.   Lab Results  Component Value Date   TSH 1.153 08/30/2023   No results found for: "HGBA1C" No results found for: "CHOL", "HDL", "LDLCALC", "LDLDIRECT", "TRIG", "CHOLHDL"  Significant Diagnostic Results in last 30 days:  DG Chest Port 1 View Result Date: 09/27/2023 CLINICAL DATA:  Shortness of breath EXAM: PORTABLE CHEST 1 VIEW COMPARISON:  09/24/2023 FINDINGS: Cardiac shadow is stable. Spinal stimulator is again noted. Bibasilar airspace opacities are again seen and stable. No new focal infiltrate is noted. No effusion is seen. Bony structures appear within normal limits. IMPRESSION: Stable basilar airspace opacities Electronically Signed   By: Violeta Grey M.D.   On: 09/27/2023 11:21   DG Chest Port 1 View Result Date: 09/24/2023 CLINICAL DATA:  Shortness of breath EXAM: PORTABLE CHEST 1 VIEW COMPARISON:  09/17/2023 FINDINGS: Lungs are hypoinflated. Stable cardiomediastinal contours. Unchanged bilateral lower lung zone reticular interstitial opacities. Compared with the previous exam there has been no significant change in aeration to lungs. Visualized osseous structures appear unremarkable. IMPRESSION: 1. Persistent bilateral reticular  interstitial opacities with a lower lung zone predominance. No new findings. Electronically Signed   By: Kimberley Penman M.D.   On: 09/24/2023 08:39    Assessment/Plan  Diabetes due to underlying condition w oth circulatory comp (HCC) S/p R BKA, obtain Hgb A1c, TSH, lipids, CBC/diff, CMP/eGFR  GERD (gastroesophageal reflux disease) Stable, on Pantoprazole   Anxiety and depression Stable,  on Fluoxetine .   Alzheimer's dementia (HCC) taking Donepezil .   Slow transit constipation Stable, on MiraLax   Chronic anemia  Vit B12 1285 09/12/23, Iron 42 09/15/23, Hgb 10.0  5.13.25  Essential hypertension on midodrine , off Metoprolol , Bun/creat 16/0.49 10/17/23  Acute pulmonary embolism (HCC)  on Eliquis   Acute respiratory failure with hypoxia (HCC) Acute respiratory failure with hypoxia, resolved an episode of SOB, O2 desaturation, HR 120s after O2 [place] and Neb/HFA used, followed by Pulmonology   Family/ staff Communication: plan of care reviewed with the patient and charge nurse.   Labs/tests ordered:  CXR ap/lateral. CBC/diff, CMP/eGFR, lipids, Hgb A1c, TSH in am

## 2023-10-25 ENCOUNTER — Encounter: Payer: Self-pay | Admitting: Adult Health

## 2023-10-25 ENCOUNTER — Non-Acute Institutional Stay (SKILLED_NURSING_FACILITY): Payer: Self-pay | Admitting: Adult Health

## 2023-10-25 DIAGNOSIS — J9601 Acute respiratory failure with hypoxia: Secondary | ICD-10-CM

## 2023-10-25 DIAGNOSIS — G309 Alzheimer's disease, unspecified: Secondary | ICD-10-CM

## 2023-10-25 DIAGNOSIS — F32A Depression, unspecified: Secondary | ICD-10-CM

## 2023-10-25 DIAGNOSIS — F419 Anxiety disorder, unspecified: Secondary | ICD-10-CM | POA: Diagnosis not present

## 2023-10-25 DIAGNOSIS — E785 Hyperlipidemia, unspecified: Secondary | ICD-10-CM

## 2023-10-25 DIAGNOSIS — M810 Age-related osteoporosis without current pathological fracture: Secondary | ICD-10-CM

## 2023-10-25 DIAGNOSIS — J4540 Moderate persistent asthma, uncomplicated: Secondary | ICD-10-CM

## 2023-10-25 DIAGNOSIS — K219 Gastro-esophageal reflux disease without esophagitis: Secondary | ICD-10-CM

## 2023-10-25 DIAGNOSIS — I5032 Chronic diastolic (congestive) heart failure: Secondary | ICD-10-CM

## 2023-10-25 DIAGNOSIS — I2699 Other pulmonary embolism without acute cor pulmonale: Secondary | ICD-10-CM

## 2023-10-25 DIAGNOSIS — F0280 Dementia in other diseases classified elsewhere without behavioral disturbance: Secondary | ICD-10-CM | POA: Diagnosis not present

## 2023-10-25 NOTE — Progress Notes (Signed)
 Location:  [facility] Room Number: 20-A Place of Service:  SNF (31)  Provider: Duncan Gibson, NP  PCP: Faustina Hood, MD Patient Care Team: Faustina Hood, MD as PCP - General (Family Medicine) Wenona Hamilton, MD as PCP - Cardiology (Cardiology) Adelaide Adjutant, MD as Consulting Physician (Physical Medicine and Rehabilitation)  Extended Emergency Contact Information Primary Emergency Contact: May,Brian Address: [address]          [place], [place] [address] [place] Home Phone: [phone] Work Phone: [phone] Mobile Phone: [phone] Relation: Brother Secondary Emergency Contact: graham,marisa Mobile Phone: [phone] Relation: Daughter  Code Status: Full Code Goals of care:  Advanced Directive information    10/25/2023   10:38 AM  Advanced Directives  Does Patient Have a Medical Advance Directive? No  Would patient like information on creating a medical advance directive? No - Patient declined     Allergies  Allergen Reactions   Delsym [Dextromethorphan] Other (See Comments)    Cognitive impairment    Chief Complaint  Patient presents with   Discharge to ALS    HPI:  She is a 73 y.o. female who is for discharge to [facility] on 10/30/2023. She has completed short-term rehabilitation as SNF and will continue to have PT, OT and ST when discharged to ALF.  She was hospitalized on 09/11/2023 to 09/30/2023 for acute hypoxic respiratory failure secondary to recent rhinovirus/strep pneumoniae -possible interstitial lung disease related to recent pneumonia and  small PE.  Of note she was hospitalized from 3/26 to 09/06/2023 for acute hypoxic respiratory failure secondary to rhinovirus strep pneumonia infection.  She was stabilized and discharged on home oxygen .  She came back to ED on 09/11/2023 with worsening shortness of breath with severe hypoxia.  CTA chest showed a small PE for which she was started  on heated high flow, given IV Lasix , started on Prednisone  then switched to Solu-Medrol .  Oxygen  use was eventually down to 2 to 3 L and upon ambulation pulse ox drops to 86% and she requires 3 to 4 L of nasal cannula oxygen .      Past Medical History:  Diagnosis Date   Anxiety    Arthritis    Asthma    Avascular necrosis of talus (HCC)    Cancer (HCC)    vulva pre cancer had surgery   Depression    Gait abnormality 03/20/2017   GERD (gastroesophageal reflux disease)    Hearing loss    "very minor"   History of pneumonia    Hyperlipidemia    Hypertension    Leg edema    left   Memory difficulty 03/20/2017   PONV (postoperative nausea and vomiting)    Stress incontinence     Past Surgical History:  Procedure Laterality Date   ANKLE FUSION  2011   right multiple    ANKLE FUSION     rear ankle fusion   ANKLE SURGERY  2010   right cordicompression   AORTIC ARCH ANGIOGRAPHY N/A 12/11/2017   Procedure: AORTIC ARCH ANGIOGRAPHY;  Surgeon: Richrd Char, MD;  Location: [facility] INVASIVE CV LAB;  Service: Cardiovascular;  Laterality: N/A;   APPENDECTOMY  05/10/2016   BELOW KNEE LEG AMPUTATION     CATARACT EXTRACTION W/ INTRAOCULAR LENS IMPLANT Left    LAPAROSCOPIC APPENDECTOMY N/A 05/10/2016   Procedure: APPENDECTOMY LAPAROSCOPIC;  Surgeon: Oza Blumenthal, MD;  Location: [facility] OR;  Service: General;  Laterality: N/A;   LUMBAR LAMINECTOMY/DECOMPRESSION MICRODISCECTOMY N/A 09/09/2015  Procedure:  L4-S1 Decompression/ Discetomy;  Surgeon: Mort Ards, MD;  Location: [facility] OR;  Service: Orthopedics;  Laterality: N/A;   SKIN GRAFT     SPINAL CORD STIMULATOR BATTERY EXCHANGE  06/23/2023   SPINAL CORD STIMULATOR INSERTION N/A 01/10/2019   Procedure: LUMBAR SPINAL CORD STIMULATOR INSERTION;  Surgeon: Mort Ards, MD;  Location: [facility] OR;  Service: Orthopedics;  Laterality: N/A;  2.5 hrs   TONSILLECTOMY     TUBAL LIGATION  1983   VULVECTOMY N/A 01/28/2015   Procedure: WIDE EXCISION  VULVECTOMY;  Surgeon: Johnn Najjar, MD;  Location: [facility] ORS;  Service: Gynecology;  Laterality: N/A;      reports that she quit smoking about 16 years ago. Her smoking use included cigarettes. She started smoking about 56 years ago. She has a 40 pack-year smoking history. She has never used smokeless tobacco. She reports current alcohol  use. She reports that she does not use drugs. Social History   Socioeconomic History   Marital status: Divorced    Spouse name: Not on file   Number of children: 3   Years of education: 14   Highest education level: Not on file  Occupational History   Occupation: ACCOUNTING    Employer: [employer]  Tobacco Use   Smoking status: Former    Current packs/day: 0.00    Average packs/day: 1 pack/day for 40.0 years (40.0 ttl pk-yrs)    Types: Cigarettes    Start date: 02/05/1967    Quit date: 02/05/2007    Years since quitting: 16.7   Smokeless tobacco: Never  Vaping Use   Vaping status: Never Used  Substance and Sexual Activity   Alcohol  use: Yes    Alcohol /week: 0.0 standard drinks of alcohol     Comment: socially   Drug use: No   Sexual activity: Not on file  Other Topics Concern   Not on file  Social History Narrative   Lives alone   Caffeine use: Tea every morning    Right handed    Social Drivers of Health   Financial Resource Strain: Not on file  Food Insecurity: No Food Insecurity (09/11/2023)   Hunger Vital Sign    Worried About Running Out of Food in the Last Year: Never true    Ran Out of Food in the Last Year: Never true  Transportation Needs: No Transportation Needs (09/11/2023)   PRAPARE - Administrator, Civil Service (Medical): No    Lack of Transportation (Non-Medical): No  Physical Activity: Not on file  Stress: Not on file  Social Connections: Moderately Isolated (09/11/2023)   Social Connection and Isolation Panel [NHANES]    Frequency of Communication with Friends and Family: More than three times a week     Frequency of Social Gatherings with Friends and Family: Once a week    Attends Religious Services: Never    Database administrator or Organizations: No    Attends Banker Meetings: Never    Marital Status: Married  Catering manager Violence: Not At Risk (09/11/2023)   Humiliation, Afraid, Rape, and Kick questionnaire    Fear of Current or Ex-Partner: No    Emotionally Abused: No    Physically Abused: No    Sexually Abused: No   Functional Status Survey:    Allergies  Allergen Reactions   Delsym [Dextromethorphan] Other (See Comments)    Cognitive impairment    Pertinent  Health Maintenance Due  Topic Date Due   HEMOGLOBIN A1C  Never done  FOOT EXAM  Never done   OPHTHALMOLOGY EXAM  Never done   INFLUENZA VACCINE  01/05/2024   MAMMOGRAM  06/13/2025   Colonoscopy  03/22/2026   DEXA SCAN  Completed    Medications: Outpatient Encounter Medications as of 10/25/2023  Medication Sig   acyclovir cream (ZOVIRAX) 5 % Apply 1 Application topically 2 (two) times daily as needed (cold sore).   albuterol  (PROAIR  HFA) 108 (90 Base) MCG/ACT inhaler 2 puffs every 4 hours as needed only  if your can't catch your breath   apixaban  (ELIQUIS ) 5 MG TABS tablet Take 1 tablet (5 mg total) by mouth 2 (two) times daily.   benzonatate  (TESSALON ) 200 MG capsule Take 1 capsule (200 mg total) by mouth 3 (three) times daily as needed for cough. TAKE 1 CAPSULE(200 MG) BY MOUTH THREE TIMES DAILY AS NEEDED FOR COUGH   budesonide -formoterol  (SYMBICORT ) 160-4.5 MCG/ACT inhaler Inhale 2 puffs into the lungs in the morning and at bedtime.   buPROPion  (WELLBUTRIN  XL) 300 MG 24 hr tablet Take 300 mg by mouth daily.   calcium  carbonate (OSCAL) 1500 (600 Ca) MG TABS tablet Take 600 mg of elemental calcium  by mouth at bedtime.   denosumab  (PROLIA ) 60 MG/ML SOLN injection Inject 60 mg into the skin every 6 (six) months.    donepezil  (ARICEPT ) 10 MG tablet Take 1 tablet (10 mg total) by mouth at bedtime.    FLUoxetine  (PROZAC ) 20 MG capsule Take 20 mg by mouth daily.   ipratropium-albuterol  (DUONEB) 0.5-2.5 (3) MG/3ML SOLN Take 3 mLs by nebulization every 6 (six) hours as needed (Shortness of breath, cough and/or wheeze). Use this or albuterol  inhaler but not both.   lactose free nutrition (BOOST) LIQD Take 237 mLs by mouth daily. Prefers vanilla   MAGNESIUM  GLYCINATE PO Take 1 tablet by mouth at bedtime. 200 mg   melatonin 3 MG TABS tablet Take 3 mg by mouth at bedtime.   metoprolol  tartrate (LOPRESSOR ) 12.5 mg TABS tablet Take 12.5 mg by mouth 2 (two) times daily.   metoprolol  tartrate (LOPRESSOR ) 25 MG tablet Take 1 tablet (25 mg total) by mouth 2 (two) times daily.   midodrine  (PROAMATINE ) 5 MG tablet Take 1 tablet (5 mg total) by mouth 3 (three) times daily with meals.   montelukast  (SINGULAIR ) 10 MG tablet TAKE 1 TABLET(10 MG) BY MOUTH DAILY   Multiple Vitamins-Minerals (CENTRUM SILVER WOMEN 50+) TABS Take 1 tablet by mouth daily.   ondansetron  (ZOFRAN -ODT) 4 MG disintegrating tablet Take 1 tablet (4 mg total) by mouth every 8 (eight) hours as needed for vomiting or nausea.   OXYGEN  Inhale into the lungs. O2 2L, Monitor O2 sat Qshift to maintain O2 Sat >95% every shift   pantoprazole  (PROTONIX ) 40 MG tablet TAKE 30 TO 60 MINUTES BEFORE THE FIRST AND LAST MEAL OF THE DAY   polyethylene glycol (MIRALAX  / GLYCOLAX ) 17 g packet Take 17 g by mouth daily. 1 scoop every 24 hours as needed for constipation   Potassium 99 MG TABS Take 99 mg by mouth at bedtime.   predniSONE  (DELTASONE ) 5 MG tablet Take  6 Pills PO for 4 days, 4 Pills PO for 4 days, 2 Pills PO for 4 days, 1 Pills PO for 4 days, 1/2 Pill  PO for 4 days then STOP.   rosuvastatin  (CRESTOR ) 10 MG tablet Take 1 tablet (10 mg total) by mouth every evening.   TURMERIC CURCUMIN PO Take 1 tablet by mouth daily.   valACYclovir (VALTREX) 1000 MG tablet Take  1,000 mg by mouth See admin instructions. Take 1 tablet (1000mg ) twice daily as needed for  cold sores - continue until cold sores resolve.   No facility-administered encounter medications on file as of 10/25/2023.    Review of Systems  Constitutional:  Negative for appetite change, chills, fatigue and fever.  HENT:  Negative for congestion, hearing loss, rhinorrhea and sore throat.   Eyes: Negative.   Respiratory:  Positive for shortness of breath. Negative for cough and wheezing.   Cardiovascular:  Negative for chest pain, palpitations and leg swelling.  Gastrointestinal:  Negative for abdominal pain, constipation, diarrhea, nausea and vomiting.  Genitourinary:  Negative for dysuria.  Musculoskeletal:  Negative for arthralgias, back pain and myalgias.  Skin:  Negative for color change, rash and wound.  Neurological:  Negative for dizziness, weakness and headaches.  Psychiatric/Behavioral:  Negative for behavioral problems. The patient is not nervous/anxious.     Vitals:   10/25/23 1013  BP: 118/63  Pulse: 86  Resp: 16  Temp: 97.6 F (36.4 C)  SpO2: 98%  Weight: 142 lb 4.8 oz (64.5 kg)  Height: 5\' 5"  (1.651 m)   Body mass index is 23.68 kg/m. Physical Exam Constitutional:      Appearance: Normal appearance.  HENT:     Head: Normocephalic and atraumatic.     Nose: Nose normal.     Mouth/Throat:     Mouth: Mucous membranes are moist.  Eyes:     Conjunctiva/sclera: Conjunctivae normal.  Cardiovascular:     Rate and Rhythm: Normal rate and regular rhythm.  Pulmonary:     Effort: Pulmonary effort is normal.     Breath sounds: Normal breath sounds.  Abdominal:     General: Bowel sounds are normal.     Palpations: Abdomen is soft.  Musculoskeletal:     Cervical back: Normal range of motion.     Comments: Right BKA with prosthesis  Skin:    General: Skin is warm and dry.  Neurological:     Mental Status: She is alert.  Psychiatric:        Mood and Affect: Mood normal.        Behavior: Behavior normal.     Labs reviewed: Basic Metabolic Panel: Recent  Labs    09/03/23 0242 09/04/23 0357 09/05/23 0330 09/06/23 0404 09/11/23 0541 09/23/23 0438 09/25/23 0436 09/28/23 0450 10/10/23 0000  NA  --  138 140 139   < > 136 137 135 140  K  --  3.2* 3.2* 3.7   < > 4.0 4.2 3.9 4.3  CL  --  101 104 105   < > 96* 99 96* 103  CO2  --  26 27 22    < > 29 32 28 31*  GLUCOSE  --  150* 117* 95   < > 84 81 92  --   BUN  --  12 15 16    < > 16 14 20  33*  CREATININE  --  0.68 0.66 0.73   < > 0.57 0.62 0.69 0.5  CALCIUM   --  7.6* 7.8* 7.9*   < > 8.5* 8.9 8.9 8.3*  MG 1.8 2.0 2.2 1.8  --   --  2.0 1.9  --   PHOS 2.7 2.9 2.3*  --   --   --   --   --   --    < > = values in this interval not displayed.   Liver Function Tests: Recent Labs    09/03/23 0200  09/05/23 0330 09/06/23 0404 09/11/23 0541  AST 53*  --  42* 29  ALT 46*  --  63* 35  ALKPHOS 64  --  57 71  BILITOT 1.2  --  0.7 0.9  PROT 5.9*  --  5.4* 6.0*  ALBUMIN  2.5* 2.1* 2.2* 2.4*   Recent Labs    04/01/23 1847  LIPASE 14   No results for input(s): "AMMONIA" in the last 8760 hours. CBC: Recent Labs    09/11/23 0541 09/11/23 0550 09/23/23 0438 09/25/23 0436 09/28/23 0450 10/10/23 0000  WBC 18.2*   < > 12.9* 13.2* 16.1* 8.9  NEUTROABS 16.0*  --   --  9.3* 12.6*  --   HGB 10.7*   < > 9.6* 10.1* 11.0* 10.0*  HCT 34.5*   < > 29.7* 31.4* 33.4* 31*  MCV 100.3*   < > 96.4 96.3 95.4  --   PLT 297   < > 310 314 322 234   < > = values in this interval not displayed.   Cardiac Enzymes: No results for input(s): "CKTOTAL", "CKMB", "CKMBINDEX", "TROPONINI" in the last 8760 hours. BNP: Invalid input(s): "POCBNP" CBG: Recent Labs    09/29/23 0830 09/29/23 1315 09/29/23 1623  GLUCAP 89 179* 208*    Procedures and Imaging Studies During Stay: [facility] Chest Port 1 View Result Date: 09/27/2023 CLINICAL DATA:  Shortness of breath EXAM: PORTABLE CHEST 1 VIEW COMPARISON:  09/24/2023 FINDINGS: Cardiac shadow is stable. Spinal stimulator is again noted. Bibasilar airspace opacities are  again seen and stable. No new focal infiltrate is noted. No effusion is seen. Bony structures appear within normal limits. IMPRESSION: Stable basilar airspace opacities Electronically Signed   By: Violeta Grey M.D.   On: 09/27/2023 11:21    Assessment/Plan:    1. Acute respiratory failure with hypoxia (HCC) (Primary) - Secondary to recent rhinovirus/strep pneumoniae-possible interstitial lung disease related to recent pneumonia and small PE - Continue O2 2 to 3 L/min via [place] continuously -   Encourage use of incentive spirometer and flutter valve every 30 minutes while awake  2. Acute pulmonary embolism, unspecified pulmonary embolism type, unspecified whether acute cor pulmonale present (HCC) -Continue Eliquis  5 mg twice a day  3. Chronic heart failure with preserved ejection fraction (HCC) -Euvolemic, stable  4. Hyperlipidemia, unspecified hyperlipidemia type -Continue rosuvastatin  10 mg at bedtime  5. Anxiety and depression -PHQ-9 score 3, ranging as minimal depression -   Continue Wellbutrin  ER 300 mg daily and fluoxetine  20 mg daily  6. Alzheimer's dementia, unspecified dementia severity, unspecified timing of dementia onset, unspecified whether behavioral, psychotic, or mood disturbance or anxiety (HCC) -  BIMS score 15 -   Continue donepezil  10 mg at bedtime  7. Gastroesophageal reflux disease, unspecified whether esophagitis present -   Continue pantoprazole  40 mg twice daily  8. Moderate persistent asthma, unspecified whether complicated -Continue Singulair  10 mg daily, ipratropium-albuterol  inhalation every 6 hours as needed, budesonide -formoterol  inhalation 2 puffs orally twice daily, albuterol  HFA inhaler 2 puffs every 4 hours as needed  9. Age related osteoporosis, unspecified pathological fracture presence -Continue Prolia  60 mg SQ every 6 months - Fall precautions      Patient is being discharged t ALF and will continue to have PT, OT and ST.    Patient is being  discharged with the following durable medical equipment:  will continue to use O2 @ 2-3L/min via [place] continuously.    Future labs/tests needed:   None

## 2023-10-25 NOTE — Progress Notes (Deleted)
 Location:  [facility] Room Number: 20-A Place of Service:  SNF (31) Provider:  Medina-Vargas, Suman Trivedi, DNP, FNP-BC  Patient Care Team: Faustina Hood, MD as PCP - General (Family Medicine) Wenona Hamilton, MD as PCP - Cardiology (Cardiology) Adelaide Adjutant, MD as Consulting Physician (Physical Medicine and Rehabilitation)  Extended Emergency Contact Information Primary Emergency Contact: May,Brian Address: [address]          [place], [place] [address] [place] Home Phone: [phone] Work Phone: [phone] Mobile Phone: [phone] Relation: Brother Secondary Emergency Contact: graham,marisa Mobile Phone: [phone] Relation: Daughter  Code Status:   Full Code  Goals of care: Advanced Directive information    09/11/2023   10:50 AM  Advanced Directives  Does Patient Have a Medical Advance Directive? Yes  Type of Advance Directive Healthcare Power of Attorney  Does patient want to make changes to medical advance directive? Yes (Inpatient - patient defers changing a medical advance directive and declines information at this time)  Copy of Healthcare Power of Attorney in Chart? No - copy requested     Chief Complaint  Patient presents with   Discharge to ALS    HPI:  Pt is a 73 y.o. female seen today for for a discharge visit. She will discharge to [facility] on 5/26   Past Medical History:  Diagnosis Date   Anxiety    Arthritis    Asthma    Avascular necrosis of talus (HCC)    Cancer (HCC)    vulva pre cancer had surgery   Depression    Gait abnormality 03/20/2017   GERD (gastroesophageal reflux disease)    Hearing loss    "very minor"   History of pneumonia    Hyperlipidemia    Hypertension    Leg edema    left   Memory difficulty 03/20/2017   PONV (postoperative nausea and vomiting)    Stress incontinence    Past Surgical History:  Procedure Laterality Date   ANKLE FUSION  2011    right multiple    ANKLE FUSION     rear ankle fusion   ANKLE SURGERY  2010   right cordicompression   AORTIC ARCH ANGIOGRAPHY N/A 12/11/2017   Procedure: AORTIC ARCH ANGIOGRAPHY;  Surgeon: Richrd Char, MD;  Location: [facility] INVASIVE CV LAB;  Service: Cardiovascular;  Laterality: N/A;   APPENDECTOMY  05/10/2016   BELOW KNEE LEG AMPUTATION     CATARACT EXTRACTION W/ INTRAOCULAR LENS IMPLANT Left    LAPAROSCOPIC APPENDECTOMY N/A 05/10/2016   Procedure: APPENDECTOMY LAPAROSCOPIC;  Surgeon: Oza Blumenthal, MD;  Location: [facility] OR;  Service: General;  Laterality: N/A;   LUMBAR LAMINECTOMY/DECOMPRESSION MICRODISCECTOMY N/A 09/09/2015   Procedure:  L4-S1 Decompression/ Discetomy;  Surgeon: Mort Ards, MD;  Location: [facility] OR;  Service: Orthopedics;  Laterality: N/A;   SKIN GRAFT     SPINAL CORD STIMULATOR BATTERY EXCHANGE  06/23/2023   SPINAL CORD STIMULATOR INSERTION N/A 01/10/2019   Procedure: LUMBAR SPINAL CORD STIMULATOR INSERTION;  Surgeon: Mort Ards, MD;  Location: [facility] OR;  Service: Orthopedics;  Laterality: N/A;  2.5 hrs   TONSILLECTOMY     TUBAL LIGATION  1983   VULVECTOMY N/A 01/28/2015   Procedure: WIDE EXCISION VULVECTOMY;  Surgeon: Johnn Najjar, MD;  Location: [facility] ORS;  Service: Gynecology;  Laterality: N/A;    Allergies  Allergen Reactions   Delsym [Dextromethorphan] Other (See Comments)    Cognitive impairment    Outpatient Encounter Medications as of 10/25/2023  Medication Sig   acyclovir cream (ZOVIRAX) 5 % Apply 1 Application topically 2 (two) times daily as needed (cold sore).   albuterol  (PROAIR  HFA) 108 (90 Base) MCG/ACT inhaler 2 puffs every 4 hours as needed only  if your can't catch your breath   apixaban  (ELIQUIS ) 5 MG TABS tablet Take 1 tablet (5 mg total) by mouth 2 (two) times daily.   benzonatate  (TESSALON ) 200 MG capsule Take 1 capsule (200 mg total) by mouth 3 (three) times daily as needed for cough. TAKE 1 CAPSULE(200 MG) BY MOUTH THREE TIMES DAILY AS  NEEDED FOR COUGH   budesonide -formoterol  (SYMBICORT ) 160-4.5 MCG/ACT inhaler Inhale 2 puffs into the lungs in the morning and at bedtime.   buPROPion  (WELLBUTRIN  XL) 300 MG 24 hr tablet Take 300 mg by mouth daily.   calcium  carbonate (OSCAL) 1500 (600 Ca) MG TABS tablet Take 600 mg of elemental calcium  by mouth at bedtime.   denosumab  (PROLIA ) 60 MG/ML SOLN injection Inject 60 mg into the skin every 6 (six) months.    donepezil  (ARICEPT ) 10 MG tablet Take 1 tablet (10 mg total) by mouth at bedtime.   FLUoxetine  (PROZAC ) 20 MG capsule Take 20 mg by mouth daily.   ipratropium-albuterol  (DUONEB) 0.5-2.5 (3) MG/3ML SOLN Take 3 mLs by nebulization every 6 (six) hours as needed (Shortness of breath, cough and/or wheeze). Use this or albuterol  inhaler but not both.   lactose free nutrition (BOOST) LIQD Take 237 mLs by mouth daily. Prefers vanilla   MAGNESIUM  GLYCINATE PO Take 1 tablet by mouth at bedtime. 200 mg   melatonin 3 MG TABS tablet Take 3 mg by mouth at bedtime.   metoprolol  tartrate (LOPRESSOR ) 12.5 mg TABS tablet Take 12.5 mg by mouth 2 (two) times daily.   metoprolol  tartrate (LOPRESSOR ) 25 MG tablet Take 1 tablet (25 mg total) by mouth 2 (two) times daily. (Patient not taking: Reported on 10/09/2023)   midodrine  (PROAMATINE ) 5 MG tablet Take 1 tablet (5 mg total) by mouth 3 (three) times daily with meals.   montelukast  (SINGULAIR ) 10 MG tablet TAKE 1 TABLET(10 MG) BY MOUTH DAILY   Multiple Vitamins-Minerals (CENTRUM SILVER WOMEN 50+) TABS Take 1 tablet by mouth daily.   ondansetron  (ZOFRAN -ODT) 4 MG disintegrating tablet Take 1 tablet (4 mg total) by mouth every 8 (eight) hours as needed for vomiting or nausea.   OXYGEN  Inhale into the lungs. O2 2L, Monitor O2 sat Qshift to maintain O2 Sat >95% every shift   pantoprazole  (PROTONIX ) 40 MG tablet TAKE 30 TO 60 MINUTES BEFORE THE FIRST AND LAST MEAL OF THE DAY   Potassium 99 MG TABS Take 99 mg by mouth at bedtime.   predniSONE  (DELTASONE ) 5 MG  tablet Take  6 Pills PO for 4 days, 4 Pills PO for 4 days, 2 Pills PO for 4 days, 1 Pills PO for 4 days, 1/2 Pill  PO for 4 days then STOP.   rosuvastatin  (CRESTOR ) 10 MG tablet Take 1 tablet (10 mg total) by mouth every evening.   TURMERIC CURCUMIN PO Take 1 tablet by mouth daily.   valACYclovir (VALTREX) 1000 MG tablet Take 1,000 mg by mouth See admin instructions. Take 1 tablet (1000mg ) twice daily as needed for cold sores - continue until cold sores resolve.   No facility-administered encounter medications on file as of 10/25/2023.    Review of Systems  Constitutional:  Negative for appetite change, chills, fatigue and fever.  HENT:  Negative for congestion, hearing loss, rhinorrhea and sore  throat.   Eyes: Negative.   Respiratory:  Negative for cough, shortness of breath and wheezing.   Cardiovascular:  Negative for chest pain, palpitations and leg swelling.  Gastrointestinal:  Negative for abdominal pain, constipation, diarrhea, nausea and vomiting.  Genitourinary:  Negative for dysuria.  Musculoskeletal:  Negative for arthralgias, back pain and myalgias.  Skin:  Negative for color change, rash and wound.  Neurological:  Negative for dizziness, weakness and headaches.  Psychiatric/Behavioral:  Negative for behavioral problems. The patient is not nervous/anxious.    ***    Immunization History  Administered Date(s) Administered   Influenza Split 02/06/2011, 02/24/2011, 03/05/2013, 04/06/2014   Influenza, High Dose Seasonal PF 03/08/2021   Influenza,inj,Quad PF,6+ Mos 04/24/2015   Influenza,inj,quad, With Preservative 04/27/2017   Influenza-Unspecified 02/22/2011, 02/19/2012, 05/13/2016, 05/02/2017, 05/22/2018, 03/11/2019, 03/03/2020, 03/04/2021, 04/11/2022   PFIZER Comirnaty(Gray Top)Covid-19 Tri-Sucrose Vaccine 02/05/2023   PFIZER(Purple Top)SARS-COV-2 Vaccination 06/23/2019, 07/13/2019, 03/08/2020   PNEUMOCOCCAL CONJUGATE-20 09/24/2021   Pneumococcal Conjugate-13 11/15/2013    Pneumococcal Polysaccharide-23 12/25/2014   Tdap 04/11/2011, 09/24/2021, 06/25/2023   Unspecified SARS-COV-2 Vaccination 05/21/2021   Zoster Recombinant(Shingrix) 09/28/2017, 02/01/2018   Zoster, Live 05/07/2012, 09/28/2017, 02/01/2018   Pertinent  Health Maintenance Due  Topic Date Due   HEMOGLOBIN A1C  Never done   FOOT EXAM  Never done   OPHTHALMOLOGY EXAM  Never done   INFLUENZA VACCINE  01/05/2024   MAMMOGRAM  06/13/2025   Colonoscopy  03/22/2026   DEXA SCAN  Completed      01/10/2019   11:33 AM 01/10/2019    6:58 PM 01/10/2019    8:45 PM 01/11/2019    2:25 PM 03/02/2021    7:27 AM  Fall Risk  Falls in the past year?     1  Was there an injury with Fall?     1  Was there an injury with Fall? - Comments     bruises  Fall Risk Category Calculator     3  Fall Risk Category (Retired)     High  (RETIRED) Patient Fall Risk Level Moderate fall risk High fall risk Moderate fall risk Low fall risk      Vitals:   10/25/23 1013  BP: 118/63  Pulse: 86  Resp: 16  Temp: 97.6 F (36.4 C)  SpO2: 98%  Weight: 142 lb 4.8 oz (64.5 kg)  Height: 5\' 5"  (1.651 m)   Body mass index is 23.68 kg/m.  Physical Exam Constitutional:      Appearance: Normal appearance.  HENT:     Head: Normocephalic and atraumatic.     Nose: Nose normal.     Mouth/Throat:     Mouth: Mucous membranes are moist.  Eyes:     Conjunctiva/sclera: Conjunctivae normal.  Cardiovascular:     Rate and Rhythm: Normal rate and regular rhythm.  Pulmonary:     Effort: Pulmonary effort is normal.     Breath sounds: Normal breath sounds.  Abdominal:     General: Bowel sounds are normal.     Palpations: Abdomen is soft.  Musculoskeletal:        General: Normal range of motion.     Cervical back: Normal range of motion.  Skin:    General: Skin is warm and dry.  Neurological:     General: No focal deficit present.     Mental Status: She is alert and oriented to person, place, and time.  Psychiatric:        Mood  and Affect: Mood normal.  Behavior: Behavior normal.        Thought Content: Thought content normal.        Judgment: Judgment normal.        Labs reviewed: Recent Labs    09/03/23 0242 09/04/23 0357 09/05/23 0330 09/06/23 0404 09/11/23 0541 09/23/23 0438 09/25/23 0436 09/28/23 0450  NA  --  138 140 139   < > 136 137 135  K  --  3.2* 3.2* 3.7   < > 4.0 4.2 3.9  CL  --  101 104 105   < > 96* 99 96*  CO2  --  26 27 22    < > 29 32 28  GLUCOSE  --  150* 117* 95   < > 84 81 92  BUN  --  12 15 16    < > 16 14 20   CREATININE  --  0.68 0.66 0.73   < > 0.57 0.62 0.69  CALCIUM   --  7.6* 7.8* 7.9*   < > 8.5* 8.9 8.9  MG 1.8 2.0 2.2 1.8  --   --  2.0 1.9  PHOS 2.7 2.9 2.3*  --   --   --   --   --    < > = values in this interval not displayed.   Recent Labs    09/03/23 0200 09/05/23 0330 09/06/23 0404 09/11/23 0541  AST 53*  --  42* 29  ALT 46*  --  63* 35  ALKPHOS 64  --  57 71  BILITOT 1.2  --  0.7 0.9  PROT 5.9*  --  5.4* 6.0*  ALBUMIN  2.5* 2.1* 2.2* 2.4*   Recent Labs    09/11/23 0541 09/11/23 0550 09/23/23 0438 09/25/23 0436 09/28/23 0450  WBC 18.2*   < > 12.9* 13.2* 16.1*  NEUTROABS 16.0*  --   --  9.3* 12.6*  HGB 10.7*   < > 9.6* 10.1* 11.0*  HCT 34.5*   < > 29.7* 31.4* 33.4*  MCV 100.3*   < > 96.4 96.3 95.4  PLT 297   < > 310 314 322   < > = values in this interval not displayed.   Lab Results  Component Value Date   TSH 1.153 08/30/2023   No results found for: "HGBA1C" No results found for: "CHOL", "HDL", "LDLCALC", "LDLDIRECT", "TRIG", "CHOLHDL"  Significant Diagnostic Results in last 30 days:  DG Chest Port 1 View Result Date: 09/27/2023 CLINICAL DATA:  Shortness of breath EXAM: PORTABLE CHEST 1 VIEW COMPARISON:  09/24/2023 FINDINGS: Cardiac shadow is stable. Spinal stimulator is again noted. Bibasilar airspace opacities are again seen and stable. No new focal infiltrate is noted. No effusion is seen. Bony structures appear within normal  limits. IMPRESSION: Stable basilar airspace opacities Electronically Signed   By: Violeta Grey M.D.   On: 09/27/2023 11:21    Assessment/Plan ***   Family/ staff Communication: Discussed plan of care with resident and charge nurse  Labs/tests ordered:     Precilla Purnell Medina-Vargas, DNP, MSN, FNP-BC [facility] [phone] (Monday-Friday 8:00 a.m. - 5:00 p.m.) [phone] (after hours)

## 2023-10-26 ENCOUNTER — Encounter: Payer: Self-pay | Admitting: Sports Medicine

## 2023-10-26 LAB — HEMOGLOBIN A1C: Hemoglobin A1C: 5.8

## 2023-10-26 LAB — LIPID PANEL
Cholesterol: 139 (ref 0–200)
HDL: 56 (ref 35–70)
LDL Cholesterol: 69
Triglycerides: 62 (ref 40–160)

## 2023-10-26 LAB — BASIC METABOLIC PANEL WITH GFR
BUN: 15 (ref 4–21)
CO2: 31 — AB (ref 13–22)
Chloride: 103 (ref 99–108)
Creatinine: 0.6 (ref 0.5–1.1)
Glucose: 90
Potassium: 4.3 meq/L (ref 3.5–5.1)
Sodium: 140 (ref 137–147)

## 2023-10-26 LAB — HEPATIC FUNCTION PANEL
ALT: 15 U/L (ref 7–35)
AST: 19 (ref 13–35)

## 2023-10-26 LAB — CBC AND DIFFERENTIAL
HCT: 33 — AB (ref 36–46)
Hemoglobin: 10.1 — AB (ref 12.0–16.0)
Neutrophils Absolute: 3324
Platelets: 327 10*3/uL (ref 150–400)
WBC: 6

## 2023-10-26 LAB — COMPREHENSIVE METABOLIC PANEL WITH GFR
Albumin: 3.2 — AB (ref 3.5–5.0)
Calcium: 8.7 (ref 8.7–10.7)
Globulin: 2.4

## 2023-10-26 LAB — CBC: RBC: 3.39 — AB (ref 3.87–5.11)

## 2023-10-26 LAB — TSH: TSH: 0.74 (ref 0.41–5.90)

## 2023-10-26 NOTE — Progress Notes (Unsigned)
 Location:   [facility]  Nursing Home Room Number: 20-A Place of Service:  SNF (31)  Provider: Fredy Jericho  PCP: Faustina Hood, MD Patient Care Team: Faustina Hood, MD as PCP - General (Family Medicine) Wenona Hamilton, MD as PCP - Cardiology (Cardiology) Adelaide Adjutant, MD as Consulting Physician (Physical Medicine and Rehabilitation)  Extended Emergency Contact Information Primary Emergency Contact: May,Brian Address: [address]          [place], [place] [address] [place] Home Phone: [phone] Work Phone: [phone] Mobile Phone: [phone] Relation: Brother Secondary Emergency Contact: graham,marisa Mobile Phone: [phone] Relation: Daughter  Code Status: FULL CODE Goals of care:  Advanced Directive information    10/26/2023   10:16 AM  Advanced Directives  Does Patient Have a Medical Advance Directive? No  Would patient like information on creating a medical advance directive? No - Patient declined     Allergies  Allergen Reactions   Delsym [Dextromethorphan] Other (See Comments)    Cognitive impairment    Chief Complaint  Patient presents with   Discharge Note    HPI:  73 y.o. female      Past Medical History:  Diagnosis Date   Anxiety    Arthritis    Asthma    Avascular necrosis of talus (HCC)    Cancer (HCC)    vulva pre cancer had surgery   Depression    Gait abnormality 03/20/2017   GERD (gastroesophageal reflux disease)    Hearing loss    "very minor"   History of pneumonia    Hyperlipidemia    Hypertension    Leg edema    left   Memory difficulty 03/20/2017   PONV (postoperative nausea and vomiting)    Stress incontinence     Past Surgical History:  Procedure Laterality Date   ANKLE FUSION  2011   right multiple    ANKLE FUSION     rear ankle fusion   ANKLE SURGERY  2010   right cordicompression   AORTIC ARCH ANGIOGRAPHY N/A 12/11/2017   Procedure: AORTIC ARCH  ANGIOGRAPHY;  Surgeon: Richrd Char, MD;  Location: [facility];  Service: Cardiovascular;  Laterality: N/A;   APPENDECTOMY  05/10/2016   BELOW KNEE LEG AMPUTATION     CATARACT EXTRACTION W/ INTRAOCULAR LENS IMPLANT Left    LAPAROSCOPIC APPENDECTOMY N/A 05/10/2016   Procedure: APPENDECTOMY LAPAROSCOPIC;  Surgeon: Oza Blumenthal, MD;  Location: [facility] OR;  Service: General;  Laterality: N/A;   LUMBAR LAMINECTOMY/DECOMPRESSION MICRODISCECTOMY N/A 09/09/2015   Procedure:  L4-S1 Decompression/ Discetomy;  Surgeon: Mort Ards, MD;  Location: [facility] OR;  Service: Orthopedics;  Laterality: N/A;   SKIN GRAFT     SPINAL CORD STIMULATOR BATTERY EXCHANGE  06/23/2023   SPINAL CORD STIMULATOR INSERTION N/A 01/10/2019   Procedure: LUMBAR SPINAL CORD STIMULATOR INSERTION;  Surgeon: Mort Ards, MD;  Location: [facility] OR;  Service: Orthopedics;  Laterality: N/A;  2.5 hrs   TONSILLECTOMY     TUBAL LIGATION  1983   VULVECTOMY N/A 01/28/2015   Procedure: WIDE EXCISION VULVECTOMY;  Surgeon: Johnn Najjar, MD;  Location: [facility] ORS;  Service: Gynecology;  Laterality: N/A;      reports that she quit smoking about 16 years ago. Her smoking use included cigarettes. She started smoking about 56 years ago. She has a 40 pack-year smoking history. She has never used smokeless tobacco. She reports current alcohol  use. She reports that she does not use drugs. Social History   Socioeconomic History  Marital status: Divorced    Spouse name: Not on file   Number of children: 3   Years of education: 14   Highest education level: Not on file  Occupational History   Occupation: ACCOUNTING    Employer: [employer]  Tobacco Use   Smoking status: Former    Current packs/day: 0.00    Average packs/day: 1 pack/day for 40.0 years (40.0 ttl pk-yrs)    Types: Cigarettes    Start date: 02/05/1967    Quit date: 02/05/2007    Years since quitting: 16.7   Smokeless tobacco: Never  Vaping Use   Vaping status: Never Used   Substance and Sexual Activity   Alcohol  use: Yes    Alcohol /week: 0.0 standard drinks of alcohol     Comment: socially   Drug use: No   Sexual activity: Not on file  Other Topics Concern   Not on file  Social History Narrative   Lives alone   Caffeine use: Tea every morning    Right handed    Social Drivers of Health   Financial Resource Strain: Not on file  Food Insecurity: No Food Insecurity (09/11/2023)   Hunger Vital Sign    Worried About Running Out of Food in the Last Year: Never true    Ran Out of Food in the Last Year: Never true  Transportation Needs: No Transportation Needs (09/11/2023)   PRAPARE - Administrator, Civil Service (Medical): No    Lack of Transportation (Non-Medical): No  Physical Activity: Not on file  Stress: Not on file  Social Connections: Moderately Isolated (09/11/2023)   Social Connection and Isolation Panel [NHANES]    Frequency of Communication with Friends and Family: More than three times a week    Frequency of Social Gatherings with Friends and Family: Once a week    Attends Religious Services: Never    Database administrator or Organizations: No    Attends Banker Meetings: Never    Marital Status: Married  Catering manager Violence: Not At Risk (09/11/2023)   Humiliation, Afraid, Rape, and Kick questionnaire    Fear of Current or Ex-Partner: No    Emotionally Abused: No    Physically Abused: No    Sexually Abused: No   Functional Status Survey:    Allergies  Allergen Reactions   Delsym [Dextromethorphan] Other (See Comments)    Cognitive impairment    Pertinent  Health Maintenance Due  Topic Date Due   FOOT EXAM  Never done   OPHTHALMOLOGY EXAM  Never done   INFLUENZA VACCINE  01/05/2024   HEMOGLOBIN A1C  04/27/2024   MAMMOGRAM  06/13/2025   Colonoscopy  03/22/2026   DEXA SCAN  Completed    Medications: Allergies as of 10/26/2023       Reactions   Delsym [dextromethorphan] Other (See Comments)    Cognitive impairment        Medication List        Accurate as of Oct 26, 2023 10:17 AM. If you have any questions, ask your nurse or doctor.          acyclovir cream 5 % Commonly known as: ZOVIRAX Apply 1 Application topically 2 (two) times daily as needed (cold sore).   albuterol  108 (90 Base) MCG/ACT inhaler Commonly known as: ProAir  HFA 2 puffs every 4 hours as needed only  if your can't catch your breath   apixaban  5 MG Tabs tablet Commonly known as: ELIQUIS  Take 1 tablet (5 mg  total) by mouth 2 (two) times daily.   benzonatate  200 MG capsule Commonly known as: TESSALON  Take 1 capsule (200 mg total) by mouth 3 (three) times daily as needed for cough. TAKE 1 CAPSULE(200 MG) BY MOUTH THREE TIMES DAILY AS NEEDED FOR COUGH   budesonide -formoterol  160-4.5 MCG/ACT inhaler Commonly known as: Symbicort  Inhale 2 puffs into the lungs in the morning and at bedtime.   buPROPion  300 MG 24 hr tablet Commonly known as: WELLBUTRIN  XL Take 300 mg by mouth daily.   calcium  carbonate 1500 (600 Ca) MG Tabs tablet Commonly known as: OSCAL Take 600 mg of elemental calcium  by mouth at bedtime.   Centrum Silver Women 50+ Tabs Take 1 tablet by mouth daily.   denosumab  60 MG/ML Soln injection Commonly known as: PROLIA  Inject 60 mg into the skin every 6 (six) months.   donepezil  10 MG tablet Commonly known as: ARICEPT  Take 1 tablet (10 mg total) by mouth at bedtime.   FLUoxetine  20 MG capsule Commonly known as: PROZAC  Take 20 mg by mouth daily.   ipratropium-albuterol  0.5-2.5 (3) MG/3ML Soln Commonly known as: DUONEB Take 3 mLs by nebulization every 6 (six) hours as needed (Shortness of breath, cough and/or wheeze). Use this or albuterol  inhaler but not both.   lactose free nutrition Liqd Take 237 mLs by mouth daily. Prefers vanilla   MAGNESIUM  GLYCINATE PO Take 1 tablet by mouth at bedtime. 200 mg   melatonin 3 MG Tabs tablet Take 3 mg by mouth at bedtime.    metoprolol  tartrate 12.5 mg Tabs tablet Commonly known as: LOPRESSOR  Take 12.5 mg by mouth 2 (two) times daily.   metoprolol  tartrate 25 MG tablet Commonly known as: LOPRESSOR  Take 1 tablet (25 mg total) by mouth 2 (two) times daily.   midodrine  5 MG tablet Commonly known as: PROAMATINE  Take 1 tablet (5 mg total) by mouth 3 (three) times daily with meals.   montelukast  10 MG tablet Commonly known as: SINGULAIR  TAKE 1 TABLET(10 MG) BY MOUTH DAILY   ondansetron  4 MG disintegrating tablet Commonly known as: ZOFRAN -ODT Take 1 tablet (4 mg total) by mouth every 8 (eight) hours as needed for vomiting or nausea.   OXYGEN  Inhale into the lungs. O2 2L, Monitor O2 sat Qshift to maintain O2 Sat >95% every shift   pantoprazole  40 MG tablet Commonly known as: PROTONIX  TAKE 30 TO 60 MINUTES BEFORE THE FIRST AND LAST MEAL OF THE DAY   polyethylene glycol 17 g packet Commonly known as: MIRALAX  / GLYCOLAX  Take 17 g by mouth daily. 1 scoop every 24 hours as needed for constipation   Potassium 99 MG Tabs Take 99 mg by mouth at bedtime.   predniSONE  5 MG tablet Commonly known as: DELTASONE  Take  6 Pills PO for 4 days, 4 Pills PO for 4 days, 2 Pills PO for 4 days, 1 Pills PO for 4 days, 1/2 Pill  PO for 4 days then STOP.   rosuvastatin  10 MG tablet Commonly known as: CRESTOR  Take 1 tablet (10 mg total) by mouth every evening.   TURMERIC CURCUMIN PO Take 1,500 mg by mouth daily.   valACYclovir 1000 MG tablet Commonly known as: VALTREX Take 1,000 mg by mouth as needed. Continue until cold sores resolve.        Review of Systems  Vitals:   10/26/23 0955  BP: 124/66  Pulse: 76  Resp: 18  Temp: 97.6 F (36.4 C)  SpO2: 97%  Weight: 141 lb (64 kg)  Height: 5'  5" (1.651 m)   Body mass index is 23.46 kg/m. Physical Exam  Labs reviewed: Basic Metabolic Panel: Recent Labs    09/03/23 0242 09/04/23 0357 09/05/23 0330 09/06/23 0404 09/11/23 0541 09/23/23 0438  09/25/23 0436 09/28/23 0450 10/10/23 0000 10/26/23 0000  NA  --  138 140 139   < > 136 137 135 140 140  K  --  3.2* 3.2* 3.7   < > 4.0 4.2 3.9 4.3 4.3  CL  --  101 104 105   < > 96* 99 96* 103 103  CO2  --  26 27 22    < > 29 32 28 31* 31*  GLUCOSE  --  150* 117* 95   < > 84 81 92  --   --   BUN  --  12 15 16    < > 16 14 20  33* 15  CREATININE  --  0.68 0.66 0.73   < > 0.57 0.62 0.69 0.5 0.6  CALCIUM   --  7.6* 7.8* 7.9*   < > 8.5* 8.9 8.9 8.3* 8.7  MG 1.8 2.0 2.2 1.8  --   --  2.0 1.9  --   --   PHOS 2.7 2.9 2.3*  --   --   --   --   --   --   --    < > = values in this interval not displayed.   Liver Function Tests: Recent Labs    09/03/23 0200 09/05/23 0330 09/06/23 0404 09/11/23 0541 10/26/23 0000  AST 53*  --  42* 29 19  ALT 46*  --  63* 35 15  ALKPHOS 64  --  57 71  --   BILITOT 1.2  --  0.7 0.9  --   PROT 5.9*  --  5.4* 6.0*  --   ALBUMIN  2.5*   < > 2.2* 2.4* 3.2*   < > = values in this interval not displayed.   Recent Labs    04/01/23 1847  LIPASE 14   No results for input(s): "AMMONIA" in the last 8760 hours. CBC: Recent Labs    09/23/23 0438 09/25/23 0436 09/28/23 0450 10/10/23 0000 10/17/23 0000 10/26/23 0000  WBC 12.9* 13.2* 16.1* 8.9 6.6 6.0  NEUTROABS  --  9.3* 12.6*  --  3,478.00 3,324.00  HGB 9.6* 10.1* 11.0* 10.0* 10.0* 10.1*  HCT 29.7* 31.4* 33.4* 31* 32* 33*  MCV 96.4 96.3 95.4  --   --   --   PLT 310 314 322 234 276 327   Cardiac Enzymes: No results for input(s): "CKTOTAL", "CKMB", "CKMBINDEX", "TROPONINI" in the last 8760 hours. BNP: Invalid input(s): "POCBNP" CBG: Recent Labs    09/29/23 0830 09/29/23 1315 09/29/23 1623  GLUCAP 89 179* 208*    Procedures and Imaging Studies During Stay: [facility] Chest Port 1 View Result Date: 09/27/2023 CLINICAL DATA:  Shortness of breath EXAM: PORTABLE CHEST 1 VIEW COMPARISON:  09/24/2023 FINDINGS: Cardiac shadow is stable. Spinal stimulator is again noted. Bibasilar airspace opacities are again seen  and stable. No new focal infiltrate is noted. No effusion is seen. Bony structures appear within normal limits. IMPRESSION: Stable basilar airspace opacities Electronically Signed   By: Violeta Grey M.D.   On: 09/27/2023 11:21    Assessment/Plan:   There are no diagnoses linked to this encounter.   Patient is being discharged with the following home health services:    Patient is being discharged with the following durable medical equipment:    Patient  has been advised to f/u with their PCP in 1-2 weeks to bring them up to date on their rehab stay.  Social services at facility was responsible for arranging this appointment.  Pt was provided with a 30 day supply of prescriptions for medications and refills must be obtained from their PCP.  For controlled substances, a more limited supply may be provided adequate until PCP appointment only.  Future labs/tests needed:

## 2023-10-31 DIAGNOSIS — D649 Anemia, unspecified: Secondary | ICD-10-CM | POA: Diagnosis not present

## 2023-10-31 DIAGNOSIS — L309 Dermatitis, unspecified: Secondary | ICD-10-CM | POA: Diagnosis not present

## 2023-10-31 DIAGNOSIS — J9611 Chronic respiratory failure with hypoxia: Secondary | ICD-10-CM | POA: Diagnosis not present

## 2023-10-31 DIAGNOSIS — M6281 Muscle weakness (generalized): Secondary | ICD-10-CM | POA: Diagnosis not present

## 2023-10-31 DIAGNOSIS — F411 Generalized anxiety disorder: Secondary | ICD-10-CM | POA: Diagnosis not present

## 2023-10-31 DIAGNOSIS — G309 Alzheimer's disease, unspecified: Secondary | ICD-10-CM | POA: Diagnosis not present

## 2023-10-31 DIAGNOSIS — R41841 Cognitive communication deficit: Secondary | ICD-10-CM | POA: Diagnosis not present

## 2023-10-31 DIAGNOSIS — A4189 Other specified sepsis: Secondary | ICD-10-CM | POA: Diagnosis not present

## 2023-10-31 DIAGNOSIS — J849 Interstitial pulmonary disease, unspecified: Secondary | ICD-10-CM | POA: Diagnosis not present

## 2023-10-31 DIAGNOSIS — M5416 Radiculopathy, lumbar region: Secondary | ICD-10-CM | POA: Diagnosis not present

## 2023-10-31 DIAGNOSIS — J80 Acute respiratory distress syndrome: Secondary | ICD-10-CM | POA: Diagnosis not present

## 2023-10-31 DIAGNOSIS — I959 Hypotension, unspecified: Secondary | ICD-10-CM | POA: Diagnosis not present

## 2023-10-31 DIAGNOSIS — Z89511 Acquired absence of right leg below knee: Secondary | ICD-10-CM | POA: Diagnosis not present

## 2023-10-31 DIAGNOSIS — E782 Mixed hyperlipidemia: Secondary | ICD-10-CM | POA: Diagnosis not present

## 2023-10-31 DIAGNOSIS — J45998 Other asthma: Secondary | ICD-10-CM | POA: Diagnosis not present

## 2023-10-31 DIAGNOSIS — K5901 Slow transit constipation: Secondary | ICD-10-CM | POA: Diagnosis not present

## 2023-10-31 DIAGNOSIS — I2510 Atherosclerotic heart disease of native coronary artery without angina pectoris: Secondary | ICD-10-CM | POA: Diagnosis not present

## 2023-10-31 DIAGNOSIS — K5909 Other constipation: Secondary | ICD-10-CM | POA: Diagnosis not present

## 2023-10-31 DIAGNOSIS — J9601 Acute respiratory failure with hypoxia: Secondary | ICD-10-CM | POA: Diagnosis not present

## 2023-10-31 DIAGNOSIS — R262 Difficulty in walking, not elsewhere classified: Secondary | ICD-10-CM | POA: Diagnosis not present

## 2023-10-31 DIAGNOSIS — G4709 Other insomnia: Secondary | ICD-10-CM | POA: Diagnosis not present

## 2023-10-31 DIAGNOSIS — R2689 Other abnormalities of gait and mobility: Secondary | ICD-10-CM | POA: Diagnosis not present

## 2023-10-31 DIAGNOSIS — F419 Anxiety disorder, unspecified: Secondary | ICD-10-CM | POA: Diagnosis not present

## 2023-10-31 DIAGNOSIS — J189 Pneumonia, unspecified organism: Secondary | ICD-10-CM | POA: Diagnosis not present

## 2023-10-31 DIAGNOSIS — J9600 Acute respiratory failure, unspecified whether with hypoxia or hypercapnia: Secondary | ICD-10-CM | POA: Diagnosis not present

## 2023-10-31 DIAGNOSIS — E785 Hyperlipidemia, unspecified: Secondary | ICD-10-CM | POA: Diagnosis not present

## 2023-10-31 DIAGNOSIS — I5032 Chronic diastolic (congestive) heart failure: Secondary | ICD-10-CM | POA: Diagnosis not present

## 2023-10-31 DIAGNOSIS — T148XXA Other injury of unspecified body region, initial encounter: Secondary | ICD-10-CM | POA: Diagnosis not present

## 2023-10-31 DIAGNOSIS — K219 Gastro-esophageal reflux disease without esophagitis: Secondary | ICD-10-CM | POA: Diagnosis not present

## 2023-10-31 DIAGNOSIS — G3184 Mild cognitive impairment, so stated: Secondary | ICD-10-CM | POA: Diagnosis not present

## 2023-10-31 DIAGNOSIS — F32A Depression, unspecified: Secondary | ICD-10-CM | POA: Diagnosis not present

## 2023-10-31 DIAGNOSIS — J45991 Cough variant asthma: Secondary | ICD-10-CM | POA: Diagnosis not present

## 2023-10-31 DIAGNOSIS — J449 Chronic obstructive pulmonary disease, unspecified: Secondary | ICD-10-CM | POA: Diagnosis not present

## 2023-10-31 DIAGNOSIS — Z8739 Personal history of other diseases of the musculoskeletal system and connective tissue: Secondary | ICD-10-CM | POA: Diagnosis not present

## 2023-10-31 DIAGNOSIS — I2699 Other pulmonary embolism without acute cor pulmonale: Secondary | ICD-10-CM | POA: Diagnosis not present

## 2023-10-31 DIAGNOSIS — I10 Essential (primary) hypertension: Secondary | ICD-10-CM | POA: Diagnosis not present

## 2023-10-31 DIAGNOSIS — M51362 Other intervertebral disc degeneration, lumbar region with discogenic back pain and lower extremity pain: Secondary | ICD-10-CM | POA: Diagnosis not present

## 2023-10-31 DIAGNOSIS — R29898 Other symptoms and signs involving the musculoskeletal system: Secondary | ICD-10-CM | POA: Diagnosis not present

## 2023-10-31 DIAGNOSIS — M25811 Other specified joint disorders, right shoulder: Secondary | ICD-10-CM | POA: Diagnosis not present

## 2023-10-31 DIAGNOSIS — F0280 Dementia in other diseases classified elsewhere without behavioral disturbance: Secondary | ICD-10-CM | POA: Diagnosis not present

## 2023-11-01 ENCOUNTER — Encounter: Payer: Self-pay | Admitting: Adult Health

## 2023-11-01 ENCOUNTER — Non-Acute Institutional Stay (SKILLED_NURSING_FACILITY): Payer: Self-pay | Admitting: Adult Health

## 2023-11-01 DIAGNOSIS — T148XXA Other injury of unspecified body region, initial encounter: Secondary | ICD-10-CM | POA: Diagnosis not present

## 2023-11-01 DIAGNOSIS — I2699 Other pulmonary embolism without acute cor pulmonale: Secondary | ICD-10-CM

## 2023-11-01 DIAGNOSIS — M6281 Muscle weakness (generalized): Secondary | ICD-10-CM | POA: Diagnosis not present

## 2023-11-01 DIAGNOSIS — R262 Difficulty in walking, not elsewhere classified: Secondary | ICD-10-CM | POA: Diagnosis not present

## 2023-11-01 NOTE — Progress Notes (Signed)
 Location:  [facility] Room Number: 20 A Place of Service:  SNF (31) Provider:  Medina-Vargas, Latrice Storlie, DNP, FNP-BC  Patient Care Team: Faustina Hood, MD as PCP - General (Family Medicine) Wenona Hamilton, MD as PCP - Cardiology (Cardiology) Adelaide Adjutant, MD as Consulting Physician (Physical Medicine and Rehabilitation)  Extended Emergency Contact Information Primary Emergency Contact: May,Brian Address: [address]          [place], [place] [address] [place] Home Phone: [phone] Work Phone: [phone] Mobile Phone: [phone] Relation: Brother Secondary Emergency Contact: graham,marisa Mobile Phone: [phone] Relation: Daughter  Code Status:   Full Code  Goals of care: Advanced Directive information    10/26/2023   10:16 AM  Advanced Directives  Does Patient Have a Medical Advance Directive? No  Would patient like information on creating a medical advance directive? No - Patient declined     Chief Complaint  Patient presents with   Acute Visit    Blisters on thigh    HPI:  Pt is a 73 y.o. female seen today for an acute visit regarding blisters on thigh. She is a resident of [facility]. She was supposed to be discharged to [facility] but was cancelled. She is currently having a short-term rehabilitation at [facility]. She has a right BKA and wears a prosthesis. She stated that she has lost a lot of weight and her prosthesis is loose and rubs on her right thigh. She now has a linear blister across her right front thigh.   She has pulmonary embolism and takes Eliquid 5 mg twice a day. She is on O2 @ 2L/min. She is able to walk with a walker and no noted shortness of breath.   Past Medical History:  Diagnosis Date   Anxiety    Arthritis    Asthma    Avascular necrosis of talus (HCC)    Cancer (HCC)    vulva pre cancer had surgery   Depression    Gait abnormality 03/20/2017   GERD (gastroesophageal  reflux disease)    Hearing loss    "very minor"   History of pneumonia    Hyperlipidemia    Hypertension    Leg edema    left   Memory difficulty 03/20/2017   PONV (postoperative nausea and vomiting)    Stress incontinence    Past Surgical History:  Procedure Laterality Date   ANKLE FUSION  2011   right multiple    ANKLE FUSION     rear ankle fusion   ANKLE SURGERY  2010   right cordicompression   AORTIC ARCH ANGIOGRAPHY N/A 12/11/2017   Procedure: AORTIC ARCH ANGIOGRAPHY;  Surgeon: Richrd Char, MD;  Location: [facility] INVASIVE CV LAB;  Service: Cardiovascular;  Laterality: N/A;   APPENDECTOMY  05/10/2016   BELOW KNEE LEG AMPUTATION     CATARACT EXTRACTION W/ INTRAOCULAR LENS IMPLANT Left    LAPAROSCOPIC APPENDECTOMY N/A 05/10/2016   Procedure: APPENDECTOMY LAPAROSCOPIC;  Surgeon: Oza Blumenthal, MD;  Location: [facility] OR;  Service: General;  Laterality: N/A;   LUMBAR LAMINECTOMY/DECOMPRESSION MICRODISCECTOMY N/A 09/09/2015   Procedure:  L4-S1 Decompression/ Discetomy;  Surgeon: Mort Ards, MD;  Location: [facility] OR;  Service: Orthopedics;  Laterality: N/A;   SKIN GRAFT     SPINAL CORD STIMULATOR BATTERY EXCHANGE  06/23/2023   SPINAL CORD STIMULATOR INSERTION N/A 01/10/2019   Procedure: LUMBAR SPINAL CORD STIMULATOR INSERTION;  Surgeon: Mort Ards, MD;  Location: [facility] OR;  Service: Orthopedics;  Laterality: N/A;  2.5 hrs   TONSILLECTOMY     TUBAL LIGATION  1983   VULVECTOMY N/A 01/28/2015   Procedure: WIDE EXCISION VULVECTOMY;  Surgeon: Johnn Najjar, MD;  Location: [facility] ORS;  Service: Gynecology;  Laterality: N/A;    Allergies  Allergen Reactions   Delsym [Dextromethorphan] Other (See Comments)    Cognitive impairment    Outpatient Encounter Medications as of 11/01/2023  Medication Sig   acyclovir cream (ZOVIRAX) 5 % Apply 1 Application topically 2 (two) times daily as needed (cold sore).   albuterol  (PROAIR  HFA) 108 (90 Base) MCG/ACT inhaler 2 puffs every 4 hours as  needed only  if your can't catch your breath   apixaban  (ELIQUIS ) 5 MG TABS tablet Take 1 tablet (5 mg total) by mouth 2 (two) times daily.   benzonatate  (TESSALON ) 200 MG capsule Take 1 capsule (200 mg total) by mouth 3 (three) times daily as needed for cough. TAKE 1 CAPSULE(200 MG) BY MOUTH THREE TIMES DAILY AS NEEDED FOR COUGH   budesonide -formoterol  (SYMBICORT ) 160-4.5 MCG/ACT inhaler Inhale 2 puffs into the lungs in the morning and at bedtime.   buPROPion  (WELLBUTRIN  XL) 300 MG 24 hr tablet Take 300 mg by mouth daily.   calcium  carbonate (OSCAL) 1500 (600 Ca) MG TABS tablet Take 600 mg of elemental calcium  by mouth at bedtime.   denosumab  (PROLIA ) 60 MG/ML SOLN injection Inject 60 mg into the skin every 6 (six) months.    donepezil  (ARICEPT ) 10 MG tablet Take 1 tablet (10 mg total) by mouth at bedtime.   FLUoxetine  (PROZAC ) 20 MG capsule Take 20 mg by mouth daily.   ipratropium-albuterol  (DUONEB) 0.5-2.5 (3) MG/3ML SOLN Take 3 mLs by nebulization every 6 (six) hours as needed (Shortness of breath, cough and/or wheeze). Use this or albuterol  inhaler but not both.   lactose free nutrition (BOOST) LIQD Take 237 mLs by mouth daily. Prefers vanilla (Patient not taking: Reported on 10/26/2023)   MAGNESIUM  GLYCINATE PO Take 1 tablet by mouth at bedtime. 200 mg   melatonin 3 MG TABS tablet Take 3 mg by mouth at bedtime.   metoprolol  tartrate (LOPRESSOR ) 12.5 mg TABS tablet Take 12.5 mg by mouth 2 (two) times daily. (Patient not taking: Reported on 10/26/2023)   metoprolol  tartrate (LOPRESSOR ) 25 MG tablet Take 1 tablet (25 mg total) by mouth 2 (two) times daily. (Patient not taking: Reported on 10/26/2023)   midodrine  (PROAMATINE ) 5 MG tablet Take 1 tablet (5 mg total) by mouth 3 (three) times daily with meals.   montelukast  (SINGULAIR ) 10 MG tablet TAKE 1 TABLET(10 MG) BY MOUTH DAILY   Multiple Vitamins-Minerals (CENTRUM SILVER WOMEN 50+) TABS Take 1 tablet by mouth daily.   ondansetron  (ZOFRAN -ODT) 4  MG disintegrating tablet Take 1 tablet (4 mg total) by mouth every 8 (eight) hours as needed for vomiting or nausea.   OXYGEN  Inhale into the lungs. O2 2L, Monitor O2 sat Qshift to maintain O2 Sat >95% every shift   pantoprazole  (PROTONIX ) 40 MG tablet TAKE 30 TO 60 MINUTES BEFORE THE FIRST AND LAST MEAL OF THE DAY   polyethylene glycol (MIRALAX  / GLYCOLAX ) 17 g packet Take 17 g by mouth daily. 1 scoop every 24 hours as needed for constipation   Potassium 99 MG TABS Take 99 mg by mouth at bedtime.   predniSONE  (DELTASONE ) 5 MG tablet Take  6 Pills PO for 4 days, 4 Pills PO for 4 days, 2 Pills PO for 4 days, 1 Pills PO for 4 days,  1/2 Pill  PO for 4 days then STOP. (Patient not taking: Reported on 10/26/2023)   rosuvastatin  (CRESTOR ) 10 MG tablet Take 1 tablet (10 mg total) by mouth every evening.   TURMERIC CURCUMIN PO Take 1,500 mg by mouth daily.   valACYclovir (VALTREX) 1000 MG tablet Take 1,000 mg by mouth as needed. Continue until cold sores resolve.   No facility-administered encounter medications on file as of 11/01/2023.    Review of Systems  Constitutional:  Negative for appetite change, chills, fatigue and fever.  HENT:  Negative for congestion, hearing loss, rhinorrhea and sore throat.   Eyes: Negative.   Respiratory:  Negative for cough, shortness of breath and wheezing.   Cardiovascular:  Negative for chest pain, palpitations and leg swelling.  Gastrointestinal:  Negative for abdominal pain, constipation, diarrhea, nausea and vomiting.  Genitourinary:  Negative for dysuria.  Musculoskeletal:  Negative for arthralgias, back pain and myalgias.  Skin:  Negative for color change, rash and wound.       blister  Neurological:  Negative for dizziness, weakness and headaches.  Psychiatric/Behavioral:  Negative for behavioral problems. The patient is not nervous/anxious.       Immunization History  Administered Date(s) Administered   Influenza Split 02/06/2011, 02/24/2011,  03/05/2013, 04/06/2014   Influenza, High Dose Seasonal PF 03/08/2021   Influenza,inj,Quad PF,6+ Mos 04/24/2015   Influenza,inj,quad, With Preservative 04/27/2017   Influenza-Unspecified 02/22/2011, 02/19/2012, 05/13/2016, 05/02/2017, 05/22/2018, 03/11/2019, 03/03/2020, 03/04/2021, 04/11/2022   PFIZER Comirnaty(Gray Top)Covid-19 Tri-Sucrose Vaccine 02/05/2023   PFIZER(Purple Top)SARS-COV-2 Vaccination 06/23/2019, 07/13/2019, 03/08/2020   PNEUMOCOCCAL CONJUGATE-20 09/24/2021   Pneumococcal Conjugate-13 11/15/2013   Pneumococcal Polysaccharide-23 12/25/2014   Tdap 04/11/2011, 09/24/2021, 06/25/2023   Unspecified SARS-COV-2 Vaccination 05/21/2021   Zoster Recombinant(Shingrix) 09/28/2017, 02/01/2018   Zoster, Live 05/07/2012, 09/28/2017, 02/01/2018   Pertinent  Health Maintenance Due  Topic Date Due   FOOT EXAM  Never done   OPHTHALMOLOGY EXAM  Never done   INFLUENZA VACCINE  01/05/2024   HEMOGLOBIN A1C  04/27/2024   MAMMOGRAM  06/13/2025   Colonoscopy  03/22/2026   DEXA SCAN  Completed      01/10/2019   11:33 AM 01/10/2019    6:58 PM 01/10/2019    8:45 PM 01/11/2019    2:25 PM 03/02/2021    7:27 AM  Fall Risk  Falls in the past year?     1  Was there an injury with Fall?     1  Was there an injury with Fall? - Comments     bruises  Fall Risk Category Calculator     3  Fall Risk Category (Retired)     High  (RETIRED) Patient Fall Risk Level Moderate fall risk High fall risk Moderate fall risk Low fall risk      Vitals:   11/01/23 1521  BP: 120/70  Pulse: 76  Resp: 18  Temp: 97.6 F (36.4 C)  SpO2: 96%  Weight: 141 lb (64 kg)  Height: 5\' 5"  (1.651 m)   Body mass index is 23.46 kg/m.  Physical Exam Constitutional:      Appearance: Normal appearance.  HENT:     Head: Normocephalic and atraumatic.     Nose: Nose normal.     Mouth/Throat:     Mouth: Mucous membranes are moist.  Eyes:     Conjunctiva/sclera: Conjunctivae normal.  Cardiovascular:     Rate and Rhythm:  Normal rate. Rhythm irregular.  Pulmonary:     Effort: Pulmonary effort is normal.     Breath  sounds: Normal breath sounds.  Abdominal:     General: Bowel sounds are normal.     Palpations: Abdomen is soft.  Musculoskeletal:     Cervical back: Normal range of motion.     Comments: Right BKA with prosthesis  Skin:    Comments: Right thigh with a linear blister across the thigh  Neurological:     General: No focal deficit present.     Mental Status: She is alert and oriented to person, place, and time.  Psychiatric:        Mood and Affect: Mood normal.        Behavior: Behavior normal.      Labs reviewed: Recent Labs    09/03/23 0242 09/04/23 0357 09/05/23 0330 09/06/23 0404 09/11/23 0541 09/23/23 0438 09/25/23 0436 09/28/23 0450 10/10/23 0000 10/26/23 0000  NA  --  138 140 139   < > 136 137 135 140 140  K  --  3.2* 3.2* 3.7   < > 4.0 4.2 3.9 4.3 4.3  CL  --  101 104 105   < > 96* 99 96* 103 103  CO2  --  26 27 22    < > 29 32 28 31* 31*  GLUCOSE  --  150* 117* 95   < > 84 81 92  --   --   BUN  --  12 15 16    < > 16 14 20  33* 15  CREATININE  --  0.68 0.66 0.73   < > 0.57 0.62 0.69 0.5 0.6  CALCIUM   --  7.6* 7.8* 7.9*   < > 8.5* 8.9 8.9 8.3* 8.7  MG 1.8 2.0 2.2 1.8  --   --  2.0 1.9  --   --   PHOS 2.7 2.9 2.3*  --   --   --   --   --   --   --    < > = values in this interval not displayed.   Recent Labs    09/03/23 0200 09/05/23 0330 09/06/23 0404 09/11/23 0541 10/26/23 0000  AST 53*  --  42* 29 19  ALT 46*  --  63* 35 15  ALKPHOS 64  --  57 71  --   BILITOT 1.2  --  0.7 0.9  --   PROT 5.9*  --  5.4* 6.0*  --   ALBUMIN  2.5*   < > 2.2* 2.4* 3.2*   < > = values in this interval not displayed.   Recent Labs    09/23/23 0438 09/25/23 0436 09/28/23 0450 10/10/23 0000 10/17/23 0000 10/26/23 0000  WBC 12.9* 13.2* 16.1* 8.9 6.6 6.0  NEUTROABS  --  9.3* 12.6*  --  3,478.00 3,324.00  HGB 9.6* 10.1* 11.0* 10.0* 10.0* 10.1*  HCT 29.7* 31.4* 33.4* 31* 32* 33*   MCV 96.4 96.3 95.4  --   --   --   PLT 310 314 322 234 276 327   Lab Results  Component Value Date   TSH 0.74 10/26/2023   Lab Results  Component Value Date   HGBA1C 5.8 10/26/2023   Lab Results  Component Value Date   CHOL 139 10/26/2023   HDL 56 10/26/2023   LDLCALC 69 10/26/2023   TRIG 62 10/26/2023    Significant Diagnostic Results in last 30 days:  No results found.  Assessment/Plan  1. Blister (Primary) -  Blister on right thigh possibly caused by her loose prosthesis rubbing on her thigh -   Cleanse  right thigh blister with NS, apply xeroform dressing then cover with gauze Q 3 days and PRN till healed -  monitor for redness, drainage  2. Acute pulmonary embolism, unspecified pulmonary embolism type, unspecified whether acute cor pulmonale present (HCC) -  no shortness of breath -  continue Eliquis  5 mg BID    Family/ staff Communication: Discussed plan of care with resident and charge nurse.  Labs/tests ordered: None    Felicity Penix Medina-Vargas, DNP, MSN, FNP-BC [facility] [phone] (Monday-Friday 8:00 a.m. - 5:00 p.m.) [phone] (after hours)

## 2023-11-03 DIAGNOSIS — M6281 Muscle weakness (generalized): Secondary | ICD-10-CM | POA: Diagnosis not present

## 2023-11-03 DIAGNOSIS — R262 Difficulty in walking, not elsewhere classified: Secondary | ICD-10-CM | POA: Diagnosis not present

## 2023-11-06 DIAGNOSIS — R29898 Other symptoms and signs involving the musculoskeletal system: Secondary | ICD-10-CM | POA: Diagnosis not present

## 2023-11-06 DIAGNOSIS — M6281 Muscle weakness (generalized): Secondary | ICD-10-CM | POA: Diagnosis not present

## 2023-11-06 DIAGNOSIS — R2689 Other abnormalities of gait and mobility: Secondary | ICD-10-CM | POA: Diagnosis not present

## 2023-11-06 DIAGNOSIS — R41841 Cognitive communication deficit: Secondary | ICD-10-CM | POA: Diagnosis not present

## 2023-11-07 DIAGNOSIS — R29898 Other symptoms and signs involving the musculoskeletal system: Secondary | ICD-10-CM | POA: Diagnosis not present

## 2023-11-07 DIAGNOSIS — M6281 Muscle weakness (generalized): Secondary | ICD-10-CM | POA: Diagnosis not present

## 2023-11-07 DIAGNOSIS — R2689 Other abnormalities of gait and mobility: Secondary | ICD-10-CM | POA: Diagnosis not present

## 2023-11-07 DIAGNOSIS — R41841 Cognitive communication deficit: Secondary | ICD-10-CM | POA: Diagnosis not present

## 2023-11-08 DIAGNOSIS — R2689 Other abnormalities of gait and mobility: Secondary | ICD-10-CM | POA: Diagnosis not present

## 2023-11-08 DIAGNOSIS — R29898 Other symptoms and signs involving the musculoskeletal system: Secondary | ICD-10-CM | POA: Diagnosis not present

## 2023-11-08 DIAGNOSIS — R41841 Cognitive communication deficit: Secondary | ICD-10-CM | POA: Diagnosis not present

## 2023-11-08 DIAGNOSIS — M6281 Muscle weakness (generalized): Secondary | ICD-10-CM | POA: Diagnosis not present

## 2023-11-09 DIAGNOSIS — R2689 Other abnormalities of gait and mobility: Secondary | ICD-10-CM | POA: Diagnosis not present

## 2023-11-09 DIAGNOSIS — R41841 Cognitive communication deficit: Secondary | ICD-10-CM | POA: Diagnosis not present

## 2023-11-09 DIAGNOSIS — R29898 Other symptoms and signs involving the musculoskeletal system: Secondary | ICD-10-CM | POA: Diagnosis not present

## 2023-11-09 DIAGNOSIS — M6281 Muscle weakness (generalized): Secondary | ICD-10-CM | POA: Diagnosis not present

## 2023-11-10 DIAGNOSIS — R29898 Other symptoms and signs involving the musculoskeletal system: Secondary | ICD-10-CM | POA: Diagnosis not present

## 2023-11-10 DIAGNOSIS — R2689 Other abnormalities of gait and mobility: Secondary | ICD-10-CM | POA: Diagnosis not present

## 2023-11-10 DIAGNOSIS — R41841 Cognitive communication deficit: Secondary | ICD-10-CM | POA: Diagnosis not present

## 2023-11-10 DIAGNOSIS — M6281 Muscle weakness (generalized): Secondary | ICD-10-CM | POA: Diagnosis not present

## 2023-11-13 DIAGNOSIS — R29898 Other symptoms and signs involving the musculoskeletal system: Secondary | ICD-10-CM | POA: Diagnosis not present

## 2023-11-13 DIAGNOSIS — R2689 Other abnormalities of gait and mobility: Secondary | ICD-10-CM | POA: Diagnosis not present

## 2023-11-13 DIAGNOSIS — R41841 Cognitive communication deficit: Secondary | ICD-10-CM | POA: Diagnosis not present

## 2023-11-13 DIAGNOSIS — M6281 Muscle weakness (generalized): Secondary | ICD-10-CM | POA: Diagnosis not present

## 2023-11-14 ENCOUNTER — Non-Acute Institutional Stay (SKILLED_NURSING_FACILITY): Payer: Self-pay | Admitting: Nurse Practitioner

## 2023-11-14 ENCOUNTER — Encounter: Payer: Self-pay | Admitting: Nurse Practitioner

## 2023-11-14 DIAGNOSIS — I2699 Other pulmonary embolism without acute cor pulmonale: Secondary | ICD-10-CM

## 2023-11-14 DIAGNOSIS — E785 Hyperlipidemia, unspecified: Secondary | ICD-10-CM

## 2023-11-14 DIAGNOSIS — F32A Depression, unspecified: Secondary | ICD-10-CM

## 2023-11-14 DIAGNOSIS — D649 Anemia, unspecified: Secondary | ICD-10-CM

## 2023-11-14 DIAGNOSIS — J9601 Acute respiratory failure with hypoxia: Secondary | ICD-10-CM

## 2023-11-14 DIAGNOSIS — Z8739 Personal history of other diseases of the musculoskeletal system and connective tissue: Secondary | ICD-10-CM | POA: Diagnosis not present

## 2023-11-14 DIAGNOSIS — Z89511 Acquired absence of right leg below knee: Secondary | ICD-10-CM | POA: Diagnosis not present

## 2023-11-14 DIAGNOSIS — R29898 Other symptoms and signs involving the musculoskeletal system: Secondary | ICD-10-CM | POA: Diagnosis not present

## 2023-11-14 DIAGNOSIS — R2689 Other abnormalities of gait and mobility: Secondary | ICD-10-CM | POA: Diagnosis not present

## 2023-11-14 DIAGNOSIS — M6281 Muscle weakness (generalized): Secondary | ICD-10-CM | POA: Diagnosis not present

## 2023-11-14 DIAGNOSIS — R41841 Cognitive communication deficit: Secondary | ICD-10-CM | POA: Diagnosis not present

## 2023-11-14 DIAGNOSIS — F419 Anxiety disorder, unspecified: Secondary | ICD-10-CM | POA: Diagnosis not present

## 2023-11-14 DIAGNOSIS — G3184 Mild cognitive impairment, so stated: Secondary | ICD-10-CM | POA: Diagnosis not present

## 2023-11-14 DIAGNOSIS — K5901 Slow transit constipation: Secondary | ICD-10-CM

## 2023-11-14 DIAGNOSIS — L309 Dermatitis, unspecified: Secondary | ICD-10-CM | POA: Diagnosis not present

## 2023-11-14 DIAGNOSIS — I959 Hypotension, unspecified: Secondary | ICD-10-CM | POA: Diagnosis not present

## 2023-11-14 NOTE — Assessment & Plan Note (Signed)
 Acute respiratory failure with hypoxia, chronic O2 via [place], followed by Pulmonology. On Montelukast , DuoNeb, Symbicort ,  Tessalon 

## 2023-11-14 NOTE — Assessment & Plan Note (Signed)
taking Donepezil

## 2023-11-14 NOTE — Assessment & Plan Note (Signed)
 Her mood is stable, on Fluoxetine , Wellbutrin . TSH 0.74 10/26/23

## 2023-11-14 NOTE — Assessment & Plan Note (Signed)
 Blood pressure is controlled, on midodrine , off Metoprolol 

## 2023-11-14 NOTE — Assessment & Plan Note (Signed)
Stable, on MiraLax 

## 2023-11-14 NOTE — Assessment & Plan Note (Signed)
 taking Rosuvastatin .

## 2023-11-14 NOTE — Assessment & Plan Note (Signed)
on Eliquis 

## 2023-11-14 NOTE — Assessment & Plan Note (Signed)
 on Prolia , Ca, Vit D, DEXA 09/2022 wnl

## 2023-11-14 NOTE — Progress Notes (Signed)
 Location:  [facility] Room Number: N020-A Place of Service:  SNF (31) Provider:  Alitza Cowman X, NP    Patient Care Team: Faustina Hood, MD as PCP - General (Family Medicine) Wenona Hamilton, MD as PCP - Cardiology (Cardiology) Adelaide Adjutant, MD as Consulting Physician (Physical Medicine and Rehabilitation)  Extended Emergency Contact Information Primary Emergency Contact: May,Brian Address: [address]          [place], [place] [address] [place] Home Phone: [phone] Work Phone: [phone] Mobile Phone: [phone] Relation: Brother Secondary Emergency Contact: graham,marisa Mobile Phone: [phone] Relation: Daughter  Code Status:  Full Code Goals of care: Advanced Directive information    11/14/2023   10:06 AM  Advanced Directives  Does Patient Have a Medical Advance Directive? Yes  Type of Estate agent of [place];Living will  Does patient want to make changes to medical advance directive? No - Patient declined  Copy of Healthcare Power of Attorney in Chart? No - copy requested  Would patient like information on creating a medical advance directive? No - Patient declined     Chief Complaint  Patient presents with   Medical Management of Chronic Issues    Routine Visit, In addition needs to discuss Eye and Foot Exam, Diabetic Kidney evaluation- Urine ACR and Hepatitis C Screening    HPI:  Pt is a 73 y.o. female seen today for medical management of chronic diseases.    Noted skin irritation at the upper rim of the right prosthetic leg.   Acute respiratory failure with hypoxia, chronic O2 via [place], followed by Pulmonology. On Montelukast , DuoNeb, Symbicort ,  Tessalon              PE, on Eliquis              Hypotension, on midodrine , off Metoprolol              Anemia, Vit B12 1285 09/12/23, Iron 42 09/15/23, Hgb 10.1 10/26/23             Constipation, on MiraLax              Cognitive impairment, taking  Donepezil .              Depression, on Fluoxetine , Wellbutrin . TSH 0.74 10/26/23             GERD, on Pantoprazole              Hx of T2DM, s/p R BKA, Hgb 5.8 10/26/23             OP, on Prolia , Ca, Vit D, DEXA 09/2022 wnl             LDL taking Rosuvastatin .   Past Medical History:  Diagnosis Date   Anxiety    Arthritis    Asthma    Avascular necrosis of [place] (HCC)    Cancer (HCC)    vulva pre cancer had surgery   Depression    Gait abnormality 03/20/2017   GERD (gastroesophageal reflux disease)    Hearing loss    "very minor"   History of pneumonia    Hyperlipidemia    Hypertension    Leg edema    left   Memory difficulty 03/20/2017   PONV (postoperative nausea and vomiting)    Stress incontinence    Past Surgical History:  Procedure Laterality Date   ANKLE FUSION  2011   right multiple    ANKLE FUSION     rear ankle fusion  ANKLE SURGERY  2010   right cordicompression   AORTIC ARCH ANGIOGRAPHY N/A 12/11/2017   Procedure: AORTIC ARCH ANGIOGRAPHY;  Surgeon: Richrd Char, MD;  Location: [facility];  Service: Cardiovascular;  Laterality: N/A;   APPENDECTOMY  05/10/2016   BELOW KNEE LEG AMPUTATION     CATARACT EXTRACTION W/ INTRAOCULAR LENS IMPLANT Left    LAPAROSCOPIC APPENDECTOMY N/A 05/10/2016   Procedure: APPENDECTOMY LAPAROSCOPIC;  Surgeon: Oza Blumenthal, MD;  Location: [facility] OR;  Service: General;  Laterality: N/A;   LUMBAR LAMINECTOMY/DECOMPRESSION MICRODISCECTOMY N/A 09/09/2015   Procedure:  L4-S1 Decompression/ Discetomy;  Surgeon: Mort Ards, MD;  Location: [facility] OR;  Service: Orthopedics;  Laterality: N/A;   SKIN GRAFT     SPINAL CORD STIMULATOR BATTERY EXCHANGE  06/23/2023   SPINAL CORD STIMULATOR INSERTION N/A 01/10/2019   Procedure: LUMBAR SPINAL CORD STIMULATOR INSERTION;  Surgeon: Mort Ards, MD;  Location: [facility] OR;  Service: Orthopedics;  Laterality: N/A;  2.5 hrs   TONSILLECTOMY     TUBAL LIGATION  1983   VULVECTOMY N/A 01/28/2015    Procedure: WIDE EXCISION VULVECTOMY;  Surgeon: Johnn Najjar, MD;  Location: [facility] ORS;  Service: Gynecology;  Laterality: N/A;    Allergies  Allergen Reactions   Delsym [Dextromethorphan] Other (See Comments)    Cognitive impairment    Outpatient Encounter Medications as of 11/14/2023  Medication Sig   acyclovir cream (ZOVIRAX) 5 % Apply 1 Application topically 2 (two) times daily as needed (cold sore).   albuterol  (PROAIR  HFA) 108 (90 Base) MCG/ACT inhaler 2 puffs every 4 hours as needed only  if your can't catch your breath   apixaban  (ELIQUIS ) 5 MG TABS tablet Take 1 tablet (5 mg total) by mouth 2 (two) times daily.   benzonatate  (TESSALON ) 200 MG capsule Take 1 capsule (200 mg total) by mouth 3 (three) times daily as needed for cough. TAKE 1 CAPSULE(200 MG) BY MOUTH THREE TIMES DAILY AS NEEDED FOR COUGH   budesonide -formoterol  (SYMBICORT ) 160-4.5 MCG/ACT inhaler Inhale 2 puffs into the lungs in the morning and at bedtime.   buPROPion  (WELLBUTRIN  XL) 300 MG 24 hr tablet Take 300 mg by mouth daily.   calcium  carbonate (OSCAL) 1500 (600 Ca) MG TABS tablet Take 600 mg of elemental calcium  by mouth at bedtime.   denosumab  (PROLIA ) 60 MG/ML SOLN injection Inject 60 mg into the skin every 6 (six) months.    donepezil  (ARICEPT ) 10 MG tablet Take 1 tablet (10 mg total) by mouth at bedtime.   FLUoxetine  (PROZAC ) 20 MG capsule Take 20 mg by mouth daily.   ipratropium-albuterol  (DUONEB) 0.5-2.5 (3) MG/3ML SOLN Take 3 mLs by nebulization every 6 (six) hours as needed (Shortness of breath, cough and/or wheeze). Use this or albuterol  inhaler but not both.   MAGNESIUM  GLYCINATE PO Take 1 tablet by mouth at bedtime. 200 mg   melatonin 3 MG TABS tablet Take 3 mg by mouth at bedtime.   midodrine  (PROAMATINE ) 5 MG tablet Take 1 tablet (5 mg total) by mouth 3 (three) times daily with meals.   montelukast  (SINGULAIR ) 10 MG tablet TAKE 1 TABLET(10 MG) BY MOUTH DAILY   Multiple Vitamins-Minerals (CENTRUM SILVER  WOMEN 50+) TABS Take 1 tablet by mouth daily.   ondansetron  (ZOFRAN -ODT) 4 MG disintegrating tablet Take 1 tablet (4 mg total) by mouth every 8 (eight) hours as needed for vomiting or nausea.   OXYGEN  Inhale into the lungs. O2 2L, Monitor O2 sat Qshift to maintain O2 Sat >95% every shift  pantoprazole  (PROTONIX ) 40 MG tablet TAKE 30 TO 60 MINUTES BEFORE THE FIRST AND LAST MEAL OF THE DAY   polyethylene glycol (MIRALAX  / GLYCOLAX ) 17 g packet Take 17 g by mouth daily. 1 scoop every 24 hours as needed for constipation   Potassium 99 MG TABS Take 99 mg by mouth at bedtime.   rosuvastatin  (CRESTOR ) 10 MG tablet Take 1 tablet (10 mg total) by mouth every evening.   TURMERIC CURCUMIN PO Take 1,500 mg by mouth daily.   valACYclovir (VALTREX) 1000 MG tablet Take 1,000 mg by mouth as needed. Continue until cold sores resolve.   [DISCONTINUED] lactose free nutrition (BOOST) LIQD Take 237 mLs by mouth daily. Prefers vanilla (Patient not taking: Reported on 11/14/2023)   [DISCONTINUED] metoprolol  tartrate (LOPRESSOR ) 12.5 mg TABS tablet Take 12.5 mg by mouth 2 (two) times daily. (Patient not taking: Reported on 11/14/2023)   [DISCONTINUED] metoprolol  tartrate (LOPRESSOR ) 25 MG tablet Take 1 tablet (25 mg total) by mouth 2 (two) times daily. (Patient not taking: Reported on 11/14/2023)   [DISCONTINUED] predniSONE  (DELTASONE ) 5 MG tablet Take  6 Pills PO for 4 days, 4 Pills PO for 4 days, 2 Pills PO for 4 days, 1 Pills PO for 4 days, 1/2 Pill  PO for 4 days then STOP. (Patient not taking: Reported on 11/14/2023)   No facility-administered encounter medications on file as of 11/14/2023.    Review of Systems  Constitutional:  Negative for appetite change, fatigue and fever.  HENT:  Negative for congestion and trouble swallowing.   Eyes:  Negative for visual disturbance.  Respiratory:  Positive for cough and shortness of breath. Negative for chest tightness and wheezing.   Gastrointestinal:  Negative for abdominal  pain and constipation.  Genitourinary:  Negative for dysuria, frequency and urgency.  Musculoskeletal:  Positive for arthralgias and gait problem.  Skin:  Positive for rash.  Neurological:  Negative for speech difficulty, weakness and headaches.  Psychiatric/Behavioral:  Negative for behavioral problems and sleep disturbance. The patient is not nervous/anxious.     Immunization History  Administered Date(s) Administered   Influenza Split 02/06/2011, 02/24/2011, 03/05/2013, 04/06/2014   Influenza, High Dose Seasonal PF 03/08/2021   Influenza,inj,Quad PF,6+ Mos 04/24/2015   Influenza,inj,quad, With Preservative 04/27/2017   Influenza-Unspecified 02/22/2011, 02/19/2012, 05/13/2016, 05/02/2017, 05/22/2018, 03/11/2019, 03/03/2020, 03/04/2021, 04/11/2022   PFIZER Comirnaty(Gray Top)Covid-19 Tri-Sucrose Vaccine 02/05/2023   PFIZER(Purple Top)SARS-COV-2 Vaccination 06/23/2019, 07/13/2019, 03/08/2020   PNEUMOCOCCAL CONJUGATE-20 09/24/2021   Pneumococcal Conjugate-13 11/15/2013   Pneumococcal Polysaccharide-23 12/25/2014   Tdap 04/11/2011, 09/24/2021, 06/25/2023   Unspecified SARS-COV-2 Vaccination 05/21/2021   Zoster Recombinant(Shingrix) 09/28/2017, 02/01/2018   Zoster, Live 05/07/2012, 09/28/2017, 02/01/2018   Pertinent  Health Maintenance Due  Topic Date Due   FOOT EXAM  Never done   OPHTHALMOLOGY EXAM  Never done   INFLUENZA VACCINE  01/05/2024   HEMOGLOBIN A1C  04/27/2024   Colonoscopy  03/22/2026   DEXA SCAN  Completed      01/10/2019   11:33 AM 01/10/2019    6:58 PM 01/10/2019    8:45 PM 01/11/2019    2:25 PM 03/02/2021    7:27 AM  Fall Risk  Falls in the past year?     1  Was there an injury with Fall?     1  Was there an injury with Fall? - Comments     bruises  Fall Risk Category Calculator     3  Fall Risk Category (Retired)     High  (RETIRED) Patient Fall Risk  Level Moderate fall risk High fall risk Moderate fall risk Low fall risk    Functional Status Survey:     Vitals:   11/14/23 0947  BP: 108/64  Pulse: 72  Resp: 19  Temp: (!) 97.4 F (36.3 C)  SpO2: 99%  Weight: 146 lb 6.4 oz (66.4 kg)  Height: 5\' 5"  (1.651 m)   Body mass index is 24.36 kg/m. Physical Exam Vitals and nursing note reviewed.  Constitutional:      Appearance: Normal appearance.  HENT:     Head: Normocephalic and atraumatic.     Nose: Nose normal.     Mouth/Throat:     Mouth: Mucous membranes are moist.  Eyes:     Extraocular Movements: Extraocular movements intact.     Conjunctiva/sclera: Conjunctivae normal.     Pupils: Pupils are equal, round, and reactive to light.  Cardiovascular:     Rate and Rhythm: Normal rate and regular rhythm.     Heart sounds: No murmur heard. Pulmonary:     Effort: Pulmonary effort is normal.     Breath sounds: Rales present. No wheezing or rhonchi.     Comments: Diffused rales  Abdominal:     General: Bowel sounds are normal.     Palpations: Abdomen is soft.     Tenderness: There is no abdominal tenderness.  Musculoskeletal:        General: Normal range of motion.     Cervical back: Normal range of motion and neck supple.     Right lower leg: No edema.     Left lower leg: No edema.  Skin:    General: Skin is warm and dry.     Findings: Rash present.     Comments: Middle right thigh scaly red papules, pruritic.   Neurological:     General: No focal deficit present.     Mental Status: She is alert and oriented to person, place, and time. Mental status is at baseline.     Motor: No weakness.     Coordination: Coordination normal.     Gait: Gait abnormal.     Comments: R BKA  Psychiatric:        Mood and Affect: Mood normal.        Behavior: Behavior normal.        Thought Content: Thought content normal.        Judgment: Judgment normal.     Labs reviewed: Recent Labs    09/03/23 0242 09/04/23 0357 09/05/23 0330 09/06/23 0404 09/11/23 0541 09/23/23 0438 09/25/23 0436 09/28/23 0450 10/10/23 0000  10/26/23 0000  NA  --  138 140 139   < > 136 137 135 140 140  K  --  3.2* 3.2* 3.7   < > 4.0 4.2 3.9 4.3 4.3  CL  --  101 104 105   < > 96* 99 96* 103 103  CO2  --  26 27 22    < > 29 32 28 31* 31*  GLUCOSE  --  150* 117* 95   < > 84 81 92  --   --   BUN  --  12 15 16    < > 16 14 20  33* 15  CREATININE  --  0.68 0.66 0.73   < > 0.57 0.62 0.69 0.5 0.6  CALCIUM   --  7.6* 7.8* 7.9*   < > 8.5* 8.9 8.9 8.3* 8.7  MG 1.8 2.0 2.2 1.8  --   --  2.0 1.9  --   --  PHOS 2.7 2.9 2.3*  --   --   --   --   --   --   --    < > = values in this interval not displayed.   Recent Labs    09/03/23 0200 09/05/23 0330 09/06/23 0404 09/11/23 0541 10/26/23 0000  AST 53*  --  42* 29 19  ALT 46*  --  63* 35 15  ALKPHOS 64  --  57 71  --   BILITOT 1.2  --  0.7 0.9  --   PROT 5.9*  --  5.4* 6.0*  --   ALBUMIN  2.5*   < > 2.2* 2.4* 3.2*   < > = values in this interval not displayed.   Recent Labs    09/23/23 0438 09/25/23 0436 09/28/23 0450 10/10/23 0000 10/17/23 0000 10/26/23 0000  WBC 12.9* 13.2* 16.1* 8.9 6.6 6.0  NEUTROABS  --  9.3* 12.6*  --  3,478.00 3,324.00  HGB 9.6* 10.1* 11.0* 10.0* 10.0* 10.1*  HCT 29.7* 31.4* 33.4* 31* 32* 33*  MCV 96.4 96.3 95.4  --   --   --   PLT 310 314 322 234 276 327   Lab Results  Component Value Date   TSH 0.74 10/26/2023   Lab Results  Component Value Date   HGBA1C 5.8 10/26/2023   Lab Results  Component Value Date   CHOL 139 10/26/2023   HDL 56 10/26/2023   LDLCALC 69 10/26/2023   TRIG 62 10/26/2023    Significant Diagnostic Results in last 30 days:  No results found.  Assessment/Plan Dermatitis Middle of the right thigh, itching irritated red papule where upper rim of the prosthetic leg, will apply 0.1% Triamcinolone  cream+Nystatin cream and covers with Vaseline gauze.    Acute respiratory failure with hypoxia (HCC) Acute respiratory failure with hypoxia, chronic O2 via [place], followed by Pulmonology. On Montelukast , DuoNeb, Symbicort ,   Tessalon   Acute pulmonary embolism (HCC)  on Eliquis   Hypotension Blood pressure is controlled, on midodrine , off Metoprolol   Chronic anemia Vit B12 1285 09/12/23, Iron 42 09/15/23, Hgb 10.1 10/26/23  Slow transit constipation Stable,  on MiraLax   MCI (mild cognitive impairment)  taking Donepezil .   Anxiety and depression Her mood is stable, on Fluoxetine , Wellbutrin . TSH 0.74 10/26/23  History of right below knee amputation (HCC)   Hx of T2DM, s/p R BKA, Hgb 5.8 10/26/23  History of osteoporosis on Prolia , Ca, Vit D, DEXA 09/2022 wnl  Hyperlipidemia taking Rosuvastatin .      Family/ staff Communication: plan of care reviewed with the patient and charge nurse.   Labs/tests ordered:  none

## 2023-11-14 NOTE — Assessment & Plan Note (Signed)
 Vit B12 1285 09/12/23, Iron 42 09/15/23, Hgb 10.1 10/26/23

## 2023-11-14 NOTE — Assessment & Plan Note (Signed)
 Middle of the right thigh, itching irritated red papule where upper rim of the prosthetic leg, will apply 0.1% Triamcinolone  cream+Nystatin cream and covers with Vaseline gauze.

## 2023-11-14 NOTE — Assessment & Plan Note (Signed)
 Hx of T2DM, s/p R BKA, Hgb 5.8 10/26/23

## 2023-11-15 ENCOUNTER — Encounter: Payer: Self-pay | Admitting: Nurse Practitioner

## 2023-11-15 DIAGNOSIS — R41841 Cognitive communication deficit: Secondary | ICD-10-CM | POA: Diagnosis not present

## 2023-11-15 DIAGNOSIS — R2689 Other abnormalities of gait and mobility: Secondary | ICD-10-CM | POA: Diagnosis not present

## 2023-11-15 DIAGNOSIS — R29898 Other symptoms and signs involving the musculoskeletal system: Secondary | ICD-10-CM | POA: Diagnosis not present

## 2023-11-15 DIAGNOSIS — M6281 Muscle weakness (generalized): Secondary | ICD-10-CM | POA: Diagnosis not present

## 2023-11-15 DIAGNOSIS — M5416 Radiculopathy, lumbar region: Secondary | ICD-10-CM | POA: Diagnosis not present

## 2023-11-15 DIAGNOSIS — M51362 Other intervertebral disc degeneration, lumbar region with discogenic back pain and lower extremity pain: Secondary | ICD-10-CM | POA: Diagnosis not present

## 2023-11-15 DIAGNOSIS — M25811 Other specified joint disorders, right shoulder: Secondary | ICD-10-CM | POA: Diagnosis not present

## 2023-11-15 DIAGNOSIS — M159 Polyosteoarthritis, unspecified: Secondary | ICD-10-CM | POA: Insufficient documentation

## 2023-11-16 DIAGNOSIS — R29898 Other symptoms and signs involving the musculoskeletal system: Secondary | ICD-10-CM | POA: Diagnosis not present

## 2023-11-16 DIAGNOSIS — R41841 Cognitive communication deficit: Secondary | ICD-10-CM | POA: Diagnosis not present

## 2023-11-16 DIAGNOSIS — R2689 Other abnormalities of gait and mobility: Secondary | ICD-10-CM | POA: Diagnosis not present

## 2023-11-16 DIAGNOSIS — M6281 Muscle weakness (generalized): Secondary | ICD-10-CM | POA: Diagnosis not present

## 2023-11-17 DIAGNOSIS — R29898 Other symptoms and signs involving the musculoskeletal system: Secondary | ICD-10-CM | POA: Diagnosis not present

## 2023-11-17 DIAGNOSIS — R41841 Cognitive communication deficit: Secondary | ICD-10-CM | POA: Diagnosis not present

## 2023-11-17 DIAGNOSIS — M6281 Muscle weakness (generalized): Secondary | ICD-10-CM | POA: Diagnosis not present

## 2023-11-17 DIAGNOSIS — R2689 Other abnormalities of gait and mobility: Secondary | ICD-10-CM | POA: Diagnosis not present

## 2023-11-20 DIAGNOSIS — R41841 Cognitive communication deficit: Secondary | ICD-10-CM | POA: Diagnosis not present

## 2023-11-20 DIAGNOSIS — R2689 Other abnormalities of gait and mobility: Secondary | ICD-10-CM | POA: Diagnosis not present

## 2023-11-20 DIAGNOSIS — M6281 Muscle weakness (generalized): Secondary | ICD-10-CM | POA: Diagnosis not present

## 2023-11-20 DIAGNOSIS — R29898 Other symptoms and signs involving the musculoskeletal system: Secondary | ICD-10-CM | POA: Diagnosis not present

## 2023-11-21 DIAGNOSIS — R29898 Other symptoms and signs involving the musculoskeletal system: Secondary | ICD-10-CM | POA: Diagnosis not present

## 2023-11-21 DIAGNOSIS — R2689 Other abnormalities of gait and mobility: Secondary | ICD-10-CM | POA: Diagnosis not present

## 2023-11-21 DIAGNOSIS — R41841 Cognitive communication deficit: Secondary | ICD-10-CM | POA: Diagnosis not present

## 2023-11-21 DIAGNOSIS — M6281 Muscle weakness (generalized): Secondary | ICD-10-CM | POA: Diagnosis not present

## 2023-11-22 ENCOUNTER — Encounter: Payer: Self-pay | Admitting: Nurse Practitioner

## 2023-11-22 ENCOUNTER — Ambulatory Visit: Admitting: Nurse Practitioner

## 2023-11-22 VITALS — BP 108/56 | HR 93 | Ht 65.0 in | Wt 150.6 lb

## 2023-11-22 DIAGNOSIS — J9611 Chronic respiratory failure with hypoxia: Secondary | ICD-10-CM

## 2023-11-22 DIAGNOSIS — R2689 Other abnormalities of gait and mobility: Secondary | ICD-10-CM | POA: Diagnosis not present

## 2023-11-22 DIAGNOSIS — I2699 Other pulmonary embolism without acute cor pulmonale: Secondary | ICD-10-CM

## 2023-11-22 DIAGNOSIS — M6281 Muscle weakness (generalized): Secondary | ICD-10-CM | POA: Diagnosis not present

## 2023-11-22 DIAGNOSIS — R29898 Other symptoms and signs involving the musculoskeletal system: Secondary | ICD-10-CM | POA: Diagnosis not present

## 2023-11-22 DIAGNOSIS — J849 Interstitial pulmonary disease, unspecified: Secondary | ICD-10-CM

## 2023-11-22 DIAGNOSIS — J45991 Cough variant asthma: Secondary | ICD-10-CM | POA: Diagnosis not present

## 2023-11-22 DIAGNOSIS — R41841 Cognitive communication deficit: Secondary | ICD-10-CM | POA: Diagnosis not present

## 2023-11-22 DIAGNOSIS — J189 Pneumonia, unspecified organism: Secondary | ICD-10-CM | POA: Diagnosis not present

## 2023-11-22 MED ORDER — BENZONATATE 200 MG PO CAPS: 200.0000 mg | ORAL_CAPSULE | Freq: Three times a day (TID) | ORAL | 1 refills | Status: DC | PRN

## 2023-11-22 MED ORDER — PREDNISONE 20 MG PO TABS: 20.0000 mg | ORAL_TABLET | Freq: Every day | ORAL | 0 refills | Status: AC

## 2023-11-22 NOTE — Patient Instructions (Signed)
 Continue Symbicort  2 puffs Twice daily. Brush tongue and rinse mouth afterwards. Use with spacer Continue Albuterol  inhaler 2 puffs every 6 hours as needed for shortness of breath or wheezing. Notify if symptoms persist despite rescue inhaler/neb use.  Continue apixiban 5 mg Twice daily. Monitor for any bleeding or excessive bruising Continue montelukast  1 tab daily  Prednisone  20 mg daily for 5 days. Take in AM with food.  Benzonatate  1 capsule Three times a day as needed for cough   Repeat CT chest in next 2-4 weeks.  Orders placed for oxygen  Continue supplemental oxygen  2-3 lpm with activity and 2 lpm at night for goal >88-90%   Follow up in 6-8 weeks with Dr. Waymond Hailey to review CT scan. If symptoms do not improve or worsen, please contact office for sooner follow up or seek emergency care.

## 2023-11-22 NOTE — Progress Notes (Addendum)
 @Patient  ID: Shelly [name], female    DOB: [birth year], 73 y.o.   MRN: [id number]  Chief Complaint  Patient presents with   Follow-up    Hospital follow up    Referring provider: Faustina Hood, MD  HPI: 73 year old female, former smoker followed for cough variant asthma. She is a patient of Dr. Jacqui Mau and last seen in office 02/21/2022. Past medical history significant for HTN, GERD, fibromyalgia.   TEST/EVENTS:  05/2014 allergy  profile > no eos, IgE 424, positive RAST to dust and mold 05/2014 CT sinus chest > sinuses nl; calcified granuloma in the LLL 01/2019 CTA chest:  no evidence of PE. No LAD. Calcified mass in the LLL, likely benign and stable since 2015 09/11/2023 CTA chest: LML and LUL PE; mild clot burden. Patulous esophagus. Some calcified nodes. Several enlarged mediastinal nodes, reactice. Extensive patchy areas of ground glass. Diffuse distribution in left lung, less areas in the right lung. New from prior CT. Some enlargement of PA.  09/14/2023 swallow study: grossly WFL. Pt displayed consistent delayed swallow. Oral lesions. Recommend dysphagia 3 diet and thin liquids   05/03/2023: OV with Dr. Waymond Hailey. Cough variant asthma. Maintained on Symbicort . Cough onset about a week but comes and goes. Productive dark green. Omnicef  and depo. Refer to allergy . Depo inj.   11/22/2023: Today - hospital follow up Discussed the use of AI scribe software for clinical note transcription with the patient, who gave verbal consent to proceed.  History of Present Illness   Shelly [name] is a 73 year old female who presents for hospital follow up. She was admitted late March for rhinovirus with pneumonia and acute respiratory failure. She was discharged 4/2 on supplemental oxygen .  She was then readmitted 4/7 and had prolonged hospitalization through 4/26 for worsening hypoxic respiratory failure.   CTA chest showed multiple PE in PE with mild clot burden, worsened b/l GGO opacities.  Possible infectious process vs inflammatory/underlying ILD vs edema component. Received empiric abx, steroids, and diuretic course. She was treated with HFNC and eventually tapered to 2-3 lpm at rest and 3-4 lpm with exertion prior to discharge to SNF on 4/26.   During her admission, she also had swallow study that revealed some delayed swallowing and was put on dysphagia 3 (mechanical soft) and thin liquid diet.   Overall, she is doing much better. She experiences breathlessness, particularly when moving without oxygen , and is currently residing in a skilled nursing facility, with plans to move to the assisted living side. Needs an updated O2 order.   She uses an inhaler twice daily, taking two puffs in the morning and two in the evening. The inhaler is described as red, likely indicating Symbicort . She experiences coughing after inhaler use, which she attributes to the need to take deep breaths. She does not use a spacer with her inhaler.  She receives breathing treatments a couple of times a week, with increased frequency during respiratory distress, and finds them helpful. She occasionally produces phlegm when coughing, but it is not excessive or concerning in color.  She is unsure about her current use of blood thinners, which were prescribed following a blood clot identified during her hospitalization. Her medication is managed by the staff at her current residence.         Allergies  Allergen Reactions   Delsym [Dextromethorphan] Other (See Comments)    Cognitive impairment    Immunization History  Administered Date(s) Administered   Influenza Split 02/06/2011, 02/24/2011, 03/05/2013,  04/06/2014   Influenza, High Dose Seasonal PF 03/08/2021   Influenza,inj,Quad PF,6+ Mos 04/24/2015   Influenza,inj,quad, With Preservative 04/27/2017   Influenza-Unspecified 02/22/2011, 02/19/2012, 05/13/2016, 05/02/2017, 05/22/2018, 03/11/2019, 03/03/2020, 03/04/2021, 04/11/2022   PFIZER  Comirnaty(Gray Top)Covid-19 Tri-Sucrose Vaccine 02/05/2023   PFIZER(Purple Top)SARS-COV-2 Vaccination 06/23/2019, 07/13/2019, 03/08/2020   PNEUMOCOCCAL CONJUGATE-20 09/24/2021   Pneumococcal Conjugate-13 11/15/2013   Pneumococcal Polysaccharide-23 12/25/2014   Tdap 04/11/2011, 09/24/2021, 06/25/2023   Unspecified SARS-COV-2 Vaccination 05/21/2021   Zoster Recombinant(Shingrix) 09/28/2017, 02/01/2018   Zoster, Live 05/07/2012, 09/28/2017, 02/01/2018    Past Medical History:  Diagnosis Date   Anxiety    Arthritis    Asthma    Avascular necrosis of talus (HCC)    Cancer (HCC)    vulva pre cancer had surgery   Depression    Gait abnormality 03/20/2017   GERD (gastroesophageal reflux disease)    Hearing loss    very minor   History of pneumonia    Hyperlipidemia    Hypertension    Leg edema    left   Memory difficulty 03/20/2017   PONV (postoperative nausea and vomiting)    Stress incontinence     Tobacco History: Social History   Tobacco Use  Smoking Status Former   Current packs/day: 0.00   Average packs/day: 1 pack/day for 40.0 years (40.0 ttl pk-yrs)   Types: Cigarettes   Start date: 02/05/1967   Quit date: 02/05/2007   Years since quitting: 16.8  Smokeless Tobacco Never   Counseling given: Not Answered   Outpatient Medications Prior to Visit  Medication Sig Dispense Refill   acyclovir cream (ZOVIRAX) 5 % Apply 1 Application topically 2 (two) times daily as needed (cold sore).     albuterol  (PROAIR  HFA) 108 (90 Base) MCG/ACT inhaler 2 puffs every 4 hours as needed only  if your can't catch your breath 18 g 11   apixaban  (ELIQUIS ) 5 MG TABS tablet Take 1 tablet (5 mg total) by mouth 2 (two) times daily.     benzonatate  (TESSALON ) 200 MG capsule Take 1 capsule (200 mg total) by mouth 3 (three) times daily as needed for cough. TAKE 1 CAPSULE(200 MG) BY MOUTH THREE TIMES DAILY AS NEEDED FOR COUGH 30 capsule 1   budesonide -formoterol  (SYMBICORT ) 160-4.5 MCG/ACT inhaler  Inhale 2 puffs into the lungs in the morning and at bedtime. 1 each 12   buPROPion  (WELLBUTRIN  XL) 300 MG 24 hr tablet Take 300 mg by mouth daily.     calcium  carbonate (OSCAL) 1500 (600 Ca) MG TABS tablet Take 600 mg of elemental calcium  by mouth at bedtime.     denosumab  (PROLIA ) 60 MG/ML SOLN injection Inject 60 mg into the skin every 6 (six) months.      donepezil  (ARICEPT ) 10 MG tablet Take 1 tablet (10 mg total) by mouth at bedtime. 30 tablet 5   FLUoxetine  (PROZAC ) 20 MG capsule Take 20 mg by mouth daily.     ipratropium-albuterol  (DUONEB) 0.5-2.5 (3) MG/3ML SOLN Take 3 mLs by nebulization every 6 (six) hours as needed (Shortness of breath, cough and/or wheeze). Use this or albuterol  inhaler but not both. 360 mL 1   MAGNESIUM  GLYCINATE PO Take 1 tablet by mouth at bedtime. 200 mg     melatonin 3 MG TABS tablet Take 3 mg by mouth at bedtime.     midodrine  (PROAMATINE ) 5 MG tablet Take 1 tablet (5 mg total) by mouth 3 (three) times daily with meals.     montelukast  (SINGULAIR ) 10 MG tablet TAKE 1  TABLET(10 MG) BY MOUTH DAILY 90 tablet 1   Multiple Vitamins-Minerals (CENTRUM SILVER WOMEN 50+) TABS Take 1 tablet by mouth daily.     ondansetron  (ZOFRAN -ODT) 4 MG disintegrating tablet Take 1 tablet (4 mg total) by mouth every 8 (eight) hours as needed for vomiting or nausea.     OXYGEN  Inhale into the lungs. O2 2L, Monitor O2 sat Qshift to maintain O2 Sat >95% every shift     pantoprazole  (PROTONIX ) 40 MG tablet TAKE 30 TO 60 MINUTES BEFORE THE FIRST AND LAST MEAL OF THE DAY 180 tablet 0   polyethylene glycol (MIRALAX  / GLYCOLAX ) 17 g packet Take 17 g by mouth daily. 1 scoop every 24 hours as needed for constipation     Potassium 99 MG TABS Take 99 mg by mouth at bedtime.     rosuvastatin  (CRESTOR ) 10 MG tablet Take 1 tablet (10 mg total) by mouth every evening. 90 tablet 3   TURMERIC CURCUMIN PO Take 1,500 mg by mouth daily.     valACYclovir (VALTREX) 1000 MG tablet Take 1,000 mg by mouth as  needed. Continue until cold sores resolve.     No facility-administered medications prior to visit.     Review of Systems:   Constitutional: No weight loss or gain, night sweats, fevers, chills, fatigue, or lassitude. HEENT: No headaches, difficulty swallowing, tooth/dental problems, or sore throat. No sneezing, itching, ear ache. +nasal congestion, post nasal drip CV:  No chest pain, orthopnea, PND, swelling in lower extremities, anasarca, dizziness, palpitations, syncope Resp: +shortness of breath with exertion; minimally productive cough; rare wheeze. No excess mucus or change in color of mucus. No productive or non-productive. No hemoptysis. No chest wall deformity GI:  No heartburn, indigestion, abdominal pain, nausea, vomiting, diarrhea, change in bowel habits, loss of appetite, bloody stools.  Skin: No rash, lesions, ulcerations MSK:  No joint pain or swelling.  No decreased range of motion.  No back pain. Neuro: No dizziness or lightheadedness.  Psych: No depression or anxiety. Mood stable.     Physical Exam:  LMP  (LMP Unknown)   GEN: Pleasant, interactive, well-appearing; obese; in no acute distress HEENT:  Normocephalic and atraumatic. PERRLA. Sclera white. Nasal turbinates pink, moist and patent bilaterally. No rhinorrhea present. Oropharynx erythematous and moist, without exudate or edema. No lesions, ulcerations NECK:  Supple w/ fair ROM. No JVD present. Normal carotid impulses w/o bruits. Thyroid  symmetrical with no goiter or nodules palpated. No lymphadenopathy.   CV: RRR, no m/r/g, no peripheral edema. Pulses intact, +2 bilaterally. No cyanosis, pallor or clubbing. PULMONARY:  Unlabored, regular breathing. Clear bilaterally A&P w/o wheezes/rales/rhonchi. No accessory muscle use.  GI: BS present and normoactive. Soft, non-tender to palpation. No organomegaly or masses detected.  MSK: No erythema, warmth or tenderness. Cap refil <2 sec all extrem. No deformities or joint  swelling noted.  Neuro: A/Ox3. No focal deficits noted.   Skin: Warm, no lesions or rashe Psych: Normal affect and behavior. Judgement and thought content appropriate.     Lab Results:  CBC    Component Value Date/Time   WBC 6.0 10/26/2023 0000   WBC 16.1 (H) 09/28/2023 0450   RBC 3.39 (A) 10/26/2023 0000   HGB 10.1 (A) 10/26/2023 0000   HCT 33 (A) 10/26/2023 0000   PLT 327 10/26/2023 0000   MCV 95.4 09/28/2023 0450   MCH 31.4 09/28/2023 0450   MCHC 32.9 09/28/2023 0450   RDW 13.9 09/28/2023 0450   LYMPHSABS 2.3 09/28/2023 0450   MONOABS  0.9 09/28/2023 0450   EOSABS 0.1 09/28/2023 0450   BASOSABS 0.0 09/28/2023 0450    BMET    Component Value Date/Time   NA 140 10/26/2023 0000   K 4.3 10/26/2023 0000   CL 103 10/26/2023 0000   CO2 31 (A) 10/26/2023 0000   GLUCOSE 92 09/28/2023 0450   BUN 15 10/26/2023 0000   CREATININE 0.6 10/26/2023 0000   CREATININE 0.69 09/28/2023 0450   CREATININE 0.83 02/12/2015 0847   CALCIUM  8.7 10/26/2023 0000   GFRNONAA >60 09/28/2023 0450   GFRAA >60 01/11/2019 1432    BNP    Component Value Date/Time   BNP 20.9 09/28/2023 0450     Imaging:  No results found.  Administration History     None           No data to display          No results found for: NITRICOXIDE      Assessment & Plan:   No problem-specific Assessment & Plan notes found for this encounter.    I spent 28 minutes of dedicated to the care of this patient on the date of this encounter to include pre-visit review of records, face-to-face time with the patient discussing conditions above, post visit ordering of testing, clinical documentation with the electronic health record, making appropriate referrals as documented, and communicating necessary findings to members of the patients care team.  Roetta Clarke, NP 11/22/2023  Pt aware and understands NP's role.

## 2023-11-23 ENCOUNTER — Encounter: Payer: Self-pay | Admitting: Nurse Practitioner

## 2023-11-23 ENCOUNTER — Telehealth: Payer: Self-pay

## 2023-11-23 DIAGNOSIS — R2689 Other abnormalities of gait and mobility: Secondary | ICD-10-CM | POA: Diagnosis not present

## 2023-11-23 DIAGNOSIS — R41841 Cognitive communication deficit: Secondary | ICD-10-CM | POA: Diagnosis not present

## 2023-11-23 DIAGNOSIS — R29898 Other symptoms and signs involving the musculoskeletal system: Secondary | ICD-10-CM | POA: Diagnosis not present

## 2023-11-23 DIAGNOSIS — M6281 Muscle weakness (generalized): Secondary | ICD-10-CM | POA: Diagnosis not present

## 2023-11-23 DIAGNOSIS — J849 Interstitial pulmonary disease, unspecified: Secondary | ICD-10-CM | POA: Insufficient documentation

## 2023-11-23 NOTE — Telephone Encounter (Signed)
 Copied from CRM [id number]. Topic: General - Other >> Nov 22, 2023  4:13 PM [place] S wrote: Reason for CRM: amanda from [facility] is calling to ask if cobb, katherine ordered a Tourist information centre manager for patient because patient would need the portable one for when she goes outside. They need to know so they can plan her release, please call at [phone] ext [phone].    Tried to call back. Had to leave message for call back ( order is still pending since it was order just the day before at her office visit, we only placed the orders, so if needing to know status she needs to call the DME).

## 2023-11-24 ENCOUNTER — Encounter: Payer: Self-pay | Admitting: Nurse Practitioner

## 2023-11-24 ENCOUNTER — Telehealth: Payer: Self-pay | Admitting: Nurse Practitioner

## 2023-11-24 DIAGNOSIS — M6281 Muscle weakness (generalized): Secondary | ICD-10-CM | POA: Diagnosis not present

## 2023-11-24 DIAGNOSIS — R29898 Other symptoms and signs involving the musculoskeletal system: Secondary | ICD-10-CM | POA: Diagnosis not present

## 2023-11-24 DIAGNOSIS — R41841 Cognitive communication deficit: Secondary | ICD-10-CM | POA: Diagnosis not present

## 2023-11-24 DIAGNOSIS — R2689 Other abnormalities of gait and mobility: Secondary | ICD-10-CM | POA: Diagnosis not present

## 2023-11-24 NOTE — Assessment & Plan Note (Signed)
 Slight exacerbation with increased SABA use. No recurrent infectious symptoms. Will treat her with prednisone  burst and cough control measures. Continue bronchodilator therapy. Action plan in place. Advised on strict return precautions.

## 2023-11-24 NOTE — Telephone Encounter (Signed)
 She desaturated to 87% on room air. That is criteria for O2. It's documented in my note and in the snapshot.

## 2023-11-24 NOTE — Telephone Encounter (Signed)
 Per Devra Fontana at [facility] The sats will not qualify the patient  We will need:  Room air at rest: Room air while ambulating: Liter flow they recovered on:  If the patient desat 88% or below on room air at rest that's all that needs to be documented. Just worded with room air at rest

## 2023-11-24 NOTE — Telephone Encounter (Signed)
 I have sent this information over and will keep you informed.

## 2023-11-24 NOTE — Telephone Encounter (Addendum)
 I'm not sure what they're talking about. Her qualifying walk is in the chart. The order is in for a stationary unit and portable. She just needs a best fit portable eval for POC, which they have always done.

## 2023-11-24 NOTE — Assessment & Plan Note (Signed)
 Ongoing O2 requirement. Unable to maintain saturations >88% on room air. Continue supplemental O2 2-3 lpm. Goal >88-90%. Will send order for best fit portable and POC eval. Home O2 concentrator order placed.  SATURATION QUALIFICATIONS: (This note is used to comply with regulatory documentation for home oxygen )   Patient Saturations on 2L at Rest = 100%   Patient Saturations on 2L while Ambulating = 87%   Patient Saturations on 3 Liters of oxygen  while Ambulating = 91%   Please briefly explain why patient needs home oxygen :   Patient needs to be on 3L while ambulating to maintain above 88%,maintained well on 2L at rest   Patient Instructions  Continue Symbicort  2 puffs Twice daily. Brush tongue and rinse mouth afterwards. Use with spacer Continue Albuterol  inhaler 2 puffs every 6 hours as needed for shortness of breath or wheezing. Notify if symptoms persist despite rescue inhaler/neb use.  Continue apixiban 5 mg Twice daily. Monitor for any bleeding or excessive bruising Continue montelukast  1 tab daily  Prednisone  20 mg daily for 5 days. Take in AM with food.  Benzonatate  1 capsule Three times a day as needed for cough   Repeat CT chest in next 2-4 weeks.  Orders placed for oxygen  Continue supplemental oxygen  2-3 lpm with activity and 2 lpm at night for goal >88-90%   Follow up in 6-8 weeks with Dr. Waymond Hailey to review CT scan. If symptoms do not improve or worsen, please contact office for sooner follow up or seek emergency care.

## 2023-11-24 NOTE — Assessment & Plan Note (Signed)
 Compliant with chronic AC. Provoked PE. Could consider discontinuation at 6 months. Will reassess then.

## 2023-11-24 NOTE — Telephone Encounter (Signed)
 Per Dolanda at [facility]-  we cannot evaluate and qualify patient for stationary oxygen  homefill they have to be evaluated in doctor office or hospital with qualifing saturations and an order for o2. We do evaluate for a poc but they have to be an active O2 patient with us  for them to do that or have already been tested at md office for a poc and we would need the results. This patient is not an active O2 patient so we would need sats and an order for oxygen  which you can use the O2 template to submit. What was sent to me was an order for a poc evaluation which we can't do

## 2023-11-24 NOTE — Telephone Encounter (Signed)
 Noted thanks

## 2023-11-24 NOTE — Assessment & Plan Note (Signed)
 Acute respiratory failure secondary to CAP/rhinovirus. Possible component of underlying ILD vs edema. Will repeat CT imaging to reassess for underlying disease/ensure resolution. Clinically improved. Still has ongoing oxygen  requirement. Suspect related to extent of disease, prolonged hospitalization, deconditioning. Strict return precautions.

## 2023-11-24 NOTE — Assessment & Plan Note (Signed)
 See above

## 2023-11-24 NOTE — Telephone Encounter (Signed)
 I have researched this. I think the confusion is coming in play because there were two orders placed.   PCC's- patient was qualified in office for 3L o2. POC eval is needed.

## 2023-11-27 ENCOUNTER — Telehealth: Payer: Self-pay

## 2023-11-27 ENCOUNTER — Encounter: Payer: Self-pay | Admitting: Sports Medicine

## 2023-11-27 ENCOUNTER — Non-Acute Institutional Stay (SKILLED_NURSING_FACILITY): Payer: Self-pay | Admitting: Sports Medicine

## 2023-11-27 DIAGNOSIS — F0280 Dementia in other diseases classified elsewhere without behavioral disturbance: Secondary | ICD-10-CM | POA: Diagnosis not present

## 2023-11-27 DIAGNOSIS — R29898 Other symptoms and signs involving the musculoskeletal system: Secondary | ICD-10-CM | POA: Diagnosis not present

## 2023-11-27 DIAGNOSIS — J849 Interstitial pulmonary disease, unspecified: Secondary | ICD-10-CM

## 2023-11-27 DIAGNOSIS — J45991 Cough variant asthma: Secondary | ICD-10-CM | POA: Diagnosis not present

## 2023-11-27 DIAGNOSIS — I959 Hypotension, unspecified: Secondary | ICD-10-CM

## 2023-11-27 DIAGNOSIS — G309 Alzheimer's disease, unspecified: Secondary | ICD-10-CM

## 2023-11-27 DIAGNOSIS — J9611 Chronic respiratory failure with hypoxia: Secondary | ICD-10-CM

## 2023-11-27 DIAGNOSIS — I2699 Other pulmonary embolism without acute cor pulmonale: Secondary | ICD-10-CM | POA: Diagnosis not present

## 2023-11-27 DIAGNOSIS — R2689 Other abnormalities of gait and mobility: Secondary | ICD-10-CM | POA: Diagnosis not present

## 2023-11-27 DIAGNOSIS — M6281 Muscle weakness (generalized): Secondary | ICD-10-CM | POA: Diagnosis not present

## 2023-11-27 DIAGNOSIS — R41841 Cognitive communication deficit: Secondary | ICD-10-CM | POA: Diagnosis not present

## 2023-11-27 MED ORDER — MONTELUKAST SODIUM 10 MG PO TABS: 10.0000 mg | ORAL_TABLET | Freq: Every day | ORAL | 0 refills | Status: DC

## 2023-11-27 MED ORDER — FLUOXETINE HCL 20 MG PO CAPS: 20.0000 mg | ORAL_CAPSULE | Freq: Every day | ORAL | 0 refills | Status: DC

## 2023-11-27 MED ORDER — ROSUVASTATIN CALCIUM 10 MG PO TABS: 10.0000 mg | ORAL_TABLET | Freq: Every evening | ORAL | 0 refills | Status: DC

## 2023-11-27 MED ORDER — POTASSIUM 99 MG PO TABS: 99.0000 mg | ORAL_TABLET | Freq: Every day | ORAL | 0 refills | Status: DC

## 2023-11-27 MED ORDER — PANTOPRAZOLE SODIUM 40 MG PO TBEC: DELAYED_RELEASE_TABLET | ORAL | 0 refills | Status: DC

## 2023-11-27 MED ORDER — CALCIUM CARBONATE 1500 (600 CA) MG PO TABS: 600.0000 mg | ORAL_TABLET | Freq: Every day | ORAL | 0 refills | Status: AC

## 2023-11-27 MED ORDER — DONEPEZIL HCL 10 MG PO TABS: 10.0000 mg | ORAL_TABLET | Freq: Every day | ORAL | 0 refills | Status: DC

## 2023-11-27 MED ORDER — BENZONATATE 200 MG PO CAPS: 200.0000 mg | ORAL_CAPSULE | Freq: Three times a day (TID) | ORAL | 0 refills | Status: DC | PRN

## 2023-11-27 MED ORDER — ALBUTEROL SULFATE HFA 108 (90 BASE) MCG/ACT IN AERS: INHALATION_SPRAY | RESPIRATORY_TRACT | 0 refills | Status: DC

## 2023-11-27 MED ORDER — TRAMADOL HCL 50 MG PO TABS: 50.0000 mg | ORAL_TABLET | Freq: Every day | ORAL | 0 refills | Status: DC | PRN

## 2023-11-27 MED ORDER — APIXABAN 5 MG PO TABS: 5.0000 mg | ORAL_TABLET | Freq: Two times a day (BID) | ORAL | 0 refills | Status: DC

## 2023-11-27 MED ORDER — MIDODRINE HCL 5 MG PO TABS: 5.0000 mg | ORAL_TABLET | Freq: Every day | ORAL | 0 refills | Status: DC

## 2023-11-27 NOTE — Telephone Encounter (Signed)
 Shelly [name] Shelly [name] Shelly [name], Shelly [name], Shelly [name] The sats will not qualify the patient  We will need:  Room air at rest: Room air while ambulating: Liter flow they recovered on:  If the patient desat 88% or below on room air at rest that's all that needs to be documented. Just worded with room air at rest  Thank you.

## 2023-11-27 NOTE — Telephone Encounter (Signed)
 Copied from CRM [id number]. Topic: Clinical - Order For Equipment >> Nov 27, 2023  9:09 AM Nathanel DEL wrote: Reason for CRM: Lonell w/ skilled nursing calling to ask if you can resend to [facility] the pt's O2 orders.  As of today they have not received.   Pt cannot go to independent living until she gets her O2. For concentrator and portable. Lonell states she has called everyday since last Wed when the order was sent and they still do not have. Lonell  [phone]  ext [phone]  A qualifying walk was done on 11-22-23. Routing to [facility] to make sure [facility] has this and pt O2 is not delayed any longer.

## 2023-11-27 NOTE — Progress Notes (Signed)
 Location:  [facility]  Nursing Home Room Number: 20A Place of Service:  SNF (31)  Provider: [name], MD   PCP: Claudene Pellet, MD Patient Care Team: Claudene Pellet, MD as PCP - General (Family Medicine) Darron Deatrice LABOR, MD as PCP - Cardiology (Cardiology) Bonner Ade, MD as Consulting Physician (Physical Medicine and Rehabilitation)  Extended Emergency Contact Information Primary Emergency Contact: May,Brian Address: [address]          [place], [place] [address] [place] Home Phone: [phone] Work Phone: [phone] Mobile Phone: [phone] Relation: Brother Secondary Emergency Contact: graham,marisa Mobile Phone: [phone] Relation: Daughter  Code Status: * Full Code  Goals of care:  Advanced Directive information    11/27/2023   10:48 AM  Advanced Directives  Does Patient Have a Medical Advance Directive? Yes  Type of Estate agent of [place];Living will  Does patient want to make changes to medical advance directive? No - Patient declined  Copy of Healthcare Power of Attorney in Chart? No - copy requested  Would patient like information on creating a medical advance directive? No - Patient declined     Allergies  Allergen Reactions   Delsym [Dextromethorphan] Other (See Comments)    Cognitive impairment    Chief Complaint  Patient presents with   Discharge for SNF    Patient is being discharged from SNF     HPI:  73 y.o. female with past medical history of pulmonary embolism, anemia, constipation, mild cognitive impairment, depression, GERD, osteoporosis, hyperlipidemia is evaluated for discharge from the SNF. Patient seen and examined in her room.  She seems pleasant and comfortable and does not appear to be in distress.  She is currently on supplemental care.  She is able to speak in full sentences. She was recently seen evaluated by her pulmonologist.  She is waiting for a  portable oxygen  concentrator to be delivered but [facility] for discharge. Patient denies headache, nausea, vomiting, chest pain, abdominal pain, nausea, vomiting, dysuria, hematuria, bloody or dark-colored stools. She complains of exertional shortness of breath but no recent change.  She is able to transfer independently from bed to chair.  She is able to ambulate with a walker.     Past Medical History:  Diagnosis Date   Anxiety    Arthritis    Asthma    Avascular necrosis of talus (HCC)    Cancer (HCC)    vulva pre cancer had surgery   Depression    Gait abnormality 03/20/2017   GERD (gastroesophageal reflux disease)    Hearing loss    very minor   History of pneumonia    Hyperlipidemia    Hypertension    Leg edema    left   Memory difficulty 03/20/2017   PONV (postoperative nausea and vomiting)    Stress incontinence     Past Surgical History:  Procedure Laterality Date   ANKLE FUSION  2011   right multiple    ANKLE FUSION     rear ankle fusion   ANKLE SURGERY  2010   right cordicompression   AORTIC ARCH ANGIOGRAPHY N/A 12/11/2017   Procedure: AORTIC ARCH ANGIOGRAPHY;  Surgeon: Harvey Carlin BRAVO, MD;  Location: [facility];  Service: Cardiovascular;  Laterality: N/A;   APPENDECTOMY  05/10/2016   BELOW KNEE LEG AMPUTATION     CATARACT EXTRACTION W/ INTRAOCULAR LENS IMPLANT Left    LAPAROSCOPIC APPENDECTOMY N/A 05/10/2016   Procedure: APPENDECTOMY LAPAROSCOPIC;  Surgeon: Vicenta Poli, MD;  Location: [facility] OR;  Service: General;  Laterality: N/A;   LUMBAR LAMINECTOMY/DECOMPRESSION MICRODISCECTOMY N/A 09/09/2015   Procedure:  L4-S1 Decompression/ Discetomy;  Surgeon: Donaciano Sprang, MD;  Location: [facility] OR;  Service: Orthopedics;  Laterality: N/A;   SKIN GRAFT     SPINAL CORD STIMULATOR BATTERY EXCHANGE  06/23/2023   SPINAL CORD STIMULATOR INSERTION N/A 01/10/2019   Procedure: LUMBAR SPINAL CORD STIMULATOR INSERTION;  Surgeon: Sprang Donaciano, MD;  Location:  [facility] OR;  Service: Orthopedics;  Laterality: N/A;  2.5 hrs   TONSILLECTOMY     TUBAL LIGATION  1983   VULVECTOMY N/A 01/28/2015   Procedure: WIDE EXCISION VULVECTOMY;  Surgeon: Hargis Paradise, MD;  Location: [facility] ORS;  Service: Gynecology;  Laterality: N/A;      reports that she quit smoking about 16 years ago. Her smoking use included cigarettes. She started smoking about 56 years ago. She has a 40 pack-year smoking history. She has never used smokeless tobacco. She reports current alcohol  use. She reports that she does not use drugs. Social History   Socioeconomic History   Marital status: Divorced    Spouse name: Not on file   Number of children: 3   Years of education: 14   Highest education level: Not on file  Occupational History   Occupation: ACCOUNTING    Employer: [employer]  Tobacco Use   Smoking status: Former    Current packs/day: 0.00    Average packs/day: 1 pack/day for 40.0 years (40.0 ttl pk-yrs)    Types: Cigarettes    Start date: 02/05/1967    Quit date: 02/05/2007    Years since quitting: 16.8   Smokeless tobacco: Never  Vaping Use   Vaping status: Never Used  Substance and Sexual Activity   Alcohol  use: Yes    Alcohol /week: 0.0 standard drinks of alcohol     Comment: socially   Drug use: No   Sexual activity: Not on file  Other Topics Concern   Not on file  Social History Narrative   Lives alone   Caffeine use: Tea every morning    Right handed    Social Drivers of Health   Financial Resource Strain: Not on file  Food Insecurity: No Food Insecurity (09/11/2023)   Hunger Vital Sign    Worried About Running Out of Food in the Last Year: Never true    Ran Out of Food in the Last Year: Never true  Transportation Needs: No Transportation Needs (09/11/2023)   PRAPARE - Administrator, Civil Service (Medical): No    Lack of Transportation (Non-Medical): No  Physical Activity: Not on file  Stress: Not on file  Social Connections: Moderately  Isolated (09/11/2023)   Social Connection and Isolation Panel    Frequency of Communication with Friends and Family: More than three times a week    Frequency of Social Gatherings with Friends and Family: Once a week    Attends Religious Services: Never    Database administrator or Organizations: No    Attends Banker Meetings: Never    Marital Status: Married  Catering manager Violence: Not At Risk (09/11/2023)   Humiliation, Afraid, Rape, and Kick questionnaire    Fear of Current or Ex-Partner: No    Emotionally Abused: No    Physically Abused: No    Sexually Abused: No   Functional Status Survey:    Allergies  Allergen Reactions   Delsym [Dextromethorphan] Other (See Comments)    Cognitive impairment    Pertinent  Health Maintenance Due  Topic Date Due   FOOT EXAM  Never done   OPHTHALMOLOGY EXAM  Never done   INFLUENZA VACCINE  01/05/2024   HEMOGLOBIN A1C  04/27/2024   Colonoscopy  03/22/2026   DEXA SCAN  Completed    Medications: Allergies as of 11/27/2023       Reactions   Delsym [dextromethorphan] Other (See Comments)   Cognitive impairment        Medication List        Accurate as of November 27, 2023 10:54 AM. If you have any questions, ask your nurse or doctor.          acetaminophen  325 MG suppository Commonly known as: TYLENOL  Place 325 mg rectally every 4 (four) hours as needed for moderate pain (pain score 4-6).   acetaminophen  325 MG suppository Commonly known as: TYLENOL  Place 325 mg rectally every 4 (four) hours as needed for fever.   acyclovir cream 5 % Commonly known as: ZOVIRAX Apply 1 Application topically 2 (two) times daily as needed (cold sore).   albuterol  108 (90 Base) MCG/ACT inhaler Commonly known as: ProAir  HFA 2 puffs every 4 hours as needed only  if your can't catch your breath   apixaban  5 MG Tabs tablet Commonly known as: ELIQUIS  Take 1 tablet (5 mg total) by mouth 2 (two) times daily.   benzonatate  200 MG  capsule Commonly known as: TESSALON  Take 1 capsule (200 mg total) by mouth 3 (three) times daily as needed for cough.   budesonide -formoterol  160-4.5 MCG/ACT inhaler Commonly known as: Symbicort  Inhale 2 puffs into the lungs in the morning and at bedtime.   buPROPion  300 MG 24 hr tablet Commonly known as: WELLBUTRIN  XL Take 300 mg by mouth daily.   calcium  carbonate 1500 (600 Ca) MG Tabs tablet Commonly known as: OSCAL Take 600 mg of elemental calcium  by mouth at bedtime.   Centrum Silver Women 50+ Tabs Take 1 tablet by mouth daily.   denosumab  60 MG/ML Soln injection Commonly known as: PROLIA  Inject 60 mg into the skin every 6 (six) months.   donepezil  10 MG tablet Commonly known as: ARICEPT  Take 1 tablet (10 mg total) by mouth at bedtime.   FLUoxetine  20 MG capsule Commonly known as: PROZAC  Take 20 mg by mouth daily.   ipratropium-albuterol  0.5-2.5 (3) MG/3ML Soln Commonly known as: DUONEB Take 3 mLs by nebulization every 6 (six) hours as needed (Shortness of breath, cough and/or wheeze). Use this or albuterol  inhaler but not both.   MAGNESIUM  GLYCINATE PO Take 1 tablet by mouth at bedtime. 200 mg   melatonin 3 MG Tabs tablet Take 3 mg by mouth at bedtime.   midodrine  5 MG tablet Commonly known as: PROAMATINE  Take 1 tablet (5 mg total) by mouth 3 (three) times daily with meals.   montelukast  10 MG tablet Commonly known as: SINGULAIR  TAKE 1 TABLET(10 MG) BY MOUTH DAILY   nystatin cream Commonly known as: MYCOSTATIN Apply 1 Application topically.   ondansetron  4 MG disintegrating tablet Commonly known as: ZOFRAN -ODT Take 1 tablet (4 mg total) by mouth every 8 (eight) hours as needed for vomiting or nausea.   OXYGEN  Inhale into the lungs. O2 2L, Monitor O2 sat Qshift to maintain O2 Sat >95% every shift   pantoprazole  40 MG tablet Commonly known as: PROTONIX  TAKE 30 TO 60 MINUTES BEFORE THE FIRST AND LAST MEAL OF THE DAY   polyethylene glycol 17 g  packet Commonly known as: MIRALAX  / GLYCOLAX  Take 17  g by mouth daily. 1 scoop every 24 hours as needed for constipation   Potassium 99 MG Tabs Take 99 mg by mouth at bedtime.   predniSONE  20 MG tablet Commonly known as: DELTASONE  Take 1 tablet (20 mg total) by mouth daily with breakfast for 5 days.   rosuvastatin  10 MG tablet Commonly known as: CRESTOR  Take 1 tablet (10 mg total) by mouth every evening.   traMADol  50 MG tablet Commonly known as: ULTRAM  Take 50 mg by mouth 4 (four) times daily as needed.   triamcinolone  cream 0.1 % Commonly known as: KENALOG  Apply 1 Application topically.   TURMERIC CURCUMIN PO Take 1,500 mg by mouth daily.   valACYclovir 1000 MG tablet Commonly known as: VALTREX Take 1,000 mg by mouth as needed. Continue until cold sores resolve.        Review of Systems  Constitutional:  Negative for chills and fever.  HENT:  Negative for sinus pressure and sore throat.   Respiratory:  Positive for cough and shortness of breath (exertional). Negative for wheezing.   Cardiovascular:  Negative for chest pain, palpitations and leg swelling.  Gastrointestinal:  Negative for abdominal distention, abdominal pain, blood in stool, constipation, diarrhea, nausea and vomiting.  Genitourinary:  Negative for dysuria, frequency and urgency.  Neurological:  Negative for dizziness.  Psychiatric/Behavioral:  Negative for confusion.     Vitals:   11/27/23 1050  BP: (!) 117/55  Pulse: 91  Resp: 20  Temp: 97.7 F (36.5 C)  SpO2: 96%  Weight: 146 lb 6.4 oz (66.4 kg)  Height: 5' 5 (1.651 m)   Body mass index is 24.36 kg/m. Physical Exam Constitutional:      Appearance: Normal appearance.  HENT:     Head: Normocephalic and atraumatic.   Cardiovascular:     Rate and Rhythm: Normal rate and regular rhythm.  Pulmonary:     Effort: Pulmonary effort is normal. No respiratory distress.     Breath sounds: Rales (basal lung crackles) present. No wheezing.   Abdominal:     General: Bowel sounds are normal. There is no distension.     Tenderness: There is no abdominal tenderness. There is no guarding or rebound.     Comments:     Musculoskeletal:        General: No swelling or tenderness.   Neurological:     Mental Status: She is alert. Mental status is at baseline.     Motor: No weakness.     Labs reviewed: Basic Metabolic Panel: Recent Labs    09/03/23 0242 09/04/23 0357 09/05/23 0330 09/06/23 0404 09/11/23 0541 09/23/23 0438 09/25/23 0436 09/28/23 0450 10/10/23 0000 10/26/23 0000  NA  --  138 140 139   < > 136 137 135 140 140  K  --  3.2* 3.2* 3.7   < > 4.0 4.2 3.9 4.3 4.3  CL  --  101 104 105   < > 96* 99 96* 103 103  CO2  --  26 27 22    < > 29 32 28 31* 31*  GLUCOSE  --  150* 117* 95   < > 84 81 92  --   --   BUN  --  12 15 16    < > 16 14 20  33* 15  CREATININE  --  0.68 0.66 0.73   < > 0.57 0.62 0.69 0.5 0.6  CALCIUM   --  7.6* 7.8* 7.9*   < > 8.5* 8.9 8.9 8.3* 8.7  MG 1.8 2.0  2.2 1.8  --   --  2.0 1.9  --   --   PHOS 2.7 2.9 2.3*  --   --   --   --   --   --   --    < > = values in this interval not displayed.   Liver Function Tests: Recent Labs    09/03/23 0200 09/05/23 0330 09/06/23 0404 09/11/23 0541 10/26/23 0000  AST 53*  --  42* 29 19  ALT 46*  --  63* 35 15  ALKPHOS 64  --  57 71  --   BILITOT 1.2  --  0.7 0.9  --   PROT 5.9*  --  5.4* 6.0*  --   ALBUMIN  2.5*   < > 2.2* 2.4* 3.2*   < > = values in this interval not displayed.   Recent Labs    04/01/23 1847  LIPASE 14   No results for input(s): AMMONIA in the last 8760 hours. CBC: Recent Labs    09/23/23 0438 09/25/23 0436 09/28/23 0450 10/10/23 0000 10/17/23 0000 10/26/23 0000  WBC 12.9* 13.2* 16.1* 8.9 6.6 6.0  NEUTROABS  --  9.3* 12.6*  --  3,478.00 3,324.00  HGB 9.6* 10.1* 11.0* 10.0* 10.0* 10.1*  HCT 29.7* 31.4* 33.4* 31* 32* 33*  MCV 96.4 96.3 95.4  --   --   --   PLT 310 314 322 234 276 327   Cardiac Enzymes: No results  for input(s): CKTOTAL, CKMB, CKMBINDEX, TROPONINI in the last 8760 hours. BNP: Invalid input(s): POCBNP CBG: Recent Labs    09/29/23 0830 09/29/23 1315 09/29/23 1623  GLUCAP 89 179* 208*    Procedures and Imaging Studies During Stay: No results found.  Assessment/Plan:    1. Cough variant asthma  - benzonatate  (TESSALON ) 200 MG capsule; Take 1 capsule (200 mg total) by mouth 3 (three) times daily as needed for cough.  Dispense: 30 capsule; Refill: 0  2. Chronic respiratory failure with hypoxia (HCC) (Primary) 3. Interstitial lung disease (HCC) Follow up with pulmonology Cont with symbicort , albuterol  prn    4. Alzheimer's dementia, unspecified dementia severity, unspecified timing of dementia onset, unspecified whether behavioral, psychotic, or mood disturbance or anxiety (HCC) Cont with aricept   5. Other pulmonary embolism without acute cor pulmonale, unspecified chronicity (HCC) Cont with apixiban   6.  Hypotension  Cont with midodrine   Other orders - acetaminophen  (TYLENOL ) 325 MG suppository; Place 325 mg rectally every 4 (four) hours as needed for moderate pain (pain score 4-6). - acetaminophen  (TYLENOL ) 325 MG suppository; Place 325 mg rectally every 4 (four) hours as needed for fever. - albuterol  (PROAIR  HFA) 108 (90 Base) MCG/ACT inhaler; 2 puffs every 4 hours as needed only  if your can't catch your breath  Dispense: 18 g; Refill: 0 - apixaban  (ELIQUIS ) 5 MG TABS tablet; Take 1 tablet (5 mg total) by mouth 2 (two) times daily.  Dispense: 60 tablet; Refill: 0 - calcium  carbonate (OSCAL) 1500 (600 Ca) MG TABS tablet; Take 1 tablet (1,500 mg total) by mouth at bedtime.  Dispense: 30 tablet; Refill: 0 - donepezil  (ARICEPT ) 10 MG tablet; Take 1 tablet (10 mg total) by mouth at bedtime.  Dispense: 30 tablet; Refill: 0 - FLUoxetine  (PROZAC ) 20 MG capsule; Take 1 capsule (20 mg total) by mouth daily.  Dispense: 30 capsule; Refill: 0 - midodrine  (PROAMATINE ) 5 MG  tablet; Take 1 tablet (5 mg total) by mouth daily.  Dispense: 30 tablet; Refill: 0 - montelukast  (SINGULAIR )  10 MG tablet; Take 1 tablet (10 mg total) by mouth at bedtime.  Dispense: 30 tablet; Refill: 0 - pantoprazole  (PROTONIX ) 40 MG tablet; TAKE 30 TO 60 MINUTES BEFORE THE FIRST AND LAST MEAL OF THE DAY  Dispense: 60 tablet; Refill: 0 - Potassium 99 MG TABS; Take 1 tablet (99 mg total) by mouth at bedtime.  Dispense: 30 tablet; Refill: 0 - rosuvastatin  (CRESTOR ) 10 MG tablet; Take 1 tablet (10 mg total) by mouth every evening.  Dispense: 30 tablet; Refill: 0 - traMADol  (ULTRAM ) 50 MG tablet; Take 1 tablet (50 mg total) by mouth daily as needed.  Dispense: 5 tablet; Refill: 0        Patient has been advised to f/u with their PCP in 1-2 weeks to bring them up to date on their rehab stay.  Social services at facility was responsible for arranging this appointment.  Pt was provided with a 30 day supply of prescriptions for medications and refills must be obtained from their PCP.        35 min Total time spent for obtaining history,  performing a medically appropriate examination and evaluation, reviewing the tests,   documenting clinical information in the electronic or other health record, discharge planning ,care coordination (not separately reported)

## 2023-11-28 DIAGNOSIS — R41841 Cognitive communication deficit: Secondary | ICD-10-CM | POA: Diagnosis not present

## 2023-11-28 DIAGNOSIS — R29898 Other symptoms and signs involving the musculoskeletal system: Secondary | ICD-10-CM | POA: Diagnosis not present

## 2023-11-28 DIAGNOSIS — R2689 Other abnormalities of gait and mobility: Secondary | ICD-10-CM | POA: Diagnosis not present

## 2023-11-28 DIAGNOSIS — M6281 Muscle weakness (generalized): Secondary | ICD-10-CM | POA: Diagnosis not present

## 2023-11-28 NOTE — Telephone Encounter (Signed)
 Per Avelina at [facility]-  Patient Saturations on 2L at Rest = 100%   Patient Saturations on room air while Ambulating = 87%   Patient Saturations on 3 Liters of oxygen  while Ambulating = 91%   These sats will not work.  We would need what the pt is on Room air at rest. IF that sat is below 88% then we don't need the other two. But if just her on room air while ambulating puts her under 88% then we need all 3 sats.

## 2023-11-28 NOTE — Telephone Encounter (Signed)
 I have updated my note. Can [facility] please share Medicare's requirement that there needs to be a documented oxygen  saturation on room air when a patient had a reading less than 88%? It doesn't change care. At this point, they're delaying care and are declining to provide a patient with necessary durable medical equipment. Please send to an alternative DME company and place as urgent. Thanks.

## 2023-11-28 NOTE — Telephone Encounter (Signed)
 I will. I have sent it to [facility] as urgent

## 2023-11-29 NOTE — Telephone Encounter (Signed)
 Note has been updated

## 2023-11-30 DIAGNOSIS — R29898 Other symptoms and signs involving the musculoskeletal system: Secondary | ICD-10-CM | POA: Diagnosis not present

## 2023-11-30 DIAGNOSIS — R41841 Cognitive communication deficit: Secondary | ICD-10-CM | POA: Diagnosis not present

## 2023-11-30 DIAGNOSIS — R2689 Other abnormalities of gait and mobility: Secondary | ICD-10-CM | POA: Diagnosis not present

## 2023-11-30 DIAGNOSIS — M6281 Muscle weakness (generalized): Secondary | ICD-10-CM | POA: Diagnosis not present

## 2023-11-30 NOTE — Telephone Encounter (Signed)
 Spoke to Shelly [name] with [facility]. He states that a sat of 88% or below qualifies a patient for oxygen . He will discuss this further with his team.

## 2023-12-01 ENCOUNTER — Telehealth: Payer: Self-pay

## 2023-12-01 DIAGNOSIS — R2689 Other abnormalities of gait and mobility: Secondary | ICD-10-CM | POA: Diagnosis not present

## 2023-12-01 DIAGNOSIS — R41841 Cognitive communication deficit: Secondary | ICD-10-CM | POA: Diagnosis not present

## 2023-12-01 DIAGNOSIS — M6281 Muscle weakness (generalized): Secondary | ICD-10-CM | POA: Diagnosis not present

## 2023-12-01 DIAGNOSIS — R29898 Other symptoms and signs involving the musculoskeletal system: Secondary | ICD-10-CM | POA: Diagnosis not present

## 2023-12-01 NOTE — Telephone Encounter (Signed)
 Copied from CRM [id number]. Topic: Clinical - Order For Equipment >> Nov 27, 2023  9:09 AM Nathanel DEL wrote: Reason for CRM: Lonell w/ skilled nursing calling to ask if you can resend to [facility] the pt's O2 orders.  As of today they have not received.   Pt cannot go to independent living until she gets her O2. For concentrator and portable. Lonell states she has called everyday since last Wed when the order was sent and they still do not have. Lonell  [phone]  ext [phone] >> Nov 30, 2023  4:25 PM Leila BROCKS wrote: Patient [phone] states there's been confusion on oxygen  concentrators order and needs to have it clarified. Patient states needs the portable and main oxygen  concentrators sent to [facility] not to [facility]. Lonell [phone] ext [phone] is skilled nurse and is managing the order/issues. Please advise and call back. >> Nov 28, 2023  1:41 PM Russell PARAS wrote: Pt is contacting clinic regarding the order for her oxygen  through [facility]. Reviewed chart and advised that [facility] was waiting on walk test results, which were sent with OV notes but has been resent with order as of 06/23. Pt will advise her care team at the facility she resides.  >> Nov 27, 2023  1:33 PM Charlanne KIDD wrote: Marlon Suleiman sent.  >> Nov 27, 2023 12:54 PM Joesph PARAS wrote: Patient is demanding a call back regarding DME. She wants notice on her orders ASAP. She cannot move into independent living until [facility] has gotten the orders fulfilled. Patient is wanting a name of the leader of [facility] who she can speak to to force them to do what they need to do. [facility] still insists they have not received anything at all, as of this morning. States Lonell (skilled nurse assigned to patient) has been attempting to contact nonstop and as of two calls this morning, they insist they have gotten nothing.   I called and spoke to pt. Pt states she thinks she will get it either today or Monday. Pt states nfn as of right now unless she does not receive her  order.

## 2023-12-01 NOTE — Telephone Encounter (Signed)
 Patient got her 29 from [facility] while in the hospital 09/2023. I spoke with Rosalyn with [employer] and she confirmed she has this order

## 2023-12-05 DIAGNOSIS — R2681 Unsteadiness on feet: Secondary | ICD-10-CM | POA: Diagnosis not present

## 2023-12-05 DIAGNOSIS — R41841 Cognitive communication deficit: Secondary | ICD-10-CM | POA: Diagnosis not present

## 2023-12-05 DIAGNOSIS — R29898 Other symptoms and signs involving the musculoskeletal system: Secondary | ICD-10-CM | POA: Diagnosis not present

## 2023-12-05 DIAGNOSIS — M5459 Other low back pain: Secondary | ICD-10-CM | POA: Diagnosis not present

## 2023-12-05 DIAGNOSIS — M6281 Muscle weakness (generalized): Secondary | ICD-10-CM | POA: Diagnosis not present

## 2023-12-06 DIAGNOSIS — R2681 Unsteadiness on feet: Secondary | ICD-10-CM | POA: Diagnosis not present

## 2023-12-06 DIAGNOSIS — I82452 Acute embolism and thrombosis of left peroneal vein: Secondary | ICD-10-CM | POA: Diagnosis not present

## 2023-12-06 DIAGNOSIS — G300 Alzheimer's disease with early onset: Secondary | ICD-10-CM | POA: Diagnosis not present

## 2023-12-06 DIAGNOSIS — M5459 Other low back pain: Secondary | ICD-10-CM | POA: Diagnosis not present

## 2023-12-06 DIAGNOSIS — R29898 Other symptoms and signs involving the musculoskeletal system: Secondary | ICD-10-CM | POA: Diagnosis not present

## 2023-12-06 DIAGNOSIS — I2699 Other pulmonary embolism without acute cor pulmonale: Secondary | ICD-10-CM | POA: Diagnosis not present

## 2023-12-06 DIAGNOSIS — Z89511 Acquired absence of right leg below knee: Secondary | ICD-10-CM | POA: Diagnosis not present

## 2023-12-06 DIAGNOSIS — R41841 Cognitive communication deficit: Secondary | ICD-10-CM | POA: Diagnosis not present

## 2023-12-06 DIAGNOSIS — I10 Essential (primary) hypertension: Secondary | ICD-10-CM | POA: Diagnosis not present

## 2023-12-06 DIAGNOSIS — Z09 Encounter for follow-up examination after completed treatment for conditions other than malignant neoplasm: Secondary | ICD-10-CM | POA: Diagnosis not present

## 2023-12-06 DIAGNOSIS — F325 Major depressive disorder, single episode, in full remission: Secondary | ICD-10-CM | POA: Diagnosis not present

## 2023-12-06 DIAGNOSIS — N1831 Chronic kidney disease, stage 3a: Secondary | ICD-10-CM | POA: Diagnosis not present

## 2023-12-06 DIAGNOSIS — J9601 Acute respiratory failure with hypoxia: Secondary | ICD-10-CM | POA: Diagnosis not present

## 2023-12-06 DIAGNOSIS — M6281 Muscle weakness (generalized): Secondary | ICD-10-CM | POA: Diagnosis not present

## 2023-12-07 DIAGNOSIS — M5459 Other low back pain: Secondary | ICD-10-CM | POA: Diagnosis not present

## 2023-12-07 DIAGNOSIS — R29898 Other symptoms and signs involving the musculoskeletal system: Secondary | ICD-10-CM | POA: Diagnosis not present

## 2023-12-07 DIAGNOSIS — R2681 Unsteadiness on feet: Secondary | ICD-10-CM | POA: Diagnosis not present

## 2023-12-07 DIAGNOSIS — M6281 Muscle weakness (generalized): Secondary | ICD-10-CM | POA: Diagnosis not present

## 2023-12-07 DIAGNOSIS — R41841 Cognitive communication deficit: Secondary | ICD-10-CM | POA: Diagnosis not present

## 2023-12-11 DIAGNOSIS — R2681 Unsteadiness on feet: Secondary | ICD-10-CM | POA: Diagnosis not present

## 2023-12-11 DIAGNOSIS — M6281 Muscle weakness (generalized): Secondary | ICD-10-CM | POA: Diagnosis not present

## 2023-12-11 DIAGNOSIS — R41841 Cognitive communication deficit: Secondary | ICD-10-CM | POA: Diagnosis not present

## 2023-12-11 DIAGNOSIS — M5459 Other low back pain: Secondary | ICD-10-CM | POA: Diagnosis not present

## 2023-12-11 DIAGNOSIS — R29898 Other symptoms and signs involving the musculoskeletal system: Secondary | ICD-10-CM | POA: Diagnosis not present

## 2023-12-12 DIAGNOSIS — R29898 Other symptoms and signs involving the musculoskeletal system: Secondary | ICD-10-CM | POA: Diagnosis not present

## 2023-12-12 DIAGNOSIS — R2681 Unsteadiness on feet: Secondary | ICD-10-CM | POA: Diagnosis not present

## 2023-12-12 DIAGNOSIS — R41841 Cognitive communication deficit: Secondary | ICD-10-CM | POA: Diagnosis not present

## 2023-12-12 DIAGNOSIS — M5459 Other low back pain: Secondary | ICD-10-CM | POA: Diagnosis not present

## 2023-12-12 DIAGNOSIS — M6281 Muscle weakness (generalized): Secondary | ICD-10-CM | POA: Diagnosis not present

## 2023-12-14 DIAGNOSIS — R41841 Cognitive communication deficit: Secondary | ICD-10-CM | POA: Diagnosis not present

## 2023-12-14 DIAGNOSIS — M6281 Muscle weakness (generalized): Secondary | ICD-10-CM | POA: Diagnosis not present

## 2023-12-14 DIAGNOSIS — R29898 Other symptoms and signs involving the musculoskeletal system: Secondary | ICD-10-CM | POA: Diagnosis not present

## 2023-12-14 DIAGNOSIS — R2681 Unsteadiness on feet: Secondary | ICD-10-CM | POA: Diagnosis not present

## 2023-12-14 DIAGNOSIS — M5459 Other low back pain: Secondary | ICD-10-CM | POA: Diagnosis not present

## 2023-12-15 DIAGNOSIS — M6281 Muscle weakness (generalized): Secondary | ICD-10-CM | POA: Diagnosis not present

## 2023-12-15 DIAGNOSIS — R29898 Other symptoms and signs involving the musculoskeletal system: Secondary | ICD-10-CM | POA: Diagnosis not present

## 2023-12-15 DIAGNOSIS — R41841 Cognitive communication deficit: Secondary | ICD-10-CM | POA: Diagnosis not present

## 2023-12-15 DIAGNOSIS — R2681 Unsteadiness on feet: Secondary | ICD-10-CM | POA: Diagnosis not present

## 2023-12-15 DIAGNOSIS — M5459 Other low back pain: Secondary | ICD-10-CM | POA: Diagnosis not present

## 2023-12-15 NOTE — Telephone Encounter (Signed)
 NFN

## 2023-12-19 DIAGNOSIS — M6281 Muscle weakness (generalized): Secondary | ICD-10-CM | POA: Diagnosis not present

## 2023-12-19 DIAGNOSIS — R2681 Unsteadiness on feet: Secondary | ICD-10-CM | POA: Diagnosis not present

## 2023-12-19 DIAGNOSIS — R41841 Cognitive communication deficit: Secondary | ICD-10-CM | POA: Diagnosis not present

## 2023-12-19 DIAGNOSIS — R29898 Other symptoms and signs involving the musculoskeletal system: Secondary | ICD-10-CM | POA: Diagnosis not present

## 2023-12-19 DIAGNOSIS — M5459 Other low back pain: Secondary | ICD-10-CM | POA: Diagnosis not present

## 2023-12-20 DIAGNOSIS — M5459 Other low back pain: Secondary | ICD-10-CM | POA: Diagnosis not present

## 2023-12-20 DIAGNOSIS — R2681 Unsteadiness on feet: Secondary | ICD-10-CM | POA: Diagnosis not present

## 2023-12-20 DIAGNOSIS — R29898 Other symptoms and signs involving the musculoskeletal system: Secondary | ICD-10-CM | POA: Diagnosis not present

## 2023-12-20 DIAGNOSIS — R41841 Cognitive communication deficit: Secondary | ICD-10-CM | POA: Diagnosis not present

## 2023-12-20 DIAGNOSIS — M6281 Muscle weakness (generalized): Secondary | ICD-10-CM | POA: Diagnosis not present

## 2023-12-21 ENCOUNTER — Ambulatory Visit
Admission: RE | Admit: 2023-12-21 | Discharge: 2023-12-21 | Disposition: A | Discharge status: Home / Self Care | Source: Ambulatory Visit | Attending: Nurse Practitioner | Admitting: Nurse Practitioner

## 2023-12-21 DIAGNOSIS — R2681 Unsteadiness on feet: Secondary | ICD-10-CM | POA: Diagnosis not present

## 2023-12-21 DIAGNOSIS — J189 Pneumonia, unspecified organism: Secondary | ICD-10-CM

## 2023-12-21 DIAGNOSIS — M5459 Other low back pain: Secondary | ICD-10-CM | POA: Diagnosis not present

## 2023-12-21 DIAGNOSIS — J849 Interstitial pulmonary disease, unspecified: Secondary | ICD-10-CM

## 2023-12-21 DIAGNOSIS — M6281 Muscle weakness (generalized): Secondary | ICD-10-CM | POA: Diagnosis not present

## 2023-12-21 DIAGNOSIS — R41841 Cognitive communication deficit: Secondary | ICD-10-CM | POA: Diagnosis not present

## 2023-12-21 DIAGNOSIS — I2510 Atherosclerotic heart disease of native coronary artery without angina pectoris: Secondary | ICD-10-CM | POA: Diagnosis not present

## 2023-12-21 DIAGNOSIS — J8410 Pulmonary fibrosis, unspecified: Secondary | ICD-10-CM | POA: Diagnosis not present

## 2023-12-21 DIAGNOSIS — R29898 Other symptoms and signs involving the musculoskeletal system: Secondary | ICD-10-CM | POA: Diagnosis not present

## 2023-12-22 DIAGNOSIS — R41841 Cognitive communication deficit: Secondary | ICD-10-CM | POA: Diagnosis not present

## 2023-12-22 DIAGNOSIS — R2681 Unsteadiness on feet: Secondary | ICD-10-CM | POA: Diagnosis not present

## 2023-12-22 DIAGNOSIS — M6281 Muscle weakness (generalized): Secondary | ICD-10-CM | POA: Diagnosis not present

## 2023-12-22 DIAGNOSIS — R29898 Other symptoms and signs involving the musculoskeletal system: Secondary | ICD-10-CM | POA: Diagnosis not present

## 2023-12-22 DIAGNOSIS — M5459 Other low back pain: Secondary | ICD-10-CM | POA: Diagnosis not present

## 2023-12-24 DIAGNOSIS — M6281 Muscle weakness (generalized): Secondary | ICD-10-CM | POA: Diagnosis not present

## 2023-12-24 DIAGNOSIS — R41841 Cognitive communication deficit: Secondary | ICD-10-CM | POA: Diagnosis not present

## 2023-12-24 DIAGNOSIS — R2681 Unsteadiness on feet: Secondary | ICD-10-CM | POA: Diagnosis not present

## 2023-12-24 DIAGNOSIS — M5459 Other low back pain: Secondary | ICD-10-CM | POA: Diagnosis not present

## 2023-12-24 DIAGNOSIS — R29898 Other symptoms and signs involving the musculoskeletal system: Secondary | ICD-10-CM | POA: Diagnosis not present

## 2023-12-25 ENCOUNTER — Ambulatory Visit: Payer: Self-pay | Admitting: Nurse Practitioner

## 2023-12-25 DIAGNOSIS — M5459 Other low back pain: Secondary | ICD-10-CM | POA: Diagnosis not present

## 2023-12-25 DIAGNOSIS — R2681 Unsteadiness on feet: Secondary | ICD-10-CM | POA: Diagnosis not present

## 2023-12-25 DIAGNOSIS — R41841 Cognitive communication deficit: Secondary | ICD-10-CM | POA: Diagnosis not present

## 2023-12-25 DIAGNOSIS — M6281 Muscle weakness (generalized): Secondary | ICD-10-CM | POA: Diagnosis not present

## 2023-12-25 DIAGNOSIS — R29898 Other symptoms and signs involving the musculoskeletal system: Secondary | ICD-10-CM | POA: Diagnosis not present

## 2023-12-25 NOTE — Progress Notes (Signed)
 Prior hospitalization with concerns for possible underlying ILD. We obtained a repeat HRCT chest and she does appear to have ILD. Unclear the cause of her ILD at this point. There has been progression of disease since 2020. She will need further workup and need to get her in at the ILD clinic. Dr. Geronimo has an ILD slot on 7/24 at 4 pm. Can you see if this works for the patient?

## 2023-12-27 DIAGNOSIS — R2681 Unsteadiness on feet: Secondary | ICD-10-CM | POA: Diagnosis not present

## 2023-12-27 DIAGNOSIS — R41841 Cognitive communication deficit: Secondary | ICD-10-CM | POA: Diagnosis not present

## 2023-12-27 DIAGNOSIS — M5459 Other low back pain: Secondary | ICD-10-CM | POA: Diagnosis not present

## 2023-12-27 DIAGNOSIS — M6281 Muscle weakness (generalized): Secondary | ICD-10-CM | POA: Diagnosis not present

## 2023-12-27 DIAGNOSIS — R29898 Other symptoms and signs involving the musculoskeletal system: Secondary | ICD-10-CM | POA: Diagnosis not present

## 2023-12-28 ENCOUNTER — Encounter: Payer: Self-pay | Admitting: Internal Medicine

## 2023-12-28 ENCOUNTER — Ambulatory Visit (INDEPENDENT_AMBULATORY_CARE_PROVIDER_SITE_OTHER): Admitting: Internal Medicine

## 2023-12-28 VITALS — BP 110/66 | HR 75 | Ht 65.0 in | Wt 155.0 lb

## 2023-12-28 DIAGNOSIS — Z87891 Personal history of nicotine dependence: Secondary | ICD-10-CM

## 2023-12-28 DIAGNOSIS — Z5189 Encounter for other specified aftercare: Secondary | ICD-10-CM | POA: Diagnosis not present

## 2023-12-28 DIAGNOSIS — J8410 Pulmonary fibrosis, unspecified: Secondary | ICD-10-CM | POA: Diagnosis not present

## 2023-12-28 DIAGNOSIS — Z86711 Personal history of pulmonary embolism: Secondary | ICD-10-CM

## 2023-12-28 DIAGNOSIS — Z09 Encounter for follow-up examination after completed treatment for conditions other than malignant neoplasm: Secondary | ICD-10-CM

## 2023-12-28 DIAGNOSIS — Z8709 Personal history of other diseases of the respiratory system: Secondary | ICD-10-CM

## 2023-12-28 DIAGNOSIS — R053 Chronic cough: Secondary | ICD-10-CM

## 2023-12-28 DIAGNOSIS — Z8701 Personal history of pneumonia (recurrent): Secondary | ICD-10-CM

## 2023-12-28 DIAGNOSIS — J849 Interstitial pulmonary disease, unspecified: Secondary | ICD-10-CM | POA: Diagnosis not present

## 2023-12-28 NOTE — Progress Notes (Signed)
 OV 12/28/2023  Subjective:  Patient ID: Shelly [name], female , DOB: Oct 23, 1950 , age 73 y.o. , MRN: [id number] , ADDRESS: [address] [place] [address]  Room [address] [place] [address] PCP Shelly [name], [facility] Patient Care Team: Shelly [name], [facility] as PCP - General (Skilled Nursing and Assisted Living Facility) Darron, Shelly [name] LABOR, MD as PCP - Cardiology (Cardiology) Bonner Ade, MD as Consulting Physician (Physical Medicine and Rehabilitation)  This Provider for this visit: Treatment Team:  Attending Provider: Geronimo Amel, MD    12/28/2023 -   Chief Complaint  Patient presents with   Follow-up    ILD- Review CT 7/17. Pt complains of coughing w/ shortness of breath. Chest pain in rib cage when taking deep breath.      HPI Shelly [name] 73 y.o. -posthospital discharge follow-up.  Significant events are as follows  Significant events: 3/26-4/2>> hospitalization for hypoxia-rhinovirus/strep pneumo antigen positive-discharged on home O2 4/7>> worsening shortness of breath-worsening hypoxemia-admit to [facility] but later transferred to ICU  : 4/7>> CTA chest: Few scattered segmental PE medial left upper lobe/middle lobe-changing parenchymal lung opacities with areas of interstitial septal thickening and groundglass opacities. 4/8>> echo: EF 65-70% 4/8>> B/L lower extremity Doppler: Age indeterminate DVT left peroneal vein 4/10>> transferred to [facility] 4/11>> O2 saturation in the 80s on 15 L of HFNC-CXR unchanged-switched to heated high flow-IV Lasix .  According to the family she was treated as Shelly [name] at the time of this admission.  Started-prednisone  switched to Solu-Medrol . 4/14>> switch to salter high flow 4/15>> down to 5 L salter high flow 4/17>> Down to 2-3 L of oxygen -but still gets short of breath with minimal ambulation requiring around 8-10 L of oxygen . 09/30/2023: Discharged to SNF rehab on oxygen  and also prednisone  taper for 3 weeks.   Subsequently  according to the daughter who is joined by phone her name is Shelly [name] patient spent 1 month in SNF rehab and 3 weeks in assisted living and for the last 1 month has been in independent living.  She was on 5 L at the time of discharge but at this point in time is using 2 L with rest and 3 L with exertion although the sit/stand test here shows that she is quite well even on room air at rest.  She is currently off prednisone .    Prior to being at independent living she has been living in her own house in [place] for the last 15 or 16 years.  [Prior to that lived in [place] for 44 years].  She has never had any mold exposure or down comforter exposure down pillow exposure.  No bird exposure.  She was a previous smoker.  At this point in time she feels much better but she still has cough and dyspnea on exertion.  But the bigger complaint is mental fog.  Review of her old images show that in 2015 she had CT scan of the chest that essentially clear except some subtle bilateral groundglass opacities in the bilateral lower lobes.  In 2020 she had CT abdomen lung images which essentially clear she got ruled out for pulmonary embolism.  The next CT scan is August 30, 2023 CT abdomen shows significant right lower lobe consolidation: This is when she had rhinovirus and pneumococcal pneumonia] chest x-ray confirms the same.  Then readmission September 12, 2023 diffuse groundglass opacities along with pulmonary embolism and then follow-up CT chest November 21, 2023 shows ILD postinflammatory scarring  Last sedimentation  rate was in April 2025 and elevated in the 50s.  SYMPTOM SCALE - ILD 12/28/2023  Current weight   O2 use O2 with ex  Shortness of Breath 0 -> 5 scale with 5 being worst (score 6 If unable to do)  At rest 3  Simple tasks - showers, clothes change, eating, shaving 3  Household (dishes, doing bed, laundry) 4  Shopping 4  Walking level at own pace 2  Walking up Stairs x  Total (30-36) Dyspnea Score 16   How bad is your cough? 4  How bad is your fatigue 3  How bad is appetiee 3  How bad is nausea 0  How bad is vomiting?  0  How bad is diarrhea? 0  How bad is anxiety? 4  How bad is depression 5  Any chronic pain - if so where and how bad 0          SIT STAND TEST - goal 15 times   12/28/2023    O2 used ra   PRobe - finter or forehead forehead   Number sit and stand completed - goal 15 15   Time taken to complete 38 sec   Resting Pulse Ox/HR/Dyspnea  96% and 84/min and dyspnea of 0/10    Peak measures 93% and 115/min and dyspnea of 0/10   Final Pulse Ox/HR 94% and 94/min and dyspnea of 0/10   Desaturated </= 88% no   Desaturated <= 3% points yes   Got Tachycardic >/= 90/min yes   Miscellaneous comments No dyspnea       CT Chest data from date:June 2025*  - personally visualized and independently interpreted : yes - my findings are: agree arrative & Impression  CLINICAL DATA:  Interstitial lung disease, history of vulvar cancer * Tracking Code: BO *   EXAM: CT CHEST WITHOUT CONTRAST   TECHNIQUE: Multidetector CT imaging of the chest was performed following the standard protocol without intravenous contrast. High resolution imaging of the lungs, as well as inspiratory and expiratory imaging, was performed.   RADIATION DOSE REDUCTION: This exam was performed according to the departmental dose-optimization program which includes automated exposure control, adjustment of the mA and/or kV according to patient size and/or use of iterative reconstruction technique.   COMPARISON:  CT chest angiogram, 09/11/2023, 01/11/2019   FINDINGS: Cardiovascular: Aortic atherosclerosis. Normal heart size. Left coronary artery calcifications. No pericardial effusion.   Mediastinum/Nodes: Small densely calcified mediastinal and left hilar lymph nodes. No enlarged mediastinal, hilar, or axillary lymph nodes. Thyroid  gland, trachea, and esophagus demonstrate no significant  findings.   Lungs/Pleura: Densely calcified granulomatous nodule of the left lower lobe. Moderate pulmonary fibrosis in a pattern with apical to basal gradient, featuring irregular peripheral interstitial opacity, septal thickening, traction bronchiectasis, and subpleural bronchiolectasis of the lung bases without clear evidence of honeycombing. Relatively conspicuous associated ground-glass airspace opacity and mosaic attenuation of the airspaces with some evidence of lobular air trapping on expiratory phase imaging. Elevation of the left hemidiaphragm with superimposed scarring or atelectasis. No pleural effusion or pneumothorax.   Upper Abdomen: No acute abnormality. Punctuate granulomatous calcifications throughout the liver and spleen.   Musculoskeletal: No chest wall abnormality. No acute osseous findings.   IMPRESSION: 1. Moderate pulmonary fibrosis in a pattern with apical to basal gradient, featuring irregular peripheral interstitial opacity, septal thickening, traction bronchiectasis, and subpleural bronchiolectasis of the lung bases without clear evidence of honeycombing. Relatively conspicuous associated ground-glass airspace opacity and mosaic attenuation of the airspaces with some evidence  of lobular air trapping on expiratory phase imaging. Although this may reflect a somewhat atypical presentation of UIP/IPF, alternative diagnosis, particularly chronic fibrotic hypersensitivity pneumonitis, should be considered. Fibrotic findings are almost completely de novo in the interval to prior examination dated 01/11/2019, this progression favoring UIP/IPF. Pattern is technically characterized as an alternative diagnosis (not UIP) given incongruous features per consensus guidelines: Diagnosis of Idiopathic Pulmonary Fibrosis: An Official ATS/ERS/JRS/ALAT Clinical Practice Guideline. Am JINNY Honey Crit Care Med Vol 198, Iss 5, [phone], Feb 04 2017. 2. Evidence of prior  granulomatous infection. 3. Coronary artery disease.   Aortic Atherosclerosis (ICD10-I70.0).     Electronically Signed   By: Marolyn JONETTA Jaksch M.D.   On: 12/22/2023 17:33        PFT      No data to display           Latest Reference Range & Units 09/08/10 16:17 05/28/14 09:43 09/14/23 04:40  [place]ain kU/L  <0.10   Lamb's Quarters kU/L  <0.10   Oak kU/L  <0.10   G005 Rye, Perennial kU/L  <0.10   IgE (Immunoglobulin E), Serum <115 kU/L  424 (H)   Allergen, D pternoyssinus,d7 kU/L  0.20 (H)   Cat Dander kU/L  <0.10   Dog Dander kU/L  <0.10   [place] Grass kU/L  <0.10   [place] Grass kU/L  <0.10   Timothy Grass kU/L  <0.10   Aspergillus fumigatus, m3 kU/L  4.09 (H)   Elm IgE kU/L  <0.10   Sycamore Tree kU/L  <0.10   Common Ragweed kU/L  <0.10   Curvularia lunata kU/L  <0.10   D. farinae kU/L  0.13 (H)   Helminthosporium halodes kU/L  <0.10   Stemphylium Botryosum kU/L  <0.10   Candida albicans kU/L  <0.10   Fescue kU/L  <0.10   Goldenrod kU/L  <0.10   Allergen,Goose feathers, e70 kU/L  <0.10   House Dust [place] kU/L  <0.10   [place] IgE kU/L  <0.10   G009 Red Top kU/L  <0.10   Anti Nuclear Antibody (ANA) Negative  Negative  NEG  Negative Negative  CCP Antibodies IgG/IgA 0 - 19 units   6  RA Latex Turbid. <14.0 IU/mL <10  <10.0  SSA (Ro) (ENA) Antibody, IgG 0.0 - 0.9 AI   <0.2  SSB (La) (ENA) Antibody, IgG 0.0 - 0.9 AI   <0.2  Scleroderma (Scl-70) (ENA) Antibody, IgG 0.0 - 0.9 AI   <0.2  (H): Data is abnormally high   LAB RESULTS last 96 hours No results found.       has a past medical history of Anxiety, Arthritis, Asthma, Avascular necrosis of talus (HCC), Cancer (HCC), Depression, Gait abnormality (03/20/2017), GERD (gastroesophageal reflux disease), Hearing loss, History of pneumonia, Hyperlipidemia, Hypertension, Leg edema, Memory difficulty (03/20/2017), PONV (postoperative nausea and vomiting), and Stress incontinence.   reports that she quit  smoking about 16 years ago. Her smoking use included cigarettes. She started smoking about 56 years ago. She has a 40 pack-year smoking history. She has never used smokeless tobacco.  Past Surgical History:  Procedure Laterality Date   ANKLE FUSION  2011   right multiple    ANKLE FUSION     rear ankle fusion   ANKLE SURGERY  2010   right cordicompression   AORTIC ARCH ANGIOGRAPHY N/A 12/11/2017   Procedure: AORTIC ARCH ANGIOGRAPHY;  Surgeon: Harvey Carlin BRAVO, MD;  Location: [facility];  Service: Cardiovascular;  Laterality: N/A;  APPENDECTOMY  05/10/2016   BELOW KNEE LEG AMPUTATION     CATARACT EXTRACTION W/ INTRAOCULAR LENS IMPLANT Left    LAPAROSCOPIC APPENDECTOMY N/A 05/10/2016   Procedure: APPENDECTOMY LAPAROSCOPIC;  Surgeon: Vicenta Poli, MD;  Location: [facility] OR;  Service: General;  Laterality: N/A;   LUMBAR LAMINECTOMY/DECOMPRESSION MICRODISCECTOMY N/A 09/09/2015   Procedure:  L4-S1 Decompression/ Discetomy;  Surgeon: Donaciano Sprang, MD;  Location: [facility] OR;  Service: Orthopedics;  Laterality: N/A;   SKIN GRAFT     SPINAL CORD STIMULATOR BATTERY EXCHANGE  06/23/2023   SPINAL CORD STIMULATOR INSERTION N/A 01/10/2019   Procedure: LUMBAR SPINAL CORD STIMULATOR INSERTION;  Surgeon: Sprang Donaciano, MD;  Location: [facility] OR;  Service: Orthopedics;  Laterality: N/A;  2.5 hrs   TONSILLECTOMY     TUBAL LIGATION  1983   VULVECTOMY N/A 01/28/2015   Procedure: WIDE EXCISION VULVECTOMY;  Surgeon: Hargis Paradise, MD;  Location: [facility];  Service: Gynecology;  Laterality: N/A;    Allergies  Allergen Reactions   Delsym [Dextromethorphan] Other (See Comments)    Cognitive impairment    Immunization History  Administered Date(s) Administered   Influenza Split 02/06/2011, 02/24/2011, 03/05/2013, 04/06/2014   Influenza, High Dose Seasonal PF 03/08/2021   Influenza,inj,Quad PF,6+ Mos 04/24/2015   Influenza,inj,quad, With Preservative 04/27/2017   Influenza-Unspecified 02/22/2011,  02/19/2012, 05/13/2016, 05/02/2017, 05/22/2018, 03/11/2019, 03/03/2020, 03/04/2021, 04/11/2022   PFIZER Comirnaty(Gray Top)Covid-19 Tri-Sucrose Vaccine 02/05/2023   PFIZER(Purple Top)SARS-COV-2 Vaccination 06/23/2019, 07/13/2019, 03/08/2020   PNEUMOCOCCAL CONJUGATE-20 09/24/2021   Pneumococcal Conjugate-13 11/15/2013   Pneumococcal Polysaccharide-23 12/25/2014   Td 06/25/2023   Tdap 04/11/2011, 09/24/2021, 06/25/2023   Unspecified SARS-COV-2 Vaccination 05/21/2021   Zoster Recombinant(Shingrix) 09/28/2017, 02/01/2018   Zoster, Live 05/07/2012, 09/28/2017, 02/01/2018    Family History  Problem Relation Age of Onset   Dementia Mother 10       alive   Fibromyalgia Mother    Allergies Mother    Heart attack Father 67       deceased   Multiple myeloma Father    Heart murmur Sister 42       she had open heart surgery   Other Sister 107       She is bIpolar- diabetic   Breast cancer Sister    Other Brother        alive   Heart disease Brother        emergent CABG for 99% blocked CAD     Current Outpatient Medications:    acetaminophen  (TYLENOL ) 325 MG suppository, Place 325 mg rectally every 4 (four) hours as needed for moderate pain (pain score 4-6)., Disp: , Rfl:    acetaminophen  (TYLENOL ) 325 MG suppository, Place 325 mg rectally every 4 (four) hours as needed for fever., Disp: , Rfl:    acyclovir cream (ZOVIRAX) 5 %, Apply 1 Application topically 2 (two) times daily as needed (cold sore)., Disp: , Rfl:    albuterol  (PROAIR  HFA) 108 (90 Base) MCG/ACT inhaler, 2 puffs every 4 hours as needed only  if your can't catch your breath, Disp: 18 g, Rfl: 0   apixaban  (ELIQUIS ) 5 MG TABS tablet, Take 1 tablet (5 mg total) by mouth 2 (two) times daily., Disp: 60 tablet, Rfl: 0   benzonatate  (TESSALON ) 200 MG capsule, Take 1 capsule (200 mg total) by mouth 3 (three) times daily as needed for cough., Disp: 30 capsule, Rfl: 0   budesonide -formoterol  (SYMBICORT ) 160-4.5 MCG/ACT inhaler, Inhale 2  puffs into the lungs in the morning and at bedtime., Disp: 1 each, Rfl: 12  buPROPion  (WELLBUTRIN  XL) 300 MG 24 hr tablet, Take 300 mg by mouth daily., Disp: , Rfl:    calcium  carbonate (OSCAL) 1500 (600 Ca) MG TABS tablet, Take 1 tablet (1,500 mg total) by mouth at bedtime., Disp: 30 tablet, Rfl: 0   denosumab  (PROLIA ) 60 MG/ML SOLN injection, Inject 60 mg into the skin every 6 (six) months. , Disp: , Rfl:    donepezil  (ARICEPT ) 10 MG tablet, Take 1 tablet (10 mg total) by mouth at bedtime., Disp: 30 tablet, Rfl: 0   FLUoxetine  (PROZAC ) 20 MG capsule, Take 1 capsule (20 mg total) by mouth daily., Disp: 30 capsule, Rfl: 0   ipratropium-albuterol  (DUONEB) 0.5-2.5 (3) MG/3ML SOLN, Take 3 mLs by nebulization every 6 (six) hours as needed (Shortness of breath, cough and/or wheeze). Use this or albuterol  inhaler but not both., Disp: 360 mL, Rfl: 1   MAGNESIUM  GLYCINATE PO, Take 1 tablet by mouth at bedtime. 200 mg, Disp: , Rfl:    melatonin 3 MG TABS tablet, Take 3 mg by mouth at bedtime., Disp: , Rfl:    midodrine  (PROAMATINE ) 5 MG tablet, Take 1 tablet (5 mg total) by mouth daily., Disp: 30 tablet, Rfl: 0   montelukast  (SINGULAIR ) 10 MG tablet, Take 1 tablet (10 mg total) by mouth at bedtime., Disp: 30 tablet, Rfl: 0   Multiple Vitamins-Minerals (CENTRUM SILVER WOMEN 50+) TABS, Take 1 tablet by mouth daily., Disp: , Rfl:    nystatin cream (MYCOSTATIN), Apply 1 Application topically., Disp: , Rfl:    OXYGEN , Inhale into the lungs. O2 2L, Monitor O2 sat Qshift to maintain O2 Sat >95% every shift, Disp: , Rfl:    pantoprazole  (PROTONIX ) 40 MG tablet, TAKE 30 TO 60 MINUTES BEFORE THE FIRST AND LAST MEAL OF THE DAY, Disp: 60 tablet, Rfl: 0   polyethylene glycol (MIRALAX  / GLYCOLAX ) 17 g packet, Take 17 g by mouth daily. 1 scoop every 24 hours as needed for constipation, Disp: , Rfl:    Potassium 99 MG TABS, Take 1 tablet (99 mg total) by mouth at bedtime., Disp: 30 tablet, Rfl: 0   rosuvastatin  (CRESTOR )  10 MG tablet, Take 1 tablet (10 mg total) by mouth every evening., Disp: 30 tablet, Rfl: 0   traMADol  (ULTRAM ) 50 MG tablet, Take 1 tablet (50 mg total) by mouth daily as needed., Disp: 5 tablet, Rfl: 0   triamcinolone  cream (KENALOG ) 0.1 %, Apply 1 Application topically., Disp: , Rfl:    TURMERIC CURCUMIN PO, Take 1,500 mg by mouth daily., Disp: , Rfl:    valACYclovir (VALTREX) 1000 MG tablet, Take 1,000 mg by mouth as needed. Continue until cold sores resolve., Disp: , Rfl:       Objective:   Vitals:   12/28/23 1559  BP: 110/66  Pulse: 75  SpO2: 94%  Weight: 155 lb (70.3 kg)  Height: 5' 5 (1.651 m)    Estimated body mass index is 25.79 kg/m as calculated from the following:   Height as of this encounter: 5' 5 (1.651 m).   Weight as of this encounter: 155 lb (70.3 kg).  @WEIGHTCHANGE @  [employer]   12/28/23 1559  Weight: 155 lb (70.3 kg)     Physical Exam   General: No distress. Looks well O2 at rest: no Cane present: no Sitting in wheel chair: no Frail: no Obese: no Neuro: Alert and Oriented x 3. GCS 15. Speech normal Psych: Pleasant Resp:  Barrel Chest - no.  Wheeze - no, Crackles - YES BIALTERAL LOWER,  No overt respiratory distress CVS: Normal heart sounds. Murmurs - no Ext: Stigmata of Connective Tissue Disease - no BUT RLE POSTEHESIS - old HEENT: Normal upper airway. PEERL +. No post nasal drip        Assessment/     Assessment & Plan Hospital discharge follow-up  Postinflammatory pulmonary fibrosis (HCC)  ILD (interstitial lung disease) (HCC)  History of acute respiratory failure  History of pneumococcal pneumonia  History of ARDS  Chronic cough  History of pulmonary embolism    PLAN Patient Instructions     ICD-10-CM   1. ILD (interstitial lung disease) (HCC)  J84.9     2. History of acute respiratory failure  Z87.09     3. History of pneumococcal pneumonia  Z87.01     4. History of ARDS  Z87.09     5. Chronic cough   R05.3       #ILD  You have  postinflammatory pulmonary fibrosis following Shelly [name] which followed pneumococcal pneumonia and rhinovirus ARDS You have had significant improvement following treatment in the hospital and steroids  - Such that with a simple exercise hypoxemia test your oxygen  levels are staying up However you have not fully resolved the condition Whether you will fully improve on the condition or not improve is not known  Plan  - Check ESR; based on the results might consider another 3-8 weeks course of prednisone  to see if we can make any further improvement  - If you do not hear from us  within a week please to call us  about these results - Continue oxygen  with exertion and at night - Refer to pulmonary rehab to continue pulmonary reconditioning strength - Spirometry and DLCO in 3 months  #PE 09/11/23 ( Segmental left-sided pulmonary embolism with left peroneal vein DVT   Plan Continue Eliquis ,)  Follow-up - Return to see Dr. Geronimo in 3 months in a 15-minute slot but ater spiro and dlco      FOLLOWUP    Return in about 3 months (around 03/29/2024) for 15 min visit, after Spiro and DLCO, with Dr Geronimo, Face to Face Visit, after HRCT chest.  ( Level 05 visit E&M 2024: Estb >= 40 min n  visit type: on-site physical face to visit  in total care time and counseling or/and coordination of care by this undersigned MD - Dr Dorethia Geronimo. This includes one or more of the following on this same day 12/28/2023: pre-charting, chart review, note writing, documentation discussion of test results, diagnostic or treatment recommendations, prognosis, risks and benefits of management options, instructions, education, compliance or risk-factor reduction. It excludes time spent by the CMA or office staff in the care of the patient. Actual time 45 min)   SIGNATURE    Dr. Dorethia Geronimo, M.D., F.C.C.P,  Pulmonary and Critical Care Medicine Staff Physician, [facility] Center Director - Interstitial Lung Disease  Program  Pulmonary Fibrosis [facility] at [facility] [place], [place], [address]  Pager: [phone], If no answer or between  15:00h - 7:00h: call [phone] Telephone: [phone]  5:22 PM 12/28/2023

## 2023-12-28 NOTE — Patient Instructions (Addendum)
 ICD-10-CM   1. ILD (interstitial lung disease) (HCC)  J84.9     2. History of acute respiratory failure  Z87.09     3. History of pneumococcal pneumonia  Z87.01     4. History of ARDS  Z87.09     5. Chronic cough  R05.3       #ILD  You have  postinflammatory pulmonary fibrosis following [place] which followed pneumococcal pneumonia and rhinovirus ARDS You have had significant improvement following treatment in the hospital and steroids  - Such that with a simple exercise hypoxemia test your oxygen  levels are staying up However you have not fully resolved the condition Whether you will fully improve on the condition or not improve is not known  Plan  - Check ESR; based on the results might consider another 3-8 weeks course of prednisone  to see if we can make any further improvement  - If you do not hear from us  within a week please to call us  about these results - Continue oxygen  with exertion and at night - Refer to pulmonary rehab to continue pulmonary reconditioning strength - Spirometry and DLCO in 3 months  #PE 09/11/23 ( Segmental left-sided pulmonary embolism with left peroneal vein DVT   Plan Continue Eliquis ,)  Follow-up - Return to see Dr. Geronimo in 3 months in a 15-minute slot but ater spiro and dlco

## 2023-12-29 ENCOUNTER — Encounter: Payer: Self-pay | Admitting: Sports Medicine

## 2023-12-29 ENCOUNTER — Ambulatory Visit (SKILLED_NURSING_FACILITY): Admitting: Sports Medicine

## 2023-12-29 VITALS — BP 106/60 | HR 81 | Temp 97.3°F | Wt 154.0 lb

## 2023-12-29 DIAGNOSIS — G3184 Mild cognitive impairment, so stated: Secondary | ICD-10-CM

## 2023-12-29 DIAGNOSIS — R29898 Other symptoms and signs involving the musculoskeletal system: Secondary | ICD-10-CM | POA: Diagnosis not present

## 2023-12-29 DIAGNOSIS — M6281 Muscle weakness (generalized): Secondary | ICD-10-CM | POA: Diagnosis not present

## 2023-12-29 DIAGNOSIS — I2699 Other pulmonary embolism without acute cor pulmonale: Secondary | ICD-10-CM

## 2023-12-29 DIAGNOSIS — M5459 Other low back pain: Secondary | ICD-10-CM | POA: Diagnosis not present

## 2023-12-29 DIAGNOSIS — J849 Interstitial pulmonary disease, unspecified: Secondary | ICD-10-CM | POA: Diagnosis not present

## 2023-12-29 DIAGNOSIS — R2681 Unsteadiness on feet: Secondary | ICD-10-CM | POA: Diagnosis not present

## 2023-12-29 DIAGNOSIS — R053 Chronic cough: Secondary | ICD-10-CM

## 2023-12-29 DIAGNOSIS — R41841 Cognitive communication deficit: Secondary | ICD-10-CM | POA: Diagnosis not present

## 2023-12-29 MED ORDER — FLUTICASONE PROPIONATE 50 MCG/ACT NA SUSP: 2.0000 | Freq: Every day | NASAL | 6 refills | Status: AC

## 2023-12-29 NOTE — Progress Notes (Signed)
 Careteam: Patient Care Team: [facility] as PCP - General (Skilled Nursing and Assisted Living Facility) Darron, Deatrice LABOR, MD as PCP - Cardiology (Cardiology) Bonner Ade, MD as Consulting Physician (Physical Medicine and Rehabilitation)  PLACE OF SERVICE:  [facility]  Advanced Directive information    Allergies  Allergen Reactions   Delsym [Dextromethorphan] Other (See Comments)    Cognitive impairment    Chief Complaint  Patient presents with   Medical Management of Chronic Issues    PCP Follow up                         [facility]   Discussed the use of AI scribe software for clinical note transcription with the patient, who gave verbal consent to proceed.  History of Present Illness  Shelly [name] is a 73 year old female who presents for  skilled care discharge follow up   She experiences shortness of breath, particularly when stressed or walking quickly. She uses supplemental oxygen  at a rate of 2 liters per minute while in her apartment and increases it to 3 liters per minute when walking around. She is on oxygen  24 hours a day. Previously, while in skilled care, she was on 4 liters per minute, which was gradually reduced to 2 liters per minute. She also reports occasional dizziness, particularly when coughing.  She experiences a persistent cough, which worsens with exertion and sometimes produces phlegm. Her nose runs constantly, and she occasionally uses nasal spray. No fever, but she reports occasional nausea that resolves quickly. She does not experience heartburn or acid reflux.  She is currently taking Eliquis  due to a small clot found in her lungs during a previous hospital stay. No blood in her stool or urine.  She experiences generalized body pain, particularly in her shoulder and back, which she attributes to physical therapy. She attends physical therapy three times a week and is awaiting a new prosthesis as she has outgrown her  current one.  She has been living in an independent living facility since her discharge from skilled care, having moved from her own home. She expresses some confusion and frustration with the transition and the lack of orientation at the facility.    Review of Systems:  Review of Systems  Constitutional:  Negative for chills and fever.  HENT:  Negative for congestion, sinus pain and sore throat.   Eyes:  Negative for double vision.  Respiratory:  Positive for cough, sputum production and shortness of breath.   Cardiovascular:  Negative for chest pain, palpitations and leg swelling.  Gastrointestinal:  Negative for abdominal pain, heartburn and nausea.  Genitourinary:  Negative for dysuria, frequency and hematuria.  Musculoskeletal:  Positive for joint pain. Negative for falls.   Negative unless indicated in HPI.   Past Medical History:  Diagnosis Date   Anxiety    Arthritis    Asthma    Avascular necrosis of talus (HCC)    Cancer (HCC)    vulva pre cancer had surgery   Depression    Gait abnormality 03/20/2017   GERD (gastroesophageal reflux disease)    Hearing loss    very minor   History of pneumonia    Hyperlipidemia    Hypertension    Leg edema    left   Memory difficulty 03/20/2017   PONV (postoperative nausea and vomiting)    Stress incontinence    Past Surgical History:  Procedure Laterality Date  ANKLE FUSION  2011   right multiple    ANKLE FUSION     rear ankle fusion   ANKLE SURGERY  2010   right cordicompression   AORTIC ARCH ANGIOGRAPHY N/A 12/11/2017   Procedure: AORTIC ARCH ANGIOGRAPHY;  Surgeon: Harvey Carlin BRAVO, MD;  Location: [facility] INVASIVE CV [facility];  Service: Cardiovascular;  Laterality: N/A;   APPENDECTOMY  05/10/2016   BELOW KNEE LEG AMPUTATION     CATARACT EXTRACTION W/ INTRAOCULAR LENS IMPLANT Left    LAPAROSCOPIC APPENDECTOMY N/A 05/10/2016   Procedure: APPENDECTOMY LAPAROSCOPIC;  Surgeon: Vicenta Poli, MD;  Location: [facility] OR;  Service:  General;  Laterality: N/A;   LUMBAR LAMINECTOMY/DECOMPRESSION MICRODISCECTOMY N/A 09/09/2015   Procedure:  L4-S1 Decompression/ Discetomy;  Surgeon: Donaciano Sprang, MD;  Location: [facility] OR;  Service: Orthopedics;  Laterality: N/A;   SKIN GRAFT     SPINAL CORD STIMULATOR BATTERY EXCHANGE  06/23/2023   SPINAL CORD STIMULATOR INSERTION N/A 01/10/2019   Procedure: LUMBAR SPINAL CORD STIMULATOR INSERTION;  Surgeon: Sprang Donaciano, MD;  Location: [facility] OR;  Service: Orthopedics;  Laterality: N/A;  2.5 hrs   TONSILLECTOMY     TUBAL LIGATION  1983   VULVECTOMY N/A 01/28/2015   Procedure: WIDE EXCISION VULVECTOMY;  Surgeon: Hargis Paradise, MD;  Location: [facility] ORS;  Service: Gynecology;  Laterality: N/A;   Social History:   reports that she quit smoking about 16 years ago. Her smoking use included cigarettes. She started smoking about 56 years ago. She has a 40 pack-year smoking history. She has never used smokeless tobacco. She reports current alcohol  use. She reports that she does not use drugs.  Family History  Problem Relation Age of Onset   Dementia Mother 63       alive   Fibromyalgia Mother    Allergies Mother    Heart attack Father 58       deceased   Multiple myeloma Father    Heart murmur Sister 91       she had open heart surgery   Other Sister 5       She is bIpolar- diabetic   Breast cancer Sister    Other Brother        alive   Heart disease Brother        emergent CABG for 99% blocked CAD    Medications: Patient's Medications  New Prescriptions   No medications on file  Previous Medications   ACETAMINOPHEN  (TYLENOL ) 325 MG SUPPOSITORY    Place 325 mg rectally every 4 (four) hours as needed for moderate pain (pain score 4-6).   ACETAMINOPHEN  (TYLENOL ) 325 MG SUPPOSITORY    Place 325 mg rectally every 4 (four) hours as needed for fever.   ACYCLOVIR CREAM (ZOVIRAX) 5 %    Apply 1 Application topically 2 (two) times daily as needed (cold sore).   ALBUTEROL  (PROAIR  HFA) 108 (90  BASE) MCG/ACT INHALER    2 puffs every 4 hours as needed only  if your can't catch your breath   APIXABAN  (ELIQUIS ) 5 MG TABS TABLET    Take 1 tablet (5 mg total) by mouth 2 (two) times daily.   BENZONATATE  (TESSALON ) 200 MG CAPSULE    Take 1 capsule (200 mg total) by mouth 3 (three) times daily as needed for cough.   BUDESONIDE -FORMOTEROL  (SYMBICORT ) 160-4.5 MCG/ACT INHALER    Inhale 2 puffs into the lungs in the morning and at bedtime.   BUPROPION  (WELLBUTRIN  XL) 300 MG 24 HR TABLET    Take 300  mg by mouth daily.   CALCIUM  CARBONATE (OSCAL) 1500 (600 CA) MG TABS TABLET    Take 1 tablet (1,500 mg total) by mouth at bedtime.   DENOSUMAB  (PROLIA ) 60 MG/ML SOLN INJECTION    Inject 60 mg into the skin every 6 (six) months.    DONEPEZIL  (ARICEPT ) 10 MG TABLET    Take 1 tablet (10 mg total) by mouth at bedtime.   FLUOXETINE  (PROZAC ) 20 MG CAPSULE    Take 1 capsule (20 mg total) by mouth daily.   IPRATROPIUM-ALBUTEROL  (DUONEB) 0.5-2.5 (3) MG/3ML SOLN    Take 3 mLs by nebulization every 6 (six) hours as needed (Shortness of breath, cough and/or wheeze). Use this or albuterol  inhaler but not both.   MAGNESIUM  GLYCINATE PO    Take 1 tablet by mouth at bedtime. 200 mg   MELATONIN 3 MG TABS TABLET    Take 3 mg by mouth at bedtime.   MIDODRINE  (PROAMATINE ) 5 MG TABLET    Take 1 tablet (5 mg total) by mouth daily.   MONTELUKAST  (SINGULAIR ) 10 MG TABLET    Take 1 tablet (10 mg total) by mouth at bedtime.   MULTIPLE VITAMINS-MINERALS (CENTRUM SILVER WOMEN 50+) TABS    Take 1 tablet by mouth daily.   NYSTATIN CREAM (MYCOSTATIN)    Apply 1 Application topically.   ONDANSETRON  (ZOFRAN -ODT) 8 MG DISINTEGRATING TABLET    Take 8 mg by mouth every 8 (eight) hours as needed for nausea or vomiting.   OXYGEN     Inhale into the lungs. O2 2L, Monitor O2 sat Qshift to maintain O2 Sat >95% every shift   PANTOPRAZOLE  (PROTONIX ) 40 MG TABLET    TAKE 30 TO 60 MINUTES BEFORE THE FIRST AND LAST MEAL OF THE DAY   POLYETHYLENE  GLYCOL (MIRALAX  / GLYCOLAX ) 17 G PACKET    Take 17 g by mouth daily. 1 scoop every 24 hours as needed for constipation   POTASSIUM 99 MG TABS    Take 1 tablet (99 mg total) by mouth at bedtime.   QC POTASSIUM 595 (99 K) MG TABS TABLET    Take 595 mg by mouth daily.   ROSUVASTATIN  (CRESTOR ) 10 MG TABLET    Take 1 tablet (10 mg total) by mouth every evening.   TRAMADOL  (ULTRAM ) 50 MG TABLET    Take 1 tablet (50 mg total) by mouth daily as needed.   TRIAMCINOLONE  CREAM (KENALOG ) 0.1 %    Apply 1 Application topically.   TURMERIC CURCUMIN 500 MG CAPS    1000 mg 2 tablets Orally once a day   TURMERIC CURCUMIN PO    Take 1,500 mg by mouth daily.   VALACYCLOVIR (VALTREX) 1000 MG TABLET    Take 1,000 mg by mouth as needed. Continue until cold sores resolve.  Modified Medications   No medications on file  Discontinued Medications   No medications on file    Physical Exam: Vitals:   12/29/23 0923  BP: 106/60  Pulse: 81  Temp: (!) 97.3 F (36.3 C)  SpO2: 95%  Weight: 154 lb (69.9 kg)   Body mass index is 25.63 kg/m. BP Readings from Last 3 Encounters:  12/29/23 106/60  12/28/23 110/66  11/27/23 (!) 117/55   Wt Readings from Last 3 Encounters:  12/29/23 154 lb (69.9 kg)  12/28/23 155 lb (70.3 kg)  11/27/23 146 lb 6.4 oz (66.4 kg)    Physical Exam Constitutional:      Appearance: Normal appearance.  HENT:     Head:  Normocephalic and atraumatic.  Cardiovascular:     Rate and Rhythm: Normal rate and regular rhythm.  Pulmonary:     Effort: Pulmonary effort is normal. No respiratory distress.     Breath sounds: Rales present. No wheezing.     Comments: Rattling Left lung base Abdominal:     General: Bowel sounds are normal. There is no distension.     Tenderness: There is no abdominal tenderness. There is no guarding or rebound.     Comments:    Musculoskeletal:        General: No swelling or tenderness.  Neurological:     Mental Status: She is alert. Mental status is at  baseline.     Motor: No weakness.     Labs reviewed: Basic Metabolic Panel: Recent Labs    08/30/23 1745 08/31/23 0801 09/03/23 0242 09/04/23 0357 09/05/23 0330 09/06/23 0404 09/11/23 0541 09/23/23 9561 09/25/23 0436 09/28/23 0450 10/10/23 0000 10/26/23 0000  NA  --    < >  --  138 140 139   < > 136 137 135 140 140  K  --    < >  --  3.2* 3.2* 3.7   < > 4.0 4.2 3.9 4.3 4.3  CL  --    < >  --  101 104 105   < > 96* 99 96* 103 103  CO2  --    < >  --  26 27 22    < > 29 32 28 31* 31*  GLUCOSE  --    < >  --  150* 117* 95   < > 84 81 92  --   --   BUN  --    < >  --  12 15 16    < > 16 14 20  33* 15  CREATININE 1.18*   < >  --  0.68 0.66 0.73   < > 0.57 0.62 0.69 0.5 0.6  CALCIUM   --    < >  --  7.6* 7.8* 7.9*   < > 8.5* 8.9 8.9 8.3* 8.7  MG  --    < > 1.8 2.0 2.2 1.8  --   --  2.0 1.9  --   --   PHOS  --   --  2.7 2.9 2.3*  --   --   --   --   --   --   --   TSH 1.153  --   --   --   --   --   --   --   --   --   --  0.74   < > = values in this interval not displayed.   Liver Function Tests: Recent Labs    09/03/23 0200 09/05/23 0330 09/06/23 0404 09/11/23 0541 10/26/23 0000  AST 53*  --  42* 29 19  ALT 46*  --  63* 35 15  ALKPHOS 64  --  57 71  --   BILITOT 1.2  --  0.7 0.9  --   PROT 5.9*  --  5.4* 6.0*  --   ALBUMIN  2.5*   < > 2.2* 2.4* 3.2*   < > = values in this interval not displayed.   Recent Labs    04/01/23 1847  LIPASE 14   No results for input(s): AMMONIA in the last 8760 hours. CBC: Recent Labs    09/23/23 0438 09/25/23 0436 09/28/23 0450 10/10/23 0000 10/17/23 0000 10/26/23 0000  WBC 12.9* 13.2* 16.1*  8.9 6.6 6.0  NEUTROABS  --  9.3* 12.6*  --  3,478.00 3,324.00  HGB 9.6* 10.1* 11.0* 10.0* 10.0* 10.1*  HCT 29.7* 31.4* 33.4* 31* 32* 33*  MCV 96.4 96.3 95.4  --   --   --   PLT 310 314 322 234 276 327   Lipid Panel: Recent Labs    10/26/23 0000  CHOL 139  HDL 56  LDLCALC 69  TRIG 62   TSH: Recent Labs    08/30/23 1745  10/26/23 0000  TSH 1.153 0.74   A1C: Lab Results  Component Value Date   HGBA1C 5.8 10/26/2023    Assessment and Plan Assessment & Plan   1. Interstitial lung disease (HCC) (Primary) Follow up with pulmonology Cont with supplemental o2   2. Chronic cough Instructed to use flonase, singulair   - fluticasone (FLONASE) 50 MCG/ACT nasal spray; Place 2 sprays into both nostrils daily.  Dispense: 16 g; Refill: 6  3. Other pulmonary embolism without acute cor pulmonale, unspecified chronicity (HCC) No signs of bleeding  Cont with eliquis    4. MCI (mild cognitive impairment) Cont with aricept   Increase cognitively engaging activities  Other orders - ondansetron  (ZOFRAN -ODT) 8 MG disintegrating tablet; Take 8 mg by mouth every 8 (eight) hours as needed for nausea or vomiting. - QC POTASSIUM 595 (99 K) MG TABS tablet; Take 595 mg by mouth daily. - Turmeric Curcumin 500 MG CAPS; 1000 mg 2 tablets Orally once a day

## 2024-01-01 ENCOUNTER — Other Ambulatory Visit: Payer: Self-pay | Admitting: Sports Medicine

## 2024-01-01 DIAGNOSIS — R41841 Cognitive communication deficit: Secondary | ICD-10-CM | POA: Diagnosis not present

## 2024-01-01 DIAGNOSIS — R29898 Other symptoms and signs involving the musculoskeletal system: Secondary | ICD-10-CM | POA: Diagnosis not present

## 2024-01-01 DIAGNOSIS — M6281 Muscle weakness (generalized): Secondary | ICD-10-CM | POA: Diagnosis not present

## 2024-01-01 DIAGNOSIS — M5459 Other low back pain: Secondary | ICD-10-CM | POA: Diagnosis not present

## 2024-01-01 DIAGNOSIS — R2681 Unsteadiness on feet: Secondary | ICD-10-CM | POA: Diagnosis not present

## 2024-01-01 NOTE — Telephone Encounter (Signed)
 Pended medicaton for pt.  QC potassium has never been refilled by pcp ETTER Blazing, Orpha, MD ) Patient has request for refill on 01/01/2024. Patient last refill was 11/27/2023. Patient doesn't have a contract on file.  Patient has upcoming appointment in Oct 2025 with [facility]. Updated contract/Sign contract was added to appointment notes. Medication pend and sent to PCP Louvella Orpha, MD )

## 2024-01-01 NOTE — Telephone Encounter (Signed)
 Pharmacy requested refill.  Patient was seen on 12/29/2023 at facility  Pended Rx's and sent to Dr. Sherlynn for approval.

## 2024-01-02 ENCOUNTER — Telehealth: Payer: Self-pay

## 2024-01-02 ENCOUNTER — Other Ambulatory Visit: Payer: Self-pay | Admitting: Sports Medicine

## 2024-01-02 DIAGNOSIS — R29898 Other symptoms and signs involving the musculoskeletal system: Secondary | ICD-10-CM | POA: Diagnosis not present

## 2024-01-02 DIAGNOSIS — M5459 Other low back pain: Secondary | ICD-10-CM | POA: Diagnosis not present

## 2024-01-02 DIAGNOSIS — M6281 Muscle weakness (generalized): Secondary | ICD-10-CM | POA: Diagnosis not present

## 2024-01-02 DIAGNOSIS — R41841 Cognitive communication deficit: Secondary | ICD-10-CM | POA: Diagnosis not present

## 2024-01-02 DIAGNOSIS — R2681 Unsteadiness on feet: Secondary | ICD-10-CM | POA: Diagnosis not present

## 2024-01-02 MED ORDER — TRAMADOL HCL 50 MG PO TABS
50.0000 mg | ORAL_TABLET | Freq: Two times a day (BID) | ORAL | 0 refills | Status: DC | PRN
Start: 2024-01-02 — End: 2024-01-02

## 2024-01-02 NOTE — Telephone Encounter (Signed)
 Contacted pt on behalf of pts pcp [name], MD  to see how many times a day pt is taking tramadol . Relayed information to Dr. Sherlynn in person.

## 2024-01-02 NOTE — Telephone Encounter (Signed)
 Left message on voicemail for patient to return call when available. Reason for call was to ask [name], MD questions  1.) Why is patient taking tramadol ? 2.) How often is patient taking tramadol  ?

## 2024-01-02 NOTE — Telephone Encounter (Signed)
 Patient states that she had two missed calls from the office. Per patient she has already spoken with the pharmacy regarding her Tramadol  and have refilled it for her. Per patient she is taking it Monday morning and Monday evening.

## 2024-01-02 NOTE — Telephone Encounter (Signed)
 See multiple encoutner about the same concern. Good to know how patient is taking, however [name], MD asked WHY patient is taking also.

## 2024-01-03 DIAGNOSIS — R41841 Cognitive communication deficit: Secondary | ICD-10-CM | POA: Diagnosis not present

## 2024-01-03 DIAGNOSIS — M5459 Other low back pain: Secondary | ICD-10-CM | POA: Diagnosis not present

## 2024-01-03 DIAGNOSIS — R2681 Unsteadiness on feet: Secondary | ICD-10-CM | POA: Diagnosis not present

## 2024-01-03 DIAGNOSIS — M6281 Muscle weakness (generalized): Secondary | ICD-10-CM | POA: Diagnosis not present

## 2024-01-03 DIAGNOSIS — R29898 Other symptoms and signs involving the musculoskeletal system: Secondary | ICD-10-CM | POA: Diagnosis not present

## 2024-01-04 ENCOUNTER — Encounter: Admitting: Nurse Practitioner

## 2024-01-04 DIAGNOSIS — M5459 Other low back pain: Secondary | ICD-10-CM | POA: Diagnosis not present

## 2024-01-04 DIAGNOSIS — M6281 Muscle weakness (generalized): Secondary | ICD-10-CM | POA: Diagnosis not present

## 2024-01-04 DIAGNOSIS — R2681 Unsteadiness on feet: Secondary | ICD-10-CM | POA: Diagnosis not present

## 2024-01-04 DIAGNOSIS — R41841 Cognitive communication deficit: Secondary | ICD-10-CM | POA: Diagnosis not present

## 2024-01-04 DIAGNOSIS — R29898 Other symptoms and signs involving the musculoskeletal system: Secondary | ICD-10-CM | POA: Diagnosis not present

## 2024-01-05 DIAGNOSIS — R2681 Unsteadiness on feet: Secondary | ICD-10-CM | POA: Diagnosis not present

## 2024-01-05 DIAGNOSIS — M5459 Other low back pain: Secondary | ICD-10-CM | POA: Diagnosis not present

## 2024-01-05 DIAGNOSIS — R41841 Cognitive communication deficit: Secondary | ICD-10-CM | POA: Diagnosis not present

## 2024-01-05 DIAGNOSIS — M6281 Muscle weakness (generalized): Secondary | ICD-10-CM | POA: Diagnosis not present

## 2024-01-05 DIAGNOSIS — R29898 Other symptoms and signs involving the musculoskeletal system: Secondary | ICD-10-CM | POA: Diagnosis not present

## 2024-01-08 ENCOUNTER — Telehealth: Payer: Self-pay

## 2024-01-08 DIAGNOSIS — M6281 Muscle weakness (generalized): Secondary | ICD-10-CM | POA: Diagnosis not present

## 2024-01-08 DIAGNOSIS — R41841 Cognitive communication deficit: Secondary | ICD-10-CM | POA: Diagnosis not present

## 2024-01-08 DIAGNOSIS — M5459 Other low back pain: Secondary | ICD-10-CM | POA: Diagnosis not present

## 2024-01-08 DIAGNOSIS — R2681 Unsteadiness on feet: Secondary | ICD-10-CM | POA: Diagnosis not present

## 2024-01-08 DIAGNOSIS — R29898 Other symptoms and signs involving the musculoskeletal system: Secondary | ICD-10-CM | POA: Diagnosis not present

## 2024-01-08 NOTE — Telephone Encounter (Unsigned)
 Copied from CRM [id number]. Topic: Clinical - Medication Question >> Jan 08, 2024  3:24 PM Rozanna G wrote: Reason for CRM: PT HAS QUESTIONS ABOUT HER OXYGEN  SHE IS A LITTLE CONFUSED ABOUT WHO SUPPLIES HER OXYGEN . STATED SHE CALLED [facility] AND THEY HAVE NOT CONTACTED HER BACK YET ABOUT WHAT SHE SHOULD DO.

## 2024-01-08 NOTE — Telephone Encounter (Signed)
 I spoke with Remonia with [facility] back on 12/01/23 and she confirmed she had the POC  Eval order. I am emailing it to [facility] this time

## 2024-01-09 DIAGNOSIS — R29898 Other symptoms and signs involving the musculoskeletal system: Secondary | ICD-10-CM | POA: Diagnosis not present

## 2024-01-09 DIAGNOSIS — M6281 Muscle weakness (generalized): Secondary | ICD-10-CM | POA: Diagnosis not present

## 2024-01-09 DIAGNOSIS — R41841 Cognitive communication deficit: Secondary | ICD-10-CM | POA: Diagnosis not present

## 2024-01-09 DIAGNOSIS — R2681 Unsteadiness on feet: Secondary | ICD-10-CM | POA: Diagnosis not present

## 2024-01-09 DIAGNOSIS — M5459 Other low back pain: Secondary | ICD-10-CM | POA: Diagnosis not present

## 2024-01-09 NOTE — Telephone Encounter (Signed)
 I received an email from [facility] today they had the delivery ticket attached see below

## 2024-01-10 DIAGNOSIS — R2681 Unsteadiness on feet: Secondary | ICD-10-CM | POA: Diagnosis not present

## 2024-01-10 DIAGNOSIS — R41841 Cognitive communication deficit: Secondary | ICD-10-CM | POA: Diagnosis not present

## 2024-01-10 DIAGNOSIS — M6281 Muscle weakness (generalized): Secondary | ICD-10-CM | POA: Diagnosis not present

## 2024-01-10 DIAGNOSIS — R29898 Other symptoms and signs involving the musculoskeletal system: Secondary | ICD-10-CM | POA: Diagnosis not present

## 2024-01-10 DIAGNOSIS — M5459 Other low back pain: Secondary | ICD-10-CM | POA: Diagnosis not present

## 2024-01-11 DIAGNOSIS — R29898 Other symptoms and signs involving the musculoskeletal system: Secondary | ICD-10-CM | POA: Diagnosis not present

## 2024-01-11 DIAGNOSIS — R2681 Unsteadiness on feet: Secondary | ICD-10-CM | POA: Diagnosis not present

## 2024-01-11 DIAGNOSIS — M5459 Other low back pain: Secondary | ICD-10-CM | POA: Diagnosis not present

## 2024-01-11 DIAGNOSIS — M6281 Muscle weakness (generalized): Secondary | ICD-10-CM | POA: Diagnosis not present

## 2024-01-11 DIAGNOSIS — R41841 Cognitive communication deficit: Secondary | ICD-10-CM | POA: Diagnosis not present

## 2024-01-12 DIAGNOSIS — R2681 Unsteadiness on feet: Secondary | ICD-10-CM | POA: Diagnosis not present

## 2024-01-12 DIAGNOSIS — R29898 Other symptoms and signs involving the musculoskeletal system: Secondary | ICD-10-CM | POA: Diagnosis not present

## 2024-01-12 DIAGNOSIS — M5459 Other low back pain: Secondary | ICD-10-CM | POA: Diagnosis not present

## 2024-01-12 DIAGNOSIS — R41841 Cognitive communication deficit: Secondary | ICD-10-CM | POA: Diagnosis not present

## 2024-01-12 DIAGNOSIS — M6281 Muscle weakness (generalized): Secondary | ICD-10-CM | POA: Diagnosis not present

## 2024-01-15 DIAGNOSIS — M6281 Muscle weakness (generalized): Secondary | ICD-10-CM | POA: Diagnosis not present

## 2024-01-15 DIAGNOSIS — R29898 Other symptoms and signs involving the musculoskeletal system: Secondary | ICD-10-CM | POA: Diagnosis not present

## 2024-01-15 DIAGNOSIS — M5459 Other low back pain: Secondary | ICD-10-CM | POA: Diagnosis not present

## 2024-01-15 DIAGNOSIS — R41841 Cognitive communication deficit: Secondary | ICD-10-CM | POA: Diagnosis not present

## 2024-01-15 DIAGNOSIS — R2681 Unsteadiness on feet: Secondary | ICD-10-CM | POA: Diagnosis not present

## 2024-01-16 ENCOUNTER — Ambulatory Visit: Payer: Self-pay

## 2024-01-16 ENCOUNTER — Other Ambulatory Visit: Payer: Self-pay | Admitting: Sports Medicine

## 2024-01-16 DIAGNOSIS — M6281 Muscle weakness (generalized): Secondary | ICD-10-CM | POA: Diagnosis not present

## 2024-01-16 DIAGNOSIS — R41841 Cognitive communication deficit: Secondary | ICD-10-CM | POA: Diagnosis not present

## 2024-01-16 DIAGNOSIS — R29898 Other symptoms and signs involving the musculoskeletal system: Secondary | ICD-10-CM | POA: Diagnosis not present

## 2024-01-16 DIAGNOSIS — M5459 Other low back pain: Secondary | ICD-10-CM | POA: Diagnosis not present

## 2024-01-16 DIAGNOSIS — R2681 Unsteadiness on feet: Secondary | ICD-10-CM | POA: Diagnosis not present

## 2024-01-16 MED ORDER — MAGNESIUM GLYCINATE 100 MG PO CAPS: 2.0000 | ORAL_CAPSULE | Freq: Every day | ORAL | 0 refills | Status: DC

## 2024-01-16 MED ORDER — MAGNESIUM GLYCINATE 100 MG PO CAPS: 2.0000 | ORAL_CAPSULE | Freq: Every day | ORAL | 3 refills | Status: DC

## 2024-01-16 NOTE — Telephone Encounter (Signed)
 FYI Only or Action Required?: Action required by provider: request call back to pt..  Patient was last seen in primary care on 12/29/2023 by [name], MD.  Called Nurse Triage reporting Diarrhea. - Diarrhea started soon after changing  from magnesium  glycinate to magnesium  oxide. Pt is wondering if this is causing diarrhea.  Symptoms began several weeks ago.  Interventions attempted: Nothing.  Symptoms are: stable.  Triage Disposition: See Physician Within 24 Hours  Patient/caregiver understands and will follow disposition?: Yes              Summary: Severe diarrhea   Patient goes by [place], she would like callback to discuss magnesium . She stated that she used to take magnesium  glycinate and was switched to magnesium  oxide but is now having issues with severe diarrhea daily.       Reason for Disposition  [1] MODERATE diarrhea (e.g., 4-6 times / day more than normal) AND [2] age > 70 years  Answer Assessment - Initial Assessment Questions 1. DIARRHEA SEVERITY: How bad is the diarrhea? How many more stools have you had in the past 24 hours than normal?      Pt now wears a pad and needs depends at night 2. ONSET: When did the diarrhea begin?      Soon after July 7th 3. STOOL DESCRIPTION:  How loose or watery is the diarrhea? What is the stool color? Is there any blood or mucous in the stool?     Loose 4. VOMITING: Are you also vomiting? If Yes, ask: How many times in the past 24 hours?      no 5. ABDOMEN PAIN: Are you having any abdomen pain? If Yes, ask: What does it feel like? (e.g., crampy, dull, intermittent, constant)      no 6. ABDOMEN PAIN SEVERITY: If present, ask: How bad is the pain?  (e.g., Scale 1-10; mild, moderate, or severe)     na 7. ORAL INTAKE: If vomiting, Have you been able to drink liquids? How much liquids have you had in the past 24 hours?     yes 8. HYDRATION: Any signs of dehydration? (e.g., dry mouth [not  just dry lips], too weak to stand, dizziness, new weight loss) When did you last urinate?     no 9. EXPOSURE: Have you traveled to a foreign country recently? Have you been exposed to anyone with diarrhea? Could you have eaten any food that was spoiled?     no 10. ANTIBIOTIC USE: Are you taking antibiotics now or have you taken antibiotics in the past 2 months?       no 11. OTHER SYMPTOMS: Do you have any other symptoms? (e.g., fever, blood in stool)       no  Protocols used: Diarrhea-A-AH

## 2024-01-16 NOTE — Addendum Note (Signed)
 Addended by: Romell Cavanah E on: 01/16/2024 02:51 PM   Modules accepted: Orders

## 2024-01-16 NOTE — Telephone Encounter (Signed)
 I called patient back and she states that she was taking Magnesium  Glyinate 200mg  one tablet daily. She states that somehow she got Magnesium  Oxide which is 480mg  per tablet and she's been taking 2 tablets daily. She said that this has caused her severe diarrhea and she's going to STOP taking this form of the medication. However she will resume her regular Magnesium  Glyinate. I have called [facility] and they dont have Magnesium  on patient list at all. I called [facility] on [address] and they stated that patient has Magnesium  on prescription list however its over the counter. The pharmacist stated that Oxide is usually used as a stool softener and with it being a higher dose this probably caused the diarrhea. I called patient back and notified her of what pharmacy told me. She wants Magnesium  Glycinate sent into [facility] so they can have prescription on her record. Medication only comes in 120mg , and 100mg . I have pended the 100mg  for 2 capsules dailyto equal 200mg . Medication pend and sent to covering provider Roxan Plough, NP for approval.

## 2024-01-16 NOTE — Telephone Encounter (Signed)
 Yes,discontinue magnesium  oxide as stated.

## 2024-01-16 NOTE — Telephone Encounter (Signed)
 High Risk Warning Populated when attempting to refill, I will send to Provider for further review

## 2024-01-16 NOTE — Addendum Note (Signed)
 Addended byBETHA LEONARDA BURDOCK C on: 01/16/2024 05:41 PM   Modules accepted: Orders

## 2024-01-16 NOTE — Telephone Encounter (Signed)
 I called patient to offer appointment for today. Unable to leave voicemail due to phone consistently ringing and stating that call cannot be completed as dialed.

## 2024-01-17 ENCOUNTER — Telehealth: Payer: Self-pay

## 2024-01-17 DIAGNOSIS — R41841 Cognitive communication deficit: Secondary | ICD-10-CM | POA: Diagnosis not present

## 2024-01-17 DIAGNOSIS — M6281 Muscle weakness (generalized): Secondary | ICD-10-CM | POA: Diagnosis not present

## 2024-01-17 DIAGNOSIS — M5459 Other low back pain: Secondary | ICD-10-CM | POA: Diagnosis not present

## 2024-01-17 DIAGNOSIS — R2681 Unsteadiness on feet: Secondary | ICD-10-CM | POA: Diagnosis not present

## 2024-01-17 DIAGNOSIS — R29898 Other symptoms and signs involving the musculoskeletal system: Secondary | ICD-10-CM | POA: Diagnosis not present

## 2024-01-17 NOTE — Telephone Encounter (Signed)
 Prescription for medical equipment renewal (02 concentrator) form faxed to [facility]. Received fax confirmation.

## 2024-01-17 NOTE — Telephone Encounter (Signed)
 Called patient and notified her that Magnesium  was sent into pharmacy along with other refills from PCP [name], MD

## 2024-01-18 DIAGNOSIS — R41841 Cognitive communication deficit: Secondary | ICD-10-CM | POA: Diagnosis not present

## 2024-01-18 DIAGNOSIS — M6281 Muscle weakness (generalized): Secondary | ICD-10-CM | POA: Diagnosis not present

## 2024-01-18 DIAGNOSIS — R2681 Unsteadiness on feet: Secondary | ICD-10-CM | POA: Diagnosis not present

## 2024-01-18 DIAGNOSIS — M5459 Other low back pain: Secondary | ICD-10-CM | POA: Diagnosis not present

## 2024-01-18 DIAGNOSIS — R29898 Other symptoms and signs involving the musculoskeletal system: Secondary | ICD-10-CM | POA: Diagnosis not present

## 2024-01-22 ENCOUNTER — Other Ambulatory Visit: Payer: Self-pay | Admitting: Internal Medicine

## 2024-01-22 DIAGNOSIS — R2681 Unsteadiness on feet: Secondary | ICD-10-CM | POA: Diagnosis not present

## 2024-01-22 DIAGNOSIS — R41841 Cognitive communication deficit: Secondary | ICD-10-CM | POA: Diagnosis not present

## 2024-01-22 DIAGNOSIS — M6281 Muscle weakness (generalized): Secondary | ICD-10-CM | POA: Diagnosis not present

## 2024-01-22 DIAGNOSIS — M5459 Other low back pain: Secondary | ICD-10-CM | POA: Diagnosis not present

## 2024-01-22 DIAGNOSIS — R29898 Other symptoms and signs involving the musculoskeletal system: Secondary | ICD-10-CM | POA: Diagnosis not present

## 2024-01-22 NOTE — Telephone Encounter (Signed)
 Copied from CRM #[id number]. Topic: Clinical - Medication Refill >> Jan 22, 2024  4:42 PM Devaughn RAMAN wrote: Medication: budesonide -formoterol  (SYMBICORT ) 160-4.5 MCG/ACT inhaler   Has the patient contacted their pharmacy? No (Agent: If no, request that the patient contact the pharmacy for the refill. If patient does not wish to contact the pharmacy document the reason why and proceed with request.) (Agent: If yes, when and what did the pharmacy advise?)  This is the patient's preferred pharmacy:  [facility] [place], [place] - [address] [place] [address] Phone: [phone] Fax: [phone]  Is this the correct pharmacy for this prescription? Yes If no, delete pharmacy and type the correct one.   Has the prescription been filled recently? No  Is the patient out of the medication? Yes  Has the patient been seen for an appointment in the last year OR does the patient have an upcoming appointment? Yes  Can we respond through MyChart? Yes  Agent: Please be advised that Rx refills may take up to 3 business days. We ask that you follow-up with your pharmacy.

## 2024-01-23 DIAGNOSIS — R29898 Other symptoms and signs involving the musculoskeletal system: Secondary | ICD-10-CM | POA: Diagnosis not present

## 2024-01-23 DIAGNOSIS — R41841 Cognitive communication deficit: Secondary | ICD-10-CM | POA: Diagnosis not present

## 2024-01-23 DIAGNOSIS — M6281 Muscle weakness (generalized): Secondary | ICD-10-CM | POA: Diagnosis not present

## 2024-01-23 DIAGNOSIS — M5459 Other low back pain: Secondary | ICD-10-CM | POA: Diagnosis not present

## 2024-01-23 DIAGNOSIS — R2681 Unsteadiness on feet: Secondary | ICD-10-CM | POA: Diagnosis not present

## 2024-01-23 MED ORDER — BUDESONIDE-FORMOTEROL FUMARATE 160-4.5 MCG/ACT IN AERO: 2.0000 | INHALATION_SPRAY | Freq: Two times a day (BID) | RESPIRATORY_TRACT | 12 refills | Status: DC

## 2024-01-24 DIAGNOSIS — R2681 Unsteadiness on feet: Secondary | ICD-10-CM | POA: Diagnosis not present

## 2024-01-24 DIAGNOSIS — R29898 Other symptoms and signs involving the musculoskeletal system: Secondary | ICD-10-CM | POA: Diagnosis not present

## 2024-01-24 DIAGNOSIS — M6281 Muscle weakness (generalized): Secondary | ICD-10-CM | POA: Diagnosis not present

## 2024-01-24 DIAGNOSIS — R41841 Cognitive communication deficit: Secondary | ICD-10-CM | POA: Diagnosis not present

## 2024-01-24 DIAGNOSIS — M5459 Other low back pain: Secondary | ICD-10-CM | POA: Diagnosis not present

## 2024-01-25 DIAGNOSIS — R2681 Unsteadiness on feet: Secondary | ICD-10-CM | POA: Diagnosis not present

## 2024-01-25 DIAGNOSIS — R41841 Cognitive communication deficit: Secondary | ICD-10-CM | POA: Diagnosis not present

## 2024-01-25 DIAGNOSIS — M5459 Other low back pain: Secondary | ICD-10-CM | POA: Diagnosis not present

## 2024-01-25 DIAGNOSIS — M6281 Muscle weakness (generalized): Secondary | ICD-10-CM | POA: Diagnosis not present

## 2024-01-25 DIAGNOSIS — R29898 Other symptoms and signs involving the musculoskeletal system: Secondary | ICD-10-CM | POA: Diagnosis not present

## 2024-01-26 DIAGNOSIS — R41841 Cognitive communication deficit: Secondary | ICD-10-CM | POA: Diagnosis not present

## 2024-01-26 DIAGNOSIS — M5459 Other low back pain: Secondary | ICD-10-CM | POA: Diagnosis not present

## 2024-01-26 DIAGNOSIS — R29898 Other symptoms and signs involving the musculoskeletal system: Secondary | ICD-10-CM | POA: Diagnosis not present

## 2024-01-26 DIAGNOSIS — M6281 Muscle weakness (generalized): Secondary | ICD-10-CM | POA: Diagnosis not present

## 2024-01-26 DIAGNOSIS — R2681 Unsteadiness on feet: Secondary | ICD-10-CM | POA: Diagnosis not present

## 2024-01-27 ENCOUNTER — Encounter: Payer: Self-pay | Admitting: Internal Medicine

## 2024-01-29 ENCOUNTER — Other Ambulatory Visit: Payer: Self-pay | Admitting: Sports Medicine

## 2024-01-29 DIAGNOSIS — R2681 Unsteadiness on feet: Secondary | ICD-10-CM | POA: Diagnosis not present

## 2024-01-29 DIAGNOSIS — M6281 Muscle weakness (generalized): Secondary | ICD-10-CM | POA: Diagnosis not present

## 2024-01-29 DIAGNOSIS — R29898 Other symptoms and signs involving the musculoskeletal system: Secondary | ICD-10-CM | POA: Diagnosis not present

## 2024-01-29 DIAGNOSIS — R41841 Cognitive communication deficit: Secondary | ICD-10-CM | POA: Diagnosis not present

## 2024-01-29 DIAGNOSIS — M5459 Other low back pain: Secondary | ICD-10-CM | POA: Diagnosis not present

## 2024-01-29 NOTE — Telephone Encounter (Signed)
 Copied from CRM [id number]. Topic: Clinical - Medication Refill >> Jan 29, 2024  9:23 AM Merlynn A wrote: Medication: triamcinolone  cream (KENALOG ) 0.1 %  Has the patient contacted their pharmacy? No (Agent: If no, request that the patient contact the pharmacy for the refill. If patient does not wish to contact the pharmacy document the reason why and proceed with request.) (Agent: If yes, when and what did the pharmacy advise?)  Patient contacted pharmacy and was advised to contact provider to request refill.   This is the patient's preferred pharmacy:  [facility] [place], [place] - [address] [place] [address] Phone: [phone] Fax: [phone]  Is this the correct pharmacy for this prescription? Yes If no, delete pharmacy and type the correct one.   Has the prescription been filled recently? No  Is the patient out of the medication? Yes  Has the patient been seen for an appointment in the last year OR does the patient have an upcoming appointment? Yes  Can we respond through MyChart? Yes  Agent: Please be advised that Rx refills may take up to 3 business days. We ask that you follow-up with your pharmacy.

## 2024-01-29 NOTE — Telephone Encounter (Signed)
 Copied from CRM #[id number]. Topic: Clinical - Medication Refill >> Jan 29, 2024  9:26 AM Merlynn A wrote: Medication: triamcinolone  cream (KENALOG ) 0.1 %  Has the patient contacted their pharmacy? Yes (Agent: If no, request that the patient contact the pharmacy for the refill. If patient does not wish to contact the pharmacy document the reason why and proceed with request.) (Agent: If yes, when and what did the pharmacy advise?)  Patient was advised to contact provider to request refill.   This is the patient's preferred pharmacy:  [facility] [place], [place] - [address] [place] [address] Phone: [phone] Fax: [phone]  Is this the correct pharmacy for this prescription? Yes If no, delete pharmacy and type the correct one.   Has the prescription been filled recently? No  Is the patient out of the medication? Yes  Has the patient been seen for an appointment in the last year OR does the patient have an upcoming appointment? Yes  Can we respond through MyChart? Yes  Agent: Please be advised that Rx refills may take up to 3 business days. We ask that you follow-up with your pharmacy.

## 2024-01-30 ENCOUNTER — Telehealth: Payer: Self-pay

## 2024-01-30 ENCOUNTER — Other Ambulatory Visit: Payer: Self-pay | Admitting: Sports Medicine

## 2024-01-30 DIAGNOSIS — S6991XA Unspecified injury of right wrist, hand and finger(s), initial encounter: Secondary | ICD-10-CM | POA: Diagnosis not present

## 2024-01-30 DIAGNOSIS — S8992XA Unspecified injury of left lower leg, initial encounter: Secondary | ICD-10-CM | POA: Diagnosis not present

## 2024-01-30 DIAGNOSIS — S8991XA Unspecified injury of right lower leg, initial encounter: Secondary | ICD-10-CM | POA: Diagnosis not present

## 2024-01-30 DIAGNOSIS — S6992XA Unspecified injury of left wrist, hand and finger(s), initial encounter: Secondary | ICD-10-CM | POA: Diagnosis not present

## 2024-01-30 NOTE — Telephone Encounter (Signed)
 Attempted to call patient. Left voicemail for patient to call our office to get scheduled for walk with Candis Dandy.

## 2024-01-30 NOTE — Telephone Encounter (Unsigned)
 Copied from CRM #[id number]. Topic: Clinical - Medication Refill >> Jan 30, 2024 10:12 AM Mercer PEDLAR wrote: Medication: traMADol  (ULTRAM ) 50 MG tablet midodrine  (PROAMATINE ) 5 MG tablet  Has the patient contacted their pharmacy? Yes (Agent: If no, request that the patient contact the pharmacy for the refill. If patient does not wish to contact the pharmacy document the reason why and proceed with request.) (Agent: If yes, when and what did the pharmacy advise?)  This is the patient's preferred pharmacy:  [facility] [place], [place] - [address] [place] [address] Phone: [phone] Fax: [phone]  Is this the correct pharmacy for this prescription? Yes If no, delete pharmacy and type the correct one.   Has the prescription been filled recently? No  Is the patient out of the medication? Yes  Has the patient been seen for an appointment in the last year OR does the patient have an upcoming appointment? Yes  Can we respond through MyChart? No  Agent: Please be advised that Rx refills may take up to 3 business days. We ask that you follow-up with your pharmacy.

## 2024-01-30 NOTE — Telephone Encounter (Signed)
 Here is your answer from [employer] I spoke with [place] in [place] [employer] she states the patient wants to keep both POC and the E tanks. Insurance will not pay for both. Patient called [employer] on 01/11/24 and ask them to pick up her POC she wasn't paying for both of them. There is no need to place order for POC she will not give up her E tanks

## 2024-01-30 NOTE — Telephone Encounter (Unsigned)
 Copied from CRM [id number]. Topic: Clinical - Order For Equipment >> Jan 30, 2024 12:05 PM Russell PARAS wrote: Reason for CRM:   Pt is requesting new order for POC to be sent to [facility]. She typically uses oxygen  tanks; however, tried her friend's POC and found it very beneficial and more reliable than waiting for [employer] to send her oxygen  tanks.   She was advised by [employer] that she could not have both oxygen  tanks and the POC and would need to select one or the other.  Pt requested call back to speak more about order  CB#  [phone] >> Jan 30, 2024  1:46 PM Brittney F wrote: Wanted to ensure the contact information for [employer] was attached.  [email or web address] [address], [place], [place] [address] [phone]

## 2024-01-30 NOTE — Telephone Encounter (Signed)
 Copied from CRM [id number]. Topic: Clinical - Order For Equipment >> Jan 30, 2024 12:05 PM Russell PARAS wrote: Reason for CRM:   Pt is requesting new order for POC to be sent to [facility]. She typically uses oxygen  tanks; however, tried her friend's POC and found it very beneficial and more reliable than waiting for [employer] to send her oxygen  tanks.   She was advised by [employer] that she could not have both oxygen  tanks and the POC and would need to select one or the other.  Pt requested call back to speak more about order  CB#  [phone] >> Jan 30, 2024  1:46 PM Brittney F wrote: Wanted to ensure the contact information for [employer] was attached.  [email or web address] [address], [place], [place] [address] [phone]  ATC x1 LVM for patient to call our office back .

## 2024-01-31 ENCOUNTER — Other Ambulatory Visit: Payer: Self-pay | Admitting: Sports Medicine

## 2024-01-31 DIAGNOSIS — R29898 Other symptoms and signs involving the musculoskeletal system: Secondary | ICD-10-CM | POA: Diagnosis not present

## 2024-01-31 DIAGNOSIS — M6281 Muscle weakness (generalized): Secondary | ICD-10-CM | POA: Diagnosis not present

## 2024-01-31 DIAGNOSIS — J45991 Cough variant asthma: Secondary | ICD-10-CM

## 2024-01-31 DIAGNOSIS — R41841 Cognitive communication deficit: Secondary | ICD-10-CM | POA: Diagnosis not present

## 2024-01-31 DIAGNOSIS — R2681 Unsteadiness on feet: Secondary | ICD-10-CM | POA: Diagnosis not present

## 2024-01-31 DIAGNOSIS — M5459 Other low back pain: Secondary | ICD-10-CM | POA: Diagnosis not present

## 2024-01-31 MED ORDER — BENZONATATE 200 MG PO CAPS: 200.0000 mg | ORAL_CAPSULE | Freq: Three times a day (TID) | ORAL | 0 refills | Status: DC | PRN

## 2024-01-31 NOTE — Telephone Encounter (Signed)
 Copied from CRM #[id number]. Topic: Clinical - Medication Refill >> Jan 31, 2024  1:47 PM Miquel SAILOR wrote: Medication: benzonatate  (TESSALON ) 200 MG capsule   Has the patient contacted their pharmacy? Yes (Agent: If no, request that the patient contact the pharmacy for the refill. If patient does not wish to contact the pharmacy document the reason why and proceed with request.) (Agent: If yes, when and what did the pharmacy advise?)  This is the patient's preferred pharmacy:  [facility] [place], [place] - [address] [place] [address] Phone: [phone] Fax: [phone]  Is this the correct pharmacy for this prescription? Yes If no, delete pharmacy and type the correct one.   Has the prescription been filled recently? Yes  Is the patient out of the medication? Yes  Has the patient been seen for an appointment in the last year OR does the patient have an upcoming appointment? Yes  Can we respond through MyChart? Yes  Agent: Please be advised that Rx refills may take up to 3 business days. We ask that you follow-up with your pharmacy.

## 2024-01-31 NOTE — Telephone Encounter (Signed)
 Medication has never been refilled by patients pcp [name], MD . Jerel to the provider for further approval.

## 2024-01-31 NOTE — Telephone Encounter (Unsigned)
 Copied from CRM [id number]. Topic: Clinical - Medication Refill >> Jan 31, 2024 10:57 AM Carmell R wrote: Medication: buPROPion  (WELLBUTRIN  XL) 300 MG 24 hr tablet  Has the patient contacted their pharmacy? No, but has a note that it needs to be reordered  This is the patient's preferred pharmacy:  [facility] [place], [place] - [address] [place] [address] Phone: [phone] Fax: [phone]  Is this the correct pharmacy for this prescription? Yes  Has the prescription been filled recently? Yes, back in April  Is the patient out of the medication? No. Will be out next week  Has the patient been seen for an appointment in the last year OR does the patient have an upcoming appointment? Yes  Can we respond through MyChart? Yes  Agent: Please be advised that Rx refills may take up to 3 business days. We ask that you follow-up with your pharmacy.

## 2024-02-01 MED ORDER — BUPROPION HCL ER (XL) 300 MG PO TB24: 300.0000 mg | ORAL_TABLET | Freq: Every day | ORAL | 0 refills | Status: DC

## 2024-02-01 MED ORDER — TRAMADOL HCL 50 MG PO TABS
50.0000 mg | ORAL_TABLET | Freq: Two times a day (BID) | ORAL | 0 refills | Status: DC | PRN
Start: 2024-02-01 — End: 2024-02-09

## 2024-02-01 MED ORDER — MIDODRINE HCL 5 MG PO TABS: 5.0000 mg | ORAL_TABLET | Freq: Every day | ORAL | 0 refills | Status: DC

## 2024-02-02 DIAGNOSIS — M5459 Other low back pain: Secondary | ICD-10-CM | POA: Diagnosis not present

## 2024-02-02 DIAGNOSIS — R29898 Other symptoms and signs involving the musculoskeletal system: Secondary | ICD-10-CM | POA: Diagnosis not present

## 2024-02-02 DIAGNOSIS — M6281 Muscle weakness (generalized): Secondary | ICD-10-CM | POA: Diagnosis not present

## 2024-02-02 DIAGNOSIS — R2681 Unsteadiness on feet: Secondary | ICD-10-CM | POA: Diagnosis not present

## 2024-02-02 DIAGNOSIS — R41841 Cognitive communication deficit: Secondary | ICD-10-CM | POA: Diagnosis not present

## 2024-02-02 NOTE — Telephone Encounter (Signed)
 See telephone encounter 01/30/24- this has already been addressed.

## 2024-02-05 DIAGNOSIS — R41841 Cognitive communication deficit: Secondary | ICD-10-CM | POA: Diagnosis not present

## 2024-02-07 ENCOUNTER — Telehealth (HOSPITAL_BASED_OUTPATIENT_CLINIC_OR_DEPARTMENT_OTHER): Payer: Self-pay

## 2024-02-07 DIAGNOSIS — J9611 Chronic respiratory failure with hypoxia: Secondary | ICD-10-CM

## 2024-02-07 DIAGNOSIS — R41841 Cognitive communication deficit: Secondary | ICD-10-CM | POA: Diagnosis not present

## 2024-02-07 NOTE — Telephone Encounter (Signed)
 Copied from CRM [id number]. Topic: General - Other >> Feb 07, 2024  2:01 PM [place] S wrote: Reason for CRM: patient is calling because she received a portable oxygen  concentrator before and she didn't know what is was at first so she sent it back to [employer], she tried to get it back but [employer] made it difficult for her, she is asking if dr wert can put in an order for a portable oxygen  concentrator to a different company because [employer] gave her a bad experience and she is in urgent need of that concentrator. Please call patient to further explain everything to her.

## 2024-02-08 NOTE — Telephone Encounter (Signed)
 Spoke with patient and updated on some of the limitations on whether we could get the POC and E-Tank. She did state that the smartest option for her would be the POC if it came down to choosing. She is on oxygen  almost 100% of the time and it is coming down to needing her POC.   She did request, if possible, she would be happy to depart from [facility] and continue with another company that can communicate and fulfill her items for her correctly.   I did let her know that we will continue to work on this and I am following up with her by noon tomorrow (09/05)

## 2024-02-08 NOTE — Telephone Encounter (Signed)
 Copied from CRM #[id number]. Topic: Clinical - Order For Equipment >> Feb 08, 2024  3:07 PM Shelly [name] wrote: Reason for CRM: Pt is calling back in regards to the previous encounter message from today Patient calling to claim that she needs this POC before she is able to do anything at all going forward. Informed patient that message was sent yesterday afternoon. Patient is demanding an update today regarding the POC.  Pt is wanting to speak to someone regarding the order, but was not able to speak to the CMA who called her today due to being in the store.   I called [facility] and Leonor stated she would be reaching out to [facility] for assistance with this matter. The pt did not want to hold on the line any longer and is requesting a call at [phone] ok to leave a vm.   Calling patient to update further between having a POC and an E-tank. Insurance will not be able to cover both.   Routing to [facility] as well to assist on getting's patient POC as soon as possible.

## 2024-02-08 NOTE — Telephone Encounter (Signed)
 I see that the order was worked on by [employer]. Routing to [place] to advise patient.

## 2024-02-09 ENCOUNTER — Encounter: Payer: Self-pay | Admitting: Sports Medicine

## 2024-02-09 ENCOUNTER — Non-Acute Institutional Stay: Admitting: Sports Medicine

## 2024-02-09 VITALS — BP 124/80 | HR 90 | Temp 98.2°F | Ht 65.0 in | Wt 151.0 lb

## 2024-02-09 DIAGNOSIS — R4189 Other symptoms and signs involving cognitive functions and awareness: Secondary | ICD-10-CM | POA: Diagnosis not present

## 2024-02-09 DIAGNOSIS — I959 Hypotension, unspecified: Secondary | ICD-10-CM

## 2024-02-09 DIAGNOSIS — R238 Other skin changes: Secondary | ICD-10-CM | POA: Diagnosis not present

## 2024-02-09 DIAGNOSIS — M159 Polyosteoarthritis, unspecified: Secondary | ICD-10-CM | POA: Diagnosis not present

## 2024-02-09 DIAGNOSIS — R41841 Cognitive communication deficit: Secondary | ICD-10-CM | POA: Diagnosis not present

## 2024-02-09 DIAGNOSIS — J9611 Chronic respiratory failure with hypoxia: Secondary | ICD-10-CM

## 2024-02-09 MED ORDER — TRAMADOL HCL 50 MG PO TABS: 50.0000 mg | ORAL_TABLET | Freq: Every day | ORAL | Status: DC | PRN

## 2024-02-09 MED ORDER — HYDROCORTISONE 2.5 % EX CREA: TOPICAL_CREAM | Freq: Two times a day (BID) | CUTANEOUS | 3 refills | Status: AC

## 2024-02-09 NOTE — Telephone Encounter (Signed)
 Spoke with [employer] in [place] and was able to clarify that patient will be unable to transfer DME's at this time and she is only able to receive one of the two oxygen  options.   As stated before patient stated that if she had to choose and for coverage purposed she would need the POC. She is concerned of her supply. I have not been successful to reach [employer] [place] by phone to see if they are able to fulfill her order.   Can you all please submit another urgent order to [employer]?

## 2024-02-09 NOTE — Progress Notes (Signed)
 Careteam: Patient Care Team: Shelly Madden, MD as PCP - General (Internal Medicine) Shelly [name] LABOR, MD as PCP - Cardiology (Cardiology) Shelly Ade, MD as Consulting Physician (Physical Medicine and Rehabilitation)  PLACE OF SERVICE:  [facility]  Advanced Directive information    Allergies  Allergen Reactions   Delsym [Dextromethorphan] Other (See Comments)    Cognitive impairment    Chief Complaint  Patient presents with   Medication Managment    Request to discuss medications and skin reaction to one of meds. Discuss time of day to take protonix . Discuss all medications marked as unknown and requesting a copy of updated medication list.                             [facility]    Discussed the use of AI scribe software for clinical note transcription with the patient, who gave verbal consent to proceed.  History of Present Illness  Shelly [name] is a 73 year old female who presents with concerns about medication management and pain.  She is experiencing difficulty managing her medications and has questions about their purposes. Her current medications include midodrine  for blood pressure management, Singulair , a cholesterol medication, Eliquis , melatonin for sleep, donepezil  for memory, fluoxetine  for depression, and tramadol  for pain. She previously took magnesium  supplements but stopped due to diarrhea.  She reports significant pain in her low back, shoulders, and neck, which worsened after a fall where she landed on her hands, causing bruising and soreness. She takes tramadol  once in the morning for pain but does not find it significantly effective. She uses Tylenol  for everyday pain management.  She has been experiencing a rash in the vaginal and anal areas, likely due to diarrhea and the use of Depends. She has been cleaning the area thoroughly and is working on resolving the rash.  She has concerns about her breathing and oxygen  supply  management. She is currently using  2 lit supplemental o2. She has been managing her oxygen  use to prevent running out and reports feeling better at the current setting. No fever, wheezing, or coughing, and she is able to walk to the dining hall.  She has experienced memory issues and confusion in the past, which affected her understanding of her oxygen  equipment. She is now more aware and informed about her needs.    Review of Systems:  Review of Systems  Constitutional:  Negative for chills and fever.  HENT:  Negative for congestion and sore throat.   Eyes:  Negative for double vision.  Respiratory:  Positive for shortness of breath (exertional). Negative for cough and sputum production.   Cardiovascular:  Negative for chest pain, palpitations and leg swelling.  Gastrointestinal:  Negative for abdominal pain, heartburn and nausea.  Genitourinary:  Negative for dysuria, frequency and hematuria.  Musculoskeletal:  Positive for back pain and falls. Negative for myalgias.  Neurological:  Negative for dizziness.   Negative unless indicated in HPI.   Past Medical History:  Diagnosis Date   Anxiety    Arthritis    Asthma    Avascular necrosis of talus (HCC)    Cancer (HCC)    vulva pre cancer had surgery   Depression    Gait abnormality 03/20/2017   GERD (gastroesophageal reflux disease)    Hearing loss    very minor   History of pneumonia    Hyperlipidemia    Hypertension    Leg edema  left   Memory difficulty 03/20/2017   PONV (postoperative nausea and vomiting)    Stress incontinence    Past Surgical History:  Procedure Laterality Date   ANKLE FUSION  2011   right multiple    ANKLE FUSION     rear ankle fusion   ANKLE SURGERY  2010   right cordicompression   AORTIC ARCH ANGIOGRAPHY N/A 12/11/2017   Procedure: AORTIC ARCH ANGIOGRAPHY;  Surgeon: Shelly Carlin BRAVO, MD;  Location: [facility];  Service: Cardiovascular;  Laterality: N/A;   APPENDECTOMY   05/10/2016   BELOW KNEE LEG AMPUTATION     CATARACT EXTRACTION W/ INTRAOCULAR LENS IMPLANT Left    LAPAROSCOPIC APPENDECTOMY N/A 05/10/2016   Procedure: APPENDECTOMY LAPAROSCOPIC;  Surgeon: Shelly Poli, MD;  Location: [facility] OR;  Service: General;  Laterality: N/A;   LUMBAR LAMINECTOMY/DECOMPRESSION MICRODISCECTOMY N/A 09/09/2015   Procedure:  L4-S1 Decompression/ Discetomy;  Surgeon: Shelly Sprang, MD;  Location: [facility] OR;  Service: Orthopedics;  Laterality: N/A;   SKIN GRAFT     SPINAL CORD STIMULATOR BATTERY EXCHANGE  06/23/2023   SPINAL CORD STIMULATOR INSERTION N/A 01/10/2019   Procedure: LUMBAR SPINAL CORD STIMULATOR INSERTION;  Surgeon: [name] Donaciano, MD;  Location: [facility] OR;  Service: Orthopedics;  Laterality: N/A;  2.5 hrs   TONSILLECTOMY     TUBAL LIGATION  1983   VULVECTOMY N/A 01/28/2015   Procedure: WIDE EXCISION VULVECTOMY;  Surgeon: Shelly Paradise, MD;  Location: [facility] OR[facility];  Service: Gynecology;  Laterality: N/A;   Social History:   reports that she quit smoking about 17 years ago. Her smoking use included cigarettes. She started smoking about 57 years ago. She has a 40 pack-year smoking history. She has never used smokeless tobacco. She reports current alcohol  use. She reports that she does not use drugs.  Family History  Problem Relation Age of Onset   Dementia Mother 41       alive   Fibromyalgia Mother    Allergies Mother    Heart attack Father 42       deceased   Multiple myeloma Father    Heart murmur Sister 15       she had open heart surgery   Other Sister 67       She is bIpolar- diabetic   Breast cancer Sister    Other Brother        alive   Heart disease Brother        emergent CABG for 99% blocked CAD    Medications: Patient's Medications  New Prescriptions   No medications on file  Previous Medications   ACETAMINOPHEN  (TYLENOL ) 325 MG SUPPOSITORY    Place 325 mg rectally every 4 (four) hours as needed for moderate pain (pain score 4-6).    ACETAMINOPHEN  (TYLENOL ) 325 MG SUPPOSITORY    Place 325 mg rectally every 4 (four) hours as needed for fever.   ACETAMINOPHEN  (TYLENOL ) 325 MG TABLET    Take 650 mg by mouth as needed.   ACYCLOVIR CREAM (ZOVIRAX) 5 %    Apply 1 Application topically 2 (two) times daily as needed (cold sore).   ALBUTEROL  (PROAIR  HFA) 108 (90 BASE) MCG/ACT INHALER    2 puffs every 4 hours as needed only  if your can't catch your breath   BENZONATATE  (TESSALON ) 200 MG CAPSULE    Take 1 capsule (200 mg total) by mouth 3 (three) times daily as needed for cough.   BUDESONIDE -FORMOTEROL  (SYMBICORT ) 160-4.5 MCG/ACT INHALER    Inhale 2 puffs  into the lungs in the morning and at bedtime.   BUPROPION  (WELLBUTRIN  XL) 300 MG 24 HR TABLET    Take 1 tablet (300 mg total) by mouth daily.   CALCIUM  CARBONATE (OSCAL) 1500 (600 CA) MG TABS TABLET    Take 1 tablet (1,500 mg total) by mouth at bedtime.   DENOSUMAB  (PROLIA ) 60 MG/ML SOLN INJECTION    Inject 60 mg into the skin every 6 (six) months.    DONEPEZIL  (ARICEPT ) 10 MG TABLET    Take 1 tablet (10 mg total) by mouth at bedtime.   ELIQUIS  5 MG TABS TABLET    Take 1 tablet (5 mg total) by mouth 2 (two) times daily.   FLUOXETINE  (PROZAC ) 20 MG CAPSULE    Take 1 capsule (20 mg total) by mouth daily.   FLUTICASONE  (FLONASE ) 50 MCG/ACT NASAL SPRAY    Place 2 sprays into both nostrils daily.   HYDROCORTISONE  CREAM 1 %    Apply 1 Application topically 3 (three) times daily.   IPRATROPIUM-ALBUTEROL  (DUONEB) 0.5-2.5 (3) MG/3ML SOLN    Take 3 mLs by nebulization every 6 (six) hours as needed (Shortness of breath, cough and/or wheeze). Use this or albuterol  inhaler but not both.   MAGNESIUM  GLYCINATE 100 MG CAPS    Take 2 capsules by mouth daily.   MELATONIN 3 MG TABS TABLET    Take 3 mg by mouth at bedtime.   MIDODRINE  (PROAMATINE ) 5 MG TABLET    Take 1 tablet (5 mg total) by mouth daily.   MONTELUKAST  (SINGULAIR ) 10 MG TABLET    Take 1 tablet (10 mg total) by mouth at bedtime.   MULTIPLE  VITAMINS-MINERALS (CENTRUM SILVER WOMEN 50+) TABS    Take 1 tablet by mouth daily.   NYSTATIN CREAM (MYCOSTATIN)    Apply 1 Application topically.   ONDANSETRON  (ZOFRAN -ODT) 8 MG DISINTEGRATING TABLET    Take 8 mg by mouth every 8 (eight) hours as needed for nausea or vomiting.   OXYGEN     Inhale into the lungs. O2 2L, Monitor O2 sat Qshift to maintain O2 Sat >95% every shift   PANTOPRAZOLE  (PROTONIX ) 40 MG TABLET    TAKE 30 TO 60 MINUTES BEFORE THE FIRST AND LAST MEAL OF THE DAY   POLYETHYLENE GLYCOL (MIRALAX  / GLYCOLAX ) 17 G PACKET    Take 17 g by mouth as needed. 1 scoop every 24 hours as needed for constipation   POTASSIUM 99 MG TABS    Take 1 tablet by mouth daily.   QC POTASSIUM 595 (99 K) MG TABS TABLET    Take 1 tablet (99 mg total) by mouth at bedtime.   ROSUVASTATIN  (CRESTOR ) 10 MG TABLET    Take 1 tablet (10 mg total) by mouth every evening.   TRAMADOL  (ULTRAM ) 50 MG TABLET    Take 1 tablet (50 mg total) by mouth every 12 (twelve) hours as needed.   TRIAMCINOLONE  CREAM (KENALOG ) 0.1 %    Apply 1 Application topically.   TURMERIC CURCUMIN 500 MG CAPS    1000 mg 2 tablets Orally once a day   TURMERIC CURCUMIN PO    Take 1,500 mg by mouth daily.   VALACYCLOVIR (VALTREX) 1000 MG TABLET    Take 1,000 mg by mouth as needed. Continue until cold sores resolve.   ZINC  OXIDE (TRIPLE PASTE) 12.8 % OINTMENT    Apply 1 Application topically as needed for irritation.  Modified Medications   No medications on file  Discontinued Medications   No medications  on file    Physical Exam: Vitals:   02/09/24 0857  BP: 124/80  Pulse: 90  Temp: 98.2 F (36.8 C)  TempSrc: Temporal  SpO2: 99%  Weight: 151 lb (68.5 kg)  Height: 5' 5 (1.651 m)   Body mass index is 25.13 kg/m. BP Readings from Last 3 Encounters:  02/09/24 124/80  12/29/23 106/60  12/28/23 110/66   Wt Readings from Last 3 Encounters:  02/09/24 151 lb (68.5 kg)  12/29/23 154 lb (69.9 kg)  12/28/23 155 lb (70.3 kg)     Physical Exam Constitutional:      Appearance: Normal appearance.  HENT:     Head: Normocephalic and atraumatic.  Cardiovascular:     Rate and Rhythm: Normal rate and regular rhythm.     Pulses: Normal pulses.     Heart sounds: Normal heart sounds.  Pulmonary:     Effort: No respiratory distress.     Breath sounds: No stridor. No wheezing or rales.  Abdominal:     General: Bowel sounds are normal. There is no distension.     Palpations: Abdomen is soft.     Tenderness: There is no abdominal tenderness. There is no guarding.  Musculoskeletal:        General: No swelling.  Neurological:     Mental Status: She is alert. Mental status is at baseline.     Motor: No weakness.     Labs reviewed: Basic Metabolic Panel: Recent Labs    08/30/23 1745 08/31/23 0801 09/03/23 0242 09/04/23 0357 09/05/23 0330 09/06/23 0404 09/11/23 0541 09/23/23 9561 09/25/23 0436 09/28/23 0450 10/10/23 0000 10/26/23 0000  NA  --    < >  --  138 140 139   < > 136 137 135 140 140  K  --    < >  --  3.2* 3.2* 3.7   < > 4.0 4.2 3.9 4.3 4.3  CL  --    < >  --  101 104 105   < > 96* 99 96* 103 103  CO2  --    < >  --  26 27 22    < > 29 32 28 31* 31*  GLUCOSE  --    < >  --  150* 117* 95   < > 84 81 92  --   --   BUN  --    < >  --  12 15 16    < > 16 14 20  33* 15  CREATININE 1.18*   < >  --  0.68 0.66 0.73   < > 0.57 0.62 0.69 0.5 0.6  CALCIUM   --    < >  --  7.6* 7.8* 7.9*   < > 8.5* 8.9 8.9 8.3* 8.7  MG  --    < > 1.8 2.0 2.2 1.8  --   --  2.0 1.9  --   --   PHOS  --   --  2.7 2.9 2.3*  --   --   --   --   --   --   --   TSH 1.153  --   --   --   --   --   --   --   --   --   --  0.74   < > = values in this interval not displayed.   Liver Function Tests: Recent Labs    09/03/23 0200 09/05/23 0330 09/06/23 0404 09/11/23 0541 10/26/23 0000  AST 53*  --  42* 29 19  ALT 46*  --  63* 35 15  ALKPHOS 64  --  57 71  --   BILITOT 1.2  --  0.7 0.9  --   PROT 5.9*  --  5.4* 6.0*  --    ALBUMIN  2.5*   < > 2.2* 2.4* 3.2*   < > = values in this interval not displayed.   Recent Labs    04/01/23 1847  LIPASE 14   No results for input(s): AMMONIA in the last 8760 hours. CBC: Recent Labs    09/23/23 0438 09/25/23 0436 09/28/23 0450 10/10/23 0000 10/17/23 0000 10/26/23 0000  WBC 12.9* 13.2* 16.1* 8.9 6.6 6.0  NEUTROABS  --  9.3* 12.6*  --  3,478.00 3,324.00  HGB 9.6* 10.1* 11.0* 10.0* 10.0* 10.1*  HCT 29.7* 31.4* 33.4* 31* 32* 33*  MCV 96.4 96.3 95.4  --   --   --   PLT 310 314 322 234 276 327   Lipid Panel: Recent Labs    10/26/23 0000  CHOL 139  HDL 56  LDLCALC 69  TRIG 62   TSH: Recent Labs    08/30/23 1745 10/26/23 0000  TSH 1.153 0.74   A1C: Lab Results  Component Value Date   HGBA1C 5.8 10/26/2023    Assessment and Plan Assessment & Plan  1. Skin irritation (Primary)  - hydrocortisone  2.5 % cream; Apply topically 2 (two) times daily.  Dispense: 28 g; Refill: 3  2. Generalized OA Pt c/o intermittent shoulder and upper back pain  Instructed to limit tramadol  Take tylenol  650 mg q6 prn for pain Use heating pads, lidocaine  patch - traMADol  (ULTRAM ) 50 MG tablet; Take 1 tablet (50 mg total) by mouth daily as needed.  3. Hypotension, unspecified hypotension type Bp stable Denies dizziness Cont with midodrine   4. Cognitive impairment Increase cognitively engaging activities Cont with aricept   5. Chronic respiratory failure with hypoxia (HCC) Follow up with pulmonology Cont with supplemental o2  Other orders - acetaminophen  (TYLENOL ) 325 MG tablet; Take 650 mg by mouth as needed. - Zinc  Oxide (TRIPLE PASTE) 12.8 % ointment; Apply 1 Application topically as needed for irritation. - Potassium 99 MG TABS; Take 1 tablet by mouth daily.

## 2024-02-09 NOTE — Telephone Encounter (Signed)
 Called patient.  Patient wants a written order for a POC so she can purchase one and pay out of pocket.  Then she will bill this to her insurance company herself.  Placing order for home (written order) for POC.  Per order from 11/22/2023:  POC @ setting of 3L pulsed dose.

## 2024-02-09 NOTE — Patient Instructions (Addendum)
 Hydrocortisone  cream 2.5 % for itching Apply moisturizer twice daily  Use diaper  rash cream

## 2024-02-12 ENCOUNTER — Telehealth: Payer: Self-pay | Admitting: *Deleted

## 2024-02-12 ENCOUNTER — Telehealth: Payer: Self-pay

## 2024-02-12 DIAGNOSIS — R41841 Cognitive communication deficit: Secondary | ICD-10-CM | POA: Diagnosis not present

## 2024-02-12 NOTE — Telephone Encounter (Signed)
 Copied from CRM [id number]. Topic: Clinical - Medication Question >> Feb 09, 2024  2:55 PM Miquel SAILOR wrote: Reason for CRM: Turmeric Curcumin 500 MG CAPS [[id number]] TURMERIC CURCUMIN PO [[id number]] Patient needs clarification on how tp take medication if 2 500mg  ot 1500mg . Called office transferred to [facility]

## 2024-02-12 NOTE — Telephone Encounter (Signed)
 Called patient.  Informed we have rx signed by Dr. Geronimo for  a POC.  Patient requesting we mail the paper rx to her at her home address listed in EPIC.  Patient also wants us  to send her information in MyChart about the written rx as she states her memory is not very good.  Mailed out rx.  Patient verbalized understanding.

## 2024-02-12 NOTE — Telephone Encounter (Signed)
 ATC pt LMTCB PFT does not measure 02.

## 2024-02-12 NOTE — Telephone Encounter (Signed)
 Copied from CRM [id number]. Topic: Clinical - Medication Question >> Feb 12, 2024  1:17 PM Rozanna G wrote: Reason for CRM: Pt is wanting to see the provider or np or app after PFT on 10/24 but no other appt is attached for that day to see him. PT is concerned because she is trying to get portable oxygen  machine. Pt would like a call about this thanks    Spoke w/ patient someone had reach out to her she said she's fine w/ OV she has scheduled for now but, was wondering places that supply poc gave her some names    -NFN

## 2024-02-12 NOTE — Telephone Encounter (Signed)
 Shelly Madden, MD  Lynnann Donzell LABOR, CMA; Suellen Comings C, CMA Only turmeric is there on med list , every thing else we spoke during the visit. I d/c turmeric from her med list.         Dr.Veludandi,  This patient called Friday and had Donzell on the phone after hours expressing her frustrations about her medication list not being up to date on her AVS.  All the medications that were marked as unknown was to be discussed during the visit, it was to be decided if she should or should not be on those medications/supplements, and if not removed from her list.    Please reply to all or Donzell as she is in clinical intake today.  Thanks,  S.Chrae B/CMA

## 2024-02-12 NOTE — Telephone Encounter (Signed)
 Patient notified and agreed.

## 2024-02-14 DIAGNOSIS — R519 Headache, unspecified: Secondary | ICD-10-CM | POA: Diagnosis not present

## 2024-02-14 DIAGNOSIS — H2513 Age-related nuclear cataract, bilateral: Secondary | ICD-10-CM | POA: Diagnosis not present

## 2024-02-14 DIAGNOSIS — H53143 Visual discomfort, bilateral: Secondary | ICD-10-CM | POA: Diagnosis not present

## 2024-02-15 ENCOUNTER — Telehealth: Payer: Self-pay | Admitting: Neurology

## 2024-02-15 ENCOUNTER — Telehealth: Payer: Self-pay

## 2024-02-15 ENCOUNTER — Ambulatory Visit: Admitting: Neurology

## 2024-02-15 DIAGNOSIS — R41841 Cognitive communication deficit: Secondary | ICD-10-CM | POA: Diagnosis not present

## 2024-02-15 NOTE — Telephone Encounter (Signed)
 Pt cx appointment , waiting on delivery of oxygen 

## 2024-02-15 NOTE — Telephone Encounter (Signed)
 Received fax from [employer] and faxed back to [phone] and received success confirmation faxed

## 2024-02-16 DIAGNOSIS — R41841 Cognitive communication deficit: Secondary | ICD-10-CM | POA: Diagnosis not present

## 2024-02-19 DIAGNOSIS — R41841 Cognitive communication deficit: Secondary | ICD-10-CM | POA: Diagnosis not present

## 2024-02-21 ENCOUNTER — Telehealth: Payer: Self-pay

## 2024-02-21 DIAGNOSIS — Z5181 Encounter for therapeutic drug level monitoring: Secondary | ICD-10-CM | POA: Diagnosis not present

## 2024-02-21 DIAGNOSIS — M5416 Radiculopathy, lumbar region: Secondary | ICD-10-CM | POA: Diagnosis not present

## 2024-02-21 DIAGNOSIS — R41841 Cognitive communication deficit: Secondary | ICD-10-CM | POA: Diagnosis not present

## 2024-02-21 NOTE — Telephone Encounter (Signed)
 Patient has upcoming appointment with Mast Man, NP 03/07/2024. Medications can be discussed/confirmed during this appointment and list can be printed during this time. Patient notified to bring all her bottles as previously instructed. Patient was agreeable. Message routed back to PCP [name], MD

## 2024-02-21 NOTE — Telephone Encounter (Signed)
 Copied from CRM [id number]. Topic: General - Other >> Feb 21, 2024 11:31 AM Shelly [name] ORN wrote: Reason for CRM: Patient called in requesting to have her updated medication list mailed to her. She states she likes to keep it handy & wants it emailed to her. Her email is [email or web address].

## 2024-02-21 NOTE — Telephone Encounter (Signed)
 Shelly Madden, MD to [facility] (Selected Message)     02/21/24 12:34 PM Pt did not bring her medications to her recent visit and does not remember the dosages.  I am not sure what we have on her chart is accurate until we verify with med medication bottles, I instructed patient to bring all her meds at her next visit. If you can call Zebedee PEAK at [facility]  to confirm her medication bottles and with our list and call us  for any discrepancies.

## 2024-02-21 NOTE — Telephone Encounter (Signed)
 Received CMN from [employer] for oxygen . CMN signed by Izetta. Faxed and received confirmation.

## 2024-02-21 NOTE — Telephone Encounter (Signed)
 We dont email patient medication list. Message routed to PCP [name], MD to confirm medication list is updated from 02/09/2024. I will print, mail to patient, and call to notify her.

## 2024-02-23 DIAGNOSIS — R41841 Cognitive communication deficit: Secondary | ICD-10-CM | POA: Diagnosis not present

## 2024-02-26 ENCOUNTER — Telehealth: Payer: Self-pay | Admitting: Internal Medicine

## 2024-02-26 DIAGNOSIS — R41841 Cognitive communication deficit: Secondary | ICD-10-CM | POA: Diagnosis not present

## 2024-02-26 NOTE — Telephone Encounter (Signed)
 Copied from CRM [id number]. Topic: Clinical - Order For Equipment >> Feb 26, 2024 10:19 AM Rilla B wrote: Reason for CRM:  Regarding the order for portable oxygen  machine, [facility] will need signed order, face to face notes, clinical notes. Please call patient to confirm it's done.

## 2024-02-27 ENCOUNTER — Other Ambulatory Visit: Payer: Self-pay | Admitting: Sports Medicine

## 2024-02-27 ENCOUNTER — Other Ambulatory Visit: Payer: Self-pay | Admitting: Family

## 2024-02-27 NOTE — Telephone Encounter (Signed)
 High Risk Warning Populated when attempting to refill, I will send to Provider for further review

## 2024-02-28 DIAGNOSIS — R41841 Cognitive communication deficit: Secondary | ICD-10-CM | POA: Diagnosis not present

## 2024-02-29 ENCOUNTER — Other Ambulatory Visit: Payer: Self-pay | Admitting: Sports Medicine

## 2024-02-29 ENCOUNTER — Ambulatory Visit: Admitting: Neurology

## 2024-02-29 DIAGNOSIS — J45991 Cough variant asthma: Secondary | ICD-10-CM

## 2024-03-01 DIAGNOSIS — R41841 Cognitive communication deficit: Secondary | ICD-10-CM | POA: Diagnosis not present

## 2024-03-01 NOTE — Telephone Encounter (Signed)
 Per referral note  I spoke with [place] in [place] [employer] she states the patient wants to keep both POC and the E tanks. Insurance will not pay for both. Patient called [employer] on 01/11/24 and ask them to pick up her POC she wasn't paying for both of them   Pt no longer using POC.

## 2024-03-04 DIAGNOSIS — R41841 Cognitive communication deficit: Secondary | ICD-10-CM | POA: Diagnosis not present

## 2024-03-06 ENCOUNTER — Encounter (HOSPITAL_COMMUNITY): Payer: Self-pay

## 2024-03-06 DIAGNOSIS — R41841 Cognitive communication deficit: Secondary | ICD-10-CM | POA: Diagnosis not present

## 2024-03-06 NOTE — Telephone Encounter (Signed)
 Copied from CRM [id number]. Topic: Clinical - Order For Equipment >> Feb 26, 2024 10:19 AM Rilla B wrote: Reason for CRM:  Regarding the order for portable oxygen  machine, [facility] will need signed order, face to face notes, clinical notes. Please call patient to confirm it's done. >> Mar 05, 2024  3:35 PM Corean SAUNDERS wrote: Patients daughter Barabara is kindly requesting Dr. Geronimo to please order the patient POC to [facility] as soon as possible with all required information. Barabara states that the patient can't leave her room until she has this POC.    Please see updated concerns from patient's daughter. Please advise if we can get this ordered placed and all necessary docs on over to [facility].

## 2024-03-06 NOTE — Telephone Encounter (Signed)
 Spoke with the pt  She is asking for order for POC  She no longer wants to use [employer]  She wants order sent to [facility]  I advised will need ov to be qualified  Appt shcheduled for [facility] 10/8 at 1 pm

## 2024-03-07 ENCOUNTER — Encounter: Admitting: Nurse Practitioner

## 2024-03-07 DIAGNOSIS — M810 Age-related osteoporosis without current pathological fracture: Secondary | ICD-10-CM | POA: Insufficient documentation

## 2024-03-07 NOTE — Assessment & Plan Note (Signed)
 taking Donepezil .  Her mood is stable, on Fluoxetine , Wellbutrin . TSH 0.74 10/26/23

## 2024-03-07 NOTE — Assessment & Plan Note (Signed)
 on MiraLax , stable.

## 2024-03-07 NOTE — Progress Notes (Signed)
 This encounter was created in error - please disregard.

## 2024-03-07 NOTE — Assessment & Plan Note (Signed)
 Blood pressure is controlled, on midodrine , off Metoprolol 

## 2024-03-07 NOTE — Assessment & Plan Note (Signed)
 On Eliquis 

## 2024-03-07 NOTE — Assessment & Plan Note (Signed)
 s/p R BKA, Hgb 5.8 10/26/23

## 2024-03-07 NOTE — Assessment & Plan Note (Signed)
 Vit B12 1285 09/12/23, Iron 42 09/15/23, Hgb 10.1 10/26/23

## 2024-03-07 NOTE — Assessment & Plan Note (Signed)
Stable, on Pantoprazole 

## 2024-03-07 NOTE — Assessment & Plan Note (Signed)
 on Prolia , Ca, Vit D, DEXA 09/2022 wnl

## 2024-03-08 ENCOUNTER — Non-Acute Institutional Stay (INDEPENDENT_AMBULATORY_CARE_PROVIDER_SITE_OTHER): Admitting: Sports Medicine

## 2024-03-08 ENCOUNTER — Encounter: Payer: Self-pay | Admitting: Sports Medicine

## 2024-03-08 VITALS — BP 118/70 | HR 84 | Temp 97.4°F | Ht 65.0 in | Wt 146.4 lb

## 2024-03-08 DIAGNOSIS — R41841 Cognitive communication deficit: Secondary | ICD-10-CM | POA: Diagnosis not present

## 2024-03-08 DIAGNOSIS — D649 Anemia, unspecified: Secondary | ICD-10-CM

## 2024-03-08 DIAGNOSIS — F0280 Dementia in other diseases classified elsewhere without behavioral disturbance: Secondary | ICD-10-CM

## 2024-03-08 DIAGNOSIS — I959 Hypotension, unspecified: Secondary | ICD-10-CM | POA: Diagnosis not present

## 2024-03-08 DIAGNOSIS — G309 Alzheimer's disease, unspecified: Secondary | ICD-10-CM

## 2024-03-08 DIAGNOSIS — M159 Polyosteoarthritis, unspecified: Secondary | ICD-10-CM

## 2024-03-08 DIAGNOSIS — K219 Gastro-esophageal reflux disease without esophagitis: Secondary | ICD-10-CM | POA: Diagnosis not present

## 2024-03-08 DIAGNOSIS — J9611 Chronic respiratory failure with hypoxia: Secondary | ICD-10-CM | POA: Diagnosis not present

## 2024-03-08 NOTE — Patient Instructions (Addendum)
 Take tylenol  1000 mg three times daily  Take tramadol  1/2 tab as needed limit to 1-2 tab / day   Please come for lab work on Tuesday morning at 7.30

## 2024-03-08 NOTE — Progress Notes (Signed)
 Careteam: Patient Care Team: [name], MD as PCP - General (Internal Medicine) Darron Deatrice LABOR, MD as PCP - Cardiology (Cardiology) Bonner Ade, MD as Consulting Physician (Physical Medicine and Rehabilitation)  PLACE OF SERVICE:  [facility]  Advanced Directive information    Allergies  Allergen Reactions   Delsym [Dextromethorphan] Other (See Comments)    Cognitive impairment    Chief Complaint  Patient presents with   Medication Consultation    Patient is here for medication disccussion to confirm which meds are being taken.  Patient brought all medications to appointment  Patient hasn't had eye exam yet.      Discussed the use of AI scribe software for clinical note transcription with the patient, who gave verbal consent to proceed.  History of Present Illness    Shelly [name] is a 73 year old female who presents for medication management and follow-up.  She experiences chronic pain throughout her body, particularly in her lower back, neck, shoulders, and along her spine up to the bra level. She takes Tramadol , one in the morning and one in the evening, to manage the pain.    She has memory problems, which she finds distressing and has led to feelings of depression.  She has a history of blood clots and is currently on Eliquis .    She experiences nasal drainage and a persistent runny nose, which she attributes to allergies. She uses Singulair  for allergies and has Flonase  available for post-nasal drip. Her breathing is generally good, although deep breaths sometimes cause a catching sensation.  Her bowel movements have become more regular since last week, although she does not have daily bowel movements. She uses Miralax  as needed for constipation.  She has lost a significant amount of weight over time, from 185 pounds to approximately 146 pounds. She monitors her weight daily and reports it has been stable recently. Her appetite is good,  though she does not have a large appetite and tends to snack, particularly on chocolate.  She uses oxygen  therapy, with two liters during the day and three liters in her room. Occasionally, she forgets to switch her oxygen  supply but has not experienced adverse effects from these lapses.  She has a history of skin changes, including bruising. She also has a prosthesis, which sometimes makes noise when she walks, particularly in slippers.  Review of Systems:  Review of Systems  Constitutional:  Negative for chills and fever.  Respiratory:  Negative for cough, sputum production and shortness of breath.   Cardiovascular:  Negative for chest pain, palpitations and leg swelling.  Gastrointestinal:  Negative for abdominal pain, heartburn and nausea.  Genitourinary:  Negative for dysuria, frequency and hematuria.  Musculoskeletal:  Positive for back pain. Negative for falls.  Neurological:  Negative for dizziness.   Negative unless indicated in HPI.   Past Medical History:  Diagnosis Date   Anxiety    Arthritis    Asthma    Avascular necrosis of talus (HCC)    Cancer (HCC)    vulva pre cancer had surgery   Depression    Gait abnormality 03/20/2017   GERD (gastroesophageal reflux disease)    Hearing loss    very minor   History of pneumonia    Hyperlipidemia    Hypertension    Leg edema    left   Memory difficulty 03/20/2017   PONV (postoperative nausea and vomiting)    Stress incontinence    Past Surgical History:  Procedure Laterality Date  ANKLE FUSION  2011   right multiple    ANKLE FUSION     rear ankle fusion   ANKLE SURGERY  2010   right cordicompression   AORTIC ARCH ANGIOGRAPHY N/A 12/11/2017   Procedure: AORTIC ARCH ANGIOGRAPHY;  Surgeon: Harvey Carlin BRAVO, MD;  Location: [facility];  Service: Cardiovascular;  Laterality: N/A;   APPENDECTOMY  05/10/2016   BELOW KNEE LEG AMPUTATION     CATARACT EXTRACTION W/ INTRAOCULAR LENS IMPLANT Left     LAPAROSCOPIC APPENDECTOMY N/A 05/10/2016   Procedure: APPENDECTOMY LAPAROSCOPIC;  Surgeon: Vicenta Poli, MD;  Location: [facility] OR;  Service: General;  Laterality: N/A;   LUMBAR LAMINECTOMY/DECOMPRESSION MICRODISCECTOMY N/A 09/09/2015   Procedure:  L4-S1 Decompression/ Discetomy;  Surgeon: Donaciano Sprang, MD;  Location: [facility] OR;  Service: Orthopedics;  Laterality: N/A;   SKIN GRAFT     SPINAL CORD STIMULATOR BATTERY EXCHANGE  06/23/2023   SPINAL CORD STIMULATOR INSERTION N/A 01/10/2019   Procedure: LUMBAR SPINAL CORD STIMULATOR INSERTION;  Surgeon: Sprang Donaciano, MD;  Location: [facility] OR;  Service: Orthopedics;  Laterality: N/A;  2.5 hrs   TONSILLECTOMY     TUBAL LIGATION  1983   VULVECTOMY N/A 01/28/2015   Procedure: WIDE EXCISION VULVECTOMY;  Surgeon: Hargis Paradise, MD;  Location: [facility];  Service: Gynecology;  Laterality: N/A;   Social History:   reports that she quit smoking about 17 years ago. Her smoking use included cigarettes. She started smoking about 57 years ago. She has a 40 pack-year smoking history. She has never used smokeless tobacco. She reports current alcohol  use. She reports that she does not use drugs.  Family History  Problem Relation Age of Onset   Dementia Mother 75       alive   Fibromyalgia Mother    Allergies Mother    Heart attack Father 69       deceased   Multiple myeloma Father    Heart murmur Sister 104       she had open heart surgery   Other Sister 43       She is bIpolar- diabetic   Breast cancer Sister    Other Brother        alive   Heart disease Brother        emergent CABG for 99% blocked CAD    Medications: Patient's Medications  New Prescriptions   No medications on file  Previous Medications   ACETAMINOPHEN  (TYLENOL ) 325 MG TABLET    Take 650 mg by mouth as needed.   ACYCLOVIR CREAM (ZOVIRAX) 5 %    Apply 1 Application topically 2 (two) times daily as needed (cold sore).   ALBUTEROL  (PROAIR  HFA) 108 (90 BASE) MCG/ACT INHALER    2 puffs  every 4 hours as needed only  if your can't catch your breath   BENZONATATE  (TESSALON ) 200 MG CAPSULE    Take 1 capsule (200 mg total) by mouth 3 (three) times daily as needed for cough.   BUPROPION  (WELLBUTRIN  XL) 300 MG 24 HR TABLET    Take 1 tablet (300 mg total) by mouth daily.   CALCIUM  CARBONATE (OSCAL) 1500 (600 CA) MG TABS TABLET    Take 1 tablet (1,500 mg total) by mouth at bedtime.   DENOSUMAB  (PROLIA ) 60 MG/ML SOLN INJECTION    Inject 60 mg into the skin every 6 (six) months.    DONEPEZIL  (ARICEPT ) 10 MG TABLET    Take 1 tablet (10 mg total) by mouth at bedtime.   ELIQUIS   5 MG TABS TABLET    TAKE ONE TABLET BY MOUTH TWICE DAILY   FLUOXETINE  (PROZAC ) 20 MG CAPSULE    TAKE ONE CAPSULE BY MOUTH ONCE DAILY   FLUTICASONE  (FLONASE ) 50 MCG/ACT NASAL SPRAY    Place 2 sprays into both nostrils daily.   HYDROCORTISONE  2.5 % CREAM    Apply topically 2 (two) times daily.   IPRATROPIUM-ALBUTEROL  (DUONEB) 0.5-2.5 (3) MG/3ML SOLN    Take 3 mLs by nebulization every 6 (six) hours as needed (Shortness of breath, cough and/or wheeze). Use this or albuterol  inhaler but not both.   MELATONIN 3 MG TABS TABLET    Take 3 mg by mouth at bedtime.   MIDODRINE  (PROAMATINE ) 5 MG TABLET    Take 1 tablet (5 mg total) by mouth daily.   MONTELUKAST  (SINGULAIR ) 10 MG TABLET    TAKE ONE TABLET BY MOUTH AT BEDTIME   MULTIPLE VITAMINS-MINERALS (CENTRUM SILVER WOMEN 50+) TABS    Take 1 tablet by mouth daily.   NYSTATIN CREAM (MYCOSTATIN)    Apply 1 Application topically.   OXYGEN     Inhale into the lungs. O2 2L, Monitor O2 sat Qshift to maintain O2 Sat >95% every shift   PANTOPRAZOLE  (PROTONIX ) 40 MG TABLET    TAKE 30 TO 60 MINUTES BEFORE THE FIRST AND LAST MEAL OF THE DAY   POTASSIUM 99 MG TABS    Take 1 tablet by mouth daily.   ROSUVASTATIN  (CRESTOR ) 10 MG TABLET    Take 1 tablet (10 mg total) by mouth every evening.   TRAMADOL  (ULTRAM ) 50 MG TABLET    Take 1 tablet (50 mg total) by mouth daily as needed.   TURMERIC  CURCUMIN 500 MG CAPS    1000 mg 2 tablets Orally once a day  Modified Medications   No medications on file  Discontinued Medications   BUDESONIDE -FORMOTEROL  (SYMBICORT ) 160-4.5 MCG/ACT INHALER    Inhale 2 puffs into the lungs in the morning and at bedtime.   ONDANSETRON  (ZOFRAN -ODT) 8 MG DISINTEGRATING TABLET    Take 8 mg by mouth every 8 (eight) hours as needed for nausea or vomiting.   POLYETHYLENE GLYCOL (MIRALAX  / GLYCOLAX ) 17 G PACKET    Take 17 g by mouth as needed. 1 scoop every 24 hours as needed for constipation   TURMERIC CURCUMIN PO    Take 1,500 mg by mouth daily.   VALACYCLOVIR (VALTREX) 1000 MG TABLET    Take 1,000 mg by mouth as needed. Continue until cold sores resolve.   ZINC  OXIDE (TRIPLE PASTE) 12.8 % OINTMENT    Apply 1 Application topically as needed for irritation.    Physical Exam: Vitals:   03/08/24 0852  BP: 118/70  Pulse: 84  Temp: (!) 97.4 F (36.3 C)  SpO2: 97%  Weight: 146 lb 6.4 oz (66.4 kg)  Height: 5' 5 (1.651 m)   Body mass index is 24.36 kg/m. BP Readings from Last 3 Encounters:  03/08/24 118/70  02/09/24 124/80  12/29/23 106/60   Wt Readings from Last 3 Encounters:  03/08/24 146 lb 6.4 oz (66.4 kg)  02/09/24 151 lb (68.5 kg)  12/29/23 154 lb (69.9 kg)    Physical Exam Constitutional:      Appearance: Normal appearance.  HENT:     Head: Normocephalic and atraumatic.  Cardiovascular:     Rate and Rhythm: Normal rate and regular rhythm.  Pulmonary:     Effort: Pulmonary effort is normal. No respiratory distress.     Breath sounds: Normal  breath sounds. No wheezing.  Abdominal:     General: Bowel sounds are normal. There is no distension.     Tenderness: There is no abdominal tenderness. There is no guarding or rebound.     Comments:    Musculoskeletal:        General: No swelling.  Neurological:     Mental Status: She is alert. Mental status is at baseline.     Motor: No weakness.     Labs reviewed: Basic Metabolic  Panel: Recent Labs    08/30/23 1745 08/31/23 0801 09/03/23 0242 09/04/23 0357 09/05/23 0330 09/06/23 0404 09/11/23 0541 09/23/23 9561 09/25/23 0436 09/28/23 0450 10/10/23 0000 10/26/23 0000  NA  --    < >  --  138 140 139   < > 136 137 135 140 140  K  --    < >  --  3.2* 3.2* 3.7   < > 4.0 4.2 3.9 4.3 4.3  CL  --    < >  --  101 104 105   < > 96* 99 96* 103 103  CO2  --    < >  --  26 27 22    < > 29 32 28 31* 31*  GLUCOSE  --    < >  --  150* 117* 95   < > 84 81 92  --   --   BUN  --    < >  --  12 15 16    < > 16 14 20  33* 15  CREATININE 1.18*   < >  --  0.68 0.66 0.73   < > 0.57 0.62 0.69 0.5 0.6  CALCIUM   --    < >  --  7.6* 7.8* 7.9*   < > 8.5* 8.9 8.9 8.3* 8.7  MG  --    < > 1.8 2.0 2.2 1.8  --   --  2.0 1.9  --   --   PHOS  --   --  2.7 2.9 2.3*  --   --   --   --   --   --   --   TSH 1.153  --   --   --   --   --   --   --   --   --   --  0.74   < > = values in this interval not displayed.   Liver Function Tests: Recent Labs    09/03/23 0200 09/05/23 0330 09/06/23 0404 09/11/23 0541 10/26/23 0000  AST 53*  --  42* 29 19  ALT 46*  --  63* 35 15  ALKPHOS 64  --  57 71  --   BILITOT 1.2  --  0.7 0.9  --   PROT 5.9*  --  5.4* 6.0*  --   ALBUMIN  2.5*   < > 2.2* 2.4* 3.2*   < > = values in this interval not displayed.   Recent Labs    04/01/23 1847  LIPASE 14   No results for input(s): AMMONIA in the last 8760 hours. CBC: Recent Labs    09/23/23 0438 09/25/23 0436 09/28/23 0450 10/10/23 0000 10/17/23 0000 10/26/23 0000  WBC 12.9* 13.2* 16.1* 8.9 6.6 6.0  NEUTROABS  --  9.3* 12.6*  --  3,478.00 3,324.00  HGB 9.6* 10.1* 11.0* 10.0* 10.0* 10.1*  HCT 29.7* 31.4* 33.4* 31* 32* 33*  MCV 96.4 96.3 95.4  --   --   --   PLT  310 314 322 234 276 327   Lipid Panel: Recent Labs    10/26/23 0000  CHOL 139  HDL 56  LDLCALC 69  TRIG 62   TSH: Recent Labs    08/30/23 1745 10/26/23 0000  TSH 1.153 0.74   A1C: Lab Results  Component Value Date    HGBA1C 5.8 10/26/2023    Assessment and Plan Assessment & Plan   1. Hypotension, unspecified hypotension type (Primary) Bp at goal  Cont with midodrine    2. Chronic respiratory failure with hypoxia (HCC) ILD  PE On supplemental o2 Cont with eliquis   3. Gastroesophageal reflux disease, unspecified whether esophagitis present Stable  Cont with protonix   4. Alzheimer's dementia, unspecified dementia severity, unspecified timing of dementia onset, unspecified whether behavioral, psychotic, or mood disturbance or anxiety (HCC) Increase cognitively engaging activities and physical activity  Cont with aricept   5. Generalized OA Take  tylenol  1000mg  tid  Use tramadol  1/2 tab  bid prn  6. Anemia, unspecified type No signs of bleeding - CBC (no diff)

## 2024-03-11 ENCOUNTER — Ambulatory Visit: Payer: Self-pay | Admitting: Neurology

## 2024-03-11 DIAGNOSIS — R41841 Cognitive communication deficit: Secondary | ICD-10-CM | POA: Diagnosis not present

## 2024-03-12 ENCOUNTER — Ambulatory Visit: Admitting: Neurology

## 2024-03-12 ENCOUNTER — Encounter: Payer: Self-pay | Admitting: Neurology

## 2024-03-12 VITALS — BP 105/59 | HR 66 | Ht 65.0 in | Wt 151.5 lb

## 2024-03-12 DIAGNOSIS — G309 Alzheimer's disease, unspecified: Secondary | ICD-10-CM

## 2024-03-12 DIAGNOSIS — F419 Anxiety disorder, unspecified: Secondary | ICD-10-CM | POA: Diagnosis not present

## 2024-03-12 DIAGNOSIS — F0280 Dementia in other diseases classified elsewhere without behavioral disturbance: Secondary | ICD-10-CM | POA: Diagnosis not present

## 2024-03-12 DIAGNOSIS — F32A Depression, unspecified: Secondary | ICD-10-CM

## 2024-03-12 MED ORDER — MEMANTINE HCL 10 MG PO TABS: 10.0000 mg | ORAL_TABLET | Freq: Two times a day (BID) | ORAL | 5 refills | Status: AC

## 2024-03-12 NOTE — Progress Notes (Signed)
 Patient: Shelly [name] Date of Birth: December 24, 1950  Reason for Visit: Follow up for memory History from: Patient, daughter Primary Neurologist: Dr. Gregg  ASSESSMENT AND PLAN 73 y.o. year old female   1.  Mild cognitive impairment 2.  Anxiety, mood disorder  Since last seen, had significant health issue with PNA, respiratory failure.  Spent several months in SNF, now living at [facility]. MOCA 23/30, stable from 22/30 in March 2025. On Aricept  10 mg daily. Also now on Eliquis  for PE. A+T+N- E3/E4. Neuropsychological testing Feb 2025 with Dr. Authur was consistent with mild AD vs in 2023 felt memory issues were more multifactorial due to fractionated sleep patterns, chronic pain, psychological factors, anxiety.  - Start Namenda 10 mg twice a day - Continue Aricept  10 mg daily - I called her daughter, we discussed antiamyloid medications, Leqembi and Kisunla, currently not interested with recent health issues  - Recommend brain stimulating exercises, consistent physical activity; good management vascular risk factors -Follow-up 6 months or sooner if needed  HISTORY OF PRESENT ILLNESS: Today 03/12/24 Update 03/12/24 SS: A+T+N-, E3/E4. MOCA 23/30. She is living at [facility]. Significant illness with PNA in April, spent several months in SNF. Is now on oxygen . Claims short term memory is doing fine, long term gives her an issue. Is no longer driving. Remains on Aricept  10 mg daily. She manages her medications, does her ADLs. Uses rolling walker.   08/24/23 SS: Received neuropsychological evaluation from 07/20/2023 from Dr. Jenna Renfroe.  Compared to previous evaluation she has shown significant improvement in mood, anxiety, sleep quality.  Her objective memory impairment has worsened.  Clinically she now meets criteria for dementia due to possible Alzheimer's disease, mild.   She had a mechanical fall in January 2025, needed stitches for  laceration above right eye .  CT head showed mild chronic ischemic change.continues with right shoulder pain, seeing [facility]. 2 days before fall had battery replaced for spinal stimulator. 3 falls in 2 week period, 1 time fell back on the toilet and it broke. Daughter sent UTI test, was positive, took it 4 hours to complete. Got antibiotics. Dizziness resolved.   Here with her daughter who is a PA in [place]. MOCA 22/30 today. Lives alone. All her kids live out of town. [facility] are [facility] living. 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 Managing her medications, affairs. Makes list of medications, does her box this way. Drives, but not at night, no accidents, has GPS is needed. They did gets lost on the way here. Sleeping well, lays in bed at 8 PM to red, gets up to use the bathroom, sleeps well other than right arm pain. Her mother had AD, never officially diagnosed. Can't afford hearing aids. Sleep study in the past was inconclusive. PCP was going to order. Has had anxiety, obviously worries about memory. Tries to do everything right, gets frustrated when she doesn't. Lost about 20 lbs in the last 15 months, was taking OTC weight loss pills. On protein regimen.  Regena been told spinal cord stimulator with new battery, is now MRI compatible.    Neuropsychological evaluation from Dr. Authur 01/10/22, consistent with unspecified mild cognitive impairment that is likely multifactorial.  Neurodegenerative process cannot fully be ruled out, but there is likely contribution from recent fractionated sleep patterns, her chronic pain, generalized anxiety, mood issues.  Could also be a vascular component given evidence of executive dysfunction  on testing and presence of vascular risk factors and her medical history. Recommended repeat neuropsychological testing in 12 to 18 months.   Recommendations -Hearing evaluation with audiologist -Better sleep  habits -Sleep apnea, OSA? -Recommended psychotherapy -Staying active, brain healthy diet  Update 02/07/23 SS: PCP sent back for worsening memory. Patient laughs she is losing her mind. MOCA 21/30. Having gaps in her memory, can't remember ingredients for well know recipe, what she was going into room for. Taking few supplements for memory (not sure what), forgot for 2 weeks, thought she was going into dementia. Her mother had dementia, she is afraid of being like her. She is living alone, driving, doing fine with this, she gave her dog away had to walk 3 times a day, was too hot. 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 Hard time remembering peoples names. She is better now than she was 1 month ago, was very anxious at the time, due to worry for dementia. Has phobias, worries about getting fat. Sleeping better, in bed around 7:30 PM, reading for several hours. 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 Summarizes her summer as overwhelming .  Update 05/19/22 SS: Doing well, MMSE 24/30 today. Did 6 weeks of counseling, was feeling overwhelmed, now feeling much better. Doesn't think she snores, no dry mouth, or apnea, no morning headache. Sleep is better, taking Nervive, helps with achy pains. Goes to bed 9 PM, 4 AM to go to work. She is a Social worker for 75 year old. After having neuropsychological evaluation, she felt reassured, feels memory issues are better. Working on anxiety. Had audiology evaluation, told needed hearing aids, too expensive. Has lost 12 lbs in the last few weeks, has changed her diet. Seeing pulmonary for lung issues.  Lives alone, drives a car.  Received neuropsychological evaluation from Dr. Authur 01/10/22, consistent with unspecified mild cognitive impairment that is likely multifactorial.  Neurodegenerative process cannot fully be ruled out, but there is likely contribution from recent fractionated sleep patterns, her chronic pain, generalized anxiety,  mood issues.  Could also be a vascular component given evidence of executive dysfunction on testing and presence of vascular risk factors and her medical history. Recommended repeat neuropsychological testing in 12 to 18 months.   Recommendations -Hearing evaluation with audiologist -Better sleep habits -Sleep apnea, OSA? -Recommended psychotherapy -Staying active, brain healthy diet  09/28/21 SS: Ms. Panjwani here today for follow-up. MOCA 23/30. Was 25/30 at last visit. Thinks memory is declining. Her mother had dementia, she is paranoid this is happening to her. Lives alone, does everything independently. Worries her, goes to room can't remember what she was going to do. Drives a car well, but is getting concerned about night driving, judging distances. 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 Keeps lists on her phone for groceries. Pays her own bills. CT brain Feb 2022 showed age related changes, can't have any MRI due to spinal stimulator. Had cognitive training for speech therapy. Was helpful and gave her confidence. A lot of involvement in church. Does have anxiety is on Prozac , Wellbutrin .   HISTORY  03/02/2021 Dr. Jenel: Ms. Hand is a 73 year old right-handed white female with a history of a mild memory disorder.  The patient has not given up any activities of daily living because of this.  She at times will have some difficulty remembering directions to areas that she has been before.  She manages her medications, appointments, and finances quite  well.  She reports a short-term memory issue, sometimes she will walk into a room and cannot remember why she is there.  She may have some word finding difficulty, and difficulty remembering names for things.  The patient usually sleeps fairly well.  She does have some spine pain, she has a spinal stimulator in place.  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 Harden for  this.  She has had multiple falls recently, most of them have been related to prosthetic failure or tripping over something.  She returns to the office today for further evaluation.  REVIEW OF SYSTEMS: Out of a complete 14 system review of symptoms, the patient complains only of the following symptoms, and all other reviewed systems are negative.  See HPI  ALLERGIES: Allergies  Allergen Reactions   Delsym [Dextromethorphan] Other (See Comments)    Cognitive impairment    HOME MEDICATIONS: Outpatient Medications Prior to Visit  Medication Sig Dispense Refill   acetaminophen  (TYLENOL ) 325 MG tablet Take 650 mg by mouth as needed.     acyclovir cream (ZOVIRAX) 5 % Apply 1 Application topically 2 (two) times daily as needed (cold sore).     albuterol  (PROAIR  HFA) 108 (90 Base) MCG/ACT inhaler 2 puffs every 4 hours as needed only  if your can't catch your breath 18 g 0   benzonatate  (TESSALON ) 200 MG capsule Take 1 capsule (200 mg total) by mouth 3 (three) times daily as needed for cough. 30 capsule 0   buPROPion  (WELLBUTRIN  XL) 300 MG 24 hr tablet Take 1 tablet (300 mg total) by mouth daily. 90 tablet 0   calcium  carbonate (OSCAL) 1500 (600 Ca) MG TABS tablet Take 1 tablet (1,500 mg total) by mouth at bedtime. 30 tablet 0   denosumab  (PROLIA ) 60 MG/ML SOLN injection Inject 60 mg into the skin every 6 (six) months.      donepezil  (ARICEPT ) 10 MG tablet Take 1 tablet (10 mg total) by mouth at bedtime. 90 tablet 1   ELIQUIS  5 MG TABS tablet TAKE ONE TABLET BY MOUTH TWICE DAILY 60 tablet 0   FLUoxetine  (PROZAC ) 20 MG capsule TAKE ONE CAPSULE BY MOUTH ONCE DAILY 30 capsule 0   fluticasone  (FLONASE ) 50 MCG/ACT nasal spray Place 2 sprays into both nostrils daily. 16 g 6   hydrocortisone  2.5 % cream Apply topically 2 (two) times daily. 28 g 3   ipratropium-albuterol  (DUONEB) 0.5-2.5 (3) MG/3ML SOLN Take 3 mLs by nebulization every 6 (six) hours as needed (Shortness of breath, cough and/or wheeze).  Use this or albuterol  inhaler but not both. 360 mL 1   melatonin 3 MG TABS tablet Take 3 mg by mouth at bedtime.     midodrine  (PROAMATINE ) 5 MG tablet Take 1 tablet (5 mg total) by mouth daily. 30 tablet 0   montelukast  (SINGULAIR ) 10 MG tablet TAKE ONE TABLET BY MOUTH AT BEDTIME 30 tablet 0   Multiple Vitamins-Minerals (CENTRUM SILVER WOMEN 50+) TABS Take 1 tablet by mouth daily.     nystatin cream (MYCOSTATIN) Apply 1 Application topically.     OXYGEN  Inhale into the lungs. O2 2L, Monitor O2 sat Qshift to maintain O2 Sat >95% every shift     pantoprazole  (PROTONIX ) 40 MG tablet TAKE 30 TO 60 MINUTES BEFORE THE FIRST AND LAST MEAL OF THE DAY 180 tablet 1   Potassium 99 MG TABS Take 1 tablet by mouth daily.  rosuvastatin  (CRESTOR ) 10 MG tablet Take 1 tablet (10 mg total) by mouth every evening. 90 tablet 1   traMADol  (ULTRAM ) 50 MG tablet Take 1 tablet (50 mg total) by mouth daily as needed.     Turmeric Curcumin 500 MG CAPS 1000 mg 2 tablets Orally once a day     No facility-administered medications prior to visit.    PAST MEDICAL HISTORY: Past Medical History:  Diagnosis Date   Anxiety    Arthritis    Asthma    Avascular necrosis of talus (HCC)    Cancer (HCC)    vulva pre cancer had surgery   Depression    Gait abnormality 03/20/2017   GERD (gastroesophageal reflux disease)    Hearing loss    very minor   History of pneumonia    Hyperlipidemia    Hypertension    Leg edema    left   Memory difficulty 03/20/2017   PONV (postoperative nausea and vomiting)    Stress incontinence     PAST SURGICAL HISTORY: Past Surgical History:  Procedure Laterality Date   ANKLE FUSION  2011   right multiple    ANKLE FUSION     rear ankle fusion   ANKLE SURGERY  2010   right cordicompression   AORTIC ARCH ANGIOGRAPHY N/A 12/11/2017   Procedure: AORTIC ARCH ANGIOGRAPHY;  Surgeon: Harvey Carlin BRAVO, MD;  Location: [facility];  Service: Cardiovascular;  Laterality: N/A;    APPENDECTOMY  05/10/2016   BELOW KNEE LEG AMPUTATION     CATARACT EXTRACTION W/ INTRAOCULAR LENS IMPLANT Left    LAPAROSCOPIC APPENDECTOMY N/A 05/10/2016   Procedure: APPENDECTOMY LAPAROSCOPIC;  Surgeon: Vicenta Poli, MD;  Location: [facility] OR;  Service: General;  Laterality: N/A;   LUMBAR LAMINECTOMY/DECOMPRESSION MICRODISCECTOMY N/A 09/09/2015   Procedure:  L4-S1 Decompression/ Discetomy;  Surgeon: Donaciano Sprang, MD;  Location: [facility] OR;  Service: Orthopedics;  Laterality: N/A;   SKIN GRAFT     SPINAL CORD STIMULATOR BATTERY EXCHANGE  06/23/2023   SPINAL CORD STIMULATOR INSERTION N/A 01/10/2019   Procedure: LUMBAR SPINAL CORD STIMULATOR INSERTION;  Surgeon: Sprang Donaciano, MD;  Location: [facility] OR;  Service: Orthopedics;  Laterality: N/A;  2.5 hrs   TONSILLECTOMY     TUBAL LIGATION  1983   VULVECTOMY N/A 01/28/2015   Procedure: WIDE EXCISION VULVECTOMY;  Surgeon: Hargis Paradise, MD;  Location: [facility];  Service: Gynecology;  Laterality: N/A;    FAMILY HISTORY: Family History  Problem Relation Age of Onset   Dementia Mother 29       alive   Fibromyalgia Mother    Allergies Mother    Heart attack Father 80       deceased   Multiple myeloma Father    Heart murmur Sister 71       she had open heart surgery   Other Sister 65       She is bIpolar- diabetic   Breast cancer Sister    Other Brother        alive   Heart disease Brother        emergent CABG for 99% blocked CAD    SOCIAL HISTORY: Social History   Socioeconomic History   Marital status: Divorced    Spouse name: Not on file   Number of children: 3   Years of education: 14   Highest education level: Not on file  Occupational History   Occupation: ACCOUNTING    Employer: [employer]  Tobacco Use   Smoking status: Former  Current packs/day: 0.00    Average packs/day: 1 pack/day for 40.0 years (40.0 ttl pk-yrs)    Types: Cigarettes    Start date: 02/05/1967    Quit date: 02/05/2007    Years since quitting: 17.1    Smokeless tobacco: Never  Vaping Use   Vaping status: Never Used  Substance and Sexual Activity   Alcohol  use: Yes    Alcohol /week: 0.0 standard drinks of alcohol     Comment: socially   Drug use: No   Sexual activity: Not on file  Other Topics Concern   Not on file  Social History Narrative   Lives alone   Caffeine use: Tea every morning    Right handed    Social Drivers of Health   Financial Resource Strain: Not on file  Food Insecurity: No Food Insecurity (09/11/2023)   Hunger Vital Sign    Worried About Running Out of Food in the Last Year: Never true    Ran Out of Food in the Last Year: Never true  Transportation Needs: No Transportation Needs (09/11/2023)   PRAPARE - Administrator, Civil Service (Medical): No    Lack of Transportation (Non-Medical): No  Physical Activity: Not on file  Stress: Not on file  Social Connections: Moderately Isolated (09/11/2023)   Social Connection and Isolation Panel    Frequency of Communication with Friends and Family: More than three times a week    Frequency of Social Gatherings with Friends and Family: Once a week    Attends Religious Services: Never    Database administrator or Organizations: No    Attends Banker Meetings: Never    Marital Status: Married  Catering manager Violence: Not At Risk (09/11/2023)   Humiliation, Afraid, Rape, and Kick questionnaire    Fear of Current or Ex-Partner: No    Emotionally Abused: No    Physically Abused: No    Sexually Abused: No   PHYSICAL EXAM  Vitals:   03/12/24 1252  BP: (!) 105/59  Pulse: 66  Weight: 151 lb 8 oz (68.7 kg)  Height: 5' 5 (1.651 m)     Body mass index is 25.21 kg/m.    03/12/2024   12:55 PM 08/24/2023    8:53 AM 02/07/2023    7:54 AM 09/28/2021    1:59 PM 03/02/2021    7:42 AM  Montreal Cognitive Assessment   Visuospatial/ Executive (0/5) 3 4 4 4 4   Naming (0/3) 3 3 2 3 3   Attention: Read list of digits (0/2) 2 2 1 2 2   Attention: Read list  of letters (0/1) 1 0 1 1 1   Attention: Serial 7 subtraction starting at 100 (0/3) 3 2 3 3 3   Language: Repeat phrase (0/2) 2 2 1 1 2   Language : Fluency (0/1) 1 1 1 1 1   Abstraction (0/2) 2 2 2 1 2   Delayed Recall (0/5) 0 0 0 1 1  Orientation (0/6) 6 6 6 6 6   Total 23 22 21 23 25   Adjusted Score (based on education)  22         05/19/2022   12:44 PM 09/18/2017   12:07 PM  MMSE - Mini Mental State Exam  Orientation to time 4 5  Orientation to Place 5 5  Registration 3 3  Attention/ Calculation 1 5  Recall 2 2  Language- name 2 objects 2 2  Language- repeat 1 1  Language- follow 3 step command 3 3  Language- read &  follow direction 1 1  Write a sentence 1 1  Copy design 1 1  Total score 24 29   Generalized: Well developed, in no acute distress, wearing nasal cannula oxygen  Neurological examination  Mentation: Alert oriented to time, place, history taking. Follows all commands speech and language fluent Cranial nerve II-XII: Pupils were equal round reactive to light. Extraocular movements were full, visual field were full on confrontational test. Facial sensation and strength were normal.  Head turning and shoulder shrug  were normal and symmetric. Motor: right BKA wearing prosthesis.  Sensory: Sensory testing is intact to soft touch on all 4 extremities. No evidence of extinction is noted.  Coordination: Cerebellar testing reveals good finger-nose-finger bilaterally Gait and station: Uses rolling walker   DIAGNOSTIC DATA (LABS, IMAGING, TESTING) - I reviewed patient records, labs, notes, testing and imaging myself where available.  Lab Results  Component Value Date   WBC 6.0 10/26/2023   HGB 10.1 (A) 10/26/2023   HCT 33 (A) 10/26/2023   MCV 95.4 09/28/2023   PLT 327 10/26/2023      Component Value Date/Time   NA 140 10/26/2023 0000   K 4.3 10/26/2023 0000   CL 103 10/26/2023 0000   CO2 31 (A) 10/26/2023 0000   GLUCOSE 92 09/28/2023 0450   BUN 15 10/26/2023 0000    CREATININE 0.6 10/26/2023 0000   CREATININE 0.69 09/28/2023 0450   CREATININE 0.83 02/12/2015 0847   CALCIUM  8.7 10/26/2023 0000   PROT 6.0 (L) 09/11/2023 0541   ALBUMIN  3.2 (A) 10/26/2023 0000   AST 19 10/26/2023 0000   ALT 15 10/26/2023 0000   ALKPHOS 71 09/11/2023 0541   BILITOT 0.9 09/11/2023 0541   GFRNONAA >60 09/28/2023 0450   GFRAA >60 01/11/2019 1432   Lab Results  Component Value Date   CHOL 139 10/26/2023   HDL 56 10/26/2023   LDLCALC 69 10/26/2023   TRIG 62 10/26/2023   Lab Results  Component Value Date   HGBA1C 5.8 10/26/2023   Lab Results  Component Value Date   VITAMINB12 1,285 (H) 09/12/2023   Lab Results  Component Value Date   TSH 0.74 10/26/2023   Lauraine Born, AGNP-C, DNP 03/12/2024, 1:49 PM [facility] [address] [place], [place] [address] [phone]

## 2024-03-12 NOTE — Patient Instructions (Addendum)
 Start Namenda 10 mg twice daily for memory, start taking 1/2 tablet twice daily.  Watch for dizziness  Continue Aricept  for memory  I will contact your daughter Follow up in 6 months   Patient Information about Leqembi and Kisunla (taken from clinical drug websites)  Leqembi(Lecanemab) and Kisunla(Donanemab) are both indicated for the treatment of Alzheimer's disease and are monoclonal antibodies directed against amyloid beta plaques.  There are no head to head trials comparing both medications. The trials measured different endpoints.   Removal of plaque appears strong in both; while slightly different mechanisms of action both clear plaques and appear to have an effect on other biomarkers.  Ongoing data analysis supports that anti-amyloid therapies provide modest clinical benefit.  There is no head-to-head data to compare these two therapies any comparisons are not definitive especially with effectiveness given different trial designs, different populations.  Leqembi targets protofibrils (small chains of linked up beta-amyloid proteins), preventing them to form, thus preventing amyloid plaques from developing. Kisunla targets beta amyloid plaques.   Leqembi   Leqembi looked at CDR (Clinical Dementia Rating) which, is the gold standard for AD staging and progression. Leqembi slowed progression 0-18 months by 27%. 95% of patients chose to extend their treatment out to 3 years. Of patients taking Leqembi 16% were APOE E4 homozygotes (E4/E4), 53% were heterozygotes (E3/E4), 31% noncarriers.   With Leqembi, ARIA was higher in ApoE ?4 homozygotes (Leqembi: 45%; placebo: 22%) than in heterozygotes (LEQEMBI: 19%; placebo: 9%) and noncarriers (Leqembi: 13%; placebo: 4%). Symptomatic ARIA-E occurred in 9% of ApoE ?4 homozygotes vs 2% of heterozygotes and 1% of noncarriers. Serious ARIA events occurred in 3% of ApoE ?4 homozygotes and in ~1% of heterozygotes and noncarriers. The recommendations on  management of ARIA do not differ between ApoE ?4 carriers and noncarriers.   The most common adverse reactions reported in >=5% with Leqembi and >=2% higher than placebo were IRRs (Leqembi: 26%; placebo: 7%), ARIA-H (Leqembi: 14%; placebo: 8%), ARIA-E (Leqembi: 13%; placebo: 2%), headache (Leqembi: 11%; placebo: 8%), superficial siderosis of central nervous system (Leqembi: 6%; placebo: 3%), rash (Leqembi: 6%; placebo: 4%), and nausea/vomiting (Leqembi: 6%; placebo: 4%).  Kisunla  Kisunla measured iADRS. 37% reduced risk of progressing to the next stage of disease vs placebo through 76 weeks   The risk of ARIA, including symptomatic and serious ARIA, is increased in apolipoprotein E ?4 (ApoE ?4) homozygotes. 17% (143/850) of patients in the Kisunla arm were ApoE ?4 homozygotes, 53% (452/850) were heterozygotes, and 30% (255/850) were noncarriers. The incidence of ARIA was higher in ApoE ?4 homozygotes (Kisunla: 55%; placebo: 22%) than in heterozygotes (Kisunla: 36%; placebo: 13%) and noncarriers (Kisunla: 25%; placebo: 12%). Among patients treated with Kisunla, symptomatic ARIA-E occurred in 8% of ApoE ?4 homozygotes compared with 7% of heterozygotes and 4% of noncarriers. Serious events of ARIA occurred in 3% of ApoE ?4 homozygotes, 2% of heterozygotes, and 1% of noncarriers.  Celedonio demonstrated reduction in amyloid plaques as early as 6 months. 61% average amyloid plaque reduction at 6 months, average 80% amyloid plaque reduction at 12 months, average 84% amyloid plaque reduction at 18 months. (Amyloid Plaque Reduction from Baseline in Overall Population in TRAILBLAZER-ALZ 2)  After 18 months in a clinical study, people treated with Celedonio showed a significant slowing of decline of 22%, on average, compared with placebo.  Adverse Reactions: The most common adverse reactions reported in >=5% of patients treated with Kisunla (n=853) and >=2% higher than placebo (n=874): ARIA-H microhemorrhage (25%  vs  11%), ARIA-E (24% vs 2%), ARIA-H superficial siderosis (15% vs 3%), headache (13% vs 10%), IRRs (9% vs 0.5%).  Monitoring for ARIA Obtain a recent baseline brain magnetic resonance imaging (MRI) prior to initiating treatment with KISUNLA. Obtain an MRI prior to the 2nd, 3rd, 4th, and 7th infusions. If a patient experiences symptoms suggestive of ARIA, clinical evaluation should be performed, including an MRI if indicated. It's possible to stop taking Kisunla as early as 6 months Your doctor may consider stopping your Celedonio treatment if your amyloid plaques are reduced to a minimal level In a clinical study, 17% of people were able to stop taking Kisunla at 6 months, 47% at 12 months, and 69% at 18 months if their amyloid plaques were reduced to a predefined level. PET scans were used to determine amyloid levels in the study. People who were able to stop treatment were switched to placebo Key differences between the medications:  Leqembi: - Every 2 weeks, new maintenance dosing after 18 months monthly  - Is weight based  - Dosing is indefinite - Memory testing score 22-30 - The studies did not repeat CT amyloid PET - Primary endpoints were different, primary endpoint of this medication was the Clinical Dementia Rating Scale at 18 months which showed decrease of 27% progression compared to placebo - Different mechanism, stops plaques from forming, beginning to form and prevents further aggregation, may be more effective in earlier stages of disease - Increased risk of ARIA for homozygote E4   Kisunla:  - Monthly - Set dose regardless of weight - Memory score testing 18-28 (includes lower memory score than Leqembi) - Patients may be able to stop infusions as studies did show overall a high percentage met criteria to stop at 12 months visually by PET/CT.  However no guidelines on how soon toxic species that are unmeasured bounce back after cessation of treatment and what are the markers of  disease/treatment monitoring  - Primary endpoint was Integrated Alzheimer's Disease rating scale at 18 months which is more comprehensive as it test both cognitive and functional aspects of Alzheimer's disease and showed approximately 35% decrease in cognitive decline as compared to placebo - Different mechanism interrupts plaques/aggregated more mature form of plaques so may be more beneficial in later stages of disease - Significantly increased risk of ARIA as compared to Leqembi especially for E4 homozygotes.   - Given their increased risk of ARIA there is more intense MRI monitoring  Limited data are available to guide individual-level decisions regarding thrombolysis, but dictate vigilant scrutiny for ARIA as the potential cause of stroke-like symptoms. Risk of thrombolysis-related ICH might be stratified in the acute care setting by an individual's likelihood of ARIA as determined by factors such as recency of immunotherapy initiation, APOE genotype, and baseline CAA markers (Table 2). Review of recent monitoring MRIs and performance of immediate MRI are reasonable approaches to identifying the presence of ARIA and its possible presentation as an acute stroke mimic. Pending further data demonstrating the safety of thrombolysis in this setting, treating clinicians might reasonably consider a high level of caution or avoidance of thrombolytic use in patients receiving immunotherapy. Mechanical thrombectomy without thrombolytics for patients with established clinical indications is likely safe and should be performed. American [employer] Association Dec 2024

## 2024-03-13 ENCOUNTER — Other Ambulatory Visit: Payer: Self-pay | Admitting: Sports Medicine

## 2024-03-13 ENCOUNTER — Ambulatory Visit (INDEPENDENT_AMBULATORY_CARE_PROVIDER_SITE_OTHER): Admitting: Nurse Practitioner

## 2024-03-13 ENCOUNTER — Encounter: Payer: Self-pay | Admitting: Nurse Practitioner

## 2024-03-13 VITALS — BP 110/58 | HR 80 | Temp 97.6°F | Ht 66.0 in | Wt 151.4 lb

## 2024-03-13 DIAGNOSIS — J449 Chronic obstructive pulmonary disease, unspecified: Secondary | ICD-10-CM

## 2024-03-13 DIAGNOSIS — J300 Vasomotor rhinitis: Secondary | ICD-10-CM | POA: Diagnosis not present

## 2024-03-13 DIAGNOSIS — J9611 Chronic respiratory failure with hypoxia: Secondary | ICD-10-CM | POA: Diagnosis not present

## 2024-03-13 DIAGNOSIS — J8410 Pulmonary fibrosis, unspecified: Secondary | ICD-10-CM | POA: Diagnosis not present

## 2024-03-13 DIAGNOSIS — M159 Polyosteoarthritis, unspecified: Secondary | ICD-10-CM

## 2024-03-13 DIAGNOSIS — R053 Chronic cough: Secondary | ICD-10-CM

## 2024-03-13 DIAGNOSIS — R41841 Cognitive communication deficit: Secondary | ICD-10-CM | POA: Diagnosis not present

## 2024-03-13 MED ORDER — IPRATROPIUM BROMIDE 0.06 % NA SOLN: 2.0000 | Freq: Three times a day (TID) | NASAL | 12 refills | Status: AC

## 2024-03-13 NOTE — Patient Instructions (Addendum)
 Continue Albuterol  inhaler 2 puffs every 6 hours as needed for shortness of breath or wheezing. Notify if symptoms persist despite rescue inhaler/neb use.  Continue apixiban 5 mg Twice daily. Monitor for any bleeding or excessive bruising Continue montelukast  1 tab daily Continue supplemental oxygen  2-3 lpm with activity and 2 lpm at night for goal >88-90%   Ipratropium nasal spray 2 sprays each nostril up to 3 times a day as needed for runny nose   Oxygen  order sent to [facility] to switch    Follow up in 4 months with Shelly [name] or Shelly [name]. If symptoms do not improve or worsen, please contact office for sooner follow up or seek emergency care.

## 2024-03-13 NOTE — Progress Notes (Unsigned)
 @Patient  ID: Shelly Earnie Harder, female    DOB: [birth year], 73 y.o.   MRN: [id number]  Chief Complaint  Patient presents with   Follow-up    Doing well.  Now living at [facility].    Referring provider: [name], *  HPI: 73 year old female, former smoker followed for cough variant asthma. She was last seen by Dr. Geronimo 12/28/2023. Past medical history significant for HTN, GERD, fibromyalgia.   TEST/EVENTS:  05/2014 allergy  profile > no eos, IgE 424, positive RAST to dust and mold 05/2014 CT sinus chest > sinuses nl; calcified granuloma in the LLL 01/2019 CTA chest:  no evidence of PE. No LAD. Calcified mass in the LLL, likely benign and stable since 2015 09/11/2023 CTA chest: LML and LUL PE; mild clot burden. Patulous esophagus. Some calcified nodes. Several enlarged mediastinal nodes, reactice. Extensive patchy areas of ground glass. Diffuse distribution in left lung, less areas in the right lung. New from prior CT. Some enlargement of PA.  09/14/2023 swallow study: grossly WFL. Pt displayed consistent delayed swallow. Oral lesions. Recommend dysphagia 3 diet and thin liquids   11/22/2023: OV with Sammy Cassar NP Shelly [name] is a 73 year old female who presents for hospital follow up. She was admitted late March for rhinovirus with pneumonia and acute respiratory failure. She was discharged 4/2 on supplemental oxygen .  She was then readmitted 4/7 and had prolonged hospitalization through 4/26 for worsening hypoxic respiratory failure.  CTA chest showed multiple PE in PE with mild clot burden, worsened b/l GGO opacities. Possible infectious process vs inflammatory/underlying ILD vs edema component. Received empiric abx, steroids, and diuretic course. She was treated with HFNC and eventually tapered to 2-3 lpm at rest and 3-4 lpm with exertion prior to discharge to SNF on 4/26.  During her admission, she also had swallow study that revealed some delayed swallowing and was  put on dysphagia 3 (mechanical soft) and thin liquid diet.  Overall, she is doing much better. She experiences breathlessness, particularly when moving without oxygen , and is currently residing in a skilled nursing facility, with plans to move to the assisted living side. Needs an updated O2 order.  She uses a Symbicort  inhaler twice daily, taking two puffs in the morning and two in the evening. She experiences coughing after inhaler use. She does not use a spacer with her inhaler. She receives breathing treatments a couple of times a week, with increased frequency during respiratory distress, and finds them helpful. She has been requiring them more the last few days. Feels like she's having more tightness/coughing. She occasionally produces phlegm when coughing, with clear phlegm. No fevers, chills, hemoptysis, leg swelling. Her medication is managed by the staff at her current residence. Per their MAR, she is taking eliquis  twice daily, as prescribed. No excessive bruising or bleeding.  Still using oxygen  2 lpm 24/7. No low O2 levels noted.     12/28/2023: OV with Dr. Geronimo. On 5 lpm at time of discharge but at this point, using 2 lpm at rest and 3 lpm with exertion. Off prednisone . At independent living. Feels better. Still has some cough and DOE. Postinflammatory pulmonary fibrosis following BOOP followed by pneumococcal pna and rhinovirus ARDS. Significant improvement following tx. Check ESR. Continue oxygen  with exertion and at night. Refer to pulmonary rehab.   03/13/2024: Today - follow up Discussed the use of AI scribe software for clinical note transcription with the patient, who gave verbal consent to proceed.  History of  Present Illness Shelly [name] is a 73 year old female with chronic respiratory issues who presents for evaluation of her oxygen  therapy needs.  She is currently using oxygen  therapy with activity and at night. She is on her last tank of air with [employer] and wants  to switch to [facility] for her oxygen  supply needs.  She has an occasional cough, which she attributes to her past smoking habit, but does not feel it has worsened. No recent illness causing the cough. She experiences nasal dripping and uses a nasal spray flonase . She feels like her breathing is overall stable and improved from her last visit. No fevers, hemoptysis, weight loss, anorexia.   She uses an albuterol  inhaler daily, both morning and night, and finds it more effective than Symbicort , which they discontinued at her facility. She's not sure she needs the albuterol  twice daily.     Allergies  Allergen Reactions   Delsym [Dextromethorphan] Other (See Comments)    Cognitive impairment    Immunization History  Administered Date(s) Administered   INFLUENZA, HIGH DOSE SEASONAL PF 03/08/2021   Influenza Split 02/06/2011, 02/24/2011, 03/05/2013, 04/06/2014   Influenza,inj,Quad PF,6+ Mos 04/24/2015   Influenza,inj,quad, With Preservative 04/27/2017   Influenza-Unspecified 02/22/2011, 02/19/2012, 05/13/2016, 05/02/2017, 05/22/2018, 03/11/2019, 03/03/2020, 03/04/2021, 04/11/2022   PFIZER Comirnaty(Gray Top)Covid-19 Tri-Sucrose Vaccine 02/05/2023   PFIZER(Purple Top)SARS-COV-2 Vaccination 06/23/2019, 07/13/2019, 03/08/2020   PNEUMOCOCCAL CONJUGATE-20 09/24/2021   Pneumococcal Conjugate-13 11/15/2013   Pneumococcal Polysaccharide-23 12/25/2014   Td 06/25/2023   Tdap 04/11/2011, 09/24/2021, 06/25/2023   Unspecified SARS-COV-2 Vaccination 05/21/2021   Zoster Recombinant(Shingrix) 09/28/2017, 02/01/2018   Zoster, Live 05/07/2012, 09/28/2017, 02/01/2018    Past Medical History:  Diagnosis Date   Anxiety    Arthritis    Asthma    Avascular necrosis of talus (HCC)    Cancer (HCC)    vulva pre cancer had surgery   Depression    Gait abnormality 03/20/2017   GERD (gastroesophageal reflux disease)    Hearing loss    very minor   History of pneumonia    Hyperlipidemia    Hypertension     Leg edema    left   Memory difficulty 03/20/2017   PONV (postoperative nausea and vomiting)    Stress incontinence     Tobacco History: Social History   Tobacco Use  Smoking Status Former   Current packs/day: 0.00   Average packs/day: 1 pack/day for 40.0 years (40.0 ttl pk-yrs)   Types: Cigarettes   Start date: 02/05/1967   Quit date: 02/05/2007   Years since quitting: 17.1  Smokeless Tobacco Never   Counseling given: Not Answered   Outpatient Medications Prior to Visit  Medication Sig Dispense Refill   acetaminophen  (TYLENOL ) 325 MG tablet Take 650 mg by mouth as needed.     acyclovir cream (ZOVIRAX) 5 % Apply 1 Application topically 2 (two) times daily as needed (cold sore).     albuterol  (PROAIR  HFA) 108 (90 Base) MCG/ACT inhaler 2 puffs every 4 hours as needed only  if your can't catch your breath 18 g 0   benzonatate  (TESSALON ) 200 MG capsule Take 1 capsule (200 mg total) by mouth 3 (three) times daily as needed for cough. 30 capsule 0   buPROPion  (WELLBUTRIN  XL) 300 MG 24 hr tablet Take 1 tablet (300 mg total) by mouth daily. 90 tablet 0   calcium  carbonate (OSCAL) 1500 (600 Ca) MG TABS tablet Take 1 tablet (1,500 mg total) by mouth at bedtime. 30 tablet 0   denosumab  (  PROLIA ) 60 MG/ML SOLN injection Inject 60 mg into the skin every 6 (six) months.      donepezil  (ARICEPT ) 10 MG tablet Take 1 tablet (10 mg total) by mouth at bedtime. 90 tablet 1   ELIQUIS  5 MG TABS tablet TAKE ONE TABLET BY MOUTH TWICE DAILY 60 tablet 0   FLUoxetine  (PROZAC ) 20 MG capsule TAKE ONE CAPSULE BY MOUTH ONCE DAILY 30 capsule 0   fluticasone  (FLONASE ) 50 MCG/ACT nasal spray Place 2 sprays into both nostrils daily. 16 g 6   hydrocortisone  2.5 % cream Apply topically 2 (two) times daily. 28 g 3   ipratropium-albuterol  (DUONEB) 0.5-2.5 (3) MG/3ML SOLN Take 3 mLs by nebulization every 6 (six) hours as needed (Shortness of breath, cough and/or wheeze). Use this or albuterol  inhaler but not both. 360 mL  1   melatonin 3 MG TABS tablet Take 3 mg by mouth at bedtime.     memantine (NAMENDA) 10 MG tablet Take 1 tablet (10 mg total) by mouth 2 (two) times daily. 60 tablet 5   midodrine  (PROAMATINE ) 5 MG tablet Take 1 tablet (5 mg total) by mouth daily. 30 tablet 0   montelukast  (SINGULAIR ) 10 MG tablet TAKE ONE TABLET BY MOUTH AT BEDTIME 30 tablet 0   Multiple Vitamins-Minerals (CENTRUM SILVER WOMEN 50+) TABS Take 1 tablet by mouth daily.     nystatin cream (MYCOSTATIN) Apply 1 Application topically.     OXYGEN  Inhale into the lungs. O2 2L, Monitor O2 sat Qshift to maintain O2 Sat >95% every shift     pantoprazole  (PROTONIX ) 40 MG tablet TAKE 30 TO 60 MINUTES BEFORE THE FIRST AND LAST MEAL OF THE DAY 180 tablet 1   Potassium 99 MG TABS Take 1 tablet by mouth daily.     rosuvastatin  (CRESTOR ) 10 MG tablet Take 1 tablet (10 mg total) by mouth every evening. 90 tablet 1   traMADol  (ULTRAM ) 50 MG tablet Take 1 tablet (50 mg total) by mouth daily as needed.     Turmeric Curcumin 500 MG CAPS 1000 mg 2 tablets Orally once a day     No facility-administered medications prior to visit.     Review of Systems: as above    Physical Exam:  BP (!) 110/58   Pulse 80   Temp 97.6 F (36.4 C) (Oral)   Ht 5' 6 (1.676 m)   Wt 151 lb 6.4 oz (68.7 kg)   LMP  (LMP Unknown)   SpO2 97% Comment: 2L continuous oxygen  (tank)  BMI 24.44 kg/m   GEN: Pleasant, interactive, well-kempt; chronically ill appearing; obese; in no acute distress HEENT:  Normocephalic and atraumatic. PERRLA. Sclera white. Nasal turbinates pink, moist and patent bilaterally. No rhinorrhea present. Oropharynx pink and moist, without exudate or edema. No lesions, ulcerations NECK:  Supple w/ fair ROM. No lymphadenopathy.   CV: RRR, no m/r/g, no peripheral edema. Pulses intact, +2 bilaterally. No cyanosis, pallor or clubbing. PULMONARY:  Unlabored, regular breathing. Clear bilaterally A&P w/o wheezes/rales/rhonchi. No accessory muscle use.   GI: BS present and normoactive. Soft, non-tender to palpation.  MSK: No erythema, warmth or tenderness. Cap refil <2 sec all extrem.  Neuro: A/Ox3. No focal deficits noted.   Skin: Warm, no lesions or rashe Psych: Normal affect and behavior. Judgement and thought content appropriate.     Lab Results:  CBC    Component Value Date/Time   WBC 6.0 10/26/2023 0000   WBC 16.1 (H) 09/28/2023 0450   RBC 3.39 (A) 10/26/2023 0000  HGB 10.1 (A) 10/26/2023 0000   HCT 33 (A) 10/26/2023 0000   PLT 327 10/26/2023 0000   MCV 95.4 09/28/2023 0450   MCH 31.4 09/28/2023 0450   MCHC 32.9 09/28/2023 0450   RDW 13.9 09/28/2023 0450   LYMPHSABS 2.3 09/28/2023 0450   MONOABS 0.9 09/28/2023 0450   EOSABS 0.1 09/28/2023 0450   BASOSABS 0.0 09/28/2023 0450    BMET    Component Value Date/Time   NA 140 10/26/2023 0000   K 4.3 10/26/2023 0000   CL 103 10/26/2023 0000   CO2 31 (A) 10/26/2023 0000   GLUCOSE 92 09/28/2023 0450   BUN 15 10/26/2023 0000   CREATININE 0.6 10/26/2023 0000   CREATININE 0.69 09/28/2023 0450   CREATININE 0.83 02/12/2015 0847   CALCIUM  8.7 10/26/2023 0000   GFRNONAA >60 09/28/2023 0450   GFRAA >60 01/11/2019 1432    BNP    Component Value Date/Time   BNP 20.9 09/28/2023 0450     Imaging:  No results found.  Administration History     None           No data to display          No results found for: NITRICOXIDE   QUALIFYING WALK FOR POC 03/13/2024  SATURATION QUALIFICATIONS: (This note is used to comply with regulatory documentation for home oxygen )   Patient Saturations on Room Air at Rest = 95%   Patient Saturations on Room Air while Ambulating = 85%   Patient Saturations on 3 Liters of oxygen  while Ambulating = 92%   Please briefly explain why patient needs home oxygen : Patient needs 3L POC pulsed oxygen  while ambulating and 2L continuous oxygen  while stationary and at night to keep SaO2 at or above 88%-90%    Assessment & Plan:    Assessment and Plan Assessment & Plan Chronic obstructive pulmonary disease COPD/asthma. Currently using albuterol  inhaler twice daily. Not using Symbicort  as previously prescribed without any change. Breathing is well-managed with current regimen, but albuterol  is not intended for maintenance therapy. No worsening of cough or breathing difficulties reported. Action plan in place.  - Continue albuterol  inhaler as needed, not necessarily twice daily - Consider returning to Symbicort  if breathing worsens or albuterol  is needed more frequently  ILD Felt to be postinflammatory pulmonary fibrosis following BOOP. Significant improvement following tx with steroids. Will plan to monitor for any evidence of fibrotic progression. At this time, she is clinically improved. Will determine timing of next CT based on PFT and clinical progression.  - Plan to repeat spiro/DLCO at follow up.  - Encouraged graded exercises   Chronic cough Persistent cough likely related to COPD and postnasal drainage. No indication of acute illness or infection contributing to the cough. - Add on ipratropium nasal spray for nasal drainage and limit upper airway irritation   Chronic respiratory failure on supplemental O2 Requires supplemental oxygen  with exertion and at night. Able to maintain saturations on room air at rest. Plans to switch medical supply company from [employer] to [employer] for better service and equipment. Qualified for POC today - 3 lpm. Goal >88-90% - Order portable oxygen  concentrator with three-liter setting from [employer] - Conduct a qualifying walk test to facilitate switch to [employer] - Provide instructions on POC usage and battery management  Vasomotor rhinitis Experiencing nasal drip, possibly exacerbated by oxygen  use. Currently using an intranasal steroid nasal spray. - Prescribe ipratropium nasal spray, use up to three times daily - Monitor for dryness and adjust usage accordingly  Acute pulmonary embolism  (HCC) Compliant with chronic AC. No excessive bruising or bleeding.    I spent 35 minutes of dedicated to the care of this patient on the date of this encounter to include pre-visit review of records, face-to-face time with the patient discussing conditions above, post visit ordering of testing, clinical documentation with the electronic health record, making appropriate referrals as documented, and communicating necessary findings to members of the patients care team.  Comer LULLA Rouleau, NP 03/13/2024  Pt aware and understands NP's role.

## 2024-03-13 NOTE — Telephone Encounter (Signed)
 Patient has request for refill on 03/13/2024. Patient last refill was 02/09/2024. Patient has contract on file dated 02/13/2024. Patient has upcoming appointment 05/16/2024. Medication pend and sent to PCP Louvella, Prashanthi, MD) for approval.     Patient eliquis  medication was filled two weeks ago with a 30 day supply.

## 2024-03-14 ENCOUNTER — Encounter: Payer: Self-pay | Admitting: Nurse Practitioner

## 2024-03-14 DIAGNOSIS — M810 Age-related osteoporosis without current pathological fracture: Secondary | ICD-10-CM | POA: Diagnosis not present

## 2024-03-14 NOTE — Assessment & Plan Note (Signed)
 Ongoing O2 requirement. Unable to maintain saturations >88% on room air. Continue supplemental O2 2-3 lpm. Goal >88-90%. Will send order to [facility] to switch DME for home O2 concentrator and POC. Goal >88-90%  QUALIFYING WALK FOR POC 03/13/2024  SATURATION QUALIFICATIONS: (This note is used to comply with regulatory documentation for home oxygen )   Patient Saturations on Room Air at Rest = 95%   Patient Saturations on Room Air while Ambulating = 85%   Patient Saturations on 3 Liters of oxygen  while Ambulating = 92%   Please briefly explain why patient needs home oxygen : Patient needs 3L POC pulsed oxygen  while ambulating and 2L continuous oxygen  while stationary and at night to keep SaO2 at or above 88%-90%.   Patient Instructions  Continue Albuterol  inhaler 2 puffs every 6 hours as needed for shortness of breath or wheezing. Notify if symptoms persist despite rescue inhaler/neb use.  Continue apixiban 5 mg Twice daily. Monitor for any bleeding or excessive bruising Continue montelukast  1 tab daily Continue supplemental oxygen  2-3 lpm with activity and 2 lpm at night for goal >88-90%   Ipratropium nasal spray 2 sprays each nostril up to 3 times a day as needed for runny nose   Oxygen  order sent to [facility] to switch    Follow up in 4 months with Dr. Geronimo or Izetta Malachy PIETY. If symptoms do not improve or worsen, please contact office for sooner follow up or seek emergency care.

## 2024-03-18 DIAGNOSIS — R41841 Cognitive communication deficit: Secondary | ICD-10-CM | POA: Diagnosis not present

## 2024-03-20 ENCOUNTER — Other Ambulatory Visit: Payer: Self-pay | Admitting: Sports Medicine

## 2024-03-20 DIAGNOSIS — J45991 Cough variant asthma: Secondary | ICD-10-CM

## 2024-03-20 DIAGNOSIS — R41841 Cognitive communication deficit: Secondary | ICD-10-CM | POA: Diagnosis not present

## 2024-03-21 ENCOUNTER — Telehealth: Payer: Self-pay

## 2024-03-21 DIAGNOSIS — J9611 Chronic respiratory failure with hypoxia: Secondary | ICD-10-CM

## 2024-03-21 NOTE — Telephone Encounter (Signed)
 Copied from CRM #[id number]. Topic: Clinical - Order For Equipment >> Mar 20, 2024 11:30 AM Devaughn RAMAN wrote: Reason for CRM: Patient called requesting a prescription for a Humidifier, patient stated [facility] advised NP Cobb would need to write a prescription for it as she is having nose bleeds.  Pt states she has a concentrator as she is on O2 24/7 and she does not think it is moist enough as this has caused her nose bleeds. Pt states she gets his through [facility]. Izetta, is there any specifics that need to go wth the order if you are okay with placing one?

## 2024-03-22 DIAGNOSIS — H53141 Visual discomfort, right eye: Secondary | ICD-10-CM | POA: Diagnosis not present

## 2024-03-22 DIAGNOSIS — R41841 Cognitive communication deficit: Secondary | ICD-10-CM | POA: Diagnosis not present

## 2024-03-22 DIAGNOSIS — H5203 Hypermetropia, bilateral: Secondary | ICD-10-CM | POA: Diagnosis not present

## 2024-03-22 DIAGNOSIS — H16141 Punctate keratitis, right eye: Secondary | ICD-10-CM | POA: Diagnosis not present

## 2024-03-22 DIAGNOSIS — H52223 Regular astigmatism, bilateral: Secondary | ICD-10-CM | POA: Diagnosis not present

## 2024-03-22 DIAGNOSIS — H524 Presbyopia: Secondary | ICD-10-CM | POA: Diagnosis not present

## 2024-03-25 DIAGNOSIS — R41841 Cognitive communication deficit: Secondary | ICD-10-CM | POA: Diagnosis not present

## 2024-03-25 NOTE — Telephone Encounter (Signed)
 Ok to add humidifier to supplemental O2. Thanks

## 2024-03-27 ENCOUNTER — Non-Acute Institutional Stay: Admitting: Internal Medicine

## 2024-03-27 ENCOUNTER — Encounter: Payer: Self-pay | Admitting: Internal Medicine

## 2024-03-27 VITALS — BP 118/68 | HR 88 | Temp 97.3°F | Resp 20 | Ht 66.0 in | Wt 146.1 lb

## 2024-03-27 DIAGNOSIS — M159 Polyosteoarthritis, unspecified: Secondary | ICD-10-CM | POA: Diagnosis not present

## 2024-03-27 DIAGNOSIS — L299 Pruritus, unspecified: Secondary | ICD-10-CM | POA: Diagnosis not present

## 2024-03-27 DIAGNOSIS — R41841 Cognitive communication deficit: Secondary | ICD-10-CM | POA: Diagnosis not present

## 2024-03-27 NOTE — Telephone Encounter (Signed)
 I called and spoke to pt. Pt informed of the order for humidity added to her O2. Pt verbalized understanding. NFN

## 2024-03-27 NOTE — Progress Notes (Signed)
 Location: [facility] educational needs teacher of Service:  Clinic (12)  Provider:   Code Status:  Goals of Care:     11/27/2023   10:48 AM  Advanced Directives  Does Patient Have a Medical Advance Directive? Yes  Type of Estate agent of [place];Living will  Does patient want to make changes to medical advance directive? No - Patient declined  Copy of Healthcare Power of Attorney in Chart? No - copy requested  Would patient like information on creating a medical advance directive? No - Patient declined     Chief Complaint  Patient presents with   Acute Visit    HPI: Patient is a 73 y.o. female seen today for an acute visit for Itching and Generalized joint pain   [place] in [place]  She moved there after prolong stay in SNF  Has h/o Generalized OA, MCI , ILD on Chronic Oxygen , PE on Eliquis   Discussed the use of AI scribe software for clinical note transcription with the patient, who gave verbal consent to proceed.  History of Present Illness   Shelly [name] is a 73 year old female who presents with generalized itching and pain.  She has experienced generalized itching for a few months, worsening recently, affecting her entire body, especially the chin, nose, back of the neck, and shoulders. Her skin is thin and tears easily, possibly exacerbated by Band-Aid use. She applies CeraVe and hydrocortisone  cream for relief.  Pain is present in the lower and middle back, shoulder, neck, and joints, particularly when using a nasal spray. She manages pain with tramadol , turmeric, and acetaminophen  as needed.  Following a fall with head impact, she experienced memory loss and transitioned from independent living to skilled nursing, now back to independent living. She requires continuous oxygen  and is adjusting to her new environment.  She suspects environmental factors, such as an old heating system, may contribute to her symptoms, as she did not  experience itching in previous living situations. She is on Namenda but does not associate it with increased itching. She has a history of dry skin and regular use of moisturizing lotions. She has not recently consulted a dermatologist but has a history with Dr. Devere Becker at [facility].        Past Medical History:  Diagnosis Date   Anxiety    Arthritis    Asthma    Avascular necrosis of talus (HCC)    Cancer (HCC)    vulva pre cancer had surgery   Depression    Gait abnormality 03/20/2017   GERD (gastroesophageal reflux disease)    Hearing loss    very minor   History of pneumonia    Hyperlipidemia    Hypertension    Leg edema    left   Memory difficulty 03/20/2017   PONV (postoperative nausea and vomiting)    Stress incontinence     Past Surgical History:  Procedure Laterality Date   ANKLE FUSION  2011   right multiple    ANKLE FUSION     rear ankle fusion   ANKLE SURGERY  2010   right cordicompression   AORTIC ARCH ANGIOGRAPHY N/A 12/11/2017   Procedure: AORTIC ARCH ANGIOGRAPHY;  Surgeon: Harvey Carlin BRAVO, MD;  Location: [facility];  Service: Cardiovascular;  Laterality: N/A;   APPENDECTOMY  05/10/2016   BELOW KNEE LEG AMPUTATION     CATARACT EXTRACTION W/ INTRAOCULAR LENS IMPLANT Left    LAPAROSCOPIC APPENDECTOMY N/A  05/10/2016   Procedure: APPENDECTOMY LAPAROSCOPIC;  Surgeon: Vicenta Poli, MD;  Location: [facility] OR;  Service: General;  Laterality: N/A;   LUMBAR LAMINECTOMY/DECOMPRESSION MICRODISCECTOMY N/A 09/09/2015   Procedure:  L4-S1 Decompression/ Discetomy;  Surgeon: Donaciano Sprang, MD;  Location: [facility] OR;  Service: Orthopedics;  Laterality: N/A;   SKIN GRAFT     SPINAL CORD STIMULATOR BATTERY EXCHANGE  06/23/2023   SPINAL CORD STIMULATOR INSERTION N/A 01/10/2019   Procedure: LUMBAR SPINAL CORD STIMULATOR INSERTION;  Surgeon: Sprang Donaciano, MD;  Location: [facility] OR;  Service: Orthopedics;  Laterality: N/A;  2.5 hrs   TONSILLECTOMY      TUBAL LIGATION  1983   VULVECTOMY N/A 01/28/2015   Procedure: WIDE EXCISION VULVECTOMY;  Surgeon: Hargis Paradise, MD;  Location: [facility] ORS;  Service: Gynecology;  Laterality: N/A;    Allergies  Allergen Reactions   Delsym [Dextromethorphan] Other (See Comments)    Cognitive impairment    Outpatient Encounter Medications as of 03/27/2024  Medication Sig   acetaminophen  (TYLENOL ) 325 MG tablet Take 650 mg by mouth as needed.   acyclovir cream (ZOVIRAX) 5 % Apply 1 Application topically 2 (two) times daily as needed (cold sore).   albuterol  (PROAIR  HFA) 108 (90 Base) MCG/ACT inhaler 2 puffs every 4 hours as needed only  if your can't catch your breath   benzonatate  (TESSALON ) 200 MG capsule TAKE ONE CAPSULE BY MOUTH THREE TIMES DAILY AS NEEDED FOR COUGH   buPROPion  (WELLBUTRIN  XL) 300 MG 24 hr tablet Take 1 tablet (300 mg total) by mouth daily.   calcium  carbonate (OSCAL) 1500 (600 Ca) MG TABS tablet Take 1 tablet (1,500 mg total) by mouth at bedtime.   denosumab  (PROLIA ) 60 MG/ML SOLN injection Inject 60 mg into the skin every 6 (six) months.    donepezil  (ARICEPT ) 10 MG tablet Take 1 tablet (10 mg total) by mouth at bedtime.   ELIQUIS  5 MG TABS tablet TAKE ONE TABLET BY MOUTH TWICE DAILY   FLUoxetine  (PROZAC ) 20 MG capsule TAKE ONE CAPSULE BY MOUTH ONCE DAILY   hydrocortisone  2.5 % cream Apply topically 2 (two) times daily.   ipratropium (ATROVENT) 0.06 % nasal spray Place 2 sprays into both nostrils 3 (three) times daily.   melatonin 3 MG TABS tablet Take 3 mg by mouth at bedtime.   memantine (NAMENDA) 10 MG tablet Take 1 tablet (10 mg total) by mouth 2 (two) times daily.   midodrine  (PROAMATINE ) 5 MG tablet Take 1 tablet (5 mg total) by mouth daily.   montelukast  (SINGULAIR ) 10 MG tablet TAKE ONE TABLET BY MOUTH AT BEDTIME   Multiple Vitamins-Minerals (CENTRUM SILVER WOMEN 50+) TABS Take 1 tablet by mouth daily.   nystatin cream (MYCOSTATIN) Apply 1 Application topically.   OXYGEN  Inhale  into the lungs. O2 2L, Monitor O2 sat Qshift to maintain O2 Sat >95% every shift   pantoprazole  (PROTONIX ) 40 MG tablet TAKE 30 TO 60 MINUTES BEFORE THE FIRST AND LAST MEAL OF THE DAY   Potassium 99 MG TABS Take 1 tablet by mouth daily.   rosuvastatin  (CRESTOR ) 10 MG tablet Take 1 tablet (10 mg total) by mouth every evening.   traMADol  (ULTRAM ) 50 MG tablet Take 1 tablet (50 mg total) by mouth daily as needed.   Turmeric Curcumin 500 MG CAPS 1000 mg 2 tablets Orally once a day   fluticasone  (FLONASE ) 50 MCG/ACT nasal spray Place 2 sprays into both nostrils daily. (Patient not taking: Reported on 03/27/2024)   ipratropium-albuterol  (DUONEB) 0.5-2.5 (3) MG/3ML SOLN Take  3 mLs by nebulization every 6 (six) hours as needed (Shortness of breath, cough and/or wheeze). Use this or albuterol  inhaler but not both.   No facility-administered encounter medications on file as of 03/27/2024.    Review of Systems:  Review of Systems  Constitutional:  Negative for activity change and appetite change.  HENT: Negative.    Respiratory:  Negative for cough and shortness of breath.   Cardiovascular:  Negative for leg swelling.  Gastrointestinal:  Negative for constipation.  Genitourinary: Negative.   Musculoskeletal:  Positive for arthralgias. Negative for gait problem and myalgias.  Skin: Negative.        itching  Neurological:  Negative for dizziness and weakness.  Psychiatric/Behavioral:  Positive for confusion. Negative for dysphoric mood and sleep disturbance.     Health Maintenance  Topic Date Due   FOOT EXAM  Never done   Diabetic kidney evaluation - Urine ACR  Never done   Hepatitis C Screening  Never done   OPHTHALMOLOGY EXAM  05/10/2022   Influenza Vaccine  01/05/2024   COVID-19 Vaccine (6 - 2025-26 season) 02/05/2024   Medicare Annual Wellness (AWV)  03/29/2024   HEMOGLOBIN A1C  04/27/2024   Diabetic kidney evaluation - eGFR measurement  10/25/2024   Colonoscopy  03/22/2026    DTaP/Tdap/Td (5 - Td or Tdap) 06/24/2033   Pneumococcal Vaccine: 50+ Years  Completed   DEXA SCAN  Completed   Zoster Vaccines- Shingrix  Completed   Meningococcal B Vaccine  Aged Out   Mammogram  Discontinued    Physical Exam: Vitals:   03/27/24 1130  BP: 118/68  Pulse: 88  Resp: 20  Temp: (!) 97.3 F (36.3 C)  SpO2: 94%  Weight: 146 lb 1.6 oz (66.3 kg)  Height: 5' 6 (1.676 m)   Body mass index is 23.58 kg/m. Physical Exam Vitals reviewed.  Constitutional:      Appearance: Normal appearance.  HENT:     Head: Normocephalic.     Nose: Nose normal.     Mouth/Throat:     Mouth: Mucous membranes are moist.     Pharynx: Oropharynx is clear.  Eyes:     Pupils: Pupils are equal, round, and reactive to light.  Cardiovascular:     Heart sounds: No murmur heard. Pulmonary:     Effort: Pulmonary effort is normal.  Musculoskeletal:        General: No swelling.     Cervical back: Neck supple.  Skin:    General: Skin is warm.     Comments: No Rash Just itching in her back and arms  Neurological:     General: No focal deficit present.     Mental Status: She is alert.  Psychiatric:        Mood and Affect: Mood normal.        Thought Content: Thought content normal.     Labs reviewed: Basic Metabolic Panel: Recent Labs    08/30/23 1745 08/31/23 0801 09/03/23 0242 09/04/23 0357 09/05/23 0330 09/06/23 0404 09/11/23 0541 09/23/23 0438 09/25/23 0436 09/28/23 0450 10/10/23 0000 10/26/23 0000  NA  --    < >  --  138 140 139   < > 136 137 135 140 140  K  --    < >  --  3.2* 3.2* 3.7   < > 4.0 4.2 3.9 4.3 4.3  CL  --    < >  --  101 104 105   < > 96* 99 96* 103 103  CO2  --    < >  --  26 27 22    < > 29 32 28 31* 31*  GLUCOSE  --    < >  --  150* 117* 95   < > 84 81 92  --   --   BUN  --    < >  --  12 15 16    < > 16 14 20  33* 15  CREATININE 1.18*   < >  --  0.68 0.66 0.73   < > 0.57 0.62 0.69 0.5 0.6  CALCIUM   --    < >  --  7.6* 7.8* 7.9*   < > 8.5* 8.9 8.9 8.3*  8.7  MG  --    < > 1.8 2.0 2.2 1.8  --   --  2.0 1.9  --   --   PHOS  --   --  2.7 2.9 2.3*  --   --   --   --   --   --   --   TSH 1.153  --   --   --   --   --   --   --   --   --   --  0.74   < > = values in this interval not displayed.   Liver Function Tests: Recent Labs    09/03/23 0200 09/05/23 0330 09/06/23 0404 09/11/23 0541 10/26/23 0000  AST 53*  --  42* 29 19  ALT 46*  --  63* 35 15  ALKPHOS 64  --  57 71  --   BILITOT 1.2  --  0.7 0.9  --   PROT 5.9*  --  5.4* 6.0*  --   ALBUMIN  2.5*   < > 2.2* 2.4* 3.2*   < > = values in this interval not displayed.   Recent Labs    04/01/23 1847  LIPASE 14   No results for input(s): AMMONIA in the last 8760 hours. CBC: Recent Labs    09/23/23 0438 09/25/23 0436 09/28/23 0450 10/10/23 0000 10/17/23 0000 10/26/23 0000  WBC 12.9* 13.2* 16.1* 8.9 6.6 6.0  NEUTROABS  --  9.3* 12.6*  --  3,478.00 3,324.00  HGB 9.6* 10.1* 11.0* 10.0* 10.0* 10.1*  HCT 29.7* 31.4* 33.4* 31* 32* 33*  MCV 96.4 96.3 95.4  --   --   --   PLT 310 314 322 234 276 327   Lipid Panel: Recent Labs    10/26/23 0000  CHOL 139  HDL 56  LDLCALC 69  TRIG 62   Lab Results  Component Value Date   HGBA1C 5.8 10/26/2023    Procedures since last visit: No results found.  Assessment/Plan  Assessment and Plan    Generalized pruritus and xerosis cutis Chronic itching and dry skin, primarily affecting the back, neck, and shoulders, likely due to xerosis.  Does not seem  drug reaction to Namenda. - Apply hydrocortisone  2.5% cream to affected areas. - Use CeraVe Red top or Eucerin moisturizing cream generously after showering, especially on the back. - Mix hydrocortisone  cream with moisturizer for application on the back. [facility] for further evaluation.  Chronic pain, multiple sites Chronic pain in lower back, middle back, shoulder, neck, and joints. Tramadol  effective for pain management.  No Swelling or any signs of  Inflammatory arthritis  - Continue tramadol  for pain management. - Add Tylenol  (acetaminophen ) 650 mg, up to three times a day, not exceeding 2 grams per  day, for additional pain relief.       Follow with [facility]/tests ordered:  * No order type specified * Next appt:  05/16/2024

## 2024-03-27 NOTE — Patient Instructions (Addendum)
 Combine Cerave itch relief Moisturizing cream Red with your Blue Cerave and apply on the back and arms Can also mix small amount of Hydrocortisone  ointment with it Call [facility] dermatology to make appointment for follow up  You can take Tylenol  650 mg 3/day to supplement your Tramadol  for generalized pain

## 2024-03-28 ENCOUNTER — Telehealth: Payer: Self-pay

## 2024-03-28 NOTE — Telephone Encounter (Signed)
 Copied from CRM [id number]. Topic: Clinical - Order For Equipment >> Mar 28, 2024  2:41 PM Merlynn A wrote: Reason for CRM: Patient called in requesting to have Oxygen  Tank order sent to [facility]. Please send over for patient. Any question patient can be reached at [phone].

## 2024-03-28 NOTE — Telephone Encounter (Signed)
 Returned call to patient and advised her of providers response. She will see pulmonology tomorrow and discuss with them.         Shelly Madden, MD to [facility] (Selected Message)     03/28/24  3:42 PM Pulmonology office have been handling for o2 supple, plz see their notes. She needs to contact their office.

## 2024-03-29 ENCOUNTER — Telehealth: Payer: Self-pay | Admitting: Nurse Practitioner

## 2024-03-29 ENCOUNTER — Encounter

## 2024-03-29 DIAGNOSIS — R41841 Cognitive communication deficit: Secondary | ICD-10-CM | POA: Diagnosis not present

## 2024-03-29 NOTE — Telephone Encounter (Signed)
 The patient has received all current DME orders. She stated that [facility] requested a prescription for two or three oxygen  tanks that last approximately five hours, as her current POC only lasts about two and a half hours. She basically would like an O2 tank that last longer.

## 2024-03-29 NOTE — Telephone Encounter (Signed)
 Copied from CRM [id number]. Topic: Clinical - Order For Equipment >> Mar 29, 2024 10:13 AM Nathanel DEL wrote: Reason for CRM: pt is wanting to get oxygen  tanks today for a meeting at church that will last over 5 hrs.  She only has a POC that last 2,5 hrs. She wants the larger tanks.  Pt uses [facility].   She called [facility] and [facility] needs order today. Pt had appt this am, but it was cancelled and she did not know that. aNd she was going to get order today when she saw the dr. >> Mar 29, 2024 10:27 AM Nathanel DEL wrote: Pt needs Oxygen  tank order.  Please forward to [facility] asap

## 2024-04-01 DIAGNOSIS — R41841 Cognitive communication deficit: Secondary | ICD-10-CM | POA: Diagnosis not present

## 2024-04-03 ENCOUNTER — Other Ambulatory Visit: Payer: Self-pay | Admitting: Sports Medicine

## 2024-04-03 DIAGNOSIS — J45991 Cough variant asthma: Secondary | ICD-10-CM

## 2024-04-03 DIAGNOSIS — R41841 Cognitive communication deficit: Secondary | ICD-10-CM | POA: Diagnosis not present

## 2024-04-03 NOTE — Telephone Encounter (Signed)
 Medication was signed two weeks ago. Would you like for medication to be refused ?

## 2024-04-04 ENCOUNTER — Telehealth: Payer: Self-pay

## 2024-04-04 ENCOUNTER — Other Ambulatory Visit: Payer: Self-pay

## 2024-04-04 NOTE — Telephone Encounter (Signed)
 They won't usually let them keep their POC and have portable oxygen  tanks so is she wanting to get rid of her POC and switch back to tanks?

## 2024-04-04 NOTE — Telephone Encounter (Signed)
 Zebedee called regarding pts medication that was refused. Informed her that the medication was sent to the pharmacy on 03/20/2024 with 30 capsules given.

## 2024-04-05 DIAGNOSIS — R41841 Cognitive communication deficit: Secondary | ICD-10-CM | POA: Diagnosis not present

## 2024-04-08 ENCOUNTER — Encounter: Payer: Self-pay | Admitting: Radiology

## 2024-04-08 DIAGNOSIS — R41841 Cognitive communication deficit: Secondary | ICD-10-CM | POA: Diagnosis not present

## 2024-04-08 DIAGNOSIS — M6281 Muscle weakness (generalized): Secondary | ICD-10-CM | POA: Diagnosis not present

## 2024-04-08 DIAGNOSIS — M2550 Pain in unspecified joint: Secondary | ICD-10-CM | POA: Diagnosis not present

## 2024-04-09 ENCOUNTER — Ambulatory Visit (HOSPITAL_COMMUNITY)
Admission: RE | Admit: 2024-04-09 | Discharge: 2024-04-09 | Disposition: A | Discharge status: Home / Self Care | Source: Ambulatory Visit | Attending: Cardiology | Admitting: Cardiology

## 2024-04-09 ENCOUNTER — Ambulatory Visit: Payer: Self-pay | Admitting: Cardiology

## 2024-04-09 DIAGNOSIS — I779 Disorder of arteries and arterioles, unspecified: Secondary | ICD-10-CM | POA: Insufficient documentation

## 2024-04-09 DIAGNOSIS — I6523 Occlusion and stenosis of bilateral carotid arteries: Secondary | ICD-10-CM | POA: Diagnosis not present

## 2024-04-10 DIAGNOSIS — R41841 Cognitive communication deficit: Secondary | ICD-10-CM | POA: Diagnosis not present

## 2024-04-10 DIAGNOSIS — M6281 Muscle weakness (generalized): Secondary | ICD-10-CM | POA: Diagnosis not present

## 2024-04-10 DIAGNOSIS — M2550 Pain in unspecified joint: Secondary | ICD-10-CM | POA: Diagnosis not present

## 2024-04-12 DIAGNOSIS — R41841 Cognitive communication deficit: Secondary | ICD-10-CM | POA: Diagnosis not present

## 2024-04-12 DIAGNOSIS — M2550 Pain in unspecified joint: Secondary | ICD-10-CM | POA: Diagnosis not present

## 2024-04-12 DIAGNOSIS — M6281 Muscle weakness (generalized): Secondary | ICD-10-CM | POA: Diagnosis not present

## 2024-04-15 ENCOUNTER — Other Ambulatory Visit: Payer: Self-pay | Admitting: Sports Medicine

## 2024-04-15 ENCOUNTER — Other Ambulatory Visit: Payer: Self-pay | Admitting: Nurse Practitioner

## 2024-04-15 DIAGNOSIS — M6281 Muscle weakness (generalized): Secondary | ICD-10-CM | POA: Diagnosis not present

## 2024-04-15 DIAGNOSIS — R41841 Cognitive communication deficit: Secondary | ICD-10-CM | POA: Diagnosis not present

## 2024-04-15 DIAGNOSIS — M2550 Pain in unspecified joint: Secondary | ICD-10-CM | POA: Diagnosis not present

## 2024-04-15 DIAGNOSIS — J45991 Cough variant asthma: Secondary | ICD-10-CM

## 2024-04-16 ENCOUNTER — Ambulatory Visit: Payer: Self-pay

## 2024-04-16 ENCOUNTER — Other Ambulatory Visit: Payer: Self-pay | Admitting: Nurse Practitioner

## 2024-04-16 ENCOUNTER — Other Ambulatory Visit: Payer: Self-pay | Admitting: Sports Medicine

## 2024-04-16 NOTE — Telephone Encounter (Signed)
Noted appointment confirmed .

## 2024-04-16 NOTE — Telephone Encounter (Signed)
 FYI Only or Action Required?: FYI only for provider: appointment scheduled on 05/08/2024.  Patient was last seen in primary care on 03/27/2024 by Charlanne Fredia CROME, MD.  Called Nurse Triage reporting Back Pain.  Symptoms began several years ago.  Interventions attempted: Prescription medications: Tramadol , Muscle relaxer.  Symptoms are: gradually worsening.  Triage Disposition: See PCP Within 2 Weeks  Patient/caregiver understands and will follow disposition?: Yes             Copied from CRM [id number]. Topic: Clinical - Red Word Triage >> Apr 16, 2024  9:12 AM Shelly [name] wrote: Red Word that prompted transfer to Nurse Triage: pain and aches back neck shoulder   ----------------------------------------------------------------------- From previous Reason for Contact - Scheduling: Patient/patient representative is calling to schedule an appointment. Refer to attachments for appointment information. Reason for Disposition  Back pain is a chronic symptom (recurrent or ongoing AND present > 4 weeks)  Answer Assessment - Initial Assessment Questions 1. ONSET: When did the pain begin? (e.g., minutes, hours, days)     Ongoing for years but had a fall and symptoms have worsened  2. LOCATION: Where does it hurt? (upper, mid or lower back)     Upper back near  3. SEVERITY: How bad is the pain?  (e.g., Scale 1-10; mild, moderate, or severe)    Between  Moderate and severe  4. PATTERN: Is the pain constant? (e.g., yes, no; constant, intermittent)      Intermittent  5. RADIATION: Does the pain shoot into your legs or somewhere else?     shoulder and neck area  6. CAUSE:  What do you think is causing the back pain?     An accident in the past and a fall  7. MEDICINES: What have you taken so far for the pain? (e.g., nothing, acetaminophen , NSAIDS)     Muscle relaxer, tramadol   8. NEUROLOGIC SYMPTOMS: Do you have any weakness, numbness, or problems with bowel/bladder  control?     Weakness in hand  9. OTHER SYMPTOMS: Do you have any other symptoms? (e.g., fever, abdomen pain, burning with urination, blood in urine)       Hand pain near base of thumb  Protocols used: Back Pain-A-AH

## 2024-04-17 DIAGNOSIS — M6281 Muscle weakness (generalized): Secondary | ICD-10-CM | POA: Diagnosis not present

## 2024-04-17 DIAGNOSIS — M2550 Pain in unspecified joint: Secondary | ICD-10-CM | POA: Diagnosis not present

## 2024-04-17 DIAGNOSIS — R41841 Cognitive communication deficit: Secondary | ICD-10-CM | POA: Diagnosis not present

## 2024-04-19 DIAGNOSIS — M6281 Muscle weakness (generalized): Secondary | ICD-10-CM | POA: Diagnosis not present

## 2024-04-19 DIAGNOSIS — R41841 Cognitive communication deficit: Secondary | ICD-10-CM | POA: Diagnosis not present

## 2024-04-19 DIAGNOSIS — M2550 Pain in unspecified joint: Secondary | ICD-10-CM | POA: Diagnosis not present

## 2024-04-22 DIAGNOSIS — M6281 Muscle weakness (generalized): Secondary | ICD-10-CM | POA: Diagnosis not present

## 2024-04-22 DIAGNOSIS — R41841 Cognitive communication deficit: Secondary | ICD-10-CM | POA: Diagnosis not present

## 2024-04-22 DIAGNOSIS — M2550 Pain in unspecified joint: Secondary | ICD-10-CM | POA: Diagnosis not present

## 2024-04-24 ENCOUNTER — Other Ambulatory Visit: Payer: Self-pay | Admitting: Nurse Practitioner

## 2024-04-24 ENCOUNTER — Telehealth: Payer: Self-pay | Admitting: Nurse Practitioner

## 2024-04-24 DIAGNOSIS — M2550 Pain in unspecified joint: Secondary | ICD-10-CM | POA: Diagnosis not present

## 2024-04-24 DIAGNOSIS — R41841 Cognitive communication deficit: Secondary | ICD-10-CM | POA: Diagnosis not present

## 2024-04-24 DIAGNOSIS — J45991 Cough variant asthma: Secondary | ICD-10-CM

## 2024-04-24 DIAGNOSIS — M6281 Muscle weakness (generalized): Secondary | ICD-10-CM | POA: Diagnosis not present

## 2024-04-24 DIAGNOSIS — M159 Polyosteoarthritis, unspecified: Secondary | ICD-10-CM

## 2024-04-24 NOTE — Telephone Encounter (Unsigned)
 Copied from CRM [id number]. Topic: Clinical - Medication Refill >> Apr 24, 2024 10:51 AM Chiquita SQUIBB wrote: Medication:  benzonatate  benzonatate  (TESSALON ) 200 MG capsule  traMADol  (ULTRAM ) 50 MG tablet  Patient stated that last time she received 30 days worth and the patient usually takes them both one in the morning and one in the evening so she needs more than the 30 days worth and that is not how it was prescribed in the past, as she used to get 3 X 30 on both medications.   Has the patient contacted their pharmacy? Yes (Agent: If no, request that the patient contact the pharmacy for the refill. If patient does not wish to contact the pharmacy document the reason why and proceed with request.) (Agent: If yes, when and what did the pharmacy advise?)  This is the patient's preferred pharmacy:  [facility] [place], [place] - [address] [facility] [address] [place] [address] Phone: [phone] Fax: [phone]  Is this the correct pharmacy for this prescription? Yes If no, delete pharmacy and type the correct one.   Has the prescription been filled recently? No  Is the patient out of the medication? No  Has the patient been seen for an appointment in the last year OR does the patient have an upcoming appointment? Yes  Can we respond through MyChart? Yes  Agent: Please be advised that Rx refills may take up to 3 business days. We ask that you follow-up with your pharmacy.

## 2024-04-24 NOTE — Telephone Encounter (Signed)
 Copied from CRM [id number]. Topic: Clinical - Medication Refill >> Apr 24, 2024 10:58 AM Diannia H wrote: Medication: ipratropium (ATROVENT ) 0.06 % nasal spray  Has the patient contacted their pharmacy? Yes (Agent: If no, request that the patient contact the pharmacy for the refill. If patient does not wish to contact the pharmacy document the reason why and proceed with request.) (Agent: If yes, when and what did the pharmacy advise?)  This is the patient's preferred pharmacy:  [facility] [place], [place] - [address] [facility] [address] [place] [address] Phone: [phone] Fax: [phone]  Is this the correct pharmacy for this prescription? Yes If no, delete pharmacy and type the correct one.   Has the prescription been filled recently? Yes  Is the patient out of the medication? Yes  Has the patient been seen for an appointment in the last year OR does the patient have an upcoming appointment? Yes  Can we respond through MyChart? Yes  Agent: Please be advised that Rx refills may take up to 3 business days. We ask that you follow-up with your pharmacy.

## 2024-04-24 NOTE — Telephone Encounter (Signed)
 Patient is requesting a refill of the following medications: Requested Prescriptions   Pending Prescriptions Disp Refills   traMADol  (ULTRAM ) 50 MG tablet [Pharmacy Med Name: tramadol  50 mg tablet] 30 tablet 0    Sig: Take 1 tablet (50 mg total) by mouth daily as needed.   benzonatate  (TESSALON ) 200 MG capsule [Pharmacy Med Name: benzonatate  200 mg capsule] 30 capsule 0    Sig: TAKE ONE CAPSULE BY MOUTH THREE TIMES DAILY AS NEEDED FOR COUGH    Date of last refill: 03/13/24  Refill amount: 30  Treatment agreement date: 02/2024   Please advise on tessalon , as this is usually a temporary medication

## 2024-04-25 ENCOUNTER — Ambulatory Visit: Payer: Self-pay

## 2024-04-25 ENCOUNTER — Other Ambulatory Visit: Payer: Self-pay | Admitting: Nurse Practitioner

## 2024-04-25 DIAGNOSIS — M6281 Muscle weakness (generalized): Secondary | ICD-10-CM | POA: Diagnosis not present

## 2024-04-25 DIAGNOSIS — Z86718 Personal history of other venous thrombosis and embolism: Secondary | ICD-10-CM | POA: Diagnosis not present

## 2024-04-25 DIAGNOSIS — Z0000 Encounter for general adult medical examination without abnormal findings: Secondary | ICD-10-CM | POA: Diagnosis not present

## 2024-04-25 DIAGNOSIS — N3281 Overactive bladder: Secondary | ICD-10-CM | POA: Diagnosis not present

## 2024-04-25 DIAGNOSIS — M2550 Pain in unspecified joint: Secondary | ICD-10-CM | POA: Diagnosis not present

## 2024-04-25 DIAGNOSIS — G300 Alzheimer's disease with early onset: Secondary | ICD-10-CM | POA: Diagnosis not present

## 2024-04-25 DIAGNOSIS — R41841 Cognitive communication deficit: Secondary | ICD-10-CM | POA: Diagnosis not present

## 2024-04-25 DIAGNOSIS — F902 Attention-deficit hyperactivity disorder, combined type: Secondary | ICD-10-CM | POA: Diagnosis not present

## 2024-04-25 DIAGNOSIS — I10 Essential (primary) hypertension: Secondary | ICD-10-CM | POA: Diagnosis not present

## 2024-04-25 DIAGNOSIS — E785 Hyperlipidemia, unspecified: Secondary | ICD-10-CM | POA: Diagnosis not present

## 2024-04-25 DIAGNOSIS — Z23 Encounter for immunization: Secondary | ICD-10-CM | POA: Diagnosis not present

## 2024-04-25 DIAGNOSIS — K219 Gastro-esophageal reflux disease without esophagitis: Secondary | ICD-10-CM | POA: Diagnosis not present

## 2024-04-25 DIAGNOSIS — J8410 Pulmonary fibrosis, unspecified: Secondary | ICD-10-CM | POA: Diagnosis not present

## 2024-04-25 DIAGNOSIS — N1831 Chronic kidney disease, stage 3a: Secondary | ICD-10-CM | POA: Diagnosis not present

## 2024-04-25 DIAGNOSIS — F325 Major depressive disorder, single episode, in full remission: Secondary | ICD-10-CM | POA: Diagnosis not present

## 2024-04-25 MED ORDER — TRAMADOL HCL 50 MG PO TABS: 50.0000 mg | ORAL_TABLET | Freq: Two times a day (BID) | ORAL | 0 refills | Status: DC | PRN

## 2024-04-25 NOTE — Telephone Encounter (Signed)
 This encounter was created in error - please disregard.

## 2024-04-25 NOTE — Telephone Encounter (Signed)
 Mast, Man X, NP to Me (Selected Message)     04/25/24 12:49 PM Re-sent: Tramadol  50mg  q12hrs prn, #60 Thanks.   I left a detailed message for patient explaining the above

## 2024-04-25 NOTE — Telephone Encounter (Signed)
 Copied from CRM [id number]. Topic: Clinical - Medication Refill >> Apr 24, 2024 10:51 AM Chiquita SQUIBB wrote: Medication:  benzonatate  benzonatate  (TESSALON ) 200 MG capsule  traMADol  (ULTRAM ) 50 MG tablet  Patient stated that last time she received 30 days worth and the patient usually takes them both one in the morning and one in the evening so she needs more than the 30 days worth and that is not how it was prescribed in the past, as she used to get 3 X 30 on both medications.   Has the patient contacted their pharmacy? Yes (Agent: If no, request that the patient contact the pharmacy for the refill. If patient does not wish to contact the pharmacy document the reason why and proceed with request.) (Agent: If yes, when and what did the pharmacy advise?)  This is the patient's preferred pharmacy:  [facility] [place], [place] - [address] [facility] [address] [place] [address] Phone: [phone] Fax: [phone]  Is this the correct pharmacy for this prescription? Yes If no, delete pharmacy and type the correct one.   Has the prescription been filled recently? No  Is the patient out of the medication? No  Has the patient been seen for an appointment in the last year OR does the patient have an upcoming appointment? Yes  Can we respond through MyChart? Yes  Agent: Please be advised that Rx refills may take up to 3 business days. We ask that you follow-up with your pharmacy.

## 2024-04-26 DIAGNOSIS — M2550 Pain in unspecified joint: Secondary | ICD-10-CM | POA: Diagnosis not present

## 2024-04-26 DIAGNOSIS — R41841 Cognitive communication deficit: Secondary | ICD-10-CM | POA: Diagnosis not present

## 2024-04-26 DIAGNOSIS — M6281 Muscle weakness (generalized): Secondary | ICD-10-CM | POA: Diagnosis not present

## 2024-04-29 DIAGNOSIS — M6281 Muscle weakness (generalized): Secondary | ICD-10-CM | POA: Diagnosis not present

## 2024-04-29 DIAGNOSIS — M2550 Pain in unspecified joint: Secondary | ICD-10-CM | POA: Diagnosis not present

## 2024-04-29 DIAGNOSIS — R41841 Cognitive communication deficit: Secondary | ICD-10-CM | POA: Diagnosis not present

## 2024-04-30 DIAGNOSIS — M79672 Pain in left foot: Secondary | ICD-10-CM | POA: Diagnosis not present

## 2024-04-30 DIAGNOSIS — Q6652 Congenital pes planus, left foot: Secondary | ICD-10-CM | POA: Diagnosis not present

## 2024-04-30 DIAGNOSIS — L84 Corns and callosities: Secondary | ICD-10-CM | POA: Diagnosis not present

## 2024-05-01 DIAGNOSIS — R41841 Cognitive communication deficit: Secondary | ICD-10-CM | POA: Diagnosis not present

## 2024-05-01 DIAGNOSIS — M6281 Muscle weakness (generalized): Secondary | ICD-10-CM | POA: Diagnosis not present

## 2024-05-01 DIAGNOSIS — M2550 Pain in unspecified joint: Secondary | ICD-10-CM | POA: Diagnosis not present

## 2024-05-08 ENCOUNTER — Encounter: Payer: Self-pay | Admitting: Internal Medicine

## 2024-05-08 ENCOUNTER — Non-Acute Institutional Stay: Admitting: Internal Medicine

## 2024-05-08 VITALS — BP 116/64 | HR 81 | Temp 97.1°F | Ht 66.0 in | Wt 155.1 lb

## 2024-05-08 DIAGNOSIS — K219 Gastro-esophageal reflux disease without esophagitis: Secondary | ICD-10-CM

## 2024-05-08 DIAGNOSIS — I2699 Other pulmonary embolism without acute cor pulmonale: Secondary | ICD-10-CM | POA: Diagnosis not present

## 2024-05-08 DIAGNOSIS — J45991 Cough variant asthma: Secondary | ICD-10-CM

## 2024-05-08 DIAGNOSIS — I959 Hypotension, unspecified: Secondary | ICD-10-CM

## 2024-05-08 DIAGNOSIS — G309 Alzheimer's disease, unspecified: Secondary | ICD-10-CM | POA: Diagnosis not present

## 2024-05-08 DIAGNOSIS — F0280 Dementia in other diseases classified elsewhere without behavioral disturbance: Secondary | ICD-10-CM | POA: Diagnosis not present

## 2024-05-08 DIAGNOSIS — E0859 Diabetes mellitus due to underlying condition with other circulatory complications: Secondary | ICD-10-CM

## 2024-05-10 NOTE — Progress Notes (Unsigned)
 Location:  [facility] Curator of Service:   Clinic  Provider:   Code Status:  Goals of Care:     11/27/2023   10:48 AM  Advanced Directives  Does Patient Have a Medical Advance Directive? Yes  Type of Estate Agent of [place];Living will  Does patient want to make changes to medical advance directive? No - Patient declined  Copy of Healthcare Power of Attorney in Chart? No - copy requested  Would patient like information on creating a medical advance directive? No - Patient declined     Chief Complaint  Patient presents with  . Medical Management of Chronic Issues    Routine Check-Up, needs medicare annual wellness visit, diabetic kidney evaluation-urine ACR, foot and eye exam, hemoglobin A1c and covid vaccine.( Orders are pending for Dr. Charlanne to sign)    HPI: Patient is a 73 y.o. female seen today for medical management of chronic diseases.     Past Medical History:  Diagnosis Date  . Anxiety   . Arthritis   . Asthma   . Avascular necrosis of talus (HCC)   . Cancer (HCC)    vulva pre cancer had surgery  . Depression   . Gait abnormality 03/20/2017  . GERD (gastroesophageal reflux disease)   . Hearing loss    very minor  . History of pneumonia   . Hyperlipidemia   . Hypertension   . Leg edema    left  . Memory difficulty 03/20/2017  . PONV (postoperative nausea and vomiting)   . Stress incontinence     Past Surgical History:  Procedure Laterality Date  . ANKLE FUSION  2011   right multiple   . ANKLE FUSION     rear ankle fusion  . ANKLE SURGERY  2010   right cordicompression  . AORTIC ARCH ANGIOGRAPHY N/A 12/11/2017   Procedure: AORTIC ARCH ANGIOGRAPHY;  Surgeon: Harvey Carlin BRAVO, MD;  [facility];  Service: Cardiovascular;  Laterality: N/A;  . APPENDECTOMY  05/10/2016  . BELOW KNEE LEG AMPUTATION    . CATARACT EXTRACTION W/ INTRAOCULAR LENS IMPLANT Left   . LAPAROSCOPIC APPENDECTOMY N/A 05/10/2016    Procedure: APPENDECTOMY LAPAROSCOPIC;  Surgeon: Vicenta Poli, MD;  Location: [facility] OR;  Service: General;  Laterality: N/A;  . LUMBAR LAMINECTOMY/DECOMPRESSION MICRODISCECTOMY N/A 09/09/2015   Procedure:  L4-S1 Decompression/ Discetomy;  Surgeon: Donaciano Sprang, MD;  Location: [facility] OR;  Service: Orthopedics;  Laterality: N/A;  . SKIN GRAFT    . SPINAL CORD STIMULATOR BATTERY EXCHANGE  06/23/2023  . SPINAL CORD STIMULATOR INSERTION N/A 01/10/2019   Procedure: LUMBAR SPINAL CORD STIMULATOR INSERTION;  Surgeon: Sprang Donaciano, MD;  Location: [facility] OR;  Service: Orthopedics;  Laterality: N/A;  2.5 hrs  . TONSILLECTOMY    . TUBAL LIGATION  1983  . VULVECTOMY N/A 01/28/2015   Procedure: WIDE EXCISION VULVECTOMY;  Surgeon: Hargis Paradise, MD;  Location: [facility] ORS;  Service: Gynecology;  Laterality: N/A;    Allergies  Allergen Reactions  . Delsym [Dextromethorphan] Other (See Comments)    Cognitive impairment    Outpatient Encounter Medications as of 05/08/2024  Medication Sig  . acetaminophen  (TYLENOL ) 325 MG tablet Take 650 mg by mouth as needed.  SABRA acyclovir cream (ZOVIRAX) 5 % Apply 1 Application topically 2 (two) times daily as needed (cold sore).  . albuterol  (PROAIR  HFA) 108 (90 Base) MCG/ACT inhaler 2 puffs every 4 hours as needed only  if your can't catch your breath  .  benzonatate  (TESSALON ) 200 MG capsule TAKE ONE CAPSULE BY MOUTH THREE TIMES DAILY AS NEEDED FOR COUGH  . buPROPion  (WELLBUTRIN  XL) 300 MG 24 hr tablet Take 1 tablet (300 mg total) by mouth daily.  . calcium  carbonate (OSCAL) 1500 (600 Ca) MG TABS tablet Take 1 tablet (1,500 mg total) by mouth at bedtime.  . denosumab  (PROLIA ) 60 MG/ML SOLN injection Inject 60 mg into the skin every 6 (six) months.   . donepezil  (ARICEPT ) 10 MG tablet Take 1 tablet (10 mg total) by mouth at bedtime.  . ELIQUIS  5 MG TABS tablet TAKE ONE TABLET BY MOUTH TWICE DAILY  . FLUoxetine  (PROZAC ) 20 MG capsule TAKE ONE CAPSULE BY MOUTH ONCE DAILY  .  fluticasone  (FLONASE ) 50 MCG/ACT nasal spray Place 2 sprays into both nostrils daily. (Patient taking differently: Place 2 sprays into both nostrils as needed.)  . hydrocortisone  2.5 % cream Apply topically 2 (two) times daily.  SABRA ipratropium (ATROVENT ) 0.06 % nasal spray Place 2 sprays into both nostrils 3 (three) times daily.  SABRA ipratropium-albuterol  (DUONEB) 0.5-2.5 (3) MG/3ML SOLN Take 3 mLs by nebulization every 6 (six) hours as needed (Shortness of breath, cough and/or wheeze). Use this or albuterol  inhaler but not both.  . melatonin 3 MG TABS tablet Take 3 mg by mouth at bedtime.  . memantine  (NAMENDA ) 10 MG tablet Take 1 tablet (10 mg total) by mouth 2 (two) times daily.  . midodrine  (PROAMATINE ) 5 MG tablet TAKE ONE TABLET BY MOUTH DAILY  . montelukast  (SINGULAIR ) 10 MG tablet TAKE ONE TABLET BY MOUTH AT BEDTIME  . Multiple Vitamins-Minerals (CENTRUM SILVER WOMEN 50+) TABS Take 1 tablet by mouth daily.  SABRA nystatin cream (MYCOSTATIN) Apply 1 Application topically.  . OXYGEN  Inhale into the lungs. O2 2L, Monitor O2 sat Qshift to maintain O2 Sat >95% every shift  . pantoprazole  (PROTONIX ) 40 MG tablet TAKE 30 TO 60 MINUTES BEFORE THE FIRST AND LAST MEAL OF THE DAY  . Potassium 99 MG TABS Take 1 tablet by mouth daily.  . rosuvastatin  (CRESTOR ) 10 MG tablet Take 1 tablet (10 mg total) by mouth every evening.  . traMADol  (ULTRAM ) 50 MG tablet Take 1 tablet (50 mg total) by mouth every 12 (twelve) hours as needed for moderate pain (pain score 4-6).  . Turmeric Curcumin 500 MG CAPS 1000 mg 2 tablets Orally once a day   No facility-administered encounter medications on file as of 05/08/2024.    Review of Systems:  Review of Systems  Health Maintenance  Topic Date Due  . FOOT EXAM  Never done  . Diabetic kidney evaluation - Urine ACR  Never done  . Hepatitis C Screening  Never done  . OPHTHALMOLOGY EXAM  05/10/2022  . Medicare Annual Wellness (AWV)  03/29/2024  . HEMOGLOBIN A1C   04/27/2024  . COVID-19 Vaccine (6 - 2025-26 season) 05/24/2024 (Originally 02/05/2024)  . Diabetic kidney evaluation - eGFR measurement  10/25/2024  . Colonoscopy  03/22/2026  . DTaP/Tdap/Td (5 - Td or Tdap) 06/24/2033  . Pneumococcal Vaccine: 50+ Years  Completed  . Influenza Vaccine  Completed  . Bone Density Scan  Completed  . Zoster Vaccines- Shingrix  Completed  . Meningococcal B Vaccine  Aged Out  . Mammogram  Discontinued    Physical Exam: Vitals:   05/08/24 1328  BP: 116/64  Pulse: 81  Temp: (!) 97.1 F (36.2 C)  TempSrc: Temporal  SpO2: 95%  Weight: 155 lb 1.6 oz (70.4 kg)  Height: 5' 6 (1.676  m)   Body mass index is 25.03 kg/m. Physical Exam  Labs reviewed: Basic Metabolic Panel: Recent Labs    08/30/23 1745 08/31/23 0801 09/03/23 0242 09/04/23 0357 09/05/23 0330 09/06/23 0404 09/11/23 0541 09/23/23 9561 09/25/23 0436 09/28/23 0450 10/10/23 0000 10/26/23 0000  NA  --    < >  --  138 140 139   < > 136 137 135 140 140  K  --    < >  --  3.2* 3.2* 3.7   < > 4.0 4.2 3.9 4.3 4.3  CL  --    < >  --  101 104 105   < > 96* 99 96* 103 103  CO2  --    < >  --  26 27 22    < > 29 32 28 31* 31*  GLUCOSE  --    < >  --  150* 117* 95   < > 84 81 92  --   --   BUN  --    < >  --  12 15 16    < > 16 14 20  33* 15  CREATININE 1.18*   < >  --  0.68 0.66 0.73   < > 0.57 0.62 0.69 0.5 0.6  CALCIUM   --    < >  --  7.6* 7.8* 7.9*   < > 8.5* 8.9 8.9 8.3* 8.7  MG  --    < > 1.8 2.0 2.2 1.8  --   --  2.0 1.9  --   --   PHOS  --   --  2.7 2.9 2.3*  --   --   --   --   --   --   --   TSH 1.153  --   --   --   --   --   --   --   --   --   --  0.74   < > = values in this interval not displayed.   Liver Function Tests: Recent Labs    09/03/23 0200 09/05/23 0330 09/06/23 0404 09/11/23 0541 10/26/23 0000  AST 53*  --  42* 29 19  ALT 46*  --  63* 35 15  ALKPHOS 64  --  57 71  --   BILITOT 1.2  --  0.7 0.9  --   PROT 5.9*  --  5.4* 6.0*  --   ALBUMIN  2.5*   < > 2.2* 2.4*  3.2*   < > = values in this interval not displayed.   No results for input(s): LIPASE, AMYLASE in the last 8760 hours. No results for input(s): AMMONIA in the last 8760 hours. CBC: Recent Labs    09/23/23 0438 09/25/23 0436 09/28/23 0450 10/10/23 0000 10/17/23 0000 10/26/23 0000  WBC 12.9* 13.2* 16.1* 8.9 6.6 6.0  NEUTROABS  --  9.3* 12.6*  --  3,478.00 3,324.00  HGB 9.6* 10.1* 11.0* 10.0* 10.0* 10.1*  HCT 29.7* 31.4* 33.4* 31* 32* 33*  MCV 96.4 96.3 95.4  --   --   --   PLT 310 314 322 234 276 327   Lipid Panel: Recent Labs    10/26/23 0000  CHOL 139  HDL 56  LDLCALC 69  TRIG 62   Lab Results  Component Value Date   HGBA1C 5.8 10/26/2023    Procedures since last visit: No results found.  Assessment/Plan 1. Cough variant asthma (Primary) ***  2. Hypotension, unspecified hypotension type ***  3. Gastroesophageal  reflux disease, unspecified whether esophagitis present ***  4. Alzheimer's dementia, unspecified dementia severity, unspecified timing of dementia onset, unspecified whether behavioral, psychotic, or mood disturbance or anxiety (HCC) ***  5. Other pulmonary embolism without acute cor pulmonale, unspecified chronicity (HCC) ***    Labs/tests ordered:  * No order type specified * Next appt:  09/05/2024

## 2024-05-11 ENCOUNTER — Other Ambulatory Visit: Payer: Self-pay | Admitting: Nurse Practitioner

## 2024-05-16 ENCOUNTER — Encounter: Payer: Self-pay | Admitting: Nurse Practitioner

## 2024-05-21 DIAGNOSIS — H5203 Hypermetropia, bilateral: Secondary | ICD-10-CM | POA: Diagnosis not present

## 2024-05-21 DIAGNOSIS — H16141 Punctate keratitis, right eye: Secondary | ICD-10-CM | POA: Diagnosis not present

## 2024-05-21 DIAGNOSIS — H52223 Regular astigmatism, bilateral: Secondary | ICD-10-CM | POA: Diagnosis not present

## 2024-05-21 DIAGNOSIS — H53141 Visual discomfort, right eye: Secondary | ICD-10-CM | POA: Diagnosis not present

## 2024-05-21 DIAGNOSIS — H524 Presbyopia: Secondary | ICD-10-CM | POA: Diagnosis not present

## 2024-05-22 ENCOUNTER — Other Ambulatory Visit: Payer: Self-pay | Admitting: Nurse Practitioner

## 2024-05-22 ENCOUNTER — Other Ambulatory Visit: Payer: Self-pay | Admitting: Sports Medicine

## 2024-05-22 DIAGNOSIS — J45991 Cough variant asthma: Secondary | ICD-10-CM

## 2024-05-22 NOTE — Telephone Encounter (Signed)
 Patient is requesting a refill of the following medications: Requested Prescriptions   Pending Prescriptions Disp Refills   traMADol  (ULTRAM ) 50 MG tablet [Pharmacy Med Name: tramadol  50 mg tablet] 60 tablet 0    Sig: Take 1 tablet (50 mg total) by mouth every 12 (twelve) hours as needed for moderate pain (pain score 4-6).   benzonatate  (TESSALON ) 200 MG capsule [Pharmacy Med Name: benzonatate  200 mg capsule] 30 capsule 0    Sig: TAKE ONE CAPSULE BY MOUTH THREE TIMES DAILY AS NEEDED FOR COUGH    Date of last refill: Tramadol , 04/25/24  Refill amount: 60  Treatment agreement date: September 2025

## 2024-05-23 ENCOUNTER — Other Ambulatory Visit: Payer: Self-pay | Admitting: Nurse Practitioner

## 2024-05-23 DIAGNOSIS — Z860100 Personal history of colon polyps, unspecified: Secondary | ICD-10-CM | POA: Diagnosis not present

## 2024-05-23 DIAGNOSIS — Z7901 Long term (current) use of anticoagulants: Secondary | ICD-10-CM | POA: Diagnosis not present

## 2024-05-23 DIAGNOSIS — K5900 Constipation, unspecified: Secondary | ICD-10-CM | POA: Diagnosis not present

## 2024-05-23 NOTE — Telephone Encounter (Signed)
 High risk warning populated when attempting to refill medications, will send to provider for pending  review and approval

## 2024-05-24 ENCOUNTER — Other Ambulatory Visit: Payer: Self-pay | Admitting: Gastroenterology

## 2024-05-24 DIAGNOSIS — Z860100 Personal history of colon polyps, unspecified: Secondary | ICD-10-CM

## 2024-05-31 ENCOUNTER — Other Ambulatory Visit: Payer: Self-pay | Admitting: Sports Medicine

## 2024-05-31 ENCOUNTER — Other Ambulatory Visit: Payer: Self-pay | Admitting: Nurse Practitioner

## 2024-05-31 DIAGNOSIS — M159 Polyosteoarthritis, unspecified: Secondary | ICD-10-CM

## 2024-05-31 NOTE — Telephone Encounter (Signed)
 Pharmacy requested refill.  Pended Rx and sent to [facility] for approval.  Last Rx was set to Print

## 2024-06-05 ENCOUNTER — Telehealth: Payer: Self-pay | Admitting: Orthopedic Surgery

## 2024-06-05 NOTE — Telephone Encounter (Signed)
 The pt has not been seen since January this year. Insurance will require a face to face visit with in 6 months to honor rx. Can you please call the pt and make and appt at the next available with [facility] or [facility]. thanks

## 2024-06-05 NOTE — Telephone Encounter (Signed)
 Pt called saying that she needs new sleeves for her prosthesis. She says that she has lost a lot of weight which makes the prosthesis not fir right. She is wondering if she needs to come in and be re fit for the prosthesis. Call back number is [phone].

## 2024-06-07 ENCOUNTER — Encounter: Payer: Self-pay | Admitting: Physician Assistant

## 2024-06-07 ENCOUNTER — Ambulatory Visit: Admitting: Physician Assistant

## 2024-06-07 DIAGNOSIS — Z89511 Acquired absence of right leg below knee: Secondary | ICD-10-CM | POA: Diagnosis not present

## 2024-06-07 NOTE — Progress Notes (Signed)
 "  Office Visit Note   Patient: Shelly [name]           Date of Birth: [birth year]           MRN: [id number] Visit Date: 06/07/2024              Requested by: Mast, Man X, NP [address] [place],  [place] [address] PCP: Mast, Man X, NP  Chief Complaint  Patient presents with   Right Leg - Follow-up      HPI: 74 y/o female with history of right BKA.  She is here her current liner is worn out.  She has noticed that her leg sinks further in the socket and her leg is smaller.  She feels like the prothesis is spongy feeling.  The sleeves she uses have tears.    Recent history of Respiratory Failure with COPD  Was admitted in the hospital  in 03/25 -09/2023 She had acute respiratory failure with rhinovirus and pneumonia infection Also had PE Now on Eliquis  and Chronic Oxygen    Assessment & Plan: Visit Diagnoses:  1. History of right below knee amputation (HCC)     Plan: Given an order for new prosthesis supplies. She will follow-up as needed.   Follow-Up Instructions: Return if symptoms worsen or fail to improve.   Ortho Exam  Patient is alert, oriented, no adenopathy, well-dressed, normal affect, normal respiratory effort. No open wounds or superficial wounds on the right stump.  No edema or cellulitis.     Patient currently requires mobility aids to ambulate without a prosthesis. Expects not to use mobility aids with a new prosthesis.   Patient is a K3 level ambulator that spends a lot of time walking around on uneven terrain over obstacles, up and down stairs, and ambulates with a variable cadence.     Imaging: No results found. No images are attached to the encounter.  Labs: Lab Results  Component Value Date   HGBA1C 5.8 10/26/2023   ESRSEDRATE 52 (H) 09/11/2023   ESRSEDRATE 68 (H) 09/05/2023   ESRSEDRATE 2 03/20/2017   CRP 0.7 09/25/2023   CRP 1.6 (H) 09/23/2023   CRP 1.1 (H) 09/18/2023   LABURIC 2.9 08/30/2023   REPTSTATUS 09/13/2023 FINAL 09/11/2023    REPTSTATUS 09/16/2023 FINAL 09/11/2023   GRAMSTAIN  09/11/2023    NO WBC SEEN RARE BUDDING YEAST SEEN Performed at [facility], [address], [place], [place] [address]    CULT  09/11/2023    FEW KLEBSIELLA PNEUMONIAE ABUNDANT CANDIDA ALBICANS    LABORGA KLEBSIELLA PNEUMONIAE 09/11/2023     Lab Results  Component Value Date   ALBUMIN  3.2 (A) 10/26/2023   ALBUMIN  2.4 (L) 09/11/2023   ALBUMIN  2.2 (L) 09/06/2023    Lab Results  Component Value Date   MG 1.9 09/28/2023   MG 2.0 09/25/2023   MG 1.8 09/06/2023   Lab Results  Component Value Date   VD25OH 117.47 (H) 09/03/2023    No results found for: PREALBUMIN    Latest Ref Rng & Units 10/26/2023   12:00 AM 10/17/2023   12:00 AM 10/10/2023   12:00 AM  CBC EXTENDED  WBC  6.0     6.6     8.9      RBC 3.87 - 5.11 3.39     3.29     3.30      Hemoglobin 12.0 - 16.0 10.1     10.0     10.0  HCT 36 - 46 33     32     31      Platelets 150 - 400 K/uL 327     276     234      NEUT#  3,324.00     3,478.00          This result is from an external source.     There is no height or weight on file to calculate BMI.  Orders:  No orders of the defined types were placed in this encounter.  No orders of the defined types were placed in this encounter.    Procedures: No procedures performed  Clinical Data: No additional findings.  ROS:  All other systems negative, except as noted in the HPI. Review of Systems  Objective: Vital Signs: LMP  (LMP Unknown)   Specialty Comments:  No specialty comments available.  PMFS History: Patient Active Problem List   Diagnosis Date Noted   Osteoporosis 03/07/2024   Interstitial lung disease (HCC) 11/23/2023   Osteoarthritis, multiple sites 11/15/2023   Dermatitis 11/14/2023   Hypotension 11/14/2023   History of osteoporosis 11/14/2023   Diabetes due to underlying condition w oth circulatory comp (HCC) 10/23/2023   Slow transit constipation 10/23/2023    Chronic anemia 10/23/2023   Sepsis (HCC) 09/11/2023   Chest pain 09/11/2023   Acute pulmonary embolism (HCC) 09/11/2023   Diastolic dysfunction 09/11/2023   Alzheimer's dementia (HCC) 09/11/2023   ARDS (adult respiratory distress syndrome) (HCC) 09/11/2023   Chronic respiratory failure with hypoxia (HCC) 09/04/2023   Multifocal pneumonia 08/30/2023   Anxiety and depression 05/19/2022   Acute bronchitis 04/11/2022   GERD (gastroesophageal reflux disease) 02/22/2022   Lumbar radiculopathy 09/14/2021   Upper airway cough syndrome 03/26/2019   Chronic pain 01/10/2019   MCI (mild cognitive impairment) 03/20/2017   Gait abnormality 03/20/2017   History of right below knee amputation (HCC) 07/11/2016   Adenomatous colon polyp 05/10/2016   Coronary artery calcification 01/07/2016   Essential hypertension 01/07/2016   Back pain 09/09/2015   Abnormal CXR 05/16/2014   Cough variant asthma ? assoc with UACS 05/15/2014   Acute post-operative pain 02/18/2012   Adiposity 02/17/2012   Does use eyeglasses 02/13/2012   H/O arthrodesis 10/21/2011   Fibromyalgia 04/11/2011   Hyperlipidemia 04/11/2011   Avascular necrosis of talus (HCC) 04/11/2011   Osteomyelitis of ankle (HCC) 04/11/2011   Leg edema 09/08/2010   Past Medical History:  Diagnosis Date   Anxiety    Arthritis    Asthma    Avascular necrosis of talus (HCC)    Cancer (HCC)    vulva pre cancer had surgery   Depression    Gait abnormality 03/20/2017   GERD (gastroesophageal reflux disease)    Hearing loss    very minor   History of pneumonia    Hyperlipidemia    Hypertension    Leg edema    left   Memory difficulty 03/20/2017   PONV (postoperative nausea and vomiting)    Stress incontinence     Family History  Problem Relation Age of Onset   Dementia Mother 30       alive   Fibromyalgia Mother    Allergies Mother    Heart attack Father 55       deceased   Multiple myeloma Father    Heart murmur Sister 63        she had open heart surgery   Other Sister 6  She is bIpolar- diabetic   Breast cancer Sister    Other Brother        alive   Heart disease Brother        emergent CABG for 99% blocked CAD    Past Surgical History:  Procedure Laterality Date   ANKLE FUSION  2011   right multiple    ANKLE FUSION     rear ankle fusion   ANKLE SURGERY  2010   right cordicompression   AORTIC ARCH ANGIOGRAPHY N/A 12/11/2017   Procedure: AORTIC ARCH ANGIOGRAPHY;  Surgeon: Harvey Carlin BRAVO, MD;  [facility];  Service: Cardiovascular;  Laterality: N/A;   APPENDECTOMY  05/10/2016   BELOW KNEE LEG AMPUTATION     CATARACT EXTRACTION W/ INTRAOCULAR LENS IMPLANT Left    LAPAROSCOPIC APPENDECTOMY N/A 05/10/2016   Procedure: APPENDECTOMY LAPAROSCOPIC;  Surgeon: Vicenta Poli, MD;  Location: [facility] OR;  Service: General;  Laterality: N/A;   LUMBAR LAMINECTOMY/DECOMPRESSION MICRODISCECTOMY N/A 09/09/2015   Procedure:  L4-S1 Decompression/ Discetomy;  Surgeon: Donaciano Sprang, MD;  Location: [facility] OR;  Service: Orthopedics;  Laterality: N/A;   SKIN GRAFT     SPINAL CORD STIMULATOR BATTERY EXCHANGE  06/23/2023   SPINAL CORD STIMULATOR INSERTION N/A 01/10/2019   Procedure: LUMBAR SPINAL CORD STIMULATOR INSERTION;  Surgeon: Sprang Donaciano, MD;  Location: [facility] OR;  Service: Orthopedics;  Laterality: N/A;  2.5 hrs   TONSILLECTOMY     TUBAL LIGATION  1983   VULVECTOMY N/A 01/28/2015   Procedure: WIDE EXCISION VULVECTOMY;  Surgeon: Hargis Paradise, MD;  Location: [facility] OR[facility];  Service: Gynecology;  Laterality: N/A;   Social History   Occupational History   Occupation: ACCOUNTING    Employer: [employer]  Tobacco Use   Smoking status: Former    Current packs/day: 0.00    Average packs/day: 1 pack/day for 40.0 years (40.0 ttl pk-yrs)    Types: Cigarettes    Start date: 02/05/1967    Quit date: 02/05/2007    Years since quitting: 17.3   Smokeless tobacco: Never  Vaping Use   Vaping status: Never Used   Substance and Sexual Activity   Alcohol  use: Yes    Alcohol /week: 0.0 standard drinks of alcohol     Comment: socially   Drug use: No   Sexual activity: Not on file       "

## 2024-06-13 ENCOUNTER — Other Ambulatory Visit: Payer: Self-pay | Admitting: Nurse Practitioner

## 2024-06-13 DIAGNOSIS — J45991 Cough variant asthma: Secondary | ICD-10-CM

## 2024-06-13 DIAGNOSIS — M159 Polyosteoarthritis, unspecified: Secondary | ICD-10-CM

## 2024-06-13 MED ORDER — BENZONATATE 200 MG PO CAPS: 200.0000 mg | ORAL_CAPSULE | Freq: Two times a day (BID) | ORAL | 2 refills | Status: AC

## 2024-06-13 MED ORDER — TRAMADOL HCL 50 MG PO TABS: 50.0000 mg | ORAL_TABLET | Freq: Two times a day (BID) | ORAL | 1 refills | Status: AC

## 2024-06-19 ENCOUNTER — Other Ambulatory Visit: Payer: Self-pay | Admitting: Nurse Practitioner

## 2024-06-20 ENCOUNTER — Ambulatory Visit
Admission: RE | Admit: 2024-06-20 | Discharge: 2024-06-20 | Disposition: A | Source: Ambulatory Visit | Attending: Gastroenterology | Admitting: Gastroenterology

## 2024-06-20 DIAGNOSIS — Z860100 Personal history of colon polyps, unspecified: Secondary | ICD-10-CM

## 2024-06-21 ENCOUNTER — Telehealth: Payer: Self-pay

## 2024-06-21 MED ORDER — ALBUTEROL SULFATE HFA 108 (90 BASE) MCG/ACT IN AERS: INHALATION_SPRAY | RESPIRATORY_TRACT | 3 refills | Status: AC

## 2024-06-21 NOTE — Telephone Encounter (Signed)
 Copied from CRM [id number]. Topic: Clinical - Medication Refill >> Jun 21, 2024  8:47 AM Carrielelia G wrote: Medication: albuterol  (VENTOLIN  HFA) 108 (90 Base) MCG/ACT inhaler  Has the patient contacted their pharmacy? No (Agent: If no, request that the patient contact the pharmacy for the refill. If patient does not wish to contact the pharmacy document the reason why and proceed with request.) (Agent: If yes, when and what did the pharmacy advise?)  This is the patient's preferred pharmacy:  [facility] [place], [place] - [address] [place] [address] Phone: [phone] Fax: [phone]  Is this the correct pharmacy for this prescription? Yes If no, delete pharmacy and type the correct one.   Is the patient out of the medication? No  Has the patient been seen for an appointment in the last year OR does the patient have an upcoming appointment? Yes  Can we respond through MyChart? No  Agent: Please be advised that Rx refills may take up to 3 business days. We ask that you follow-up with your pharmacy.

## 2024-06-26 ENCOUNTER — Other Ambulatory Visit: Payer: Self-pay | Admitting: Nurse Practitioner

## 2024-07-12 ENCOUNTER — Other Ambulatory Visit: Payer: Self-pay | Admitting: Nurse Practitioner

## 2024-07-16 ENCOUNTER — Encounter

## 2024-07-16 ENCOUNTER — Ambulatory Visit: Admitting: Internal Medicine

## 2024-09-05 ENCOUNTER — Encounter: Admitting: Nurse Practitioner

## 2024-09-19 ENCOUNTER — Ambulatory Visit: Admitting: Neurology

## 2024-10-11 ENCOUNTER — Ambulatory Visit: Admitting: Internal Medicine

## 2024-10-11 ENCOUNTER — Encounter

## 2024-10-15 ENCOUNTER — Ambulatory Visit: Admitting: Neurology
